# Patient Record
Sex: Male | Born: 1955 | Race: Black or African American | Hispanic: No | Marital: Single | Smoking: Current some day smoker
Health system: Southern US, Community
[De-identification: ages and names within clinical notes are randomized; demographics above are authoritative.]

## PROBLEM LIST (undated history)

## (undated) DIAGNOSIS — Z9989 Dependence on other enabling machines and devices: Secondary | ICD-10-CM

## (undated) DIAGNOSIS — F329 Major depressive disorder, single episode, unspecified: Secondary | ICD-10-CM

## (undated) DIAGNOSIS — I10 Essential (primary) hypertension: Secondary | ICD-10-CM

## (undated) DIAGNOSIS — C801 Malignant (primary) neoplasm, unspecified: Secondary | ICD-10-CM

## (undated) DIAGNOSIS — E785 Hyperlipidemia, unspecified: Secondary | ICD-10-CM

## (undated) DIAGNOSIS — R0600 Dyspnea, unspecified: Secondary | ICD-10-CM

## (undated) DIAGNOSIS — K759 Inflammatory liver disease, unspecified: Secondary | ICD-10-CM

## (undated) DIAGNOSIS — C3490 Malignant neoplasm of unspecified part of unspecified bronchus or lung: Secondary | ICD-10-CM

## (undated) DIAGNOSIS — M1990 Unspecified osteoarthritis, unspecified site: Secondary | ICD-10-CM

## (undated) DIAGNOSIS — F32A Depression, unspecified: Secondary | ICD-10-CM

## (undated) DIAGNOSIS — N189 Chronic kidney disease, unspecified: Secondary | ICD-10-CM

## (undated) DIAGNOSIS — K219 Gastro-esophageal reflux disease without esophagitis: Secondary | ICD-10-CM

## (undated) DIAGNOSIS — Z87442 Personal history of urinary calculi: Secondary | ICD-10-CM

## (undated) DIAGNOSIS — F4310 Post-traumatic stress disorder, unspecified: Secondary | ICD-10-CM

## (undated) DIAGNOSIS — G629 Polyneuropathy, unspecified: Secondary | ICD-10-CM

## (undated) DIAGNOSIS — G4733 Obstructive sleep apnea (adult) (pediatric): Secondary | ICD-10-CM

## (undated) DIAGNOSIS — E119 Type 2 diabetes mellitus without complications: Secondary | ICD-10-CM

## (undated) HISTORY — PX: COLONOSCOPY: SHX174

## (undated) HISTORY — DX: Essential (primary) hypertension: I10

## (undated) HISTORY — PX: OTHER SURGICAL HISTORY: SHX169

## (undated) HISTORY — DX: Type 2 diabetes mellitus without complications: E11.9

## (undated) HISTORY — DX: Chronic kidney disease, unspecified: N18.9

## (undated) HISTORY — DX: Obstructive sleep apnea (adult) (pediatric): Z99.89

## (undated) HISTORY — PX: URETERAL STENT PLACEMENT: SHX822

## (undated) HISTORY — DX: Obstructive sleep apnea (adult) (pediatric): G47.33

## (undated) HISTORY — DX: Hyperlipidemia, unspecified: E78.5

## (undated) NOTE — *Deleted (*Deleted)
[facility] OFFICE PROGRESS NOTE  [name], MD [address] [place]  [place] [address]  DIAGNOSIS: ***  PRIOR THERAPY:  CURRENT THERAPY:  INTERVAL HISTORY: Kevin Lambert. 18 y.o. male returns for *** regular *** visit for followup of ***   MEDICAL HISTORY: Past Medical History:  Diagnosis Date  . Arthritis   . Chronic kidney disease   . Depression   . DM (diabetes mellitus) ([place])    type II  . Dyslipidemia   . Dyspnea    unable to walk much - he thinks it is from   . GERD (gastroesophageal reflux disease)   . Hepatitis    Hepatitis C- treated  . History of kidney stones   . HTN (hypertension)   . Neuropathy   . nscl ca dx'd 10/2019  . OSA on CPAP   . PTSD (post-traumatic stress disorder)     ALLERGIES:  is allergic to metformin and related and naproxen.  MEDICATIONS:  Current Outpatient Medications  Medication Sig Dispense Refill  . albuterol (VENTOLIN HFA) 108 (90 Base) MCG/ACT inhaler Inhale 2 puffs into the lungs every 4 (four) hours as needed for wheezing or shortness of breath. 18 g 0  . amLODipine (NORVASC) 10 MG tablet Take 10 mg by mouth daily.    . Ascorbic Acid (VITAMIN C) 1000 MG tablet Take 1,000 mg by mouth daily.    Marland Kitchen aspirin EC 81 MG tablet Take 81 mg by mouth daily.    Marland Kitchen atorvastatin (LIPITOR) 10 MG tablet Take 10 mg by mouth daily.    . bisacodyl (DULCOLAX) 5 MG EC tablet Take 5 mg by mouth daily as needed for moderate constipation.    . cholecalciferol (VITAMIN D3) 25 MCG (1000 UNIT) tablet Take 1,000 Units by mouth daily.    . Cyanocobalamin (VITAMIN B 12 PO) Take 1 tablet by mouth daily.    . folic acid (FOLVITE) 1 MG tablet Take 1 tablet (1 mg total) by mouth daily. Pt needs to pick up today and start ASAP . He will pay out of pocket. He is waiting on the [facility] [place] to send his folic acid which will be next week. (Patient taking differently: Take 1 mg by mouth daily. ) 7 tablet 0  . furosemide (LASIX) 20 MG tablet Take 1 tablet (20  mg total) by mouth 2 (two) times daily. (Patient taking differently: Take 20 mg by mouth 2 (two) times daily. Pt takes once a week) 20 tablet 0  . glipiZIDE (GLUCOTROL) 5 MG tablet Take 5 mg by mouth daily before breakfast.     . lidocaine-prilocaine (EMLA) cream Apply to Port-A-Cath site 30-60-minute before treatment. 30 g 0  . magic mouthwash SOLN Take 5 mLs by mouth 4 (four) times daily as needed for mouth pain. Swish and swallow or spit 240 mL 2  . nicotine (NICODERM CQ) 21 mg/24hr patch Place 1 patch (21 mg total) onto the skin daily. 28 patch 3  . nicotine polacrilex (COMMIT) 4 MG lozenge Take 1 lozenge (4 mg total) by mouth as needed for smoking cessation. 108 tablet 3  . potassium chloride SA (KLOR-CON) 20 MEQ tablet Take 1 tablet (20 mEq total) by mouth daily. 10 tablet 0  . pregabalin (LYRICA) 25 MG capsule Take 1 capsule (25 mg total) by mouth daily. 30 capsule 1  . Propylene Glycol (SYSTANE BALANCE OP) Place 1 drop into both eyes daily.    Marland Kitchen triamterene-hydrochlorothiazide (MAXZIDE-25) 37.5-25 MG tablet Take 1 tablet by mouth daily.     Marland Kitchen  umeclidinium-vilanterol (ANORO ELLIPTA) 62.5-25 MCG/INH AEPB Inhale 1 puff into the lungs daily. 60 each 11  . umeclidinium-vilanterol (ANORO ELLIPTA) 62.5-25 MCG/INH AEPB Inhale 1 puff into the lungs daily. 14 each 0  . zinc gluconate 50 MG tablet Take 50 mg by mouth daily.     No current facility-administered medications for this visit.    SURGICAL HISTORY:  Past Surgical History:  Procedure Laterality Date  . BIOPSY OF MEDIASTINAL MASS  12/10/2019   Procedure: BIOPSY OF MEDIASTINAL MASS;  Surgeon: Garner Nash, DO;  Location: [facility];  Service: Pulmonary;;  right upper lobe  . BRONCHIAL NEEDLE ASPIRATION BIOPSY  12/10/2019   Procedure: BRONCHIAL NEEDLE ASPIRATION BIOPSIES;  Surgeon: Garner Nash, DO;  Location: [facility];  Service: Pulmonary;;  . COLONOSCOPY    . IR IMAGING GUIDED PORT INSERTION  12/31/2019  . kidney stone    .  URETERAL STENT PLACEMENT    . Ureteral Stent Removed    . VIDEO BRONCHOSCOPY WITH ENDOBRONCHIAL ULTRASOUND N/A 12/10/2019   Procedure: VIDEO BRONCHOSCOPY WITH ENDOBRONCHIAL ULTRASOUND;  Surgeon: Garner Nash, DO;  Location: [facility];  Service: Pulmonary;  Laterality: N/A;    REVIEW OF SYSTEMS:   Review of Systems  Constitutional: Negative for appetite change, chills, fatigue, fever and unexpected weight change.  HENT:   Negative for mouth sores, nosebleeds, sore throat and trouble swallowing.   Eyes: Negative for eye problems and icterus.  Respiratory: Negative for cough, hemoptysis, shortness of breath and wheezing.   Cardiovascular: Negative for chest pain and leg swelling.  Gastrointestinal: Negative for abdominal pain, constipation, diarrhea, nausea and vomiting.  Genitourinary: Negative for bladder incontinence, difficulty urinating, dysuria, frequency and hematuria.   Musculoskeletal: Negative for back pain, gait problem, neck pain and neck stiffness.  Skin: Negative for itching and rash.  Neurological: Negative for dizziness, extremity weakness, gait problem, headaches, light-headedness and seizures.  Hematological: Negative for adenopathy. Does not bruise/bleed easily.  Psychiatric/Behavioral: Negative for confusion, depression and sleep disturbance. The patient is not nervous/anxious.     PHYSICAL EXAMINATION:  There were no vitals taken for this visit.  ECOG PERFORMANCE STATUS: {CHL ONC ECOG [id number]  Physical Exam  Constitutional: Oriented to person, place, and time and well-developed, well-nourished, and in no distress. No distress.  HENT:  Head: Normocephalic and atraumatic.  Mouth/Throat: Oropharynx is clear and moist. No oropharyngeal exudate.  Eyes: Conjunctivae are normal. Right eye exhibits no discharge. Left eye exhibits no discharge. No scleral icterus.  Neck: Normal range of motion. Neck supple.  Cardiovascular: Normal rate, regular rhythm, normal  heart sounds and intact distal pulses.   Pulmonary/Chest: Effort normal and breath sounds normal. No respiratory distress. No wheezes. No rales.  Abdominal: Soft. Bowel sounds are normal. Exhibits no distension and no mass. There is no tenderness.  Musculoskeletal: Normal range of motion. Exhibits no edema.  Lymphadenopathy:    No cervical adenopathy.  Neurological: Alert and oriented to person, place, and time. Exhibits normal muscle tone. Gait normal. Coordination normal.  Skin: Skin is warm and dry. No rash noted. Not diaphoretic. No erythema. No pallor.  Psychiatric: Mood, memory and judgment normal.  Vitals reviewed.  LABORATORY DATA: Lab Results  Component Value Date   WBC 6.4 09/15/2020   HGB 10.4 (L) 09/15/2020   HCT 30.2 (L) 09/15/2020   MCV 102.4 (H) 09/15/2020   PLT 239 09/15/2020      Chemistry      Component Value Date/Time   NA 138 09/15/2020 1013  K 4.2 09/15/2020 1013   CL 105 09/15/2020 1013   CO2 27 09/15/2020 1013   BUN 30 (H) 09/15/2020 1013   CREATININE 2.24 (H) 09/15/2020 1013      Component Value Date/Time   CALCIUM 9.9 09/15/2020 1013   ALKPHOS 80 09/15/2020 1013   AST 21 09/15/2020 1013   ALT 25 09/15/2020 1013   BILITOT 0.3 09/15/2020 1013       RADIOGRAPHIC STUDIES:  CT Abdomen Pelvis Wo Contrast  Result Date: 09/11/2020 CLINICAL DATA:  Non-small cell lung cancer restaging. Chemotherapy and immunotherapy ongoing. EXAM: CT CHEST, ABDOMEN AND PELVIS WITHOUT CONTRAST TECHNIQUE: Multidetector CT imaging of the chest, abdomen and pelvis was performed following the standard protocol without IV contrast. COMPARISON:  07/08/2020 FINDINGS: CT CHEST FINDINGS Cardiovascular: Right Port-A-Cath tip: Cavoatrial junction. Coronary, aortic arch, and branch vessel atherosclerotic vascular disease. Mediastinum/Nodes: Indistinctly marginated right paratracheal lymph node 1.0 cm in short axis on image 19 of series 2, previously 1.2 cm by my measurements.  Lungs/Pleura: New moderate left and trace right pleural effusions with passive atelectasis. The right upper lobe mass abutting the major fissure measures 3.7 by 2.9 cm on image 54 series 6 when measured in the same fashion as on the prior exam, previously measuring 3.7 by 3.1 cm. Subjectively the lesion is unchanged in size and contour, and continues to obstruct in adjacent subsegmental bronchus posteriorly in the right upper lobe. There is new left lower lobe atelectasis along the major fissure. Stable small foci of pleural based thickening of the major fissure along the right middle lobe posteriorly for example on images 64-77 of series 6. Musculoskeletal: Thoracic spondylosis and degenerative disc disease. CT ABDOMEN PELVIS FINDINGS Hepatobiliary: Unremarkable noncontrast CT appearance. Pancreas: Unremarkable Spleen: Unremarkable Adrenals/Urinary Tract: 1.6 by 2.6 cm right adrenal nodule, precontrast density of 4 Hounsfield units, compatible with adenoma. 1.6 by 1.2 cm left adrenal nodule, internal density 17 Hounsfield units, nonspecific based on density. Neither of these nodules was hypermetabolic on prior PET-CT, and accordingly the appearance is likely benign. Vascular calcifications in both renal hila. Potential nonobstructive nephrolithiasis including a suspected 0.7 cm left kidney lower pole calculus on image 69 of series 2 which appears to have migrated from the upper pole. No hydronephrosis or hydroureter. Urinary bladder unremarkable. Increased bilateral perirenal stranding extending adjacent to the ureters as well. Stomach/Bowel: mild circumferential rectal wall thickening may be from nondistention. Otherwise unremarkable. Vascular/Lymphatic: Aortoiliac atherosclerotic vascular disease. No pathologic adenopathy is identified. Reproductive: The calcified vas deferens. Other: Mild nonspecific presacral edema. Musculoskeletal: Small direct right inguinal hernia containing adipose tissue. Small lipoma  along the left external oblique muscle posterolaterally on image 67 of series 2, unchanged. Lumbar spondylosis and degenerative disc disease causing impingement at L3-4 and L4-5. IMPRESSION: 1. Stable size and contour of the right upper lobe mass abutting the major fissure, with continued obstruction of adjacent subsegmental bronchus posteriorly in the right upper lobe. 2. New moderate left and trace right pleural effusions with passive atelectasis. 3. Increased bilateral perirenal stranding extending adjacent to the ureters as well as mild nonspecific presacral edema. 4. Other imaging findings of potential clinical significance: Coronary, aortic arch, and branch vessel atherosclerotic vascular disease. Right adrenal adenoma. Potential nonobstructive nephrolithiasis including a 0.7 cm left kidney lower pole calculus which appears to have migrated from the upper pole. Small direct right inguinal hernia containing adipose tissue. Lumbar spondylosis and degenerative disc disease causing impingement at L3-4 and L4-5. Mild nonspecific presacral edema. 5. Aortic atherosclerosis. Aortic Atherosclerosis (ICD10-I70.0).  Electronically Signed   By: Van Clines M.D.   On: 09/11/2020 16:22   CT Chest Wo Contrast  Result Date: 09/11/2020 CLINICAL DATA:  Non-small cell lung cancer restaging. Chemotherapy and immunotherapy ongoing. EXAM: CT CHEST, ABDOMEN AND PELVIS WITHOUT CONTRAST TECHNIQUE: Multidetector CT imaging of the chest, abdomen and pelvis was performed following the standard protocol without IV contrast. COMPARISON:  07/08/2020 FINDINGS: CT CHEST FINDINGS Cardiovascular: Right Port-A-Cath tip: Cavoatrial junction. Coronary, aortic arch, and branch vessel atherosclerotic vascular disease. Mediastinum/Nodes: Indistinctly marginated right paratracheal lymph node 1.0 cm in short axis on image 19 of series 2, previously 1.2 cm by my measurements. Lungs/Pleura: New moderate left and trace right pleural effusions  with passive atelectasis. The right upper lobe mass abutting the major fissure measures 3.7 by 2.9 cm on image 54 series 6 when measured in the same fashion as on the prior exam, previously measuring 3.7 by 3.1 cm. Subjectively the lesion is unchanged in size and contour, and continues to obstruct in adjacent subsegmental bronchus posteriorly in the right upper lobe. There is new left lower lobe atelectasis along the major fissure. Stable small foci of pleural based thickening of the major fissure along the right middle lobe posteriorly for example on images 64-77 of series 6. Musculoskeletal: Thoracic spondylosis and degenerative disc disease. CT ABDOMEN PELVIS FINDINGS Hepatobiliary: Unremarkable noncontrast CT appearance. Pancreas: Unremarkable Spleen: Unremarkable Adrenals/Urinary Tract: 1.6 by 2.6 cm right adrenal nodule, precontrast density of 4 Hounsfield units, compatible with adenoma. 1.6 by 1.2 cm left adrenal nodule, internal density 17 Hounsfield units, nonspecific based on density. Neither of these nodules was hypermetabolic on prior PET-CT, and accordingly the appearance is likely benign. Vascular calcifications in both renal hila. Potential nonobstructive nephrolithiasis including a suspected 0.7 cm left kidney lower pole calculus on image 69 of series 2 which appears to have migrated from the upper pole. No hydronephrosis or hydroureter. Urinary bladder unremarkable. Increased bilateral perirenal stranding extending adjacent to the ureters as well. Stomach/Bowel: mild circumferential rectal wall thickening may be from nondistention. Otherwise unremarkable. Vascular/Lymphatic: Aortoiliac atherosclerotic vascular disease. No pathologic adenopathy is identified. Reproductive: The calcified vas deferens. Other: Mild nonspecific presacral edema. Musculoskeletal: Small direct right inguinal hernia containing adipose tissue. Small lipoma along the left external oblique muscle posterolaterally on image 67 of  series 2, unchanged. Lumbar spondylosis and degenerative disc disease causing impingement at L3-4 and L4-5. IMPRESSION: 1. Stable size and contour of the right upper lobe mass abutting the major fissure, with continued obstruction of adjacent subsegmental bronchus posteriorly in the right upper lobe. 2. New moderate left and trace right pleural effusions with passive atelectasis. 3. Increased bilateral perirenal stranding extending adjacent to the ureters as well as mild nonspecific presacral edema. 4. Other imaging findings of potential clinical significance: Coronary, aortic arch, and branch vessel atherosclerotic vascular disease. Right adrenal adenoma. Potential nonobstructive nephrolithiasis including a 0.7 cm left kidney lower pole calculus which appears to have migrated from the upper pole. Small direct right inguinal hernia containing adipose tissue. Lumbar spondylosis and degenerative disc disease causing impingement at L3-4 and L4-5. Mild nonspecific presacral edema. 5. Aortic atherosclerosis. Aortic Atherosclerosis (ICD10-I70.0). Electronically Signed   By: Van Clines M.D.   On: 09/11/2020 16:22     ASSESSMENT/PLAN:  No problem-specific Assessment & Plan notes found for this encounter.   No orders of the defined types were placed in this encounter.    [place], PA-C 10/06/20

---
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
## 1993-11-07 DIAGNOSIS — G479 Sleep disorder, unspecified: Secondary | ICD-10-CM | POA: Insufficient documentation

## 2019-11-12 ENCOUNTER — Other Ambulatory Visit: Payer: Self-pay | Admitting: Student

## 2019-11-12 ENCOUNTER — Other Ambulatory Visit: Payer: Self-pay | Admitting: Internal Medicine

## 2019-11-12 ENCOUNTER — Other Ambulatory Visit (HOSPITAL_COMMUNITY): Payer: Self-pay | Admitting: Student

## 2019-11-12 DIAGNOSIS — C3490 Malignant neoplasm of unspecified part of unspecified bronchus or lung: Secondary | ICD-10-CM

## 2019-11-22 ENCOUNTER — Telehealth: Payer: Self-pay | Admitting: Physician Assistant

## 2019-11-22 ENCOUNTER — Encounter (HOSPITAL_COMMUNITY): Payer: Non-veteran care

## 2019-11-22 ENCOUNTER — Encounter (HOSPITAL_COMMUNITY): Payer: Self-pay

## 2019-11-22 NOTE — Telephone Encounter (Signed)
Received a new patient referral from the [place] for lung cancer. Mr Busby has been cld and scheduled to see [name] on 1/19 at 1:30pm w/labs at 1pm. Pt aware to arrive 15 minutes early.

## 2019-11-25 ENCOUNTER — Other Ambulatory Visit: Payer: Self-pay | Admitting: Physician Assistant

## 2019-11-25 DIAGNOSIS — R918 Other nonspecific abnormal finding of lung field: Secondary | ICD-10-CM

## 2019-11-26 ENCOUNTER — Telehealth: Payer: Self-pay | Admitting: Physician Assistant

## 2019-11-26 ENCOUNTER — Inpatient Hospital Stay: Payer: Non-veteran care | Admitting: Physician Assistant

## 2019-11-26 ENCOUNTER — Inpatient Hospital Stay: Payer: Non-veteran care

## 2019-11-26 NOTE — Telephone Encounter (Signed)
Kevin Lambert has been cld and r/s to see [name] on 1/22 at 9am w/labs at 8:15am. Pt aware to arrive 15 minutes early.

## 2019-11-26 NOTE — Telephone Encounter (Signed)
Returned patient's phone call regarding rescheduling an appointment, left a voicemail. 

## 2019-11-29 ENCOUNTER — Inpatient Hospital Stay: Payer: Non-veteran care | Admitting: Physician Assistant

## 2019-11-29 ENCOUNTER — Inpatient Hospital Stay (HOSPITAL_BASED_OUTPATIENT_CLINIC_OR_DEPARTMENT_OTHER): Payer: No Typology Code available for payment source | Admitting: Internal Medicine

## 2019-11-29 ENCOUNTER — Inpatient Hospital Stay: Payer: Non-veteran care

## 2019-11-29 ENCOUNTER — Encounter: Payer: Self-pay | Admitting: Internal Medicine

## 2019-11-29 ENCOUNTER — Other Ambulatory Visit: Payer: Self-pay

## 2019-11-29 ENCOUNTER — Inpatient Hospital Stay: Payer: No Typology Code available for payment source | Attending: Internal Medicine

## 2019-11-29 DIAGNOSIS — R918 Other nonspecific abnormal finding of lung field: Secondary | ICD-10-CM

## 2019-11-29 DIAGNOSIS — I10 Essential (primary) hypertension: Secondary | ICD-10-CM | POA: Diagnosis not present

## 2019-11-29 DIAGNOSIS — C3490 Malignant neoplasm of unspecified part of unspecified bronchus or lung: Secondary | ICD-10-CM

## 2019-11-29 DIAGNOSIS — Z720 Tobacco use: Secondary | ICD-10-CM

## 2019-11-29 DIAGNOSIS — E1159 Type 2 diabetes mellitus with other circulatory complications: Secondary | ICD-10-CM | POA: Insufficient documentation

## 2019-11-29 DIAGNOSIS — C3491 Malignant neoplasm of unspecified part of right bronchus or lung: Secondary | ICD-10-CM | POA: Insufficient documentation

## 2019-11-29 DIAGNOSIS — E119 Type 2 diabetes mellitus without complications: Secondary | ICD-10-CM | POA: Insufficient documentation

## 2019-11-29 LAB — CBC WITH DIFFERENTIAL (CANCER CENTER ONLY)
Abs Immature Granulocytes: 0.03 10*3/uL (ref 0.00–0.07)
Basophils Absolute: 0 10*3/uL (ref 0.0–0.1)
Basophils Relative: 0 %
Eosinophils Absolute: 0.3 10*3/uL (ref 0.0–0.5)
Eosinophils Relative: 5 %
HCT: 39 % (ref 39.0–52.0)
Hemoglobin: 13.5 g/dL (ref 13.0–17.0)
Immature Granulocytes: 1 %
Lymphocytes Relative: 31 %
Lymphs Abs: 2.1 10*3/uL (ref 0.7–4.0)
MCH: 32 pg (ref 26.0–34.0)
MCHC: 34.6 g/dL (ref 30.0–36.0)
MCV: 92.4 fL (ref 80.0–100.0)
Monocytes Absolute: 0.7 10*3/uL (ref 0.1–1.0)
Monocytes Relative: 10 %
Neutro Abs: 3.5 10*3/uL (ref 1.7–7.7)
Neutrophils Relative %: 53 %
Platelet Count: 181 10*3/uL (ref 150–400)
RBC: 4.22 MIL/uL (ref 4.22–5.81)
RDW: 14.4 % (ref 11.5–15.5)
WBC Count: 6.6 10*3/uL (ref 4.0–10.5)
nRBC: 0 % (ref 0.0–0.2)

## 2019-11-29 LAB — CMP (CANCER CENTER ONLY)
ALT: 48 U/L — ABNORMAL HIGH (ref 0–44)
AST: 24 U/L (ref 15–41)
Albumin: 3.7 g/dL (ref 3.5–5.0)
Alkaline Phosphatase: 80 U/L (ref 38–126)
Anion gap: 8 (ref 5–15)
BUN: 17 mg/dL (ref 8–23)
CO2: 25 mmol/L (ref 22–32)
Calcium: 9.1 mg/dL (ref 8.9–10.3)
Chloride: 104 mmol/L (ref 98–111)
Creatinine: 0.8 mg/dL (ref 0.61–1.24)
GFR, Est AFR Am: >60 mL/min (ref >60)
GFR, Estimated: >60 mL/min (ref >60)
Glucose, Bld: 179 mg/dL — ABNORMAL HIGH (ref 70–99)
Potassium: 4.3 mmol/L (ref 3.5–5.1)
Sodium: 137 mmol/L (ref 135–145)
Total Bilirubin: 0.3 mg/dL (ref 0.3–1.2)
Total Protein: 6.6 g/dL (ref 6.5–8.1)

## 2019-11-29 NOTE — Progress Notes (Signed)
[facility] [phone]   [phone]  CONSULT NOTE  REFERRING PHYSICIAN: Dr. Danton Sewer  REASON FOR CONSULTATION:  64 years old African-American male with highly suspicious metastatic lung cancer.  HPI Kevin Lambert is a 64 y.o. male with past medical history significant for diabetes mellitus, hypertension, COVID-19 infection in December 2020 as well as suspicious dyslipidemia and long history of smoking.  The patient mentioned that he has been complaining of cough for 3-4 months but in December 2020 he started having some hemoptysis.  He was seen by his primary care physician and chest x-ray followed by CT scan of the chest on October 21, 2019 showed ill-defined consolidative appearing lesion in the posterior inferior aspect of the right upper lobe suspected to represent a mass that extended into the superior segment of the right lower lobe with associated hilar/mediastinal adenopathy as well as scattered bilateral pulmonary nodules.  There was associated findings of an abrupt bronchial cutoff, adenopathy.  There was also partially imaged 2.0 cm right adrenal hypodensity suggestive of an adenoma. The patient was referred to me today for evaluation and recommendation regarding his condition.  He did not have a tissue diagnosis yet.  He was supposed to have a PET scan but this was delayed because the patient did not show up for his appointment as he was transferring his care to [facility]. When seen today he continues to have dry cough with occasional mild hemoptysis and shortness of breath at baseline increased with exertion but no significant chest pain.  He has weight loss of around 11 pounds in 1 months.  He denied having any night sweats.  The patient denied having any current nausea, vomiting, diarrhea but has constipation.  He has no headache or visual changes. Family history significant for mother died from liver disease and father had diabetes and  dyslipidemia. The patient is single and has no children.  He currently lives in [place].  He works at [employer] as a Psychologist, counselling.  He used to be a Geophysicist/field seismologist.  He has a history of smoking cigar for around 50 years and unfortunately he continues to smoke.  He also drank 10 ounces of whiskey every week.  He has no history of drug abuse.  HPI  Past Medical History:  Diagnosis Date   DM (diabetes mellitus) ([place])    Dyslipidemia    HTN (hypertension)      Family History  Problem Relation Age of Onset   Liver disease Mother    Diabetes Father     Social History Social History   Tobacco Use   Smoking status: Current Every Day Smoker    Packs/day: 0.50    Years: 50.00    Pack years: 25.00    Types: Cigarettes   Smokeless tobacco: Never Used  Substance Use Topics   Alcohol use: Yes    Alcohol/week: 10.0 standard drinks    Types: 10 Shots of liquor per week   Drug use: Never    Allergies  Allergen Reactions   Metformin And Related Other (See Comments)    "severe Muscle spasms"   Oxycontin [Oxycodone Hcl]     ([facility]) He does not know what happens    Current Outpatient Medications  Medication Sig Dispense Refill   amLODipine (NORVASC) 10 MG tablet Take 10 mg by mouth daily.     atorvastatin (LIPITOR) 10 MG tablet Take 10 mg by mouth daily.     glipiZIDE (GLUCOTROL) 5 MG tablet  Take by mouth daily before breakfast.     Menthol ([employer] LOZENGES MT) Use as directed in the mouth or throat.     aspirin EC 81 MG tablet Take 81 mg by mouth daily.     LORazepam (ATIVAN) 0.5 MG tablet Take 0.5 mg by mouth as directed.     sildenafil (VIAGRA) 100 MG tablet Take 100 mg by mouth daily as needed for erectile dysfunction.     No current facility-administered medications for this visit.    Review of Systems  Constitutional: positive for fatigue and weight loss Eyes: negative Ears, nose, mouth, throat, and face: negative Respiratory: positive for cough and  dyspnea on exertion Cardiovascular: negative Gastrointestinal: negative Genitourinary:negative Integument/breast: negative Hematologic/lymphatic: negative Musculoskeletal:negative Neurological: negative Behavioral/Psych: negative Endocrine: negative Allergic/Immunologic: negative   Physical Exam  OEU:MPNTI, healthy, no distress, well nourished, well developed and anxious SKIN: skin color, texture, turgor are normal, no rashes or significant lesions HEAD: Normocephalic, No masses, lesions, tenderness or abnormalities EYES: normal, PERRLA, Conjunctiva are pink and non-injected EARS: External ears normal, Canals clear OROPHARYNX:no exudate, no erythema and lips, buccal mucosa, and tongue normal  NECK: supple, no adenopathy, no JVD LYMPH:  no palpable lymphadenopathy, no hepatosplenomegaly LUNGS: clear to auscultation , and palpation HEART: regular rate & rhythm, no murmurs and no gallops ABDOMEN:abdomen soft, non-tender, obese, normal bowel sounds and no masses or organomegaly BACK: No CVA tenderness, Range of motion is normal EXTREMITIES:no joint deformities, effusion, or inflammation, no edema  NEURO: alert & oriented x 3 with fluent speech, no focal motor/sensory deficits  PERFORMANCE STATUS: ECOG 1  LABORATORY DATA: Lab Results  Component Value Date   WBC 6.6 11/29/2019   HGB 13.5 11/29/2019   HCT 39.0 11/29/2019   MCV 92.4 11/29/2019   PLT 181 11/29/2019      Chemistry      Component Value Date/Time   NA 137 11/29/2019 1103   K 4.3 11/29/2019 1103   CL 104 11/29/2019 1103   CO2 25 11/29/2019 1103   BUN 17 11/29/2019 1103   CREATININE 0.80 11/29/2019 1103   No results found for: CALCIUM, ALKPHOS, AST, ALT, BILITOT     RADIOGRAPHIC STUDIES: No results found.  ASSESSMENT: This is a very pleasant 64 years old African-American male with highly suspicious metastatic lung cancer likely non-small cell carcinoma pending tissue diagnosis and presented with large  consolidative right upper lobe lung mass with extension to the right lower lobe as well as right hilar and mediastinal lymphadenopathy and bilateral pulmonary nodules as well as questionable left adrenal lesion.   PLAN: I had a lengthy discussion with the patient today about his current condition and further investigation to confirm diagnosis as well as possible treatment options. I recommended for the patient to complete the staging work-up by having a PET scan as well as MRI of the brain to rule out any other metastatic disease. I will refer the patient to Dr. Valeta Harms with pulmonary medicine for consideration of bronchoscopy with endobronchial ultrasound and biopsy. I will arrange for the patient to come back for follow-up visit in less than 2 weeks for further evaluation and discussion of his treatment options based on the final pathology as well as the staging work-up. I strongly recommend for the patient to quit smoking. He was advised to call immediately if he has any other concerning symptoms in the interval. The patient voices understanding of current disease status and treatment options and is in agreement with the current care plan.  All  questions were answered. The patient knows to call the clinic with any problems, questions or concerns. We can certainly see the patient much sooner if necessary.  Thank you so much for allowing me to participate in the care of Delanna Notice. I will continue to follow up the patient with you and assist in his care.  The total time spent in the appointment was 60 minutes.  Disclaimer: This note was dictated with voice recognition software. Similar sounding words can inadvertently be transcribed and may not be corrected upon review.   Eilleen Kempf November 29, 2019, 11:14 AM

## 2019-11-29 NOTE — Patient Instructions (Signed)
Steps to Quit Smoking Smoking tobacco is the leading cause of preventable death. It can affect almost every organ in the body. Smoking puts you and people around you at risk for many serious, long-lasting (chronic) diseases. Quitting smoking can be hard, but it is one of the best things that you can do for your health. It is never too late to quit. How do I get ready to quit? When you decide to quit smoking, make a plan to help you succeed. Before you quit:  Pick a date to quit. Set a date within the next 2 weeks to give you time to prepare.  Write down the reasons why you are quitting. Keep this list in places where you will see it often.  Tell your family, friends, and co-workers that you are quitting. Their support is important.  Talk with your doctor about the choices that may help you quit.  Find out if your health insurance will pay for these treatments.  Know the people, places, things, and activities that make you want to smoke (triggers). Avoid them. What first steps can I take to quit smoking?  Throw away all cigarettes at home, at work, and in your car.  Throw away the things that you use when you smoke, such as ashtrays and lighters.  Clean your car. Make sure to empty the ashtray.  Clean your home, including curtains and carpets. What can I do to help me quit smoking? Talk with your doctor about taking medicines and seeing a counselor at the same time. You are more likely to succeed when you do both.  If you are pregnant or breastfeeding, talk with your doctor about counseling or other ways to quit smoking. Do not take medicine to help you quit smoking unless your doctor tells you to do so. To quit smoking: Quit right away  Quit smoking totally, instead of slowly cutting back on how much you smoke over a period of time.  Go to counseling. You are more likely to quit if you go to counseling sessions regularly. Take medicine You may take medicines to help you quit. Some  medicines need a prescription, and some you can buy over-the-counter. Some medicines may contain a drug called nicotine to replace the nicotine in cigarettes. Medicines may:  Help you to stop having the desire to smoke (cravings).  Help to stop the problems that come when you stop smoking (withdrawal symptoms). Your doctor may ask you to use:  Nicotine patches, gum, or lozenges.  Nicotine inhalers or sprays.  Non-nicotine medicine that is taken by mouth. Find resources Find resources and other ways to help you quit smoking and remain smoke-free after you quit. These resources are most helpful when you use them often. They include:  Online chats with a counselor.  Phone quitlines.  Printed self-help materials.  Support groups or group counseling.  Text messaging programs.  Mobile phone apps. Use apps on your mobile phone or tablet that can help you stick to your quit plan. There are many free apps for mobile phones and tablets as well as websites. Examples include Quit Guide from the CDC and smokefree.gov  What things can I do to make it easier to quit?   Talk to your family and friends. Ask them to support and encourage you.  Call a phone quitline (1-800-QUIT-NOW), reach out to support groups, or work with a counselor.  Ask people who smoke to not smoke around you.  Avoid places that make you want to smoke,   such as: ? Bars. ? Parties. ? Smoke-break areas at work.  Spend time with people who do not smoke.  Lower the stress in your life. Stress can make you want to smoke. Try these things to help your stress: ? Getting regular exercise. ? Doing deep-breathing exercises. ? Doing yoga. ? Meditating. ? Doing a body scan. To do this, close your eyes, focus on one area of your body at a time from head to toe. Notice which parts of your body are tense. Try to relax the muscles in those areas. How will I feel when I quit smoking? Day 1 to 3 weeks Within the first 24 hours,  you may start to have some problems that come from quitting tobacco. These problems are very bad 2-3 days after you quit, but they do not often last for more than 2-3 weeks. You may get these symptoms:  Mood swings.  Feeling restless, nervous, angry, or annoyed.  Trouble concentrating.  Dizziness.  Strong desire for high-sugar foods and nicotine.  Weight gain.  Trouble pooping (constipation).  Feeling like you may vomit (nausea).  Coughing or a sore throat.  Changes in how the medicines that you take for other issues work in your body.  Depression.  Trouble sleeping (insomnia). Week 3 and afterward After the first 2-3 weeks of quitting, you may start to notice more positive results, such as:  Better sense of smell and taste.  Less coughing and sore throat.  Slower heart rate.  Lower blood pressure.  Clearer skin.  Better breathing.  Fewer sick days. Quitting smoking can be hard. Do not give up if you fail the first time. Some people need to try a few times before they succeed. Do your best to stick to your quit plan, and talk with your doctor if you have any questions or concerns. Summary  Smoking tobacco is the leading cause of preventable death. Quitting smoking can be hard, but it is one of the best things that you can do for your health.  When you decide to quit smoking, make a plan to help you succeed.  Quit smoking right away, not slowly over a period of time.  When you start quitting, seek help from your doctor, family, or friends. This information is not intended to replace advice given to you by your health care provider. Make sure you discuss any questions you have with your health care provider. Document Revised: 07/19/2019 Document Reviewed: 01/12/2019 Elsevier Patient Education  2020 [employer].  

## 2019-12-02 ENCOUNTER — Telehealth: Payer: Self-pay

## 2019-12-02 NOTE — Telephone Encounter (Signed)
Spoke with patient. He has been scheduled for 1030 on 1/27 with Dr. Valeta Harms. He is aware of our address and phone number.   Nothing further needed at time of call.

## 2019-12-02 NOTE — Telephone Encounter (Signed)
-----   Message from Garner Nash, DO sent at 11/29/2019 12:01 PM EST ----- Regarding: Please schedule 27th This is a new consult. Coming from oncology. Please schedule with me on the 27th.   30 min slot preferred but we need to work in that's day. If I have a lung mass slot available that would be good. Either way ok to add in.   Thanks,  Kevin Lambert

## 2019-12-04 ENCOUNTER — Ambulatory Visit (INDEPENDENT_AMBULATORY_CARE_PROVIDER_SITE_OTHER): Payer: No Typology Code available for payment source | Admitting: Pulmonary Disease

## 2019-12-04 ENCOUNTER — Encounter: Payer: Self-pay | Admitting: Pulmonary Disease

## 2019-12-04 ENCOUNTER — Other Ambulatory Visit: Payer: Self-pay

## 2019-12-04 VITALS — BP 120/74 | HR 93 | Ht 69.0 in | Wt 252.8 lb

## 2019-12-04 DIAGNOSIS — R590 Localized enlarged lymph nodes: Secondary | ICD-10-CM | POA: Diagnosis not present

## 2019-12-04 DIAGNOSIS — C3490 Malignant neoplasm of unspecified part of unspecified bronchus or lung: Secondary | ICD-10-CM

## 2019-12-04 DIAGNOSIS — Z720 Tobacco use: Secondary | ICD-10-CM | POA: Diagnosis not present

## 2019-12-04 DIAGNOSIS — R918 Other nonspecific abnormal finding of lung field: Secondary | ICD-10-CM

## 2019-12-04 NOTE — H&P (View-Only) (Signed)
Synopsis: Referred in January 2021 for abnormal CT imaging by [name], MD  Subjective:   PATIENT ID: Kevin Lambert. GENDER: male DOB: [birth year], MRN: [id number]  Chief Complaint  Patient presents with  . Consult    Pt was diagnosed with lung cancer Dec. 2020 and is here. Pt has complaints of cough with red phlegm, chest congestion, and also SOB which he has all the time.    This is a 64 year old gentleman referred to [place] from Dr. Lew Dawes office.  Patient with a past medical history of diabetes, hypertension, COVID-19 infection in December 2020.  He has a longstanding history of smoking.  Patient had ongoing complaints of cough for greater than 4 months.  Additionally having hemoptysis.  Patient had chest imaging followed by x-ray then CT imaging of the chest on October 21, 2019 which showed a ill-defined consolidative mass within the right upper lobe that extended into the superior segment of the right lower lobe and associated mediastinal and hilar adenopathy with scattered bilateral pulmonary nodules this was concerning for an advanced stage bronchogenic carcinoma.  Patient was referred to oncology for evaluation from the [facility] system.  Dr. Earlie Server has referred the patient to see me to establish care and obtain tissue diagnosis for potential underlying malignancy.  OV 12/03/2018: Today, patient seen today in the office.  No respiratory complaints.  No chest pain.  Denies hemoptysis at this time.  Very anxious about his current state.  Unfortunately still smoking half a pack per day.  No family history of lung cancer.  Patient states that he is ready to get this process started would like to have biopsy as soon as possible.   Past Medical History:  Diagnosis Date  . DM (diabetes mellitus) ([place])   . Dyslipidemia   . HTN (hypertension)   . OSA on CPAP      Family History  Problem Relation Age of Onset  . Liver disease Mother   . Diabetes Father      Past Surgical  History:  Procedure Laterality Date  . kidney stone    . URETERAL STENT PLACEMENT      Social History   Socioeconomic History  . Marital status: Single    Spouse name: Not on file  . Number of children: Not on file  . Years of education: Not on file  . Highest education level: Not on file  Occupational History  . Not on file  Tobacco Use  . Smoking status: Current Every Day Smoker    Packs/day: 0.50    Years: 50.00    Pack years: 25.00    Types: Cigarettes  . Smokeless tobacco: Never Used  Substance and Sexual Activity  . Alcohol use: Yes    Alcohol/week: 10.0 standard drinks    Types: 10 Shots of liquor per week  . Drug use: Never  . Sexual activity: Not on file  Other Topics Concern  . Not on file  Social History Narrative  . Not on file   Social Determinants of Health   Financial Resource Strain:   . Difficulty of Paying Living Expenses: Not on file  Food Insecurity:   . Worried About Charity fundraiser in the Last Year: Not on file  . Ran Out of Food in the Last Year: Not on file  Transportation Needs:   . Lack of Transportation (Medical): Not on file  . Lack of Transportation (Non-Medical): Not on file  Physical Activity:   . Days of Exercise  per Week: Not on file  . Minutes of Exercise per Session: Not on file  Stress:   . Feeling of Stress : Not on file  Social Connections:   . Frequency of Communication with Friends and Family: Not on file  . Frequency of Social Gatherings with Friends and Family: Not on file  . Attends Religious Services: Not on file  . Active Member of Clubs or Organizations: Not on file  . Attends Archivist Meetings: Not on file  . Marital Status: Not on file  Intimate Partner Violence:   . Fear of Current or Ex-Partner: Not on file  . Emotionally Abused: Not on file  . Physically Abused: Not on file  . Sexually Abused: Not on file     Allergies  Allergen Reactions  . Metformin And Related Other (See Comments)     "severe Muscle spasms"     Outpatient Medications Prior to Visit  Medication Sig Dispense Refill  . amLODipine (NORVASC) 10 MG tablet Take 10 mg by mouth daily.    Marland Kitchen aspirin EC 81 MG tablet Take 81 mg by mouth daily.    Marland Kitchen atorvastatin (LIPITOR) 10 MG tablet Take 10 mg by mouth daily.    Marland Kitchen glipiZIDE (GLUCOTROL) 5 MG tablet Take by mouth daily before breakfast.    . ibuprofen (ADVIL) 200 MG tablet Take 200 mg by mouth every 6 (six) hours as needed.    Marland Kitchen LORazepam (ATIVAN) 0.5 MG tablet Take 0.5 mg by mouth as directed.    . Menthol ([employer] LOZENGES MT) Use as directed in the mouth or throat.    . sildenafil (VIAGRA) 100 MG tablet Take 100 mg by mouth daily as needed for erectile dysfunction.     No facility-administered medications prior to visit.    Review of Systems  Constitutional: Negative for chills, fever, malaise/fatigue and weight loss.  HENT: Negative for hearing loss, sore throat and tinnitus.   Eyes: Negative for blurred vision and double vision.  Respiratory: Positive for cough, sputum production and shortness of breath. Negative for hemoptysis, wheezing and stridor.   Cardiovascular: Negative for chest pain, palpitations, orthopnea, leg swelling and PND.  Gastrointestinal: Negative for abdominal pain, constipation, diarrhea, heartburn, nausea and vomiting.  Genitourinary: Negative for dysuria, hematuria and urgency.  Musculoskeletal: Negative for joint pain and myalgias.  Skin: Negative for itching and rash.  Neurological: Negative for dizziness, tingling, weakness and headaches.  Endo/Heme/Allergies: Negative for environmental allergies. Does not bruise/bleed easily.  Psychiatric/Behavioral: Negative for depression. The patient is not nervous/anxious and does not have insomnia.   All other systems reviewed and are negative.    Objective:  Physical Exam Vitals reviewed.  Constitutional:      General: He is not in acute distress.    Appearance: He is well-developed. He  is obese.  HENT:     Head: Normocephalic and atraumatic.  Eyes:     General: No scleral icterus.    Conjunctiva/sclera: Conjunctivae normal.     Pupils: Pupils are equal, round, and reactive to light.  Neck:     Vascular: No JVD.     Trachea: No tracheal deviation.  Cardiovascular:     Rate and Rhythm: Normal rate and regular rhythm.     Heart sounds: Normal heart sounds. No murmur.  Pulmonary:     Effort: Pulmonary effort is normal. No tachypnea, accessory muscle usage or respiratory distress.     Breath sounds: Normal breath sounds. No stridor. No wheezing, rhonchi or rales.  Abdominal:     Comments: Obese  Musculoskeletal:        General: No tenderness.     Cervical back: Neck supple.  Lymphadenopathy:     Cervical: No cervical adenopathy.  Skin:    General: Skin is warm and dry.     Capillary Refill: Capillary refill takes less than 2 seconds.     Findings: No rash.  Neurological:     Mental Status: He is alert and oriented to person, place, and time.  Psychiatric:        Behavior: Behavior normal.      Vitals:   12/04/19 1022  BP: 120/74  Pulse: 93  SpO2: 95%  Weight: 252 lb 12.8 oz (114.7 kg)  Height: 5\' 9"  (1.753 m)   95% on RA BMI Readings from Last 3 Encounters:  12/04/19 37.33 kg/m  11/29/19 36.52 kg/m   Wt Readings from Last 3 Encounters:  12/04/19 252 lb 12.8 oz (114.7 kg)  11/29/19 247 lb 4.8 oz (112.2 kg)    Chest Imaging: October 21, 2019: Chest CT, right upper lobe mass, mediastinal hilar adenopathy. The patient's images have been independently reviewed by me.    Pulmonary Functions Testing Results: No flowsheet data found.     Assessment & Plan:     ICD-10-CM   1. Lung mass  R91.8 Ambulatory referral to Pulmonology  2. Malignant neoplasm of unspecified part of unspecified bronchus or lung ([place])  C34.90 NM PET Image Initial (PI) Skull Base To Thigh    Ambulatory referral to Pulmonology  3. Mediastinal adenopathy  R59.0 Ambulatory  referral to Pulmonology  4. Hilar adenopathy  R59.0 Ambulatory referral to Pulmonology  5. Multiple pulmonary nodules  R91.8   6. Tobacco abuse  Z72.0     Assessment:  This is a 64 year old longstanding history of smoking, abnormal CT imaging concerning for lung mass with associated mediastinal and hilar adenopathy.  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  Patient referred for establish care and plans for tissue diagnosis via video bronchoscopy with endobronchial ultrasound and transbronchial needle aspiration biopsies.  Plan Following Extensive Data Review & Interpretation:  . I reviewed prior external note(s) from Dr. Lew Dawes office, [place], 11/29/2019. . I reviewed the result(s) of CBC, CMP 11/29/2019 . I have ordered stat nuclear medicine pet imaging.  Hopefully this can be completed as soon as possible.  We will also request disc copies of CT imaging completed at the [facility] hospital.  Plans to set up patient for bronchoscopy.  Today in the office we discussed risk benefits and alternatives of proceeding with invasive procedure for tissue diagnosis.  This would include video bronchoscopy with endobronchial ultrasound and transbronchial needle aspiration tissue biopsies.  Patient is agreeable to proceed.  This can hopefully be scheduled for December 10, 2019 7:30AM   I have discussed the case and plans for tissue biopsy and management management with Dr. Earlie Server from [facility].   Garner Nash, DO [facility] 12/04/2019 10:49 AM

## 2019-12-04 NOTE — Patient Instructions (Signed)
Thank you for visiting Dr. Valeta Harms at [facility]. Today we recommend the following:  Orders Placed This Encounter  Procedures  . NM PET Image Initial (PI) Skull Base To Thigh  . Ambulatory referral to Pulmonology    Return in about 4 weeks (around 01/01/2020).    Please do your part to reduce the spread of COVID-19.

## 2019-12-04 NOTE — Progress Notes (Signed)
Synopsis: Referred in January 2021 for abnormal CT imaging by [name], MD  Subjective:   PATIENT ID: Kevin Lambert. GENDER: male DOB: 1956/09/28, MRN: [id number]  Chief Complaint  Patient presents with  . Consult    Pt was diagnosed with lung cancer Dec. 2020 and is here. Pt has complaints of cough with red phlegm, chest congestion, and also SOB which he has all the time.    This is a 64 year old gentleman referred to [place] from Dr. Lew Dawes office.  Patient with a past medical history of diabetes, hypertension, COVID-19 infection in December 2020.  He has a longstanding history of smoking.  Patient had ongoing complaints of cough for greater than 4 months.  Additionally having hemoptysis.  Patient had chest imaging followed by x-ray then CT imaging of the chest on October 21, 2019 which showed a ill-defined consolidative mass within the right upper lobe that extended into the superior segment of the right lower lobe and associated mediastinal and hilar adenopathy with scattered bilateral pulmonary nodules this was concerning for an advanced stage bronchogenic carcinoma.  Patient was referred to oncology for evaluation from the [facility] system.  Dr. Earlie Server has referred the patient to see me to establish care and obtain tissue diagnosis for potential underlying malignancy.  OV 12/03/2018: Today, patient seen today in the office.  No respiratory complaints.  No chest pain.  Denies hemoptysis at this time.  Very anxious about his current state.  Unfortunately still smoking half a pack per day.  No family history of lung cancer.  Patient states that he is ready to get this process started would like to have biopsy as soon as possible.   Past Medical History:  Diagnosis Date  . DM (diabetes mellitus) ([place])   . Dyslipidemia   . HTN (hypertension)   . OSA on CPAP      Family History  Problem Relation Age of Onset  . Liver disease Mother   . Diabetes Father      Past Surgical  History:  Procedure Laterality Date  . kidney stone    . URETERAL STENT PLACEMENT      Social History   Socioeconomic History  . Marital status: Single    Spouse name: Not on file  . Number of children: Not on file  . Years of education: Not on file  . Highest education level: Not on file  Occupational History  . Not on file  Tobacco Use  . Smoking status: Current Every Day Smoker    Packs/day: 0.50    Years: 50.00    Pack years: 25.00    Types: Cigarettes  . Smokeless tobacco: Never Used  Substance and Sexual Activity  . Alcohol use: Yes    Alcohol/week: 10.0 standard drinks    Types: 10 Shots of liquor per week  . Drug use: Never  . Sexual activity: Not on file  Other Topics Concern  . Not on file  Social History Narrative  . Not on file   Social Determinants of Health   Financial Resource Strain:   . Difficulty of Paying Living Expenses: Not on file  Food Insecurity:   . Worried About Charity fundraiser in the Last Year: Not on file  . Ran Out of Food in the Last Year: Not on file  Transportation Needs:   . Lack of Transportation (Medical): Not on file  . Lack of Transportation (Non-Medical): Not on file  Physical Activity:   . Days of Exercise  per Week: Not on file  . Minutes of Exercise per Session: Not on file  Stress:   . Feeling of Stress : Not on file  Social Connections:   . Frequency of Communication with Friends and Family: Not on file  . Frequency of Social Gatherings with Friends and Family: Not on file  . Attends Religious Services: Not on file  . Active Member of Clubs or Organizations: Not on file  . Attends Archivist Meetings: Not on file  . Marital Status: Not on file  Intimate Partner Violence:   . Fear of Current or Ex-Partner: Not on file  . Emotionally Abused: Not on file  . Physically Abused: Not on file  . Sexually Abused: Not on file     Allergies  Allergen Reactions  . Metformin And Related Other (See Comments)     "severe Muscle spasms"     Outpatient Medications Prior to Visit  Medication Sig Dispense Refill  . amLODipine (NORVASC) 10 MG tablet Take 10 mg by mouth daily.    Marland Kitchen aspirin EC 81 MG tablet Take 81 mg by mouth daily.    Marland Kitchen atorvastatin (LIPITOR) 10 MG tablet Take 10 mg by mouth daily.    Marland Kitchen glipiZIDE (GLUCOTROL) 5 MG tablet Take by mouth daily before breakfast.    . ibuprofen (ADVIL) 200 MG tablet Take 200 mg by mouth every 6 (six) hours as needed.    Marland Kitchen LORazepam (ATIVAN) 0.5 MG tablet Take 0.5 mg by mouth as directed.    . Menthol ([employer] LOZENGES MT) Use as directed in the mouth or throat.    . sildenafil (VIAGRA) 100 MG tablet Take 100 mg by mouth daily as needed for erectile dysfunction.     No facility-administered medications prior to visit.    Review of Systems  Constitutional: Negative for chills, fever, malaise/fatigue and weight loss.  HENT: Negative for hearing loss, sore throat and tinnitus.   Eyes: Negative for blurred vision and double vision.  Respiratory: Positive for cough, sputum production and shortness of breath. Negative for hemoptysis, wheezing and stridor.   Cardiovascular: Negative for chest pain, palpitations, orthopnea, leg swelling and PND.  Gastrointestinal: Negative for abdominal pain, constipation, diarrhea, heartburn, nausea and vomiting.  Genitourinary: Negative for dysuria, hematuria and urgency.  Musculoskeletal: Negative for joint pain and myalgias.  Skin: Negative for itching and rash.  Neurological: Negative for dizziness, tingling, weakness and headaches.  Endo/Heme/Allergies: Negative for environmental allergies. Does not bruise/bleed easily.  Psychiatric/Behavioral: Negative for depression. The patient is not nervous/anxious and does not have insomnia.   All other systems reviewed and are negative.    Objective:  Physical Exam Vitals reviewed.  Constitutional:      General: He is not in acute distress.    Appearance: He is well-developed. He  is obese.  HENT:     Head: Normocephalic and atraumatic.  Eyes:     General: No scleral icterus.    Conjunctiva/sclera: Conjunctivae normal.     Pupils: Pupils are equal, round, and reactive to light.  Neck:     Vascular: No JVD.     Trachea: No tracheal deviation.  Cardiovascular:     Rate and Rhythm: Normal rate and regular rhythm.     Heart sounds: Normal heart sounds. No murmur.  Pulmonary:     Effort: Pulmonary effort is normal. No tachypnea, accessory muscle usage or respiratory distress.     Breath sounds: Normal breath sounds. No stridor. No wheezing, rhonchi or rales.  Abdominal:     Comments: Obese  Musculoskeletal:        General: No tenderness.     Cervical back: Neck supple.  Lymphadenopathy:     Cervical: No cervical adenopathy.  Skin:    General: Skin is warm and dry.     Capillary Refill: Capillary refill takes less than 2 seconds.     Findings: No rash.  Neurological:     Mental Status: He is alert and oriented to person, place, and time.  Psychiatric:        Behavior: Behavior normal.      Vitals:   12/04/19 1022  BP: 120/74  Pulse: 93  SpO2: 95%  Weight: 252 lb 12.8 oz (114.7 kg)  Height: 5\' 9"  (1.753 m)   95% on RA BMI Readings from Last 3 Encounters:  12/04/19 37.33 kg/m  11/29/19 36.52 kg/m   Wt Readings from Last 3 Encounters:  12/04/19 252 lb 12.8 oz (114.7 kg)  11/29/19 247 lb 4.8 oz (112.2 kg)    Chest Imaging: October 21, 2019: Chest CT, right upper lobe mass, mediastinal hilar adenopathy. The patient's images have been independently reviewed by me.    Pulmonary Functions Testing Results: No flowsheet data found.     Assessment & Plan:     ICD-10-CM   1. Lung mass  R91.8 Ambulatory referral to Pulmonology  2. Malignant neoplasm of unspecified part of unspecified bronchus or lung ([place])  C34.90 NM PET Image Initial (PI) Skull Base To Thigh    Ambulatory referral to Pulmonology  3. Mediastinal adenopathy  R59.0 Ambulatory  referral to Pulmonology  4. Hilar adenopathy  R59.0 Ambulatory referral to Pulmonology  5. Multiple pulmonary nodules  R91.8   6. Tobacco abuse  Z72.0     Assessment:  This is a 64 year old longstanding history of smoking, abnormal CT imaging concerning for lung mass with associated mediastinal and hilar adenopathy.  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  Patient referred for establish care and plans for tissue diagnosis via video bronchoscopy with endobronchial ultrasound and transbronchial needle aspiration biopsies.  Plan Following Extensive Data Review & Interpretation:  . I reviewed prior external note(s) from Dr. Lew Dawes office, [place], 11/29/2019. . I reviewed the result(s) of CBC, CMP 11/29/2019 . I have ordered stat nuclear medicine pet imaging.  Hopefully this can be completed as soon as possible.  We will also request disc copies of CT imaging completed at the [facility] hospital.  Plans to set up patient for bronchoscopy.  Today in the office we discussed risk benefits and alternatives of proceeding with invasive procedure for tissue diagnosis.  This would include video bronchoscopy with endobronchial ultrasound and transbronchial needle aspiration tissue biopsies.  Patient is agreeable to proceed.  This can hopefully be scheduled for December 10, 2019 7:30AM   I have discussed the case and plans for tissue biopsy and management management with Dr. Earlie Server from [facility].   Garner Nash, DO [facility] 12/04/2019 10:49 AM

## 2019-12-05 ENCOUNTER — Ambulatory Visit (HOSPITAL_COMMUNITY)
Admission: RE | Admit: 2019-12-05 | Discharge: 2019-12-05 | Disposition: A | Discharge status: Home / Self Care | Payer: Non-veteran care | Source: Ambulatory Visit | Attending: Internal Medicine | Admitting: Internal Medicine

## 2019-12-05 ENCOUNTER — Telehealth: Payer: Self-pay | Admitting: Medical Oncology

## 2019-12-05 DIAGNOSIS — C3490 Malignant neoplasm of unspecified part of unspecified bronchus or lung: Secondary | ICD-10-CM

## 2019-12-05 NOTE — Telephone Encounter (Signed)
Pt called to say that the PET scan was cancelled and to please call him back, . I was unable to reach pt ,but left message to call back. I called MRI and staff said pt told  them he was "allergic to the contrast, he throws up ".  Pt needs to clarify if it is CT dye or MRI dye . I do not see contrast dye allergy on his MAR.

## 2019-12-06 ENCOUNTER — Telehealth: Payer: Self-pay

## 2019-12-06 ENCOUNTER — Encounter (HOSPITAL_COMMUNITY): Payer: Self-pay | Admitting: Pulmonary Disease

## 2019-12-06 ENCOUNTER — Other Ambulatory Visit: Payer: Self-pay

## 2019-12-06 NOTE — Telephone Encounter (Signed)
TC from Pt. Stating that he was scheduled  For MRI on 12/05/19 Pt stated test was not done because he informed tech that he gets nausea when he has oral contrast. Informed Pt. That Dr Julien Nordmann desk Nurse sent the prescription for ativan to his pharmacy Pt realized that he had the prescription, MRI appointment rescheduled for 12/11/19 and Pet scan appointment confirmed. Reiterated instructions for medication for Pt. Pt verbalized understanding.

## 2019-12-06 NOTE — Progress Notes (Addendum)
Mr.Barcus denies chest pain, Patient gets very short of breath walking a block and has to stop.  He has been short of breath for 4 years. Mr Quirk has type II diabetes, he takes Glipizide. I instructed patient to hold Glipizide the morning of surgery.  Patient  Does not check BCG, his machine is not working. Patient states that last A1C was 8 at the [place].  Mr. Ripberger has COPD, he has a CPAP, but hasn't used it in over 4 years and it needs to be updated.  Mr Ruan does not have anyone to stay with him over night Tuesday, transportation has been arranged. Patient has a sister, "but it probably too short notice." I told patient to call and see.  Mr Portilla will be at Dr Juline Patch office on Monday I will call Monday office Monday with information and they can check with patient.  [facility] I will see if Dr Juline Patch office can have patient sign a release and have patient  sigh and fax back to PAT to be signed.  I called Mr Schmieder to ask if he has someone to stay with him at home after surgery, he said yes , sister will stay.

## 2019-12-07 ENCOUNTER — Other Ambulatory Visit (HOSPITAL_COMMUNITY): Payer: Non-veteran care

## 2019-12-09 ENCOUNTER — Encounter (HOSPITAL_COMMUNITY): Payer: Self-pay

## 2019-12-09 ENCOUNTER — Emergency Department (HOSPITAL_COMMUNITY)
Admission: EM | Admit: 2019-12-09 | Discharge: 2019-12-09 | Disposition: A | Discharge status: Home / Self Care | Payer: No Typology Code available for payment source | Attending: Emergency Medicine | Admitting: Emergency Medicine

## 2019-12-09 ENCOUNTER — Encounter (HOSPITAL_COMMUNITY)
Admission: RE | Admit: 2019-12-09 | Discharge: 2019-12-09 | Disposition: A | Discharge status: Home / Self Care | Payer: No Typology Code available for payment source | Source: Ambulatory Visit | Attending: Pulmonary Disease | Admitting: Pulmonary Disease

## 2019-12-09 ENCOUNTER — Emergency Department (HOSPITAL_COMMUNITY): Payer: No Typology Code available for payment source

## 2019-12-09 ENCOUNTER — Other Ambulatory Visit: Payer: Self-pay

## 2019-12-09 DIAGNOSIS — I10 Essential (primary) hypertension: Secondary | ICD-10-CM | POA: Insufficient documentation

## 2019-12-09 DIAGNOSIS — K5900 Constipation, unspecified: Secondary | ICD-10-CM | POA: Insufficient documentation

## 2019-12-09 DIAGNOSIS — R0600 Dyspnea, unspecified: Secondary | ICD-10-CM | POA: Insufficient documentation

## 2019-12-09 DIAGNOSIS — R05 Cough: Secondary | ICD-10-CM | POA: Insufficient documentation

## 2019-12-09 DIAGNOSIS — Z20822 Contact with and (suspected) exposure to covid-19: Secondary | ICD-10-CM | POA: Insufficient documentation

## 2019-12-09 DIAGNOSIS — Z85118 Personal history of other malignant neoplasm of bronchus and lung: Secondary | ICD-10-CM | POA: Diagnosis not present

## 2019-12-09 DIAGNOSIS — R1032 Left lower quadrant pain: Secondary | ICD-10-CM | POA: Insufficient documentation

## 2019-12-09 DIAGNOSIS — C3490 Malignant neoplasm of unspecified part of unspecified bronchus or lung: Secondary | ICD-10-CM

## 2019-12-09 DIAGNOSIS — F17210 Nicotine dependence, cigarettes, uncomplicated: Secondary | ICD-10-CM | POA: Insufficient documentation

## 2019-12-09 DIAGNOSIS — E119 Type 2 diabetes mellitus without complications: Secondary | ICD-10-CM | POA: Diagnosis not present

## 2019-12-09 DIAGNOSIS — R109 Unspecified abdominal pain: Secondary | ICD-10-CM

## 2019-12-09 LAB — COMPREHENSIVE METABOLIC PANEL
ALT: 33 U/L (ref 0–44)
AST: 19 U/L (ref 15–41)
Albumin: 4 g/dL (ref 3.5–5.0)
Alkaline Phosphatase: 74 U/L (ref 38–126)
Anion gap: 10 (ref 5–15)
BUN: 18 mg/dL (ref 8–23)
CO2: 26 mmol/L (ref 22–32)
Calcium: 9.9 mg/dL (ref 8.9–10.3)
Chloride: 101 mmol/L (ref 98–111)
Creatinine, Ser: 0.78 mg/dL (ref 0.61–1.24)
GFR calc Af Amer: >60 mL/min (ref >60)
GFR calc non Af Amer: >60 mL/min (ref >60)
Glucose, Bld: 136 mg/dL — ABNORMAL HIGH (ref 70–99)
Potassium: 4.1 mmol/L (ref 3.5–5.1)
Sodium: 137 mmol/L (ref 135–145)
Total Bilirubin: 0.6 mg/dL (ref 0.3–1.2)
Total Protein: 7.2 g/dL (ref 6.5–8.1)

## 2019-12-09 LAB — CBC
HCT: 42.1 % (ref 39.0–52.0)
Hemoglobin: 14.3 g/dL (ref 13.0–17.0)
MCH: 32.6 pg (ref 26.0–34.0)
MCHC: 34 g/dL (ref 30.0–36.0)
MCV: 95.9 fL (ref 80.0–100.0)
Platelets: 208 10*3/uL (ref 150–400)
RBC: 4.39 MIL/uL (ref 4.22–5.81)
RDW: 15 % (ref 11.5–15.5)
WBC: 8.3 10*3/uL (ref 4.0–10.5)
nRBC: 0 % (ref 0.0–0.2)

## 2019-12-09 LAB — URINALYSIS, ROUTINE W REFLEX MICROSCOPIC
Bilirubin Urine: NEGATIVE
Glucose, UA: NEGATIVE mg/dL
Hgb urine dipstick: NEGATIVE
Ketones, ur: NEGATIVE mg/dL
Leukocytes,Ua: NEGATIVE
Nitrite: NEGATIVE
Protein, ur: NEGATIVE mg/dL
Specific Gravity, Urine: 1.015 (ref 1.005–1.030)
pH: 6 (ref 5.0–8.0)

## 2019-12-09 LAB — GLUCOSE, CAPILLARY: Glucose-Capillary: 155 mg/dL — ABNORMAL HIGH (ref 70–99)

## 2019-12-09 LAB — SARS CORONAVIRUS 2 (TAT 6-24 HRS): SARS Coronavirus 2: NEGATIVE

## 2019-12-09 MED ORDER — HYDROMORPHONE HCL 1 MG/ML IJ SOLN
1.0000 mg | Freq: Once | INTRAMUSCULAR | Status: AC
  Administered 2019-12-09: 1 mg via INTRAVENOUS
  Filled 2019-12-09: qty 1

## 2019-12-09 MED ORDER — DICYCLOMINE HCL 20 MG PO TABS: 20.0000 mg | ORAL_TABLET | Freq: Two times a day (BID) | ORAL | 0 refills | Status: DC

## 2019-12-09 MED ORDER — FLUDEOXYGLUCOSE F - 18 (FDG) INJECTION
12.5000 | Freq: Once | INTRAVENOUS | Status: AC | PRN
  Administered 2019-12-09: 10:00:00 12.5 via INTRAVENOUS

## 2019-12-09 MED FILL — DICYCLOMINE 20 MG TABLET: 20 | 10 days supply | Qty: 20 | Fill #0

## 2019-12-09 NOTE — Progress Notes (Addendum)
I spoke with Jan in PET at [facility], I am going to send a fax to her for Mr Dismore to sign to [facility] for [facility].  I checked to see if Mr Marcott had finished PET scan, I saw that patient is currently in the waiting area at [facility]. I called [facility] ED and spoke to [place], [place] and told her that we need a Covid test done for Mr Skare procedure 11/09/2019.  Ria Comment said she will put Covid Test in the computer

## 2019-12-09 NOTE — Progress Notes (Signed)
Pt not tested for covid due to him stating that he tested + for covid on 11/05/2019. Based on guidelines, he is in the 90- day window to not restest.

## 2019-12-09 NOTE — ED Provider Notes (Signed)
[facility] Emergency Department Provider Note MRN:  [id number]  Arrival date & time: 12/09/19     Chief Complaint   Flank Pain   History of Present Illness   Kevin Lambert. is a 64 y.o. year-old male with a history of diabetes, hypertension, lung cancer presenting to the ED with chief complaint of flank pain.  3 days of persistent left flank pain.  Explains that it feels somewhat similar to prior kidney stones but it is in a different location.  Kidney stone pain was present in the, this is located more on the lateral side of his flank.  Denies fever, no chest pain or shortness of breath.  Does have chronic cough and dyspnea exertion related to his new lung cancer diagnosis.  Denies lower abdominal pain, no dysuria, no hematuria.  Pain is currently 10 out of 10, constant, no exacerbating or alleviating factors.  Review of Systems  A complete 10 system review of systems was obtained and all systems are negative except as noted in the HPI and PMH.   Patient's Health History    Past Medical History:  Diagnosis Date  . Arthritis   . Depression   . DM (diabetes mellitus) ([place])    type II  . Dyslipidemia   . Dyspnea    unable to walk much - he thinks it is from   . GERD (gastroesophageal reflux disease)   . Hepatitis    Hepatitis C- treated  . History of kidney stones   . HTN (hypertension)   . Neuropathy   . OSA on CPAP   . PTSD (post-traumatic stress disorder)     Past Surgical History:  Procedure Laterality Date  . COLONOSCOPY    . kidney stone    . URETERAL STENT PLACEMENT    . Ureteral Stent Removed      Family History  Problem Relation Age of Onset  . Liver disease Mother   . Diabetes Father     Social History   Socioeconomic History  . Marital status: Single    Spouse name: Not on file  . Number of children: Not on file  . Years of education: Not on file  . Highest education level: Not on file  Occupational History  . Not on file    Tobacco Use  . Smoking status: Current Every Day Smoker    Packs/day: 0.60    Years: 50.00    Pack years: 30.00    Types: Cigarettes  . Smokeless tobacco: Never Used  Substance and Sexual Activity  . Alcohol use: Yes    Alcohol/week: 6.0 standard drinks    Types: 6 Cans of beer per week  . Drug use: Not Currently  . Sexual activity: Not on file  Other Topics Concern  . Not on file  Social History Narrative  . Not on file   Social Determinants of Health   Financial Resource Strain:   . Difficulty of Paying Living Expenses: Not on file  Food Insecurity:   . Worried About Charity fundraiser in the Last Year: Not on file  . Ran Out of Food in the Last Year: Not on file  Transportation Needs:   . Lack of Transportation (Medical): Not on file  . Lack of Transportation (Non-Medical): Not on file  Physical Activity:   . Days of Exercise per Week: Not on file  . Minutes of Exercise per Session: Not on file  Stress:   . Feeling of Stress : Not  on file  Social Connections:   . Frequency of Communication with Friends and Family: Not on file  . Frequency of Social Gatherings with Friends and Family: Not on file  . Attends Religious Services: Not on file  . Active Member of Clubs or Organizations: Not on file  . Attends Archivist Meetings: Not on file  . Marital Status: Not on file  Intimate Partner Violence:   . Fear of Current or Ex-Partner: Not on file  . Emotionally Abused: Not on file  . Physically Abused: Not on file  . Sexually Abused: Not on file     Physical Exam   Vitals:   12/09/19 1415 12/09/19 1500  BP: (!) 197/82 (!) 178/69  Pulse: 74 65  Resp: 18 18  Temp:    SpO2: 98% 91%    CONSTITUTIONAL: Well-appearing, NAD NEURO:  Alert and oriented x 3, no focal deficits EYES:  eyes equal and reactive ENT/NECK:  no LAD, no JVD CARDIO: Regular rate, well-perfused, normal S1 and S2 PULM:  CTAB no wheezing or rhonchi GI/GU:  normal bowel sounds,  non-distended, non-tender MSK/SPINE:  No gross deformities, no edema SKIN:  no rash, atraumatic PSYCH:  Appropriate speech and behavior  *Additional and/or pertinent findings included in MDM below  Diagnostic and Interventional Summary    EKG Interpretation  Date/Time:  Monday December 09 2019 13:52:46 EST Ventricular Rate:  59 PR Interval:  150 QRS Duration: 92 QT Interval:  444 QTC Calculation: 439 R Axis:   66 Text Interpretation: Sinus bradycardia with marked sinus arrhythmia Cannot rule out Anterior infarct , age undetermined Abnormal ECG No previous ECGs available Confirmed by Gerlene Fee [phone]) on 12/09/2019 3:52:53 PM      Cardiac Monitoring Interpretation:  Labs Reviewed  COMPREHENSIVE METABOLIC PANEL - Abnormal; Notable for the following components:      Result Value   Glucose, Bld 136 (*)    All other components within normal limits  SARS CORONAVIRUS 2 (TAT 6-24 HRS)  URINALYSIS, ROUTINE W REFLEX MICROSCOPIC  CBC    CT RENAL STONE STUDY  Final Result      Medications  HYDROmorphone (DILAUDID) injection 1 mg (1 mg Intravenous Given 12/09/19 1333)     Procedures  /  Critical Care Procedures  ED Course and Medical Decision Making  I have reviewed the triage vital signs, the nursing notes, and pertinent available records from the EMR.  Pertinent labs & imaging results that were available during my care of the patient were reviewed by me and considered in my medical decision making (see below for details).     Question of stone versus complication of metastatic disease versus less likely AAA.  CT to evaluate.  Labs are reassuring, CT is without acute process, normal caliber aorta, no kidney stone.  Upon further questioning and examination, the pain is most localized to the left lower quadrant but is largely nontender on exam, patient has been having constipation recently.  No signs of diverticulitis on the CT scan, appropriate for discharge with more  aggressive bowel regimen.  Barth Kirks. Sedonia Small, MD [place] [email or web address]  Final Clinical Impressions(s) / ED Diagnoses     ICD-10-CM   1. Flank pain  R10.9     ED Discharge Orders         Ordered    dicyclomine (BENTYL) 20 MG tablet  2 times daily     12/09/19 1551  Discharge Instructions Discussed with and Provided to Patient:     Discharge Instructions     You were evaluated in the Emergency Department and after careful evaluation, we did not find any emergent condition requiring admission or further testing in the hospital.  Your exam/testing today is overall reassuring.  Your symptoms may be due to continued constipation.  Please continue your laxative at home.  You can use the Bentyl medication as needed for discomfort.  Please return to the Emergency Department if you experience any worsening of your condition.  We encourage you to follow up with a primary care provider.  Thank you for allowing [place] to be a part of your care.       Maudie Flakes, MD 12/09/19 [phone]

## 2019-12-09 NOTE — Discharge Instructions (Addendum)
You were evaluated in the Emergency Department and after careful evaluation, we did not find any emergent condition requiring admission or further testing in the hospital.  Your exam/testing today is overall reassuring.  Your symptoms may be due to continued constipation.  Please continue your laxative at home.  You can use the Bentyl medication as needed for discomfort.  Please return to the Emergency Department if you experience any worsening of your condition.  We encourage you to follow up with a primary care provider.  Thank you for allowing [place] to be a part of your care.

## 2019-12-09 NOTE — ED Triage Notes (Signed)
Patient c/o left flank pain x 3 days. Patient reports that he has a history of kidney stones. patient denies any problems with urination.  Patient states he did not take his BP med today because he just completed a PET Scan this AM.

## 2019-12-10 ENCOUNTER — Ambulatory Visit (HOSPITAL_COMMUNITY)
Admission: RE | Admit: 2019-12-10 | Discharge: 2019-12-10 | Disposition: A | Discharge status: Home / Self Care | Payer: No Typology Code available for payment source | Attending: Pulmonary Disease | Admitting: Pulmonary Disease

## 2019-12-10 ENCOUNTER — Encounter (HOSPITAL_COMMUNITY): Payer: Self-pay | Admitting: Pulmonary Disease

## 2019-12-10 ENCOUNTER — Other Ambulatory Visit: Payer: Self-pay

## 2019-12-10 ENCOUNTER — Encounter (HOSPITAL_COMMUNITY)
Admission: RE | Disposition: A | Discharge status: Home / Self Care | Payer: Self-pay | Source: Home / Self Care | Attending: Pulmonary Disease

## 2019-12-10 ENCOUNTER — Ambulatory Visit (HOSPITAL_COMMUNITY): Payer: No Typology Code available for payment source | Admitting: Physician Assistant

## 2019-12-10 DIAGNOSIS — Z9119 Patient's noncompliance with other medical treatment and regimen: Secondary | ICD-10-CM | POA: Diagnosis not present

## 2019-12-10 DIAGNOSIS — Z7984 Long term (current) use of oral hypoglycemic drugs: Secondary | ICD-10-CM | POA: Insufficient documentation

## 2019-12-10 DIAGNOSIS — I10 Essential (primary) hypertension: Secondary | ICD-10-CM | POA: Diagnosis not present

## 2019-12-10 DIAGNOSIS — C771 Secondary and unspecified malignant neoplasm of intrathoracic lymph nodes: Secondary | ICD-10-CM | POA: Insufficient documentation

## 2019-12-10 DIAGNOSIS — Z8616 Personal history of COVID-19: Secondary | ICD-10-CM | POA: Diagnosis not present

## 2019-12-10 DIAGNOSIS — E119 Type 2 diabetes mellitus without complications: Secondary | ICD-10-CM | POA: Diagnosis not present

## 2019-12-10 DIAGNOSIS — E785 Hyperlipidemia, unspecified: Secondary | ICD-10-CM | POA: Diagnosis not present

## 2019-12-10 DIAGNOSIS — G4733 Obstructive sleep apnea (adult) (pediatric): Secondary | ICD-10-CM | POA: Insufficient documentation

## 2019-12-10 DIAGNOSIS — R590 Localized enlarged lymph nodes: Secondary | ICD-10-CM | POA: Diagnosis present

## 2019-12-10 DIAGNOSIS — F17210 Nicotine dependence, cigarettes, uncomplicated: Secondary | ICD-10-CM | POA: Diagnosis not present

## 2019-12-10 DIAGNOSIS — C3411 Malignant neoplasm of upper lobe, right bronchus or lung: Secondary | ICD-10-CM | POA: Diagnosis not present

## 2019-12-10 DIAGNOSIS — Z7982 Long term (current) use of aspirin: Secondary | ICD-10-CM | POA: Insufficient documentation

## 2019-12-10 DIAGNOSIS — Z79899 Other long term (current) drug therapy: Secondary | ICD-10-CM | POA: Insufficient documentation

## 2019-12-10 HISTORY — DX: Inflammatory liver disease, unspecified: K75.9

## 2019-12-10 HISTORY — DX: Depression, unspecified: F32.A

## 2019-12-10 HISTORY — DX: Dyspnea, unspecified: R06.00

## 2019-12-10 HISTORY — DX: Personal history of urinary calculi: Z87.442

## 2019-12-10 HISTORY — DX: Post-traumatic stress disorder, unspecified: F43.10

## 2019-12-10 HISTORY — PX: BIOPSY OF MEDIASTINAL MASS: SHX6389

## 2019-12-10 HISTORY — PX: BRONCHIAL NEEDLE ASPIRATION BIOPSY: SHX5106

## 2019-12-10 HISTORY — DX: Polyneuropathy, unspecified: G62.9

## 2019-12-10 HISTORY — DX: Gastro-esophageal reflux disease without esophagitis: K21.9

## 2019-12-10 HISTORY — PX: VIDEO BRONCHOSCOPY WITH ENDOBRONCHIAL ULTRASOUND: SHX6177

## 2019-12-10 HISTORY — DX: Unspecified osteoarthritis, unspecified site: M19.90

## 2019-12-10 LAB — PROTIME-INR
INR: 1.1 (ref 0.8–1.2)
Prothrombin Time: 13.7 seconds (ref 11.4–15.2)

## 2019-12-10 LAB — GLUCOSE, CAPILLARY
Glucose-Capillary: 169 mg/dL — ABNORMAL HIGH (ref 70–99)
Glucose-Capillary: 129 mg/dL — ABNORMAL HIGH (ref 70–99)
Glucose-Capillary: 136 mg/dL — ABNORMAL HIGH (ref 70–99)

## 2019-12-10 LAB — APTT: aPTT: 34 seconds (ref 24–36)

## 2019-12-10 SURGERY — BRONCHOSCOPY, WITH EBUS
Anesthesia: General

## 2019-12-10 MED ORDER — PHENYLEPHRINE 40 MCG/ML (10ML) SYRINGE FOR IV PUSH (FOR BLOOD PRESSURE SUPPORT)
PREFILLED_SYRINGE | INTRAVENOUS | Status: DC | PRN
  Administered 2019-12-10: 40 ug via INTRAVENOUS
  Administered 2019-12-10: 80 ug via INTRAVENOUS
  Administered 2019-12-10: 40 ug via INTRAVENOUS

## 2019-12-10 MED ORDER — PROPOFOL 10 MG/ML IV BOLUS
INTRAVENOUS | Status: DC | PRN
  Administered 2019-12-10: 120 mg via INTRAVENOUS
  Administered 2019-12-10: 40 mg via INTRAVENOUS

## 2019-12-10 MED ORDER — PROMETHAZINE HCL 25 MG/ML IJ SOLN: 6.2500 mg | INTRAMUSCULAR | Status: DC | PRN

## 2019-12-10 MED ORDER — LIDOCAINE 2% (20 MG/ML) 5 ML SYRINGE
INTRAMUSCULAR | Status: DC | PRN
  Administered 2019-12-10: 100 mg via INTRAVENOUS

## 2019-12-10 MED ORDER — ONDANSETRON HCL 4 MG/2ML IJ SOLN
INTRAMUSCULAR | Status: DC | PRN
  Administered 2019-12-10: 4 mg via INTRAVENOUS

## 2019-12-10 MED ORDER — LIDOCAINE HCL (PF) 1 % IJ SOLN
INTRAMUSCULAR | Status: DC | PRN
  Administered 2019-12-10: 6 mL

## 2019-12-10 MED ORDER — FENTANYL CITRATE (PF) 100 MCG/2ML IJ SOLN
INTRAMUSCULAR | Status: DC | PRN
  Administered 2019-12-10 (×2): 50 ug via INTRAVENOUS

## 2019-12-10 MED ORDER — LACTATED RINGERS IV SOLN: INTRAVENOUS | Status: DC

## 2019-12-10 SURGICAL SUPPLY — 30 items
BRUSH CYTOL CELLEBRITY 1.5X140 (MISCELLANEOUS) IMPLANT
CANISTER SUCT 3000ML PPV (MISCELLANEOUS) ×4 IMPLANT
CONT SPEC 4OZ CLIKSEAL STRL BL (MISCELLANEOUS) ×4 IMPLANT
COVER BACK TABLE 60X90IN (DRAPES) ×4 IMPLANT
COVER DOME SNAP 22 D (MISCELLANEOUS) ×4 IMPLANT
FORCEPS BIOP RJ4 1.8 (CUTTING FORCEPS) IMPLANT
GAUZE SPONGE 4X4 12PLY STRL (GAUZE/BANDAGES/DRESSINGS) ×4 IMPLANT
GLOVE BIO SURGEON STRL SZ7.5 (GLOVE) ×4 IMPLANT
GOWN STRL REUS W/ TWL LRG LVL3 (GOWN DISPOSABLE) ×2 IMPLANT
GOWN STRL REUS W/TWL LRG LVL3 (GOWN DISPOSABLE) ×2
KIT CLEAN ENDO COMPLIANCE (KITS) ×8 IMPLANT
KIT TURNOVER KIT B (KITS) ×4 IMPLANT
MARKER SKIN DUAL TIP RULER LAB (MISCELLANEOUS) ×4 IMPLANT
NEEDLE EBUS SONO TIP PENTAX (NEEDLE) ×4 IMPLANT
NS IRRIG 1000ML POUR BTL (IV SOLUTION) ×4 IMPLANT
OIL SILICONE PENTAX (PARTS (SERVICE/REPAIRS)) ×4 IMPLANT
PAD ARMBOARD 7.5X6 YLW CONV (MISCELLANEOUS) ×8 IMPLANT
SOL ANTI FOG 6CC (MISCELLANEOUS) ×2 IMPLANT
SOLUTION ANTI FOG 6CC (MISCELLANEOUS) ×2
SYR 20CC LL (SYRINGE) ×8 IMPLANT
SYR 20ML ECCENTRIC (SYRINGE) ×8 IMPLANT
SYR 50ML SLIP (SYRINGE) IMPLANT
SYR 5ML LUER SLIP (SYRINGE) ×4 IMPLANT
TOWEL OR 17X24 6PK STRL BLUE (TOWEL DISPOSABLE) ×4 IMPLANT
TRAP SPECIMEN MUCOUS 40CC (MISCELLANEOUS) IMPLANT
TUBE CONNECTING 20'X1/4 (TUBING) ×2
TUBE CONNECTING 20X1/4 (TUBING) ×6 IMPLANT
UNDERPAD 30X30 (UNDERPADS AND DIAPERS) ×4 IMPLANT
VALVE DISPOSABLE (MISCELLANEOUS) ×4 IMPLANT
WATER STERILE IRR 1000ML POUR (IV SOLUTION) ×4 IMPLANT

## 2019-12-10 NOTE — Transfer of Care (Signed)
Immediate Anesthesia Transfer of Care Note  Patient: Kevin Lambert.  Procedure(s) Performed: VIDEO BRONCHOSCOPY WITH ENDOBRONCHIAL ULTRASOUND (N/A ) BIOPSY OF MEDIASTINAL MASS BRONCHIAL NEEDLE ASPIRATION BIOPSIES  Patient Location: PACU  Anesthesia Type:General  Level of Consciousness: drowsy and patient cooperative  Airway & Oxygen Therapy: Patient Spontanous Breathing and Patient connected to face mask oxygen  Post-op Assessment: Report given to RN and Post -op Vital signs reviewed and stable  Post vital signs: Reviewed and stable  Last Vitals:  Vitals Value Taken Time  BP    Temp    Pulse    Resp    SpO2      Last Pain:  Vitals:   12/10/19 0925  TempSrc:   PainSc: 8          Complications: No apparent anesthesia complications

## 2019-12-10 NOTE — Anesthesia Preprocedure Evaluation (Addendum)
Anesthesia Evaluation  Patient identified by MRN, date of birth, ID band Patient awake    Reviewed: Allergy & Precautions, NPO status , Patient's Chart, lab work & pertinent test results  History of Anesthesia Complications Negative for: history of anesthetic complications  Airway Mallampati: III  TM Distance: >3 FB Neck ROM: Full    Dental  (+) Loose, Missing,    Pulmonary sleep apnea (noncompliant with CPAP) , Current Smoker,    Pulmonary exam normal        Cardiovascular hypertension, Pt. on medications Normal cardiovascular exam     Neuro/Psych Anxiety Depression negative neurological ROS     GI/Hepatic GERD  ,(+) Hepatitis -, C  Endo/Other  diabetes, Type 2, Oral Hypoglycemic Agents  Renal/GU negative Renal ROS  negative genitourinary   Musculoskeletal negative musculoskeletal ROS (+)   Abdominal   Peds  Hematology negative hematology ROS (+)   Anesthesia Other Findings Day of surgery medications reviewed with patient.  Reproductive/Obstetrics negative OB ROS                            Anesthesia Physical Anesthesia Plan  ASA: II  Anesthesia Plan: General   Post-op Pain Management:    Induction: Intravenous  PONV Risk Score and Plan: 2 and Treatment may vary due to age or medical condition, Ondansetron and Dexamethasone  Airway Management Planned: Oral ETT  Additional Equipment:   Intra-op Plan:   Post-operative Plan: Extubation in OR  Informed Consent: I have reviewed the patients History and Physical, chart, labs and discussed the procedure including the risks, benefits and alternatives for the proposed anesthesia with the patient or authorized representative who has indicated his/her understanding and acceptance.     Dental advisory given  Plan Discussed with: CRNA  Anesthesia Plan Comments:        Anesthesia Quick Evaluation

## 2019-12-10 NOTE — Interval H&P Note (Signed)
History and Physical Interval Note:  12/10/2019 11:28 AM  Kevin Lambert.  has presented today for surgery, with the diagnosis of LUNG MASS.  The various methods of treatment have been discussed with the patient and family. After consideration of risks, benefits and other options for treatment, the patient has consented to  Procedure(s): [place] (N/A) as a surgical intervention.  The patient's history has been reviewed, patient examined, no change in status, stable for surgery.  I have reviewed the patient's chart and labs.  Questions were answered to the patient's satisfaction.    Patient seen in pre-op. All questions answered. No barriers to proceed. Discussed risk, benefits, alternatives.  [place]

## 2019-12-10 NOTE — Op Note (Signed)
Video Bronchoscopy with Endobronchial Ultrasound Procedure Note  Date of Operation: 12/10/2019  Pre-op Diagnosis: Mediastinal adenopathy, PET avid  Post-op Diagnosis: Mediastinal adenopathy, PET avid  Surgeon: Garner Nash, DO   Assistants: None   Anesthesia: General LMA anesthesia  Operation: Flexible video fiberoptic bronchoscopy with endobronchial ultrasound and biopsies.  Estimated Blood Loss: Minimal, <2TW  Complications: None   Indications and History: Kevin Lambert. is a 64 y.o. 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  The risks, benefits, complications, treatment options and expected outcomes were discussed with the patient.  The possibilities of pneumothorax, pneumonia, reaction to medication, pulmonary aspiration, perforation of a viscus, bleeding, failure to diagnose a condition and creating a complication requiring transfusion or operation were discussed with the patient who freely signed the consent.    Description of Procedure: The patient was examined in the preoperative area and history and data from the preprocedure consultation were reviewed. It was deemed appropriate to proceed. The patient was taken to endoscopy room 2, identified as Kevin Lambert. and the procedure verified as Flexible Video Fiberoptic Bronchoscopy.  A Time Out was held and the above information confirmed. After being taken to the operating room general anesthesia was initiated and the patient  was orally intubated. 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 The standard scope was then withdrawn and the endobronchial ultrasound was used to identify and characterize the peritracheal, hilar and bronchial lymph nodes. Inspection showed enlarged 4R and station 7. Using real-time ultrasound guidance Wang needle biopsies were take from Station 7 nodes and were sent for cytology.  Following endobronchial ultrasound we switched back to a standard bronchoscope.  A 2 mm forceps, [employer], were used for biopsies of the right upper lobe posterior segment endobronchial lesion.  This small portion of tumor was fully occluding the posterior segment.  The biopsy forceps relieved to the occlusion of this endobronchial tumor.  There was near 80% patency of this with ragged edges of tumor present following debulking and removal through forcep biopsies.  There was no significant bleeding.  The patient tolerated the procedure well without apparent complications.  The bronchoscope was returned to just above the main carina.  There was therapeutic suctioning of all secretions and blood clots.  Saline was used to wash from this area.  A BAL was completed to the right upper lobe sent for cytology.  here was no significant blood loss. The bronchoscope was withdrawn. Anesthesia was reversed and the patient was taken to the PACU for recovery.   Samples: 1. Wang needle biopsies from 7 node 2.  Right upper lobe endobronchial biopsies 3.  BAL right upper lobe  Plans:  The patient will be discharged from the PACU to home when recovered from anesthesia. We will review the cytology, pathology and microbiology results with the patient when they become available. Outpatient followup will be with Garner Nash, DO    Preliminary pathology: Adequate for tissue diagnosis  Garner Nash, DO [facility] 12/10/2019  12:43  PM

## 2019-12-10 NOTE — Progress Notes (Signed)
Discharge contact information: Johnathan Hausen (sister) [phone]

## 2019-12-10 NOTE — Discharge Instructions (Signed)
Flexible Bronchoscopy, Care After This sheet gives you information about how to care for yourself after your test. Your doctor may also give you more specific instructions. If you have problems or questions, contact your doctor. Follow these instructions at home: Eating and drinking  The day after the test, go back to your normal diet. Driving  Do not drive for 24 hours if you were given a medicine to help you relax (sedative).  Do not drive or use heavy machinery while taking prescription pain medicine. General instructions   Take over-the-counter and prescription medicines only as told by your doctor.  Return to your normal activities as told. Ask what activities are safe for you.  Do not use any products that have nicotine or tobacco in them. This includes cigarettes and e-cigarettes. If you need help quitting, ask your doctor.  Keep all follow-up visits as told by your doctor. This is important. It is very important if you had a tissue sample (biopsy) taken. Get help right away if:  You have shortness of breath that gets worse.  You get light-headed.  You feel like you are going to pass out (faint).  You have chest pain.  You cough up: ? More than a little blood. ? More blood than before. Summary  Do not eat or drink anything (not even water) for 2 hours after your test, or until your numbing medicine wears off.  Do not use cigarettes. Do not use e-cigarettes.  Get help right away if you have chest pain. This information is not intended to replace advice given to you by your health care provider. Make sure you discuss any questions you have with your health care provider. Document Revised: 10/06/2017 Document Reviewed: 11/11/2016 Elsevier Patient Education  2020 [employer].

## 2019-12-10 NOTE — Anesthesia Procedure Notes (Signed)
Procedure Name: LMA Insertion Date/Time: 12/10/2019 11:55 AM Performed by: Verdie Drown, CRNA Pre-anesthesia Checklist: Patient identified, Emergency Drugs available, Suction available and Patient being monitored Patient Re-evaluated:Patient Re-evaluated prior to induction Oxygen Delivery Method: Circle System Utilized Preoxygenation: Pre-oxygenation with 100% oxygen Induction Type: IV induction Ventilation: Mask ventilation without difficulty LMA: LMA inserted LMA Size: 4.0 Number of attempts: 1 Airway Equipment and Method: Bite block Placement Confirmation: positive ETCO2 Tube secured with: Tape Dental Injury: Teeth and Oropharynx as per pre-operative assessment  Comments: Inserted by Paulina Fusi, SRNA

## 2019-12-11 ENCOUNTER — Ambulatory Visit (HOSPITAL_COMMUNITY)
Admission: RE | Admit: 2019-12-11 | Discharge: 2019-12-11 | Disposition: A | Discharge status: Home / Self Care | Payer: No Typology Code available for payment source | Source: Ambulatory Visit | Attending: Internal Medicine | Admitting: Internal Medicine

## 2019-12-11 DIAGNOSIS — C3490 Malignant neoplasm of unspecified part of unspecified bronchus or lung: Secondary | ICD-10-CM | POA: Diagnosis not present

## 2019-12-11 MED ORDER — GADOBUTROL 1 MMOL/ML IV SOLN
10.0000 mL | Freq: Once | INTRAVENOUS | Status: AC | PRN
  Administered 2019-12-11: 10 mL via INTRAVENOUS

## 2019-12-12 ENCOUNTER — Telehealth: Payer: Self-pay | Admitting: Medical Oncology

## 2019-12-12 LAB — CYTOLOGY - NON PAP

## 2019-12-12 LAB — SURGICAL PATHOLOGY

## 2019-12-12 NOTE — Telephone Encounter (Signed)
I told he can bring someone with him to his appt.

## 2019-12-12 NOTE — Telephone Encounter (Signed)
He will contact his job to fax work forms to our office.

## 2019-12-12 NOTE — Anesthesia Postprocedure Evaluation (Signed)
Anesthesia Post Note  Patient: Kevin Lambert.  Procedure(s) Performed: VIDEO BRONCHOSCOPY WITH ENDOBRONCHIAL ULTRASOUND (N/A ) BIOPSY OF MEDIASTINAL MASS BRONCHIAL NEEDLE ASPIRATION BIOPSIES     Patient location during evaluation: PACU Anesthesia Type: General Level of consciousness: sedated and patient cooperative Pain management: pain level controlled Vital Signs Assessment: post-procedure vital signs reviewed and stable Respiratory status: spontaneous breathing Cardiovascular status: stable Anesthetic complications: no    Last Vitals:  Vitals:   12/10/19 1415 12/10/19 1440  BP: (!) 142/77 (!) 148/82  Pulse: 99 99  Resp: 20 18  Temp: (!) 36.1 C   SpO2: 96% 96%    Last Pain:  Vitals:   12/10/19 0925  TempSrc:   PainSc: [place]

## 2019-12-16 ENCOUNTER — Other Ambulatory Visit: Payer: Self-pay

## 2019-12-16 ENCOUNTER — Inpatient Hospital Stay: Payer: No Typology Code available for payment source | Attending: Internal Medicine | Admitting: Internal Medicine

## 2019-12-16 ENCOUNTER — Telehealth: Payer: Self-pay | Admitting: *Deleted

## 2019-12-16 ENCOUNTER — Encounter: Payer: Self-pay | Admitting: Medical Oncology

## 2019-12-16 ENCOUNTER — Encounter: Payer: Self-pay | Admitting: Internal Medicine

## 2019-12-16 VITALS — BP 170/73 | HR 86 | Temp 98.5°F | Resp 18 | Ht 69.0 in | Wt 246.6 lb

## 2019-12-16 DIAGNOSIS — E785 Hyperlipidemia, unspecified: Secondary | ICD-10-CM | POA: Insufficient documentation

## 2019-12-16 DIAGNOSIS — E119 Type 2 diabetes mellitus without complications: Secondary | ICD-10-CM | POA: Diagnosis not present

## 2019-12-16 DIAGNOSIS — Z5112 Encounter for antineoplastic immunotherapy: Secondary | ICD-10-CM | POA: Diagnosis not present

## 2019-12-16 DIAGNOSIS — I10 Essential (primary) hypertension: Secondary | ICD-10-CM | POA: Insufficient documentation

## 2019-12-16 DIAGNOSIS — C3491 Malignant neoplasm of unspecified part of right bronchus or lung: Secondary | ICD-10-CM | POA: Diagnosis not present

## 2019-12-16 DIAGNOSIS — Z5111 Encounter for antineoplastic chemotherapy: Secondary | ICD-10-CM | POA: Insufficient documentation

## 2019-12-16 DIAGNOSIS — Z79899 Other long term (current) drug therapy: Secondary | ICD-10-CM | POA: Insufficient documentation

## 2019-12-16 DIAGNOSIS — C3411 Malignant neoplasm of upper lobe, right bronchus or lung: Secondary | ICD-10-CM | POA: Diagnosis present

## 2019-12-16 DIAGNOSIS — Z720 Tobacco use: Secondary | ICD-10-CM

## 2019-12-16 DIAGNOSIS — Z7189 Other specified counseling: Secondary | ICD-10-CM | POA: Insufficient documentation

## 2019-12-16 NOTE — Research (Signed)
[facility] Study: [id number]: Consent for Next Generation Sequencing (NGS)/Genetic Test Dr. Julien Nordmann referred patient to study this morning. I met with patient, here with his sister, after his scheduled appointment with Dr. Julien Nordmann. Patient confirms that MD gave him a brief overview of the study. I informed patient about the study and the NGS testing. I informed patient for him to be eligible for the study to pay for the NGS test, I would need to complete a pre-eligibility check, per the study requirement. Patient was informed that that he doesn't need to have a pre-eligibility completed for study if he chose to have the testing done through his insurance. Patient was informed that the study requirement is for patient to have presence of KEAP1/NRF2- mutation to be eligible, as well as other inclusion/exclusion criteria met for enrollment and randomization to study. Patient gave verbal understanding to the above information provided to him and stated he wished to proceed with the process of the NGS/Genetic Test consenting. Patient, I and his sister proceeded to read, page by page, this consent form, Version 2.0 Revised 14 Aug 2019, and after all their questions were answered to their satisfacton, patient proceeded to sign and date where indicated. Patient was informed that I will check for eligibility for study paid NGS testing and will inform him before the end of the day. I informed patient that if he is eligible we can schedule him for the lab draw in the morning and should he be found not to be eligible, then he will need to have the testing completed through his insurance, patient gave verbal understanding to this information.  Patient was also provided with the study's main consent form to review in the meantime and for any questions, was encouraged to call me.  Patient was provided with a copy of the signed NGS consent form for his records.  Patient and his sister were thanked for their time today and were  encouraged to call me with questions. Patient was also provided with a Clinical Trials information pamphlet and they both were provided with my contact information.  Approximately thirty minutes were spent with patient explaining the study and consenting him for the NGS testing.  Maxwell Marion, RN, BSN, [facility] 12/16/2019 10:30 AM   Study required inclusion/exclusion criteria checked, for the NGS study paid testing, and second nurse verifying of eligibility confirmed, by system-wide manager Doreatha Martin, RN. I called patient and informed him and we made arrangements for him to come to clinic for the study lab draw tomorrow at noon (patient's request). Patient thanked and encouraged to call in the meantime. Scheduling request placed for lab appointment. Research lab orders placed.  Maxwell Marion, RN, BSN, [facility] Clinical [facility] 12/16/2019 4:48 PM    Late entry: Patient presented to lab on 12/17/2019 for study provided NGS testing. NGS testing kit sent to study for processing. Patient was informed that we should have results in 7-10 days and that Dr. Julien Nordmann will discuss results with him. Patient denied any questions. Patient was thanked, encouraged to call with questions.  Maxwell Marion, RN, BSN, [facility] Clinical Research 12/24/2019 4:48 PM  NGS test results received today from study. Study report resulted with no detection of the Eligible KEAP1 or NRF2 Mutation, as well as no detection of the STK11/LKB1 Mutation for stratification, which makes patient ineligible to proceed with study. Dr. Julien Nordmann aware of results and will discuss with patient.  Maxwell Marion, RN, BSN, [facility] Clinical Research 12/25/2019 1:27 PM

## 2019-12-16 NOTE — Progress Notes (Signed)
[place] Telephone:([phone]) [phone]   [phone]  OFFICE PROGRESS NOTE  [name], MD [address] [place]  [place] [address]  DIAGNOSIS: Stage IV (T2b, N3, M1a) non-small cell lung cancer, adenocarcinoma presented with large right upper lobe lung mass in addition to mediastinal and right supraclavicular lymphadenopathy as well as bilateral pulmonary nodules diagnosed in February 2021  PRIOR THERAPY: None  CURRENT THERAPY: None.  INTERVAL HISTORY: Kevin Lambert. 64 y.o. male returns to the clinic today for follow-up visit accompanied by his sister Cinda.  The patient is feeling fine today with no concerning complaints except for pain in the left hip area.  He denied having any current chest pain, shortness of breath, cough or hemoptysis.  He denied having any fever or chills.  He has no nausea, vomiting, diarrhea or constipation.  He has no headache or visual changes.  He had several studies performed recently including a PET scan as well as MRI of the brain and bronchoscopy with endobronchial ultrasound and biopsy under the care of Dr. Valeta Harms.  The patient is here today for evaluation and discussion of his treatment options based on the recent imaging and biopsy results.  MEDICAL HISTORY: Past Medical History:  Diagnosis Date  . Arthritis   . Depression   . DM (diabetes mellitus) ([place])    type II  . Dyslipidemia   . Dyspnea    unable to walk much - he thinks it is from   . GERD (gastroesophageal reflux disease)   . Hepatitis    Hepatitis C- treated  . History of kidney stones   . HTN (hypertension)   . Neuropathy   . OSA on CPAP   . PTSD (post-traumatic stress disorder)     ALLERGIES:  is allergic to metformin and related.  MEDICATIONS:  Current Outpatient Medications  Medication Sig Dispense Refill  . amLODipine (NORVASC) 10 MG tablet Take 10 mg by mouth daily.    Marland Kitchen aspirin EC 81 MG tablet Take 81 mg by mouth daily.    Marland Kitchen atorvastatin  (LIPITOR) 10 MG tablet Take 10 mg by mouth daily.    . calcium carbonate (TUMS - DOSED IN MG ELEMENTAL CALCIUM) 500 MG chewable tablet Chew 2 tablets by mouth daily as needed for indigestion or heartburn.    . dicyclomine (BENTYL) 20 MG tablet Take 1 tablet (20 mg total) by mouth 2 (two) times daily. 20 tablet 0  . glipiZIDE (GLUCOTROL) 5 MG tablet Take 5 mg by mouth daily before breakfast.     . LORazepam (ATIVAN) 0.5 MG tablet Take 0.5 mg by mouth daily as needed for anxiety.     . Menthol ([employer] LOZENGES MT) Use as directed 1 lozenge in the mouth or throat daily as needed (throat).     . sildenafil (VIAGRA) 100 MG tablet Take 100 mg by mouth daily as needed for erectile dysfunction.     No current facility-administered medications for this visit.    SURGICAL HISTORY:  Past Surgical History:  Procedure Laterality Date  . BIOPSY OF MEDIASTINAL MASS  12/10/2019   Procedure: BIOPSY OF MEDIASTINAL MASS;  Surgeon: Garner Nash, DO;  Location: [facility];  Service: Pulmonary;;  right upper lobe  . BRONCHIAL NEEDLE ASPIRATION BIOPSY  12/10/2019   Procedure: BRONCHIAL NEEDLE ASPIRATION BIOPSIES;  Surgeon: Garner Nash, DO;  Location: [facility];  Service: Pulmonary;;  . COLONOSCOPY    . kidney stone    . URETERAL STENT PLACEMENT    .  Ureteral Stent Removed    . VIDEO BRONCHOSCOPY WITH ENDOBRONCHIAL ULTRASOUND N/A 12/10/2019   Procedure: VIDEO BRONCHOSCOPY WITH ENDOBRONCHIAL ULTRASOUND;  Surgeon: Garner Nash, DO;  Location: [facility];  Service: Pulmonary;  Laterality: N/A;    REVIEW OF SYSTEMS:  Constitutional: negative Eyes: negative Ears, nose, mouth, throat, and face: negative Respiratory: negative Cardiovascular: negative Gastrointestinal: negative Genitourinary:negative Integument/breast: negative Hematologic/lymphatic: negative Musculoskeletal:positive for arthralgias Neurological: negative Behavioral/Psych: negative Endocrine: negative Allergic/Immunologic: negative    PHYSICAL EXAMINATION: General appearance: alert, cooperative and no distress Head: Normocephalic, without obvious abnormality, atraumatic Neck: no adenopathy, no JVD, supple, symmetrical, trachea midline and thyroid not enlarged, symmetric, no tenderness/mass/nodules Lymph nodes: Cervical, supraclavicular, and axillary nodes normal. Resp: clear to auscultation bilaterally Back: symmetric, no curvature. ROM normal. No CVA tenderness. Cardio: regular rate and rhythm, S1, S2 normal, no murmur, click, rub or gallop GI: soft, non-tender; bowel sounds normal; no masses,  no organomegaly Extremities: extremities normal, atraumatic, no cyanosis or edema Neurologic: Alert and oriented X 3, normal strength and tone. Normal symmetric reflexes. Normal coordination and gait  ECOG PERFORMANCE STATUS: 1 - Symptomatic but completely ambulatory  Blood pressure (!) 170/73, pulse 86, temperature 98.5 F (36.9 C), temperature source Temporal, resp. rate 18, height 5\' 9"  (1.753 m), weight 246 lb 9.6 oz (111.9 kg), SpO2 99 %.  LABORATORY DATA: Lab Results  Component Value Date   WBC 8.3 12/09/2019   HGB 14.3 12/09/2019   HCT 42.1 12/09/2019   MCV 95.9 12/09/2019   PLT 208 12/09/2019      Chemistry      Component Value Date/Time   NA 137 12/09/2019 1310   K 4.1 12/09/2019 1310   CL 101 12/09/2019 1310   CO2 26 12/09/2019 1310   BUN 18 12/09/2019 1310   CREATININE 0.78 12/09/2019 1310   CREATININE 0.80 11/29/2019 1103      Component Value Date/Time   CALCIUM 9.9 12/09/2019 1310   ALKPHOS 74 12/09/2019 1310   AST 19 12/09/2019 1310   AST 24 11/29/2019 1103   ALT 33 12/09/2019 1310   ALT 48 (H) 11/29/2019 1103   BILITOT 0.6 12/09/2019 1310   BILITOT 0.3 11/29/2019 1103       RADIOGRAPHIC STUDIES: MR BRAIN W WO CONTRAST  Result Date: 12/11/2019 CLINICAL DATA:  Non-small cell lung cancer, staging. EXAM: MRI HEAD WITHOUT AND WITH CONTRAST TECHNIQUE: Multiplanar, multiecho pulse sequences  of the brain and surrounding structures were obtained without and with intravenous contrast. CONTRAST:  61mL GADAVIST GADOBUTROL 1 MMOL/ML IV SOLN COMPARISON:  None. FINDINGS: Study is partially degraded by motion. Brain: No acute infarction, hemorrhage, hydrocephalus, extra-axial collection or mass lesion. Subtle T2 hyperintensity is noted periventricular matter suggesting chronic small vessel disease. No focus of abnormal contrast enhancement seen. Vascular: Normal flow voids. Skull and upper cervical spine: Degenerative changes of the cervical spine are noted posterior disc osteophyte complex at C3-C4. Sinuses/Orbits: Mucous retention cyst is noted in the left maxillary antrum. Small bilateral mastoid effusions. Other: Partial empty sella. IMPRESSION: No evidence of intracranial metastatic disease. Electronically Signed   By: Pedro Earls M.D.   On: 12/11/2019 10:21   NM PET Image Initial (PI) Skull Base To Thigh  Result Date: 12/09/2019 CLINICAL DATA:  Initial treatment strategy for non-small cell lung cancer. EXAM: NUCLEAR MEDICINE PET SKULL BASE TO THIGH TECHNIQUE: 12.5 mCi F-18 FDG was injected intravenously. Full-ring PET imaging was performed from the skull base to thigh after the radiotracer. CT data was obtained and  used for attenuation correction and anatomic localization. Fasting blood glucose: 155 mg/dl COMPARISON:  None FINDINGS: Mediastinal blood pool activity: SUV max 3.59 Liver activity: SUV max NA NECK: No hypermetabolic lymph nodes in the neck. Incidental CT findings: none CHEST: Within the posterior right upper lobe there is a FDG avid lung mass which measures approximately 5.0 x 4.4 cm and has an SUV max of 11.67. There is associated postobstructive pneumonitis within the posterior and lateral right upper lobe. FDG avid tumor appears to extend across the fissure to involve the superior segment of right lower lobe, image 37/8. There are several solid pulmonary nodules  identified in both lungs. -Index nodule within the anteromedial left upper lobe measures 0.9 cm and has an SUV max of 1.7. -Superior segment left lower lobe lung nodule measures 1 cm with SUV max of 1.6. Enlarged right sided mediastinal, right hilar and subcarinal lymph nodes identified: -Right paratracheal lymph node measures 2.3 cm with SUV max of 8.9. -subcarinal lymph node measures 2.6 cm with SUV max of 7.58. -Within the right supraclavicular region there is a node measuring 1.1 cm short axis with SUV max of 2.92. Incidental CT findings: Aortic atherosclerosis. Coronary artery calcifications identified. ABDOMEN/PELVIS: Indeterminate low-attenuation right adrenal nodule measures 2.3 cm and 7.5 HU. No significant FDG uptake is identified within this nodule. No FDG avid lesions identified within the liver, pancreas, or spleen. No hypermetabolic abdominopelvic lymph nodes. Incidental CT findings: Aortic atherosclerosis. Stone within the left mid kidney measures 6 mm. Bladder wall thickening is identified. Right hydrocele. Fat containing inguinal hernias are noted, right greater than left. SKELETON: No focal hypermetabolic activity to suggest skeletal metastasis. Incidental CT findings: none IMPRESSION: 1. Posterior right upper lobe lung mass is FDG avid and concerning for primary bronchogenic carcinoma. There is associated postobstructive pneumonitis within the posterior and lateral right upper lobe. Tumor also appears to extend into the superior segment of right lower lobe. 2. FDG avid right hilar, subcarinal, right paratracheal lymph nodes are identified compatible with metastatic adenopathy. Borderline enlarged right supraclavicular lymph node exhibits mild FDG uptake and is equivocal for nodal metastases. 3. Bilateral pulmonary nodules are identified, suspicious for metastatic disease. 4. No findings of metastasis to the abdomen or pelvis. 5. Low-density right adrenal nodule without significant FDG uptake is  indeterminate but favored to represent a benign adenoma. 6. Aortic atherosclerosis and coronary artery calcifications. Electronically Signed   By: Kerby Moors M.D.   On: 12/09/2019 11:06   CT RENAL STONE STUDY  Result Date: 12/09/2019 CLINICAL DATA:  Left flank pain for 3 days. EXAM: CT ABDOMEN AND PELVIS WITHOUT CONTRAST TECHNIQUE: Multidetector CT imaging of the abdomen and pelvis was performed following the standard protocol without IV contrast. COMPARISON:  None. FINDINGS: Lower chest: No acute abnormality. Hepatobiliary: No focal liver abnormality is seen. No gallstones, gallbladder wall thickening, or biliary dilatation. Pancreas: Unremarkable. No pancreatic ductal dilatation or surrounding inflammatory changes. Spleen: Normal in size without focal abnormality. Adrenals/Urinary Tract: The bilateral adrenal glands are normal. There is no hydronephrosis bilaterally. Small nonobstructing stones are identified in both kidneys. Renal vascular calcifications are so noted bilaterally. There is a 1.8 cm low-density in the upper to midpole medial right kidney this may be a cyst. Bladder is normal. Stomach/Bowel: Stomach is within normal limits. Appendix appears normal. No evidence of bowel wall thickening, distention, or inflammatory changes. Vascular/Lymphatic: Aortic atherosclerosis. No enlarged abdominal or pelvic lymph nodes. Reproductive: Prostate calcifications are identified. Other: Right inguinal herniation of mesenteric fat is  noted. Musculoskeletal: Degenerative joint changes of the spine are identified. IMPRESSION: 1. Small nonobstructing stones in both kidneys. No hydronephrosis is identified bilaterally. 2. No acute abnormality identified in the abdomen and pelvis. Electronically Signed   By: Abelardo Diesel M.D.   On: 12/09/2019 15:28    ASSESSMENT AND PLAN: This is a very pleasant 64 years old African-American male recently diagnosed with a stage IV non-small cell lung cancer, adenocarcinoma  presented with large right lower lobe lung mass in addition to mediastinal and right supraclavicular lymphadenopathy as well as bilateral pulmonary nodules diagnosed in February 2021. I personally and independently reviewed the scan images and discussed the results with the patient and his sister. His brain MRI showed no concerning findings for disease metastasis to the brain. I explained to the patient that he has incurable condition and all the treatment options will be of palliative nature. I discussed with the patient several options for treatment of his condition including palliative care and hospice referral versus consideration of treatment with systemic chemotherapy with carboplatin, Alimta and Keytruda versus testing for the molecular studies by guardant 360 or enrollment in the [facility] clinical trial where the patient will be tested for actionable mutation as well as KEAP1/ NRF2 mutations. The patient and his sister are interested in the clinical trial and he will be seen by the clinical research nurse later today for evaluation and discussion of the eligibility criteria and also for doing the blood test. I will see the patient back for follow-up visit in around 10-14 days for evaluation and detailed discussion of his treatment options based on the result of the molecular studies. For hypertension, I strongly encouraged the patient to take his blood pressure medication as prescribed and to monitor it closely at home. He was advised to call immediately if he has any concerning symptoms in the interval.  The patient and his sister had a lot of questions and I answered them completely to their satisfaction. The patient voices understanding of current disease status and treatment options and is in agreement with the current care plan.  All questions were answered. The patient knows to call the clinic with any problems, questions or concerns. We can certainly see the patient much sooner if  necessary.  Disclaimer: This note was dictated with voice recognition software. Similar sounding words can inadvertently be transcribed and may not be corrected upon review.

## 2019-12-17 ENCOUNTER — Telehealth: Payer: Self-pay | Admitting: Internal Medicine

## 2019-12-17 ENCOUNTER — Telehealth: Payer: Self-pay | Admitting: Medical Oncology

## 2019-12-17 ENCOUNTER — Other Ambulatory Visit: Payer: Self-pay

## 2019-12-17 ENCOUNTER — Inpatient Hospital Stay: Payer: No Typology Code available for payment source

## 2019-12-17 DIAGNOSIS — C3491 Malignant neoplasm of unspecified part of right bronchus or lung: Secondary | ICD-10-CM

## 2019-12-17 LAB — RESEARCH LABS

## 2019-12-17 NOTE — Telephone Encounter (Signed)
Confirmed appt for the 17th.

## 2019-12-17 NOTE — Telephone Encounter (Signed)
Scheduled per los. Called and lef msg. Mailed printout

## 2019-12-18 ENCOUNTER — Other Ambulatory Visit: Payer: Self-pay | Admitting: Internal Medicine

## 2019-12-18 ENCOUNTER — Ambulatory Visit
Admission: RE | Admit: 2019-12-18 | Discharge: 2019-12-18 | Disposition: A | Discharge status: Home / Self Care | Payer: Self-pay | Source: Ambulatory Visit | Attending: Internal Medicine | Admitting: Internal Medicine

## 2019-12-18 DIAGNOSIS — C3490 Malignant neoplasm of unspecified part of unspecified bronchus or lung: Secondary | ICD-10-CM

## 2019-12-19 ENCOUNTER — Other Ambulatory Visit: Payer: Self-pay | Admitting: *Deleted

## 2019-12-19 ENCOUNTER — Encounter: Payer: Self-pay | Admitting: *Deleted

## 2019-12-19 NOTE — Progress Notes (Signed)
The proposed treatment discussed in cancer conference 12/19/19 is for discussion purpose only and is not a binding recommendation.  The patient was not physically examined nor present for their treatment options.  Therefore, final treatment plans cannot be decided.  

## 2019-12-19 NOTE — Progress Notes (Signed)
Oncology Nurse Navigator Documentation  Oncology Nurse Navigator Flowsheets 12/19/2019  Navigator Location [facility]  Navigator Encounter Type Other/per Dr. Julien Nordmann, I updated pathology dept on his request for Foundation One and PDL 1 to be sent on case number [id number].   Barriers/Navigation Needs Coordination of Care  Interventions Coordination of Care  Acuity Level 2-Minimal Needs (1-2 Barriers Identified)  Coordination of Care Other  Time Spent with Patient 15

## 2019-12-20 ENCOUNTER — Telehealth: Payer: Self-pay | Admitting: Medical Oncology

## 2019-12-20 NOTE — Telephone Encounter (Signed)
Pt did not have a good night.    SOB and  asking for an inhaler. He said his SOB is not any worse since he saw Mohamed 4 days ago. I instructed him to call his PCP about an inhaler.  Cough - I instructed him to try  Delysm q 12 hours prn for his cough.  Constipation-LBM yesterday after dulcolax night before. Usual pattern is BM 2-3 times a week and only after he takes dulcolax the night before. I instructed Andrewjames  to start Miralax qd and take  Senakot s-1 tablet bid prn and to hold laxative if he starts having abdominal pain.     Pain in lower body happens at night. -Hip and back pain- pt taking Tylenol 500mg  3 tabs q hs  prn . He does not take tylenol everyday and he only takes it on nights when he has been up on his feet all day.  Pt was instructed to go to ED if he has worsening SOB, pain .

## 2019-12-24 ENCOUNTER — Telehealth: Payer: Self-pay | Admitting: Medical Oncology

## 2019-12-24 NOTE — Telephone Encounter (Signed)
I lvm for pt that molecular tests have not  resulted so Mohamed cancelled his appt for tomorrow . I requested that he return my call in am to confirm he received the message.

## 2019-12-25 ENCOUNTER — Encounter: Payer: Self-pay | Admitting: Internal Medicine

## 2019-12-25 ENCOUNTER — Encounter: Payer: Self-pay | Admitting: Medical Oncology

## 2019-12-25 ENCOUNTER — Other Ambulatory Visit: Payer: Self-pay

## 2019-12-25 ENCOUNTER — Inpatient Hospital Stay (HOSPITAL_BASED_OUTPATIENT_CLINIC_OR_DEPARTMENT_OTHER): Payer: No Typology Code available for payment source | Admitting: Internal Medicine

## 2019-12-25 ENCOUNTER — Inpatient Hospital Stay: Payer: No Typology Code available for payment source

## 2019-12-25 VITALS — BP 156/66 | HR 88 | Temp 98.0°F | Resp 18 | Ht 69.0 in | Wt 247.2 lb

## 2019-12-25 DIAGNOSIS — Z7189 Other specified counseling: Secondary | ICD-10-CM

## 2019-12-25 DIAGNOSIS — C3491 Malignant neoplasm of unspecified part of right bronchus or lung: Secondary | ICD-10-CM | POA: Diagnosis not present

## 2019-12-25 DIAGNOSIS — I10 Essential (primary) hypertension: Secondary | ICD-10-CM

## 2019-12-25 DIAGNOSIS — Z5111 Encounter for antineoplastic chemotherapy: Secondary | ICD-10-CM

## 2019-12-25 DIAGNOSIS — Z5112 Encounter for antineoplastic immunotherapy: Secondary | ICD-10-CM | POA: Diagnosis not present

## 2019-12-25 LAB — CBC WITH DIFFERENTIAL (CANCER CENTER ONLY)
Abs Immature Granulocytes: 0.03 10*3/uL (ref 0.00–0.07)
Basophils Absolute: 0 10*3/uL (ref 0.0–0.1)
Basophils Relative: 0 %
Eosinophils Absolute: 0.3 10*3/uL (ref 0.0–0.5)
Eosinophils Relative: 4 %
HCT: 41 % (ref 39.0–52.0)
Hemoglobin: 14 g/dL (ref 13.0–17.0)
Immature Granulocytes: 1 %
Lymphocytes Relative: 30 %
Lymphs Abs: 2 10*3/uL (ref 0.7–4.0)
MCH: 31.8 pg (ref 26.0–34.0)
MCHC: 34.1 g/dL (ref 30.0–36.0)
MCV: 93.2 fL (ref 80.0–100.0)
Monocytes Absolute: 0.7 10*3/uL (ref 0.1–1.0)
Monocytes Relative: 11 %
Neutro Abs: 3.4 10*3/uL (ref 1.7–7.7)
Neutrophils Relative %: 54 %
Platelet Count: 210 10*3/uL (ref 150–400)
RBC: 4.4 MIL/uL (ref 4.22–5.81)
RDW: 14 % (ref 11.5–15.5)
WBC Count: 6.4 10*3/uL (ref 4.0–10.5)
nRBC: 0 % (ref 0.0–0.2)

## 2019-12-25 LAB — CMP (CANCER CENTER ONLY)
ALT: 40 U/L (ref 0–44)
AST: 18 U/L (ref 15–41)
Albumin: 3.8 g/dL (ref 3.5–5.0)
Alkaline Phosphatase: 77 U/L (ref 38–126)
Anion gap: 8 (ref 5–15)
BUN: 16 mg/dL (ref 8–23)
CO2: 28 mmol/L (ref 22–32)
Calcium: 10.2 mg/dL (ref 8.9–10.3)
Chloride: 103 mmol/L (ref 98–111)
Creatinine: 0.79 mg/dL (ref 0.61–1.24)
GFR, Est AFR Am: >60 mL/min (ref >60)
GFR, Estimated: >60 mL/min (ref >60)
Glucose, Bld: 113 mg/dL — ABNORMAL HIGH (ref 70–99)
Potassium: 4.1 mmol/L (ref 3.5–5.1)
Sodium: 139 mmol/L (ref 135–145)
Total Bilirubin: 0.2 mg/dL — ABNORMAL LOW (ref 0.3–1.2)
Total Protein: 6.8 g/dL (ref 6.5–8.1)

## 2019-12-25 MED ORDER — FOLIC ACID 1 MG PO TABS: 1.0000 mg | ORAL_TABLET | Freq: Every day | ORAL | 4 refills | Status: DC

## 2019-12-25 MED ORDER — CYANOCOBALAMIN 1000 MCG/ML IJ SOLN
1000.0000 ug | Freq: Once | INTRAMUSCULAR | Status: AC
  Administered 2019-12-25: 12:00:00 1000 ug via INTRAMUSCULAR

## 2019-12-25 MED ORDER — PROCHLORPERAZINE MALEATE 10 MG PO TABS: 10.0000 mg | ORAL_TABLET | Freq: Four times a day (QID) | ORAL | 0 refills | Status: DC | PRN

## 2019-12-25 MED ORDER — LIDOCAINE-PRILOCAINE 2.5-2.5 % EX CREA: TOPICAL_CREAM | CUTANEOUS | 0 refills | Status: DC

## 2019-12-25 MED ORDER — CYANOCOBALAMIN 1000 MCG/ML IJ SOLN
INTRAMUSCULAR | Status: AC
  Filled 2019-12-25: qty 1

## 2019-12-25 NOTE — Patient Instructions (Signed)
Pembrolizumab injection What is this medicine? PEMBROLIZUMAB (pem broe liz ue mab) is a monoclonal antibody. It is used to treat certain types of cancer. This medicine may be used for other purposes; ask your health care provider or pharmacist if you have questions. COMMON BRAND NAME(S): Keytruda What should I tell my health care provider before I take this medicine? They need to know if you have any of these conditions:  diabetes  immune system problems  inflammatory bowel disease  liver disease  lung or breathing disease  lupus  received or scheduled to receive an organ transplant or a stem-cell transplant that uses donor stem cells  an unusual or allergic reaction to pembrolizumab, other medicines, foods, dyes, or preservatives  pregnant or trying to get pregnant  breast-feeding How should I use this medicine? This medicine is for infusion into a vein. It is given by a health care professional in a hospital or clinic setting. A special MedGuide will be given to you before each treatment. Be sure to read this information carefully each time. Talk to your pediatrician regarding the use of this medicine in children. While this drug may be prescribed for children as young as 6 months for selected conditions, precautions do apply. Overdosage: If you think you have taken too much of this medicine contact a poison control center or emergency room at once. NOTE: This medicine is only for you. Do not share this medicine with others. What if I miss a dose? It is important not to miss your dose. Call your doctor or health care professional if you are unable to keep an appointment. What may interact with this medicine? Interactions have not been studied. Give your health care provider a list of all the medicines, herbs, non-prescription drugs, or dietary supplements you use. Also tell them if you smoke, drink alcohol, or use illegal drugs. Some items may interact with your medicine. This  list may not describe all possible interactions. Give your health care provider a list of all the medicines, herbs, non-prescription drugs, or dietary supplements you use. Also tell them if you smoke, drink alcohol, or use illegal drugs. Some items may interact with your medicine. What should I watch for while using this medicine? Your condition will be monitored carefully while you are receiving this medicine. You may need blood work done while you are taking this medicine. Do not become pregnant while taking this medicine or for 4 months after stopping it. Women should inform their doctor if they wish to become pregnant or think they might be pregnant. There is a potential for serious side effects to an unborn child. Talk to your health care professional or pharmacist for more information. Do not breast-feed an infant while taking this medicine or for 4 months after the last dose. What side effects may I notice from receiving this medicine? Side effects that you should report to your doctor or health care professional as soon as possible:  allergic reactions like skin rash, itching or hives, swelling of the face, lips, or tongue  bloody or black, tarry  breathing problems  changes in vision  chest pain  chills  confusion  constipation  cough  diarrhea  dizziness or feeling faint or lightheaded  fast or irregular heartbeat  fever  flushing  joint pain  low blood counts - this medicine may decrease the number of white blood cells, red blood cells and platelets. You may be at increased risk for infections and bleeding.  muscle pain  muscle  weakness  pain, tingling, numbness in the hands or feet  persistent headache  redness, blistering, peeling or loosening of the skin, including inside the mouth  signs and symptoms of high blood sugar such as dizziness; dry mouth; dry skin; fruity breath; nausea; stomach pain; increased hunger or thirst; increased urination  signs  and symptoms of kidney injury like trouble passing urine or change in the amount of urine  signs and symptoms of liver injury like dark urine, light-colored stools, loss of appetite, nausea, right upper belly pain, yellowing of the eyes or skin  sweating  swollen lymph nodes  weight loss Side effects that usually do not require medical attention (report to your doctor or health care professional if they continue or are bothersome):  decreased appetite  hair loss  muscle pain  tiredness This list may not describe all possible side effects. Call your doctor for medical advice about side effects. You may report side effects to FDA at 1-800-FDA-1088. Where should I keep my medicine? This drug is given in a hospital or clinic and will not be stored at home. NOTE: This sheet is a summary. It may not cover all possible information. If you have questions about this medicine, talk to your doctor, pharmacist, or health care provider.  2020 Elsevier/Gold Standard (2019-08-30 18:07:58) Pemetrexed injection What is this medicine? PEMETREXED (PEM e TREX ed) is a chemotherapy drug used to treat lung cancers like non-small cell lung cancer and mesothelioma. It may also be used to treat other cancers. This medicine may be used for other purposes; ask your health care provider or pharmacist if you have questions. COMMON BRAND NAME(S): Alimta What should I tell my health care provider before I take this medicine? They need to know if you have any of these conditions:  infection (especially a virus infection such as chickenpox, cold sores, or herpes)  kidney disease  low blood counts, like low white cell, platelet, or red cell counts  lung or breathing disease, like asthma  radiation therapy  an unusual or allergic reaction to pemetrexed, other medicines, foods, dyes, or preservative  pregnant or trying to get pregnant  breast-feeding How should I use this medicine? This drug is given as  an infusion into a vein. It is administered in a hospital or clinic by a specially trained health care professional. Talk to your pediatrician regarding the use of this medicine in children. Special care may be needed. Overdosage: If you think you have taken too much of this medicine contact a poison control center or emergency room at once. NOTE: This medicine is only for you. Do not share this medicine with others. What if I miss a dose? It is important not to miss your dose. Call your doctor or health care professional if you are unable to keep an appointment. What may interact with this medicine? This medicine may interact with the following medications:  Ibuprofen This list may not describe all possible interactions. Give your health care provider a list of all the medicines, herbs, non-prescription drugs, or dietary supplements you use. Also tell them if you smoke, drink alcohol, or use illegal drugs. Some items may interact with your medicine. What should I watch for while using this medicine? Visit your doctor for checks on your progress. This drug may make you feel generally unwell. This is not uncommon, as chemotherapy can affect healthy cells as well as cancer cells. Report any side effects. Continue your course of treatment even though you feel ill unless  your doctor tells you to stop. In some cases, you may be given additional medicines to help with side effects. Follow all directions for their use. Call your doctor or health care professional for advice if you get a fever, chills or sore throat, or other symptoms of a cold or flu. Do not treat yourself. This drug decreases your body's ability to fight infections. Try to avoid being around people who are sick. This medicine may increase your risk to bruise or bleed. Call your doctor or health care professional if you notice any unusual bleeding. Be careful brushing and flossing your teeth or using a toothpick because you may get an  infection or bleed more easily. If you have any dental work done, tell your dentist you are receiving this medicine. Avoid taking products that contain aspirin, acetaminophen, ibuprofen, naproxen, or ketoprofen unless instructed by your doctor. These medicines may hide a fever. Call your doctor or health care professional if you get diarrhea or mouth sores. Do not treat yourself. To protect your kidneys, drink water or other fluids as directed while you are taking this medicine. Do not become pregnant while taking this medicine or for 6 months after stopping it. Women should inform their doctor if they wish to become pregnant or think they might be pregnant. Men should not father a child while taking this medicine and for 3 months after stopping it. This may interfere with the ability to father a child. You should talk to your doctor or health care professional if you are concerned about your fertility. There is a potential for serious side effects to an unborn child. Talk to your health care professional or pharmacist for more information. Do not breast-feed an infant while taking this medicine or for 1 week after stopping it. What side effects may I notice from receiving this medicine? Side effects that you should report to your doctor or health care professional as soon as possible:  allergic reactions like skin rash, itching or hives, swelling of the face, lips, or tongue  breathing problems  redness, blistering, peeling or loosening of the skin, including inside the mouth  signs and symptoms of bleeding such as bloody or black, tarry stools; red or dark-brown urine; spitting up blood or brown material that looks like coffee grounds; red spots on the skin; unusual bruising or bleeding from the eye, gums, or nose  signs and symptoms of infection like fever or chills; cough; sore throat; pain or trouble passing urine  signs and symptoms of kidney injury like trouble passing urine or change in the  amount of urine  signs and symptoms of liver injury like dark yellow or brown urine; general ill feeling or flu-like symptoms; light-colored stools; loss of appetite; nausea; right upper belly pain; unusually weak or tired; yellowing of the eyes or skin Side effects that usually do not require medical attention (report to your doctor or health care professional if they continue or are bothersome):  constipation  mouth sores  nausea, vomiting  unusually weak or tired This list may not describe all possible side effects. Call your doctor for medical advice about side effects. You may report side effects to FDA at 1-800-FDA-1088. Where should I keep my medicine? This drug is given in a hospital or clinic and will not be stored at home. NOTE: This sheet is a summary. It may not cover all possible information. If you have questions about this medicine, talk to your doctor, pharmacist, or health care provider.  2020  Elsevier/Gold Standard (2017-12-13 16:11:33) Carboplatin injection What is this medicine? CARBOPLATIN (KAR boe pla tin) is a chemotherapy drug. It targets fast dividing cells, like cancer cells, and causes these cells to die. This medicine is used to treat ovarian cancer and many other cancers. This medicine may be used for other purposes; ask your health care provider or pharmacist if you have questions. COMMON BRAND NAME(S): Paraplatin What should I tell my health care provider before I take this medicine? They need to know if you have any of these conditions:  blood disorders  hearing problems  kidney disease  recent or ongoing radiation therapy  an unusual or allergic reaction to carboplatin, cisplatin, other chemotherapy, other medicines, foods, dyes, or preservatives  pregnant or trying to get pregnant  breast-feeding How should I use this medicine? This drug is usually given as an infusion into a vein. It is administered in a hospital or clinic by a specially  trained health care professional. Talk to your pediatrician regarding the use of this medicine in children. Special care may be needed. Overdosage: If you think you have taken too much of this medicine contact a poison control center or emergency room at once. NOTE: This medicine is only for you. Do not share this medicine with others. What if I miss a dose? It is important not to miss a dose. Call your doctor or health care professional if you are unable to keep an appointment. What may interact with this medicine?  medicines for seizures  medicines to increase blood counts like filgrastim, pegfilgrastim, sargramostim  some antibiotics like amikacin, gentamicin, neomycin, streptomycin, tobramycin  vaccines Talk to your doctor or health care professional before taking any of these medicines:  acetaminophen  aspirin  ibuprofen  ketoprofen  naproxen This list may not describe all possible interactions. Give your health care provider a list of all the medicines, herbs, non-prescription drugs, or dietary supplements you use. Also tell them if you smoke, drink alcohol, or use illegal drugs. Some items may interact with your medicine. What should I watch for while using this medicine? Your condition will be monitored carefully while you are receiving this medicine. You will need important blood work done while you are taking this medicine. This drug may make you feel generally unwell. This is not uncommon, as chemotherapy can affect healthy cells as well as cancer cells. Report any side effects. Continue your course of treatment even though you feel ill unless your doctor tells you to stop. In some cases, you may be given additional medicines to help with side effects. Follow all directions for their use. Call your doctor or health care professional for advice if you get a fever, chills or sore throat, or other symptoms of a cold or flu. Do not treat yourself. This drug decreases your body's  ability to fight infections. Try to avoid being around people who are sick. This medicine may increase your risk to bruise or bleed. Call your doctor or health care professional if you notice any unusual bleeding. Be careful brushing and flossing your teeth or using a toothpick because you may get an infection or bleed more easily. If you have any dental work done, tell your dentist you are receiving this medicine. Avoid taking products that contain aspirin, acetaminophen, ibuprofen, naproxen, or ketoprofen unless instructed by your doctor. These medicines may hide a fever. Do not become pregnant while taking this medicine. Women should inform their doctor if they wish to become pregnant or think they might  be pregnant. There is a potential for serious side effects to an unborn child. Talk to your health care professional or pharmacist for more information. Do not breast-feed an infant while taking this medicine. What side effects may I notice from receiving this medicine? Side effects that you should report to your doctor or health care professional as soon as possible:  allergic reactions like skin rash, itching or hives, swelling of the face, lips, or tongue  signs of infection - fever or chills, cough, sore throat, pain or difficulty passing urine  signs of decreased platelets or bleeding - bruising, pinpoint red spots on the skin, black, tarry stools, nosebleeds  signs of decreased red blood cells - unusually weak or tired, fainting spells, lightheadedness  breathing problems  changes in hearing  changes in vision  chest pain  high blood pressure  low blood counts - This drug may decrease the number of white blood cells, red blood cells and platelets. You may be at increased risk for infections and bleeding.  nausea and vomiting  pain, swelling, redness or irritation at the injection site  pain, tingling, numbness in the hands or feet  problems with balance, talking,  walking  trouble passing urine or change in the amount of urine Side effects that usually do not require medical attention (report to your doctor or health care professional if they continue or are bothersome):  hair loss  loss of appetite  metallic taste in the mouth or changes in taste This list may not describe all possible side effects. Call your doctor for medical advice about side effects. You may report side effects to FDA at 1-800-FDA-1088. Where should I keep my medicine? This drug is given in a hospital or clinic and will not be stored at home. NOTE: This sheet is a summary. It may not cover all possible information. If you have questions about this medicine, talk to your doctor, pharmacist, or health care provider.  2020 Elsevier/Gold Standard (2008-01-29 14:38:05) Lung Cancer Lung cancer is an abnormal growth of cancerous cells that forms a mass (malignant tumor) in a lung. There are several types of lung cancer. The types are based on the appearance of the tumor cells. The two most common types are:  Non-small cell lung cancer. This type of lung cancer is the most common type. Non-small cell lung cancers include squamous cell carcinoma, adenocarcinoma, and large cell carcinoma.  Small cell lung cancer. In this type of lung cancer, abnormal cells are smaller than those of non-small cell lung cancer. Small cell lung cancer gets worse (progresses) faster than non-small cell lung cancer. What are the causes? The most common cause of lung cancer is smoking tobacco. The second most common cause is exposure to a chemical called radon. What increases the risk? You are more likely to develop this condition if:  You smoke tobacco.  You have been exposed to: ? Secondhand tobacco smoke. ? Radon gas. ? Uranium. ? Asbestos. ? Arsenic in drinking water. ? Air pollution.  You have a family or personal history of lung cancer.  You have had lung radiation therapy in the past.  You  are older than age 11. What are the signs or symptoms? In the early stages, you may not have any symptoms. As the cancer progresses, symptoms may include:  A lasting cough, possibly with blood.  Fatigue.  Unexplained weight loss.  Shortness of breath.  Loud breathing (wheezing).  Chest pain.  Loss of appetite. Symptoms of advanced lung cancer  include:  Hoarseness.  Bone or joint pain.  Weakness.  Change in the structure of the fingernails (clubbing), so that the nail looks like an upside-down spoon.  Swelling of the face or arms.  Inability to move the face (paralysis).  Drooping eyelids. How is this diagnosed? This condition may be diagnosed based on:  Your symptoms and medical history.  A physical exam.  A chest X-ray.  A CT scan.  Blood tests.  Sputum tests.  Removal of a sample of lung tissue (lung biopsy) for testing. Your cancer will be assessed (staged) to determine how severe it is and how much it has spread (metastasized). How is this treated? Treatment depends on the type and stage of your cancer. Treatment may include one or more of the following:  Surgery to remove as much of the cancer as possible. Lymph nodes in the area may be removed and tested for cancer as well.  Medicines that kill cancer cells (chemotherapy).  High-energy rays that kill cancer cells (radiation therapy).  Chemotherapy. This treatment uses medicines to destroy cancer cells.  Targeted therapy. This targets specific parts of cancer cells and the area around them to block the growth and spread of the cancer. Targeted therapy can help limit the damage to healthy cells. Follow these instructions at home: Eating and drinking  Some of your treatments might affect your appetite. If you are having problems eating, or if you do not have an appetite, meet with a dietitian.  If you have side effects that affect your appetite, it may help to: ? Eat smaller meals and snacks  often. ? Drink high-nutrition and high-calorie shakes or supplements. ? Eat bland and soft foods that are easy to eat. ? Avoid eating foods that are hot, spicy, or hard to swallow. General instructions   Do not use any products that contain nicotine or tobacco, such as cigarettes and e-cigarettes. If you need help quitting, ask your health care provider.  Do not drink alcohol.  If you are admitted to the hospital, make sure your cancer specialist (oncologist) is aware. Your cancer may affect your treatment for other conditions.  Take over-the-counter and prescription medicines only as told by your health care provider.  Consider joining a support group for people who have been diagnosed with lung cancer.  Work with your health care provider to manage any side effects of treatment.  Keep all follow-up visits as told by your health care provider. This is important. Where to find more information  American Cancer Society: https://www.cancer.[place] ([place]): https://www.cancer.gov Contact a health care provider if you:  Lose weight without trying.  Have a persistent cough and wheezing.  Feel short of breath.  Get tired easily.  Have bone or joint pain.  Have difficulty swallowing.  Notice that your voice is changing or getting hoarse.  Have pain that does not get better with medicine. Get help right away if you:  Cough up blood.  Have new breathing problems.  Have chest pain.  Have a fever.  Have swelling in an ankle, leg, or arm, or the face or neck.  Have paralysis in your face.  Are very confused.  Have a drooping eyelid. Summary  Lung cancer is an abnormal growth of cancerous cells that forms a mass (malignant tumor) in a lung.  There are several types of lung cancer. The types are based on the appearance of the tumor cells. The two most common types are non-small cell and small cell.  The most common cause of lung cancer is  smoking tobacco.  Early symptoms include a lasting cough, possibly with blood, and fatigue, unexplained weight loss, and shortness of breath.  After diagnosis, treatment depends on the type and stage of your cancer. This information is not intended to replace advice given to you by your health care provider. Make sure you discuss any questions you have with your health care provider. Document Revised: 10/06/2017 Document Reviewed: 08/31/2017 Elsevier Patient Education  2020 [employer].

## 2019-12-25 NOTE — Progress Notes (Signed)
[place] [phone]   [phone]  OFFICE PROGRESS NOTE  [name], MD [address] [place]  [place] [address]  DIAGNOSIS: Stage IV (T2b, N3, M1a) non-small cell lung cancer, adenocarcinoma presented with large right upper lobe lung mass in addition to mediastinal and right supraclavicular lymphadenopathy as well as bilateral pulmonary nodules diagnosed in February 2021.  MOLECULAR STUDY by [facility]:  KRASG12C, 4.3%, Binimetinib  [id number], 0.9%, Niraparib, Olaparib, Rucaparib,Talazoparib, Tazemetostat  [id number], 1.7%, None   PRIOR THERAPY: None  CURRENT THERAPY: Systemic chemotherapy with carboplatin for AUC of 5, Alimta 500 mg/M2 and Keytruda 200 mg IV every 3 weeks.  First dose December 31, 2019.  INTERVAL HISTORY: Kevin Lambert. 64 y.o. male returns to the clinic today for follow-up visit accompanied by his sister.  The patient is feeling fine today with no concerning complaints except for generalized aching pain and arthralgia in the left hip area and knees.  He has no evidence of skeletal metastasis on the most recent PET scan.  He denied having any current chest pain, shortness of breath except with exertion with no cough or hemoptysis.  He denied having any fever or chills.  He has no nausea, vomiting, diarrhea or constipation.  He had molecular studies performed by [facility] [employer] and the patient is here today for evaluation and discussion of his treatment options based on the recent molecular studies.  MEDICAL HISTORY: Past Medical History:  Diagnosis Date  . Arthritis   . Depression   . DM (diabetes mellitus) ([employer])    type II  . Dyslipidemia   . Dyspnea    unable to walk much - he thinks it is from   . GERD (gastroesophageal reflux disease)   . Hepatitis    Hepatitis C- treated  . History of kidney stones   . HTN (hypertension)   . Neuropathy   . OSA on CPAP   . PTSD (post-traumatic stress disorder)      ALLERGIES:  is allergic to metformin and related.  MEDICATIONS:  Current Outpatient Medications  Medication Sig Dispense Refill  . amLODipine (NORVASC) 10 MG tablet Take 10 mg by mouth daily.    Marland Kitchen aspirin EC 81 MG tablet Take 81 mg by mouth daily.    Marland Kitchen atorvastatin (LIPITOR) 10 MG tablet Take 10 mg by mouth daily.    . calcium carbonate (TUMS - DOSED IN MG ELEMENTAL CALCIUM) 500 MG chewable tablet Chew 2 tablets by mouth daily as needed for indigestion or heartburn.    . dicyclomine (BENTYL) 20 MG tablet Take 1 tablet (20 mg total) by mouth 2 (two) times daily. 20 tablet 0  . glipiZIDE (GLUCOTROL) 5 MG tablet Take 5 mg by mouth daily before breakfast.     . LORazepam (ATIVAN) 0.5 MG tablet Take 0.5 mg by mouth daily as needed for anxiety.     . Menthol ([employer] LOZENGES MT) Use as directed 1 lozenge in the mouth or throat daily as needed (throat).     . sildenafil (VIAGRA) 100 MG tablet Take 100 mg by mouth daily as needed for erectile dysfunction.     No current facility-administered medications for this visit.    SURGICAL HISTORY:  Past Surgical History:  Procedure Laterality Date  . BIOPSY OF MEDIASTINAL MASS  12/10/2019   Procedure: BIOPSY OF MEDIASTINAL MASS;  Surgeon: Garner Nash, DO;  Location: [facility];  Service: Pulmonary;;  right upper lobe  . BRONCHIAL NEEDLE ASPIRATION BIOPSY  12/10/2019   Procedure: BRONCHIAL NEEDLE ASPIRATION BIOPSIES;  Surgeon: Garner Nash, DO;  Location: [facility];  Service: Pulmonary;;  . COLONOSCOPY    . kidney stone    . URETERAL STENT PLACEMENT    . Ureteral Stent Removed    . VIDEO BRONCHOSCOPY WITH ENDOBRONCHIAL ULTRASOUND N/A 12/10/2019   Procedure: VIDEO BRONCHOSCOPY WITH ENDOBRONCHIAL ULTRASOUND;  Surgeon: Garner Nash, DO;  Location: [facility];  Service: Pulmonary;  Laterality: N/A;    REVIEW OF SYSTEMS:  Constitutional: positive for fatigue Eyes: negative Ears, nose, mouth, throat, and face: negative Respiratory:  positive for dyspnea on exertion Cardiovascular: negative Gastrointestinal: negative Genitourinary:negative Integument/breast: negative Hematologic/lymphatic: negative Musculoskeletal:positive for arthralgias Neurological: negative Behavioral/Psych: negative Endocrine: negative Allergic/Immunologic: negative   PHYSICAL EXAMINATION: General appearance: alert, cooperative, fatigued and no distress Head: Normocephalic, without obvious abnormality, atraumatic Neck: no adenopathy, no JVD, supple, symmetrical, trachea midline and thyroid not enlarged, symmetric, no tenderness/mass/nodules Lymph nodes: Cervical, supraclavicular, and axillary nodes normal. Resp: clear to auscultation bilaterally Back: symmetric, no curvature. ROM normal. No CVA tenderness. Cardio: regular rate and rhythm, S1, S2 normal, no murmur, click, rub or gallop GI: soft, non-tender; bowel sounds normal; no masses,  no organomegaly Extremities: extremities normal, atraumatic, no cyanosis or edema Neurologic: Alert and oriented X 3, normal strength and tone. Normal symmetric reflexes. Normal coordination and gait  ECOG PERFORMANCE STATUS: 1 - Symptomatic but completely ambulatory  Blood pressure (!) 156/66, pulse 88, temperature 98 F (36.7 C), temperature source Temporal, resp. rate 18, height 5\' 9"  (1.753 m), weight 247 lb 3.2 oz (112.1 kg), SpO2 98 %.  LABORATORY DATA: Lab Results  Component Value Date   WBC 6.4 12/25/2019   HGB 14.0 12/25/2019   HCT 41.0 12/25/2019   MCV 93.2 12/25/2019   PLT 210 12/25/2019      Chemistry      Component Value Date/Time   NA 137 12/09/2019 1310   K 4.1 12/09/2019 1310   CL 101 12/09/2019 1310   CO2 26 12/09/2019 1310   BUN 18 12/09/2019 1310   CREATININE 0.78 12/09/2019 1310   CREATININE 0.80 11/29/2019 1103      Component Value Date/Time   CALCIUM 9.9 12/09/2019 1310   ALKPHOS 74 12/09/2019 1310   AST 19 12/09/2019 1310   AST 24 11/29/2019 1103   ALT 33  12/09/2019 1310   ALT 48 (H) 11/29/2019 1103   BILITOT 0.6 12/09/2019 1310   BILITOT 0.3 11/29/2019 1103       RADIOGRAPHIC STUDIES: MR BRAIN W WO CONTRAST  Result Date: 12/11/2019 CLINICAL DATA:  Non-small cell lung cancer, staging. EXAM: MRI HEAD WITHOUT AND WITH CONTRAST TECHNIQUE: Multiplanar, multiecho pulse sequences of the brain and surrounding structures were obtained without and with intravenous contrast. CONTRAST:  74mL GADAVIST GADOBUTROL 1 MMOL/ML IV SOLN COMPARISON:  None. FINDINGS: Study is partially degraded by motion. Brain: No acute infarction, hemorrhage, hydrocephalus, extra-axial collection or mass lesion. Subtle T2 hyperintensity is noted periventricular matter suggesting chronic small vessel disease. No focus of abnormal contrast enhancement seen. Vascular: Normal flow voids. Skull and upper cervical spine: Degenerative changes of the cervical spine are noted posterior disc osteophyte complex at C3-C4. Sinuses/Orbits: Mucous retention cyst is noted in the left maxillary antrum. Small bilateral mastoid effusions. Other: Partial empty sella. IMPRESSION: No evidence of intracranial metastatic disease. Electronically Signed   By: Pedro Earls M.D.   On: 12/11/2019 10:21   NM PET Image Initial (PI) Skull Base To Thigh  Result  Date: 12/09/2019 CLINICAL DATA:  Initial treatment strategy for non-small cell lung cancer. EXAM: NUCLEAR MEDICINE PET SKULL BASE TO THIGH TECHNIQUE: 12.5 mCi F-18 FDG was injected intravenously. Full-ring PET imaging was performed from the skull base to thigh after the radiotracer. CT data was obtained and used for attenuation correction and anatomic localization. Fasting blood glucose: 155 mg/dl COMPARISON:  None FINDINGS: Mediastinal blood pool activity: SUV max 3.59 Liver activity: SUV max NA NECK: No hypermetabolic lymph nodes in the neck. Incidental CT findings: none CHEST: Within the posterior right upper lobe there is a FDG avid lung mass  which measures approximately 5.0 x 4.4 cm and has an SUV max of 11.67. There is associated postobstructive pneumonitis within the posterior and lateral right upper lobe. FDG avid tumor appears to extend across the fissure to involve the superior segment of right lower lobe, image 37/8. There are several solid pulmonary nodules identified in both lungs. -Index nodule within the anteromedial left upper lobe measures 0.9 cm and has an SUV max of 1.7. -Superior segment left lower lobe lung nodule measures 1 cm with SUV max of 1.6. Enlarged right sided mediastinal, right hilar and subcarinal lymph nodes identified: -Right paratracheal lymph node measures 2.3 cm with SUV max of 8.9. -subcarinal lymph node measures 2.6 cm with SUV max of 7.58. -Within the right supraclavicular region there is a node measuring 1.1 cm short axis with SUV max of 2.92. Incidental CT findings: Aortic atherosclerosis. Coronary artery calcifications identified. ABDOMEN/PELVIS: Indeterminate low-attenuation right adrenal nodule measures 2.3 cm and 7.5 HU. No significant FDG uptake is identified within this nodule. No FDG avid lesions identified within the liver, pancreas, or spleen. No hypermetabolic abdominopelvic lymph nodes. Incidental CT findings: Aortic atherosclerosis. Stone within the left mid kidney measures 6 mm. Bladder wall thickening is identified. Right hydrocele. Fat containing inguinal hernias are noted, right greater than left. SKELETON: No focal hypermetabolic activity to suggest skeletal metastasis. Incidental CT findings: none IMPRESSION: 1. Posterior right upper lobe lung mass is FDG avid and concerning for primary bronchogenic carcinoma. There is associated postobstructive pneumonitis within the posterior and lateral right upper lobe. Tumor also appears to extend into the superior segment of right lower lobe. 2. FDG avid right hilar, subcarinal, right paratracheal lymph nodes are identified compatible with metastatic  adenopathy. Borderline enlarged right supraclavicular lymph node exhibits mild FDG uptake and is equivocal for nodal metastases. 3. Bilateral pulmonary nodules are identified, suspicious for metastatic disease. 4. No findings of metastasis to the abdomen or pelvis. 5. Low-density right adrenal nodule without significant FDG uptake is indeterminate but favored to represent a benign adenoma. 6. Aortic atherosclerosis and coronary artery calcifications. Electronically Signed   By: Kerby Moors M.D.   On: 12/09/2019 11:06   CT RENAL STONE STUDY  Result Date: 12/09/2019 CLINICAL DATA:  Left flank pain for 3 days. EXAM: CT ABDOMEN AND PELVIS WITHOUT CONTRAST TECHNIQUE: Multidetector CT imaging of the abdomen and pelvis was performed following the standard protocol without IV contrast. COMPARISON:  None. FINDINGS: Lower chest: No acute abnormality. Hepatobiliary: No focal liver abnormality is seen. No gallstones, gallbladder wall thickening, or biliary dilatation. Pancreas: Unremarkable. No pancreatic ductal dilatation or surrounding inflammatory changes. Spleen: Normal in size without focal abnormality. Adrenals/Urinary Tract: The bilateral adrenal glands are normal. There is no hydronephrosis bilaterally. Small nonobstructing stones are identified in both kidneys. Renal vascular calcifications are so noted bilaterally. There is a 1.8 cm low-density in the upper to midpole medial right kidney this may  be a cyst. Bladder is normal. Stomach/Bowel: Stomach is within normal limits. Appendix appears normal. No evidence of bowel wall thickening, distention, or inflammatory changes. Vascular/Lymphatic: Aortic atherosclerosis. No enlarged abdominal or pelvic lymph nodes. Reproductive: Prostate calcifications are identified. Other: Right inguinal herniation of mesenteric fat is noted. Musculoskeletal: Degenerative joint changes of the spine are identified. IMPRESSION: 1. Small nonobstructing stones in both kidneys. No  hydronephrosis is identified bilaterally. 2. No acute abnormality identified in the abdomen and pelvis. Electronically Signed   By: Abelardo Diesel M.D.   On: 12/09/2019 15:28    ASSESSMENT AND PLAN: This is a very pleasant 64 years old African-American male recently diagnosed with a stage IV non-small cell lung cancer, adenocarcinoma with no actionable mutations presented with large right lower lobe lung mass in addition to mediastinal and right supraclavicular lymphadenopathy as well as bilateral pulmonary nodules diagnosed in February 2021. I explained to the patient that he has incurable condition and all the treatment options will be of palliative nature. The patient has no actionable mutations on the recent molecular studies and he is not a candidate for the [facility] clinical trial. I discussed with the patient his treatment options including palliative care versus palliative systemic chemotherapy with carboplatin for AUC of 5, Alimta 500 mg/M2 and Keytruda 200 mg IV every 3 weeks.   He is interested in proceeding with systemic therapy.  He also indicated that he may moved to [place] in the future and may require referral to the [place] facility in [place].  He would like to start his treatment here for now. I discussed with the patient the adverse effect of this treatment including but not limited to alopecia, myelosuppression, nausea and vomiting, peripheral neuropathy, liver or renal dysfunction as well as the immunotherapy adverse effects.  He was given handout about his treatment. He is expected to start the first cycle of this treatment next week. I will arrange for the patient to receive vitamin B12 injection today.  I will also call his pharmacy with prescription for folic acid and Compazine as well as EMLA cream. I will refer the patient to interventional radiology for Port-A-Cath placement. The patient will have a chemotherapy education class before the first dose of his treatment. He will  come back for follow-up visit in 2 weeks for evaluation and management of any adverse effect of his treatment. The patient was advised to call immediately if he has any concerning symptoms in the interval. The patient voices understanding of current disease status and treatment options and is in agreement with the current care plan.  All questions were answered. The patient knows to call the clinic with any problems, questions or concerns. We can certainly see the patient much sooner if necessary.  Disclaimer: This note was dictated with voice recognition software. Similar sounding words can inadvertently be transcribed and may not be corrected upon review.

## 2019-12-25 NOTE — Progress Notes (Signed)
START ON PATHWAY REGIMEN - Non-Small Cell Lung     A cycle is every 21 days:     Pembrolizumab      Pemetrexed      Carboplatin   **Always confirm dose/schedule in your pharmacy ordering system**  Patient Characteristics: Stage IV Metastatic, Nonsquamous, Initial Chemotherapy/Immunotherapy, PS = 0, 1, ALK Rearrangement Negative and ROS1 Rearrangement Negative and NTRK Gene Fusion?Negative and RET Gene Fusion?Negative and EGFR Mutation Negative/Non?Sensitizing, PD-L1  Expression Positive 1-49% (TPS) / Negative / Not Tested / Awaiting Test Results and Immunotherapy Candidate Therapeutic Status: Stage IV Metastatic Histology: Nonsquamous Cell ROS1 Rearrangement Status: Negative Other Mutations/Biomarkers: No Other Actionable Mutations NTRK Gene Fusion Status: Negative PD-L1 Expression Status: Quantity Not Sufficient Chemotherapy/Immunotherapy LOT: Initial Chemotherapy/Immunotherapy Molecular Targeted Therapy: Not Appropriate MET Exon 14 Mutation Status: Negative RET Gene Fusion Status: Negative ALK Rearrangement Status: Negative EGFR Mutation Status: Negative/Wild Type BRAF [id number] Mutation Status: Negative ECOG Performance Status: 1 Biomarker Assessment Status Confirmation: All Genomic Markers Negative or Only MET+ or BRAF+ Immunotherapy Candidate Status: Candidate for Immunotherapy Intent of Therapy: Non-Curative / Palliative Intent, Discussed with Patient

## 2019-12-27 ENCOUNTER — Telehealth: Payer: Self-pay | Admitting: Internal Medicine

## 2019-12-27 ENCOUNTER — Telehealth: Payer: Self-pay | Admitting: Medical Oncology

## 2019-12-27 ENCOUNTER — Other Ambulatory Visit: Payer: Self-pay | Admitting: Medical Oncology

## 2019-12-27 DIAGNOSIS — Z5111 Encounter for antineoplastic chemotherapy: Secondary | ICD-10-CM

## 2019-12-27 MED ORDER — FOLIC ACID 1 MG PO TABS: 1.0000 mg | ORAL_TABLET | Freq: Every day | ORAL | 0 refills | Status: DC

## 2019-12-27 NOTE — Telephone Encounter (Signed)
Folic acid 7  Day supply-Spoke to  pharmacist , Willey Blade, at [facility] and called in folic acid rx ( 7 days ) per Dr Julien Nordmann .Pt is waiting on [place] prescription for folic acid and thinks there may be a delay in receiving it.

## 2019-12-27 NOTE — Telephone Encounter (Signed)
I told pt that  he should start the folic acid by Monday or as soon as he gets it. Mychart is set up on his sisters phone.

## 2019-12-27 NOTE — Telephone Encounter (Signed)
Scheduled appts per los. Called and spoke with patient. Patient did not want to take down all scheduled appts. Gave him appts for next week and patient will get printout while in the facility. Gave patient nurses number per his request to ask about meds.

## 2019-12-30 ENCOUNTER — Inpatient Hospital Stay: Payer: No Typology Code available for payment source

## 2019-12-30 ENCOUNTER — Other Ambulatory Visit: Payer: Self-pay | Admitting: Student

## 2019-12-31 ENCOUNTER — Ambulatory Visit (HOSPITAL_COMMUNITY)
Admission: RE | Admit: 2019-12-31 | Discharge: 2019-12-31 | Disposition: A | Discharge status: Home / Self Care | Payer: No Typology Code available for payment source | Source: Ambulatory Visit | Attending: Internal Medicine | Admitting: Internal Medicine

## 2019-12-31 ENCOUNTER — Encounter (HOSPITAL_COMMUNITY): Payer: Self-pay

## 2019-12-31 ENCOUNTER — Other Ambulatory Visit: Payer: Self-pay | Admitting: Internal Medicine

## 2019-12-31 ENCOUNTER — Other Ambulatory Visit: Payer: Self-pay

## 2019-12-31 DIAGNOSIS — C3491 Malignant neoplasm of unspecified part of right bronchus or lung: Secondary | ICD-10-CM | POA: Diagnosis not present

## 2019-12-31 DIAGNOSIS — M1990 Unspecified osteoarthritis, unspecified site: Secondary | ICD-10-CM | POA: Diagnosis not present

## 2019-12-31 DIAGNOSIS — F17210 Nicotine dependence, cigarettes, uncomplicated: Secondary | ICD-10-CM | POA: Diagnosis not present

## 2019-12-31 DIAGNOSIS — E785 Hyperlipidemia, unspecified: Secondary | ICD-10-CM | POA: Insufficient documentation

## 2019-12-31 DIAGNOSIS — Z5112 Encounter for antineoplastic immunotherapy: Secondary | ICD-10-CM

## 2019-12-31 DIAGNOSIS — Z8619 Personal history of other infectious and parasitic diseases: Secondary | ICD-10-CM | POA: Diagnosis not present

## 2019-12-31 DIAGNOSIS — F4310 Post-traumatic stress disorder, unspecified: Secondary | ICD-10-CM | POA: Insufficient documentation

## 2019-12-31 DIAGNOSIS — K219 Gastro-esophageal reflux disease without esophagitis: Secondary | ICD-10-CM | POA: Diagnosis not present

## 2019-12-31 DIAGNOSIS — E1140 Type 2 diabetes mellitus with diabetic neuropathy, unspecified: Secondary | ICD-10-CM | POA: Insufficient documentation

## 2019-12-31 DIAGNOSIS — Z79899 Other long term (current) drug therapy: Secondary | ICD-10-CM | POA: Diagnosis not present

## 2019-12-31 DIAGNOSIS — Z5111 Encounter for antineoplastic chemotherapy: Secondary | ICD-10-CM

## 2019-12-31 DIAGNOSIS — Z833 Family history of diabetes mellitus: Secondary | ICD-10-CM | POA: Insufficient documentation

## 2019-12-31 DIAGNOSIS — Z7982 Long term (current) use of aspirin: Secondary | ICD-10-CM | POA: Diagnosis not present

## 2019-12-31 DIAGNOSIS — I10 Essential (primary) hypertension: Secondary | ICD-10-CM | POA: Insufficient documentation

## 2019-12-31 DIAGNOSIS — G4733 Obstructive sleep apnea (adult) (pediatric): Secondary | ICD-10-CM | POA: Insufficient documentation

## 2019-12-31 HISTORY — PX: IR IMAGING GUIDED PORT INSERTION: IMG5740

## 2019-12-31 LAB — GLUCOSE, CAPILLARY: Glucose-Capillary: 119 mg/dL — ABNORMAL HIGH (ref 70–99)

## 2019-12-31 MED ORDER — LIDOCAINE-EPINEPHRINE 1 %-1:100000 IJ SOLN
INTRAMUSCULAR | Status: AC
  Filled 2019-12-31: qty 1

## 2019-12-31 MED ORDER — HEPARIN SOD (PORK) LOCK FLUSH 100 UNIT/ML IV SOLN
INTRAVENOUS | Status: AC
  Filled 2019-12-31: qty 5

## 2019-12-31 MED ORDER — FENTANYL CITRATE (PF) 100 MCG/2ML IJ SOLN
INTRAMUSCULAR | Status: AC
  Filled 2019-12-31: qty 2

## 2019-12-31 MED ORDER — LIDOCAINE-EPINEPHRINE (PF) 1 %-1:200000 IJ SOLN
INTRAMUSCULAR | Status: AC | PRN
  Administered 2019-12-31: 10 mL

## 2019-12-31 MED ORDER — MIDAZOLAM HCL 2 MG/2ML IJ SOLN
INTRAMUSCULAR | Status: AC | PRN
  Administered 2019-12-31: 1 mg via INTRAVENOUS
  Administered 2019-12-31: 0.5 mg via INTRAVENOUS

## 2019-12-31 MED ORDER — MIDAZOLAM HCL 2 MG/2ML IJ SOLN
INTRAMUSCULAR | Status: AC
  Filled 2019-12-31: qty 4

## 2019-12-31 MED ORDER — SODIUM CHLORIDE 0.9 % IV SOLN: INTRAVENOUS | Status: DC

## 2019-12-31 MED ORDER — FENTANYL CITRATE (PF) 100 MCG/2ML IJ SOLN
INTRAMUSCULAR | Status: AC | PRN
  Administered 2019-12-31 (×2): 50 ug via INTRAVENOUS

## 2019-12-31 MED ORDER — HEPARIN SOD (PORK) LOCK FLUSH 100 UNIT/ML IV SOLN
INTRAVENOUS | Status: AC | PRN
  Administered 2019-12-31: 500 [IU] via INTRAVENOUS

## 2019-12-31 MED ORDER — CEFAZOLIN SODIUM-DEXTROSE 2-4 GM/100ML-% IV SOLN: 2.0000 g | INTRAVENOUS | Status: AC

## 2019-12-31 MED ORDER — CEFAZOLIN SODIUM-DEXTROSE 2-4 GM/100ML-% IV SOLN
INTRAVENOUS | Status: AC
  Administered 2019-12-31: 2 g via INTRAVENOUS
  Filled 2019-12-31: qty 100

## 2019-12-31 NOTE — Procedures (Signed)
Lung ca  S/p RT IJ POWER PORT  TIP SVCRA NO COMP STABLE EBL MIN READY FOR USE FULL REPORT IN PACS

## 2019-12-31 NOTE — Discharge Instructions (Signed)
Please call Interventional Radiologist [phone] with any questions about your port.  You may remove your dressing and shower tomorrow.    DO NOT use EMLA cream for 2 weeks after port placement as this cream will remove surgical glue on your incision.  Moderate Conscious Sedation, Adult, Care After These instructions provide you with information about caring for yourself after your procedure. Your health care provider may also give you more specific instructions. Your treatment has been planned according to current medical practices, but problems sometimes occur. Call your health care provider if you have any problems or questions after your procedure. What can I expect after the procedure? After your procedure, it is common:  To feel sleepy for several hours.  To feel clumsy and have poor balance for several hours.  To have poor judgment for several hours.  To vomit if you eat too soon. Follow these instructions at home: For at least 24 hours after the procedure:   Do not: ? Participate in activities where you could fall or become injured. ? Drive. ? Use heavy machinery. ? Drink alcohol. ? Take sleeping pills or medicines that cause drowsiness. ? Make important decisions or sign legal documents. ? Take care of children on your own.  Rest. Eating and drinking  Follow the diet recommended by your health care provider.  If you vomit: ? Drink water, juice, or soup when you can drink without vomiting. ? Make sure you have little or no nausea before eating solid foods. General instructions  Have a responsible adult stay with you until you are awake and alert.  Take over-the-counter and prescription medicines only as told by your health care provider.  If you smoke, do not smoke without supervision.  Keep all follow-up visits as told by your health care provider. This is important. Contact a health care provider if:  You keep feeling nauseous or you keep  vomiting.  You feel light-headed.  You develop a rash.  You have a fever. Get help right away if:  You have trouble breathing. This information is not intended to replace advice given to you by your health care provider. Make sure you discuss any questions you have with your health care provider. Document Revised: 10/06/2017 Document Reviewed: 02/13/2016 Elsevier Patient Education  [place] Insertion, Care After This sheet gives you information about how to care for yourself after your procedure. Your health care provider may also give you more specific instructions. If you have problems or questions, contact your health care provider. What can I expect after the procedure? After the procedure, it is common to have:  Discomfort at the port insertion site.  Bruising on the skin over the port. This should improve over 3-4 days. Follow these instructions at home: [facility] care  After your port is placed, you will get a manufacturer's information card. The card has information about your port. Keep this card with you at all times.  Take care of the port as told by your health care provider. Ask your health care provider if you or a family member can get training for taking care of the port at home. A home health care nurse may also take care of the port.  Make sure to remember what type of port you have. Incision care      Follow instructions from your health care provider about how to take care of your port insertion site. Make sure you: ? Wash your hands with soap and water  before and after you change your bandage (dressing). If soap and water are not available, use hand sanitizer. ? Change your dressing as told by your health care provider. ? Leave stitches (sutures), skin glue, or adhesive strips in place. These skin closures may need to stay in place for 2 weeks or longer. If adhesive strip edges start to loosen and curl up, you may trim the loose  edges. Do not remove adhesive strips completely unless your health care provider tells you to do that.  Check your port insertion site every day for signs of infection. Check for: ? Redness, swelling, or pain. ? Fluid or blood. ? Warmth. ? Pus or a bad smell. Activity  Return to your normal activities as told by your health care provider. Ask your health care provider what activities are safe for you.  Do not lift anything that is heavier than 10 lb (4.5 kg), or the limit that you are told, until your health care provider says that it is safe. General instructions  Take over-the-counter and prescription medicines only as told by your health care provider.  Do not take baths, swim, or use a hot tub until your health care provider approves. Ask your health care provider if you may take showers. You may only be allowed to take sponge baths.  Do not drive for 24 hours if you were given a sedative during your procedure.  Wear a medical alert bracelet in case of an emergency. This will tell any health care providers that you have a port.  Keep all follow-up visits as told by your health care provider. This is important. Contact a health care provider if:  You cannot flush your port with saline as directed, or you cannot draw blood from the port.  You have a fever or chills.  You have redness, swelling, or pain around your port insertion site.  You have fluid or blood coming from your port insertion site.  Your port insertion site feels warm to the touch.  You have pus or a bad smell coming from the port insertion site. Get help right away if:  You have chest pain or shortness of breath.  You have bleeding from your port that you cannot control. Summary  Take care of the port as told by your health care provider. Keep the manufacturer's information card with you at all times.  Change your dressing as told by your health care provider.  Contact a health care provider if you  have a fever or chills or if you have redness, swelling, or pain around your port insertion site.  Keep all follow-up visits as told by your health care provider. This information is not intended to replace advice given to you by your health care provider. Make sure you discuss any questions you have with your health care provider. Document Revised: 05/22/2018 Document Reviewed: 05/22/2018 Elsevier Patient Education  [place].

## 2019-12-31 NOTE — Consult Note (Signed)
Chief Complaint: Patient was seen in consultation today for Port-A-Cath placement  Referring Physician(s): Mohamed,Mohamed  Supervising Physician: Daryll Brod  Patient Status: [facility] [place] - Out-pt  History of Present Illness: Kevin Lambert. is a 64 y.o. male smoker with history of stage IV adenocarcinoma of lung, who presented with large right upper lobe lung mass in addition to mediastinal and right supraclavicular lymphadenopathy as well as bilateral pulmonary nodules diagnosed this month.  He presents today for Port-A-Cath placement for palliative chemotherapy.  Past Medical History:  Diagnosis Date  . Arthritis   . Depression   . DM (diabetes mellitus) ([place])    type II  . Dyslipidemia   . Dyspnea    unable to walk much - he thinks it is from   . GERD (gastroesophageal reflux disease)   . Hepatitis    Hepatitis C- treated  . History of kidney stones   . HTN (hypertension)   . Neuropathy   . OSA on CPAP   . PTSD (post-traumatic stress disorder)     Past Surgical History:  Procedure Laterality Date  . BIOPSY OF MEDIASTINAL MASS  12/10/2019   Procedure: BIOPSY OF MEDIASTINAL MASS;  Surgeon: Garner Nash, DO;  Location: [facility];  Service: Pulmonary;;  right upper lobe  . BRONCHIAL NEEDLE ASPIRATION BIOPSY  12/10/2019   Procedure: BRONCHIAL NEEDLE ASPIRATION BIOPSIES;  Surgeon: Garner Nash, DO;  Location: [facility];  Service: Pulmonary;;  . COLONOSCOPY    . kidney stone    . URETERAL STENT PLACEMENT    . Ureteral Stent Removed    . VIDEO BRONCHOSCOPY WITH ENDOBRONCHIAL ULTRASOUND N/A 12/10/2019   Procedure: VIDEO BRONCHOSCOPY WITH ENDOBRONCHIAL ULTRASOUND;  Surgeon: Garner Nash, DO;  Location: [facility];  Service: Pulmonary;  Laterality: N/A;    Allergies: Metformin and related  Medications: Prior to Admission medications   Medication Sig Start Date End Date Taking? Authorizing Provider  amLODipine (NORVASC) 10 MG tablet Take 10 mg by mouth  daily.   Yes [provider]  atorvastatin (LIPITOR) 10 MG tablet Take 10 mg by mouth daily.   Yes [provider]  calcium carbonate (TUMS - DOSED IN MG ELEMENTAL CALCIUM) 500 MG chewable tablet Chew 2 tablets by mouth daily as needed for indigestion or heartburn.   Yes [provider]  folic acid (FOLVITE) 1 MG tablet Take 1 tablet (1 mg total) by mouth daily. 12/25/19  Yes Curt Bears, MD  glipiZIDE (GLUCOTROL) 5 MG tablet Take 5 mg by mouth daily before breakfast.    Yes [provider]  LORazepam (ATIVAN) 0.5 MG tablet Take 0.5 mg by mouth daily as needed for anxiety.    Yes [provider]  Menthol ([employer] LOZENGES MT) Use as directed 1 lozenge in the mouth or throat daily as needed (throat).    Yes [provider]  sildenafil (VIAGRA) 100 MG tablet Take 100 mg by mouth daily as needed for erectile dysfunction.   Yes [provider]  triamterene-hydrochlorothiazide (MAXZIDE-25) 37.5-25 MG tablet Take 1 tablet by mouth daily as needed.   Yes [provider]  aspirin EC 81 MG tablet Take 81 mg by mouth daily.    [provider]  dicyclomine (BENTYL) 20 MG tablet Take 1 tablet (20 mg total) by mouth 2 (two) times daily. 12/09/19   Maudie Flakes, MD  folic acid (FOLVITE) 1 MG tablet Take 1 tablet (1 mg total) by mouth daily. Pt needs to pick up today and start  ASAP . He will pay out of pocket. He is waiting on the [place] to send his folic acid which will be next week. 12/27/19   Heilingoetter, Cassandra L, PA-C  lidocaine-prilocaine (EMLA) cream Apply to Port-A-Cath site 30-60-minute before treatment. 12/25/19   Curt Bears, MD  prochlorperazine (COMPAZINE) 10 MG tablet Take 1 tablet (10 mg total) by mouth every 6 (six) hours as needed for nausea or vomiting. 12/25/19   Curt Bears, MD     Family History  Problem Relation Age of Onset  . Liver disease Mother   . Diabetes Father     Social History    Socioeconomic History  . Marital status: Single    Spouse name: Not on file  . Number of children: Not on file  . Years of education: Not on file  . Highest education level: Not on file  Occupational History  . Not on file  Tobacco Use  . Smoking status: Current Every Day Smoker    Packs/day: 0.60    Years: 50.00    Pack years: 30.00    Types: Cigarettes  . Smokeless tobacco: Never Used  Substance and Sexual Activity  . Alcohol use: Yes    Alcohol/week: 6.0 standard drinks    Types: 6 Cans of beer per week  . Drug use: Not Currently  . Sexual activity: Not on file  Other Topics Concern  . Not on file  Social History Narrative  . Not on file   Social Determinants of Health   Financial Resource Strain:   . Difficulty of Paying Living Expenses: Not on file  Food Insecurity:   . Worried About Charity fundraiser in the Last Year: Not on file  . Ran Out of Food in the Last Year: Not on file  Transportation Needs:   . Lack of Transportation (Medical): Not on file  . Lack of Transportation (Non-Medical): Not on file  Physical Activity:   . Days of Exercise per Week: Not on file  . Minutes of Exercise per Session: Not on file  Stress:   . Feeling of Stress : Not on file  Social Connections:   . Frequency of Communication with Friends and Family: Not on file  . Frequency of Social Gatherings with Friends and Family: Not on file  . Attends Religious Services: Not on file  . Active Member of Clubs or Organizations: Not on file  . Attends Archivist Meetings: Not on file  . Marital Status: Not on file      Review of Systems currently denies fever, headache, chest pain, abdominal pain, nausea, vomiting or bleeding.  Does have some dyspnea with exertion, occasional cough, intermittent back pain.  Vital Signs: Blood pressure 164/84, temperature 97.8, heart rate 83, respirations 18, O2 sat 97% room air   Physical Exam awake, alert.  Chest with distant breath  sounds bilaterally.  Heart with regular rate and rhythm.  Abdomen obese, soft, positive bowel sounds, nontender.  Trace pretibial edema bilaterally.  Imaging: MR BRAIN W WO CONTRAST  Result Date: 12/11/2019 CLINICAL DATA:  Non-small cell lung cancer, staging. EXAM: MRI HEAD WITHOUT AND WITH CONTRAST TECHNIQUE: Multiplanar, multiecho pulse sequences of the brain and surrounding structures were obtained without and with intravenous contrast. CONTRAST:  42mL GADAVIST GADOBUTROL 1 MMOL/ML IV SOLN COMPARISON:  None. FINDINGS: Study is partially degraded by motion. Brain: No acute infarction, hemorrhage, hydrocephalus, extra-axial collection or mass lesion. Subtle T2 hyperintensity is noted periventricular matter suggesting chronic small vessel disease. No  focus of abnormal contrast enhancement seen. Vascular: Normal flow voids. Skull and upper cervical spine: Degenerative changes of the cervical spine are noted posterior disc osteophyte complex at C3-C4. Sinuses/Orbits: Mucous retention cyst is noted in the left maxillary antrum. Small bilateral mastoid effusions. Other: Partial empty sella. IMPRESSION: No evidence of intracranial metastatic disease. Electronically Signed   By: Pedro Earls M.D.   On: 12/11/2019 10:21   NM PET Image Initial (PI) Skull Base To Thigh  Result Date: 12/09/2019 CLINICAL DATA:  Initial treatment strategy for non-small cell lung cancer. EXAM: NUCLEAR MEDICINE PET SKULL BASE TO THIGH TECHNIQUE: 12.5 mCi F-18 FDG was injected intravenously. Full-ring PET imaging was performed from the skull base to thigh after the radiotracer. CT data was obtained and used for attenuation correction and anatomic localization. Fasting blood glucose: 155 mg/dl COMPARISON:  None FINDINGS: Mediastinal blood pool activity: SUV max 3.59 Liver activity: SUV max NA NECK: No hypermetabolic lymph nodes in the neck. Incidental CT findings: none CHEST: Within the posterior right upper lobe there is a  FDG avid lung mass which measures approximately 5.0 x 4.4 cm and has an SUV max of 11.67. There is associated postobstructive pneumonitis within the posterior and lateral right upper lobe. FDG avid tumor appears to extend across the fissure to involve the superior segment of right lower lobe, image 37/8. There are several solid pulmonary nodules identified in both lungs. -Index nodule within the anteromedial left upper lobe measures 0.9 cm and has an SUV max of 1.7. -Superior segment left lower lobe lung nodule measures 1 cm with SUV max of 1.6. Enlarged right sided mediastinal, right hilar and subcarinal lymph nodes identified: -Right paratracheal lymph node measures 2.3 cm with SUV max of 8.9. -subcarinal lymph node measures 2.6 cm with SUV max of 7.58. -Within the right supraclavicular region there is a node measuring 1.1 cm short axis with SUV max of 2.92. Incidental CT findings: Aortic atherosclerosis. Coronary artery calcifications identified. ABDOMEN/PELVIS: Indeterminate low-attenuation right adrenal nodule measures 2.3 cm and 7.5 HU. No significant FDG uptake is identified within this nodule. No FDG avid lesions identified within the liver, pancreas, or spleen. No hypermetabolic abdominopelvic lymph nodes. Incidental CT findings: Aortic atherosclerosis. Stone within the left mid kidney measures 6 mm. Bladder wall thickening is identified. Right hydrocele. Fat containing inguinal hernias are noted, right greater than left. SKELETON: No focal hypermetabolic activity to suggest skeletal metastasis. Incidental CT findings: none IMPRESSION: 1. Posterior right upper lobe lung mass is FDG avid and concerning for primary bronchogenic carcinoma. There is associated postobstructive pneumonitis within the posterior and lateral right upper lobe. Tumor also appears to extend into the superior segment of right lower lobe. 2. FDG avid right hilar, subcarinal, right paratracheal lymph nodes are identified compatible with  metastatic adenopathy. Borderline enlarged right supraclavicular lymph node exhibits mild FDG uptake and is equivocal for nodal metastases. 3. Bilateral pulmonary nodules are identified, suspicious for metastatic disease. 4. No findings of metastasis to the abdomen or pelvis. 5. Low-density right adrenal nodule without significant FDG uptake is indeterminate but favored to represent a benign adenoma. 6. Aortic atherosclerosis and coronary artery calcifications. Electronically Signed   By: Kerby Moors M.D.   On: 12/09/2019 11:06   CT RENAL STONE STUDY  Result Date: 12/09/2019 CLINICAL DATA:  Left flank pain for 3 days. EXAM: CT ABDOMEN AND PELVIS WITHOUT CONTRAST TECHNIQUE: Multidetector CT imaging of the abdomen and pelvis was performed following the standard protocol without IV contrast. COMPARISON:  None. FINDINGS: Lower chest: No acute abnormality. Hepatobiliary: No focal liver abnormality is seen. No gallstones, gallbladder wall thickening, or biliary dilatation. Pancreas: Unremarkable. No pancreatic ductal dilatation or surrounding inflammatory changes. Spleen: Normal in size without focal abnormality. Adrenals/Urinary Tract: The bilateral adrenal glands are normal. There is no hydronephrosis bilaterally. Small nonobstructing stones are identified in both kidneys. Renal vascular calcifications are so noted bilaterally. There is a 1.8 cm low-density in the upper to midpole medial right kidney this may be a cyst. Bladder is normal. Stomach/Bowel: Stomach is within normal limits. Appendix appears normal. No evidence of bowel wall thickening, distention, or inflammatory changes. Vascular/Lymphatic: Aortic atherosclerosis. No enlarged abdominal or pelvic lymph nodes. Reproductive: Prostate calcifications are identified. Other: Right inguinal herniation of mesenteric fat is noted. Musculoskeletal: Degenerative joint changes of the spine are identified. IMPRESSION: 1. Small nonobstructing stones in both kidneys.  No hydronephrosis is identified bilaterally. 2. No acute abnormality identified in the abdomen and pelvis. Electronically Signed   By: Abelardo Diesel M.D.   On: 12/09/2019 15:28    Labs:  CBC: Recent Labs    11/29/19 1103 12/09/19 1310 12/25/19 1116  WBC 6.6 8.3 6.4  HGB 13.5 14.3 14.0  HCT 39.0 42.1 41.0  PLT 181 208 210    COAGS: Recent Labs    12/10/19 0904  INR 1.1  APTT 34    BMP: Recent Labs    11/29/19 1103 12/09/19 1310 12/25/19 1116  NA 137 137 139  K 4.3 4.1 4.1  CL 104 101 103  CO2 25 26 28   GLUCOSE 179* 136* 113*  BUN 17 18 16   CALCIUM 9.1 9.9 10.2  CREATININE 0.80 0.78 0.79  GFRNONAA >60 >60 >60  GFRAA >60 >60 >60    LIVER FUNCTION TESTS: Recent Labs    11/29/19 1103 12/09/19 1310 12/25/19 1116  BILITOT 0.3 0.6 0.2*  AST 24 19 18   ALT 48* 33 40  ALKPHOS 80 74 77  PROT 6.6 7.2 6.8  ALBUMIN 3.7 4.0 3.8    TUMOR MARKERS: No results for input(s): AFPTM, CEA, CA199, CHROMGRNA in the last 8760 hours.  Assessment and Plan: 64 y.o. male smoker with history of stage IV adenocarcinoma of lung, who presented with large right upper lobe lung mass in addition to mediastinal and right supraclavicular lymphadenopathy as well as bilateral pulmonary nodules diagnosed this month.  He presents today for Port-A-Cath placement for palliative chemotherapy.Risks and benefits of image guided port-a-catheter placement was discussed with the patient including, but not limited to bleeding, infection, pneumothorax, or fibrin sheath development and need for additional procedures.  All of the patient's questions were answered, patient is agreeable to proceed. Consent signed and in chart.      Thank you for this interesting consult.  I greatly enjoyed meeting Kevin Lambert. and look forward to participating in their care.  A copy of this report was sent to the requesting provider on this date.  Electronically Signed: D. Rowe Robert, PA-C 12/31/2019, 2:02  PM   I spent a total of 25 minutes in face to face in clinical consultation, greater than 50% of which was counseling/coordinating care for Port-A-Cath placement

## 2020-01-01 ENCOUNTER — Inpatient Hospital Stay: Payer: No Typology Code available for payment source

## 2020-01-01 ENCOUNTER — Other Ambulatory Visit: Payer: Self-pay

## 2020-01-01 VITALS — BP 159/69 | HR 70 | Temp 98.7°F | Resp 20 | Ht 69.0 in | Wt 244.2 lb

## 2020-01-01 DIAGNOSIS — Z95828 Presence of other vascular implants and grafts: Secondary | ICD-10-CM

## 2020-01-01 DIAGNOSIS — Z5112 Encounter for antineoplastic immunotherapy: Secondary | ICD-10-CM

## 2020-01-01 DIAGNOSIS — C3491 Malignant neoplasm of unspecified part of right bronchus or lung: Secondary | ICD-10-CM

## 2020-01-01 DIAGNOSIS — Z5111 Encounter for antineoplastic chemotherapy: Secondary | ICD-10-CM

## 2020-01-01 LAB — CBC WITH DIFFERENTIAL (CANCER CENTER ONLY)
Abs Immature Granulocytes: 0.03 10*3/uL (ref 0.00–0.07)
Basophils Absolute: 0 10*3/uL (ref 0.0–0.1)
Basophils Relative: 0 %
Eosinophils Absolute: 0.3 10*3/uL (ref 0.0–0.5)
Eosinophils Relative: 4 %
HCT: 38.6 % — ABNORMAL LOW (ref 39.0–52.0)
Hemoglobin: 13.1 g/dL (ref 13.0–17.0)
Immature Granulocytes: 0 %
Lymphocytes Relative: 27 %
Lymphs Abs: 2 10*3/uL (ref 0.7–4.0)
MCH: 31.4 pg (ref 26.0–34.0)
MCHC: 33.9 g/dL (ref 30.0–36.0)
MCV: 92.6 fL (ref 80.0–100.0)
Monocytes Absolute: 0.7 10*3/uL (ref 0.1–1.0)
Monocytes Relative: 9 %
Neutro Abs: 4.3 10*3/uL (ref 1.7–7.7)
Neutrophils Relative %: 60 %
Platelet Count: 208 10*3/uL (ref 150–400)
RBC: 4.17 MIL/uL — ABNORMAL LOW (ref 4.22–5.81)
RDW: 13.8 % (ref 11.5–15.5)
WBC Count: 7.3 10*3/uL (ref 4.0–10.5)
nRBC: 0 % (ref 0.0–0.2)

## 2020-01-01 LAB — CMP (CANCER CENTER ONLY)
ALT: 45 U/L — ABNORMAL HIGH (ref 0–44)
AST: 16 U/L (ref 15–41)
Albumin: 3.6 g/dL (ref 3.5–5.0)
Alkaline Phosphatase: 81 U/L (ref 38–126)
Anion gap: 9 (ref 5–15)
BUN: 22 mg/dL (ref 8–23)
CO2: 27 mmol/L (ref 22–32)
Calcium: 9.5 mg/dL (ref 8.9–10.3)
Chloride: 100 mmol/L (ref 98–111)
Creatinine: 1.06 mg/dL (ref 0.61–1.24)
GFR, Est AFR Am: >60 mL/min (ref >60)
GFR, Estimated: >60 mL/min (ref >60)
Glucose, Bld: 277 mg/dL — ABNORMAL HIGH (ref 70–99)
Potassium: 3.7 mmol/L (ref 3.5–5.1)
Sodium: 136 mmol/L (ref 135–145)
Total Bilirubin: <0.2 mg/dL — ABNORMAL LOW (ref 0.3–1.2)
Total Protein: 6.7 g/dL (ref 6.5–8.1)

## 2020-01-01 LAB — TSH: TSH: 0.964 u[IU]/mL (ref 0.320–4.118)

## 2020-01-01 MED ORDER — DEXAMETHASONE SODIUM PHOSPHATE 10 MG/ML IJ SOLN
10.0000 mg | Freq: Once | INTRAMUSCULAR | Status: AC
  Administered 2020-01-01: 10 mg via INTRAVENOUS

## 2020-01-01 MED ORDER — HEPARIN SOD (PORK) LOCK FLUSH 100 UNIT/ML IV SOLN
500.0000 [IU] | Freq: Once | INTRAVENOUS | Status: AC | PRN
  Administered 2020-01-01: 17:00:00 500 [IU]
  Filled 2020-01-01: qty 5

## 2020-01-01 MED ORDER — SODIUM CHLORIDE 0.9 % IV SOLN
200.0000 mg | Freq: Once | INTRAVENOUS | Status: AC
  Administered 2020-01-01: 200 mg via INTRAVENOUS
  Filled 2020-01-01: qty 8

## 2020-01-01 MED ORDER — SODIUM CHLORIDE 0.9% FLUSH
10.0000 mL | INTRAVENOUS | Status: DC | PRN
  Administered 2020-01-01: 10 mL
  Filled 2020-01-01: qty 10

## 2020-01-01 MED ORDER — DEXAMETHASONE SODIUM PHOSPHATE 10 MG/ML IJ SOLN
INTRAMUSCULAR | Status: AC
  Filled 2020-01-01: qty 1

## 2020-01-01 MED ORDER — SODIUM CHLORIDE 0.9 % IV SOLN
150.0000 mg | Freq: Once | INTRAVENOUS | Status: AC
  Administered 2020-01-01: 150 mg via INTRAVENOUS
  Filled 2020-01-01: qty 150

## 2020-01-01 MED ORDER — SODIUM CHLORIDE 0.9 % IV SOLN: 750.0000 mg | Freq: Once | INTRAVENOUS | Status: DC

## 2020-01-01 MED ORDER — PALONOSETRON HCL INJECTION 0.25 MG/5ML
INTRAVENOUS | Status: AC
  Filled 2020-01-01: qty 5

## 2020-01-01 MED ORDER — SODIUM CHLORIDE 0.9% FLUSH
10.0000 mL | INTRAVENOUS | Status: DC | PRN
  Administered 2020-01-01: 10 mL via INTRAVENOUS
  Filled 2020-01-01: qty 10

## 2020-01-01 MED ORDER — PALONOSETRON HCL INJECTION 0.25 MG/5ML
0.2500 mg | Freq: Once | INTRAVENOUS | Status: AC
  Administered 2020-01-01: 15:00:00 0.25 mg via INTRAVENOUS

## 2020-01-01 MED ORDER — SODIUM CHLORIDE 0.9 % IV SOLN
680.0000 mg | Freq: Once | INTRAVENOUS | Status: AC
  Administered 2020-01-01: 16:00:00 680 mg via INTRAVENOUS
  Filled 2020-01-01: qty 68

## 2020-01-01 MED ORDER — SODIUM CHLORIDE 0.9 % IV SOLN
Freq: Once | INTRAVENOUS | Status: AC
  Filled 2020-01-01: qty 250

## 2020-01-01 MED ORDER — SODIUM CHLORIDE 0.9 % IV SOLN
500.0000 mg/m2 | Freq: Once | INTRAVENOUS | Status: AC
  Administered 2020-01-01: 1200 mg via INTRAVENOUS
  Filled 2020-01-01: qty 40

## 2020-01-01 NOTE — Patient Instructions (Signed)

## 2020-01-01 NOTE — Progress Notes (Signed)
SCr 1.06 today. Decrease carboplatin dose to 680 mg per Dr. Julien Nordmann.

## 2020-01-01 NOTE — Patient Instructions (Signed)
COVID-19 Vaccine Information can be found at: [email or web address] For questions related to vaccine distribution or appointments, please email [email or web address] or call [phone].   [facility] Discharge Instructions for Patients Receiving Immunotherapy and Chemotherapy  Today you received the following immunotherapy and chemotherapy agents: Pembrolizumab (Keytruda), Pemetrexed (Alimta), and Carboplatin (Paraplatin)  To help prevent nausea and vomiting after your treatment, we encourage you to take your nausea medication as directed.   If you develop nausea and vomiting that is not controlled by your nausea medication, call the clinic.   BELOW ARE SYMPTOMS THAT SHOULD BE REPORTED IMMEDIATELY:  *FEVER GREATER THAN 100.5 F  *CHILLS WITH OR WITHOUT FEVER  NAUSEA AND VOMITING THAT IS NOT CONTROLLED WITH YOUR NAUSEA MEDICATION  *UNUSUAL SHORTNESS OF BREATH  *UNUSUAL BRUISING OR BLEEDING  TENDERNESS IN MOUTH AND THROAT WITH OR WITHOUT PRESENCE OF ULCERS  *URINARY PROBLEMS  *BOWEL PROBLEMS  UNUSUAL RASH Items with * indicate a potential emergency and should be followed up as soon as possible.  Feel free to call the clinic should you have any questions or concerns. The clinic phone number is [phone].  Please show the [facility] at check-in to the Emergency Department and triage nurse.  Pembrolizumab injection What is this medicine? PEMBROLIZUMAB (pem broe liz ue mab) is a monoclonal antibody. It is used to treat certain types of cancer. This medicine may be used for other purposes; ask your health care provider or pharmacist if you have questions. COMMON BRAND NAME(S): Keytruda What should I tell my health care provider before I take this medicine? They need to know if you have any of these conditions:  diabetes  immune system problems  inflammatory bowel disease  liver  disease  lung or breathing disease  lupus  received or scheduled to receive an organ transplant or a stem-cell transplant that uses donor stem cells  an unusual or allergic reaction to pembrolizumab, other medicines, foods, dyes, or preservatives  pregnant or trying to get pregnant  breast-feeding How should I use this medicine? This medicine is for infusion into a vein. It is given by a health care professional in a hospital or clinic setting. A special MedGuide will be given to you before each treatment. Be sure to read this information carefully each time. Talk to your pediatrician regarding the use of this medicine in children. While this drug may be prescribed for children as young as 6 months for selected conditions, precautions do apply. Overdosage: If you think you have taken too much of this medicine contact a poison control center or emergency room at once. NOTE: This medicine is only for you. Do not share this medicine with others. What if I miss a dose? It is important not to miss your dose. Call your doctor or health care professional if you are unable to keep an appointment. What may interact with this medicine? Interactions have not been studied. Give your health care provider a list of all the medicines, herbs, non-prescription drugs, or dietary supplements you use. Also tell them if you smoke, drink alcohol, or use illegal drugs. Some items may interact with your medicine. This list may not describe all possible interactions. Give your health care provider a list of all the medicines, herbs, non-prescription drugs, or dietary supplements you use. Also tell them if you smoke, drink alcohol, or use illegal drugs. Some items may interact with your medicine. What should I watch for while using this medicine? Your condition will be monitored carefully while  you are receiving this medicine. You may need blood work done while you are taking this medicine. Do not become pregnant  while taking this medicine or for 4 months after stopping it. Women should inform their doctor if they wish to become pregnant or think they might be pregnant. There is a potential for serious side effects to an unborn child. Talk to your health care professional or pharmacist for more information. Do not breast-feed an infant while taking this medicine or for 4 months after the last dose. What side effects may I notice from receiving this medicine? Side effects that you should report to your doctor or health care professional as soon as possible:  allergic reactions like skin rash, itching or hives, swelling of the face, lips, or tongue  bloody or black, tarry  breathing problems  changes in vision  chest pain  chills  confusion  constipation  cough  diarrhea  dizziness or feeling faint or lightheaded  fast or irregular heartbeat  fever  flushing  joint pain  low blood counts - this medicine may decrease the number of white blood cells, red blood cells and platelets. You may be at increased risk for infections and bleeding.  muscle pain  muscle weakness  pain, tingling, numbness in the hands or feet  persistent headache  redness, blistering, peeling or loosening of the skin, including inside the mouth  signs and symptoms of high blood sugar such as dizziness; dry mouth; dry skin; fruity breath; nausea; stomach pain; increased hunger or thirst; increased urination  signs and symptoms of kidney injury like trouble passing urine or change in the amount of urine  signs and symptoms of liver injury like dark urine, light-colored stools, loss of appetite, nausea, right upper belly pain, yellowing of the eyes or skin  sweating  swollen lymph nodes  weight loss Side effects that usually do not require medical attention (report to your doctor or health care professional if they continue or are bothersome):  decreased appetite  hair loss  muscle  pain  tiredness This list may not describe all possible side effects. Call your doctor for medical advice about side effects. You may report side effects to FDA at 1-800-FDA-1088. Where should I keep my medicine? This drug is given in a hospital or clinic and will not be stored at home. NOTE: This sheet is a summary. It may not cover all possible information. If you have questions about this medicine, talk to your doctor, pharmacist, or health care provider.  2020 Elsevier/Gold Standard (2019-08-30 18:07:58)  Pemetrexed injection What is this medicine? PEMETREXED (PEM e TREX ed) is a chemotherapy drug used to treat lung cancers like non-small cell lung cancer and mesothelioma. It may also be used to treat other cancers. This medicine may be used for other purposes; ask your health care provider or pharmacist if you have questions. COMMON BRAND NAME(S): Alimta What should I tell my health care provider before I take this medicine? They need to know if you have any of these conditions:  infection (especially a virus infection such as chickenpox, cold sores, or herpes)  kidney disease  low blood counts, like low white cell, platelet, or red cell counts  lung or breathing disease, like asthma  radiation therapy  an unusual or allergic reaction to pemetrexed, other medicines, foods, dyes, or preservative  pregnant or trying to get pregnant  breast-feeding How should I use this medicine? This drug is given as an infusion into a vein. It is  administered in a hospital or clinic by a specially trained health care professional. Talk to your pediatrician regarding the use of this medicine in children. Special care may be needed. Overdosage: If you think you have taken too much of this medicine contact a poison control center or emergency room at once. NOTE: This medicine is only for you. Do not share this medicine with others. What if I miss a dose? It is important not to miss your dose.  Call your doctor or health care professional if you are unable to keep an appointment. What may interact with this medicine? This medicine may interact with the following medications:  Ibuprofen This list may not describe all possible interactions. Give your health care provider a list of all the medicines, herbs, non-prescription drugs, or dietary supplements you use. Also tell them if you smoke, drink alcohol, or use illegal drugs. Some items may interact with your medicine. What should I watch for while using this medicine? Visit your doctor for checks on your progress. This drug may make you feel generally unwell. This is not uncommon, as chemotherapy can affect healthy cells as well as cancer cells. Report any side effects. Continue your course of treatment even though you feel ill unless your doctor tells you to stop. In some cases, you may be given additional medicines to help with side effects. Follow all directions for their use. Call your doctor or health care professional for advice if you get a fever, chills or sore throat, or other symptoms of a cold or flu. Do not treat yourself. This drug decreases your body's ability to fight infections. Try to avoid being around people who are sick. This medicine may increase your risk to bruise or bleed. Call your doctor or health care professional if you notice any unusual bleeding. Be careful brushing and flossing your teeth or using a toothpick because you may get an infection or bleed more easily. If you have any dental work done, tell your dentist you are receiving this medicine. Avoid taking products that contain aspirin, acetaminophen, ibuprofen, naproxen, or ketoprofen unless instructed by your doctor. These medicines may hide a fever. Call your doctor or health care professional if you get diarrhea or mouth sores. Do not treat yourself. To protect your kidneys, drink water or other fluids as directed while you are taking this medicine. Do  not become pregnant while taking this medicine or for 6 months after stopping it. Women should inform their doctor if they wish to become pregnant or think they might be pregnant. Men should not father a child while taking this medicine and for 3 months after stopping it. This may interfere with the ability to father a child. You should talk to your doctor or health care professional if you are concerned about your fertility. There is a potential for serious side effects to an unborn child. Talk to your health care professional or pharmacist for more information. Do not breast-feed an infant while taking this medicine or for 1 week after stopping it. What side effects may I notice from receiving this medicine? Side effects that you should report to your doctor or health care professional as soon as possible:  allergic reactions like skin rash, itching or hives, swelling of the face, lips, or tongue  breathing problems  redness, blistering, peeling or loosening of the skin, including inside the mouth  signs and symptoms of bleeding such as bloody or black, tarry stools; red or dark-brown urine; spitting up blood or brown  material that looks like coffee grounds; red spots on the skin; unusual bruising or bleeding from the eye, gums, or nose  signs and symptoms of infection like fever or chills; cough; sore throat; pain or trouble passing urine  signs and symptoms of kidney injury like trouble passing urine or change in the amount of urine  signs and symptoms of liver injury like dark yellow or brown urine; general ill feeling or flu-like symptoms; light-colored stools; loss of appetite; nausea; right upper belly pain; unusually weak or tired; yellowing of the eyes or skin Side effects that usually do not require medical attention (report to your doctor or health care professional if they continue or are bothersome):  constipation  mouth sores  nausea, vomiting  unusually weak or tired This  list may not describe all possible side effects. Call your doctor for medical advice about side effects. You may report side effects to FDA at 1-800-FDA-1088. Where should I keep my medicine? This drug is given in a hospital or clinic and will not be stored at home. NOTE: This sheet is a summary. It may not cover all possible information. If you have questions about this medicine, talk to your doctor, pharmacist, or health care provider.  2020 Elsevier/Gold Standard (2017-12-13 16:11:33)  Carboplatin injection What is this medicine? CARBOPLATIN (KAR boe pla tin) is a chemotherapy drug. It targets fast dividing cells, like cancer cells, and causes these cells to die. This medicine is used to treat ovarian cancer and many other cancers. This medicine may be used for other purposes; ask your health care provider or pharmacist if you have questions. COMMON BRAND NAME(S): Paraplatin What should I tell my health care provider before I take this medicine? They need to know if you have any of these conditions:  blood disorders  hearing problems  kidney disease  recent or ongoing radiation therapy  an unusual or allergic reaction to carboplatin, cisplatin, other chemotherapy, other medicines, foods, dyes, or preservatives  pregnant or trying to get pregnant  breast-feeding How should I use this medicine? This drug is usually given as an infusion into a vein. It is administered in a hospital or clinic by a specially trained health care professional. Talk to your pediatrician regarding the use of this medicine in children. Special care may be needed. Overdosage: If you think you have taken too much of this medicine contact a poison control center or emergency room at once. NOTE: This medicine is only for you. Do not share this medicine with others. What if I miss a dose? It is important not to miss a dose. Call your doctor or health care professional if you are unable to keep an  appointment. What may interact with this medicine?  medicines for seizures  medicines to increase blood counts like filgrastim, pegfilgrastim, sargramostim  some antibiotics like amikacin, gentamicin, neomycin, streptomycin, tobramycin  vaccines Talk to your doctor or health care professional before taking any of these medicines:  acetaminophen  aspirin  ibuprofen  ketoprofen  naproxen This list may not describe all possible interactions. Give your health care provider a list of all the medicines, herbs, non-prescription drugs, or dietary supplements you use. Also tell them if you smoke, drink alcohol, or use illegal drugs. Some items may interact with your medicine. What should I watch for while using this medicine? Your condition will be monitored carefully while you are receiving this medicine. You will need important blood work done while you are taking this medicine. This drug may  make you feel generally unwell. This is not uncommon, as chemotherapy can affect healthy cells as well as cancer cells. Report any side effects. Continue your course of treatment even though you feel ill unless your doctor tells you to stop. In some cases, you may be given additional medicines to help with side effects. Follow all directions for their use. Call your doctor or health care professional for advice if you get a fever, chills or sore throat, or other symptoms of a cold or flu. Do not treat yourself. This drug decreases your body's ability to fight infections. Try to avoid being around people who are sick. This medicine may increase your risk to bruise or bleed. Call your doctor or health care professional if you notice any unusual bleeding. Be careful brushing and flossing your teeth or using a toothpick because you may get an infection or bleed more easily. If you have any dental work done, tell your dentist you are receiving this medicine. Avoid taking products that contain aspirin,  acetaminophen, ibuprofen, naproxen, or ketoprofen unless instructed by your doctor. These medicines may hide a fever. Do not become pregnant while taking this medicine. Women should inform their doctor if they wish to become pregnant or think they might be pregnant. There is a potential for serious side effects to an unborn child. Talk to your health care professional or pharmacist for more information. Do not breast-feed an infant while taking this medicine. What side effects may I notice from receiving this medicine? Side effects that you should report to your doctor or health care professional as soon as possible:  allergic reactions like skin rash, itching or hives, swelling of the face, lips, or tongue  signs of infection - fever or chills, cough, sore throat, pain or difficulty passing urine  signs of decreased platelets or bleeding - bruising, pinpoint red spots on the skin, black, tarry stools, nosebleeds  signs of decreased red blood cells - unusually weak or tired, fainting spells, lightheadedness  breathing problems  changes in hearing  changes in vision  chest pain  high blood pressure  low blood counts - This drug may decrease the number of white blood cells, red blood cells and platelets. You may be at increased risk for infections and bleeding.  nausea and vomiting  pain, swelling, redness or irritation at the injection site  pain, tingling, numbness in the hands or feet  problems with balance, talking, walking  trouble passing urine or change in the amount of urine Side effects that usually do not require medical attention (report to your doctor or health care professional if they continue or are bothersome):  hair loss  loss of appetite  metallic taste in the mouth or changes in taste This list may not describe all possible side effects. Call your doctor for medical advice about side effects. You may report side effects to FDA at 1-800-FDA-1088. Where should  I keep my medicine? This drug is given in a hospital or clinic and will not be stored at home. NOTE: This sheet is a summary. It may not cover all possible information. If you have questions about this medicine, talk to your doctor, pharmacist, or health care provider.  2020 Elsevier/Gold Standard (2008-01-29 14:38:05)  Coronavirus (COVID-19) Are you at risk?  Are you at risk for the Coronavirus (COVID-19)?  To be considered HIGH RISK for Coronavirus (COVID-19), you have to meet the following criteria:  . Traveled to [place], [place], [place], [place] or [place]; or in the  [place] to [place], [place], [place], or [place]; and have fever, cough, and shortness of breath within the last 2 weeks of travel OR . Been in close contact with a person diagnosed with COVID-19 within the last 2 weeks and have fever, cough, and shortness of breath . IF YOU DO NOT MEET THESE CRITERIA, YOU ARE CONSIDERED LOW RISK FOR COVID-19.  What to do if you are HIGH RISK for COVID-19?  Marland Kitchen If you are having a medical emergency, call 911. . Seek medical care right away. Before you go to a doctor's office, urgent care or emergency department, call ahead and tell them about your recent travel, contact with someone diagnosed with COVID-19, and your symptoms. You should receive instructions from your physician's office regarding next steps of care.  . When you arrive at healthcare provider, tell the healthcare staff immediately you have returned from visiting [place], [place], [place], [place] or [place]; or traveled in the [place] to [place], [employer], [place], or [place]; in the last two weeks or you have been in close contact with a person diagnosed with COVID-19 in the last 2 weeks.   . Tell the health care staff about your symptoms: fever, cough and shortness of breath. . After you have been seen by a medical provider, you will be either: o Tested for (COVID-19) and discharged home on quarantine  except to seek medical care if symptoms worsen, and asked to  - Stay home and avoid contact with others until you get your results (4-5 days)  - Avoid travel on public transportation if possible (such as bus, train, or airplane) or o Sent to the Emergency Department by EMS for evaluation, COVID-19 testing, and possible admission depending on your condition and test results.  What to do if you are LOW RISK for COVID-19?  Reduce your risk of any infection by using the same precautions used for avoiding the common cold or flu:  Marland Kitchen Wash your hands often with soap and warm water for at least 20 seconds.  If soap and water are not readily available, use an alcohol-based hand sanitizer with at least 60% alcohol.  . If coughing or sneezing, cover your mouth and nose by coughing or sneezing into the elbow areas of your shirt or coat, into a tissue or into your sleeve (not your hands). . Avoid shaking hands with others and consider head nods or verbal greetings only. . Avoid touching your eyes, nose, or mouth with unwashed hands.  . Avoid close contact with people who are sick. . Avoid places or events with large numbers of people in one location, like concerts or sporting events. . Carefully consider travel plans you have or are making. . If you are planning any travel outside or inside the [place], visit the CDC's Travelers' Health webpage for the latest health notices. . If you have some symptoms but not all symptoms, continue to monitor at home and seek medical attention if your symptoms worsen. . If you are having a medical emergency, call 911.   [place] / e-Visit: [email or web address]         [facility]: [facility]: [phone]                   [facility]: [phone]

## 2020-01-02 ENCOUNTER — Ambulatory Visit (INDEPENDENT_AMBULATORY_CARE_PROVIDER_SITE_OTHER): Payer: No Typology Code available for payment source | Admitting: Pulmonary Disease

## 2020-01-02 ENCOUNTER — Telehealth: Payer: Self-pay | Admitting: *Deleted

## 2020-01-02 ENCOUNTER — Encounter: Payer: Self-pay | Admitting: Pulmonary Disease

## 2020-01-02 VITALS — BP 134/70 | HR 87 | Ht 69.0 in | Wt 243.8 lb

## 2020-01-02 DIAGNOSIS — Z720 Tobacco use: Secondary | ICD-10-CM | POA: Diagnosis not present

## 2020-01-02 DIAGNOSIS — R0600 Dyspnea, unspecified: Secondary | ICD-10-CM | POA: Diagnosis not present

## 2020-01-02 DIAGNOSIS — Z6836 Body mass index (BMI) 36.0-36.9, adult: Secondary | ICD-10-CM

## 2020-01-02 DIAGNOSIS — R0609 Other forms of dyspnea: Secondary | ICD-10-CM

## 2020-01-02 DIAGNOSIS — E6609 Other obesity due to excess calories: Secondary | ICD-10-CM | POA: Diagnosis not present

## 2020-01-02 DIAGNOSIS — C3491 Malignant neoplasm of unspecified part of right bronchus or lung: Secondary | ICD-10-CM

## 2020-01-02 MED ORDER — ALBUTEROL SULFATE HFA 108 (90 BASE) MCG/ACT IN AERS: 2.0000 | INHALATION_SPRAY | RESPIRATORY_TRACT | 3 refills | Status: DC | PRN

## 2020-01-02 MED ORDER — ANORO ELLIPTA 62.5-25 MCG/INH IN AEPB: 1.0000 | INHALATION_SPRAY | Freq: Every day | RESPIRATORY_TRACT | 0 refills | Status: DC

## 2020-01-02 MED ORDER — ANORO ELLIPTA 62.5-25 MCG/INH IN AEPB: 1.0000 | INHALATION_SPRAY | Freq: Every day | RESPIRATORY_TRACT | 11 refills | Status: DC

## 2020-01-02 NOTE — Progress Notes (Signed)
Patient seen in the office today and instructed on use of anoro.  Patient expressed understanding and demonstrated technique.

## 2020-01-02 NOTE — Progress Notes (Signed)
Synopsis: Referred in January 2021 for abnormal CT imaging by [name], MD  Subjective:   PATIENT ID: Kevin Lambert. GENDER: male DOB: October 03, 1956, MRN: [id number]  Chief Complaint  Patient presents with  . Follow-up    Pt states he is still experiencing constipation and also states he has been having anxiety attacks which affects his breathing.    This is a 64 year old gentleman referred to [place] from Dr. Lew Dawes office.  Patient with a past medical history of diabetes, hypertension, COVID-19 infection in December 2020.  He has a longstanding history of smoking.  Patient had ongoing complaints of cough for greater than 4 months.  Additionally having hemoptysis.  Patient had chest imaging followed by x-ray then CT imaging of the chest on October 21, 2019 which showed a ill-defined consolidative mass within the right upper lobe that extended into the superior segment of the right lower lobe and associated mediastinal and hilar adenopathy with scattered bilateral pulmonary nodules this was concerning for an advanced stage bronchogenic carcinoma.  Patient was referred to oncology for evaluation from the [facility] system.  Dr. Earlie Server has referred the patient to see me to establish care and obtain tissue diagnosis for potential underlying malignancy.  OV 12/03/2018: Today, patient seen today in the office.  No respiratory complaints.  No chest pain.  Denies hemoptysis at this time.  Very anxious about his current state.  Unfortunately still smoking half a pack per day.  No family history of lung cancer.  Patient states that he is ready to get this process started would like to have biopsy as soon as possible.  OV 01/02/2020: Here today for follow after bronchoscopy.  Overall from a breathing standpoint still has significant dyspnea on exertion.  Likely has COPD.  No prior PFTs.  He quit smoking last week fortunately.  He is accompanied today in the office by his sister.  Patient denies  hemoptysis.  He does complain of a rash these had on his lower extremities.  Has follow-up plan with primary care.  He does get most of his medications at the [facility].  He did well and tolerated his first session of chemotherapy this week.   Past Medical History:  Diagnosis Date  . Arthritis   . Depression   . DM (diabetes mellitus) ([place])    type II  . Dyslipidemia   . Dyspnea    unable to walk much - he thinks it is from   . GERD (gastroesophageal reflux disease)   . Hepatitis    Hepatitis C- treated  . History of kidney stones   . HTN (hypertension)   . Neuropathy   . OSA on CPAP   . PTSD (post-traumatic stress disorder)      Family History  Problem Relation Age of Onset  . Liver disease Mother   . Diabetes Father      Past Surgical History:  Procedure Laterality Date  . BIOPSY OF MEDIASTINAL MASS  12/10/2019   Procedure: BIOPSY OF MEDIASTINAL MASS;  Surgeon: Garner Nash, DO;  Location: [facility];  Service: Pulmonary;;  right upper lobe  . BRONCHIAL NEEDLE ASPIRATION BIOPSY  12/10/2019   Procedure: BRONCHIAL NEEDLE ASPIRATION BIOPSIES;  Surgeon: Garner Nash, DO;  Location: [facility];  Service: Pulmonary;;  . COLONOSCOPY    . IR IMAGING GUIDED PORT INSERTION  12/31/2019  . kidney stone    . URETERAL STENT PLACEMENT    . Ureteral Stent Removed    . VIDEO BRONCHOSCOPY  WITH ENDOBRONCHIAL ULTRASOUND N/A 12/10/2019   Procedure: VIDEO BRONCHOSCOPY WITH ENDOBRONCHIAL ULTRASOUND;  Surgeon: Garner Nash, DO;  Location: [facility];  Service: Pulmonary;  Laterality: N/A;    Social History   Socioeconomic History  . Marital status: Single    Spouse name: Not on file  . Number of children: Not on file  . Years of education: Not on file  . Highest education level: Not on file  Occupational History  . Not on file  Tobacco Use  . Smoking status: Current Every Day Smoker    Packs/day: 0.60    Years: 50.00    Pack years: 30.00    Types: Cigarettes  .  Smokeless tobacco: Never Used  Substance and Sexual Activity  . Alcohol use: Yes    Alcohol/week: 6.0 standard drinks    Types: 6 Cans of beer per week  . Drug use: Not Currently  . Sexual activity: Not on file  Other Topics Concern  . Not on file  Social History Narrative  . Not on file   Social Determinants of Health   Financial Resource Strain:   . Difficulty of Paying Living Expenses: Not on file  Food Insecurity:   . Worried About Charity fundraiser in the Last Year: Not on file  . Ran Out of Food in the Last Year: Not on file  Transportation Needs:   . Lack of Transportation (Medical): Not on file  . Lack of Transportation (Non-Medical): Not on file  Physical Activity:   . Days of Exercise per Week: Not on file  . Minutes of Exercise per Session: Not on file  Stress:   . Feeling of Stress : Not on file  Social Connections:   . Frequency of Communication with Friends and Family: Not on file  . Frequency of Social Gatherings with Friends and Family: Not on file  . Attends Religious Services: Not on file  . Active Member of Clubs or Organizations: Not on file  . Attends Archivist Meetings: Not on file  . Marital Status: Not on file  Intimate Partner Violence:   . Fear of Current or Ex-Partner: Not on file  . Emotionally Abused: Not on file  . Physically Abused: Not on file  . Sexually Abused: Not on file     Allergies  Allergen Reactions  . Metformin And Related Other (See Comments)    "severe Muscle spasms"     Outpatient Medications Prior to Visit  Medication Sig Dispense Refill  . amLODipine (NORVASC) 10 MG tablet Take 10 mg by mouth daily.    Marland Kitchen aspirin EC 81 MG tablet Take 81 mg by mouth daily.    Marland Kitchen atorvastatin (LIPITOR) 10 MG tablet Take 10 mg by mouth daily.    . calcium carbonate (TUMS - DOSED IN MG ELEMENTAL CALCIUM) 500 MG chewable tablet Chew 2 tablets by mouth daily as needed for indigestion or heartburn.    . folic acid (FOLVITE) 1 MG  tablet Take 1 tablet (1 mg total) by mouth daily. Pt needs to pick up today and start ASAP . He will pay out of pocket. He is waiting on the [place] to send his folic acid which will be next week. 7 tablet 0  . glipiZIDE (GLUCOTROL) 5 MG tablet Take 5 mg by mouth daily before breakfast.     . lidocaine-prilocaine (EMLA) cream Apply to Port-A-Cath site 30-60-minute before treatment. 30 g 0  . Menthol ([employer] LOZENGES MT) Use as directed 1 lozenge  in the mouth or throat daily as needed (throat).     . sildenafil (VIAGRA) 100 MG tablet Take 100 mg by mouth daily as needed for erectile dysfunction.    . triamterene-hydrochlorothiazide (MAXZIDE-25) 37.5-25 MG tablet Take 1 tablet by mouth daily as needed.    . dicyclomine (BENTYL) 20 MG tablet Take 1 tablet (20 mg total) by mouth 2 (two) times daily. 20 tablet 0  . folic acid (FOLVITE) 1 MG tablet Take 1 tablet (1 mg total) by mouth daily. 30 tablet 4  . prochlorperazine (COMPAZINE) 10 MG tablet Take 1 tablet (10 mg total) by mouth every 6 (six) hours as needed for nausea or vomiting. 30 tablet 0   No facility-administered medications prior to visit.    Review of Systems  Constitutional: Negative for chills, fever, malaise/fatigue and weight loss.  HENT: Negative for hearing loss, sore throat and tinnitus.   Eyes: Negative for blurred vision and double vision.  Respiratory: Positive for cough and shortness of breath. Negative for hemoptysis, sputum production, wheezing and stridor.   Cardiovascular: Negative for chest pain, palpitations, orthopnea, leg swelling and PND.  Gastrointestinal: Negative for abdominal pain, constipation, diarrhea, heartburn, nausea and vomiting.  Genitourinary: Negative for dysuria, hematuria and urgency.  Musculoskeletal: Negative for joint pain and myalgias.  Skin: Negative for itching and rash.  Neurological: Negative for dizziness, tingling, weakness and headaches.  Endo/Heme/Allergies: Negative for environmental allergies.  Does not bruise/bleed easily.  Psychiatric/Behavioral: Negative for depression. The patient is not nervous/anxious and does not have insomnia.   All other systems reviewed and are negative.    Objective:  Physical Exam Vitals reviewed.  Constitutional:      General: He is not in acute distress.    Appearance: He is well-developed. He is obese.  HENT:     Head: Normocephalic and atraumatic.  Eyes:     General: No scleral icterus.    Conjunctiva/sclera: Conjunctivae normal.     Pupils: Pupils are equal, round, and reactive to light.  Neck:     Vascular: No JVD.     Trachea: No tracheal deviation.  Cardiovascular:     Rate and Rhythm: Normal rate and regular rhythm.     Heart sounds: Normal heart sounds. No murmur.  Pulmonary:     Effort: Pulmonary effort is normal. No tachypnea, accessory muscle usage or respiratory distress.     Breath sounds: Normal breath sounds. No stridor. No wheezing, rhonchi or rales.  Abdominal:     Palpations: Abdomen is soft.     Comments: Obese pannus  Musculoskeletal:        General: No tenderness.     Cervical back: Neck supple.     Right lower leg: Edema present.     Left lower leg: Edema present.  Lymphadenopathy:     Cervical: No cervical adenopathy.  Skin:    General: Skin is warm and dry.     Capillary Refill: Capillary refill takes less than 2 seconds.     Findings: No rash.  Neurological:     Mental Status: He is alert and oriented to person, place, and time.  Psychiatric:        Behavior: Behavior normal.      Vitals:   01/02/20 0926  BP: 134/70  Pulse: 87  SpO2: 94%  Weight: 243 lb 12.8 oz (110.6 kg)  Height: 5\' 9"  (1.753 m)   94% on RA BMI Readings from Last 3 Encounters:  01/02/20 36.00 kg/m  01/01/20 36.07 kg/m  12/25/19 36.51 kg/m   Wt Readings from Last 3 Encounters:  01/02/20 243 lb 12.8 oz (110.6 kg)  01/01/20 244 lb 4 oz (110.8 kg)  12/25/19 247 lb 3.2 oz (112.1 kg)    Chest Imaging: October 21, 2019: Chest CT, right upper lobe mass, mediastinal hilar adenopathy. The patient's images have been independently reviewed by me.    Pulmonary Functions Testing Results: No flowsheet data found.     Assessment & Plan:     ICD-10-CM   1. Stage 4 lung cancer, right (HCC)  C34.91   2. DOE (dyspnea on exertion)  R06.00 Pulmonary Function Test  3. Non-small cell cancer of right lung (HCC)  C34.91   4. Tobacco abuse  Z72.0   5. Class 2 obesity due to excess calories with body mass index (BMI) of 36.0 to 36.9 in adult, unspecified whether serious comorbidity present  E66.09    Z68.36     Assessment:   New diagnosis of stage IV (T2b, N3, M1 a) non-small cell lung cancer consistent with adenocarcinoma, large right upper lobe mass with associated mediastinal and supraclavicular adenopathy.  Patient started on carboplatinum plus Alimta plus Keytruda December 31, 2019.  Patient with longstanding history of tobacco abuse.  Likely has underlying COPD no prior PFTs.  Tobacco abuse.  Quit smoking last week.  Plan Following Extensive Data Review & Interpretation:  . I reviewed prior external note(s) from Dr. Julien Nordmann, 12/24/2018 oncology office visit  . I reviewed the result(s) of bronchoscopy pathology consistent with NSCLC.  . I have ordered PFTs.  New orders for Anoro Ellipta.  Patient instructed on inhaler use today in the office.  New albuterol inhaler prescription.  To be used as needed shortness of breath and wheezing every 6 hours.  Patient counseled on tobacco abuse cessation.  Independent interpretation of tests . Review of patient's NM PET 12/09/2019 images revealed pet avid RLL lung mass and mediastinal/hilar adenopathy. The patient's images have been independently reviewed by me.    Discussion of pathology with Dr. Sable Feil and plan with Dr. Julien Nordmann from thoracic oncology.     Garner Nash, DO [facility] 01/02/2020 9:46 AM

## 2020-01-02 NOTE — Patient Instructions (Addendum)
Thank you for visiting Dr. Valeta Harms at [facility]. Today we recommend the following:  Orders Placed This Encounter  Procedures  . Pulmonary Function Test   Anoro samples and new script to [facility] pharmacy  Albuterol inhaler prescription  Return in about 4 months (around 05/01/2020) for with APP or Dr. Valeta Harms.    Please do your part to reduce the spread of COVID-19.

## 2020-01-02 NOTE — Addendum Note (Signed)
Addended by: Lorretta Harp on: 01/02/2020 12:01 PM   Modules accepted: Orders

## 2020-01-08 ENCOUNTER — Other Ambulatory Visit: Payer: Self-pay

## 2020-01-08 ENCOUNTER — Inpatient Hospital Stay: Payer: No Typology Code available for payment source

## 2020-01-08 ENCOUNTER — Encounter: Payer: Self-pay | Admitting: Physician Assistant

## 2020-01-08 ENCOUNTER — Inpatient Hospital Stay: Payer: No Typology Code available for payment source | Attending: Internal Medicine | Admitting: Physician Assistant

## 2020-01-08 VITALS — BP 149/61 | HR 98 | Temp 98.0°F | Resp 19 | Ht 69.0 in | Wt 244.0 lb

## 2020-01-08 DIAGNOSIS — C3411 Malignant neoplasm of upper lobe, right bronchus or lung: Secondary | ICD-10-CM | POA: Insufficient documentation

## 2020-01-08 DIAGNOSIS — C3491 Malignant neoplasm of unspecified part of right bronchus or lung: Secondary | ICD-10-CM

## 2020-01-08 DIAGNOSIS — Z5111 Encounter for antineoplastic chemotherapy: Secondary | ICD-10-CM | POA: Insufficient documentation

## 2020-01-08 DIAGNOSIS — E860 Dehydration: Secondary | ICD-10-CM | POA: Insufficient documentation

## 2020-01-08 DIAGNOSIS — K5900 Constipation, unspecified: Secondary | ICD-10-CM | POA: Insufficient documentation

## 2020-01-08 DIAGNOSIS — Z5112 Encounter for antineoplastic immunotherapy: Secondary | ICD-10-CM | POA: Diagnosis present

## 2020-01-08 DIAGNOSIS — Z79899 Other long term (current) drug therapy: Secondary | ICD-10-CM | POA: Diagnosis not present

## 2020-01-08 DIAGNOSIS — E119 Type 2 diabetes mellitus without complications: Secondary | ICD-10-CM | POA: Diagnosis not present

## 2020-01-08 DIAGNOSIS — Z452 Encounter for adjustment and management of vascular access device: Secondary | ICD-10-CM | POA: Insufficient documentation

## 2020-01-08 DIAGNOSIS — I10 Essential (primary) hypertension: Secondary | ICD-10-CM | POA: Diagnosis not present

## 2020-01-08 LAB — CBC WITH DIFFERENTIAL (CANCER CENTER ONLY)
Abs Immature Granulocytes: 0.01 10*3/uL (ref 0.00–0.07)
Basophils Absolute: 0 10*3/uL (ref 0.0–0.1)
Basophils Relative: 0 %
Eosinophils Absolute: 0.2 10*3/uL (ref 0.0–0.5)
Eosinophils Relative: 5 %
HCT: 37.8 % — ABNORMAL LOW (ref 39.0–52.0)
Hemoglobin: 13.2 g/dL (ref 13.0–17.0)
Immature Granulocytes: 0 %
Lymphocytes Relative: 41 %
Lymphs Abs: 1.8 10*3/uL (ref 0.7–4.0)
MCH: 31.7 pg (ref 26.0–34.0)
MCHC: 34.9 g/dL (ref 30.0–36.0)
MCV: 90.9 fL (ref 80.0–100.0)
Monocytes Absolute: 0.3 10*3/uL (ref 0.1–1.0)
Monocytes Relative: 6 %
Neutro Abs: 2.1 10*3/uL (ref 1.7–7.7)
Neutrophils Relative %: 48 %
Platelet Count: 176 10*3/uL (ref 150–400)
RBC: 4.16 MIL/uL — ABNORMAL LOW (ref 4.22–5.81)
RDW: 13.3 % (ref 11.5–15.5)
WBC Count: 4.4 10*3/uL (ref 4.0–10.5)
nRBC: 0 % (ref 0.0–0.2)

## 2020-01-08 LAB — TSH: TSH: 1.046 u[IU]/mL (ref 0.320–4.118)

## 2020-01-08 LAB — CMP (CANCER CENTER ONLY)
ALT: 69 U/L — ABNORMAL HIGH (ref 0–44)
AST: 29 U/L (ref 15–41)
Albumin: 3.6 g/dL (ref 3.5–5.0)
Alkaline Phosphatase: 77 U/L (ref 38–126)
Anion gap: 8 (ref 5–15)
BUN: 17 mg/dL (ref 8–23)
CO2: 28 mmol/L (ref 22–32)
Calcium: 10.2 mg/dL (ref 8.9–10.3)
Chloride: 97 mmol/L — ABNORMAL LOW (ref 98–111)
Creatinine: 0.84 mg/dL (ref 0.61–1.24)
GFR, Est AFR Am: >60 mL/min (ref >60)
GFR, Estimated: >60 mL/min (ref >60)
Glucose, Bld: 259 mg/dL — ABNORMAL HIGH (ref 70–99)
Potassium: 4.3 mmol/L (ref 3.5–5.1)
Sodium: 133 mmol/L — ABNORMAL LOW (ref 135–145)
Total Bilirubin: 0.2 mg/dL — ABNORMAL LOW (ref 0.3–1.2)
Total Protein: 6.6 g/dL (ref 6.5–8.1)

## 2020-01-08 NOTE — Progress Notes (Signed)
[place] OFFICE PROGRESS NOTE  Kevin Lambert, [place]  [place] [id number]  DIAGNOSIS: Stage IV (T2b, N3, M1a) non-small cell lung cancer, adenocarcinoma presented with large right upper lobe lung mass in addition to mediastinal and right supraclavicular lymphadenopathy as well as bilateral pulmonary nodules diagnosed in February 2021.  MOLECULAR STUDY by [employer]:  KRASG12C, 4.3%, Binimetinib  [id number], 0.9%, Niraparib, Olaparib, Rucaparib,Talazoparib, Tazemetostat  [id number], 1.7%, None          PRIOR THERAPY: None   CURRENT THERAPY: CURRENT THERAPY: Systemic chemotherapy with carboplatin for AUC of 5, Alimta 500 mg/M2 and Keytruda 200 mg IV every 3 weeks.  First dose December 31, 2019. Status post 1 cycle  INTERVAL HISTORY: Kevin Lambert. 64 y.o. male returns to the clinic today for follow-up visit.  The patient completed his first cycle of systemic palliative chemotherapy with carboplatin, Alimta, and Keytruda 1 week ago and he tolerated it well without any concerning complaints. He mentions that he has hip pain. Of note, he does not have any metastatic disease to the bones. He saw his PCP last week who gave him a steroid injection in his right hip. He also uses tylenol for pain. He states it is challenging to ambulate at his job secondary to his pain and dyspnea on exertion. He denies any recent fever, chills, night sweats, or weight loss.  He is reporting his baseline shortness of breath with exertion without cough or hemoptysis.  He recently had a visit with Dr. Valeta Harms from pulmonology who ordered PFTs on this patient and prescribed a new inhaler. The patient is in the process of trying to quit smoking.  The patient denies any nausea, vomiting, or diarrhea. He reports frequent constipation for which he takes docusate.  He denies any headaches. He denies any rashes or skin changes.  The patient is here today for evaluation and a 1 week follow-up  visit after completing his first cycle of chemotherapy.  MEDICAL HISTORY: Past Medical History:  Diagnosis Date  . Arthritis   . Depression   . DM (diabetes mellitus) ([place])    type II  . Dyslipidemia   . Dyspnea    unable to walk much - he thinks it is from   . GERD (gastroesophageal reflux disease)   . Hepatitis    Hepatitis C- treated  . History of kidney stones   . HTN (hypertension)   . Neuropathy   . OSA on CPAP   . PTSD (post-traumatic stress disorder)     ALLERGIES:  is allergic to metformin and related.  MEDICATIONS:  Current Outpatient Medications  Medication Sig Dispense Refill  . albuterol (VENTOLIN HFA) 108 (90 Base) MCG/ACT inhaler Inhale 2 puffs into the lungs every 4 (four) hours as needed for wheezing or shortness of breath. 18 g 3  . amLODipine (NORVASC) 10 MG tablet Take 10 mg by mouth daily.    Marland Kitchen aspirin EC 81 MG tablet Take 81 mg by mouth daily.    Marland Kitchen atorvastatin (LIPITOR) 10 MG tablet Take 10 mg by mouth daily.    . calcium carbonate (TUMS - DOSED IN MG ELEMENTAL CALCIUM) 500 MG chewable tablet Chew 2 tablets by mouth daily as needed for indigestion or heartburn.    . docusate sodium (COLACE) 50 MG capsule Take 50 mg by mouth as needed for mild constipation.    . folic acid (FOLVITE) 1 MG tablet Take 1 tablet (1 mg total) by mouth daily. Pt needs to  pick up today and start ASAP . He will pay out of pocket. He is waiting on the [place] to send his folic acid which will be next week. 7 tablet 0  . glipiZIDE (GLUCOTROL) 5 MG tablet Take 5 mg by mouth daily before breakfast.     . lidocaine-prilocaine (EMLA) cream Apply to Port-A-Cath site 30-60-minute before treatment. 30 g 0  . Menthol ([employer] LOZENGES MT) Use as directed 1 lozenge in the mouth or throat daily as needed (throat).     . sildenafil (VIAGRA) 100 MG tablet Take 100 mg by mouth daily as needed for erectile dysfunction.    . triamterene-hydrochlorothiazide (MAXZIDE-25) 37.5-25 MG tablet Take 1 tablet by  mouth daily as needed.    . umeclidinium-vilanterol (ANORO ELLIPTA) 62.5-25 MCG/INH AEPB Inhale 1 puff into the lungs daily. 60 each 11  . umeclidinium-vilanterol (ANORO ELLIPTA) 62.5-25 MCG/INH AEPB Inhale 1 puff into the lungs daily. 7 each 0   No current facility-administered medications for this visit.    SURGICAL HISTORY:  Past Surgical History:  Procedure Laterality Date  . BIOPSY OF MEDIASTINAL MASS  12/10/2019   Procedure: BIOPSY OF MEDIASTINAL MASS;  Surgeon: Garner Nash, DO;  Location: [facility];  Service: Pulmonary;;  right upper lobe  . BRONCHIAL NEEDLE ASPIRATION BIOPSY  12/10/2019   Procedure: BRONCHIAL NEEDLE ASPIRATION BIOPSIES;  Surgeon: Garner Nash, DO;  Location: [facility];  Service: Pulmonary;;  . COLONOSCOPY    . IR IMAGING GUIDED PORT INSERTION  12/31/2019  . kidney stone    . URETERAL STENT PLACEMENT    . Ureteral Stent Removed    . VIDEO BRONCHOSCOPY WITH ENDOBRONCHIAL ULTRASOUND N/A 12/10/2019   Procedure: VIDEO BRONCHOSCOPY WITH ENDOBRONCHIAL ULTRASOUND;  Surgeon: Garner Nash, DO;  Location: [facility];  Service: Pulmonary;  Laterality: N/A;    REVIEW OF SYSTEMS:   Review of Systems  Constitutional: Negative for appetite change, chills, fatigue, fever and unexpected weight change.  HENT: Negative for mouth sores, nosebleeds, sore throat and trouble swallowing.   Eyes: Negative for eye problems and icterus.  Respiratory: Positive for dyspnea on exertion. Negative for cough, hemoptysis,  and wheezing.   Cardiovascular: Negative for chest pain and leg swelling.  Gastrointestinal: Positive for constipation. Negative for abdominal pain,  diarrhea, nausea and vomiting.  Genitourinary: Negative for bladder incontinence, difficulty urinating, dysuria, frequency and hematuria.   Musculoskeletal: Positive for bilateral hip and knee pain (R>L). Negative for back pain, gait problem, neck pain and neck stiffness.  Skin: Negative for itching and rash.   Neurological: Negative for dizziness, extremity weakness, gait problem, headaches, light-headedness and seizures.  Hematological: Negative for adenopathy. Does not bruise/bleed easily.  Psychiatric/Behavioral: Negative for confusion, depression and sleep disturbance. The patient is not nervous/anxious.     PHYSICAL EXAMINATION:  Blood pressure (!) 149/61, pulse 98, temperature 98 F (36.7 C), temperature source Oral, resp. rate 19, height 5\' 9"  (1.753 m), weight 244 lb (110.7 kg), SpO2 98 %.  ECOG PERFORMANCE STATUS: 1 - Symptomatic but completely ambulatory  Physical Exam  Constitutional: Oriented to person, place, and time and well-developed, well-nourished, and in no distress.  HENT:  Head: Normocephalic and atraumatic.  Mouth/Throat: Oropharynx is clear and moist. No oropharyngeal exudate.  Eyes: Conjunctivae are normal. Right eye exhibits no discharge. Left eye exhibits no discharge. No scleral icterus.  Neck: Normal range of motion. Neck supple.  Cardiovascular: Normal rate, regular rhythm, normal heart sounds and intact distal pulses.   Pulmonary/Chest: Effort normal and breath  sounds normal. No respiratory distress. No wheezes. No rales.  Abdominal: Soft. Bowel sounds are normal. Exhibits no distension and no mass. There is no tenderness.  Musculoskeletal: Normal range of motion. Exhibits no edema.  Lymphadenopathy:    No cervical adenopathy.  Neurological: Alert and oriented to person, place, and time. Exhibits normal muscle tone. Gait normal. Coordination normal.  Skin: Skin is warm and dry. No rash noted. Not diaphoretic. No erythema. No pallor.  Psychiatric: Mood, memory and judgment normal.  Vitals reviewed.  LABORATORY DATA: Lab Results  Component Value Date   WBC 7.3 01/01/2020   HGB 13.1 01/01/2020   HCT 38.6 (L) 01/01/2020   MCV 92.6 01/01/2020   PLT 208 01/01/2020      Chemistry      Component Value Date/Time   NA 136 01/01/2020 1350   K 3.7 01/01/2020  1350   CL 100 01/01/2020 1350   CO2 27 01/01/2020 1350   BUN 22 01/01/2020 1350   CREATININE 1.06 01/01/2020 1350      Component Value Date/Time   CALCIUM 9.5 01/01/2020 1350   ALKPHOS 81 01/01/2020 1350   AST 16 01/01/2020 1350   ALT 45 (H) 01/01/2020 1350   BILITOT <0.2 (L) 01/01/2020 1350       RADIOGRAPHIC STUDIES:  MR BRAIN W WO CONTRAST  Result Date: 12/11/2019 CLINICAL DATA:  Non-small cell lung cancer, staging. EXAM: MRI HEAD WITHOUT AND WITH CONTRAST TECHNIQUE: Multiplanar, multiecho pulse sequences of the brain and surrounding structures were obtained without and with intravenous contrast. CONTRAST:  44mL GADAVIST GADOBUTROL 1 MMOL/ML IV SOLN COMPARISON:  None. FINDINGS: Study is partially degraded by motion. Brain: No acute infarction, hemorrhage, hydrocephalus, extra-axial collection or mass lesion. Subtle T2 hyperintensity is noted periventricular matter suggesting chronic small vessel disease. No focus of abnormal contrast enhancement seen. Vascular: Normal flow voids. Skull and upper cervical spine: Degenerative changes of the cervical spine are noted posterior disc osteophyte complex at C3-C4. Sinuses/Orbits: Mucous retention cyst is noted in the left maxillary antrum. Small bilateral mastoid effusions. Other: Partial empty sella. IMPRESSION: No evidence of intracranial metastatic disease. Electronically Signed   By: Pedro Earls M.D.   On: 12/11/2019 10:21   NM PET Image Initial (PI) Skull Base To Thigh  Result Date: 12/09/2019 CLINICAL DATA:  Initial treatment strategy for non-small cell lung cancer. EXAM: NUCLEAR MEDICINE PET SKULL BASE TO THIGH TECHNIQUE: 12.5 mCi F-18 FDG was injected intravenously. Full-ring PET imaging was performed from the skull base to thigh after the radiotracer. CT data was obtained and used for attenuation correction and anatomic localization. Fasting blood glucose: 155 mg/dl COMPARISON:  None FINDINGS: Mediastinal blood pool  activity: SUV max 3.59 Liver activity: SUV max NA NECK: No hypermetabolic lymph nodes in the neck. Incidental CT findings: none CHEST: Within the posterior right upper lobe there is a FDG avid lung mass which measures approximately 5.0 x 4.4 cm and has an SUV max of 11.67. There is associated postobstructive pneumonitis within the posterior and lateral right upper lobe. FDG avid tumor appears to extend across the fissure to involve the superior segment of right lower lobe, image 37/8. There are several solid pulmonary nodules identified in both lungs. -Index nodule within the anteromedial left upper lobe measures 0.9 cm and has an SUV max of 1.7. -Superior segment left lower lobe lung nodule measures 1 cm with SUV max of 1.6. Enlarged right sided mediastinal, right hilar and subcarinal lymph nodes identified: -Right paratracheal lymph node measures  2.3 cm with SUV max of 8.9. -subcarinal lymph node measures 2.6 cm with SUV max of 7.58. -Within the right supraclavicular region there is a node measuring 1.1 cm short axis with SUV max of 2.92. Incidental CT findings: Aortic atherosclerosis. Coronary artery calcifications identified. ABDOMEN/PELVIS: Indeterminate low-attenuation right adrenal nodule measures 2.3 cm and 7.5 HU. No significant FDG uptake is identified within this nodule. No FDG avid lesions identified within the liver, pancreas, or spleen. No hypermetabolic abdominopelvic lymph nodes. Incidental CT findings: Aortic atherosclerosis. Stone within the left mid kidney measures 6 mm. Bladder wall thickening is identified. Right hydrocele. Fat containing inguinal hernias are noted, right greater than left. SKELETON: No focal hypermetabolic activity to suggest skeletal metastasis. Incidental CT findings: none IMPRESSION: 1. Posterior right upper lobe lung mass is FDG avid and concerning for primary bronchogenic carcinoma. There is associated postobstructive pneumonitis within the posterior and lateral right  upper lobe. Tumor also appears to extend into the superior segment of right lower lobe. 2. FDG avid right hilar, subcarinal, right paratracheal lymph nodes are identified compatible with metastatic adenopathy. Borderline enlarged right supraclavicular lymph node exhibits mild FDG uptake and is equivocal for nodal metastases. 3. Bilateral pulmonary nodules are identified, suspicious for metastatic disease. 4. No findings of metastasis to the abdomen or pelvis. 5. Low-density right adrenal nodule without significant FDG uptake is indeterminate but favored to represent a benign adenoma. 6. Aortic atherosclerosis and coronary artery calcifications. Electronically Signed   By: Kerby Moors M.D.   On: 12/09/2019 11:06   CT RENAL STONE STUDY  Result Date: 12/09/2019 CLINICAL DATA:  Left flank pain for 3 days. EXAM: CT ABDOMEN AND PELVIS WITHOUT CONTRAST TECHNIQUE: Multidetector CT imaging of the abdomen and pelvis was performed following the standard protocol without IV contrast. COMPARISON:  None. FINDINGS: Lower chest: No acute abnormality. Hepatobiliary: No focal liver abnormality is seen. No gallstones, gallbladder wall thickening, or biliary dilatation. Pancreas: Unremarkable. No pancreatic ductal dilatation or surrounding inflammatory changes. Spleen: Normal in size without focal abnormality. Adrenals/Urinary Tract: The bilateral adrenal glands are normal. There is no hydronephrosis bilaterally. Small nonobstructing stones are identified in both kidneys. Renal vascular calcifications are so noted bilaterally. There is a 1.8 cm low-density in the upper to midpole medial right kidney this may be a cyst. Bladder is normal. Stomach/Bowel: Stomach is within normal limits. Appendix appears normal. No evidence of bowel wall thickening, distention, or inflammatory changes. Vascular/Lymphatic: Aortic atherosclerosis. No enlarged abdominal or pelvic lymph nodes. Reproductive: Prostate calcifications are identified. Other:  Right inguinal herniation of mesenteric fat is noted. Musculoskeletal: Degenerative joint changes of the spine are identified. IMPRESSION: 1. Small nonobstructing stones in both kidneys. No hydronephrosis is identified bilaterally. 2. No acute abnormality identified in the abdomen and pelvis. Electronically Signed   By: Abelardo Diesel M.D.   On: 12/09/2019 15:28   IR IMAGING GUIDED PORT INSERTION  Result Date: 12/31/2019 CLINICAL DATA:  Non-small cell lung cancer EXAM: RIGHT INTERNAL JUGULAR SINGLE LUMEN POWER PORT CATHETER INSERTION Date:  12/31/2019 12/31/2019 3:13 pm Radiologist:  M. Daryll Brod, MD Guidance:  Ultrasound fluoroscopic MEDICATIONS: Ancef 2 g; The antibiotic was administered within an appropriate time interval prior to skin puncture. ANESTHESIA/SEDATION: Versed 1.5 mg IV; Fentanyl 100 mcg IV; Moderate Sedation Time:  19 minutes The patient was continuously monitored during the procedure by the interventional radiology nurse under my direct supervision. FLUOROSCOPY TIME:  0 minutes, 42 seconds (25 mGy) COMPLICATIONS: None immediate. CONTRAST:  None PROCEDURE: Informed consent was obtained  from the patient following explanation of the procedure, risks, benefits and alternatives. The patient understands, agrees and consents for the procedure. All questions were addressed. A time out was performed. Maximal barrier sterile technique utilized including caps, mask, sterile gowns, sterile gloves, large sterile drape, hand hygiene, and 2% chlorhexidine scrub. Under sterile conditions and local anesthesia, right internal jugular micropuncture venous access was performed. Access was performed with ultrasound. Images were obtained for documentation of the patent right internal jugular vein. A guide wire was inserted followed by a transitional dilator. This allowed insertion of a guide wire and catheter into the IVC. Measurements were obtained from the SVC / RA junction back to the right IJ venotomy site. In  the right infraclavicular chest, a subcutaneous pocket was created over the second anterior rib. This was done under sterile conditions and local anesthesia. 1% lidocaine with epinephrine was utilized for this. A 2.5 cm incision was made in the skin. Blunt dissection was performed to create a subcutaneous pocket over the right pectoralis major muscle. The pocket was flushed with saline vigorously. There was adequate hemostasis. The port catheter was assembled and checked for leakage. The port catheter was secured in the pocket with two retention sutures. The tubing was tunneled subcutaneously to the right venotomy site and inserted into the SVC/RA junction through a valved peel-away sheath. Position was confirmed with fluoroscopy. Images were obtained for documentation. The patient tolerated the procedure well. No immediate complications. Incisions were closed in a two layer fashion with 4 - 0 Vicryl suture. Dermabond was applied to the skin. The port catheter was accessed, blood was aspirated followed by saline and heparin flushes. Needle was removed. A dry sterile dressing was applied. IMPRESSION: Ultrasound and fluoroscopically guided right internal jugular single lumen power port catheter insertion. Tip in the SVC/RA junction. Catheter ready for use. Electronically Signed   By: Jerilynn Mages.  Shick M.D.   On: 12/31/2019 15:19     ASSESSMENT/PLAN:  This is a very pleasant 64 year old African-American male recently diagnosed with stage IV non-small cell lung cancer, adenocarcinoma.  He presented with a large right upper lobe lung mass with right hilar, subcarinal, paratracheal lymphadenopathy, and right supraclavicular lymphadenopathy.  He also presented with bilateral pulmonary nodules.  He was diagnosed in February 2021.  He does not have any actionable mutations.  The patient is currently undergoing palliative systemic chemotherapy with carboplatin for an AUC of 5, Alimta 500 mg/m, Keytruda 200 mg IV every 3  weeks.  He is status post 1 cycle and he tolerated it well without any adverse side effects.  I will arrange for the patient to have weekly labs performed.   We will see the patient back for follow-up visit in 2 weeks for evaluation before starting cycle #2 treatment.   The patient inquired about if he is eligible for disability. Will provide him with information to see if he qualifies.   Provided the patient with constipation education. Advised to take a stool softener such as Colace or Senna to avoid constipation. Also encouraged to drinking plenty of fluid, eating fruits and vegetable, and being active also reduces the risk of constipation. If no bowel movement despite taking stool softeners, advised to take please take one of the following over the counter laxatives:  MiraLax, Milk of Magnesia or Mag Citrate everyday.   The patient was advised to call immediately if he has any concerning symptoms in the interval. The patient voices understanding of current disease status and treatment options and is in  agreement with the current care plan. All questions were answered. The patient knows to call the clinic with any problems, questions or concerns. We can certainly see the patient much sooner if necessary     Orders Placed This Encounter  Procedures  . CBC with Differential (Cancer Center Only)    Standing Status:   Standing    Number of Occurrences:   12    Standing Expiration Date:   01/07/2021  . CMP ([facility] only)    Standing Status:   Standing    Number of Occurrences:   12    Standing Expiration Date:   01/07/2021  . TSH    Standing Status:   Standing    Number of Occurrences:   12    Standing Expiration Date:   01/07/2021     Aleli Navedo L Alyrica Thurow, PA-C 01/08/20

## 2020-01-09 ENCOUNTER — Telehealth: Payer: Self-pay | Admitting: Physician Assistant

## 2020-01-09 NOTE — Telephone Encounter (Signed)
Scheduled appts per 3/3 los. Patient to get updated calender at next visit.

## 2020-01-10 ENCOUNTER — Encounter: Payer: Self-pay | Admitting: Medical Oncology

## 2020-01-10 ENCOUNTER — Telehealth: Payer: Self-pay | Admitting: Medical Oncology

## 2020-01-10 NOTE — Telephone Encounter (Signed)
Returned pt call. At pts request I faxed his PET scan and CT scan renal stone study to [facility].

## 2020-01-15 ENCOUNTER — Inpatient Hospital Stay: Payer: No Typology Code available for payment source

## 2020-01-15 ENCOUNTER — Telehealth: Payer: Self-pay | Admitting: Medical Oncology

## 2020-01-15 ENCOUNTER — Other Ambulatory Visit: Payer: Self-pay

## 2020-01-15 DIAGNOSIS — C3491 Malignant neoplasm of unspecified part of right bronchus or lung: Secondary | ICD-10-CM

## 2020-01-15 DIAGNOSIS — Z5112 Encounter for antineoplastic immunotherapy: Secondary | ICD-10-CM | POA: Diagnosis not present

## 2020-01-15 DIAGNOSIS — Z95828 Presence of other vascular implants and grafts: Secondary | ICD-10-CM

## 2020-01-15 LAB — CMP (CANCER CENTER ONLY)
ALT: 66 U/L — ABNORMAL HIGH (ref 0–44)
AST: 25 U/L (ref 15–41)
Albumin: 3.6 g/dL (ref 3.5–5.0)
Alkaline Phosphatase: 86 U/L (ref 38–126)
Anion gap: 7 (ref 5–15)
BUN: 15 mg/dL (ref 8–23)
CO2: 26 mmol/L (ref 22–32)
Calcium: 9.2 mg/dL (ref 8.9–10.3)
Chloride: 100 mmol/L (ref 98–111)
Creatinine: 0.83 mg/dL (ref 0.61–1.24)
GFR, Est AFR Am: >60 mL/min (ref >60)
GFR, Estimated: >60 mL/min (ref >60)
Glucose, Bld: 262 mg/dL — ABNORMAL HIGH (ref 70–99)
Potassium: 4.2 mmol/L (ref 3.5–5.1)
Sodium: 133 mmol/L — ABNORMAL LOW (ref 135–145)
Total Bilirubin: <0.2 mg/dL — ABNORMAL LOW (ref 0.3–1.2)
Total Protein: 6.5 g/dL (ref 6.5–8.1)

## 2020-01-15 LAB — CBC WITH DIFFERENTIAL (CANCER CENTER ONLY)
Abs Immature Granulocytes: 0.02 10*3/uL (ref 0.00–0.07)
Basophils Absolute: 0 10*3/uL (ref 0.0–0.1)
Basophils Relative: 0 %
Eosinophils Absolute: 0.3 10*3/uL (ref 0.0–0.5)
Eosinophils Relative: 5 %
HCT: 35.7 % — ABNORMAL LOW (ref 39.0–52.0)
Hemoglobin: 12.2 g/dL — ABNORMAL LOW (ref 13.0–17.0)
Immature Granulocytes: 0 %
Lymphocytes Relative: 36 %
Lymphs Abs: 2 10*3/uL (ref 0.7–4.0)
MCH: 31 pg (ref 26.0–34.0)
MCHC: 34.2 g/dL (ref 30.0–36.0)
MCV: 90.8 fL (ref 80.0–100.0)
Monocytes Absolute: 0.6 10*3/uL (ref 0.1–1.0)
Monocytes Relative: 11 %
Neutro Abs: 2.6 10*3/uL (ref 1.7–7.7)
Neutrophils Relative %: 48 %
Platelet Count: 143 10*3/uL — ABNORMAL LOW (ref 150–400)
RBC: 3.93 MIL/uL — ABNORMAL LOW (ref 4.22–5.81)
RDW: 13.8 % (ref 11.5–15.5)
WBC Count: 5.4 10*3/uL (ref 4.0–10.5)
nRBC: 0 % (ref 0.0–0.2)

## 2020-01-15 MED ORDER — HEPARIN SOD (PORK) LOCK FLUSH 100 UNIT/ML IV SOLN
500.0000 [IU] | Freq: Once | INTRAVENOUS | Status: AC
  Administered 2020-01-15: 10:00:00 500 [IU]
  Filled 2020-01-15: qty 5

## 2020-01-15 MED ORDER — SODIUM CHLORIDE 0.9% FLUSH
10.0000 mL | Freq: Once | INTRAVENOUS | Status: AC
  Administered 2020-01-15: 10:00:00 10 mL
  Filled 2020-01-15: qty 10

## 2020-01-15 NOTE — Telephone Encounter (Signed)
Pain- [facility] states [facility] is not [facility] "Degenerative changes in spine". He is taking hydrocodone BID prn ,but split dose because it makes him drowsy. He is going to try PT per PCP recommendation.   I told him his PCP will need to continue to manage this pain.

## 2020-01-18 ENCOUNTER — Emergency Department (HOSPITAL_COMMUNITY)
Admission: EM | Admit: 2020-01-18 | Discharge: 2020-01-18 | Disposition: A | Discharge status: Home / Self Care | Payer: No Typology Code available for payment source | Attending: Emergency Medicine | Admitting: Emergency Medicine

## 2020-01-18 ENCOUNTER — Emergency Department (HOSPITAL_BASED_OUTPATIENT_CLINIC_OR_DEPARTMENT_OTHER): Payer: No Typology Code available for payment source

## 2020-01-18 ENCOUNTER — Encounter (HOSPITAL_COMMUNITY): Payer: Self-pay

## 2020-01-18 ENCOUNTER — Other Ambulatory Visit: Payer: Self-pay

## 2020-01-18 DIAGNOSIS — Z7982 Long term (current) use of aspirin: Secondary | ICD-10-CM | POA: Insufficient documentation

## 2020-01-18 DIAGNOSIS — E119 Type 2 diabetes mellitus without complications: Secondary | ICD-10-CM | POA: Insufficient documentation

## 2020-01-18 DIAGNOSIS — M7989 Other specified soft tissue disorders: Secondary | ICD-10-CM

## 2020-01-18 DIAGNOSIS — R2243 Localized swelling, mass and lump, lower limb, bilateral: Secondary | ICD-10-CM | POA: Insufficient documentation

## 2020-01-18 DIAGNOSIS — M5431 Sciatica, right side: Secondary | ICD-10-CM | POA: Diagnosis not present

## 2020-01-18 DIAGNOSIS — K625 Hemorrhage of anus and rectum: Secondary | ICD-10-CM | POA: Diagnosis present

## 2020-01-18 DIAGNOSIS — Z7984 Long term (current) use of oral hypoglycemic drugs: Secondary | ICD-10-CM | POA: Diagnosis not present

## 2020-01-18 DIAGNOSIS — I10 Essential (primary) hypertension: Secondary | ICD-10-CM | POA: Insufficient documentation

## 2020-01-18 DIAGNOSIS — Z79899 Other long term (current) drug therapy: Secondary | ICD-10-CM | POA: Diagnosis not present

## 2020-01-18 DIAGNOSIS — F17210 Nicotine dependence, cigarettes, uncomplicated: Secondary | ICD-10-CM | POA: Insufficient documentation

## 2020-01-18 DIAGNOSIS — R609 Edema, unspecified: Secondary | ICD-10-CM

## 2020-01-18 LAB — BASIC METABOLIC PANEL
Anion gap: 9 (ref 5–15)
BUN: 25 mg/dL — ABNORMAL HIGH (ref 8–23)
CO2: 25 mmol/L (ref 22–32)
Calcium: 10.1 mg/dL (ref 8.9–10.3)
Chloride: 102 mmol/L (ref 98–111)
Creatinine, Ser: 0.75 mg/dL (ref 0.61–1.24)
GFR calc Af Amer: >60 mL/min (ref >60)
GFR calc non Af Amer: >60 mL/min (ref >60)
Glucose, Bld: 184 mg/dL — ABNORMAL HIGH (ref 70–99)
Potassium: 4 mmol/L (ref 3.5–5.1)
Sodium: 136 mmol/L (ref 135–145)

## 2020-01-18 LAB — PROTIME-INR
INR: 1 (ref 0.8–1.2)
Prothrombin Time: 13.6 seconds (ref 11.4–15.2)

## 2020-01-18 LAB — CBC WITH DIFFERENTIAL/PLATELET
Abs Immature Granulocytes: 0.04 10*3/uL (ref 0.00–0.07)
Basophils Absolute: 0 10*3/uL (ref 0.0–0.1)
Basophils Relative: 0 %
Eosinophils Absolute: 0.3 10*3/uL (ref 0.0–0.5)
Eosinophils Relative: 6 %
HCT: 39 % (ref 39.0–52.0)
Hemoglobin: 13.3 g/dL (ref 13.0–17.0)
Immature Granulocytes: 1 %
Lymphocytes Relative: 36 %
Lymphs Abs: 2.1 10*3/uL (ref 0.7–4.0)
MCH: 32.2 pg (ref 26.0–34.0)
MCHC: 34.1 g/dL (ref 30.0–36.0)
MCV: 94.4 fL (ref 80.0–100.0)
Monocytes Absolute: 0.6 10*3/uL (ref 0.1–1.0)
Monocytes Relative: 10 %
Neutro Abs: 2.8 10*3/uL (ref 1.7–7.7)
Neutrophils Relative %: 47 %
Platelets: 239 10*3/uL (ref 150–400)
RBC: 4.13 MIL/uL — ABNORMAL LOW (ref 4.22–5.81)
RDW: 14.4 % (ref 11.5–15.5)
WBC: 5.8 10*3/uL (ref 4.0–10.5)
nRBC: 0 % (ref 0.0–0.2)

## 2020-01-18 LAB — TYPE AND SCREEN
ABO/RH(D): A POS
Antibody Screen: NEGATIVE

## 2020-01-18 LAB — ABO/RH: ABO/RH(D): A POS

## 2020-01-18 LAB — POC OCCULT BLOOD, ED: Fecal Occult Bld: NEGATIVE

## 2020-01-18 MED ORDER — OXYCODONE-ACETAMINOPHEN 5-325 MG PO TABS: 1.0000 | ORAL_TABLET | Freq: Four times a day (QID) | ORAL | 0 refills | Status: DC | PRN

## 2020-01-18 NOTE — Discharge Instructions (Addendum)
Follow up with your primary care doctor to discuss further evaluation.  Return to the ED for increased bleeding, lightheadedness, vomiting of blood.  Continue your evaluation with the [facility] regarding your sciatica pain

## 2020-01-18 NOTE — ED Triage Notes (Signed)
EMS reports from home, [place] Pt, c/o slight bright red blood in stool this morning at 0300 and again at 1200. Pt also c/o chronic right hip pain x 5 weeks.  BP 194/80 HR 96 RR 18 Sp02 96 RA CBG 200

## 2020-01-18 NOTE — Progress Notes (Signed)
Lower extremity venous has been completed.   Preliminary results in CV Proc.   Abram Sander 01/18/2020 3:15 PM

## 2020-01-18 NOTE — ED Provider Notes (Signed)
[facility] DEPT Provider Note   CSN: [id number] Arrival date & time: 01/18/20  1404     History Chief Complaint  Patient presents with  . GI Bleeding  . Hip Pain    Kevin Lambert. is a 64 y.o. male.  HPI   Pt started having blood in his stool this am.  He noticed first at 0300, the next time at 1200.  He noticed blood around the stool.  He denies any fever, no abdominal pain, no vomiting.  No prior history of same.  Pt takes aspirin.  No blood thinners otherwise.  Pt is on chemo for lung ca.  Last treatment was on the 24th.  Pt has also bene having pain in his right buttock and leg for the last several weeks.  [facility]  He has had ct scans, MRI and xrays. Pt states he was treated with steroid shots.  He is scheduled for further workup. He has also noticed swelling in his left leg but that leg does not hurt.  He was prescribed neurontin and hydrocodone.  Past Medical History:  Diagnosis Date  . Arthritis   . Depression   . DM (diabetes mellitus) ([place])    type II  . Dyslipidemia   . Dyspnea    unable to walk much - he thinks it is from   . GERD (gastroesophageal reflux disease)   . Hepatitis    Hepatitis C- treated  . History of kidney stones   . HTN (hypertension)   . Neuropathy   . OSA on CPAP   . PTSD (post-traumatic stress disorder)     Patient Active Problem List   Diagnosis Date Noted  . Port-A-Cath in place 01/15/2020  . Encounter for antineoplastic chemotherapy 12/16/2019  . Goals of care, counseling/discussion 12/16/2019  . Mediastinal adenopathy 12/10/2019  . Non-small cell cancer of right lung ([place]) 11/29/2019  . Tobacco abuse 11/29/2019  . HTN (hypertension) 11/29/2019  . DM (diabetes mellitus) ([place]) 11/29/2019    Past Surgical History:  Procedure Laterality Date  . BIOPSY OF MEDIASTINAL MASS  12/10/2019   Procedure: BIOPSY OF MEDIASTINAL MASS;  Surgeon: Garner Nash, DO;  Location: [facility];  Service:  Pulmonary;;  right upper lobe  . BRONCHIAL NEEDLE ASPIRATION BIOPSY  12/10/2019   Procedure: BRONCHIAL NEEDLE ASPIRATION BIOPSIES;  Surgeon: Garner Nash, DO;  Location: [facility];  Service: Pulmonary;;  . COLONOSCOPY    . IR IMAGING GUIDED PORT INSERTION  12/31/2019  . kidney stone    . URETERAL STENT PLACEMENT    . Ureteral Stent Removed    . VIDEO BRONCHOSCOPY WITH ENDOBRONCHIAL ULTRASOUND N/A 12/10/2019   Procedure: VIDEO BRONCHOSCOPY WITH ENDOBRONCHIAL ULTRASOUND;  Surgeon: Garner Nash, DO;  Location: [facility];  Service: Pulmonary;  Laterality: N/A;       Family History  Problem Relation Age of Onset  . Liver disease Mother   . Diabetes Father     Social History   Tobacco Use  . Smoking status: Current Every Day Smoker    Packs/day: 0.60    Years: 50.00    Pack years: 30.00    Types: Cigarettes  . Smokeless tobacco: Never Used  . Tobacco comment: Pt. stated he is still trying to quit.  Substance Use Topics  . Alcohol use: Yes    Alcohol/week: 6.0 standard drinks    Types: 6 Cans of beer per week  . Drug use: Not Currently    Home  Medications Prior to Admission medications   Medication Sig Start Date End Date Taking? Authorizing Provider  albuterol (VENTOLIN HFA) 108 (90 Base) MCG/ACT inhaler Inhale 2 puffs into the lungs every 4 (four) hours as needed for wheezing or shortness of breath. 01/02/20  Yes Icard, Bradley L, DO  amLODipine (NORVASC) 10 MG tablet Take 10 mg by mouth daily.   Yes [provider]  aspirin EC 81 MG tablet Take 81 mg by mouth daily.   Yes [provider]  atorvastatin (LIPITOR) 10 MG tablet Take 10 mg by mouth daily.   Yes [provider]  bisacodyl (DULCOLAX) 5 MG EC tablet Take 5 mg by mouth daily as needed for moderate constipation.   Yes [provider]  calcium carbonate (TUMS - DOSED IN MG ELEMENTAL CALCIUM) 500 MG chewable tablet Chew 2 tablets by mouth daily as needed for indigestion or  heartburn.   Yes [provider]  folic acid (FOLVITE) 1 MG tablet Take 1 tablet (1 mg total) by mouth daily. Pt needs to pick up today and start ASAP . He will pay out of pocket. He is waiting on the [place] [facility] to send his folic acid which will be next week. 12/27/19  Yes Heilingoetter, Cassandra L, PA-C  glipiZIDE (GLUCOTROL) 5 MG tablet Take 5 mg by mouth daily before breakfast.    Yes [provider]  HYDROcodone-acetaminophen (NORCO/VICODIN) 5-325 MG tablet Take 1 tablet by mouth every 6 (six) hours as needed for moderate pain.   Yes [provider]  lidocaine-prilocaine (EMLA) cream Apply to Port-A-Cath site 30-60-minute before treatment. 12/25/19  Yes Curt Bears, MD  Menthol ([employer] LOZENGES MT) Use as directed 1 lozenge in the mouth or throat daily as needed (throat).    Yes [provider]  naproxen (NAPROSYN) 500 MG tablet Take 500 mg by mouth 2 (two) times daily with a meal.   Yes [provider]  psyllium (METAMUCIL) 58.6 % powder Take 1 packet by mouth daily.   Yes [provider]  sildenafil (VIAGRA) 100 MG tablet Take 100 mg by mouth daily as needed for erectile dysfunction.   Yes [provider]  triamterene-hydrochlorothiazide (MAXZIDE-25) 37.5-25 MG tablet Take 1 tablet by mouth daily as needed (swelling).    Yes [provider]  umeclidinium-vilanterol (ANORO ELLIPTA) 62.5-25 MCG/INH AEPB Inhale 1 puff into the lungs daily. 01/02/20  Yes Icard, Octavio Graves, DO  oxyCODONE-acetaminophen (PERCOCET/ROXICET) 5-325 MG tablet Take 1 tablet by mouth every 6 (six) hours as needed for severe pain. 01/18/20   Dorie Rank, MD  umeclidinium-vilanterol (ANORO ELLIPTA) 62.5-25 MCG/INH AEPB Inhale 1 puff into the lungs daily. Patient not taking: Reported on 01/18/2020 01/02/20   Garner Nash, DO    Allergies    Metformin and related  Review of Systems   Review of Systems  Musculoskeletal:       Leg swelling and pain for 5 weeks    All other systems reviewed and are negative.   Physical Exam Updated Vital Signs BP (!) 149/75   Pulse 96   Temp 98.6 F (37 C) (Oral)   Resp 18   SpO2 100%   Physical Exam Vitals and nursing note reviewed.  Constitutional:      General: He is not in acute distress.    Appearance: He is well-developed.  HENT:     Head: Normocephalic and atraumatic.     Right Ear: External ear normal.     Left Ear: External ear normal.  Eyes:     General: No scleral icterus.       Right eye: No discharge.        Left eye: No discharge.     Conjunctiva/sclera: Conjunctivae normal.  Neck:     Trachea: No tracheal deviation.  Cardiovascular:     Rate and Rhythm: Normal rate and regular rhythm.  Pulmonary:     Effort: Pulmonary effort is normal. No respiratory distress.     Breath sounds: Normal breath sounds. No stridor. No wheezing or rales.  Abdominal:     General: Bowel sounds are normal. There is no distension.     Palpations: Abdomen is soft.     Tenderness: There is no abdominal tenderness. There is no guarding or rebound.  Genitourinary:    Comments: No gross blood rectal exam  Musculoskeletal:     Cervical back: Neck supple.     Left lower leg: Edema present.  Skin:    General: Skin is warm and dry.     Findings: No rash.  Neurological:     Mental Status: He is alert.     Cranial Nerves: No cranial nerve deficit (no facial droop, extraocular movements intact, no slurred speech).     Sensory: No sensory deficit.     Motor: No abnormal muscle tone or seizure activity.     Coordination: Coordination normal.     ED Results / Procedures / Treatments   Labs (all labs ordered are listed, but only abnormal results are displayed) Labs Reviewed  CBC WITH DIFFERENTIAL/PLATELET - Abnormal; Notable for the following components:      Result Value   RBC 4.13 (*)    All other components within normal limits  BASIC METABOLIC PANEL - Abnormal; Notable for the following components:    Glucose, Bld 184 (*)    BUN 25 (*)    All other components within normal limits  PROTIME-INR  POC OCCULT BLOOD, ED  TYPE AND SCREEN    EKG None  Radiology VAS [place] LOWER EXTREMITY VENOUS (DVT) (ONLY [facility] & [facility])  Result Date: 01/18/2020  Lower Venous DVTStudy Indications: Swelling, and Edema.  Comparison Study: no prior Performing Technologist: Abram Sander RVS  Examination Guidelines: A complete evaluation includes B-mode imaging, spectral Doppler, color Doppler, and power Doppler as needed of all accessible portions of each vessel. Bilateral testing is considered an integral part of a complete examination. Limited examinations for reoccurring indications may be performed as noted. The reflux portion of the exam is performed with the patient in reverse Trendelenburg.  +---------+---------------+---------+-----------+----------+--------------+ RIGHT    CompressibilityPhasicitySpontaneityPropertiesThrombus Aging +---------+---------------+---------+-----------+----------+--------------+ CFV      Full           Yes      Yes                                 +---------+---------------+---------+-----------+----------+--------------+ SFJ      Full                                                        +---------+---------------+---------+-----------+----------+--------------+ FV Prox  Full                                                        +---------+---------------+---------+-----------+----------+--------------+  FV Mid   Full                                                        +---------+---------------+---------+-----------+----------+--------------+ FV DistalFull                                                        +---------+---------------+---------+-----------+----------+--------------+ PFV      Full                                                        +---------+---------------+---------+-----------+----------+--------------+ POP      Full            Yes      Yes                                 +---------+---------------+---------+-----------+----------+--------------+ PTV      Full                                                        +---------+---------------+---------+-----------+----------+--------------+ PERO                                                  Not visualized +---------+---------------+---------+-----------+----------+--------------+   +---------+---------------+---------+-----------+----------+--------------+ LEFT     CompressibilityPhasicitySpontaneityPropertiesThrombus Aging +---------+---------------+---------+-----------+----------+--------------+ CFV      Full           Yes      Yes                                 +---------+---------------+---------+-----------+----------+--------------+ SFJ      Full                                                        +---------+---------------+---------+-----------+----------+--------------+ FV Prox  Full                                                        +---------+---------------+---------+-----------+----------+--------------+ FV Mid   Full                                                        +---------+---------------+---------+-----------+----------+--------------+  FV DistalFull                                                        +---------+---------------+---------+-----------+----------+--------------+ PFV      Full                                                        +---------+---------------+---------+-----------+----------+--------------+ POP      Full           Yes      Yes                                 +---------+---------------+---------+-----------+----------+--------------+ PTV      Full                                                        +---------+---------------+---------+-----------+----------+--------------+ PERO                                                  Not  visualized +---------+---------------+---------+-----------+----------+--------------+     Summary: BILATERAL: - No evidence of deep vein thrombosis seen in the lower extremities, bilaterally.   *See table(s) above for measurements and observations.    Preliminary     Procedures Procedures (including critical care time)  Medications Ordered in ED Medications - No data to display  ED Course  I have reviewed the triage vital signs and the nursing notes.  Pertinent labs & imaging results that were available during my care of the patient were reviewed by me and considered in my medical decision making (see chart for details).  Clinical Course as of Jan 17 1642  Sat Jan 18, 2020  1613 CBC normal.  Fecal occult negative.   [SP]  2330 DVT negative bilaterally   [JK]    Clinical Course User Index [JK] Dorie Rank, MD   MDM Rules/Calculators/A&P                      No signs of blood in stool.  CBC normal.  Doubt serious GI bleeding . Stable for outpatient follow up .  Warning signs and precautions discussed.    Leg pain most likely related to sciatica.  Per pt he has had imaging at the [facility].  I do not have access to that but do no signs of acute neuro dysfunction to require additional imaging.  Pt is having pain despite the hydrocodone. PDMP checked.  Will give short course of oxycodone.  [facility] Final [facility] Impression(s) / ED Diagnoses Final diagnoses:  Rectal bleeding  Sciatica of right side    Rx / DC Orders ED Discharge Orders         Ordered    oxyCODONE-acetaminophen (PERCOCET/ROXICET) 5-325 MG tablet  Every 6 hours PRN  01/18/20 1640           Dorie Rank, MD 01/18/20 [phone]

## 2020-01-21 ENCOUNTER — Encounter: Payer: Self-pay | Admitting: *Deleted

## 2020-01-21 NOTE — Progress Notes (Signed)
Oncology Nurse Navigator Documentation  Oncology Nurse Navigator Flowsheets 01/21/2020  Navigator Location [facility]  Navigator Encounter Type Other/I followed up on [facility] report. There is a hold on test.  I contacted [facility] for an update.   Barriers/Navigation Needs Coordination of Care  Interventions Coordination of Care  Acuity Level 2-Minimal Needs (1-2 Barriers Identified)  Coordination of Care -  Time Spent with Patient 15

## 2020-01-22 ENCOUNTER — Inpatient Hospital Stay: Payer: No Typology Code available for payment source

## 2020-01-22 ENCOUNTER — Inpatient Hospital Stay (HOSPITAL_BASED_OUTPATIENT_CLINIC_OR_DEPARTMENT_OTHER): Payer: No Typology Code available for payment source | Admitting: Internal Medicine

## 2020-01-22 ENCOUNTER — Other Ambulatory Visit: Payer: Self-pay

## 2020-01-22 ENCOUNTER — Encounter: Payer: Self-pay | Admitting: Internal Medicine

## 2020-01-22 ENCOUNTER — Telehealth: Payer: Self-pay | Admitting: Medical Oncology

## 2020-01-22 VITALS — BP 144/74 | HR 100 | Temp 98.9°F | Resp 20 | Ht 69.0 in | Wt 243.0 lb

## 2020-01-22 DIAGNOSIS — I10 Essential (primary) hypertension: Secondary | ICD-10-CM

## 2020-01-22 DIAGNOSIS — C3491 Malignant neoplasm of unspecified part of right bronchus or lung: Secondary | ICD-10-CM

## 2020-01-22 DIAGNOSIS — Z5111 Encounter for antineoplastic chemotherapy: Secondary | ICD-10-CM

## 2020-01-22 DIAGNOSIS — Z95828 Presence of other vascular implants and grafts: Secondary | ICD-10-CM

## 2020-01-22 DIAGNOSIS — Z720 Tobacco use: Secondary | ICD-10-CM | POA: Diagnosis not present

## 2020-01-22 DIAGNOSIS — Z5112 Encounter for antineoplastic immunotherapy: Secondary | ICD-10-CM | POA: Diagnosis not present

## 2020-01-22 LAB — CBC WITH DIFFERENTIAL (CANCER CENTER ONLY)
Abs Immature Granulocytes: 0.03 10*3/uL (ref 0.00–0.07)
Basophils Absolute: 0 10*3/uL (ref 0.0–0.1)
Basophils Relative: 0 %
Eosinophils Absolute: 0.2 10*3/uL (ref 0.0–0.5)
Eosinophils Relative: 4 %
HCT: 38 % — ABNORMAL LOW (ref 39.0–52.0)
Hemoglobin: 12.9 g/dL — ABNORMAL LOW (ref 13.0–17.0)
Immature Granulocytes: 1 %
Lymphocytes Relative: 38 %
Lymphs Abs: 2 10*3/uL (ref 0.7–4.0)
MCH: 31.4 pg (ref 26.0–34.0)
MCHC: 33.9 g/dL (ref 30.0–36.0)
MCV: 92.5 fL (ref 80.0–100.0)
Monocytes Absolute: 0.6 10*3/uL (ref 0.1–1.0)
Monocytes Relative: 12 %
Neutro Abs: 2.4 10*3/uL (ref 1.7–7.7)
Neutrophils Relative %: 45 %
Platelet Count: 271 10*3/uL (ref 150–400)
RBC: 4.11 MIL/uL — ABNORMAL LOW (ref 4.22–5.81)
RDW: 14 % (ref 11.5–15.5)
WBC Count: 5.3 10*3/uL (ref 4.0–10.5)
nRBC: 0 % (ref 0.0–0.2)

## 2020-01-22 LAB — CMP (CANCER CENTER ONLY)
ALT: 55 U/L — ABNORMAL HIGH (ref 0–44)
AST: 24 U/L (ref 15–41)
Albumin: 3.8 g/dL (ref 3.5–5.0)
Alkaline Phosphatase: 93 U/L (ref 38–126)
Anion gap: 10 (ref 5–15)
BUN: 17 mg/dL (ref 8–23)
CO2: 26 mmol/L (ref 22–32)
Calcium: 10.2 mg/dL (ref 8.9–10.3)
Chloride: 97 mmol/L — ABNORMAL LOW (ref 98–111)
Creatinine: 1.01 mg/dL (ref 0.61–1.24)
GFR, Est AFR Am: >60 mL/min (ref >60)
GFR, Estimated: >60 mL/min (ref >60)
Glucose, Bld: 282 mg/dL — ABNORMAL HIGH (ref 70–99)
Potassium: 4.3 mmol/L (ref 3.5–5.1)
Sodium: 133 mmol/L — ABNORMAL LOW (ref 135–145)
Total Bilirubin: <0.2 mg/dL — ABNORMAL LOW (ref 0.3–1.2)
Total Protein: 6.8 g/dL (ref 6.5–8.1)

## 2020-01-22 MED ORDER — SODIUM CHLORIDE 0.9 % IV SOLN
200.0000 mg | Freq: Once | INTRAVENOUS | Status: AC
  Administered 2020-01-22: 200 mg via INTRAVENOUS
  Filled 2020-01-22: qty 8

## 2020-01-22 MED ORDER — DEXAMETHASONE SODIUM PHOSPHATE 10 MG/ML IJ SOLN
10.0000 mg | Freq: Once | INTRAMUSCULAR | Status: AC
  Administered 2020-01-22: 13:00:00 10 mg via INTRAVENOUS

## 2020-01-22 MED ORDER — SODIUM CHLORIDE 0.9 % IV SOLN
500.0000 mg/m2 | Freq: Once | INTRAVENOUS | Status: AC
  Administered 2020-01-22: 1200 mg via INTRAVENOUS
  Filled 2020-01-22: qty 40

## 2020-01-22 MED ORDER — PALONOSETRON HCL INJECTION 0.25 MG/5ML
INTRAVENOUS | Status: AC
  Filled 2020-01-22: qty 5

## 2020-01-22 MED ORDER — HEPARIN SOD (PORK) LOCK FLUSH 100 UNIT/ML IV SOLN
500.0000 [IU] | Freq: Once | INTRAVENOUS | Status: AC | PRN
  Administered 2020-01-22: 500 [IU]
  Filled 2020-01-22: qty 5

## 2020-01-22 MED ORDER — SODIUM CHLORIDE 0.9% FLUSH
10.0000 mL | Freq: Once | INTRAVENOUS | Status: AC
  Administered 2020-01-22: 10 mL
  Filled 2020-01-22: qty 10

## 2020-01-22 MED ORDER — SODIUM CHLORIDE 0.9 % IV SOLN
150.0000 mg | Freq: Once | INTRAVENOUS | Status: AC
  Administered 2020-01-22: 14:00:00 150 mg via INTRAVENOUS
  Filled 2020-01-22: qty 150

## 2020-01-22 MED ORDER — SODIUM CHLORIDE 0.9 % IV SOLN
750.0000 mg | Freq: Once | INTRAVENOUS | Status: AC
  Administered 2020-01-22: 16:00:00 750 mg via INTRAVENOUS
  Filled 2020-01-22: qty 75

## 2020-01-22 MED ORDER — DEXAMETHASONE SODIUM PHOSPHATE 10 MG/ML IJ SOLN
INTRAMUSCULAR | Status: AC
  Filled 2020-01-22: qty 1

## 2020-01-22 MED ORDER — SODIUM CHLORIDE 0.9% FLUSH
10.0000 mL | INTRAVENOUS | Status: DC | PRN
  Administered 2020-01-22: 10 mL
  Filled 2020-01-22: qty 10

## 2020-01-22 MED ORDER — SODIUM CHLORIDE 0.9 % IV SOLN
Freq: Once | INTRAVENOUS | Status: AC
  Filled 2020-01-22: qty 250

## 2020-01-22 MED ORDER — PALONOSETRON HCL INJECTION 0.25 MG/5ML
0.2500 mg | Freq: Once | INTRAVENOUS | Status: AC
  Administered 2020-01-22: 13:00:00 0.25 mg via INTRAVENOUS

## 2020-01-22 NOTE — Patient Instructions (Signed)
[facility] Discharge Instructions for Patients Receiving Chemotherapy  Today you received the following chemotherapy agents: Keytruda, Alimta, Carboplatin   To help prevent nausea and vomiting after your treatment, we encourage you to take your nausea medication as directed.    If you develop nausea and vomiting that is not controlled by your nausea medication, call the clinic.   BELOW ARE SYMPTOMS THAT SHOULD BE REPORTED IMMEDIATELY:  *FEVER GREATER THAN 100.5 F  *CHILLS WITH OR WITHOUT FEVER  NAUSEA AND VOMITING THAT IS NOT CONTROLLED WITH YOUR NAUSEA MEDICATION  *UNUSUAL SHORTNESS OF BREATH  *UNUSUAL BRUISING OR BLEEDING  TENDERNESS IN MOUTH AND THROAT WITH OR WITHOUT PRESENCE OF ULCERS  *URINARY PROBLEMS  *BOWEL PROBLEMS  UNUSUAL RASH Items with * indicate a potential emergency and should be followed up as soon as possible.  Feel free to call the clinic should you have any questions or concerns. The clinic phone number is [phone].  Please show the [facility] at check-in to the Emergency Department and triage nurse.

## 2020-01-22 NOTE — Progress Notes (Signed)
Kevin Lambert [phone]   [phone]  OFFICE PROGRESS NOTE  [name], MD [address] [place]  [place] [address]  DIAGNOSIS: Stage IV (T2b, N3, M1a) non-small cell lung cancer, adenocarcinoma presented with large right upper lobe lung mass in addition to mediastinal and right supraclavicular lymphadenopathy as well as bilateral pulmonary nodules diagnosed in February 2021.  MOLECULAR STUDY by [employer]:  KRASG12C, 4.3%, Binimetinib  [id number], 0.9%, Niraparib, Olaparib, Rucaparib,Talazoparib, Tazemetostat  [id number], 1.7%, None   PRIOR THERAPY: None  CURRENT THERAPY: Systemic chemotherapy with carboplatin for AUC of 5, Alimta 500 mg/M2 and Keytruda 200 mg IV every 3 weeks.  First dose December 31, 2019.  Status post 1 cycle.  INTERVAL HISTORY: Kevin Lambert. 64 y.o. male returns to the clinic today for follow-up visit.  The patient is feeling fine today with no concerning complaints.  He denied having any chest pain, shortness of breath except with exertion with no cough or hemoptysis.  He denied having any fever or chills.  He has no nausea, vomiting, diarrhea or constipation.  He continues to have weakness of the lower extremities and he is scheduled to have physical therapy at the [place] facility soon.  The patient also has right hip pain and he was given prescription for Percocet at the emergency department.  He is here today for evaluation before starting cycle #2 of his chemotherapy.   MEDICAL HISTORY: Past Medical History:  Diagnosis Date  . Arthritis   . Depression   . DM (diabetes mellitus) ([place])    type II  . Dyslipidemia   . Dyspnea    unable to walk much - he thinks it is from   . GERD (gastroesophageal reflux disease)   . Hepatitis    Hepatitis C- treated  . History of kidney stones   . HTN (hypertension)   . Neuropathy   . OSA on CPAP   . PTSD (post-traumatic stress disorder)     ALLERGIES:  is allergic to  metformin and related.  MEDICATIONS:  Current Outpatient Medications  Medication Sig Dispense Refill  . albuterol (VENTOLIN HFA) 108 (90 Base) MCG/ACT inhaler Inhale 2 puffs into the lungs every 4 (four) hours as needed for wheezing or shortness of breath. 18 g 3  . amLODipine (NORVASC) 10 MG tablet Take 10 mg by mouth daily.    Marland Kitchen aspirin EC 81 MG tablet Take 81 mg by mouth daily.    Marland Kitchen atorvastatin (LIPITOR) 10 MG tablet Take 10 mg by mouth daily.    . bisacodyl (DULCOLAX) 5 MG EC tablet Take 5 mg by mouth daily as needed for moderate constipation.    . calcium carbonate (TUMS - DOSED IN MG ELEMENTAL CALCIUM) 500 MG chewable tablet Chew 2 tablets by mouth daily as needed for indigestion or heartburn.    . folic acid (FOLVITE) 1 MG tablet Take 1 tablet (1 mg total) by mouth daily. Pt needs to pick up today and start ASAP . He will pay out of pocket. He is waiting on the [place] to send his folic acid which will be next week. 7 tablet 0  . glipiZIDE (GLUCOTROL) 5 MG tablet Take 5 mg by mouth daily before breakfast.     . HYDROcodone-acetaminophen (NORCO/VICODIN) 5-325 MG tablet Take 1 tablet by mouth every 6 (six) hours as needed for moderate pain.    Marland Kitchen lidocaine-prilocaine (EMLA) cream Apply to Port-A-Cath site 30-60-minute before treatment. 30 g 0  . Menthol (  [employer] LOZENGES MT) Use as directed 1 lozenge in the mouth or throat daily as needed (throat).     . naproxen (NAPROSYN) 500 MG tablet Take 500 mg by mouth 2 (two) times daily with a meal.    . oxyCODONE-acetaminophen (PERCOCET/ROXICET) 5-325 MG tablet Take 1 tablet by mouth every 6 (six) hours as needed for severe pain. 12 tablet 0  . psyllium (METAMUCIL) 58.6 % powder Take 1 packet by mouth daily.    . sildenafil (VIAGRA) 100 MG tablet Take 100 mg by mouth daily as needed for erectile dysfunction.    . triamterene-hydrochlorothiazide (MAXZIDE-25) 37.5-25 MG tablet Take 1 tablet by mouth daily as needed (swelling).     Marland Kitchen umeclidinium-vilanterol  (ANORO ELLIPTA) 62.5-25 MCG/INH AEPB Inhale 1 puff into the lungs daily. 60 each 11  . umeclidinium-vilanterol (ANORO ELLIPTA) 62.5-25 MCG/INH AEPB Inhale 1 puff into the lungs daily. (Patient not taking: Reported on 01/18/2020) 7 each 0   No current facility-administered medications for this visit.    SURGICAL HISTORY:  Past Surgical History:  Procedure Laterality Date  . BIOPSY OF MEDIASTINAL MASS  12/10/2019   Procedure: BIOPSY OF MEDIASTINAL MASS;  Surgeon: Garner Nash, DO;  Location: [facility];  Service: Pulmonary;;  right upper lobe  . BRONCHIAL NEEDLE ASPIRATION BIOPSY  12/10/2019   Procedure: BRONCHIAL NEEDLE ASPIRATION BIOPSIES;  Surgeon: Garner Nash, DO;  Location: [facility];  Service: Pulmonary;;  . COLONOSCOPY    . IR IMAGING GUIDED PORT INSERTION  12/31/2019  . kidney stone    . URETERAL STENT PLACEMENT    . Ureteral Stent Removed    . VIDEO BRONCHOSCOPY WITH ENDOBRONCHIAL ULTRASOUND N/A 12/10/2019   Procedure: VIDEO BRONCHOSCOPY WITH ENDOBRONCHIAL ULTRASOUND;  Surgeon: Garner Nash, DO;  Location: [facility];  Service: Pulmonary;  Laterality: N/A;    REVIEW OF SYSTEMS:  A comprehensive review of systems was negative except for: Constitutional: positive for fatigue Respiratory: positive for dyspnea on exertion Musculoskeletal: positive for arthralgias   PHYSICAL EXAMINATION: General appearance: alert, cooperative, fatigued and no distress Head: Normocephalic, without obvious abnormality, atraumatic Neck: no adenopathy, no JVD, supple, symmetrical, trachea midline and thyroid not enlarged, symmetric, no tenderness/mass/nodules Lymph nodes: Cervical, supraclavicular, and axillary nodes normal. Resp: clear to auscultation bilaterally Back: symmetric, no curvature. ROM normal. No CVA tenderness. Cardio: regular rate and rhythm, S1, S2 normal, no murmur, click, rub or gallop GI: soft, non-tender; bowel sounds normal; no masses,  no organomegaly Extremities:  extremities normal, atraumatic, no cyanosis or edema  ECOG PERFORMANCE STATUS: 1 - Symptomatic but completely ambulatory  Blood pressure (!) 144/74, pulse 100, temperature 98.9 F (37.2 C), resp. rate 20, height 5\' 9"  (1.753 m), weight 243 lb (110.2 kg), SpO2 97 %.  LABORATORY DATA: Lab Results  Component Value Date   WBC 5.8 01/18/2020   HGB 13.3 01/18/2020   HCT 39.0 01/18/2020   MCV 94.4 01/18/2020   PLT 239 01/18/2020      Chemistry      Component Value Date/Time   NA 136 01/18/2020 1443   K 4.0 01/18/2020 1443   CL 102 01/18/2020 1443   CO2 25 01/18/2020 1443   BUN 25 (H) 01/18/2020 1443   CREATININE 0.75 01/18/2020 1443   CREATININE 0.83 01/15/2020 0950      Component Value Date/Time   CALCIUM 10.1 01/18/2020 1443   ALKPHOS 86 01/15/2020 0950   AST 25 01/15/2020 0950   ALT 66 (H) 01/15/2020 0950   BILITOT <0.2 (L) 01/15/2020 0347  RADIOGRAPHIC STUDIES: IR IMAGING GUIDED PORT INSERTION  Result Date: 12/31/2019 CLINICAL DATA:  Non-small cell lung cancer EXAM: RIGHT INTERNAL JUGULAR SINGLE LUMEN POWER PORT CATHETER INSERTION Date:  12/31/2019 12/31/2019 3:13 pm Radiologist:  M. Daryll Brod, MD Guidance:  Ultrasound fluoroscopic MEDICATIONS: Ancef 2 g; The antibiotic was administered within an appropriate time interval prior to skin puncture. ANESTHESIA/SEDATION: Versed 1.5 mg IV; Fentanyl 100 mcg IV; Moderate Sedation Time:  19 minutes The patient was continuously monitored during the procedure by the interventional radiology nurse under my direct supervision. FLUOROSCOPY TIME:  0 minutes, 42 seconds (25 mGy) COMPLICATIONS: None immediate. CONTRAST:  None PROCEDURE: Informed consent was obtained from the patient following explanation of the procedure, risks, benefits and alternatives. The patient understands, agrees and consents for the procedure. All questions were addressed. A time out was performed. Maximal barrier sterile technique utilized including caps, mask,  sterile gowns, sterile gloves, large sterile drape, hand hygiene, and 2% chlorhexidine scrub. Under sterile conditions and local anesthesia, right internal jugular micropuncture venous access was performed. Access was performed with ultrasound. Images were obtained for documentation of the patent right internal jugular vein. A guide wire was inserted followed by a transitional dilator. This allowed insertion of a guide wire and catheter into the IVC. Measurements were obtained from the SVC / RA junction back to the right IJ venotomy site. In the right infraclavicular chest, a subcutaneous pocket was created over the second anterior rib. This was done under sterile conditions and local anesthesia. 1% lidocaine with epinephrine was utilized for this. A 2.5 cm incision was made in the skin. Blunt dissection was performed to create a subcutaneous pocket over the right pectoralis major muscle. The pocket was flushed with saline vigorously. There was adequate hemostasis. The port catheter was assembled and checked for leakage. The port catheter was secured in the pocket with two retention sutures. The tubing was tunneled subcutaneously to the right venotomy site and inserted into the SVC/RA junction through a valved peel-away sheath. Position was confirmed with fluoroscopy. Images were obtained for documentation. The patient tolerated the procedure well. No immediate complications. Incisions were closed in a two layer fashion with 4 - 0 Vicryl suture. Dermabond was applied to the skin. The port catheter was accessed, blood was aspirated followed by saline and heparin flushes. Needle was removed. A dry sterile dressing was applied. IMPRESSION: Ultrasound and fluoroscopically guided right internal jugular single lumen power port catheter insertion. Tip in the SVC/RA junction. Catheter ready for use. Electronically Signed   By: Jerilynn Mages.  Shick M.D.   On: 12/31/2019 15:19   VAS [place] LOWER EXTREMITY VENOUS (DVT) (ONLY [facility] &  [facility])  Result Date: 01/18/2020  Lower Venous DVTStudy Indications: Swelling, and Edema.  Comparison Study: no prior Performing Technologist: Abram Sander RVS  Examination Guidelines: A complete evaluation includes B-mode imaging, spectral Doppler, color Doppler, and power Doppler as needed of all accessible portions of each vessel. Bilateral testing is considered an integral part of a complete examination. Limited examinations for reoccurring indications may be performed as noted. The reflux portion of the exam is performed with the patient in reverse Trendelenburg.  +---------+---------------+---------+-----------+----------+--------------+ RIGHT    CompressibilityPhasicitySpontaneityPropertiesThrombus Aging +---------+---------------+---------+-----------+----------+--------------+ CFV      Full           Yes      Yes                                 +---------+---------------+---------+-----------+----------+--------------+  SFJ      Full                                                        +---------+---------------+---------+-----------+----------+--------------+ FV Prox  Full                                                        +---------+---------------+---------+-----------+----------+--------------+ FV Mid   Full                                                        +---------+---------------+---------+-----------+----------+--------------+ FV DistalFull                                                        +---------+---------------+---------+-----------+----------+--------------+ PFV      Full                                                        +---------+---------------+---------+-----------+----------+--------------+ POP      Full           Yes      Yes                                 +---------+---------------+---------+-----------+----------+--------------+ PTV      Full                                                         +---------+---------------+---------+-----------+----------+--------------+ PERO                                                  Not visualized +---------+---------------+---------+-----------+----------+--------------+   +---------+---------------+---------+-----------+----------+--------------+ LEFT     CompressibilityPhasicitySpontaneityPropertiesThrombus Aging +---------+---------------+---------+-----------+----------+--------------+ CFV      Full           Yes      Yes                                 +---------+---------------+---------+-----------+----------+--------------+ SFJ      Full                                                        +---------+---------------+---------+-----------+----------+--------------+  FV Prox  Full                                                        +---------+---------------+---------+-----------+----------+--------------+ FV Mid   Full                                                        +---------+---------------+---------+-----------+----------+--------------+ FV DistalFull                                                        +---------+---------------+---------+-----------+----------+--------------+ PFV      Full                                                        +---------+---------------+---------+-----------+----------+--------------+ POP      Full           Yes      Yes                                 +---------+---------------+---------+-----------+----------+--------------+ PTV      Full                                                        +---------+---------------+---------+-----------+----------+--------------+ PERO                                                  Not visualized +---------+---------------+---------+-----------+----------+--------------+     Summary: BILATERAL: - No evidence of deep vein thrombosis seen in the lower extremities, bilaterally.   *See table(s)  above for measurements and observations. Electronically signed by Jiovany Barban MD on 01/18/2020 at 40:04:52 PM.    Final     ASSESSMENT AND PLAN: This is a very pleasant 64 years old African-American male recently diagnosed with a stage IV non-small cell lung cancer, adenocarcinoma with no actionable mutations presented with large right lower lobe lung mass in addition to mediastinal and right supraclavicular lymphadenopathy as well as bilateral pulmonary nodules diagnosed in February 2021. I explained to the patient that he has incurable condition and all the treatment options will be of palliative nature. The patient has no actionable mutations on the recent molecular studies and he is not a candidate for the [facility] clinical trial. He started systemic chemotherapy with carboplatin, Alimta and Keytruda status post 1 cycle.  He tolerated the first cycle of his treatment well with no concerning adverse effects. I recommended for him to proceed with cycle #2 today as planned. He  will come back for follow-up visit in 3 weeks for evaluation before starting cycle #3.  The patient is still thinking about moving to [place] and he probably will receive cycle #4 at the [place] facility in [place]. For the weakness of the lower extremities, he will continue his physical therapy at the [place] facility. He was advised to call immediately if he has any concerning symptoms in the interval. The patient voices understanding of current disease status and treatment options and is in agreement with the current care plan.  All questions were answered. The patient knows to call the clinic with any problems, questions or concerns. We can certainly see the patient much sooner if necessary.  Disclaimer: This note was dictated with voice recognition software. Similar sounding words can inadvertently be transcribed and may not be corrected upon review.

## 2020-01-22 NOTE — Telephone Encounter (Signed)
Can he eat and drink today? -I told him yes.

## 2020-01-27 ENCOUNTER — Telehealth: Payer: Self-pay | Admitting: *Deleted

## 2020-01-27 ENCOUNTER — Other Ambulatory Visit: Payer: Self-pay | Admitting: Emergency Medicine

## 2020-01-27 DIAGNOSIS — C3491 Malignant neoplasm of unspecified part of right bronchus or lung: Secondary | ICD-10-CM

## 2020-01-27 NOTE — Progress Notes (Signed)
[facility] Progress Note   Kevin Lambert [id number] Apr 12, 1956 64 y.o.  Kevin Lambert. is managed by Dr. Fanny Bien. Mohamed  Actively treated with chemotherapy/immunotherapy/hormonal therapy: yes  Current therapy: carboplatin, Alimta and Keytruda   Last treated: 01/22/2020 (cycle #2)  Next scheduled appointment with provider: 02/12/2020  Assessment: Plan:    Non-small cell cancer of right lung ([name]) - Plan: sodium chloride 0.9 % 1,000 mL with magnesium sulfate 4 g infusion, heparin lock flush 100 unit/mL, sodium chloride flush (NS) 0.9 % injection 10 mL  Anorexia  Other fatigue  Dehydration  Hypomagnesemia - Plan: DISCONTINUED: magnesium sulfate IVPB 4 g 100 mL  Port-A-Cath in place - Plan: heparin lock flush 100 unit/mL, sodium chloride flush (NS) 0.9 % injection 10 mL  Neuropathy  Mucositis   Stage IV (T2b, N3, M1a) non-small cell lung cancer, adenocarcinoma: [name] is status post cycle 2 of carboplatin, Alimta and Keytruda.  He continues to be managed by Dr. Julien Nordmann and is scheduled to be seen in follow-up on 08/13/2020.  Anorexia, fatigue, and dehydration: The patient was given 1 L of normal saline IV today.  Hypomagnesemia: The patient was given 4 g of magnesium in 1 L of normal saline over 2 hours today.  Neuropathy: The patient was given a prescription for Lyrica 25 mg once daily.  Mucositis: The patient was given a prescription for Magic mouthwash.  Please see After Visit Summary for patient specific instructions.  Future Appointments  Date Time Provider [place]  02/05/2020  9:30 AM [facility] LAB 5 [facility] None  02/05/2020  9:45 AM [facility] [place] FLUSH [facility] None  02/12/2020 11:15 AM [facility]/FLUSH [facility] None  02/12/2020 11:30 AM [facility] [place] FLUSH [facility] None  02/12/2020 12:00 PM Curt Bears, MD [facility] None  02/12/2020  1:00 PM [facility] INFUSION [facility] None  02/19/2020  9:30 AM [facility] LAB  2 [facility] None  02/19/2020  9:45 AM [facility] [place] FLUSH [facility] None  02/26/2020  9:30 AM [facility] LAB 3 [facility] None  02/26/2020  9:45 AM [facility] [place] FLUSH [facility] None  03/04/2020 10:45 AM [facility] LAB 1 [facility] None  03/04/2020 11:00 AM [facility] [place] FLUSH [facility] None  03/04/2020 11:30 AM Heilingoetter, Cassandra L, PA-C [facility] None  03/04/2020 12:30 PM [facility] INFUSION [facility] None  04/28/2020 10:00 AM [facility] None  05/01/2020  9:00 AM [facility] PFT ROOM [facility] None  05/01/2020 10:00 AM Mack, Vinson Moselle, NP [facility] None    No orders of the defined types were placed in this encounter.      Subjective:   Patient ID:  Kevin Lambert. is a 64 y.o. (DOB August 18, 1956) male.  Chief Complaint: No chief complaint on file.   HPI Kevin Lambert.  is a 64 y.o. male with a diagnosis of a stage IV (T2b, N3, M1a) non-small cell lung cancer, adenocarcinoma.  He is status post cycle 2 of carboplatin, Alimta and Keytruda.  He continues to be followed by Dr. Julien Nordmann.  He contacted our office yesterday stating that he was not eating or drinking and "cannot gain back my strength" after his treatment last week.  He has been waiting for a glipizide prescription to be delivered and has not taken any of this for "about a week".  He also reports having fatigue, anorexia, and dehydration.  He reports ongoing peripheral neuropathy.  He was previously placed on Neurontin.  Causes him to have unsteady gait.  He also reports having sores in his mouth and on his  lips.  Medications: I have reviewed the patient's current medications.  Allergies:  Allergies  Allergen Reactions  . Metformin And Related Other (See Comments)    Severe muscle spasms    Past Medical History:  Diagnosis Date  . Arthritis   . Depression   . DM (diabetes mellitus) ([place])    type II  . Dyslipidemia   . Dyspnea    unable to walk much - he thinks it is from   . GERD  (gastroesophageal reflux disease)   . Hepatitis    Hepatitis C- treated  . History of kidney stones   . HTN (hypertension)   . Neuropathy   . OSA on CPAP   . PTSD (post-traumatic stress disorder)     Past Surgical History:  Procedure Laterality Date  . BIOPSY OF MEDIASTINAL MASS  12/10/2019   Procedure: BIOPSY OF MEDIASTINAL MASS;  Surgeon: Garner Nash, DO;  Location: [facility];  Service: Pulmonary;;  right upper lobe  . BRONCHIAL NEEDLE ASPIRATION BIOPSY  12/10/2019   Procedure: BRONCHIAL NEEDLE ASPIRATION BIOPSIES;  Surgeon: Garner Nash, DO;  Location: [facility];  Service: Pulmonary;;  . COLONOSCOPY    . IR IMAGING GUIDED PORT INSERTION  12/31/2019  . kidney stone    . URETERAL STENT PLACEMENT    . Ureteral Stent Removed    . VIDEO BRONCHOSCOPY WITH ENDOBRONCHIAL ULTRASOUND N/A 12/10/2019   Procedure: VIDEO BRONCHOSCOPY WITH ENDOBRONCHIAL ULTRASOUND;  Surgeon: Garner Nash, DO;  Location: [facility];  Service: Pulmonary;  Laterality: N/A;    Family History  Problem Relation Age of Onset  . Liver disease Mother   . Diabetes Father     Social History   Socioeconomic History  . Marital status: Single    Spouse name: Not on file  . Number of children: Not on file  . Years of education: Not on file  . Highest education level: Not on file  Occupational History  . Not on file  Tobacco Use  . Smoking status: Current Every Day Smoker    Packs/day: 0.60    Years: 50.00    Pack years: 30.00    Types: Cigarettes  . Smokeless tobacco: Never Used  . Tobacco comment: Pt. stated he is still trying to quit.  Substance and Sexual Activity  . Alcohol use: Yes    Alcohol/week: 6.0 standard drinks    Types: 6 Cans of beer per week  . Drug use: Not Currently  . Sexual activity: Not on file  Other Topics Concern  . Not on file  Social History Narrative  . Not on file   Social Determinants of Health   Financial Resource Strain:   . Difficulty of Paying Living  Expenses:   Food Insecurity:   . Worried About Charity fundraiser in the Last Year:   . Arboriculturist in the Last Year:   Transportation Needs:   . Film/video editor (Medical):   Marland Kitchen Lack of Transportation (Non-Medical):   Physical Activity:   . Days of Exercise per Week:   . Minutes of Exercise per Session:   Stress:   . Feeling of Stress :   Social Connections:   . Frequency of Communication with Friends and Family:   . Frequency of Social Gatherings with Friends and Family:   . Attends Religious Services:   . Active Member of Clubs or Organizations:   . Attends Archivist Meetings:   Marland Kitchen Marital Status:   Intimate Production manager  Violence:   . Fear of Current or Ex-Partner:   . Emotionally Abused:   Marland Kitchen Physically Abused:   . Sexually Abused:     Past Medical History, Surgical history, Social history, and Family history were reviewed and updated as appropriate.   Please see review of systems for further details on the patient's review from today.   Review of Systems:  Review of Systems  Constitutional: Positive for appetite change and fatigue. Negative for chills, diaphoresis and fever.  HENT: Positive for mouth sores. Negative for trouble swallowing.   Respiratory: Negative for cough, chest tightness and shortness of breath.   Cardiovascular: Negative for chest pain and palpitations.  Gastrointestinal: Negative for abdominal pain, constipation, diarrhea, nausea and vomiting.  Neurological: Positive for weakness and numbness. Negative for dizziness and headaches.    Objective:   Physical Exam:  BP (!) 159/71   Pulse 82   Temp 98.5 F (36.9 C) (Oral)   Resp 20   Ht 5\' 9"  (1.753 m)   Wt 236 lb 11.2 oz (107.4 kg)   SpO2 99%   BMI 34.95 kg/m  ECOG: 1  Physical Exam Constitutional:      General: He is not in acute distress.    Appearance: He is not diaphoretic.  HENT:     Head: Normocephalic and atraumatic.     Mouth/Throat:     Mouth: Mucous membranes  are moist.     Pharynx: No oropharyngeal exudate or posterior oropharyngeal erythema.     Comments: There are several small ulcerations on the patient's lips. Eyes:     General: No scleral icterus.       Right eye: No discharge.        Left eye: No discharge.  Cardiovascular:     Rate and Rhythm: Normal rate and regular rhythm.     Heart sounds: Normal heart sounds. No murmur. No friction rub. No gallop.   Pulmonary:     Effort: Pulmonary effort is normal. No respiratory distress.     Breath sounds: Normal breath sounds. No wheezing or rales.  Abdominal:     General: There is no distension.     Tenderness: There is no abdominal tenderness. There is no guarding or rebound.  Musculoskeletal:     Right lower leg: No edema.     Left lower leg: No edema.  Skin:    General: Skin is warm and dry.     Findings: No erythema or rash.  Neurological:     Mental Status: He is alert.     Coordination: Coordination normal.     Gait: Gait normal.  Psychiatric:        Mood and Affect: Mood normal.        Behavior: Behavior normal.        Thought Content: Thought content normal.        Judgment: Judgment normal.     Lab Review:     Component Value Date/Time   NA 129 (L) 01/28/2020 1243   K 4.5 01/28/2020 1243   CL 93 (L) 01/28/2020 1243   CO2 26 01/28/2020 1243   GLUCOSE 316 (H) 01/28/2020 1243   BUN 17 01/28/2020 1243   CREATININE 1.04 01/28/2020 1243   CALCIUM 10.0 01/28/2020 1243   PROT 6.5 01/28/2020 1243   ALBUMIN 3.6 01/28/2020 1243   AST 28 01/28/2020 1243   ALT 62 (H) 01/28/2020 1243   ALKPHOS 93 01/28/2020 1243   BILITOT 0.4 01/28/2020 1243   GFRNONAA >60 01/28/2020  1243   GFRAA >60 01/28/2020 1243       Component Value Date/Time   WBC 2.8 (L) 01/28/2020 1243   WBC 5.8 01/18/2020 1443   RBC 4.26 01/28/2020 1243   HGB 13.5 01/28/2020 1243   HCT 38.1 (L) 01/28/2020 1243   PLT 190 01/28/2020 1243   MCV 89.4 01/28/2020 1243   MCH 31.7 01/28/2020 1243   MCHC 35.4  01/28/2020 1243   RDW 13.6 01/28/2020 1243   LYMPHSABS 1.4 01/28/2020 1243   MONOABS 0.1 01/28/2020 1243   EOSABS 0.1 01/28/2020 1243   BASOSABS 0.0 01/28/2020 1243   -------------------------------  Imaging from last 24 hours (if applicable):  Radiology interpretation: IR IMAGING GUIDED PORT INSERTION  Result Date: 12/31/2019 CLINICAL DATA:  Non-small cell lung cancer EXAM: RIGHT INTERNAL JUGULAR SINGLE LUMEN POWER PORT CATHETER INSERTION Date:  12/31/2019 12/31/2019 3:13 pm Radiologist:  Jerilynn Mages. Daryll Brod, MD Guidance:  Ultrasound fluoroscopic MEDICATIONS: Ancef 2 g; The antibiotic was administered within an appropriate time interval prior to skin puncture. ANESTHESIA/SEDATION: Versed 1.5 mg IV; Fentanyl 100 mcg IV; Moderate Sedation Time:  19 minutes The patient was continuously monitored during the procedure by the interventional radiology nurse under my direct supervision. FLUOROSCOPY TIME:  0 minutes, 42 seconds (25 mGy) COMPLICATIONS: None immediate. CONTRAST:  None PROCEDURE: Informed consent was obtained from the patient following explanation of the procedure, risks, benefits and alternatives. The patient understands, agrees and consents for the procedure. All questions were addressed. A time out was performed. Maximal barrier sterile technique utilized including caps, mask, sterile gowns, sterile gloves, large sterile drape, hand hygiene, and 2% chlorhexidine scrub. Under sterile conditions and local anesthesia, right internal jugular micropuncture venous access was performed. Access was performed with ultrasound. Images were obtained for documentation of the patent right internal jugular vein. A guide wire was inserted followed by a transitional dilator. This allowed insertion of a guide wire and catheter into the IVC. Measurements were obtained from the SVC / RA junction back to the right IJ venotomy site. In the right infraclavicular chest, a subcutaneous pocket was created over the second  anterior rib. This was done under sterile conditions and local anesthesia. 1% lidocaine with epinephrine was utilized for this. A 2.5 cm incision was made in the skin. Blunt dissection was performed to create a subcutaneous pocket over the right pectoralis major muscle. The pocket was flushed with saline vigorously. There was adequate hemostasis. The port catheter was assembled and checked for leakage. The port catheter was secured in the pocket with two retention sutures. The tubing was tunneled subcutaneously to the right venotomy site and inserted into the SVC/RA junction through a valved peel-away sheath. Position was confirmed with fluoroscopy. Images were obtained for documentation. The patient tolerated the procedure well. No immediate complications. Incisions were closed in a two layer fashion with 4 - 0 Vicryl suture. Dermabond was applied to the skin. The port catheter was accessed, blood was aspirated followed by saline and heparin flushes. Needle was removed. A dry sterile dressing was applied. IMPRESSION: Ultrasound and fluoroscopically guided right internal jugular single lumen power port catheter insertion. Tip in the SVC/RA junction. Catheter ready for use. Electronically Signed   By: Jerilynn Mages.  Shick M.D.   On: 12/31/2019 15:19   VAS [place] LOWER EXTREMITY VENOUS (DVT) (ONLY [facility])  Result Date: 01/18/2020  Lower Venous DVTStudy Indications: Swelling, and Edema.  Comparison Study: no prior Performing Technologist: Abram Sander RVS  Examination Guidelines: A complete evaluation includes B-mode imaging,  spectral Doppler, color Doppler, and power Doppler as needed of all accessible portions of each vessel. Bilateral testing is considered an integral part of a complete examination. Limited examinations for reoccurring indications may be performed as noted. The reflux portion of the exam is performed with the patient in reverse Trendelenburg.   +---------+---------------+---------+-----------+----------+--------------+ RIGHT    CompressibilityPhasicitySpontaneityPropertiesThrombus Aging +---------+---------------+---------+-----------+----------+--------------+ CFV      Full           Yes      Yes                                 +---------+---------------+---------+-----------+----------+--------------+ SFJ      Full                                                        +---------+---------------+---------+-----------+----------+--------------+ FV Prox  Full                                                        +---------+---------------+---------+-----------+----------+--------------+ FV Mid   Full                                                        +---------+---------------+---------+-----------+----------+--------------+ FV DistalFull                                                        +---------+---------------+---------+-----------+----------+--------------+ PFV      Full                                                        +---------+---------------+---------+-----------+----------+--------------+ POP      Full           Yes      Yes                                 +---------+---------------+---------+-----------+----------+--------------+ PTV      Full                                                        +---------+---------------+---------+-----------+----------+--------------+ PERO                                                  Not visualized +---------+---------------+---------+-----------+----------+--------------+   +---------+---------------+---------+-----------+----------+--------------+ LEFT  CompressibilityPhasicitySpontaneityPropertiesThrombus Aging +---------+---------------+---------+-----------+----------+--------------+ CFV      Full           Yes      Yes                                  +---------+---------------+---------+-----------+----------+--------------+ SFJ      Full                                                        +---------+---------------+---------+-----------+----------+--------------+ FV Prox  Full                                                        +---------+---------------+---------+-----------+----------+--------------+ FV Mid   Full                                                        +---------+---------------+---------+-----------+----------+--------------+ FV DistalFull                                                        +---------+---------------+---------+-----------+----------+--------------+ PFV      Full                                                        +---------+---------------+---------+-----------+----------+--------------+ POP      Full           Yes      Yes                                 +---------+---------------+---------+-----------+----------+--------------+ PTV      Full                                                        +---------+---------------+---------+-----------+----------+--------------+ PERO                                                  Not visualized +---------+---------------+---------+-----------+----------+--------------+     Summary: BILATERAL: - No evidence of deep vein thrombosis seen in the lower extremities, bilaterally.   *See table(s) above for measurements and observations. Electronically signed by Orva Barban MD on 01/18/2020 at 50:04:52 PM.    Final         This case was discussed with Dr. Julien Nordmann. He expressed agreement  with my management of this patient.

## 2020-01-28 ENCOUNTER — Telehealth: Payer: Self-pay

## 2020-01-28 ENCOUNTER — Inpatient Hospital Stay: Payer: No Typology Code available for payment source

## 2020-01-28 ENCOUNTER — Other Ambulatory Visit: Payer: Self-pay

## 2020-01-28 ENCOUNTER — Inpatient Hospital Stay (HOSPITAL_BASED_OUTPATIENT_CLINIC_OR_DEPARTMENT_OTHER): Payer: No Typology Code available for payment source | Admitting: Medical

## 2020-01-28 VITALS — BP 159/71 | HR 82 | Temp 98.5°F | Resp 20 | Ht 69.0 in | Wt 236.7 lb

## 2020-01-28 DIAGNOSIS — R630 Anorexia: Secondary | ICD-10-CM

## 2020-01-28 DIAGNOSIS — R5383 Other fatigue: Secondary | ICD-10-CM | POA: Diagnosis not present

## 2020-01-28 DIAGNOSIS — C3491 Malignant neoplasm of unspecified part of right bronchus or lung: Secondary | ICD-10-CM

## 2020-01-28 DIAGNOSIS — Z95828 Presence of other vascular implants and grafts: Secondary | ICD-10-CM

## 2020-01-28 DIAGNOSIS — E860 Dehydration: Secondary | ICD-10-CM

## 2020-01-28 DIAGNOSIS — G629 Polyneuropathy, unspecified: Secondary | ICD-10-CM

## 2020-01-28 DIAGNOSIS — K1230 Oral mucositis (ulcerative), unspecified: Secondary | ICD-10-CM

## 2020-01-28 DIAGNOSIS — Z5112 Encounter for antineoplastic immunotherapy: Secondary | ICD-10-CM | POA: Diagnosis not present

## 2020-01-28 LAB — CMP (CANCER CENTER ONLY)
ALT: 62 U/L — ABNORMAL HIGH (ref 0–44)
AST: 28 U/L (ref 15–41)
Albumin: 3.6 g/dL (ref 3.5–5.0)
Alkaline Phosphatase: 93 U/L (ref 38–126)
Anion gap: 10 (ref 5–15)
BUN: 17 mg/dL (ref 8–23)
CO2: 26 mmol/L (ref 22–32)
Calcium: 10 mg/dL (ref 8.9–10.3)
Chloride: 93 mmol/L — ABNORMAL LOW (ref 98–111)
Creatinine: 1.04 mg/dL (ref 0.61–1.24)
GFR, Est AFR Am: >60 mL/min (ref >60)
GFR, Estimated: >60 mL/min (ref >60)
Glucose, Bld: 316 mg/dL — ABNORMAL HIGH (ref 70–99)
Potassium: 4.5 mmol/L (ref 3.5–5.1)
Sodium: 129 mmol/L — ABNORMAL LOW (ref 135–145)
Total Bilirubin: 0.4 mg/dL (ref 0.3–1.2)
Total Protein: 6.5 g/dL (ref 6.5–8.1)

## 2020-01-28 LAB — MAGNESIUM: Magnesium: 1.5 mg/dL — ABNORMAL LOW (ref 1.7–2.4)

## 2020-01-28 LAB — CBC WITH DIFFERENTIAL (CANCER CENTER ONLY)
Abs Immature Granulocytes: 0.01 10*3/uL (ref 0.00–0.07)
Basophils Absolute: 0 10*3/uL (ref 0.0–0.1)
Basophils Relative: 0 %
Eosinophils Absolute: 0.1 10*3/uL (ref 0.0–0.5)
Eosinophils Relative: 3 %
HCT: 38.1 % — ABNORMAL LOW (ref 39.0–52.0)
Hemoglobin: 13.5 g/dL (ref 13.0–17.0)
Immature Granulocytes: 0 %
Lymphocytes Relative: 49 %
Lymphs Abs: 1.4 10*3/uL (ref 0.7–4.0)
MCH: 31.7 pg (ref 26.0–34.0)
MCHC: 35.4 g/dL (ref 30.0–36.0)
MCV: 89.4 fL (ref 80.0–100.0)
Monocytes Absolute: 0.1 10*3/uL (ref 0.1–1.0)
Monocytes Relative: 4 %
Neutro Abs: 1.2 10*3/uL — ABNORMAL LOW (ref 1.7–7.7)
Neutrophils Relative %: 44 %
Platelet Count: 190 10*3/uL (ref 150–400)
RBC: 4.26 MIL/uL (ref 4.22–5.81)
RDW: 13.6 % (ref 11.5–15.5)
WBC Count: 2.8 10*3/uL — ABNORMAL LOW (ref 4.0–10.5)
nRBC: 0 % (ref 0.0–0.2)

## 2020-01-28 LAB — SAMPLE TO BLOOD BANK

## 2020-01-28 MED ORDER — SODIUM CHLORIDE 0.9 % IV SOLN
Freq: Once | INTRAVENOUS | Status: AC
  Filled 2020-01-28: qty 1000

## 2020-01-28 MED ORDER — PREGABALIN 25 MG PO CAPS: 25.0000 mg | ORAL_CAPSULE | Freq: Every day | ORAL | 1 refills | Status: DC

## 2020-01-28 MED ORDER — SODIUM CHLORIDE 0.9% FLUSH
10.0000 mL | Freq: Once | INTRAVENOUS | Status: AC
  Administered 2020-01-28: 10 mL
  Filled 2020-01-28: qty 10

## 2020-01-28 MED ORDER — HEPARIN SOD (PORK) LOCK FLUSH 100 UNIT/ML IV SOLN
500.0000 [IU] | Freq: Once | INTRAVENOUS | Status: AC
  Administered 2020-01-28: 500 [IU]
  Filled 2020-01-28: qty 5

## 2020-01-28 MED ORDER — MAGIC MOUTHWASH: 5.0000 mL | Freq: Four times a day (QID) | ORAL | 2 refills | Status: DC | PRN

## 2020-01-28 MED ORDER — MAGNESIUM SULFATE 4 GM/100ML IV SOLN
4.0000 g | Freq: Once | INTRAVENOUS | Status: DC
  Filled 2020-01-28: qty 100

## 2020-01-28 NOTE — Patient Instructions (Signed)
Rehydration, Adult Rehydration is the replacement of body fluids and salts and minerals (electrolytes) that are lost during dehydration. Dehydration is when there is not enough fluid or water in the body. This happens when you lose more fluids than you take in. Common causes of dehydration include:  Vomiting.  Diarrhea.  Excessive sweating, such as from heat exposure or exercise.  Taking medicines that cause the body to lose excess fluid (diuretics).  Impaired kidney function.  Not drinking enough fluid.  Certain illnesses or infections.  Certain poorly controlled long-term (chronic) illnesses, such as diabetes, heart disease, and kidney disease.  Symptoms of mild dehydration may include thirst, dry lips and mouth, dry skin, and dizziness. Symptoms of severe dehydration may include increased heart rate, confusion, fainting, and not urinating. You can rehydrate by drinking certain fluids or getting fluids through an IV tube, as told by your health care provider. What are the risks? Generally, rehydration is safe. However, one problem that can happen is taking in too much fluid (overhydration). This is rare. If overhydration happens, it can cause an electrolyte imbalance, kidney failure, or a decrease in salt (sodium) levels in the body. How to rehydrate Follow instructions from your health care provider for rehydration. The kind of fluid you should drink and the amount you should drink depend on your condition.  If directed by your health care provider, drink an oral rehydration solution (ORS). This is a drink designed to treat dehydration that is found in pharmacies and retail stores. ? Make an ORS by following instructions on the package. ? Start by drinking small amounts, about  cup (120 mL) every 5-10 minutes. ? Slowly increase how much you drink until you have taken the amount recommended by your health care provider.  Drink enough clear fluids to keep your urine clear or pale  yellow. If you were instructed to drink an ORS, finish the ORS first, then start slowly drinking other clear fluids. Drink fluids such as: ? Water. Do not drink only water. Doing that can lead to having too little sodium in your body (hyponatremia). ? Ice chips. ? Fruit juice that you have added water to (diluted juice). ? Low-calorie sports drinks.  If you are severely dehydrated, your health care provider may recommend that you receive fluids through an IV tube in the hospital.  Do not take sodium tablets. Doing that can lead to the condition of having too much sodium in your body (hypernatremia). Eating while you rehydrate Follow instructions from your health care provider about what to eat while you rehydrate. Your health care provider may recommend that you slowly begin eating regular foods in small amounts.  Eat foods that contain a healthy balance of electrolytes, such as bananas, oranges, potatoes, tomatoes, and spinach.  Avoid foods that are greasy or contain a lot of fat or sugar.  In some cases, you may get nutrition through a feeding tube that is passed through your nose and into your stomach (nasogastric tube, or NG tube). This may be done if you have uncontrolled vomiting or diarrhea. Beverages to avoid Certain beverages may make dehydration worse. While you rehydrate, avoid:  Alcohol.  Caffeine.  Drinks that contain a lot of sugar. These include: ? High-calorie sports drinks. ? Fruit juice that is not diluted. ? Soda.  Check nutrition labels to see how much sugar or caffeine a beverage contains. Signs of dehydration recovery You may be recovering from dehydration if:  You are urinating more often than before you started  rehydrating.  Your urine is clear or pale yellow.  Your energy level improves.  You vomit less frequently.  You have diarrhea less frequently.  Your appetite improves or returns to normal.  You feel less dizzy or less light-headed.  Your  skin tone and color start to look more normal. Contact a health care provider if:  You continue to have symptoms of mild dehydration, such as: ? Thirst. ? Dry lips. ? Slightly dry mouth. ? Dry, warm skin. ? Dizziness.  You continue to vomit or have diarrhea. Get help right away if:  You have symptoms of dehydration that get worse.  You feel: ? Confused. ? Weak. ? Like you are going to faint.  You have not urinated in 6-8 hours.  You have very dark urine.  You have trouble breathing.  Your heart rate while sitting still is over 100 beats a minute.  You cannot drink fluids without vomiting.  You have vomiting or diarrhea that: ? Gets worse. ? Does not go away.  You have a fever. This information is not intended to replace advice given to you by your health care provider. Make sure you discuss any questions you have with your health care provider. Document Revised: 10/06/2017 Document Reviewed: 12/18/2015 Elsevier Patient Education  [place]. Hypomagnesemia Hypomagnesemia is a condition in which the level of magnesium in the blood is low. Magnesium is a mineral that is found in many foods. It is used in many different processes in the body. Hypomagnesemia can affect every organ in the body. In severe cases, it can cause life-threatening problems. What are the causes? This condition may be caused by:  Not getting enough magnesium in your diet.  Malnutrition.  Problems with absorbing magnesium from the intestines.  Dehydration.  Alcohol abuse.  Vomiting.  Severe or chronic diarrhea.  Some medicines, including medicines that make you urinate more (diuretics).  Certain diseases, such as kidney disease, diabetes, celiac disease, and overactive thyroid. What are the signs or symptoms? Symptoms of this condition include:  Loss of appetite.  Nausea and vomiting.  Involuntary shaking or trembling of a body part (tremor).  Muscle weakness.  Tingling  in the arms and legs.  Sudden tightening of muscles (muscle spasms).  Confusion.  Psychiatric issues, such as depression, irritability, or psychosis.  A feeling of fluttering of the heart.  Seizures. These symptoms are more severe if magnesium levels drop suddenly. How is this diagnosed? This condition may be diagnosed based on:  Your symptoms and medical history.  A physical exam.  Blood and urine tests. How is this treated? Treatment depends on the cause and the severity of the condition. It may be treated with:  A magnesium supplement. This can be taken in pill form. If the condition is severe, magnesium is usually given through an IV.  Changes to your diet. You may be directed to eat foods that have a lot of magnesium, such as green leafy vegetables, peas, beans, and nuts.  Stopping any intake of alcohol. Follow these instructions at home:      Make sure that your diet includes foods with magnesium. Foods that have a lot of magnesium in them include: ? Green leafy vegetables, such as spinach and broccoli. ? Beans and peas. ? Nuts and seeds, such as almonds and sunflower seeds. ? Whole grains, such as whole grain bread and fortified cereals.  Take magnesium supplements if your health care provider tells you to do that. Take them as directed.  Take over-the-counter and prescription medicines only as told by your health care provider.  Have your magnesium levels monitored as told by your health care provider.  When you are active, drink fluids that contain electrolytes.  Avoid drinking alcohol.  Keep all follow-up visits as told by your health care provider. This is important. Contact a health care provider if:  You get worse instead of better.  Your symptoms return. Get help right away if you:  Develop severe muscle weakness.  Have trouble breathing.  Feel that your heart is racing. Summary  Hypomagnesemia is a condition in which the level of  magnesium in the blood is low.  Hypomagnesemia can affect every organ in the body.  Treatment may include eating more foods that contain magnesium, taking magnesium supplements, and not drinking alcohol.  Have your magnesium levels monitored as told by your health care provider. This information is not intended to replace advice given to you by your health care provider. Make sure you discuss any questions you have with your health care provider. Document Revised: 10/06/2017 Document Reviewed: 09/25/2017 Elsevier Patient Education  2020 [employer].

## 2020-01-28 NOTE — Telephone Encounter (Signed)
Pt. here today for dehydration, anorexia, and fatigue. Pt states he also has sores on the left side of his lip. Lucianne Lei informed. Pt's blood work showed Pt's magnesium was 1.5 per Lucianne Lei Pt to receive 1 liter of NS with 4g of magnesium. Pt's port accessed. IV fluids given over 2 hours. Pt tolerated fluids well. Port de accessed and flushed with blood return noted needle in tact band-aid placed. Pt .discharged from symptom management.

## 2020-01-29 ENCOUNTER — Encounter: Payer: Self-pay | Admitting: *Deleted

## 2020-01-29 ENCOUNTER — Telehealth: Payer: Self-pay | Admitting: Medical Oncology

## 2020-01-29 ENCOUNTER — Other Ambulatory Visit: Payer: Non-veteran care

## 2020-01-29 NOTE — Progress Notes (Signed)
Oncology Nurse Navigator Documentation  Oncology Nurse Navigator Flowsheets 01/29/2020  Navigator Location [facility]  Navigator Encounter Type Other/I followed up on [facility] one results.  I called [facility] one due to there is a hold on completion of test.  I clarified questions and they will continue processing test  Treatment Phase Treatment  Barriers/Navigation Needs Coordination of Care  Interventions Coordination of Care  Acuity Level 2-Minimal Needs (1-2 Barriers Identified)  Coordination of Care -  Time Spent with Patient 45

## 2020-01-29 NOTE — Telephone Encounter (Signed)
Received FMLA paperwork that was illegible . I found the fax number and faxed a request to [facility] to refax a legible copy or email to me .

## 2020-01-30 NOTE — Telephone Encounter (Signed)
°   Kevin Lambert. DOB: 15-Aug-1956 MRN: [id number]   RIDER WAIVER AND RELEASE OF LIABILITY  For purposes of improving physical access to our facilities, [facility] is pleased to partner with third parties to provide [facility] patients or other authorized individuals the option of convenient, on-demand ground transportation services (the [employer]) through use of the technology service that enables users to request on-demand ground transportation from independent third-party providers.  By opting to use and accept these [employer], I, the undersigned, hereby agree on behalf of myself, and on behalf of any minor child using the [employer] for whom I am the parent or legal guardian, as follows:  1. Government social research officer provided to me are provided by independent third-party transportation providers who are not [employer] or employees and who are unaffiliated with [employer]. 2. [facility] is neither a transportation carrier nor a common or public carrier. 3. [facility] has no control over the quality or safety of the transportation that occurs as a result of the [employer]. 4. [facility] cannot guarantee that any third-party transportation provider will complete any arranged transportation service. 5. [facility] makes no representation, warranty, or guarantee regarding the reliability, timeliness, quality, safety, suitability, or availability of any of the Transport Services or that they will be error free. 6. I fully understand that traveling by vehicle involves risks and dangers of serious bodily injury, including permanent disability, paralysis, and death. I agree, on behalf of myself and on behalf of any minor child using the Transport Services for whom I am the parent or legal guardian, that the entire risk arising out of my use of the [employer] remains solely with me, to the maximum extent permitted under applicable law. 7. The [employer] are provided as is and as available. [facility] disclaims all representations and warranties, express, implied or statutory, not expressly set out in these terms, including the implied warranties of merchantability and fitness for a particular purpose. 8. I hereby waive and release [place], its agents, employees, officers, directors, representatives, insurers, attorneys, assigns, successors, subsidiaries, and affiliates from any and all past, present, or future claims, demands, liabilities, actions, causes of action, or suits of any kind directly or indirectly arising from acceptance and use of the [employer]. 9. I further waive and release [facility] and its affiliates from all present and future liability and responsibility for any injury or death to persons or damages to property caused by or related to the use of the [employer]. 10. I have read this Waiver and Release of Liability, and I understand the terms used in it and their legal significance. This Waiver is freely and voluntarily given with the understanding that my right (as well as the right of any minor child for whom I am the parent or legal guardian using the [employer]) to legal recourse against  in connection with the [employer] is knowingly surrendered in return for use of these services.   I attest that I read the consent document to Kevin Lambert., gave Mr. Gamino the opportunity to ask questions and answered the questions asked (if any). I affirm that Kevin Lambert. then provided consent for he's participation in this program.     Drucie Ip

## 2020-02-05 ENCOUNTER — Telehealth: Payer: Self-pay | Admitting: Internal Medicine

## 2020-02-05 ENCOUNTER — Other Ambulatory Visit: Payer: No Typology Code available for payment source

## 2020-02-05 ENCOUNTER — Inpatient Hospital Stay: Payer: No Typology Code available for payment source

## 2020-02-05 ENCOUNTER — Telehealth: Payer: Self-pay | Admitting: Medical Oncology

## 2020-02-05 NOTE — Telephone Encounter (Signed)
Pt requests move this am appt to later in day. Schedule message sent.

## 2020-02-05 NOTE — Telephone Encounter (Signed)
Scheduled appt per 3/31 sch message - unable to reach pt  - left message with appt date and time

## 2020-02-06 ENCOUNTER — Encounter (HOSPITAL_COMMUNITY): Payer: Self-pay | Admitting: Internal Medicine

## 2020-02-06 ENCOUNTER — Inpatient Hospital Stay: Payer: No Typology Code available for payment source | Attending: Internal Medicine

## 2020-02-06 ENCOUNTER — Telehealth: Payer: Self-pay | Admitting: *Deleted

## 2020-02-06 ENCOUNTER — Other Ambulatory Visit: Payer: Self-pay

## 2020-02-06 ENCOUNTER — Inpatient Hospital Stay: Payer: No Typology Code available for payment source

## 2020-02-06 DIAGNOSIS — Z452 Encounter for adjustment and management of vascular access device: Secondary | ICD-10-CM | POA: Diagnosis not present

## 2020-02-06 DIAGNOSIS — C3411 Malignant neoplasm of upper lobe, right bronchus or lung: Secondary | ICD-10-CM | POA: Diagnosis present

## 2020-02-06 DIAGNOSIS — Z79899 Other long term (current) drug therapy: Secondary | ICD-10-CM | POA: Diagnosis not present

## 2020-02-06 DIAGNOSIS — Z5111 Encounter for antineoplastic chemotherapy: Secondary | ICD-10-CM | POA: Insufficient documentation

## 2020-02-06 DIAGNOSIS — C3491 Malignant neoplasm of unspecified part of right bronchus or lung: Secondary | ICD-10-CM

## 2020-02-06 DIAGNOSIS — Z5112 Encounter for antineoplastic immunotherapy: Secondary | ICD-10-CM | POA: Diagnosis not present

## 2020-02-06 DIAGNOSIS — Z95828 Presence of other vascular implants and grafts: Secondary | ICD-10-CM

## 2020-02-06 LAB — CMP (CANCER CENTER ONLY)
ALT: 91 U/L — ABNORMAL HIGH (ref 0–44)
AST: 27 U/L (ref 15–41)
Albumin: 4 g/dL (ref 3.5–5.0)
Alkaline Phosphatase: 104 U/L (ref 38–126)
Anion gap: 16 — ABNORMAL HIGH (ref 5–15)
BUN: 19 mg/dL (ref 8–23)
CO2: 23 mmol/L (ref 22–32)
Calcium: 10.6 mg/dL — ABNORMAL HIGH (ref 8.9–10.3)
Chloride: 93 mmol/L — ABNORMAL LOW (ref 98–111)
Creatinine: 0.98 mg/dL (ref 0.61–1.24)
GFR, Est AFR Am: >60 mL/min (ref >60)
GFR, Estimated: >60 mL/min (ref >60)
Glucose, Bld: 334 mg/dL — ABNORMAL HIGH (ref 70–99)
Potassium: 4.2 mmol/L (ref 3.5–5.1)
Sodium: 132 mmol/L — ABNORMAL LOW (ref 135–145)
Total Bilirubin: <0.2 mg/dL — ABNORMAL LOW (ref 0.3–1.2)
Total Protein: 7.4 g/dL (ref 6.5–8.1)

## 2020-02-06 LAB — CBC WITH DIFFERENTIAL (CANCER CENTER ONLY)
Abs Immature Granulocytes: 0.11 10*3/uL — ABNORMAL HIGH (ref 0.00–0.07)
Basophils Absolute: 0 10*3/uL (ref 0.0–0.1)
Basophils Relative: 0 %
Eosinophils Absolute: 0.1 10*3/uL (ref 0.0–0.5)
Eosinophils Relative: 2 %
HCT: 37.9 % — ABNORMAL LOW (ref 39.0–52.0)
Hemoglobin: 13.2 g/dL (ref 13.0–17.0)
Immature Granulocytes: 2 %
Lymphocytes Relative: 35 %
Lymphs Abs: 2.2 10*3/uL (ref 0.7–4.0)
MCH: 31.4 pg (ref 26.0–34.0)
MCHC: 34.8 g/dL (ref 30.0–36.0)
MCV: 90.2 fL (ref 80.0–100.0)
Monocytes Absolute: 0.7 10*3/uL (ref 0.1–1.0)
Monocytes Relative: 11 %
Neutro Abs: 3.1 10*3/uL (ref 1.7–7.7)
Neutrophils Relative %: 50 %
Platelet Count: 151 10*3/uL (ref 150–400)
RBC: 4.2 MIL/uL — ABNORMAL LOW (ref 4.22–5.81)
RDW: 14.8 % (ref 11.5–15.5)
WBC Count: 6.3 10*3/uL (ref 4.0–10.5)
nRBC: 0 % (ref 0.0–0.2)

## 2020-02-06 MED ORDER — HEPARIN SOD (PORK) LOCK FLUSH 100 UNIT/ML IV SOLN
500.0000 [IU] | Freq: Once | INTRAVENOUS | Status: AC
  Administered 2020-02-06: 500 [IU]
  Filled 2020-02-06: qty 5

## 2020-02-06 MED ORDER — SODIUM CHLORIDE 0.9% FLUSH
10.0000 mL | Freq: Once | INTRAVENOUS | Status: AC
  Administered 2020-02-06: 10 mL
  Filled 2020-02-06: qty 10

## 2020-02-06 NOTE — Progress Notes (Signed)
Pharmacist Chemotherapy Monitoring - Follow Up Assessment    I verify that I have reviewed each item in the below checklist:  . Regimen for the patient is scheduled for the appropriate day and plan matches scheduled date. Marland Kitchen Appropriate non-routine labs are ordered dependent on drug ordered. - Yes, B12 due.  . If applicable, additional medications reviewed and ordered per protocol based on lifetime cumulative doses and/or treatment regimen.   Plan for follow-up and/or issues identified: No . I-vent associated with next due treatment: No . MD and/or nursing notified: No  Kevin Lambert 02/06/2020 7:55 AM

## 2020-02-06 NOTE — Telephone Encounter (Signed)
Left message with note below 

## 2020-02-06 NOTE — Telephone Encounter (Signed)
-----   Message from [employer], PA-C sent at 02/06/2020  2:26 PM EDT ----- Can you call him and tell him his blood sugar is high and to take his diabetes medications as prescribed and monitor his blood sugar closely at home.  ----- Message ----- From: Interface, Lab In [place] Sent: 02/06/2020   1:33 PM EDT To: Tobe Sos Heilingoetter, PA-C

## 2020-02-07 LAB — TSH: TSH: 0.924 u[IU]/mL (ref 0.320–4.118)

## 2020-02-12 ENCOUNTER — Inpatient Hospital Stay (HOSPITAL_BASED_OUTPATIENT_CLINIC_OR_DEPARTMENT_OTHER): Payer: No Typology Code available for payment source | Admitting: Internal Medicine

## 2020-02-12 ENCOUNTER — Inpatient Hospital Stay: Payer: No Typology Code available for payment source

## 2020-02-12 ENCOUNTER — Other Ambulatory Visit: Payer: Self-pay

## 2020-02-12 ENCOUNTER — Encounter: Payer: Self-pay | Admitting: Internal Medicine

## 2020-02-12 DIAGNOSIS — C3491 Malignant neoplasm of unspecified part of right bronchus or lung: Secondary | ICD-10-CM

## 2020-02-12 DIAGNOSIS — Z5112 Encounter for antineoplastic immunotherapy: Secondary | ICD-10-CM | POA: Diagnosis not present

## 2020-02-12 DIAGNOSIS — C3490 Malignant neoplasm of unspecified part of unspecified bronchus or lung: Secondary | ICD-10-CM | POA: Diagnosis not present

## 2020-02-12 DIAGNOSIS — Z95828 Presence of other vascular implants and grafts: Secondary | ICD-10-CM

## 2020-02-12 LAB — CMP (CANCER CENTER ONLY)
ALT: 104 U/L — ABNORMAL HIGH (ref 0–44)
AST: 57 U/L — ABNORMAL HIGH (ref 15–41)
Albumin: 3.7 g/dL (ref 3.5–5.0)
Alkaline Phosphatase: 102 U/L (ref 38–126)
Anion gap: 9 (ref 5–15)
BUN: 17 mg/dL (ref 8–23)
CO2: 26 mmol/L (ref 22–32)
Calcium: 10 mg/dL (ref 8.9–10.3)
Chloride: 96 mmol/L — ABNORMAL LOW (ref 98–111)
Creatinine: 0.92 mg/dL (ref 0.61–1.24)
GFR, Est AFR Am: >60 mL/min (ref >60)
GFR, Estimated: >60 mL/min (ref >60)
Glucose, Bld: 236 mg/dL — ABNORMAL HIGH (ref 70–99)
Potassium: 3.8 mmol/L (ref 3.5–5.1)
Sodium: 131 mmol/L — ABNORMAL LOW (ref 135–145)
Total Bilirubin: 0.2 mg/dL — ABNORMAL LOW (ref 0.3–1.2)
Total Protein: 6.9 g/dL (ref 6.5–8.1)

## 2020-02-12 LAB — CBC WITH DIFFERENTIAL (CANCER CENTER ONLY)
Abs Immature Granulocytes: 0.1 K/uL — ABNORMAL HIGH (ref 0.00–0.07)
Basophils Absolute: 0 K/uL (ref 0.0–0.1)
Basophils Relative: 0 %
Eosinophils Absolute: 0.1 K/uL (ref 0.0–0.5)
Eosinophils Relative: 1 %
HCT: 35.8 % — ABNORMAL LOW (ref 39.0–52.0)
Hemoglobin: 12.2 g/dL — ABNORMAL LOW (ref 13.0–17.0)
Immature Granulocytes: 2 %
Lymphocytes Relative: 36 %
Lymphs Abs: 2.2 K/uL (ref 0.7–4.0)
MCH: 31.6 pg (ref 26.0–34.0)
MCHC: 34.1 g/dL (ref 30.0–36.0)
MCV: 92.7 fL (ref 80.0–100.0)
Monocytes Absolute: 0.8 K/uL (ref 0.1–1.0)
Monocytes Relative: 13 %
Neutro Abs: 3 K/uL (ref 1.7–7.7)
Neutrophils Relative %: 48 %
Platelet Count: 274 K/uL (ref 150–400)
RBC: 3.86 MIL/uL — ABNORMAL LOW (ref 4.22–5.81)
RDW: 15.9 % — ABNORMAL HIGH (ref 11.5–15.5)
WBC Count: 6.1 K/uL (ref 4.0–10.5)
nRBC: 0.7 % — ABNORMAL HIGH (ref 0.0–0.2)

## 2020-02-12 MED ORDER — SODIUM CHLORIDE 0.9 % IV SOLN
Freq: Once | INTRAVENOUS | Status: AC
  Filled 2020-02-12: qty 250

## 2020-02-12 MED ORDER — CYANOCOBALAMIN 1000 MCG/ML IJ SOLN
INTRAMUSCULAR | Status: AC
  Filled 2020-02-12: qty 1

## 2020-02-12 MED ORDER — SODIUM CHLORIDE 0.9 % IV SOLN
200.0000 mg | Freq: Once | INTRAVENOUS | Status: AC
  Administered 2020-02-12: 15:00:00 200 mg via INTRAVENOUS
  Filled 2020-02-12: qty 8

## 2020-02-12 MED ORDER — HEPARIN SOD (PORK) LOCK FLUSH 100 UNIT/ML IV SOLN
500.0000 [IU] | Freq: Once | INTRAVENOUS | Status: AC | PRN
  Administered 2020-02-12: 16:00:00 500 [IU]
  Filled 2020-02-12: qty 5

## 2020-02-12 MED ORDER — SODIUM CHLORIDE 0.9 % IV SOLN
500.0000 mg/m2 | Freq: Once | INTRAVENOUS | Status: AC
  Administered 2020-02-12: 15:00:00 1200 mg via INTRAVENOUS
  Filled 2020-02-12: qty 40

## 2020-02-12 MED ORDER — SODIUM CHLORIDE 0.9% FLUSH
10.0000 mL | Freq: Once | INTRAVENOUS | Status: AC
  Administered 2020-02-12: 12:00:00 10 mL
  Filled 2020-02-12: qty 10

## 2020-02-12 MED ORDER — PALONOSETRON HCL INJECTION 0.25 MG/5ML
0.2500 mg | Freq: Once | INTRAVENOUS | Status: AC
  Administered 2020-02-12: 13:00:00 0.25 mg via INTRAVENOUS

## 2020-02-12 MED ORDER — SODIUM CHLORIDE 0.9% FLUSH
10.0000 mL | INTRAVENOUS | Status: DC | PRN
  Administered 2020-02-12: 10 mL
  Filled 2020-02-12: qty 10

## 2020-02-12 MED ORDER — CYANOCOBALAMIN 1000 MCG/ML IJ SOLN
1000.0000 ug | Freq: Once | INTRAMUSCULAR | Status: AC
  Administered 2020-02-12: 15:00:00 1000 ug via INTRAMUSCULAR

## 2020-02-12 MED ORDER — PALONOSETRON HCL INJECTION 0.25 MG/5ML
INTRAVENOUS | Status: AC
  Filled 2020-02-12: qty 5

## 2020-02-12 MED ORDER — SODIUM CHLORIDE 0.9 % IV SOLN
10.0000 mg | Freq: Once | INTRAVENOUS | Status: AC
  Administered 2020-02-12: 13:00:00 10 mg via INTRAVENOUS
  Filled 2020-02-12: qty 10

## 2020-02-12 MED ORDER — SODIUM CHLORIDE 0.9 % IV SOLN
750.0000 mg | Freq: Once | INTRAVENOUS | Status: AC
  Administered 2020-02-12: 16:00:00 750 mg via INTRAVENOUS
  Filled 2020-02-12: qty 75

## 2020-02-12 MED ORDER — SODIUM CHLORIDE 0.9 % IV SOLN
150.0000 mg | Freq: Once | INTRAVENOUS | Status: AC
  Administered 2020-02-12: 14:00:00 150 mg via INTRAVENOUS
  Filled 2020-02-12: qty 150

## 2020-02-12 NOTE — Patient Instructions (Signed)
[facility] Discharge Instructions for Patients Receiving Chemotherapy  Today you received the following chemotherapy agents: pembrolizumab, pemetrexed, and carboplatin.  To help prevent nausea and vomiting after your treatment, we encourage you to take your nausea medication as directed.   If you develop nausea and vomiting that is not controlled by your nausea medication, call the clinic.   BELOW ARE SYMPTOMS THAT SHOULD BE REPORTED IMMEDIATELY:  *FEVER GREATER THAN 100.5 F  *CHILLS WITH OR WITHOUT FEVER  NAUSEA AND VOMITING THAT IS NOT CONTROLLED WITH YOUR NAUSEA MEDICATION  *UNUSUAL SHORTNESS OF BREATH  *UNUSUAL BRUISING OR BLEEDING  TENDERNESS IN MOUTH AND THROAT WITH OR WITHOUT PRESENCE OF ULCERS  *URINARY PROBLEMS  *BOWEL PROBLEMS  UNUSUAL RASH Items with * indicate a potential emergency and should be followed up as soon as possible.  Feel free to call the clinic should you have any questions or concerns. The clinic phone number is [phone].  Please show the CHEMO ALERT CARD at check-in to the Emergency Department and triage nurse.   

## 2020-02-12 NOTE — Progress Notes (Signed)
[place] [phone]   [phone]  OFFICE PROGRESS NOTE  [name], MD [address] [place]  [address] [address]  DIAGNOSIS: Stage IV (T2b, N3, M1a) non-small cell lung cancer, adenocarcinoma presented with large right upper lobe lung mass in addition to mediastinal and right supraclavicular lymphadenopathy as well as bilateral pulmonary nodules diagnosed in February 2021.  MOLECULAR STUDY by [employer]:  KRASG12C, 4.3%, Binimetinib  [id number], 0.9%, Niraparib, Olaparib, Rucaparib,Talazoparib, Tazemetostat  [id number], 1.7%, None   PRIOR THERAPY: None  CURRENT THERAPY: Systemic chemotherapy with carboplatin for AUC of 5, Alimta 500 mg/M2 and Keytruda 200 mg IV every 3 weeks.  First dose December 31, 2019.  Status post 2 cycles.  INTERVAL HISTORY: Kevin Lambert. 64 y.o. male returns to the clinic today for follow-up visit.  The patient is feeling fine today with no concerning complaints.  He has more energy than before.  He is feeling much better compared to few weeks ago.  He denied having any current chest pain, shortness of breath, cough or hemoptysis.  He denied having any fever or chills.  He has no nausea, vomiting, diarrhea or constipation.  He has no headache or visual changes.  He changed his mind about moving to [place].  He will stay in [place] for now.  Is here today for evaluation before starting cycle #3.   MEDICAL HISTORY: Past Medical History:  Diagnosis Date  . Arthritis   . Depression   . DM (diabetes mellitus) ([place])    type II  . Dyslipidemia   . Dyspnea    unable to walk much - he thinks it is from   . GERD (gastroesophageal reflux disease)   . Hepatitis    Hepatitis C- treated  . History of kidney stones   . HTN (hypertension)   . Neuropathy   . OSA on CPAP   . PTSD (post-traumatic stress disorder)     ALLERGIES:  is allergic to metformin and related.  MEDICATIONS:  Current Outpatient  Medications  Medication Sig Dispense Refill  . albuterol (VENTOLIN HFA) 108 (90 Base) MCG/ACT inhaler Inhale 2 puffs into the lungs every 4 (four) hours as needed for wheezing or shortness of breath. 18 g 3  . amLODipine (NORVASC) 10 MG tablet Take 10 mg by mouth daily.    Marland Kitchen aspirin EC 81 MG tablet Take 81 mg by mouth daily.    Marland Kitchen atorvastatin (LIPITOR) 10 MG tablet Take 10 mg by mouth daily.    . bisacodyl (DULCOLAX) 5 MG EC tablet Take 5 mg by mouth daily as needed for moderate constipation.    . calcium carbonate (TUMS - DOSED IN MG ELEMENTAL CALCIUM) 500 MG chewable tablet Chew 2 tablets by mouth daily as needed for indigestion or heartburn.    . folic acid (FOLVITE) 1 MG tablet Take 1 tablet (1 mg total) by mouth daily. Pt needs to pick up today and start ASAP . He will pay out of pocket. He is waiting on the [place] to send his folic acid which will be next week. 7 tablet 0  . glipiZIDE (GLUCOTROL) 5 MG tablet Take 5 mg by mouth daily before breakfast.     . HYDROcodone-acetaminophen (NORCO/VICODIN) 5-325 MG tablet Take 1 tablet by mouth every 6 (six) hours as needed for moderate pain.    Marland Kitchen lidocaine-prilocaine (EMLA) cream Apply to Port-A-Cath site 30-60-minute before treatment. 30 g 0  . magic mouthwash SOLN Take 5 mLs by mouth  4 (four) times daily as needed for mouth pain. Swish and swallow or spit 240 mL 2  . Menthol ([employer] LOZENGES MT) Use as directed 1 lozenge in the mouth or throat daily as needed (throat).     . naproxen (NAPROSYN) 500 MG tablet Take 500 mg by mouth 2 (two) times daily with a meal.    . oxyCODONE-acetaminophen (PERCOCET/ROXICET) 5-325 MG tablet Take 1 tablet by mouth every 6 (six) hours as needed for severe pain. 12 tablet 0  . pregabalin (LYRICA) 25 MG capsule Take 1 capsule (25 mg total) by mouth daily. 30 capsule 1  . psyllium (METAMUCIL) 58.6 % powder Take 1 packet by mouth daily.    . sildenafil (VIAGRA) 100 MG tablet Take 100 mg by mouth daily as needed for erectile  dysfunction.    . triamterene-hydrochlorothiazide (MAXZIDE-25) 37.5-25 MG tablet Take 1 tablet by mouth daily as needed (swelling).     Marland Kitchen umeclidinium-vilanterol (ANORO ELLIPTA) 62.5-25 MCG/INH AEPB Inhale 1 puff into the lungs daily. 60 each 11  . umeclidinium-vilanterol (ANORO ELLIPTA) 62.5-25 MCG/INH AEPB Inhale 1 puff into the lungs daily. 7 each 0   No current facility-administered medications for this visit.    SURGICAL HISTORY:  Past Surgical History:  Procedure Laterality Date  . BIOPSY OF MEDIASTINAL MASS  12/10/2019   Procedure: BIOPSY OF MEDIASTINAL MASS;  Surgeon: Garner Nash, DO;  Location: [facility];  Service: Pulmonary;;  right upper lobe  . BRONCHIAL NEEDLE ASPIRATION BIOPSY  12/10/2019   Procedure: BRONCHIAL NEEDLE ASPIRATION BIOPSIES;  Surgeon: Garner Nash, DO;  Location: [facility];  Service: Pulmonary;;  . COLONOSCOPY    . IR IMAGING GUIDED PORT INSERTION  12/31/2019  . kidney stone    . URETERAL STENT PLACEMENT    . Ureteral Stent Removed    . VIDEO BRONCHOSCOPY WITH ENDOBRONCHIAL ULTRASOUND N/A 12/10/2019   Procedure: VIDEO BRONCHOSCOPY WITH ENDOBRONCHIAL ULTRASOUND;  Surgeon: Garner Nash, DO;  Location: [facility];  Service: Pulmonary;  Laterality: N/A;    REVIEW OF SYSTEMS:  A comprehensive review of systems was negative.   PHYSICAL EXAMINATION: General appearance: alert, cooperative and no distress Head: Normocephalic, without obvious abnormality, atraumatic Neck: no adenopathy, no JVD, supple, symmetrical, trachea midline and thyroid not enlarged, symmetric, no tenderness/mass/nodules Lymph nodes: Cervical, supraclavicular, and axillary nodes normal. Resp: clear to auscultation bilaterally Back: symmetric, no curvature. ROM normal. No CVA tenderness. Cardio: regular rate and rhythm, S1, S2 normal, no murmur, click, rub or gallop GI: soft, non-tender; bowel sounds normal; no masses,  no organomegaly Extremities: extremities normal, atraumatic,  no cyanosis or edema  ECOG PERFORMANCE STATUS: 1 - Symptomatic but completely ambulatory  Blood pressure (!) 157/73, pulse 100, temperature 98.7 F (37.1 C), temperature source Temporal, resp. rate 18, height 5\' 9"  (1.753 m), weight 240 lb 12.8 oz (109.2 kg), SpO2 99 %.  LABORATORY DATA: Lab Results  Component Value Date   WBC 6.1 02/12/2020   HGB 12.2 (L) 02/12/2020   HCT 35.8 (L) 02/12/2020   MCV 92.7 02/12/2020   PLT 274 02/12/2020      Chemistry      Component Value Date/Time   NA 131 (L) 02/12/2020 1141   K 3.8 02/12/2020 1141   CL 96 (L) 02/12/2020 1141   CO2 26 02/12/2020 1141   BUN 17 02/12/2020 1141   CREATININE 0.92 02/12/2020 1141      Component Value Date/Time   CALCIUM 10.0 02/12/2020 1141   ALKPHOS 102 02/12/2020 1141  AST 57 (H) 02/12/2020 1141   ALT 104 (H) 02/12/2020 1141   BILITOT 0.2 (L) 02/12/2020 1141       RADIOGRAPHIC STUDIES: VAS [place] LOWER EXTREMITY VENOUS (DVT) (ONLY [facility] & [facility])  Result Date: 01/18/2020  Lower Venous DVTStudy Indications: Swelling, and Edema.  Comparison Study: no prior Performing Technologist: Abram Sander RVS  Examination Guidelines: A complete evaluation includes B-mode imaging, spectral Doppler, color Doppler, and power Doppler as needed of all accessible portions of each vessel. Bilateral testing is considered an integral part of a complete examination. Limited examinations for reoccurring indications may be performed as noted. The reflux portion of the exam is performed with the patient in reverse Trendelenburg.  +---------+---------------+---------+-----------+----------+--------------+ RIGHT    CompressibilityPhasicitySpontaneityPropertiesThrombus Aging +---------+---------------+---------+-----------+----------+--------------+ CFV      Full           Yes      Yes                                 +---------+---------------+---------+-----------+----------+--------------+ SFJ      Full                                                         +---------+---------------+---------+-----------+----------+--------------+ FV Prox  Full                                                        +---------+---------------+---------+-----------+----------+--------------+ FV Mid   Full                                                        +---------+---------------+---------+-----------+----------+--------------+ FV DistalFull                                                        +---------+---------------+---------+-----------+----------+--------------+ PFV      Full                                                        +---------+---------------+---------+-----------+----------+--------------+ POP      Full           Yes      Yes                                 +---------+---------------+---------+-----------+----------+--------------+ PTV      Full                                                        +---------+---------------+---------+-----------+----------+--------------+  PERO                                                  Not visualized +---------+---------------+---------+-----------+----------+--------------+   +---------+---------------+---------+-----------+----------+--------------+ LEFT     CompressibilityPhasicitySpontaneityPropertiesThrombus Aging +---------+---------------+---------+-----------+----------+--------------+ CFV      Full           Yes      Yes                                 +---------+---------------+---------+-----------+----------+--------------+ SFJ      Full                                                        +---------+---------------+---------+-----------+----------+--------------+ FV Prox  Full                                                        +---------+---------------+---------+-----------+----------+--------------+ FV Mid   Full                                                         +---------+---------------+---------+-----------+----------+--------------+ FV DistalFull                                                        +---------+---------------+---------+-----------+----------+--------------+ PFV      Full                                                        +---------+---------------+---------+-----------+----------+--------------+ POP      Full           Yes      Yes                                 +---------+---------------+---------+-----------+----------+--------------+ PTV      Full                                                        +---------+---------------+---------+-----------+----------+--------------+ PERO                                                  Not visualized +---------+---------------+---------+-----------+----------+--------------+  Summary: BILATERAL: - No evidence of deep vein thrombosis seen in the lower extremities, bilaterally.   *See table(s) above for measurements and observations. Electronically signed by Kaysen Barban MD on 01/18/2020 at 79:04:52 PM.    Final     ASSESSMENT AND PLAN: This is a very pleasant 64 years old African-American male recently diagnosed with a stage IV non-small cell lung cancer, adenocarcinoma with no actionable mutations presented with large right lower lobe lung mass in addition to mediastinal and right supraclavicular lymphadenopathy as well as bilateral pulmonary nodules diagnosed in February 2021. I explained to the patient that he has incurable condition and all the treatment options will be of palliative nature. The patient has no actionable mutations on the recent molecular studies and he is not a candidate for the [facility] clinical trial. He started systemic chemotherapy with carboplatin, Alimta and Keytruda status post 2 cycles.  The patient continues to tolerate his treatment well with no concerning adverse effects.  He did much better with cycle #2. I recommended for him  to proceed with cycle #3 today as planned. I will see him back for follow-up visit in 3 weeks for evaluation after repeating CT scan of the chest for restaging of his disease. The patient was advised to call immediately if he has any concerning symptoms in the interval. The patient voices understanding of current disease status and treatment options and is in agreement with the current care plan.  All questions were answered. The patient knows to call the clinic with any problems, questions or concerns. We can certainly see the patient much sooner if necessary.  Disclaimer: This note was dictated with voice recognition software. Similar sounding words can inadvertently be transcribed and may not be corrected upon review.

## 2020-02-12 NOTE — Patient Instructions (Signed)
Steps to Quit Smoking Smoking tobacco is the leading cause of preventable death. It can affect almost every organ in the body. Smoking puts you and people around you at risk for many serious, long-lasting (chronic) diseases. Quitting smoking can be hard, but it is one of the best things that you can do for your health. It is never too late to quit. How do I get ready to quit? When you decide to quit smoking, make a plan to help you succeed. Before you quit:  Pick a date to quit. Set a date within the next 2 weeks to give you time to prepare.  Write down the reasons why you are quitting. Keep this list in places where you will see it often.  Tell your family, friends, and co-workers that you are quitting. Their support is important.  Talk with your doctor about the choices that may help you quit.  Find out if your health insurance will pay for these treatments.  Know the people, places, things, and activities that make you want to smoke (triggers). Avoid them. What first steps can I take to quit smoking?  Throw away all cigarettes at home, at work, and in your car.  Throw away the things that you use when you smoke, such as ashtrays and lighters.  Clean your car. Make sure to empty the ashtray.  Clean your home, including curtains and carpets. What can I do to help me quit smoking? Talk with your doctor about taking medicines and seeing a counselor at the same time. You are more likely to succeed when you do both.  If you are pregnant or breastfeeding, talk with your doctor about counseling or other ways to quit smoking. Do not take medicine to help you quit smoking unless your doctor tells you to do so. To quit smoking: Quit right away  Quit smoking totally, instead of slowly cutting back on how much you smoke over a period of time.  Go to counseling. You are more likely to quit if you go to counseling sessions regularly. Take medicine You may take medicines to help you quit. Some  medicines need a prescription, and some you can buy over-the-counter. Some medicines may contain a drug called nicotine to replace the nicotine in cigarettes. Medicines may:  Help you to stop having the desire to smoke (cravings).  Help to stop the problems that come when you stop smoking (withdrawal symptoms). Your doctor may ask you to use:  Nicotine patches, gum, or lozenges.  Nicotine inhalers or sprays.  Non-nicotine medicine that is taken by mouth. Find resources Find resources and other ways to help you quit smoking and remain smoke-free after you quit. These resources are most helpful when you use them often. They include:  Online chats with a counselor.  Phone quitlines.  Printed self-help materials.  Support groups or group counseling.  Text messaging programs.  Mobile phone apps. Use apps on your mobile phone or tablet that can help you stick to your quit plan. There are many free apps for mobile phones and tablets as well as websites. Examples include Quit Guide from the CDC and smokefree.gov  What things can I do to make it easier to quit?   Talk to your family and friends. Ask them to support and encourage you.  Call a phone quitline (1-800-QUIT-NOW), reach out to support groups, or work with a counselor.  Ask people who smoke to not smoke around you.  Avoid places that make you want to smoke,   such as: ? Bars. ? Parties. ? Smoke-break areas at work.  Spend time with people who do not smoke.  Lower the stress in your life. Stress can make you want to smoke. Try these things to help your stress: ? Getting regular exercise. ? Doing deep-breathing exercises. ? Doing yoga. ? Meditating. ? Doing a body scan. To do this, close your eyes, focus on one area of your body at a time from head to toe. Notice which parts of your body are tense. Try to relax the muscles in those areas. How will I feel when I quit smoking? Day 1 to 3 weeks Within the first 24 hours,  you may start to have some problems that come from quitting tobacco. These problems are very bad 2-3 days after you quit, but they do not often last for more than 2-3 weeks. You may get these symptoms:  Mood swings.  Feeling restless, nervous, angry, or annoyed.  Trouble concentrating.  Dizziness.  Strong desire for high-sugar foods and nicotine.  Weight gain.  Trouble pooping (constipation).  Feeling like you may vomit (nausea).  Coughing or a sore throat.  Changes in how the medicines that you take for other issues work in your body.  Depression.  Trouble sleeping (insomnia). Week 3 and afterward After the first 2-3 weeks of quitting, you may start to notice more positive results, such as:  Better sense of smell and taste.  Less coughing and sore throat.  Slower heart rate.  Lower blood pressure.  Clearer skin.  Better breathing.  Fewer sick days. Quitting smoking can be hard. Do not give up if you fail the first time. Some people need to try a few times before they succeed. Do your best to stick to your quit plan, and talk with your doctor if you have any questions or concerns. Summary  Smoking tobacco is the leading cause of preventable death. Quitting smoking can be hard, but it is one of the best things that you can do for your health.  When you decide to quit smoking, make a plan to help you succeed.  Quit smoking right away, not slowly over a period of time.  When you start quitting, seek help from your doctor, family, or friends. This information is not intended to replace advice given to you by your health care provider. Make sure you discuss any questions you have with your health care provider. Document Revised: 07/19/2019 Document Reviewed: 01/12/2019 Elsevier Patient Education  2020 [employer].  

## 2020-02-17 ENCOUNTER — Telehealth: Payer: Self-pay | Admitting: Internal Medicine

## 2020-02-17 NOTE — Telephone Encounter (Signed)
Faxed medical records to [facility] at [phone]. Release ID [id number]

## 2020-02-19 ENCOUNTER — Telehealth: Payer: Self-pay | Admitting: Medical Oncology

## 2020-02-19 ENCOUNTER — Inpatient Hospital Stay: Payer: No Typology Code available for payment source

## 2020-02-19 ENCOUNTER — Telehealth: Payer: Self-pay | Admitting: Internal Medicine

## 2020-02-19 NOTE — Telephone Encounter (Signed)
missed appt due to diarrhea after taking something for constipation.   Sore throat,  discomfort with swallowing. Pt instructed to start with salt water or baking soda /water gargle and change to MMW if not better .

## 2020-02-19 NOTE — Telephone Encounter (Signed)
R/s appt per 4/14 sch message - unable to reach pt , left message with appt date and time

## 2020-02-20 ENCOUNTER — Other Ambulatory Visit: Payer: Self-pay

## 2020-02-20 ENCOUNTER — Telehealth: Payer: Self-pay | Admitting: *Deleted

## 2020-02-20 ENCOUNTER — Inpatient Hospital Stay: Payer: No Typology Code available for payment source

## 2020-02-20 DIAGNOSIS — Z95828 Presence of other vascular implants and grafts: Secondary | ICD-10-CM

## 2020-02-20 DIAGNOSIS — C3491 Malignant neoplasm of unspecified part of right bronchus or lung: Secondary | ICD-10-CM

## 2020-02-20 DIAGNOSIS — Z5112 Encounter for antineoplastic immunotherapy: Secondary | ICD-10-CM | POA: Diagnosis not present

## 2020-02-20 LAB — CBC WITH DIFFERENTIAL (CANCER CENTER ONLY)
Abs Immature Granulocytes: 0.02 10*3/uL (ref 0.00–0.07)
Basophils Absolute: 0 10*3/uL (ref 0.0–0.1)
Basophils Relative: 0 %
Eosinophils Absolute: 0 10*3/uL (ref 0.0–0.5)
Eosinophils Relative: 0 %
HCT: 34.8 % — ABNORMAL LOW (ref 39.0–52.0)
Hemoglobin: 12.1 g/dL — ABNORMAL LOW (ref 13.0–17.0)
Immature Granulocytes: 1 %
Lymphocytes Relative: 43 %
Lymphs Abs: 1.5 10*3/uL (ref 0.7–4.0)
MCH: 31.3 pg (ref 26.0–34.0)
MCHC: 34.8 g/dL (ref 30.0–36.0)
MCV: 90.2 fL (ref 80.0–100.0)
Monocytes Absolute: 0.5 10*3/uL (ref 0.1–1.0)
Monocytes Relative: 13 %
Neutro Abs: 1.6 10*3/uL — ABNORMAL LOW (ref 1.7–7.7)
Neutrophils Relative %: 43 %
Platelet Count: 95 10*3/uL — ABNORMAL LOW (ref 150–400)
RBC: 3.86 MIL/uL — ABNORMAL LOW (ref 4.22–5.81)
RDW: 15.1 % (ref 11.5–15.5)
WBC Count: 3.6 10*3/uL — ABNORMAL LOW (ref 4.0–10.5)
nRBC: 0 % (ref 0.0–0.2)

## 2020-02-20 LAB — CMP (CANCER CENTER ONLY)
ALT: 57 U/L — ABNORMAL HIGH (ref 0–44)
AST: 35 U/L (ref 15–41)
Albumin: 3.5 g/dL (ref 3.5–5.0)
Alkaline Phosphatase: 98 U/L (ref 38–126)
Anion gap: 11 (ref 5–15)
BUN: 10 mg/dL (ref 8–23)
CO2: 27 mmol/L (ref 22–32)
Calcium: 10.4 mg/dL — ABNORMAL HIGH (ref 8.9–10.3)
Chloride: 96 mmol/L — ABNORMAL LOW (ref 98–111)
Creatinine: 0.98 mg/dL (ref 0.61–1.24)
GFR, Est AFR Am: >60 mL/min (ref >60)
GFR, Estimated: >60 mL/min (ref >60)
Glucose, Bld: 214 mg/dL — ABNORMAL HIGH (ref 70–99)
Potassium: 4.1 mmol/L (ref 3.5–5.1)
Sodium: 134 mmol/L — ABNORMAL LOW (ref 135–145)
Total Bilirubin: 0.4 mg/dL (ref 0.3–1.2)
Total Protein: 6.9 g/dL (ref 6.5–8.1)

## 2020-02-20 MED ORDER — SODIUM CHLORIDE 0.9% FLUSH
10.0000 mL | Freq: Once | INTRAVENOUS | Status: AC
  Administered 2020-02-20: 10 mL
  Filled 2020-02-20: qty 10

## 2020-02-20 MED ORDER — HEPARIN SOD (PORK) LOCK FLUSH 100 UNIT/ML IV SOLN
500.0000 [IU] | Freq: Once | INTRAVENOUS | Status: AC
  Administered 2020-02-20: 500 [IU]
  Filled 2020-02-20: qty 5

## 2020-02-20 NOTE — Patient Instructions (Signed)

## 2020-02-20 NOTE — Telephone Encounter (Signed)
Per Dr.Mohamed, reviewed labs with pt. Advised pt to use precautions due to WBC being low and watch for bleeding. Pt verbalized understanding.

## 2020-02-26 ENCOUNTER — Inpatient Hospital Stay: Payer: No Typology Code available for payment source

## 2020-02-26 ENCOUNTER — Other Ambulatory Visit: Payer: Non-veteran care

## 2020-02-26 ENCOUNTER — Other Ambulatory Visit: Payer: Self-pay

## 2020-02-26 ENCOUNTER — Telehealth: Payer: Self-pay | Admitting: Medical Oncology

## 2020-02-26 ENCOUNTER — Telehealth: Payer: Self-pay | Admitting: Physician Assistant

## 2020-02-26 DIAGNOSIS — Z5112 Encounter for antineoplastic immunotherapy: Secondary | ICD-10-CM | POA: Diagnosis not present

## 2020-02-26 DIAGNOSIS — C3491 Malignant neoplasm of unspecified part of right bronchus or lung: Secondary | ICD-10-CM

## 2020-02-26 DIAGNOSIS — Z95828 Presence of other vascular implants and grafts: Secondary | ICD-10-CM

## 2020-02-26 LAB — CBC WITH DIFFERENTIAL (CANCER CENTER ONLY)
Abs Immature Granulocytes: 0.03 10*3/uL (ref 0.00–0.07)
Basophils Absolute: 0 10*3/uL (ref 0.0–0.1)
Basophils Relative: 0 %
Eosinophils Absolute: 0 10*3/uL (ref 0.0–0.5)
Eosinophils Relative: 0 %
HCT: 35.1 % — ABNORMAL LOW (ref 39.0–52.0)
Hemoglobin: 11.9 g/dL — ABNORMAL LOW (ref 13.0–17.0)
Immature Granulocytes: 1 %
Lymphocytes Relative: 32 %
Lymphs Abs: 1.4 10*3/uL (ref 0.7–4.0)
MCH: 31.7 pg (ref 26.0–34.0)
MCHC: 33.9 g/dL (ref 30.0–36.0)
MCV: 93.6 fL (ref 80.0–100.0)
Monocytes Absolute: 0.6 10*3/uL (ref 0.1–1.0)
Monocytes Relative: 13 %
Neutro Abs: 2.4 10*3/uL (ref 1.7–7.7)
Neutrophils Relative %: 54 %
Platelet Count: 98 10*3/uL — ABNORMAL LOW (ref 150–400)
RBC: 3.75 MIL/uL — ABNORMAL LOW (ref 4.22–5.81)
RDW: 16.9 % — ABNORMAL HIGH (ref 11.5–15.5)
WBC Count: 4.5 10*3/uL (ref 4.0–10.5)
nRBC: 0 % (ref 0.0–0.2)

## 2020-02-26 LAB — CMP (CANCER CENTER ONLY)
ALT: 55 U/L — ABNORMAL HIGH (ref 0–44)
AST: 22 U/L (ref 15–41)
Albumin: 3.5 g/dL (ref 3.5–5.0)
Alkaline Phosphatase: 107 U/L (ref 38–126)
Anion gap: 11 (ref 5–15)
BUN: 12 mg/dL (ref 8–23)
CO2: 26 mmol/L (ref 22–32)
Calcium: 9.5 mg/dL (ref 8.9–10.3)
Chloride: 97 mmol/L — ABNORMAL LOW (ref 98–111)
Creatinine: 1 mg/dL (ref 0.61–1.24)
GFR, Est AFR Am: >60 mL/min (ref >60)
GFR, Estimated: >60 mL/min (ref >60)
Glucose, Bld: 333 mg/dL — ABNORMAL HIGH (ref 70–99)
Potassium: 4.3 mmol/L (ref 3.5–5.1)
Sodium: 134 mmol/L — ABNORMAL LOW (ref 135–145)
Total Bilirubin: <0.2 mg/dL — ABNORMAL LOW (ref 0.3–1.2)
Total Protein: 6.8 g/dL (ref 6.5–8.1)

## 2020-02-26 LAB — TSH: TSH: 0.832 u[IU]/mL (ref 0.320–4.118)

## 2020-02-26 MED ORDER — HEPARIN SOD (PORK) LOCK FLUSH 100 UNIT/ML IV SOLN
500.0000 [IU] | Freq: Once | INTRAVENOUS | Status: AC
  Administered 2020-02-26: 12:00:00 500 [IU]
  Filled 2020-02-26: qty 5

## 2020-02-26 MED ORDER — SODIUM CHLORIDE 0.9% FLUSH
10.0000 mL | Freq: Once | INTRAVENOUS | Status: AC
  Administered 2020-02-26: 12:00:00 10 mL
  Filled 2020-02-26: qty 10

## 2020-02-26 NOTE — Telephone Encounter (Signed)
Disabilty paperwork placed in pickup bin for Kevin Lambert

## 2020-02-26 NOTE — Telephone Encounter (Signed)
Called the patient to advise him to monitor his BS closely at home. Spoke to the patient's sister. Blood sugar elevated on routine labs. Advised to monitor at home and to follow up with his PCP if his BS runs consistently too high. She expressed understanding and will pass on the message.

## 2020-02-27 NOTE — Progress Notes (Signed)
Pharmacist Chemotherapy Monitoring - Follow Up Assessment    I verify that I have reviewed each item in the below checklist:  . Regimen for the patient is scheduled for the appropriate day and plan matches scheduled date. Marland Kitchen Appropriate non-routine labs are ordered dependent on drug ordered. . If applicable, additional medications reviewed and ordered per protocol based on lifetime cumulative doses and/or treatment regimen.   Plan for follow-up and/or issues identified: No . I-vent associated with next due treatment: No . MD and/or nursing notified: No  Acquanetta Belling 02/27/2020 11:10 AM

## 2020-03-02 ENCOUNTER — Ambulatory Visit (HOSPITAL_COMMUNITY)
Admission: RE | Admit: 2020-03-02 | Discharge: 2020-03-02 | Disposition: A | Discharge status: Home / Self Care | Payer: No Typology Code available for payment source | Source: Ambulatory Visit | Attending: Internal Medicine | Admitting: Internal Medicine

## 2020-03-02 ENCOUNTER — Other Ambulatory Visit: Payer: Self-pay

## 2020-03-02 ENCOUNTER — Encounter (HOSPITAL_COMMUNITY): Payer: Self-pay

## 2020-03-02 DIAGNOSIS — C3490 Malignant neoplasm of unspecified part of unspecified bronchus or lung: Secondary | ICD-10-CM | POA: Diagnosis not present

## 2020-03-02 HISTORY — DX: Malignant (primary) neoplasm, unspecified: C80.1

## 2020-03-02 MED ORDER — IOHEXOL 300 MG/ML  SOLN
100.0000 mL | Freq: Once | INTRAMUSCULAR | Status: AC | PRN
  Administered 2020-03-02: 100 mL via INTRAVENOUS

## 2020-03-02 MED ORDER — SODIUM CHLORIDE (PF) 0.9 % IJ SOLN
INTRAMUSCULAR | Status: AC
  Filled 2020-03-02: qty 50

## 2020-03-02 MED ORDER — HEPARIN SOD (PORK) LOCK FLUSH 100 UNIT/ML IV SOLN
INTRAVENOUS | Status: AC
  Filled 2020-03-02: qty 5

## 2020-03-02 MED ORDER — HEPARIN SOD (PORK) LOCK FLUSH 100 UNIT/ML IV SOLN: 500.0000 [IU] | Freq: Once | INTRAVENOUS | Status: DC

## 2020-03-03 NOTE — Progress Notes (Deleted)
[facility] OFFICE PROGRESS NOTE  [name], [address]  [place] [address]  DIAGNOSIS: Stage IV (T2b, N3, M1a) non-small cell lung cancer, adenocarcinoma presented with large right upper lobe lung mass in addition to mediastinal and right supraclavicular lymphadenopathy as well as bilateral pulmonary nodules diagnosed in February 2021.  MOLECULAR STUDY by [employer]:  KRASG12C,4.3%,Binimetinib  [id number],0.9%,Niraparib, Olaparib, Rucaparib,Talazoparib, Tazemetostat  [id number],1.5%,BWIO  PRIOR THERAPY: None  CURRENT THERAPY: Systemic chemotherapy with carboplatin for AUC of 5, Alimta 500 mg/M2 and Keytruda 200 mg IV every 3 weeks. First dose December 31, 2019. Status post 3 cycles.   INTERVAL HISTORY: Kevin Lambert. 64 y.o. male returns to the clinic today for follow-up visit.  The patient is feeling __today without any concerning complaints except for __.  The patient is tolerating his treatment well without any adverse side effects except for __ .  Today, the patient denies any recent fever, chills, night sweats, or weight loss.  He reports his baseline shortness of breath with exertion without any cough, chest pain, or hemoptysis.  The patient has __smoking cigarettes.  He denies any nausea, vomiting, diarrhea, or constipation.  He denies any rashes or skin changes.  He denies any headaches or visual changes.  The patient recently had a restaging CT scan performed.  The patient is here today for evaluation and to review his scan results before starting cycle #4.  MEDICAL HISTORY: Past Medical History:  Diagnosis Date  . Arthritis   . Depression   . DM (diabetes mellitus) ([place])    type II  . Dyslipidemia   . Dyspnea    unable to walk much - he thinks it is from   . GERD (gastroesophageal reflux disease)   . Hepatitis    Hepatitis C- treated  . History of kidney stones   . HTN (hypertension)   . Neuropathy   . nscl ca dx'd  10/2019  . OSA on CPAP   . PTSD (post-traumatic stress disorder)     ALLERGIES:  is allergic to metformin and related.  MEDICATIONS:  Current Outpatient Medications  Medication Sig Dispense Refill  . albuterol (VENTOLIN HFA) 108 (90 Base) MCG/ACT inhaler Inhale 2 puffs into the lungs every 4 (four) hours as needed for wheezing or shortness of breath. 18 g 3  . amLODipine (NORVASC) 10 MG tablet Take 10 mg by mouth daily.    Marland Kitchen aspirin EC 81 MG tablet Take 81 mg by mouth daily.    Marland Kitchen atorvastatin (LIPITOR) 10 MG tablet Take 10 mg by mouth daily.    . bisacodyl (DULCOLAX) 5 MG EC tablet Take 5 mg by mouth daily as needed for moderate constipation.    . calcium carbonate (TUMS - DOSED IN MG ELEMENTAL CALCIUM) 500 MG chewable tablet Chew 2 tablets by mouth daily as needed for indigestion or heartburn.    . folic acid (FOLVITE) 1 MG tablet Take 1 tablet (1 mg total) by mouth daily. Pt needs to pick up today and start ASAP . He will pay out of pocket. He is waiting on the [place] to send his folic acid which will be next week. 7 tablet 0  . glipiZIDE (GLUCOTROL) 5 MG tablet Take 5 mg by mouth daily before breakfast.     . HYDROcodone-acetaminophen (NORCO/VICODIN) 5-325 MG tablet Take 1 tablet by mouth every 6 (six) hours as needed for moderate pain.    Marland Kitchen lidocaine-prilocaine (EMLA) cream Apply to Port-A-Cath site 30-60-minute before treatment. 30 g  0  . magic mouthwash SOLN Take 5 mLs by mouth 4 (four) times daily as needed for mouth pain. Swish and swallow or spit 240 mL 2  . Menthol ([employer] LOZENGES MT) Use as directed 1 lozenge in the mouth or throat daily as needed (throat).     . naproxen (NAPROSYN) 500 MG tablet Take 500 mg by mouth 2 (two) times daily with a meal.    . oxyCODONE-acetaminophen (PERCOCET/ROXICET) 5-325 MG tablet Take 1 tablet by mouth every 6 (six) hours as needed for severe pain. 12 tablet 0  . pregabalin (LYRICA) 25 MG capsule Take 1 capsule (25 mg total) by mouth daily. 30 capsule 1   . psyllium (METAMUCIL) 58.6 % powder Take 1 packet by mouth daily.    . sildenafil (VIAGRA) 100 MG tablet Take 100 mg by mouth daily as needed for erectile dysfunction.    . triamterene-hydrochlorothiazide (MAXZIDE-25) 37.5-25 MG tablet Take 1 tablet by mouth daily as needed (swelling).     Marland Kitchen umeclidinium-vilanterol (ANORO ELLIPTA) 62.5-25 MCG/INH AEPB Inhale 1 puff into the lungs daily. 60 each 11  . umeclidinium-vilanterol (ANORO ELLIPTA) 62.5-25 MCG/INH AEPB Inhale 1 puff into the lungs daily. 7 each 0   No current facility-administered medications for this visit.    SURGICAL HISTORY:  Past Surgical History:  Procedure Laterality Date  . BIOPSY OF MEDIASTINAL MASS  12/10/2019   Procedure: BIOPSY OF MEDIASTINAL MASS;  Surgeon: Garner Nash, DO;  Location: [facility];  Service: Pulmonary;;  right upper lobe  . BRONCHIAL NEEDLE ASPIRATION BIOPSY  12/10/2019   Procedure: BRONCHIAL NEEDLE ASPIRATION BIOPSIES;  Surgeon: Garner Nash, DO;  Location: [facility];  Service: Pulmonary;;  . COLONOSCOPY    . IR IMAGING GUIDED PORT INSERTION  12/31/2019  . kidney stone    . URETERAL STENT PLACEMENT    . Ureteral Stent Removed    . VIDEO BRONCHOSCOPY WITH ENDOBRONCHIAL ULTRASOUND N/A 12/10/2019   Procedure: VIDEO BRONCHOSCOPY WITH ENDOBRONCHIAL ULTRASOUND;  Surgeon: Garner Nash, DO;  Location: [facility];  Service: Pulmonary;  Laterality: N/A;    REVIEW OF SYSTEMS:   Review of Systems  Constitutional: Negative for appetite change, chills, fatigue, fever and unexpected weight change.  HENT:   Negative for mouth sores, nosebleeds, sore throat and trouble swallowing.   Eyes: Negative for eye problems and icterus.  Respiratory: Negative for cough, hemoptysis, shortness of breath and wheezing.   Cardiovascular: Negative for chest pain and leg swelling.  Gastrointestinal: Negative for abdominal pain, constipation, diarrhea, nausea and vomiting.  Genitourinary: Negative for bladder  incontinence, difficulty urinating, dysuria, frequency and hematuria.   Musculoskeletal: Negative for back pain, gait problem, neck pain and neck stiffness.  Skin: Negative for itching and rash.  Neurological: Negative for dizziness, extremity weakness, gait problem, headaches, light-headedness and seizures.  Hematological: Negative for adenopathy. Does not bruise/bleed easily.  Psychiatric/Behavioral: Negative for confusion, depression and sleep disturbance. The patient is not nervous/anxious.     PHYSICAL EXAMINATION:  There were no vitals taken for this visit.  ECOG PERFORMANCE STATUS: {CHL ONC ECOG [id number]  Physical Exam  Constitutional: Oriented to person, place, and time and well-developed, well-nourished, and in no distress. No distress.  HENT:  Head: Normocephalic and atraumatic.  Mouth/Throat: Oropharynx is clear and moist. No oropharyngeal exudate.  Eyes: Conjunctivae are normal. Right eye exhibits no discharge. Left eye exhibits no discharge. No scleral icterus.  Neck: Normal range of motion. Neck supple.  Cardiovascular: Normal rate, regular rhythm, normal heart sounds and  intact distal pulses.   Pulmonary/Chest: Effort normal and breath sounds normal. No respiratory distress. No wheezes. No rales.  Abdominal: Soft. Bowel sounds are normal. Exhibits no distension and no mass. There is no tenderness.  Musculoskeletal: Normal range of motion. Exhibits no edema.  Lymphadenopathy:    No cervical adenopathy.  Neurological: Alert and oriented to person, place, and time. Exhibits normal muscle tone. Gait normal. Coordination normal.  Skin: Skin is warm and dry. No rash noted. Not diaphoretic. No erythema. No pallor.  Psychiatric: Mood, memory and judgment normal.  Vitals reviewed.  LABORATORY DATA: Lab Results  Component Value Date   WBC 4.5 02/26/2020   HGB 11.9 (L) 02/26/2020   HCT 35.1 (L) 02/26/2020   MCV 93.6 02/26/2020   PLT 98 (L) 02/26/2020      Chemistry       Component Value Date/Time   NA 134 (L) 02/26/2020 1234   K 4.3 02/26/2020 1234   CL 97 (L) 02/26/2020 1234   CO2 26 02/26/2020 1234   BUN 12 02/26/2020 1234   CREATININE 1.00 02/26/2020 1234      Component Value Date/Time   CALCIUM 9.5 02/26/2020 1234   ALKPHOS 107 02/26/2020 1234   AST 22 02/26/2020 1234   ALT 55 (H) 02/26/2020 1234   BILITOT <0.2 (L) 02/26/2020 1234       RADIOGRAPHIC STUDIES:  CT Chest W Contrast  Result Date: 03/02/2020 CLINICAL DATA:  Non-small-cell lung cancer, staging. Diagnosed 12/20 with chemotherapy ongoing. Immunotherapy on going. Short of breath on exertion. Constipation. EXAM: CT CHEST, ABDOMEN, AND PELVIS WITH CONTRAST TECHNIQUE: Multidetector CT imaging of the chest, abdomen and pelvis was performed following the standard protocol during bolus administration of intravenous contrast. CONTRAST:  143mL OMNIPAQUE IOHEXOL 300 MG/ML  SOLN COMPARISON:  PET 12/09/2019. FINDINGS: CT CHEST FINDINGS Cardiovascular: Right Port-A-Cath tip at mid right atrium. Aortic and branch vessel atherosclerosis. Normal heart size, without pericardial effusion. Multivessel coronary artery atherosclerosis. No central pulmonary embolism, on this non-dedicated study. Mediastinum/Nodes: Resolved borderline right supraclavicular adenopathy. Right paratracheal node measures 1.3 cm on 18/2 versus 2.0 cm on the prior exam. 1.5 cm subcarinal node on 26/2 measured 2.3 cm on the prior exam (when remeasured). No new thoracic adenopathy. Lungs/Pleura: No pleural fluid.  Mild centrilobular emphysema. Posterior right upper lobe lung mass measures 4.0 x 3.6 cm on 49/7. Somewhat difficult to compare to the prior PET, secondary to surrounding postobstructive pneumonitis on that exam. Felt to previously measure on the order of 5.7 x 4.8 cm (when remeasured). Resolution of previously described pulmonary nodules. Musculoskeletal: No acute osseous abnormality. CT ABDOMEN PELVIS FINDINGS Hepatobiliary:  Possible mild hepatic steatosis. No focal liver lesion. Normal gallbladder, without biliary ductal dilatation. Pancreas: Normal, without mass or ductal dilatation. Spleen: Normal in size, without focal abnormality. Adrenals/Urinary Tract: Mild left adrenal nodularity is unchanged. A right adrenal nodule of 2.3 cm is similar in size on the prior PET, and low-density, most consistent with an adenoma. Upper pole right renal 1.6 cm cyst. Normal left kidney, without hydronephrosis. Normal urinary bladder. Stomach/Bowel: Normal stomach, without wall thickening. Normal colon, appendix, and terminal ileum. Normal small bowel. Vascular/Lymphatic: Advanced aortic and branch vessel atherosclerosis. No abdominopelvic adenopathy. Reproductive: Normal prostate. Other: Small fat containing right inguinal hernia. Musculoskeletal: No acute osseous abnormality. IMPRESSION: 1. Response to therapy of right upper lobe lung mass and thoracic adenopathy since 12/09/2019. 2. Resolution of pulmonary nodules, consistent with response to therapy of pulmonary metastasis. 3. No new or progressive disease. 4.  Right adrenal adenoma. 5. Aortic atherosclerosis (ICD10-I70.0), coronary artery atherosclerosis and emphysema (ICD10-J43.9). Electronically Signed   By: Abigail Miyamoto M.D.   On: 03/02/2020 16:02   CT Abdomen Pelvis W Contrast  Result Date: 03/02/2020 CLINICAL DATA:  Non-small-cell lung cancer, staging. Diagnosed 12/20 with chemotherapy ongoing. Immunotherapy on going. Short of breath on exertion. Constipation. EXAM: CT CHEST, ABDOMEN, AND PELVIS WITH CONTRAST TECHNIQUE: Multidetector CT imaging of the chest, abdomen and pelvis was performed following the standard protocol during bolus administration of intravenous contrast. CONTRAST:  178mL OMNIPAQUE IOHEXOL 300 MG/ML  SOLN COMPARISON:  PET 12/09/2019. FINDINGS: CT CHEST FINDINGS Cardiovascular: Right Port-A-Cath tip at mid right atrium. Aortic and branch vessel atherosclerosis. Normal  heart size, without pericardial effusion. Multivessel coronary artery atherosclerosis. No central pulmonary embolism, on this non-dedicated study. Mediastinum/Nodes: Resolved borderline right supraclavicular adenopathy. Right paratracheal node measures 1.3 cm on 18/2 versus 2.0 cm on the prior exam. 1.5 cm subcarinal node on 26/2 measured 2.3 cm on the prior exam (when remeasured). No new thoracic adenopathy. Lungs/Pleura: No pleural fluid.  Mild centrilobular emphysema. Posterior right upper lobe lung mass measures 4.0 x 3.6 cm on 49/7. Somewhat difficult to compare to the prior PET, secondary to surrounding postobstructive pneumonitis on that exam. Felt to previously measure on the order of 5.7 x 4.8 cm (when remeasured). Resolution of previously described pulmonary nodules. Musculoskeletal: No acute osseous abnormality. CT ABDOMEN PELVIS FINDINGS Hepatobiliary: Possible mild hepatic steatosis. No focal liver lesion. Normal gallbladder, without biliary ductal dilatation. Pancreas: Normal, without mass or ductal dilatation. Spleen: Normal in size, without focal abnormality. Adrenals/Urinary Tract: Mild left adrenal nodularity is unchanged. A right adrenal nodule of 2.3 cm is similar in size on the prior PET, and low-density, most consistent with an adenoma. Upper pole right renal 1.6 cm cyst. Normal left kidney, without hydronephrosis. Normal urinary bladder. Stomach/Bowel: Normal stomach, without wall thickening. Normal colon, appendix, and terminal ileum. Normal small bowel. Vascular/Lymphatic: Advanced aortic and branch vessel atherosclerosis. No abdominopelvic adenopathy. Reproductive: Normal prostate. Other: Small fat containing right inguinal hernia. Musculoskeletal: No acute osseous abnormality. IMPRESSION: 1. Response to therapy of right upper lobe lung mass and thoracic adenopathy since 12/09/2019. 2. Resolution of pulmonary nodules, consistent with response to therapy of pulmonary metastasis. 3. No new or  progressive disease. 4. Right adrenal adenoma. 5. Aortic atherosclerosis (ICD10-I70.0), coronary artery atherosclerosis and emphysema (ICD10-J43.9). Electronically Signed   By: Abigail Miyamoto M.D.   On: 03/02/2020 16:02     ASSESSMENT/PLAN:  This is a very pleasant 64 year old African-American male recently diagnosed with stage IV non-small cell lung cancer, adenocarcinoma.  He presented with a large right upper lobe lung mass with right hilar, subcarinal, paratracheal lymphadenopathy, and right supraclavicular lymphadenopathy.  He also presented with bilateral pulmonary nodules.  He was diagnosed in February 2021.  He does not have any actionable mutations.  The patient is currently undergoing palliative systemic chemotherapy with carboplatin for an AUC of 5, Alimta 500 mg/m, Keytruda 200 mg IV every 3 weeks.  He is status post 3 cycles and he tolerated it well without any adverse side effects except for __.  The patient recently had a restaging CT scan performed.  Dr. Earlie Server personally and independently reviewed the scan and discussed the results with the patient today.  The patient scan showed a positive response to treatment.  Dr. Julien Nordmann recommends the patient continue on the same treatment at the same dose.  He will receive cycle #4 today scheduled.  We will see  the patient back for follow-up visit in 3 weeks for evaluation before starting cycle #5 treatment.   The patient was advised to call immediately if he has any concerning symptoms in the interval. The patient voices understanding of current disease status and treatment options and is in agreement with the current care plan. All questions were answered. The patient knows to call the clinic with any problems, questions or concerns. We can certainly see the patient much sooner if necessary     No orders of the defined types were placed in this encounter.    Hisako Bugh L Vuong Musa, PA-C 03/03/20

## 2020-03-04 ENCOUNTER — Inpatient Hospital Stay: Payer: No Typology Code available for payment source

## 2020-03-04 ENCOUNTER — Telehealth: Payer: Self-pay | Admitting: Internal Medicine

## 2020-03-04 ENCOUNTER — Inpatient Hospital Stay: Payer: No Typology Code available for payment source | Admitting: Physician Assistant

## 2020-03-04 NOTE — Telephone Encounter (Signed)
Called pt per 4/28 sch message - unable to reach pt. Left message for patient to call back

## 2020-03-05 ENCOUNTER — Telehealth: Payer: Self-pay | Admitting: Medical Oncology

## 2020-03-05 NOTE — Progress Notes (Deleted)
[facility] OFFICE PROGRESS NOTE  Kevin Lambert, [place]  [place] [id number]  DIAGNOSIS: Stage IV (T2b, N3, M1a) non-small cell lung cancer, adenocarcinoma presented with large right upper lobe lung mass in addition to mediastinal and right supraclavicular lymphadenopathy as well as bilateral pulmonary nodules diagnosed in February 2021.  MOLECULAR STUDY by [employer]:  KRASG12C,4.3%,Binimetinib  [id number],0.9%,Niraparib, Olaparib, Rucaparib,Talazoparib, Tazemetostat  [id number],1.4%,GYJE  PRIOR THERAPY: None  CURRENT THERAPY: Systemic chemotherapy with carboplatin for AUC of 5, Alimta 500 mg/M2 and Keytruda 200 mg IV every 3 weeks. First dose December 31, 2019. Status post 3 cycles  INTERVAL HISTORY: Kevin Lambert. 64 y.o. male returns to the clinic today for follow-up visit.  The patient is feeling __today without any concerning complaints except for __.  He is tolerating his treatment fairly well without any concerning adverse side effects except for __.  Today, he denies any recent fever, chills, night sweats, or weight loss.  He denies any hemoptysis or chest pain but reports his baseline shortness of breath with exertion.  The patient currently is smoking approximately __per day.  He denies any nausea, vomiting, or constipation.  He occasionally experiences constipation for which he takes docusate.  Denies any headache or visual changes.  He denies any rashes or skin changes.  The patient recently had a restaging CT scan performed.  He is here today for evaluation and to review his scan results before starting cycle #4.  MEDICAL HISTORY: Past Medical History:  Diagnosis Date  . Arthritis   . Depression   . DM (diabetes mellitus) ([place])    type II  . Dyslipidemia   . Dyspnea    unable to walk much - he thinks it is from   . GERD (gastroesophageal reflux disease)   . Hepatitis    Hepatitis C- treated  . History of kidney stones    . HTN (hypertension)   . Neuropathy   . nscl ca dx'd 10/2019  . OSA on CPAP   . PTSD (post-traumatic stress disorder)     ALLERGIES:  is allergic to metformin and related.  MEDICATIONS:  Current Outpatient Medications  Medication Sig Dispense Refill  . albuterol (VENTOLIN HFA) 108 (90 Base) MCG/ACT inhaler Inhale 2 puffs into the lungs every 4 (four) hours as needed for wheezing or shortness of breath. 18 g 3  . amLODipine (NORVASC) 10 MG tablet Take 10 mg by mouth daily.    Marland Kitchen aspirin EC 81 MG tablet Take 81 mg by mouth daily.    Marland Kitchen atorvastatin (LIPITOR) 10 MG tablet Take 10 mg by mouth daily.    . bisacodyl (DULCOLAX) 5 MG EC tablet Take 5 mg by mouth daily as needed for moderate constipation.    . calcium carbonate (TUMS - DOSED IN MG ELEMENTAL CALCIUM) 500 MG chewable tablet Chew 2 tablets by mouth daily as needed for indigestion or heartburn.    . folic acid (FOLVITE) 1 MG tablet Take 1 tablet (1 mg total) by mouth daily. Pt needs to pick up today and start ASAP . He will pay out of pocket. He is waiting on the [place] to send his folic acid which will be next week. 7 tablet 0  . glipiZIDE (GLUCOTROL) 5 MG tablet Take 5 mg by mouth daily before breakfast.     . HYDROcodone-acetaminophen (NORCO/VICODIN) 5-325 MG tablet Take 1 tablet by mouth every 6 (six) hours as needed for moderate pain.    Marland Kitchen lidocaine-prilocaine (EMLA) cream Apply  to Port-A-Cath site 30-60-minute before treatment. 30 g 0  . magic mouthwash SOLN Take 5 mLs by mouth 4 (four) times daily as needed for mouth pain. Swish and swallow or spit 240 mL 2  . Menthol ([employer] LOZENGES MT) Use as directed 1 lozenge in the mouth or throat daily as needed (throat).     . naproxen (NAPROSYN) 500 MG tablet Take 500 mg by mouth 2 (two) times daily with a meal.    . oxyCODONE-acetaminophen (PERCOCET/ROXICET) 5-325 MG tablet Take 1 tablet by mouth every 6 (six) hours as needed for severe pain. 12 tablet 0  . pregabalin (LYRICA) 25 MG capsule  Take 1 capsule (25 mg total) by mouth daily. 30 capsule 1  . psyllium (METAMUCIL) 58.6 % powder Take 1 packet by mouth daily.    . sildenafil (VIAGRA) 100 MG tablet Take 100 mg by mouth daily as needed for erectile dysfunction.    . triamterene-hydrochlorothiazide (MAXZIDE-25) 37.5-25 MG tablet Take 1 tablet by mouth daily as needed (swelling).     Marland Kitchen umeclidinium-vilanterol (ANORO ELLIPTA) 62.5-25 MCG/INH AEPB Inhale 1 puff into the lungs daily. 60 each 11  . umeclidinium-vilanterol (ANORO ELLIPTA) 62.5-25 MCG/INH AEPB Inhale 1 puff into the lungs daily. 7 each 0   No current facility-administered medications for this visit.    SURGICAL HISTORY:  Past Surgical History:  Procedure Laterality Date  . BIOPSY OF MEDIASTINAL MASS  12/10/2019   Procedure: BIOPSY OF MEDIASTINAL MASS;  Surgeon: Garner Nash, DO;  Location: [facility];  Service: Pulmonary;;  right upper lobe  . BRONCHIAL NEEDLE ASPIRATION BIOPSY  12/10/2019   Procedure: BRONCHIAL NEEDLE ASPIRATION BIOPSIES;  Surgeon: Garner Nash, DO;  Location: [facility];  Service: Pulmonary;;  . COLONOSCOPY    . IR IMAGING GUIDED PORT INSERTION  12/31/2019  . kidney stone    . URETERAL STENT PLACEMENT    . Ureteral Stent Removed    . VIDEO BRONCHOSCOPY WITH ENDOBRONCHIAL ULTRASOUND N/A 12/10/2019   Procedure: VIDEO BRONCHOSCOPY WITH ENDOBRONCHIAL ULTRASOUND;  Surgeon: Garner Nash, DO;  Location: [facility];  Service: Pulmonary;  Laterality: N/A;    REVIEW OF SYSTEMS:   Review of Systems  Constitutional: Negative for appetite change, chills, fatigue, fever and unexpected weight change.  HENT:   Negative for mouth sores, nosebleeds, sore throat and trouble swallowing.   Eyes: Negative for eye problems and icterus.  Respiratory: Negative for cough, hemoptysis, shortness of breath and wheezing.   Cardiovascular: Negative for chest pain and leg swelling.  Gastrointestinal: Negative for abdominal pain, constipation, diarrhea, nausea  and vomiting.  Genitourinary: Negative for bladder incontinence, difficulty urinating, dysuria, frequency and hematuria.   Musculoskeletal: Negative for back pain, gait problem, neck pain and neck stiffness.  Skin: Negative for itching and rash.  Neurological: Negative for dizziness, extremity weakness, gait problem, headaches, light-headedness and seizures.  Hematological: Negative for adenopathy. Does not bruise/bleed easily.  Psychiatric/Behavioral: Negative for confusion, depression and sleep disturbance. The patient is not nervous/anxious.     PHYSICAL EXAMINATION:  There were no vitals taken for this visit.  ECOG PERFORMANCE STATUS: {CHL ONC ECOG [id number]  Physical Exam  Constitutional: Oriented to person, place, and time and well-developed, well-nourished, and in no distress. No distress.  HENT:  Head: Normocephalic and atraumatic.  Mouth/Throat: Oropharynx is clear and moist. No oropharyngeal exudate.  Eyes: Conjunctivae are normal. Right eye exhibits no discharge. Left eye exhibits no discharge. No scleral icterus.  Neck: Normal range of motion. Neck supple.  Cardiovascular:  Normal rate, regular rhythm, normal heart sounds and intact distal pulses.   Pulmonary/Chest: Effort normal and breath sounds normal. No respiratory distress. No wheezes. No rales.  Abdominal: Soft. Bowel sounds are normal. Exhibits no distension and no mass. There is no tenderness.  Musculoskeletal: Normal range of motion. Exhibits no edema.  Lymphadenopathy:    No cervical adenopathy.  Neurological: Alert and oriented to person, place, and time. Exhibits normal muscle tone. Gait normal. Coordination normal.  Skin: Skin is warm and dry. No rash noted. Not diaphoretic. No erythema. No pallor.  Psychiatric: Mood, memory and judgment normal.  Vitals reviewed.  LABORATORY DATA: Lab Results  Component Value Date   WBC 4.5 02/26/2020   HGB 11.9 (L) 02/26/2020   HCT 35.1 (L) 02/26/2020   MCV 93.6  02/26/2020   PLT 98 (L) 02/26/2020      Chemistry      Component Value Date/Time   NA 134 (L) 02/26/2020 1234   K 4.3 02/26/2020 1234   CL 97 (L) 02/26/2020 1234   CO2 26 02/26/2020 1234   BUN 12 02/26/2020 1234   CREATININE 1.00 02/26/2020 1234      Component Value Date/Time   CALCIUM 9.5 02/26/2020 1234   ALKPHOS 107 02/26/2020 1234   AST 22 02/26/2020 1234   ALT 55 (H) 02/26/2020 1234   BILITOT <0.2 (L) 02/26/2020 1234       RADIOGRAPHIC STUDIES:  CT Chest W Contrast  Result Date: 03/02/2020 CLINICAL DATA:  Non-small-cell lung cancer, staging. Diagnosed 12/20 with chemotherapy ongoing. Immunotherapy on going. Short of breath on exertion. Constipation. EXAM: CT CHEST, ABDOMEN, AND PELVIS WITH CONTRAST TECHNIQUE: Multidetector CT imaging of the chest, abdomen and pelvis was performed following the standard protocol during bolus administration of intravenous contrast. CONTRAST:  115mL OMNIPAQUE IOHEXOL 300 MG/ML  SOLN COMPARISON:  PET 12/09/2019. FINDINGS: CT CHEST FINDINGS Cardiovascular: Right Port-A-Cath tip at mid right atrium. Aortic and branch vessel atherosclerosis. Normal heart size, without pericardial effusion. Multivessel coronary artery atherosclerosis. No central pulmonary embolism, on this non-dedicated study. Mediastinum/Nodes: Resolved borderline right supraclavicular adenopathy. Right paratracheal node measures 1.3 cm on 18/2 versus 2.0 cm on the prior exam. 1.5 cm subcarinal node on 26/2 measured 2.3 cm on the prior exam (when remeasured). No new thoracic adenopathy. Lungs/Pleura: No pleural fluid.  Mild centrilobular emphysema. Posterior right upper lobe lung mass measures 4.0 x 3.6 cm on 49/7. Somewhat difficult to compare to the prior PET, secondary to surrounding postobstructive pneumonitis on that exam. Felt to previously measure on the order of 5.7 x 4.8 cm (when remeasured). Resolution of previously described pulmonary nodules. Musculoskeletal: No acute osseous  abnormality. CT ABDOMEN PELVIS FINDINGS Hepatobiliary: Possible mild hepatic steatosis. No focal liver lesion. Normal gallbladder, without biliary ductal dilatation. Pancreas: Normal, without mass or ductal dilatation. Spleen: Normal in size, without focal abnormality. Adrenals/Urinary Tract: Mild left adrenal nodularity is unchanged. A right adrenal nodule of 2.3 cm is similar in size on the prior PET, and low-density, most consistent with an adenoma. Upper pole right renal 1.6 cm cyst. Normal left kidney, without hydronephrosis. Normal urinary bladder. Stomach/Bowel: Normal stomach, without wall thickening. Normal colon, appendix, and terminal ileum. Normal small bowel. Vascular/Lymphatic: Advanced aortic and branch vessel atherosclerosis. No abdominopelvic adenopathy. Reproductive: Normal prostate. Other: Small fat containing right inguinal hernia. Musculoskeletal: No acute osseous abnormality. IMPRESSION: 1. Response to therapy of right upper lobe lung mass and thoracic adenopathy since 12/09/2019. 2. Resolution of pulmonary nodules, consistent with response to therapy of pulmonary  metastasis. 3. No new or progressive disease. 4. Right adrenal adenoma. 5. Aortic atherosclerosis (ICD10-I70.0), coronary artery atherosclerosis and emphysema (ICD10-J43.9). Electronically Signed   By: Abigail Miyamoto M.D.   On: 03/02/2020 16:02   CT Abdomen Pelvis W Contrast  Result Date: 03/02/2020 CLINICAL DATA:  Non-small-cell lung cancer, staging. Diagnosed 12/20 with chemotherapy ongoing. Immunotherapy on going. Short of breath on exertion. Constipation. EXAM: CT CHEST, ABDOMEN, AND PELVIS WITH CONTRAST TECHNIQUE: Multidetector CT imaging of the chest, abdomen and pelvis was performed following the standard protocol during bolus administration of intravenous contrast. CONTRAST:  14mL OMNIPAQUE IOHEXOL 300 MG/ML  SOLN COMPARISON:  PET 12/09/2019. FINDINGS: CT CHEST FINDINGS Cardiovascular: Right Port-A-Cath tip at mid right  atrium. Aortic and branch vessel atherosclerosis. Normal heart size, without pericardial effusion. Multivessel coronary artery atherosclerosis. No central pulmonary embolism, on this non-dedicated study. Mediastinum/Nodes: Resolved borderline right supraclavicular adenopathy. Right paratracheal node measures 1.3 cm on 18/2 versus 2.0 cm on the prior exam. 1.5 cm subcarinal node on 26/2 measured 2.3 cm on the prior exam (when remeasured). No new thoracic adenopathy. Lungs/Pleura: No pleural fluid.  Mild centrilobular emphysema. Posterior right upper lobe lung mass measures 4.0 x 3.6 cm on 49/7. Somewhat difficult to compare to the prior PET, secondary to surrounding postobstructive pneumonitis on that exam. Felt to previously measure on the order of 5.7 x 4.8 cm (when remeasured). Resolution of previously described pulmonary nodules. Musculoskeletal: No acute osseous abnormality. CT ABDOMEN PELVIS FINDINGS Hepatobiliary: Possible mild hepatic steatosis. No focal liver lesion. Normal gallbladder, without biliary ductal dilatation. Pancreas: Normal, without mass or ductal dilatation. Spleen: Normal in size, without focal abnormality. Adrenals/Urinary Tract: Mild left adrenal nodularity is unchanged. A right adrenal nodule of 2.3 cm is similar in size on the prior PET, and low-density, most consistent with an adenoma. Upper pole right renal 1.6 cm cyst. Normal left kidney, without hydronephrosis. Normal urinary bladder. Stomach/Bowel: Normal stomach, without wall thickening. Normal colon, appendix, and terminal ileum. Normal small bowel. Vascular/Lymphatic: Advanced aortic and branch vessel atherosclerosis. No abdominopelvic adenopathy. Reproductive: Normal prostate. Other: Small fat containing right inguinal hernia. Musculoskeletal: No acute osseous abnormality. IMPRESSION: 1. Response to therapy of right upper lobe lung mass and thoracic adenopathy since 12/09/2019. 2. Resolution of pulmonary nodules, consistent with  response to therapy of pulmonary metastasis. 3. No new or progressive disease. 4. Right adrenal adenoma. 5. Aortic atherosclerosis (ICD10-I70.0), coronary artery atherosclerosis and emphysema (ICD10-J43.9). Electronically Signed   By: Abigail Miyamoto M.D.   On: 03/02/2020 16:02     ASSESSMENT/PLAN:  This is a very pleasant 64 year old African-American male recently diagnosed with stage IV non-small cell lung cancer, adenocarcinoma.  He presented with a large right upper lobe lung mass with right hilar, subcarinal, paratracheal lymphadenopathy, and right supraclavicular lymphadenopathy.  He also presented with bilateral pulmonary nodules.  He was diagnosed in February 2021.  He does not have any actionable mutations.  The patient is currently undergoing palliative systemic chemotherapy with carboplatin for an AUC of 5, Alimta 500 mg/m, Keytruda 200 mg IV every 3 weeks.  He is status post 3 cycles and he tolerated it well without any adverse side effects.  The patient recently had a restaging CT scan performed.  Dr. Earlie Server personally and independently reviewed the scan and discussed the results with the patient today.  The scan showed an interval response to treatment with improvement in the right upper lobe lung mass, thoracic adenopathy, and pulmonary nodules.  Dr. Earlie Server recommends that the patient continue on  the same treatment at the same dose.  We will see him back for follow-up visit in 3 weeks for evaluation before starting cycle #5.  The patient was advised to call immediately if she has any concerning symptoms in the interval. The patient voices understanding of current disease status and treatment options and is in agreement with the current care plan. All questions were answered. The patient knows to call the clinic with any problems, questions or concerns. We can certainly see the patient much sooner if necessary      No orders of the defined types were placed in this  encounter.    Areli Jowett L Laretta Pyatt, PA-C 03/05/20

## 2020-03-05 NOTE — Telephone Encounter (Signed)
Could not make appts yesterday , his phone is disabled and he cannot get messages about appts He called to r/s and to call his sister , Cinda , with appt.

## 2020-03-05 NOTE — Telephone Encounter (Signed)
Early next week with me or Cassie.  Thank you

## 2020-03-06 ENCOUNTER — Telehealth: Payer: Self-pay | Admitting: Physician Assistant

## 2020-03-06 ENCOUNTER — Inpatient Hospital Stay: Payer: No Typology Code available for payment source | Admitting: Physician Assistant

## 2020-03-06 ENCOUNTER — Inpatient Hospital Stay: Payer: No Typology Code available for payment source

## 2020-03-06 NOTE — Telephone Encounter (Signed)
R/s appt per 4/30 sch message - pt sister aware of new apt date and time

## 2020-03-06 NOTE — Telephone Encounter (Signed)
The patient did not show up for his appointment today. Called the patient and left voicemail asking him to call back to reschedule his appointment for early next week.

## 2020-03-09 ENCOUNTER — Other Ambulatory Visit: Payer: Self-pay

## 2020-03-09 ENCOUNTER — Inpatient Hospital Stay: Payer: No Typology Code available for payment source | Attending: Internal Medicine

## 2020-03-09 ENCOUNTER — Encounter: Payer: Self-pay | Admitting: Internal Medicine

## 2020-03-09 ENCOUNTER — Inpatient Hospital Stay (HOSPITAL_BASED_OUTPATIENT_CLINIC_OR_DEPARTMENT_OTHER): Payer: No Typology Code available for payment source | Admitting: Internal Medicine

## 2020-03-09 ENCOUNTER — Inpatient Hospital Stay: Payer: No Typology Code available for payment source

## 2020-03-09 VITALS — BP 140/82 | HR 94 | Temp 98.3°F | Resp 20 | Ht 69.0 in | Wt 240.2 lb

## 2020-03-09 DIAGNOSIS — E119 Type 2 diabetes mellitus without complications: Secondary | ICD-10-CM | POA: Diagnosis not present

## 2020-03-09 DIAGNOSIS — I10 Essential (primary) hypertension: Secondary | ICD-10-CM | POA: Diagnosis not present

## 2020-03-09 DIAGNOSIS — C3491 Malignant neoplasm of unspecified part of right bronchus or lung: Secondary | ICD-10-CM | POA: Diagnosis not present

## 2020-03-09 DIAGNOSIS — Z5112 Encounter for antineoplastic immunotherapy: Secondary | ICD-10-CM | POA: Insufficient documentation

## 2020-03-09 DIAGNOSIS — Z5111 Encounter for antineoplastic chemotherapy: Secondary | ICD-10-CM

## 2020-03-09 DIAGNOSIS — C3411 Malignant neoplasm of upper lobe, right bronchus or lung: Secondary | ICD-10-CM | POA: Diagnosis present

## 2020-03-09 DIAGNOSIS — Z720 Tobacco use: Secondary | ICD-10-CM

## 2020-03-09 DIAGNOSIS — Z452 Encounter for adjustment and management of vascular access device: Secondary | ICD-10-CM | POA: Diagnosis not present

## 2020-03-09 DIAGNOSIS — Z79899 Other long term (current) drug therapy: Secondary | ICD-10-CM | POA: Diagnosis not present

## 2020-03-09 LAB — CMP (CANCER CENTER ONLY)
ALT: 134 U/L — ABNORMAL HIGH (ref 0–44)
AST: 56 U/L — ABNORMAL HIGH (ref 15–41)
Albumin: 3.6 g/dL (ref 3.5–5.0)
Alkaline Phosphatase: 145 U/L — ABNORMAL HIGH (ref 38–126)
Anion gap: 12 (ref 5–15)
BUN: 8 mg/dL (ref 8–23)
CO2: 27 mmol/L (ref 22–32)
Calcium: 9.9 mg/dL (ref 8.9–10.3)
Chloride: 95 mmol/L — ABNORMAL LOW (ref 98–111)
Creatinine: 0.89 mg/dL (ref 0.61–1.24)
GFR, Est AFR Am: >60 mL/min (ref >60)
GFR, Estimated: >60 mL/min (ref >60)
Glucose, Bld: 243 mg/dL — ABNORMAL HIGH (ref 70–99)
Potassium: 4.3 mmol/L (ref 3.5–5.1)
Sodium: 134 mmol/L — ABNORMAL LOW (ref 135–145)
Total Bilirubin: 0.3 mg/dL (ref 0.3–1.2)
Total Protein: 7 g/dL (ref 6.5–8.1)

## 2020-03-09 LAB — CBC WITH DIFFERENTIAL (CANCER CENTER ONLY)
Abs Immature Granulocytes: 0.07 10*3/uL (ref 0.00–0.07)
Basophils Absolute: 0 10*3/uL (ref 0.0–0.1)
Basophils Relative: 0 %
Eosinophils Absolute: 0.1 10*3/uL (ref 0.0–0.5)
Eosinophils Relative: 2 %
HCT: 36.5 % — ABNORMAL LOW (ref 39.0–52.0)
Hemoglobin: 12.5 g/dL — ABNORMAL LOW (ref 13.0–17.0)
Immature Granulocytes: 1 %
Lymphocytes Relative: 37 %
Lymphs Abs: 1.9 10*3/uL (ref 0.7–4.0)
MCH: 31.8 pg (ref 26.0–34.0)
MCHC: 34.2 g/dL (ref 30.0–36.0)
MCV: 92.9 fL (ref 80.0–100.0)
Monocytes Absolute: 0.7 10*3/uL (ref 0.1–1.0)
Monocytes Relative: 14 %
Neutro Abs: 2.3 10*3/uL (ref 1.7–7.7)
Neutrophils Relative %: 46 %
Platelet Count: 192 10*3/uL (ref 150–400)
RBC: 3.93 MIL/uL — ABNORMAL LOW (ref 4.22–5.81)
RDW: 16.5 % — ABNORMAL HIGH (ref 11.5–15.5)
WBC Count: 5.1 10*3/uL (ref 4.0–10.5)
nRBC: 0 % (ref 0.0–0.2)

## 2020-03-09 MED ORDER — PALONOSETRON HCL INJECTION 0.25 MG/5ML
0.2500 mg | Freq: Once | INTRAVENOUS | Status: AC
  Administered 2020-03-09: 0.25 mg via INTRAVENOUS

## 2020-03-09 MED ORDER — PALONOSETRON HCL INJECTION 0.25 MG/5ML
INTRAVENOUS | Status: AC
  Filled 2020-03-09: qty 5

## 2020-03-09 MED ORDER — SODIUM CHLORIDE 0.9 % IV SOLN
750.0000 mg | Freq: Once | INTRAVENOUS | Status: AC
  Administered 2020-03-09: 750 mg via INTRAVENOUS
  Filled 2020-03-09: qty 75

## 2020-03-09 MED ORDER — SODIUM CHLORIDE 0.9 % IV SOLN
200.0000 mg | Freq: Once | INTRAVENOUS | Status: AC
  Administered 2020-03-09: 200 mg via INTRAVENOUS
  Filled 2020-03-09: qty 8

## 2020-03-09 MED ORDER — SODIUM CHLORIDE 0.9 % IV SOLN
500.0000 mg/m2 | Freq: Once | INTRAVENOUS | Status: AC
  Administered 2020-03-09: 1200 mg via INTRAVENOUS
  Filled 2020-03-09: qty 8

## 2020-03-09 MED ORDER — SODIUM CHLORIDE 0.9 % IV SOLN
10.0000 mg | Freq: Once | INTRAVENOUS | Status: AC
  Administered 2020-03-09: 10 mg via INTRAVENOUS
  Filled 2020-03-09: qty 10

## 2020-03-09 MED ORDER — SODIUM CHLORIDE 0.9% FLUSH
10.0000 mL | INTRAVENOUS | Status: DC | PRN
  Administered 2020-03-09: 10 mL
  Filled 2020-03-09: qty 10

## 2020-03-09 MED ORDER — SODIUM CHLORIDE 0.9 % IV SOLN
Freq: Once | INTRAVENOUS | Status: AC
  Filled 2020-03-09: qty 250

## 2020-03-09 MED ORDER — HEPARIN SOD (PORK) LOCK FLUSH 100 UNIT/ML IV SOLN
500.0000 [IU] | Freq: Once | INTRAVENOUS | Status: AC | PRN
  Administered 2020-03-09: 500 [IU]
  Filled 2020-03-09: qty 5

## 2020-03-09 MED ORDER — DEXAMETHASONE SODIUM PHOSPHATE 10 MG/ML IJ SOLN
INTRAMUSCULAR | Status: AC
  Filled 2020-03-09: qty 1

## 2020-03-09 MED ORDER — SODIUM CHLORIDE 0.9 % IV SOLN
150.0000 mg | Freq: Once | INTRAVENOUS | Status: AC
  Administered 2020-03-09: 150 mg via INTRAVENOUS
  Filled 2020-03-09: qty 150

## 2020-03-09 NOTE — Progress Notes (Signed)
[place] [phone]   [phone]  OFFICE PROGRESS NOTE  [name], MD [address] [place]  [place] [address]  DIAGNOSIS: Stage IV (T2b, N3, M1a) non-small cell lung cancer, adenocarcinoma presented with large right upper lobe lung mass in addition to mediastinal and right supraclavicular lymphadenopathy as well as bilateral pulmonary nodules diagnosed in February 2021.  MOLECULAR STUDY by [employer]:  KRASG12C, 4.3%, Binimetinib  [id number], 0.9%, Niraparib, Olaparib, Rucaparib,Talazoparib, Tazemetostat  [id number], 1.7%, None   PRIOR THERAPY: None  CURRENT THERAPY: Systemic chemotherapy with carboplatin for AUC of 5, Alimta 500 mg/M2 and Keytruda 200 mg IV every 3 weeks.  First dose December 31, 2019.  Status post 3 cycles.  INTERVAL HISTORY: Kevin Lambert. 63 y.o. male returns to the clinic today for follow-up visit.  The patient is feeling fine today with no concerning complaints.  He had few episodes of diarrhea last week and he missed his appointment.  He denied having any current chest pain, shortness of breath, cough or hemoptysis.  He denied having any fever or chills.  He has no nausea, vomiting, diarrhea or constipation.  He has no headache or visual changes.  He has been tolerating his systemic chemotherapy fairly well.  The patient had repeat CT scan of the chest, abdomen pelvis performed recently and he is here for evaluation and discussion of his discuss results.  MEDICAL HISTORY: Past Medical History:  Diagnosis Date  . Arthritis   . Depression   . DM (diabetes mellitus) ([place])    type II  . Dyslipidemia   . Dyspnea    unable to walk much - he thinks it is from   . GERD (gastroesophageal reflux disease)   . Hepatitis    Hepatitis C- treated  . History of kidney stones   . HTN (hypertension)   . Neuropathy   . nscl ca dx'd 10/2019  . OSA on CPAP   . PTSD (post-traumatic stress disorder)     ALLERGIES:   is allergic to metformin and related.  MEDICATIONS:  Current Outpatient Medications  Medication Sig Dispense Refill  . amLODipine (NORVASC) 10 MG tablet Take 10 mg by mouth daily.    Marland Kitchen aspirin EC 81 MG tablet Take 81 mg by mouth daily.    Marland Kitchen atorvastatin (LIPITOR) 10 MG tablet Take 10 mg by mouth daily.    . bisacodyl (DULCOLAX) 5 MG EC tablet Take 5 mg by mouth daily as needed for moderate constipation.    . calcium carbonate (TUMS - DOSED IN MG ELEMENTAL CALCIUM) 500 MG chewable tablet Chew 2 tablets by mouth daily as needed for indigestion or heartburn.    . folic acid (FOLVITE) 1 MG tablet Take 1 tablet (1 mg total) by mouth daily. Pt needs to pick up today and start ASAP . He will pay out of pocket. He is waiting on the [place] to send his folic acid which will be next week. 7 tablet 0  . HYDROcodone-acetaminophen (NORCO/VICODIN) 5-325 MG tablet Take 1 tablet by mouth every 6 (six) hours as needed for moderate pain.    Marland Kitchen lidocaine-prilocaine (EMLA) cream Apply to Port-A-Cath site 30-60-minute before treatment. 30 g 0  . magic mouthwash SOLN Take 5 mLs by mouth 4 (four) times daily as needed for mouth pain. Swish and swallow or spit 240 mL 2  . Menthol ([employer] LOZENGES MT) Use as directed 1 lozenge in the mouth or throat daily as needed (throat).     Marland Kitchen  naproxen (NAPROSYN) 500 MG tablet Take 500 mg by mouth 2 (two) times daily with a meal.    . pregabalin (LYRICA) 25 MG capsule Take 1 capsule (25 mg total) by mouth daily. 30 capsule 1  . psyllium (METAMUCIL) 58.6 % powder Take 1 packet by mouth daily.    . sildenafil (VIAGRA) 100 MG tablet Take 100 mg by mouth daily as needed for erectile dysfunction.    . triamterene-hydrochlorothiazide (MAXZIDE-25) 37.5-25 MG tablet Take 1 tablet by mouth daily as needed (swelling).     Marland Kitchen albuterol (VENTOLIN HFA) 108 (90 Base) MCG/ACT inhaler Inhale 2 puffs into the lungs every 4 (four) hours as needed for wheezing or shortness of breath. (Patient not taking:  Reported on 03/09/2020) 18 g 3  . glipiZIDE (GLUCOTROL) 5 MG tablet Take 5 mg by mouth daily before breakfast.     . oxyCODONE-acetaminophen (PERCOCET/ROXICET) 5-325 MG tablet Take 1 tablet by mouth every 6 (six) hours as needed for severe pain. (Patient not taking: Reported on 03/09/2020) 12 tablet 0  . umeclidinium-vilanterol (ANORO ELLIPTA) 62.5-25 MCG/INH AEPB Inhale 1 puff into the lungs daily. (Patient not taking: Reported on 03/09/2020) 60 each 11   No current facility-administered medications for this visit.    SURGICAL HISTORY:  Past Surgical History:  Procedure Laterality Date  . BIOPSY OF MEDIASTINAL MASS  12/10/2019   Procedure: BIOPSY OF MEDIASTINAL MASS;  Surgeon: Garner Nash, DO;  Location: [facility];  Service: Pulmonary;;  right upper lobe  . BRONCHIAL NEEDLE ASPIRATION BIOPSY  12/10/2019   Procedure: BRONCHIAL NEEDLE ASPIRATION BIOPSIES;  Surgeon: Garner Nash, DO;  Location: [facility];  Service: Pulmonary;;  . COLONOSCOPY    . IR IMAGING GUIDED PORT INSERTION  12/31/2019  . kidney stone    . URETERAL STENT PLACEMENT    . Ureteral Stent Removed    . VIDEO BRONCHOSCOPY WITH ENDOBRONCHIAL ULTRASOUND N/A 12/10/2019   Procedure: VIDEO BRONCHOSCOPY WITH ENDOBRONCHIAL ULTRASOUND;  Surgeon: Garner Nash, DO;  Location: [facility];  Service: Pulmonary;  Laterality: N/A;    REVIEW OF SYSTEMS:  Constitutional: positive for fatigue Eyes: negative Ears, nose, mouth, throat, and face: negative Respiratory: positive for dyspnea on exertion Cardiovascular: negative Gastrointestinal: negative Genitourinary:negative Integument/breast: negative Hematologic/lymphatic: negative Musculoskeletal:negative Neurological: negative Behavioral/Psych: negative Endocrine: negative Allergic/Immunologic: negative   PHYSICAL EXAMINATION: General appearance: alert, cooperative and no distress Head: Normocephalic, without obvious abnormality, atraumatic Neck: no adenopathy, no JVD,  supple, symmetrical, trachea midline and thyroid not enlarged, symmetric, no tenderness/mass/nodules Lymph nodes: Cervical, supraclavicular, and axillary nodes normal. Resp: clear to auscultation bilaterally Back: symmetric, no curvature. ROM normal. No CVA tenderness. Cardio: regular rate and rhythm, S1, S2 normal, no murmur, click, rub or gallop GI: soft, non-tender; bowel sounds normal; no masses,  no organomegaly Extremities: extremities normal, atraumatic, no cyanosis or edema Neurologic: Alert and oriented X 3, normal strength and tone. Normal symmetric reflexes. Normal coordination and gait  ECOG PERFORMANCE STATUS: 1 - Symptomatic but completely ambulatory  Blood pressure 140/82, pulse 94, temperature 98.3 F (36.8 C), temperature source Temporal, resp. rate 20, height 5\' 9"  (1.753 m), weight 240 lb 3.2 oz (109 kg), SpO2 100 %.  LABORATORY DATA: Lab Results  Component Value Date   WBC 5.1 03/09/2020   HGB 12.5 (L) 03/09/2020   HCT 36.5 (L) 03/09/2020   MCV 92.9 03/09/2020   PLT 192 03/09/2020      Chemistry      Component Value Date/Time   NA 134 (L) 03/09/2020 1324  K 4.3 03/09/2020 0941   CL 95 (L) 03/09/2020 0941   CO2 27 03/09/2020 0941   BUN 8 03/09/2020 0941   CREATININE 0.89 03/09/2020 0941      Component Value Date/Time   CALCIUM 9.9 03/09/2020 0941   ALKPHOS 145 (H) 03/09/2020 0941   AST 56 (H) 03/09/2020 0941   ALT 134 (H) 03/09/2020 0941   BILITOT 0.3 03/09/2020 0941       RADIOGRAPHIC STUDIES: CT Chest W Contrast  Result Date: 03/02/2020 CLINICAL DATA:  Non-small-cell lung cancer, staging. Diagnosed 12/20 with chemotherapy ongoing. Immunotherapy on going. Short of breath on exertion. Constipation. EXAM: CT CHEST, ABDOMEN, AND PELVIS WITH CONTRAST TECHNIQUE: Multidetector CT imaging of the chest, abdomen and pelvis was performed following the standard protocol during bolus administration of intravenous contrast. CONTRAST:  123mL OMNIPAQUE IOHEXOL 300  MG/ML  SOLN COMPARISON:  PET 12/09/2019. FINDINGS: CT CHEST FINDINGS Cardiovascular: Right Port-A-Cath tip at mid right atrium. Aortic and branch vessel atherosclerosis. Normal heart size, without pericardial effusion. Multivessel coronary artery atherosclerosis. No central pulmonary embolism, on this non-dedicated study. Mediastinum/Nodes: Resolved borderline right supraclavicular adenopathy. Right paratracheal node measures 1.3 cm on 18/2 versus 2.0 cm on the prior exam. 1.5 cm subcarinal node on 26/2 measured 2.3 cm on the prior exam (when remeasured). No new thoracic adenopathy. Lungs/Pleura: No pleural fluid.  Mild centrilobular emphysema. Posterior right upper lobe lung mass measures 4.0 x 3.6 cm on 49/7. Somewhat difficult to compare to the prior PET, secondary to surrounding postobstructive pneumonitis on that exam. Felt to previously measure on the order of 5.7 x 4.8 cm (when remeasured). Resolution of previously described pulmonary nodules. Musculoskeletal: No acute osseous abnormality. CT ABDOMEN PELVIS FINDINGS Hepatobiliary: Possible mild hepatic steatosis. No focal liver lesion. Normal gallbladder, without biliary ductal dilatation. Pancreas: Normal, without mass or ductal dilatation. Spleen: Normal in size, without focal abnormality. Adrenals/Urinary Tract: Mild left adrenal nodularity is unchanged. A right adrenal nodule of 2.3 cm is similar in size on the prior PET, and low-density, most consistent with an adenoma. Upper pole right renal 1.6 cm cyst. Normal left kidney, without hydronephrosis. Normal urinary bladder. Stomach/Bowel: Normal stomach, without wall thickening. Normal colon, appendix, and terminal ileum. Normal small bowel. Vascular/Lymphatic: Advanced aortic and branch vessel atherosclerosis. No abdominopelvic adenopathy. Reproductive: Normal prostate. Other: Small fat containing right inguinal hernia. Musculoskeletal: No acute osseous abnormality. IMPRESSION: 1. Response to therapy of  right upper lobe lung mass and thoracic adenopathy since 12/09/2019. 2. Resolution of pulmonary nodules, consistent with response to therapy of pulmonary metastasis. 3. No new or progressive disease. 4. Right adrenal adenoma. 5. Aortic atherosclerosis (ICD10-I70.0), coronary artery atherosclerosis and emphysema (ICD10-J43.9). Electronically Signed   By: Abigail Miyamoto M.D.   On: 03/02/2020 16:02   CT Abdomen Pelvis W Contrast  Result Date: 03/02/2020 CLINICAL DATA:  Non-small-cell lung cancer, staging. Diagnosed 12/20 with chemotherapy ongoing. Immunotherapy on going. Short of breath on exertion. Constipation. EXAM: CT CHEST, ABDOMEN, AND PELVIS WITH CONTRAST TECHNIQUE: Multidetector CT imaging of the chest, abdomen and pelvis was performed following the standard protocol during bolus administration of intravenous contrast. CONTRAST:  146mL OMNIPAQUE IOHEXOL 300 MG/ML  SOLN COMPARISON:  PET 12/09/2019. FINDINGS: CT CHEST FINDINGS Cardiovascular: Right Port-A-Cath tip at mid right atrium. Aortic and branch vessel atherosclerosis. Normal heart size, without pericardial effusion. Multivessel coronary artery atherosclerosis. No central pulmonary embolism, on this non-dedicated study. Mediastinum/Nodes: Resolved borderline right supraclavicular adenopathy. Right paratracheal node measures 1.3 cm on 18/2 versus 2.0 cm on the prior  exam. 1.5 cm subcarinal node on 26/2 measured 2.3 cm on the prior exam (when remeasured). No new thoracic adenopathy. Lungs/Pleura: No pleural fluid.  Mild centrilobular emphysema. Posterior right upper lobe lung mass measures 4.0 x 3.6 cm on 49/7. Somewhat difficult to compare to the prior PET, secondary to surrounding postobstructive pneumonitis on that exam. Felt to previously measure on the order of 5.7 x 4.8 cm (when remeasured). Resolution of previously described pulmonary nodules. Musculoskeletal: No acute osseous abnormality. CT ABDOMEN PELVIS FINDINGS Hepatobiliary: Possible mild  hepatic steatosis. No focal liver lesion. Normal gallbladder, without biliary ductal dilatation. Pancreas: Normal, without mass or ductal dilatation. Spleen: Normal in size, without focal abnormality. Adrenals/Urinary Tract: Mild left adrenal nodularity is unchanged. A right adrenal nodule of 2.3 cm is similar in size on the prior PET, and low-density, most consistent with an adenoma. Upper pole right renal 1.6 cm cyst. Normal left kidney, without hydronephrosis. Normal urinary bladder. Stomach/Bowel: Normal stomach, without wall thickening. Normal colon, appendix, and terminal ileum. Normal small bowel. Vascular/Lymphatic: Advanced aortic and branch vessel atherosclerosis. No abdominopelvic adenopathy. Reproductive: Normal prostate. Other: Small fat containing right inguinal hernia. Musculoskeletal: No acute osseous abnormality. IMPRESSION: 1. Response to therapy of right upper lobe lung mass and thoracic adenopathy since 12/09/2019. 2. Resolution of pulmonary nodules, consistent with response to therapy of pulmonary metastasis. 3. No new or progressive disease. 4. Right adrenal adenoma. 5. Aortic atherosclerosis (ICD10-I70.0), coronary artery atherosclerosis and emphysema (ICD10-J43.9). Electronically Signed   By: Abigail Miyamoto M.D.   On: 03/02/2020 16:02    ASSESSMENT AND PLAN: This is a very pleasant 64 years old African-American male recently diagnosed with a stage IV non-small cell lung cancer, adenocarcinoma with no actionable mutations presented with large right lower lobe lung mass in addition to mediastinal and right supraclavicular lymphadenopathy as well as bilateral pulmonary nodules diagnosed in February 2021. I explained to the patient that he has incurable condition and all the treatment options will be of palliative nature. The patient has no actionable mutations on the recent molecular studies and he is not a candidate for the [facility] clinical trial. He started systemic chemotherapy with  carboplatin, Alimta and Keytruda status post 3 cycles.   The patient has been tolerating his treatment well with no concerning adverse effects. He had repeat CT scan of the chest, abdomen pelvis performed recently.  I personally and independently reviewed the scans and discussed the results with the patient today. His scan showed no concerning findings for disease progression and actually there is improvement of his disease in the lung mass as well as the mediastinal lymphadenopathy. I recommended for the patient to continue his current treatment and he will proceed with cycle #4 today. We will see him back for follow-up visit in 3 weeks for evaluation before starting cycle #5. For the hypertension he was advised to take his blood pressure medication as prescribed and to monitor it closely at home. He was advised to call immediately if he has any other concerning symptoms in the interval. The patient voices understanding of current disease status and treatment options and is in agreement with the current care plan.  All questions were answered. The patient knows to call the clinic with any problems, questions or concerns. We can certainly see the patient much sooner if necessary.  Disclaimer: This note was dictated with voice recognition software. Similar sounding words can inadvertently be transcribed and may not be corrected upon review.

## 2020-03-09 NOTE — Patient Instructions (Signed)
[facility] Discharge Instructions for Patients Receiving Chemotherapy  Today you received the following chemotherapy agents Keytruda, Alimta and Carboplatin   To help prevent nausea and vomiting after your treatment, we encourage you to take your nausea medication as directed.    If you develop nausea and vomiting that is not controlled by your nausea medication, call the clinic.   BELOW ARE SYMPTOMS THAT SHOULD BE REPORTED IMMEDIATELY:  *FEVER GREATER THAN 100.5 F  *CHILLS WITH OR WITHOUT FEVER  NAUSEA AND VOMITING THAT IS NOT CONTROLLED WITH YOUR NAUSEA MEDICATION  *UNUSUAL SHORTNESS OF BREATH  *UNUSUAL BRUISING OR BLEEDING  TENDERNESS IN MOUTH AND THROAT WITH OR WITHOUT PRESENCE OF ULCERS  *URINARY PROBLEMS  *BOWEL PROBLEMS  UNUSUAL RASH Items with * indicate a potential emergency and should be followed up as soon as possible.  Feel free to call the clinic should you have any questions or concerns. The clinic phone number is [phone].  Please show the [facility] at check-in to the Emergency Department and triage nurse.

## 2020-03-11 ENCOUNTER — Other Ambulatory Visit: Payer: Non-veteran care

## 2020-03-11 ENCOUNTER — Telehealth: Payer: Self-pay | Admitting: Internal Medicine

## 2020-03-11 NOTE — Telephone Encounter (Signed)
Scheduled per los. Called and left msg. Mailed printout  °

## 2020-03-18 ENCOUNTER — Other Ambulatory Visit: Payer: Self-pay

## 2020-03-18 ENCOUNTER — Inpatient Hospital Stay: Payer: No Typology Code available for payment source

## 2020-03-18 DIAGNOSIS — C3491 Malignant neoplasm of unspecified part of right bronchus or lung: Secondary | ICD-10-CM

## 2020-03-18 DIAGNOSIS — Z95828 Presence of other vascular implants and grafts: Secondary | ICD-10-CM

## 2020-03-18 DIAGNOSIS — Z5112 Encounter for antineoplastic immunotherapy: Secondary | ICD-10-CM | POA: Diagnosis not present

## 2020-03-18 LAB — CMP (CANCER CENTER ONLY)
ALT: 43 U/L (ref 0–44)
AST: 21 U/L (ref 15–41)
Albumin: 3.6 g/dL (ref 3.5–5.0)
Alkaline Phosphatase: 111 U/L (ref 38–126)
Anion gap: 12 (ref 5–15)
BUN: 12 mg/dL (ref 8–23)
CO2: 27 mmol/L (ref 22–32)
Calcium: 10.5 mg/dL — ABNORMAL HIGH (ref 8.9–10.3)
Chloride: 95 mmol/L — ABNORMAL LOW (ref 98–111)
Creatinine: 0.82 mg/dL (ref 0.61–1.24)
GFR, Est AFR Am: >60 mL/min (ref >60)
GFR, Estimated: >60 mL/min (ref >60)
Glucose, Bld: 254 mg/dL — ABNORMAL HIGH (ref 70–99)
Potassium: 4.2 mmol/L (ref 3.5–5.1)
Sodium: 134 mmol/L — ABNORMAL LOW (ref 135–145)
Total Bilirubin: <0.2 mg/dL — ABNORMAL LOW (ref 0.3–1.2)
Total Protein: 7 g/dL (ref 6.5–8.1)

## 2020-03-18 LAB — CBC WITH DIFFERENTIAL (CANCER CENTER ONLY)
Abs Immature Granulocytes: 0.03 10*3/uL (ref 0.00–0.07)
Basophils Absolute: 0 10*3/uL (ref 0.0–0.1)
Basophils Relative: 0 %
Eosinophils Absolute: 0 10*3/uL (ref 0.0–0.5)
Eosinophils Relative: 1 %
HCT: 33.1 % — ABNORMAL LOW (ref 39.0–52.0)
Hemoglobin: 11.3 g/dL — ABNORMAL LOW (ref 13.0–17.0)
Immature Granulocytes: 1 %
Lymphocytes Relative: 40 %
Lymphs Abs: 1.5 10*3/uL (ref 0.7–4.0)
MCH: 31.8 pg (ref 26.0–34.0)
MCHC: 34.1 g/dL (ref 30.0–36.0)
MCV: 93.2 fL (ref 80.0–100.0)
Monocytes Absolute: 0.6 10*3/uL (ref 0.1–1.0)
Monocytes Relative: 15 %
Neutro Abs: 1.6 10*3/uL — ABNORMAL LOW (ref 1.7–7.7)
Neutrophils Relative %: 43 %
Platelet Count: 101 10*3/uL — ABNORMAL LOW (ref 150–400)
RBC: 3.55 MIL/uL — ABNORMAL LOW (ref 4.22–5.81)
RDW: 16.4 % — ABNORMAL HIGH (ref 11.5–15.5)
WBC Count: 3.6 10*3/uL — ABNORMAL LOW (ref 4.0–10.5)
nRBC: 0 % (ref 0.0–0.2)

## 2020-03-18 LAB — TSH: TSH: 0.667 u[IU]/mL (ref 0.320–4.118)

## 2020-03-18 MED ORDER — HEPARIN SOD (PORK) LOCK FLUSH 100 UNIT/ML IV SOLN
500.0000 [IU] | Freq: Once | INTRAVENOUS | Status: AC
  Administered 2020-03-18: 500 [IU]
  Filled 2020-03-18: qty 5

## 2020-03-18 MED ORDER — SODIUM CHLORIDE 0.9% FLUSH
10.0000 mL | Freq: Once | INTRAVENOUS | Status: AC
  Administered 2020-03-18: 10 mL
  Filled 2020-03-18: qty 10

## 2020-03-23 ENCOUNTER — Telehealth: Payer: Self-pay | Admitting: Medical Oncology

## 2020-03-23 NOTE — Telephone Encounter (Signed)
I told pt his  next scans will be approximately July.

## 2020-03-24 NOTE — Progress Notes (Signed)
Pharmacist Chemotherapy Monitoring - Follow Up Assessment    I verify that I have reviewed each item in the below checklist:  . Regimen for the patient is scheduled for the appropriate day and plan matches scheduled date. Marland Kitchen Appropriate non-routine labs are ordered dependent on drug ordered. . If applicable, additional medications reviewed and ordered per protocol based on lifetime cumulative doses and/or treatment regimen.   Plan for follow-up and/or issues identified: No . I-vent associated with next due treatment: No . MD and/or nursing notified: No    Kennith Center, Pharm.D., CPP 03/24/2020@3 :59 PM

## 2020-03-25 ENCOUNTER — Inpatient Hospital Stay: Payer: No Typology Code available for payment source

## 2020-03-25 ENCOUNTER — Other Ambulatory Visit: Payer: Self-pay

## 2020-03-25 ENCOUNTER — Ambulatory Visit: Payer: Non-veteran care | Admitting: Internal Medicine

## 2020-03-25 ENCOUNTER — Ambulatory Visit: Payer: Non-veteran care

## 2020-03-25 DIAGNOSIS — Z5112 Encounter for antineoplastic immunotherapy: Secondary | ICD-10-CM | POA: Diagnosis not present

## 2020-03-25 DIAGNOSIS — Z95828 Presence of other vascular implants and grafts: Secondary | ICD-10-CM

## 2020-03-25 DIAGNOSIS — C3491 Malignant neoplasm of unspecified part of right bronchus or lung: Secondary | ICD-10-CM

## 2020-03-25 LAB — CBC WITH DIFFERENTIAL (CANCER CENTER ONLY)
Abs Immature Granulocytes: 0.03 10*3/uL (ref 0.00–0.07)
Basophils Absolute: 0 10*3/uL (ref 0.0–0.1)
Basophils Relative: 0 %
Eosinophils Absolute: 0.1 10*3/uL (ref 0.0–0.5)
Eosinophils Relative: 2 %
HCT: 33.9 % — ABNORMAL LOW (ref 39.0–52.0)
Hemoglobin: 11.6 g/dL — ABNORMAL LOW (ref 13.0–17.0)
Immature Granulocytes: 1 %
Lymphocytes Relative: 35 %
Lymphs Abs: 1.5 10*3/uL (ref 0.7–4.0)
MCH: 32.3 pg (ref 26.0–34.0)
MCHC: 34.2 g/dL (ref 30.0–36.0)
MCV: 94.4 fL (ref 80.0–100.0)
Monocytes Absolute: 0.6 10*3/uL (ref 0.1–1.0)
Monocytes Relative: 15 %
Neutro Abs: 1.9 10*3/uL (ref 1.7–7.7)
Neutrophils Relative %: 47 %
Platelet Count: 201 10*3/uL (ref 150–400)
RBC: 3.59 MIL/uL — ABNORMAL LOW (ref 4.22–5.81)
RDW: 17.5 % — ABNORMAL HIGH (ref 11.5–15.5)
WBC Count: 4.2 10*3/uL (ref 4.0–10.5)
nRBC: 0 % (ref 0.0–0.2)

## 2020-03-25 LAB — CMP (CANCER CENTER ONLY)
ALT: 37 U/L (ref 0–44)
AST: 25 U/L (ref 15–41)
Albumin: 3.7 g/dL (ref 3.5–5.0)
Alkaline Phosphatase: 104 U/L (ref 38–126)
Anion gap: 11 (ref 5–15)
BUN: 9 mg/dL (ref 8–23)
CO2: 26 mmol/L (ref 22–32)
Calcium: 9.9 mg/dL (ref 8.9–10.3)
Chloride: 96 mmol/L — ABNORMAL LOW (ref 98–111)
Creatinine: 1.04 mg/dL (ref 0.61–1.24)
GFR, Est AFR Am: >60 mL/min (ref >60)
GFR, Estimated: >60 mL/min (ref >60)
Glucose, Bld: 270 mg/dL — ABNORMAL HIGH (ref 70–99)
Potassium: 4 mmol/L (ref 3.5–5.1)
Sodium: 133 mmol/L — ABNORMAL LOW (ref 135–145)
Total Bilirubin: <0.2 mg/dL — ABNORMAL LOW (ref 0.3–1.2)
Total Protein: 7.3 g/dL (ref 6.5–8.1)

## 2020-03-25 MED ORDER — SODIUM CHLORIDE 0.9% FLUSH
10.0000 mL | Freq: Once | INTRAVENOUS | Status: AC
  Administered 2020-03-25: 10 mL
  Filled 2020-03-25: qty 10

## 2020-03-27 ENCOUNTER — Other Ambulatory Visit: Payer: Self-pay | Admitting: Lab

## 2020-03-28 NOTE — Progress Notes (Signed)
[facility] OFFICE PROGRESS NOTE  [name], [address]  [place] [address]  DIAGNOSIS: Stage IV (T2b, N3, M1a) non-small cell lung cancer, adenocarcinoma presented with large right upper lobe lung mass in addition to mediastinal and right supraclavicular lymphadenopathy as well as bilateral pulmonary nodules diagnosed in February 2021.  MOLECULAR STUDY by [employer]:  KRASG12C,4.3%,Binimetinib  [id number],0.9%,Niraparib, Olaparib, Rucaparib,Talazoparib, Tazemetostat  [id number],7.9%,KWIO  PRIOR THERAPY: None   CURRENT THERAPY: Systemic chemotherapy with carboplatin for AUC of 5, Alimta 500 mg/M2 and Keytruda 200 mg IV every 3 weeks. First dose December 31, 2019. Status post 4 cycles. Starting from cycle #5, he will be on maintenance Alimta and Keytruda.   INTERVAL HISTORY: Ura Hausen. 64 y.o. male returns to the clinic for a follow up visit. The patient is feeling well today without any concerning complaints except for fatigue and mild gum soreness. He denies any ulcerations. The patient continues to tolerate treatment otherwise well. Denies any fever, chills, night sweats, or weight loss. He states he has a good appetite. He states he is unable to exercise due to what sounds like peripheral vascular disease in his legs causing claudication. He used to see a Hydrographic surveyor when he lived in [place], [place] but has not see anyone since moving here 4 years ago. He is planning on talking to his PCP at the [facility] about a referral to one. Denies any chest pain or hemoptysis but reports his baseline shortness of breath with exertion and baseline dry cough. Denies any nausea or vomiting. He has periodic constipation for which he uses metamucil. he did have one loose stool last night which he believes is secondary to something he ate. Denies any headache or visual changes. Denies any rashes but notices areas of skin darkening. The patient is  here today for evaluation prior to starting cycle # 5   MEDICAL HISTORY: Past Medical History:  Diagnosis Date  . Arthritis   . Depression   . DM (diabetes mellitus) ([place])    type II  . Dyslipidemia   . Dyspnea    unable to walk much - he thinks it is from   . GERD (gastroesophageal reflux disease)   . Hepatitis    Hepatitis C- treated  . History of kidney stones   . HTN (hypertension)   . Neuropathy   . nscl ca dx'd 10/2019  . OSA on CPAP   . PTSD (post-traumatic stress disorder)     ALLERGIES:  is allergic to metformin and related.  MEDICATIONS:  Current Outpatient Medications  Medication Sig Dispense Refill  . amLODipine (NORVASC) 10 MG tablet Take 10 mg by mouth daily.    Marland Kitchen aspirin EC 81 MG tablet Take 81 mg by mouth daily.    Marland Kitchen atorvastatin (LIPITOR) 10 MG tablet Take 10 mg by mouth daily.    . bisacodyl (DULCOLAX) 5 MG EC tablet Take 5 mg by mouth daily as needed for moderate constipation.    . calcium carbonate (TUMS - DOSED IN MG ELEMENTAL CALCIUM) 500 MG chewable tablet Chew 2 tablets by mouth daily as needed for indigestion or heartburn.    . folic acid (FOLVITE) 1 MG tablet Take 1 tablet (1 mg total) by mouth daily. Pt needs to pick up today and start ASAP . He will pay out of pocket. He is waiting on the [facility] to send his folic acid which will be next week. 7 tablet 0  . glipiZIDE (GLUCOTROL) 5 MG tablet  Take 5 mg by mouth daily before breakfast.     . lidocaine-prilocaine (EMLA) cream Apply to Port-A-Cath site 30-60-minute before treatment. 30 g 0  . magic mouthwash SOLN Take 5 mLs by mouth 4 (four) times daily as needed for mouth pain. Swish and swallow or spit 240 mL 2  . Menthol ([employer] LOZENGES MT) Use as directed 1 lozenge in the mouth or throat daily as needed (throat).     . naproxen (NAPROSYN) 500 MG tablet Take 500 mg by mouth 2 (two) times daily with a meal.    . pregabalin (LYRICA) 25 MG capsule Take 1 capsule (25 mg total) by mouth daily. 30 capsule 1  .  psyllium (METAMUCIL) 58.6 % powder Take 1 packet by mouth daily.    . sildenafil (VIAGRA) 100 MG tablet Take 100 mg by mouth daily as needed for erectile dysfunction.    . triamterene-hydrochlorothiazide (MAXZIDE-25) 37.5-25 MG tablet Take 1 tablet by mouth daily as needed (swelling).     Marland Kitchen albuterol (VENTOLIN HFA) 108 (90 Base) MCG/ACT inhaler Inhale 2 puffs into the lungs every 4 (four) hours as needed for wheezing or shortness of breath. (Patient not taking: Reported on 03/09/2020) 18 g 3  . umeclidinium-vilanterol (ANORO ELLIPTA) 62.5-25 MCG/INH AEPB Inhale 1 puff into the lungs daily. (Patient not taking: Reported on 03/09/2020) 60 each 11   No current facility-administered medications for this visit.    SURGICAL HISTORY:  Past Surgical History:  Procedure Laterality Date  . BIOPSY OF MEDIASTINAL MASS  12/10/2019   Procedure: BIOPSY OF MEDIASTINAL MASS;  Surgeon: Garner Nash, DO;  Location: [facility];  Service: Pulmonary;;  right upper lobe  . BRONCHIAL NEEDLE ASPIRATION BIOPSY  12/10/2019   Procedure: BRONCHIAL NEEDLE ASPIRATION BIOPSIES;  Surgeon: Garner Nash, DO;  Location: [facility];  Service: Pulmonary;;  . COLONOSCOPY    . IR IMAGING GUIDED PORT INSERTION  12/31/2019  . kidney stone    . URETERAL STENT PLACEMENT    . Ureteral Stent Removed    . VIDEO BRONCHOSCOPY WITH ENDOBRONCHIAL ULTRASOUND N/A 12/10/2019   Procedure: VIDEO BRONCHOSCOPY WITH ENDOBRONCHIAL ULTRASOUND;  Surgeon: Garner Nash, DO;  Location: [facility];  Service: Pulmonary;  Laterality: N/A;    REVIEW OF SYSTEMS:   Review of Systems  Constitutional: Positive for fatigue. Negative for appetite change, chills, fever and unexpected weight change.  HENT: Positive for gum soreness. Negative for ulcerations, nosebleeds, sore throat and trouble swallowing.   Eyes: Negative for eye problems and icterus.  Respiratory: Positive for dry cough and baseline dyspnea on exertion. Negative for hemoptysis and  wheezing.   Cardiovascular: Negative for chest pain and leg swelling.  Gastrointestinal: Positive for periodic constipation. Negative for abdominal pain, diarrhea, nausea and vomiting.  Genitourinary: Negative for bladder incontinence, difficulty urinating, dysuria, frequency and hematuria.   Musculoskeletal: Negative for back pain, gait problem, neck pain and neck stiffness.  Skin: Negative for itching and rash.  Neurological: Negative for dizziness, extremity weakness, gait problem, headaches, light-headedness and seizures.  Hematological: Negative for adenopathy. Does not bruise/bleed easily.  Psychiatric/Behavioral: Negative for confusion, depression and sleep disturbance. The patient is not nervous/anxious.     PHYSICAL EXAMINATION:  Blood pressure 133/69, pulse 91, temperature 98.2 F (36.8 C), temperature source Temporal, resp. rate 18, height 5\' 9"  (1.753 m), weight 237 lb 3.2 oz (107.6 kg), SpO2 100 %.  ECOG PERFORMANCE STATUS: 1 - Symptomatic but completely ambulatory  Physical Exam  Constitutional: Oriented to person, place, and time  and well-developed, well-nourished, and in no distress.  HENT:  Head: Normocephalic and atraumatic.  Mouth/Throat: Oropharynx is clear and moist. No oropharyngeal exudate.  Eyes: Conjunctivae are normal. Right eye exhibits no discharge. Left eye exhibits no discharge. No scleral icterus.  Neck: Normal range of motion. Neck supple.  Cardiovascular: Normal rate, regular rhythm, normal heart sounds and intact distal pulses.   Pulmonary/Chest: Effort normal and breath sounds normal. No respiratory distress. No wheezes. No rales.  Abdominal: Soft. Bowel sounds are normal. Exhibits no distension and no mass. There is no tenderness.  Musculoskeletal: Normal range of motion. Exhibits no edema.  Lymphadenopathy:    No cervical adenopathy.  Neurological: Alert and oriented to person, place, and time. Exhibits normal muscle tone. Gait normal. Coordination  normal.  Skin: Skin darkening on his hands and face. Skin is warm and dry. No rash noted. Not diaphoretic. No erythema. No pallor.  Psychiatric: Mood, memory and judgment normal.  Vitals reviewed.  LABORATORY DATA: Lab Results  Component Value Date   WBC 5.4 03/30/2020   HGB 11.8 (L) 03/30/2020   HCT 34.7 (L) 03/30/2020   MCV 93.5 03/30/2020   PLT 326 03/30/2020      Chemistry      Component Value Date/Time   NA 134 (L) 03/30/2020 1100   K 3.8 03/30/2020 1100   CL 97 (L) 03/30/2020 1100   CO2 27 03/30/2020 1100   BUN 14 03/30/2020 1100   CREATININE 1.05 03/30/2020 1100      Component Value Date/Time   CALCIUM 10.2 03/30/2020 1100   ALKPHOS 98 03/30/2020 1100   AST 20 03/30/2020 1100   ALT 30 03/30/2020 1100   BILITOT 0.3 03/30/2020 1100       RADIOGRAPHIC STUDIES:  CT Chest W Contrast  Result Date: 03/02/2020 CLINICAL DATA:  Non-small-cell lung cancer, staging. Diagnosed 12/20 with chemotherapy ongoing. Immunotherapy on going. Short of breath on exertion. Constipation. EXAM: CT CHEST, ABDOMEN, AND PELVIS WITH CONTRAST TECHNIQUE: Multidetector CT imaging of the chest, abdomen and pelvis was performed following the standard protocol during bolus administration of intravenous contrast. CONTRAST:  146mL OMNIPAQUE IOHEXOL 300 MG/ML  SOLN COMPARISON:  PET 12/09/2019. FINDINGS: CT CHEST FINDINGS Cardiovascular: Right Port-A-Cath tip at mid right atrium. Aortic and branch vessel atherosclerosis. Normal heart size, without pericardial effusion. Multivessel coronary artery atherosclerosis. No central pulmonary embolism, on this non-dedicated study. Mediastinum/Nodes: Resolved borderline right supraclavicular adenopathy. Right paratracheal node measures 1.3 cm on 18/2 versus 2.0 cm on the prior exam. 1.5 cm subcarinal node on 26/2 measured 2.3 cm on the prior exam (when remeasured). No new thoracic adenopathy. Lungs/Pleura: No pleural fluid.  Mild centrilobular emphysema. Posterior right  upper lobe lung mass measures 4.0 x 3.6 cm on 49/7. Somewhat difficult to compare to the prior PET, secondary to surrounding postobstructive pneumonitis on that exam. Felt to previously measure on the order of 5.7 x 4.8 cm (when remeasured). Resolution of previously described pulmonary nodules. Musculoskeletal: No acute osseous abnormality. CT ABDOMEN PELVIS FINDINGS Hepatobiliary: Possible mild hepatic steatosis. No focal liver lesion. Normal gallbladder, without biliary ductal dilatation. Pancreas: Normal, without mass or ductal dilatation. Spleen: Normal in size, without focal abnormality. Adrenals/Urinary Tract: Mild left adrenal nodularity is unchanged. A right adrenal nodule of 2.3 cm is similar in size on the prior PET, and low-density, most consistent with an adenoma. Upper pole right renal 1.6 cm cyst. Normal left kidney, without hydronephrosis. Normal urinary bladder. Stomach/Bowel: Normal stomach, without wall thickening. Normal colon, appendix, and terminal ileum.  Normal small bowel. Vascular/Lymphatic: Advanced aortic and branch vessel atherosclerosis. No abdominopelvic adenopathy. Reproductive: Normal prostate. Other: Small fat containing right inguinal hernia. Musculoskeletal: No acute osseous abnormality. IMPRESSION: 1. Response to therapy of right upper lobe lung mass and thoracic adenopathy since 12/09/2019. 2. Resolution of pulmonary nodules, consistent with response to therapy of pulmonary metastasis. 3. No new or progressive disease. 4. Right adrenal adenoma. 5. Aortic atherosclerosis (ICD10-I70.0), coronary artery atherosclerosis and emphysema (ICD10-J43.9). Electronically Signed   By: Abigail Miyamoto M.D.   On: 03/02/2020 16:02   CT Abdomen Pelvis W Contrast  Result Date: 03/02/2020 CLINICAL DATA:  Non-small-cell lung cancer, staging. Diagnosed 12/20 with chemotherapy ongoing. Immunotherapy on going. Short of breath on exertion. Constipation. EXAM: CT CHEST, ABDOMEN, AND PELVIS WITH CONTRAST  TECHNIQUE: Multidetector CT imaging of the chest, abdomen and pelvis was performed following the standard protocol during bolus administration of intravenous contrast. CONTRAST:  110mL OMNIPAQUE IOHEXOL 300 MG/ML  SOLN COMPARISON:  PET 12/09/2019. FINDINGS: CT CHEST FINDINGS Cardiovascular: Right Port-A-Cath tip at mid right atrium. Aortic and branch vessel atherosclerosis. Normal heart size, without pericardial effusion. Multivessel coronary artery atherosclerosis. No central pulmonary embolism, on this non-dedicated study. Mediastinum/Nodes: Resolved borderline right supraclavicular adenopathy. Right paratracheal node measures 1.3 cm on 18/2 versus 2.0 cm on the prior exam. 1.5 cm subcarinal node on 26/2 measured 2.3 cm on the prior exam (when remeasured). No new thoracic adenopathy. Lungs/Pleura: No pleural fluid.  Mild centrilobular emphysema. Posterior right upper lobe lung mass measures 4.0 x 3.6 cm on 49/7. Somewhat difficult to compare to the prior PET, secondary to surrounding postobstructive pneumonitis on that exam. Felt to previously measure on the order of 5.7 x 4.8 cm (when remeasured). Resolution of previously described pulmonary nodules. Musculoskeletal: No acute osseous abnormality. CT ABDOMEN PELVIS FINDINGS Hepatobiliary: Possible mild hepatic steatosis. No focal liver lesion. Normal gallbladder, without biliary ductal dilatation. Pancreas: Normal, without mass or ductal dilatation. Spleen: Normal in size, without focal abnormality. Adrenals/Urinary Tract: Mild left adrenal nodularity is unchanged. A right adrenal nodule of 2.3 cm is similar in size on the prior PET, and low-density, most consistent with an adenoma. Upper pole right renal 1.6 cm cyst. Normal left kidney, without hydronephrosis. Normal urinary bladder. Stomach/Bowel: Normal stomach, without wall thickening. Normal colon, appendix, and terminal ileum. Normal small bowel. Vascular/Lymphatic: Advanced aortic and branch vessel  atherosclerosis. No abdominopelvic adenopathy. Reproductive: Normal prostate. Other: Small fat containing right inguinal hernia. Musculoskeletal: No acute osseous abnormality. IMPRESSION: 1. Response to therapy of right upper lobe lung mass and thoracic adenopathy since 12/09/2019. 2. Resolution of pulmonary nodules, consistent with response to therapy of pulmonary metastasis. 3. No new or progressive disease. 4. Right adrenal adenoma. 5. Aortic atherosclerosis (ICD10-I70.0), coronary artery atherosclerosis and emphysema (ICD10-J43.9). Electronically Signed   By: Abigail Miyamoto M.D.   On: 03/02/2020 16:02     ASSESSMENT/PLAN:  This is a very pleasant 64 year old African-American male recently diagnosed with stage IV non-small cell lung cancer, adenocarcinoma.  He presented with a large right upper lobe lung mass with right hilar, subcarinal, paratracheal lymphadenopathy, and right supraclavicular lymphadenopathy.  He also presented with bilateral pulmonary nodules.  He was diagnosed in February 2021.  He does not have any actionable mutations.  The patient is currently undergoing palliative systemic chemotherapy with carboplatin for an AUC of 5, Alimta 500 mg/m, Keytruda 200 mg IV every 3 weeks.  He is status post 4 cycles and he tolerated it well without any adverse side effects. Starting from cycle #  5   Labs were reviewed. Recommend that he proceed with cycle #5 today as scheduled.   We will see him back for a follow up visit in 3 weeks for evaluation before starting cycle #6.   The patient was encouraged to use salt water rinses and or biotene for his occasional gum soreness.   The patient was advised to call immediately if he has any concerning symptoms in the interval. The patient voices understanding of current disease status and treatment options and is in agreement with the current care plan. All questions were answered. The patient knows to call the clinic with any problems, questions or  concerns. We can certainly see the patient much sooner if necessary       No orders of the defined types were placed in this encounter.    Lacoya Wilbanks L Tommey Barret, PA-C 03/30/20

## 2020-03-30 ENCOUNTER — Inpatient Hospital Stay: Payer: No Typology Code available for payment source

## 2020-03-30 ENCOUNTER — Other Ambulatory Visit: Payer: Self-pay

## 2020-03-30 ENCOUNTER — Inpatient Hospital Stay (HOSPITAL_BASED_OUTPATIENT_CLINIC_OR_DEPARTMENT_OTHER): Payer: No Typology Code available for payment source | Admitting: Physician Assistant

## 2020-03-30 VITALS — BP 133/69 | HR 91 | Temp 98.2°F | Resp 18 | Ht 69.0 in | Wt 237.2 lb

## 2020-03-30 DIAGNOSIS — C3491 Malignant neoplasm of unspecified part of right bronchus or lung: Secondary | ICD-10-CM

## 2020-03-30 DIAGNOSIS — Z5111 Encounter for antineoplastic chemotherapy: Secondary | ICD-10-CM | POA: Diagnosis not present

## 2020-03-30 DIAGNOSIS — Z5112 Encounter for antineoplastic immunotherapy: Secondary | ICD-10-CM | POA: Diagnosis not present

## 2020-03-30 LAB — CBC WITH DIFFERENTIAL (CANCER CENTER ONLY)
Abs Immature Granulocytes: 0.05 10*3/uL (ref 0.00–0.07)
Basophils Absolute: 0 10*3/uL (ref 0.0–0.1)
Basophils Relative: 0 %
Eosinophils Absolute: 0.2 10*3/uL (ref 0.0–0.5)
Eosinophils Relative: 3 %
HCT: 34.7 % — ABNORMAL LOW (ref 39.0–52.0)
Hemoglobin: 11.8 g/dL — ABNORMAL LOW (ref 13.0–17.0)
Immature Granulocytes: 1 %
Lymphocytes Relative: 41 %
Lymphs Abs: 2.2 10*3/uL (ref 0.7–4.0)
MCH: 31.8 pg (ref 26.0–34.0)
MCHC: 34 g/dL (ref 30.0–36.0)
MCV: 93.5 fL (ref 80.0–100.0)
Monocytes Absolute: 0.8 10*3/uL (ref 0.1–1.0)
Monocytes Relative: 14 %
Neutro Abs: 2.2 10*3/uL (ref 1.7–7.7)
Neutrophils Relative %: 41 %
Platelet Count: 326 10*3/uL (ref 150–400)
RBC: 3.71 MIL/uL — ABNORMAL LOW (ref 4.22–5.81)
RDW: 17.9 % — ABNORMAL HIGH (ref 11.5–15.5)
WBC Count: 5.4 10*3/uL (ref 4.0–10.5)
nRBC: 0.4 % — ABNORMAL HIGH (ref 0.0–0.2)

## 2020-03-30 LAB — CMP (CANCER CENTER ONLY)
ALT: 30 U/L (ref 0–44)
AST: 20 U/L (ref 15–41)
Albumin: 3.6 g/dL (ref 3.5–5.0)
Alkaline Phosphatase: 98 U/L (ref 38–126)
Anion gap: 10 (ref 5–15)
BUN: 14 mg/dL (ref 8–23)
CO2: 27 mmol/L (ref 22–32)
Calcium: 10.2 mg/dL (ref 8.9–10.3)
Chloride: 97 mmol/L — ABNORMAL LOW (ref 98–111)
Creatinine: 1.05 mg/dL (ref 0.61–1.24)
GFR, Est AFR Am: >60 mL/min (ref >60)
GFR, Estimated: >60 mL/min (ref >60)
Glucose, Bld: 173 mg/dL — ABNORMAL HIGH (ref 70–99)
Potassium: 3.8 mmol/L (ref 3.5–5.1)
Sodium: 134 mmol/L — ABNORMAL LOW (ref 135–145)
Total Bilirubin: 0.3 mg/dL (ref 0.3–1.2)
Total Protein: 7.1 g/dL (ref 6.5–8.1)

## 2020-03-30 MED ORDER — SODIUM CHLORIDE 0.9 % IV SOLN
Freq: Once | INTRAVENOUS | Status: AC
  Filled 2020-03-30: qty 250

## 2020-03-30 MED ORDER — SODIUM CHLORIDE 0.9 % IV SOLN
Freq: Once | INTRAVENOUS | Status: DC
  Filled 2020-03-30: qty 250

## 2020-03-30 MED ORDER — PROCHLORPERAZINE MALEATE 10 MG PO TABS
ORAL_TABLET | ORAL | Status: AC
  Filled 2020-03-30: qty 1

## 2020-03-30 MED ORDER — HEPARIN SOD (PORK) LOCK FLUSH 100 UNIT/ML IV SOLN
500.0000 [IU] | Freq: Once | INTRAVENOUS | Status: AC | PRN
  Administered 2020-03-30: 500 [IU]
  Filled 2020-03-30: qty 5

## 2020-03-30 MED ORDER — SODIUM CHLORIDE 0.9 % IV SOLN
200.0000 mg | Freq: Once | INTRAVENOUS | Status: AC
  Administered 2020-03-30: 200 mg via INTRAVENOUS
  Filled 2020-03-30: qty 8

## 2020-03-30 MED ORDER — SODIUM CHLORIDE 0.9% FLUSH
10.0000 mL | INTRAVENOUS | Status: DC | PRN
  Administered 2020-03-30: 10 mL
  Filled 2020-03-30: qty 10

## 2020-03-30 MED ORDER — SODIUM CHLORIDE 0.9 % IV SOLN
500.0000 mg/m2 | Freq: Once | INTRAVENOUS | Status: AC
  Administered 2020-03-30: 1200 mg via INTRAVENOUS
  Filled 2020-03-30: qty 8

## 2020-03-30 MED ORDER — PROCHLORPERAZINE MALEATE 10 MG PO TABS
10.0000 mg | ORAL_TABLET | Freq: Once | ORAL | Status: AC
  Administered 2020-03-30: 10 mg via ORAL

## 2020-03-30 NOTE — Patient Instructions (Signed)
[facility] Discharge Instructions for Patients Receiving Chemotherapy  Today you received the following chemotherapy agents Keytruda, Alimta  To help prevent nausea and vomiting after your treatment, we encourage you to take your nausea medication as directed.    If you develop nausea and vomiting that is not controlled by your nausea medication, call the clinic.   BELOW ARE SYMPTOMS THAT SHOULD BE REPORTED IMMEDIATELY:  *FEVER GREATER THAN 100.5 F  *CHILLS WITH OR WITHOUT FEVER  NAUSEA AND VOMITING THAT IS NOT CONTROLLED WITH YOUR NAUSEA MEDICATION  *UNUSUAL SHORTNESS OF BREATH  *UNUSUAL BRUISING OR BLEEDING  TENDERNESS IN MOUTH AND THROAT WITH OR WITHOUT PRESENCE OF ULCERS  *URINARY PROBLEMS  *BOWEL PROBLEMS  UNUSUAL RASH Items with * indicate a potential emergency and should be followed up as soon as possible.  Feel free to call the clinic should you have any questions or concerns. The clinic phone number is [phone].  Please show the [facility] at check-in to the Emergency Department and triage nurse.

## 2020-04-20 ENCOUNTER — Inpatient Hospital Stay: Payer: No Typology Code available for payment source

## 2020-04-20 ENCOUNTER — Other Ambulatory Visit: Payer: Self-pay

## 2020-04-20 ENCOUNTER — Encounter: Payer: Self-pay | Admitting: Internal Medicine

## 2020-04-20 ENCOUNTER — Inpatient Hospital Stay: Payer: No Typology Code available for payment source | Attending: Internal Medicine | Admitting: Internal Medicine

## 2020-04-20 VITALS — BP 124/73 | HR 92 | Temp 97.9°F | Resp 18 | Ht 69.0 in | Wt 240.3 lb

## 2020-04-20 DIAGNOSIS — I10 Essential (primary) hypertension: Secondary | ICD-10-CM | POA: Diagnosis not present

## 2020-04-20 DIAGNOSIS — Z5111 Encounter for antineoplastic chemotherapy: Secondary | ICD-10-CM | POA: Insufficient documentation

## 2020-04-20 DIAGNOSIS — C3411 Malignant neoplasm of upper lobe, right bronchus or lung: Secondary | ICD-10-CM | POA: Insufficient documentation

## 2020-04-20 DIAGNOSIS — Z95828 Presence of other vascular implants and grafts: Secondary | ICD-10-CM

## 2020-04-20 DIAGNOSIS — C3491 Malignant neoplasm of unspecified part of right bronchus or lung: Secondary | ICD-10-CM

## 2020-04-20 DIAGNOSIS — C3490 Malignant neoplasm of unspecified part of unspecified bronchus or lung: Secondary | ICD-10-CM

## 2020-04-20 DIAGNOSIS — Z79899 Other long term (current) drug therapy: Secondary | ICD-10-CM | POA: Insufficient documentation

## 2020-04-20 DIAGNOSIS — Z5112 Encounter for antineoplastic immunotherapy: Secondary | ICD-10-CM | POA: Diagnosis present

## 2020-04-20 LAB — CBC WITH DIFFERENTIAL (CANCER CENTER ONLY)
Abs Immature Granulocytes: 0.05 10*3/uL (ref 0.00–0.07)
Basophils Absolute: 0 10*3/uL (ref 0.0–0.1)
Basophils Relative: 0 %
Eosinophils Absolute: 0.2 10*3/uL (ref 0.0–0.5)
Eosinophils Relative: 3 %
HCT: 35.6 % — ABNORMAL LOW (ref 39.0–52.0)
Hemoglobin: 12.1 g/dL — ABNORMAL LOW (ref 13.0–17.0)
Immature Granulocytes: 1 %
Lymphocytes Relative: 29 %
Lymphs Abs: 1.8 10*3/uL (ref 0.7–4.0)
MCH: 32.6 pg (ref 26.0–34.0)
MCHC: 34 g/dL (ref 30.0–36.0)
MCV: 96 fL (ref 80.0–100.0)
Monocytes Absolute: 0.9 10*3/uL (ref 0.1–1.0)
Monocytes Relative: 14 %
Neutro Abs: 3.4 10*3/uL (ref 1.7–7.7)
Neutrophils Relative %: 53 %
Platelet Count: 307 10*3/uL (ref 150–400)
RBC: 3.71 MIL/uL — ABNORMAL LOW (ref 4.22–5.81)
RDW: 17.2 % — ABNORMAL HIGH (ref 11.5–15.5)
WBC Count: 6.4 10*3/uL (ref 4.0–10.5)
nRBC: 0 % (ref 0.0–0.2)

## 2020-04-20 LAB — CMP (CANCER CENTER ONLY)
ALT: 43 U/L (ref 0–44)
AST: 26 U/L (ref 15–41)
Albumin: 3.6 g/dL (ref 3.5–5.0)
Alkaline Phosphatase: 91 U/L (ref 38–126)
Anion gap: 12 (ref 5–15)
BUN: 12 mg/dL (ref 8–23)
CO2: 26 mmol/L (ref 22–32)
Calcium: 10.1 mg/dL (ref 8.9–10.3)
Chloride: 100 mmol/L (ref 98–111)
Creatinine: 1.07 mg/dL (ref 0.61–1.24)
GFR, Est AFR Am: >60 mL/min (ref >60)
GFR, Estimated: >60 mL/min (ref >60)
Glucose, Bld: 202 mg/dL — ABNORMAL HIGH (ref 70–99)
Potassium: 4.1 mmol/L (ref 3.5–5.1)
Sodium: 138 mmol/L (ref 135–145)
Total Bilirubin: 0.3 mg/dL (ref 0.3–1.2)
Total Protein: 7 g/dL (ref 6.5–8.1)

## 2020-04-20 LAB — TSH: TSH: 1.066 u[IU]/mL (ref 0.320–4.118)

## 2020-04-20 MED ORDER — SODIUM CHLORIDE 0.9% FLUSH
10.0000 mL | Freq: Once | INTRAVENOUS | Status: AC
  Administered 2020-04-20: 10 mL
  Filled 2020-04-20: qty 10

## 2020-04-20 MED ORDER — PROCHLORPERAZINE MALEATE 10 MG PO TABS
10.0000 mg | ORAL_TABLET | Freq: Once | ORAL | Status: AC
  Administered 2020-04-20: 10 mg via ORAL

## 2020-04-20 MED ORDER — CYANOCOBALAMIN 1000 MCG/ML IJ SOLN
INTRAMUSCULAR | Status: AC
  Filled 2020-04-20: qty 1

## 2020-04-20 MED ORDER — CYANOCOBALAMIN 1000 MCG/ML IJ SOLN
1000.0000 ug | Freq: Once | INTRAMUSCULAR | Status: AC
  Administered 2020-04-20: 1000 ug via INTRAMUSCULAR

## 2020-04-20 MED ORDER — SODIUM CHLORIDE 0.9 % IV SOLN
200.0000 mg | Freq: Once | INTRAVENOUS | Status: AC
  Administered 2020-04-20: 200 mg via INTRAVENOUS
  Filled 2020-04-20: qty 8

## 2020-04-20 MED ORDER — SODIUM CHLORIDE 0.9 % IV SOLN
500.0000 mg/m2 | Freq: Once | INTRAVENOUS | Status: AC
  Administered 2020-04-20: 1200 mg via INTRAVENOUS
  Filled 2020-04-20: qty 40

## 2020-04-20 MED ORDER — SODIUM CHLORIDE 0.9 % IV SOLN
Freq: Once | INTRAVENOUS | Status: AC
  Filled 2020-04-20: qty 250

## 2020-04-20 MED ORDER — PROCHLORPERAZINE MALEATE 10 MG PO TABS
ORAL_TABLET | ORAL | Status: AC
  Filled 2020-04-20: qty 1

## 2020-04-20 MED ORDER — SODIUM CHLORIDE 0.9% FLUSH
10.0000 mL | INTRAVENOUS | Status: DC | PRN
  Administered 2020-04-20: 10 mL
  Filled 2020-04-20: qty 10

## 2020-04-20 MED ORDER — HEPARIN SOD (PORK) LOCK FLUSH 100 UNIT/ML IV SOLN
500.0000 [IU] | Freq: Once | INTRAVENOUS | Status: AC | PRN
  Administered 2020-04-20: 500 [IU]
  Filled 2020-04-20: qty 5

## 2020-04-20 NOTE — Progress Notes (Signed)
[place] [phone]   [phone]  OFFICE PROGRESS NOTE  [name], MD [address] [place]  [place] [address]  DIAGNOSIS: Stage IV (T2b, N3, M1a) non-small cell lung cancer, adenocarcinoma presented with large right upper lobe lung mass in addition to mediastinal and right supraclavicular lymphadenopathy as well as bilateral pulmonary nodules diagnosed in February 2021.  MOLECULAR STUDY by [employer]:  KRASG12C, 4.3%, Binimetinib  [id number], 0.9%, Niraparib, Olaparib, Rucaparib,Talazoparib, Tazemetostat  [id number], 1.7%, None   PRIOR THERAPY: None  CURRENT THERAPY: Systemic chemotherapy with carboplatin for AUC of 5, Alimta 500 mg/M2 and Keytruda 200 mg IV every 3 weeks.  First dose December 31, 2019.  Status post 5 cycles.  INTERVAL HISTORY: Kevin Lambert. 64 y.o. male returns to the clinic today for follow-up visit.  The patient is feeling fine today with no concerning complaints.  He denied having any chest pain but has shortness of breath with exertion with no cough or hemoptysis.  He noticed darkening of his skin with the chemotherapy.  He denied having any nausea, vomiting, diarrhea or constipation.  He denied having any headache or visual changes.  He has no recent weight loss or night sweats.  The patient is here today for evaluation before starting cycle #6 of his treatment.   MEDICAL HISTORY: Past Medical History:  Diagnosis Date  . Arthritis   . Depression   . DM (diabetes mellitus) ([place])    type II  . Dyslipidemia   . Dyspnea    unable to walk much - he thinks it is from   . GERD (gastroesophageal reflux disease)   . Hepatitis    Hepatitis C- treated  . History of kidney stones   . HTN (hypertension)   . Neuropathy   . nscl ca dx'd 10/2019  . OSA on CPAP   . PTSD (post-traumatic stress disorder)     ALLERGIES:  is allergic to metformin and related.  MEDICATIONS:  Current Outpatient Medications    Medication Sig Dispense Refill  . albuterol (VENTOLIN HFA) 108 (90 Base) MCG/ACT inhaler Inhale 2 puffs into the lungs every 4 (four) hours as needed for wheezing or shortness of breath. (Patient not taking: Reported on 03/09/2020) 18 g 3  . amLODipine (NORVASC) 10 MG tablet Take 10 mg by mouth daily.    Marland Kitchen aspirin EC 81 MG tablet Take 81 mg by mouth daily.    Marland Kitchen atorvastatin (LIPITOR) 10 MG tablet Take 10 mg by mouth daily.    . bisacodyl (DULCOLAX) 5 MG EC tablet Take 5 mg by mouth daily as needed for moderate constipation.    . calcium carbonate (TUMS - DOSED IN MG ELEMENTAL CALCIUM) 500 MG chewable tablet Chew 2 tablets by mouth daily as needed for indigestion or heartburn.    . folic acid (FOLVITE) 1 MG tablet Take 1 tablet (1 mg total) by mouth daily. Pt needs to pick up today and start ASAP . He will pay out of pocket. He is waiting on the [place] to send his folic acid which will be next week. 7 tablet 0  . glipiZIDE (GLUCOTROL) 5 MG tablet Take 5 mg by mouth daily before breakfast.     . lidocaine-prilocaine (EMLA) cream Apply to Port-A-Cath site 30-60-minute before treatment. 30 g 0  . magic mouthwash SOLN Take 5 mLs by mouth 4 (four) times daily as needed for mouth pain. Swish and swallow or spit 240 mL 2  . Menthol ([employer]  LOZENGES MT) Use as directed 1 lozenge in the mouth or throat daily as needed (throat).     . naproxen (NAPROSYN) 500 MG tablet Take 500 mg by mouth 2 (two) times daily with a meal.    . pregabalin (LYRICA) 25 MG capsule Take 1 capsule (25 mg total) by mouth daily. 30 capsule 1  . psyllium (METAMUCIL) 58.6 % powder Take 1 packet by mouth daily.    . sildenafil (VIAGRA) 100 MG tablet Take 100 mg by mouth daily as needed for erectile dysfunction.    . triamterene-hydrochlorothiazide (MAXZIDE-25) 37.5-25 MG tablet Take 1 tablet by mouth daily as needed (swelling).     Marland Kitchen umeclidinium-vilanterol (ANORO ELLIPTA) 62.5-25 MCG/INH AEPB Inhale 1 puff into the lungs daily. (Patient not  taking: Reported on 03/09/2020) 60 each 11   No current facility-administered medications for this visit.    SURGICAL HISTORY:  Past Surgical History:  Procedure Laterality Date  . BIOPSY OF MEDIASTINAL MASS  12/10/2019   Procedure: BIOPSY OF MEDIASTINAL MASS;  Surgeon: Garner Nash, DO;  Location: [facility];  Service: Pulmonary;;  right upper lobe  . BRONCHIAL NEEDLE ASPIRATION BIOPSY  12/10/2019   Procedure: BRONCHIAL NEEDLE ASPIRATION BIOPSIES;  Surgeon: Garner Nash, DO;  Location: [facility];  Service: Pulmonary;;  . COLONOSCOPY    . IR IMAGING GUIDED PORT INSERTION  12/31/2019  . kidney stone    . URETERAL STENT PLACEMENT    . Ureteral Stent Removed    . VIDEO BRONCHOSCOPY WITH ENDOBRONCHIAL ULTRASOUND N/A 12/10/2019   Procedure: VIDEO BRONCHOSCOPY WITH ENDOBRONCHIAL ULTRASOUND;  Surgeon: Garner Nash, DO;  Location: [facility];  Service: Pulmonary;  Laterality: N/A;    REVIEW OF SYSTEMS:  A comprehensive review of systems was negative except for: Constitutional: positive for fatigue Respiratory: positive for dyspnea on exertion   PHYSICAL EXAMINATION: General appearance: alert, cooperative and no distress Head: Normocephalic, without obvious abnormality, atraumatic Neck: no adenopathy, no JVD, supple, symmetrical, trachea midline and thyroid not enlarged, symmetric, no tenderness/mass/nodules Lymph nodes: Cervical, supraclavicular, and axillary nodes normal. Resp: clear to auscultation bilaterally Back: symmetric, no curvature. ROM normal. No CVA tenderness. Cardio: regular rate and rhythm, S1, S2 normal, no murmur, click, rub or gallop GI: soft, non-tender; bowel sounds normal; no masses,  no organomegaly Extremities: extremities normal, atraumatic, no cyanosis or edema  ECOG PERFORMANCE STATUS: 1 - Symptomatic but completely ambulatory  Blood pressure 124/73, pulse 92, temperature 97.9 F (36.6 C), temperature source Temporal, resp. rate 18, height 5\' 9"  (1.753  m), weight 240 lb 4.8 oz (109 kg), SpO2 100 %.  LABORATORY DATA: Lab Results  Component Value Date   WBC 5.4 03/30/2020   HGB 11.8 (L) 03/30/2020   HCT 34.7 (L) 03/30/2020   MCV 93.5 03/30/2020   PLT 326 03/30/2020      Chemistry      Component Value Date/Time   NA 134 (L) 03/30/2020 1100   K 3.8 03/30/2020 1100   CL 97 (L) 03/30/2020 1100   CO2 27 03/30/2020 1100   BUN 14 03/30/2020 1100   CREATININE 1.05 03/30/2020 1100      Component Value Date/Time   CALCIUM 10.2 03/30/2020 1100   ALKPHOS 98 03/30/2020 1100   AST 20 03/30/2020 1100   ALT 30 03/30/2020 1100   BILITOT 0.3 03/30/2020 1100       RADIOGRAPHIC STUDIES: No results found.  ASSESSMENT AND PLAN: This is a very pleasant 64 years old African-American male recently diagnosed with a stage  IV non-small cell lung cancer, adenocarcinoma with no actionable mutations presented with large right lower lobe lung mass in addition to mediastinal and right supraclavicular lymphadenopathy as well as bilateral pulmonary nodules diagnosed in February 2021. I explained to the patient that he has incurable condition and all the treatment options will be of palliative nature. The patient has no actionable mutations on the recent molecular studies and he is not a candidate for the [facility] clinical trial. He started systemic chemotherapy with carboplatin, Alimta and Keytruda status post 5 cycles.   The patient has been tolerating this treatment well with no concerning adverse effect except for darkening of his skin and mild fatigue. I recommended for him to proceed with cycle #6 today as planned. I will see him back for follow-up visit in 3 weeks for evaluation with repeat CT scan of the chest, abdomen pelvis for restaging of his disease. The patient was advised to call immediately if he has any concerning symptoms in the interval. The patient voices understanding of current disease status and treatment options and is in agreement  with the current care plan.  All questions were answered. The patient knows to call the clinic with any problems, questions or concerns. We can certainly see the patient much sooner if necessary.  Disclaimer: This note was dictated with voice recognition software. Similar sounding words can inadvertently be transcribed and may not be corrected upon review.

## 2020-04-20 NOTE — Patient Instructions (Signed)
[facility] Discharge Instructions for Patients Receiving Chemotherapy  Today you received the following chemotherapy agents: Keytruda, Alimta  To help prevent nausea and vomiting after your treatment, we encourage you to take your nausea medication as directed.    If you develop nausea and vomiting that is not controlled by your nausea medication, call the clinic.   BELOW ARE SYMPTOMS THAT SHOULD BE REPORTED IMMEDIATELY:  *FEVER GREATER THAN 100.5 F  *CHILLS WITH OR WITHOUT FEVER  NAUSEA AND VOMITING THAT IS NOT CONTROLLED WITH YOUR NAUSEA MEDICATION  *UNUSUAL SHORTNESS OF BREATH  *UNUSUAL BRUISING OR BLEEDING  TENDERNESS IN MOUTH AND THROAT WITH OR WITHOUT PRESENCE OF ULCERS  *URINARY PROBLEMS  *BOWEL PROBLEMS  UNUSUAL RASH Items with * indicate a potential emergency and should be followed up as soon as possible.  Feel free to call the clinic should you have any questions or concerns. The clinic phone number is [phone].  Please show the [facility] at check-in to the Emergency Department and triage nurse.

## 2020-04-23 ENCOUNTER — Telehealth: Payer: Self-pay

## 2020-04-23 NOTE — Telephone Encounter (Signed)
TC from Pt stating that he has an appointment with Dr Valeta Harms for a pulmonary function test. Pt stated he would be out of town on the date it is scheduled. Informed Pt to call Dr Valeta Harms office to reschedule his appointments. Pt. Verbalized understanding.

## 2020-04-28 ENCOUNTER — Other Ambulatory Visit (HOSPITAL_COMMUNITY): Payer: Non-veteran care

## 2020-05-01 ENCOUNTER — Ambulatory Visit: Payer: Non-veteran care | Admitting: Pulmonary Disease

## 2020-05-06 ENCOUNTER — Telehealth: Payer: Self-pay | Admitting: Medical Oncology

## 2020-05-06 NOTE — Telephone Encounter (Signed)
appts confirmed for next week. He did not see Dr Valeta Harms and will call him today to r/s.

## 2020-05-08 ENCOUNTER — Other Ambulatory Visit: Payer: Self-pay

## 2020-05-08 ENCOUNTER — Other Ambulatory Visit: Payer: Self-pay | Admitting: Medical Oncology

## 2020-05-08 ENCOUNTER — Ambulatory Visit (HOSPITAL_COMMUNITY)
Admission: RE | Admit: 2020-05-08 | Discharge: 2020-05-08 | Disposition: A | Discharge status: Home / Self Care | Payer: No Typology Code available for payment source | Source: Ambulatory Visit | Attending: Internal Medicine | Admitting: Internal Medicine

## 2020-05-08 DIAGNOSIS — C3491 Malignant neoplasm of unspecified part of right bronchus or lung: Secondary | ICD-10-CM

## 2020-05-08 DIAGNOSIS — C3490 Malignant neoplasm of unspecified part of unspecified bronchus or lung: Secondary | ICD-10-CM

## 2020-05-08 MED ORDER — IOHEXOL 300 MG/ML  SOLN
100.0000 mL | Freq: Once | INTRAMUSCULAR | Status: AC | PRN
  Administered 2020-05-08: 100 mL via INTRAVENOUS

## 2020-05-08 MED ORDER — HEPARIN SOD (PORK) LOCK FLUSH 100 UNIT/ML IV SOLN
INTRAVENOUS | Status: AC
  Filled 2020-05-08: qty 5

## 2020-05-08 MED ORDER — SODIUM CHLORIDE (PF) 0.9 % IJ SOLN
INTRAMUSCULAR | Status: AC
  Filled 2020-05-08: qty 50

## 2020-05-12 ENCOUNTER — Inpatient Hospital Stay: Payer: No Typology Code available for payment source

## 2020-05-12 ENCOUNTER — Other Ambulatory Visit: Payer: Self-pay

## 2020-05-12 ENCOUNTER — Inpatient Hospital Stay: Payer: No Typology Code available for payment source | Attending: Internal Medicine | Admitting: Internal Medicine

## 2020-05-12 ENCOUNTER — Encounter: Payer: Self-pay | Admitting: Internal Medicine

## 2020-05-12 VITALS — BP 151/72 | HR 80 | Temp 98.7°F | Resp 20 | Ht 69.0 in | Wt 238.4 lb

## 2020-05-12 DIAGNOSIS — C3491 Malignant neoplasm of unspecified part of right bronchus or lung: Secondary | ICD-10-CM

## 2020-05-12 DIAGNOSIS — I10 Essential (primary) hypertension: Secondary | ICD-10-CM | POA: Insufficient documentation

## 2020-05-12 DIAGNOSIS — Z5112 Encounter for antineoplastic immunotherapy: Secondary | ICD-10-CM | POA: Insufficient documentation

## 2020-05-12 DIAGNOSIS — Z79899 Other long term (current) drug therapy: Secondary | ICD-10-CM | POA: Insufficient documentation

## 2020-05-12 DIAGNOSIS — Z5111 Encounter for antineoplastic chemotherapy: Secondary | ICD-10-CM | POA: Diagnosis not present

## 2020-05-12 DIAGNOSIS — Z720 Tobacco use: Secondary | ICD-10-CM

## 2020-05-12 DIAGNOSIS — C3411 Malignant neoplasm of upper lobe, right bronchus or lung: Secondary | ICD-10-CM | POA: Diagnosis present

## 2020-05-12 DIAGNOSIS — Z95828 Presence of other vascular implants and grafts: Secondary | ICD-10-CM

## 2020-05-12 LAB — CBC WITH DIFFERENTIAL (CANCER CENTER ONLY)
Abs Immature Granulocytes: 0.03 10*3/uL (ref 0.00–0.07)
Basophils Absolute: 0 10*3/uL (ref 0.0–0.1)
Basophils Relative: 0 %
Eosinophils Absolute: 0.3 10*3/uL (ref 0.0–0.5)
Eosinophils Relative: 5 %
HCT: 35.5 % — ABNORMAL LOW (ref 39.0–52.0)
Hemoglobin: 12 g/dL — ABNORMAL LOW (ref 13.0–17.0)
Immature Granulocytes: 1 %
Lymphocytes Relative: 31 %
Lymphs Abs: 1.7 10*3/uL (ref 0.7–4.0)
MCH: 33.9 pg (ref 26.0–34.0)
MCHC: 33.8 g/dL (ref 30.0–36.0)
MCV: 100.3 fL — ABNORMAL HIGH (ref 80.0–100.0)
Monocytes Absolute: 0.8 10*3/uL (ref 0.1–1.0)
Monocytes Relative: 14 %
Neutro Abs: 2.8 10*3/uL (ref 1.7–7.7)
Neutrophils Relative %: 49 %
Platelet Count: 272 10*3/uL (ref 150–400)
RBC: 3.54 MIL/uL — ABNORMAL LOW (ref 4.22–5.81)
RDW: 16.3 % — ABNORMAL HIGH (ref 11.5–15.5)
WBC Count: 5.6 10*3/uL (ref 4.0–10.5)
nRBC: 0 % (ref 0.0–0.2)

## 2020-05-12 LAB — CMP (CANCER CENTER ONLY)
ALT: 48 U/L — ABNORMAL HIGH (ref 0–44)
AST: 41 U/L (ref 15–41)
Albumin: 3.4 g/dL — ABNORMAL LOW (ref 3.5–5.0)
Alkaline Phosphatase: 87 U/L (ref 38–126)
Anion gap: 9 (ref 5–15)
BUN: 8 mg/dL (ref 8–23)
CO2: 26 mmol/L (ref 22–32)
Calcium: 10.3 mg/dL (ref 8.9–10.3)
Chloride: 101 mmol/L (ref 98–111)
Creatinine: 0.99 mg/dL (ref 0.61–1.24)
GFR, Est AFR Am: >60 mL/min (ref >60)
GFR, Estimated: >60 mL/min (ref >60)
Glucose, Bld: 162 mg/dL — ABNORMAL HIGH (ref 70–99)
Potassium: 3.9 mmol/L (ref 3.5–5.1)
Sodium: 136 mmol/L (ref 135–145)
Total Bilirubin: 0.3 mg/dL (ref 0.3–1.2)
Total Protein: 7.2 g/dL (ref 6.5–8.1)

## 2020-05-12 LAB — TSH: TSH: 2.248 u[IU]/mL (ref 0.320–4.118)

## 2020-05-12 MED ORDER — CYANOCOBALAMIN 1000 MCG/ML IJ SOLN
INTRAMUSCULAR | Status: AC
  Filled 2020-05-12: qty 1

## 2020-05-12 MED ORDER — SODIUM CHLORIDE 0.9 % IV SOLN
200.0000 mg | Freq: Once | INTRAVENOUS | Status: AC
  Administered 2020-05-12: 200 mg via INTRAVENOUS
  Filled 2020-05-12: qty 8

## 2020-05-12 MED ORDER — SODIUM CHLORIDE 0.9% FLUSH
10.0000 mL | Freq: Once | INTRAVENOUS | Status: AC
  Administered 2020-05-12: 10 mL
  Filled 2020-05-12: qty 10

## 2020-05-12 MED ORDER — SODIUM CHLORIDE 0.9% FLUSH
10.0000 mL | INTRAVENOUS | Status: DC | PRN
  Administered 2020-05-12: 10 mL
  Filled 2020-05-12: qty 10

## 2020-05-12 MED ORDER — HEPARIN SOD (PORK) LOCK FLUSH 100 UNIT/ML IV SOLN
500.0000 [IU] | Freq: Once | INTRAVENOUS | Status: AC | PRN
  Administered 2020-05-12: 500 [IU]
  Filled 2020-05-12: qty 5

## 2020-05-12 MED ORDER — SODIUM CHLORIDE 0.9 % IV SOLN
Freq: Once | INTRAVENOUS | Status: AC
  Filled 2020-05-12: qty 250

## 2020-05-12 MED ORDER — PROCHLORPERAZINE MALEATE 10 MG PO TABS
ORAL_TABLET | ORAL | Status: AC
  Filled 2020-05-12: qty 1

## 2020-05-12 MED ORDER — SODIUM CHLORIDE 0.9 % IV SOLN
500.0000 mg/m2 | Freq: Once | INTRAVENOUS | Status: AC
  Administered 2020-05-12: 1200 mg via INTRAVENOUS
  Filled 2020-05-12: qty 8

## 2020-05-12 MED ORDER — CYANOCOBALAMIN 1000 MCG/ML IJ SOLN
1000.0000 ug | Freq: Once | INTRAMUSCULAR | Status: AC
  Administered 2020-05-12: 1000 ug via INTRAMUSCULAR

## 2020-05-12 MED ORDER — PROCHLORPERAZINE MALEATE 10 MG PO TABS
10.0000 mg | ORAL_TABLET | Freq: Once | ORAL | Status: AC
  Administered 2020-05-12: 10 mg via ORAL

## 2020-05-12 NOTE — Patient Instructions (Signed)
[facility] Discharge Instructions for Patients Receiving Chemotherapy  Today you received the following chemotherapy agents Keytruda and Alimta.   To help prevent nausea and vomiting after your treatment, we encourage you to take your nausea medication as directed  If you develop nausea and vomiting that is not controlled by your nausea medication, call the clinic.   BELOW ARE SYMPTOMS THAT SHOULD BE REPORTED IMMEDIATELY:  *FEVER GREATER THAN 100.5 F  *CHILLS WITH OR WITHOUT FEVER  NAUSEA AND VOMITING THAT IS NOT CONTROLLED WITH YOUR NAUSEA MEDICATION  *UNUSUAL SHORTNESS OF BREATH  *UNUSUAL BRUISING OR BLEEDING  TENDERNESS IN MOUTH AND THROAT WITH OR WITHOUT PRESENCE OF ULCERS  *URINARY PROBLEMS  *BOWEL PROBLEMS  UNUSUAL RASH Items with * indicate a potential emergency and should be followed up as soon as possible.  Feel free to call the clinic you have any questions or concerns. The clinic phone number is [phone].  Please show the [facility] at check-in to the Emergency Department and triage nurse.

## 2020-05-12 NOTE — Patient Instructions (Signed)
Steps to Quit Smoking Smoking tobacco is the leading cause of preventable death. It can affect almost every organ in the body. Smoking puts you and people around you at risk for many serious, long-lasting (chronic) diseases. Quitting smoking can be hard, but it is one of the best things that you can do for your health. It is never too late to quit. How do I get ready to quit? When you decide to quit smoking, make a plan to help you succeed. Before you quit:  Pick a date to quit. Set a date within the next 2 weeks to give you time to prepare.  Write down the reasons why you are quitting. Keep this list in places where you will see it often.  Tell your family, friends, and co-workers that you are quitting. Their support is important.  Talk with your doctor about the choices that may help you quit.  Find out if your health insurance will pay for these treatments.  Know the people, places, things, and activities that make you want to smoke (triggers). Avoid them. What first steps can I take to quit smoking?  Throw away all cigarettes at home, at work, and in your car.  Throw away the things that you use when you smoke, such as ashtrays and lighters.  Clean your car. Make sure to empty the ashtray.  Clean your home, including curtains and carpets. What can I do to help me quit smoking? Talk with your doctor about taking medicines and seeing a counselor at the same time. You are more likely to succeed when you do both.  If you are pregnant or breastfeeding, talk with your doctor about counseling or other ways to quit smoking. Do not take medicine to help you quit smoking unless your doctor tells you to do so. To quit smoking: Quit right away  Quit smoking totally, instead of slowly cutting back on how much you smoke over a period of time.  Go to counseling. You are more likely to quit if you go to counseling sessions regularly. Take medicine You may take medicines to help you quit. Some  medicines need a prescription, and some you can buy over-the-counter. Some medicines may contain a drug called nicotine to replace the nicotine in cigarettes. Medicines may:  Help you to stop having the desire to smoke (cravings).  Help to stop the problems that come when you stop smoking (withdrawal symptoms). Your doctor may ask you to use:  Nicotine patches, gum, or lozenges.  Nicotine inhalers or sprays.  Non-nicotine medicine that is taken by mouth. Find resources Find resources and other ways to help you quit smoking and remain smoke-free after you quit. These resources are most helpful when you use them often. They include:  Online chats with a counselor.  Phone quitlines.  Printed self-help materials.  Support groups or group counseling.  Text messaging programs.  Mobile phone apps. Use apps on your mobile phone or tablet that can help you stick to your quit plan. There are many free apps for mobile phones and tablets as well as websites. Examples include Quit Guide from the CDC and smokefree.gov  What things can I do to make it easier to quit?   Talk to your family and friends. Ask them to support and encourage you.  Call a phone quitline (1-800-QUIT-NOW), reach out to support groups, or work with a counselor.  Ask people who smoke to not smoke around you.  Avoid places that make you want to smoke,   such as: ? Bars. ? Parties. ? Smoke-break areas at work.  Spend time with people who do not smoke.  Lower the stress in your life. Stress can make you want to smoke. Try these things to help your stress: ? Getting regular exercise. ? Doing deep-breathing exercises. ? Doing yoga. ? Meditating. ? Doing a body scan. To do this, close your eyes, focus on one area of your body at a time from head to toe. Notice which parts of your body are tense. Try to relax the muscles in those areas. How will I feel when I quit smoking? Day 1 to 3 weeks Within the first 24 hours,  you may start to have some problems that come from quitting tobacco. These problems are very bad 2-3 days after you quit, but they do not often last for more than 2-3 weeks. You may get these symptoms:  Mood swings.  Feeling restless, nervous, angry, or annoyed.  Trouble concentrating.  Dizziness.  Strong desire for high-sugar foods and nicotine.  Weight gain.  Trouble pooping (constipation).  Feeling like you may vomit (nausea).  Coughing or a sore throat.  Changes in how the medicines that you take for other issues work in your body.  Depression.  Trouble sleeping (insomnia). Week 3 and afterward After the first 2-3 weeks of quitting, you may start to notice more positive results, such as:  Better sense of smell and taste.  Less coughing and sore throat.  Slower heart rate.  Lower blood pressure.  Clearer skin.  Better breathing.  Fewer sick days. Quitting smoking can be hard. Do not give up if you fail the first time. Some people need to try a few times before they succeed. Do your best to stick to your quit plan, and talk with your doctor if you have any questions or concerns. Summary  Smoking tobacco is the leading cause of preventable death. Quitting smoking can be hard, but it is one of the best things that you can do for your health.  When you decide to quit smoking, make a plan to help you succeed.  Quit smoking right away, not slowly over a period of time.  When you start quitting, seek help from your doctor, family, or friends. This information is not intended to replace advice given to you by your health care provider. Make sure you discuss any questions you have with your health care provider. Document Revised: 07/19/2019 Document Reviewed: 01/12/2019 Elsevier Patient Education  2020 [employer].  

## 2020-05-12 NOTE — Progress Notes (Signed)
[place] Telephone:([phone]) [phone]   Fax:(336) [phone]  OFFICE PROGRESS NOTE  [name], MD [address] [place]   [id number]  DIAGNOSIS: Stage IV (T2b, N3, M1a) non-small cell lung cancer, adenocarcinoma presented with large right upper lobe lung mass in addition to mediastinal and right supraclavicular lymphadenopathy as well as bilateral pulmonary nodules diagnosed in February 2021.  MOLECULAR STUDY by [employer]:  KRASG12C, 4.3%, Binimetinib  [id number], 0.9%, Niraparib, Olaparib, Rucaparib,Talazoparib, Tazemetostat  [id number], 1.7%, None   PRIOR THERAPY: None  CURRENT THERAPY: Systemic chemotherapy with carboplatin for AUC of 5, Alimta 500 mg/M2 and Keytruda 200 mg IV every 3 weeks.  First dose December 31, 2019.  Status post 6 cycles.  INTERVAL HISTORY: Kevin Lambert. 64 y.o. male returns to the clinic today for follow-up visit.  The patient is feeling fine today with no concerning complaints except for the baseline shortness of breath increased with exertion.  He denied having any chest pain, cough or hemoptysis.  He denied having any fever or chills.  He has no nausea, vomiting, diarrhea or constipation.  He has no headache or visual changes.  He has no recent weight loss or night sweats.  He continues to tolerate his maintenance treatment with Alimta and Keytruda fairly well.  The patient has repeat CT scan of the chest, abdomen pelvis performed recently and he is here for evaluation and discussion of his scan results.   MEDICAL HISTORY: Past Medical History:  Diagnosis Date  . Arthritis   . Depression   . DM (diabetes mellitus) ([place])    type II  . Dyslipidemia   . Dyspnea    unable to walk much - he thinks it is from   . GERD (gastroesophageal reflux disease)   . Hepatitis    Hepatitis C- treated  . History of kidney stones   . HTN (hypertension)   . Neuropathy   . nscl ca dx'd 10/2019  . OSA on CPAP   . PTSD (post-traumatic  stress disorder)     ALLERGIES:  is allergic to metformin and related.  MEDICATIONS:  Current Outpatient Medications  Medication Sig Dispense Refill  . albuterol (VENTOLIN HFA) 108 (90 Base) MCG/ACT inhaler Inhale 2 puffs into the lungs every 4 (four) hours as needed for wheezing or shortness of breath. (Patient not taking: Reported on 03/09/2020) 18 g 3  . amLODipine (NORVASC) 10 MG tablet Take 10 mg by mouth daily.    Marland Kitchen aspirin EC 81 MG tablet Take 81 mg by mouth daily.    Marland Kitchen atorvastatin (LIPITOR) 10 MG tablet Take 10 mg by mouth daily.    . bisacodyl (DULCOLAX) 5 MG EC tablet Take 5 mg by mouth daily as needed for moderate constipation.    . calcium carbonate (TUMS - DOSED IN MG ELEMENTAL CALCIUM) 500 MG chewable tablet Chew 2 tablets by mouth daily as needed for indigestion or heartburn.    . folic acid (FOLVITE) 1 MG tablet Take 1 tablet (1 mg total) by mouth daily. Pt needs to pick up today and start ASAP . He will pay out of pocket. He is waiting on the [place] to send his folic acid which will be next week. 7 tablet 0  . glipiZIDE (GLUCOTROL) 5 MG tablet Take 5 mg by mouth daily before breakfast.     . lidocaine-prilocaine (EMLA) cream Apply to Port-A-Cath site 30-60-minute before treatment. (Patient not taking: Reported on 04/20/2020) 30 g 0  . magic mouthwash  SOLN Take 5 mLs by mouth 4 (four) times daily as needed for mouth pain. Swish and swallow or spit 240 mL 2  . Menthol ([employer] LOZENGES MT) Use as directed 1 lozenge in the mouth or throat daily as needed (throat).     . naproxen (NAPROSYN) 500 MG tablet Take 500 mg by mouth 2 (two) times daily with a meal. (Patient not taking: Reported on 04/20/2020)    . pregabalin (LYRICA) 25 MG capsule Take 1 capsule (25 mg total) by mouth daily. (Patient not taking: Reported on 04/20/2020) 30 capsule 1  . psyllium (METAMUCIL) 58.6 % powder Take 1 packet by mouth daily.    . sildenafil (VIAGRA) 100 MG tablet Take 100 mg by mouth daily as needed for  erectile dysfunction.    . triamterene-hydrochlorothiazide (MAXZIDE-25) 37.5-25 MG tablet Take 1 tablet by mouth daily as needed (swelling).     Marland Kitchen umeclidinium-vilanterol (ANORO ELLIPTA) 62.5-25 MCG/INH AEPB Inhale 1 puff into the lungs daily. (Patient not taking: Reported on 03/09/2020) 60 each 11   No current facility-administered medications for this visit.    SURGICAL HISTORY:  Past Surgical History:  Procedure Laterality Date  . BIOPSY OF MEDIASTINAL MASS  12/10/2019   Procedure: BIOPSY OF MEDIASTINAL MASS;  Surgeon: Garner Nash, DO;  Location: [facility];  Service: Pulmonary;;  right upper lobe  . BRONCHIAL NEEDLE ASPIRATION BIOPSY  12/10/2019   Procedure: BRONCHIAL NEEDLE ASPIRATION BIOPSIES;  Surgeon: Garner Nash, DO;  Location: [facility];  Service: Pulmonary;;  . COLONOSCOPY    . IR IMAGING GUIDED PORT INSERTION  12/31/2019  . kidney stone    . URETERAL STENT PLACEMENT    . Ureteral Stent Removed    . VIDEO BRONCHOSCOPY WITH ENDOBRONCHIAL ULTRASOUND N/A 12/10/2019   Procedure: VIDEO BRONCHOSCOPY WITH ENDOBRONCHIAL ULTRASOUND;  Surgeon: Garner Nash, DO;  Location: [facility];  Service: Pulmonary;  Laterality: N/A;    REVIEW OF SYSTEMS:  Constitutional: positive for fatigue Eyes: negative Ears, nose, mouth, throat, and face: negative Respiratory: positive for dyspnea on exertion Cardiovascular: negative Gastrointestinal: negative Genitourinary:negative Integument/breast: negative Hematologic/lymphatic: negative Musculoskeletal:negative Neurological: negative Behavioral/Psych: negative Endocrine: negative Allergic/Immunologic: negative   PHYSICAL EXAMINATION: General appearance: alert, cooperative, fatigued and no distress Head: Normocephalic, without obvious abnormality, atraumatic Neck: no adenopathy, no JVD, supple, symmetrical, trachea midline and thyroid not enlarged, symmetric, no tenderness/mass/nodules Lymph nodes: Cervical, supraclavicular, and  axillary nodes normal. Resp: clear to auscultation bilaterally Back: symmetric, no curvature. ROM normal. No CVA tenderness. Cardio: regular rate and rhythm, S1, S2 normal, no murmur, click, rub or gallop GI: soft, non-tender; bowel sounds normal; no masses,  no organomegaly Extremities: extremities normal, atraumatic, no cyanosis or edema Neurologic: Alert and oriented X 3, normal strength and tone. Normal symmetric reflexes. Normal coordination and gait  ECOG PERFORMANCE STATUS: 1 - Symptomatic but completely ambulatory  Blood pressure (!) 151/72, pulse 80, temperature 98.7 F (37.1 C), temperature source Temporal, resp. rate 20, height 5\' 9"  (1.753 m), weight 238 lb 6.4 oz (108.1 kg), SpO2 100 %.  LABORATORY DATA: Lab Results  Component Value Date   WBC 6.4 04/20/2020   HGB 12.1 (L) 04/20/2020   HCT 35.6 (L) 04/20/2020   MCV 96.0 04/20/2020   PLT 307 04/20/2020      Chemistry      Component Value Date/Time   NA 138 04/20/2020 1158   K 4.1 04/20/2020 1158   CL 100 04/20/2020 1158   CO2 26 04/20/2020 1158   BUN 12 04/20/2020 1158  CREATININE 1.07 04/20/2020 1158      Component Value Date/Time   CALCIUM 10.1 04/20/2020 1158   ALKPHOS 91 04/20/2020 1158   AST 26 04/20/2020 1158   ALT 43 04/20/2020 1158   BILITOT 0.3 04/20/2020 1158       RADIOGRAPHIC STUDIES: CT Chest W Contrast  Result Date: 05/11/2020 CLINICAL DATA:  Non-small cell lung cancer staging, history of RIGHT lung cancer diagnosed in December of 2020 status post chemo and immunotherapy. EXAM: CT CHEST, ABDOMEN, AND PELVIS WITH CONTRAST TECHNIQUE: Multidetector CT imaging of the chest, abdomen and pelvis was performed following the standard protocol during bolus administration of intravenous contrast. CONTRAST:  15mL OMNIPAQUE IOHEXOL 300 MG/ML  SOLN COMPARISON:  03/02/2020 FINDINGS: CT CHEST FINDINGS Cardiovascular: RIGHT-sided Port-A-Cath terminates at the caval to atrial junction. Heart size is normal  without pericardial effusion. Scattered coronary artery calcifications. Calcified atheromatous plaque in the thoracic aorta. Venous phase assessment of central pulmonary vasculature is unremarkable. Mediastinum/Nodes: Thoracic inlet structures are normal. No axillary lymphadenopathy. RIGHT paratracheal nodal enlargement with similar appearance (image 20, series 2) 13 mm short axis stable compared to the prior study. Mild subcarinal nodal enlargement also unchanged as well as mild fullness of the RIGHT hilum. No contralateral hilar adenopathy.  No AP window adenopathy. Lungs/Pleura: RIGHT upper lobe mass distorting the fissure (image 55, series 4) 4.1 x 3.1 cm, within 1 mm of previous measurements. Associated fissural thickening along the minor and major fissure in the RIGHT chest is similar though there is a new area of subtle nodularity along the major fissure (image 75, series 4) approximately 7 mm and another area associated with some mild fissural thickening that extends more inferiorly than it did on the prior study on image 79 of series 4. No pleural effusion. LEFT chest is clear. Airways are patent. Musculoskeletal: No acute musculoskeletal process of the bony thorax. See below for full musculoskeletal detail. CT ABDOMEN PELVIS FINDINGS Hepatobiliary: Signs of hepatic steatosis. No focal, suspicious hepatic lesion. No pericholecystic stranding. Portal vein is patent. Pancreas: Pancreas without ductal dilation or inflammation. Spleen: Spleen normal size and contour. Adrenals/Urinary Tract: 2.3 cm RIGHT adrenal lesion unchanged. Nodularity of the LEFT adrenal is similar. Renal vascular calcifications and presumed cysts of bilateral kidneys. Urinary bladder is normal. Nephrolithiasis on the LEFT with interpolar calculus measuring 6 mm similar to the previous exam. Stomach/Bowel: Stomach and small bowel with normal appearance. No perienteric stranding. No pericolonic stranding. Normal appendix.  Vascular/Lymphatic: Calcified and noncalcified plaque of the abdominal aorta with similar appearance. No adenopathy in the upper abdomen. No adenopathy in the retroperitoneum. No pelvic lymphadenopathy. Reproductive: Prostate with heterogeneity, nonspecific finding on CT. Other: Fat containing RIGHT inguinal hernia similar to prior study as is an umbilical hernia also containing fat. Musculoskeletal: No acute musculoskeletal process. Spinal degenerative changes. IMPRESSION: 1. RIGHT upper lobe mass distorting the fissure is within 1 mm of previous measurements. 2. New areas of nodularity along the minor fissure in the RIGHT chest with generalized fissural thickening extending more inferiorly than on previous imaging suspicious given patient history for disease recurrence/metastases. Consider short interval follow-up chest CT for further assessment. Given size PET is unlikely to add additional information at this time. 3. Stable mediastinal adenopathy. 4. Stable adrenal RIGHT adenoma and LEFT adrenal nodularity. 5. No evidence of metastatic disease in the abdomen or pelvis. 6. Hepatic steatosis. 7. Aortic atherosclerosis. Aortic Atherosclerosis (ICD10-I70.0). Electronically Signed   By: Zetta Bills M.D.   On: 05/11/2020 18:00   CT Abdomen  Pelvis W Contrast  Result Date: 05/11/2020 CLINICAL DATA:  Non-small cell lung cancer staging, history of RIGHT lung cancer diagnosed in December of 2020 status post chemo and immunotherapy. EXAM: CT CHEST, ABDOMEN, AND PELVIS WITH CONTRAST TECHNIQUE: Multidetector CT imaging of the chest, abdomen and pelvis was performed following the standard protocol during bolus administration of intravenous contrast. CONTRAST:  165mL OMNIPAQUE IOHEXOL 300 MG/ML  SOLN COMPARISON:  03/02/2020 FINDINGS: CT CHEST FINDINGS Cardiovascular: RIGHT-sided Port-A-Cath terminates at the caval to atrial junction. Heart size is normal without pericardial effusion. Scattered coronary artery  calcifications. Calcified atheromatous plaque in the thoracic aorta. Venous phase assessment of central pulmonary vasculature is unremarkable. Mediastinum/Nodes: Thoracic inlet structures are normal. No axillary lymphadenopathy. RIGHT paratracheal nodal enlargement with similar appearance (image 20, series 2) 13 mm short axis stable compared to the prior study. Mild subcarinal nodal enlargement also unchanged as well as mild fullness of the RIGHT hilum. No contralateral hilar adenopathy.  No AP window adenopathy. Lungs/Pleura: RIGHT upper lobe mass distorting the fissure (image 55, series 4) 4.1 x 3.1 cm, within 1 mm of previous measurements. Associated fissural thickening along the minor and major fissure in the RIGHT chest is similar though there is a new area of subtle nodularity along the major fissure (image 75, series 4) approximately 7 mm and another area associated with some mild fissural thickening that extends more inferiorly than it did on the prior study on image 79 of series 4. No pleural effusion. LEFT chest is clear. Airways are patent. Musculoskeletal: No acute musculoskeletal process of the bony thorax. See below for full musculoskeletal detail. CT ABDOMEN PELVIS FINDINGS Hepatobiliary: Signs of hepatic steatosis. No focal, suspicious hepatic lesion. No pericholecystic stranding. Portal vein is patent. Pancreas: Pancreas without ductal dilation or inflammation. Spleen: Spleen normal size and contour. Adrenals/Urinary Tract: 2.3 cm RIGHT adrenal lesion unchanged. Nodularity of the LEFT adrenal is similar. Renal vascular calcifications and presumed cysts of bilateral kidneys. Urinary bladder is normal. Nephrolithiasis on the LEFT with interpolar calculus measuring 6 mm similar to the previous exam. Stomach/Bowel: Stomach and small bowel with normal appearance. No perienteric stranding. No pericolonic stranding. Normal appendix. Vascular/Lymphatic: Calcified and noncalcified plaque of the abdominal  aorta with similar appearance. No adenopathy in the upper abdomen. No adenopathy in the retroperitoneum. No pelvic lymphadenopathy. Reproductive: Prostate with heterogeneity, nonspecific finding on CT. Other: Fat containing RIGHT inguinal hernia similar to prior study as is an umbilical hernia also containing fat. Musculoskeletal: No acute musculoskeletal process. Spinal degenerative changes. IMPRESSION: 1. RIGHT upper lobe mass distorting the fissure is within 1 mm of previous measurements. 2. New areas of nodularity along the minor fissure in the RIGHT chest with generalized fissural thickening extending more inferiorly than on previous imaging suspicious given patient history for disease recurrence/metastases. Consider short interval follow-up chest CT for further assessment. Given size PET is unlikely to add additional information at this time. 3. Stable mediastinal adenopathy. 4. Stable adrenal RIGHT adenoma and LEFT adrenal nodularity. 5. No evidence of metastatic disease in the abdomen or pelvis. 6. Hepatic steatosis. 7. Aortic atherosclerosis. Aortic Atherosclerosis (ICD10-I70.0). Electronically Signed   By: Zetta Bills M.D.   On: 05/11/2020 18:00    ASSESSMENT AND PLAN: This is a very pleasant 64 years old African-American male recently diagnosed with a stage IV non-small cell lung cancer, adenocarcinoma with no actionable mutations presented with large right lower lobe lung mass in addition to mediastinal and right supraclavicular lymphadenopathy as well as bilateral pulmonary nodules diagnosed in  February 2021. I explained to the patient that he has incurable condition and all the treatment options will be of palliative nature. The patient has no actionable mutations on the recent molecular studies and he is not a candidate for the [facility] clinical trial. He started systemic chemotherapy with carboplatin, Alimta and Keytruda status post 6 cycles.  Starting from cycle #5 the patient is on  maintenance treatment with Alimta and Keytruda every 3 weeks. He has been tolerating this treatment fairly well with no concerning adverse effects. He had repeat CT scan of the chest, abdomen pelvis performed recently.  I personally and independently reviewed the scans and discussed the results with the patient today. His scan showed no concerning findings for disease progression but there was a new area of nodularity along the major fissure in the right chest with generalized fissural thickening that could be suspicious for disease recurrence/metastasis.  There is no other concerning areas of disease progression. I recommended for the patient to continue his current maintenance treatment with Alimta and Keytruda every 3 weeks.  I will continue to monitor the thickening of the fissural area closely on the upcoming imaging studies. The patient agreed to the current plan. He will come back for follow-up visit in 3 weeks for evaluation before the next cycle of his treatment. For the hypertension, he was strongly advised to take his blood pressure medication as prescribed and to monitor it closely at home. The patient was advised to call immediately if he has any concerning symptoms in the interval. The patient voices understanding of current disease status and treatment options and is in agreement with the current care plan.  All questions were answered. The patient knows to call the clinic with any problems, questions or concerns. We can certainly see the patient much sooner if necessary.  Disclaimer: This note was dictated with voice recognition software. Similar sounding words can inadvertently be transcribed and may not be corrected upon review.

## 2020-05-14 ENCOUNTER — Telehealth: Payer: Self-pay | Admitting: Internal Medicine

## 2020-05-14 NOTE — Telephone Encounter (Signed)
Scheduled appt per 7/6 los - pt to get an updated schedule next visit.

## 2020-05-21 ENCOUNTER — Encounter: Payer: Self-pay | Admitting: Pulmonary Disease

## 2020-05-21 ENCOUNTER — Ambulatory Visit (INDEPENDENT_AMBULATORY_CARE_PROVIDER_SITE_OTHER): Payer: No Typology Code available for payment source | Admitting: Pulmonary Disease

## 2020-05-21 ENCOUNTER — Other Ambulatory Visit: Payer: Self-pay

## 2020-05-21 VITALS — BP 128/82 | HR 82 | Temp 97.9°F | Ht 69.0 in | Wt 242.4 lb

## 2020-05-21 DIAGNOSIS — R0600 Dyspnea, unspecified: Secondary | ICD-10-CM

## 2020-05-21 DIAGNOSIS — R0609 Other forms of dyspnea: Secondary | ICD-10-CM | POA: Insufficient documentation

## 2020-05-21 DIAGNOSIS — C3491 Malignant neoplasm of unspecified part of right bronchus or lung: Secondary | ICD-10-CM

## 2020-05-21 DIAGNOSIS — Z9189 Other specified personal risk factors, not elsewhere classified: Secondary | ICD-10-CM

## 2020-05-21 DIAGNOSIS — Z720 Tobacco use: Secondary | ICD-10-CM | POA: Diagnosis not present

## 2020-05-21 DIAGNOSIS — Z0000 Encounter for general adult medical examination without abnormal findings: Secondary | ICD-10-CM

## 2020-05-21 MED ORDER — NICOTINE 21 MG/24HR TD PT24: 21.0000 mg | MEDICATED_PATCH | Freq: Every day | TRANSDERMAL | 3 refills | Status: DC

## 2020-05-21 MED ORDER — NICOTINE POLACRILEX 4 MG MT LOZG: 4.0000 mg | LOZENGE | OROMUCOSAL | 3 refills | Status: DC | PRN

## 2020-05-21 MED ORDER — UMECLIDINIUM-VILANTEROL 62.5-25 MCG/INH IN AEPB: 1.0000 | INHALATION_SPRAY | Freq: Every day | RESPIRATORY_TRACT | 0 refills | Status: DC

## 2020-05-21 NOTE — Assessment & Plan Note (Signed)
Suspected COPD given smoking history No formal pulmonary function testing  Plan: Obtain pulmonary function testing Therapeutic trial of Anoro Ellipta Patient educated on Anoro Ellipta device today Follow-up in 2 to 3 months with our office with pulmonary function testing

## 2020-05-21 NOTE — Assessment & Plan Note (Signed)
Previous diagnosis of obstructive sleep apnea per patient Previously managed on CPAP therapy He is unsure the severity of his previous diagnosis Mallampati 4  Plan: Home sleep study ordered Sleep consult ordered for 4 months from now

## 2020-05-21 NOTE — Assessment & Plan Note (Signed)
Plan: Continue follow-up with oncology Obtain pulmonary function testing Work to stop smoking

## 2020-05-21 NOTE — Progress Notes (Signed)
@Patient  ID: Kevin Lambert., male    DOB: 1956/01/28, 64 y.o.   MRN: [id number]  Chief Complaint  Patient presents with  . Follow-up    pt is here to discuss lung mass    Referring provider: [name], MD  HPI:  64 year old male current everyday smoker followed in our office for non-small cell lung cancer stage IV  PMH: Obstructive sleep apnea, hypertension, type 2 diabetes Smoker/ Smoking History: Current smoker.  6 to 7 cigarettes a day.  30-pack-year smoking history Maintenance: Anoro Ellipta Pt of: Dr. Valeta Harms  05/21/2020  - Visit   64 year old male current everyday smoker initially referred to our office in January/2021 for evaluation of abnormal CT chest as well as lung mass.  He is followed by Dr. Valeta Harms.  His last office visit was in February/2021.  Plan of care from that office visit was: Continue treatment with oncology as managed by Dr. Earlie Server.  It was recommended that he continue to not smoke.  He had quit smoking in February/2021 per the documentation.  At that point in time he was given a new diagnosis of stage IV non-small cell lung cancer consistent with adenocarcinoma found in the right upper lobe mass with mediastinal and supraclavicular adenopathy.  Patient was placed therapeutically on Anoro Ellipta.  Patient presenting to our office today.  He reports that he never started [employer].  He was unsure if he actually needed it.  He also has not been scheduled for a pulmonary function test.  We will review this today.  Patient has kept follow-ups with oncology.  He has not been scheduled for a COVID-19 vaccine.  He also has never received a pneumonia shot before.  He also rarely gets the flu shot.  We will discuss this today.  Patient has a history of obstructive sleep apnea, was previously treated with CPAP therapy although he is no longer managed on CPAP therapy.  Patient does still continue to smoke.  He did try to stop smoking in February/2021.  He is  interested in trying to stop smoking again.  We will discuss this today.    Questionaires / Pulmonary Flowsheets:   ACT:  No flowsheet data found.  MMRC: mMRC Dyspnea Scale mMRC Score  05/21/2020 3    Epworth:  No flowsheet data found.  Tests:   12/09/2019-PET scan-posterior right upper lobe mass is FDG avid and concerning for primary bronchogenic carcinoma there is associated postobstructive pneumonitis within the posterior and lateral right upper lobe, tumor also appears to extend into the superior segment of the right lower lobe, bilateral pulmonary nodules are also identified suspicious for missed metastatic disease, FDG avid right hilar, subcarinal, right paratracheal lymph nodes are identified compatible with metastatic adenopathy  03/02/2020-CT chest with contrast-response to therapy the right upper lobe lung mass and thoracic adenopathy since 2/1 4-20 21, resolution of pulmonary nodules consistent with response to therapy of pulmonary metastasis, no new or progressive disease, right adrenal adenoma, aortic arthrosclerosis, coronary artery arthrosclerosis and emphysema   FENO:  No results found for: NITRICOXIDE  PFT: No flowsheet data found.  WALK:  No flowsheet data found.  Imaging: CT Chest W Contrast  Result Date: 05/11/2020 CLINICAL DATA:  Non-small cell lung cancer staging, history of RIGHT lung cancer diagnosed in December of 2020 status post chemo and immunotherapy. EXAM: CT CHEST, ABDOMEN, AND PELVIS WITH CONTRAST TECHNIQUE: Multidetector CT imaging of the chest, abdomen and pelvis was performed following the standard protocol during bolus administration of intravenous  contrast. CONTRAST:  158mL OMNIPAQUE IOHEXOL 300 MG/ML  SOLN COMPARISON:  03/02/2020 FINDINGS: CT CHEST FINDINGS Cardiovascular: RIGHT-sided Port-A-Cath terminates at the caval to atrial junction. Heart size is normal without pericardial effusion. Scattered coronary artery calcifications. Calcified  atheromatous plaque in the thoracic aorta. Venous phase assessment of central pulmonary vasculature is unremarkable. Mediastinum/Nodes: Thoracic inlet structures are normal. No axillary lymphadenopathy. RIGHT paratracheal nodal enlargement with similar appearance (image 20, series 2) 13 mm short axis stable compared to the prior study. Mild subcarinal nodal enlargement also unchanged as well as mild fullness of the RIGHT hilum. No contralateral hilar adenopathy.  No AP window adenopathy. Lungs/Pleura: RIGHT upper lobe mass distorting the fissure (image 55, series 4) 4.1 x 3.1 cm, within 1 mm of previous measurements. Associated fissural thickening along the minor and major fissure in the RIGHT chest is similar though there is a new area of subtle nodularity along the major fissure (image 75, series 4) approximately 7 mm and another area associated with some mild fissural thickening that extends more inferiorly than it did on the prior study on image 79 of series 4. No pleural effusion. LEFT chest is clear. Airways are patent. Musculoskeletal: No acute musculoskeletal process of the bony thorax. See below for full musculoskeletal detail. CT ABDOMEN PELVIS FINDINGS Hepatobiliary: Signs of hepatic steatosis. No focal, suspicious hepatic lesion. No pericholecystic stranding. Portal vein is patent. Pancreas: Pancreas without ductal dilation or inflammation. Spleen: Spleen normal size and contour. Adrenals/Urinary Tract: 2.3 cm RIGHT adrenal lesion unchanged. Nodularity of the LEFT adrenal is similar. Renal vascular calcifications and presumed cysts of bilateral kidneys. Urinary bladder is normal. Nephrolithiasis on the LEFT with interpolar calculus measuring 6 mm similar to the previous exam. Stomach/Bowel: Stomach and small bowel with normal appearance. No perienteric stranding. No pericolonic stranding. Normal appendix. Vascular/Lymphatic: Calcified and noncalcified plaque of the abdominal aorta with similar  appearance. No adenopathy in the upper abdomen. No adenopathy in the retroperitoneum. No pelvic lymphadenopathy. Reproductive: Prostate with heterogeneity, nonspecific finding on CT. Other: Fat containing RIGHT inguinal hernia similar to prior study as is an umbilical hernia also containing fat. Musculoskeletal: No acute musculoskeletal process. Spinal degenerative changes. IMPRESSION: 1. RIGHT upper lobe mass distorting the fissure is within 1 mm of previous measurements. 2. New areas of nodularity along the minor fissure in the RIGHT chest with generalized fissural thickening extending more inferiorly than on previous imaging suspicious given patient history for disease recurrence/metastases. Consider short interval follow-up chest CT for further assessment. Given size PET is unlikely to add additional information at this time. 3. Stable mediastinal adenopathy. 4. Stable adrenal RIGHT adenoma and LEFT adrenal nodularity. 5. No evidence of metastatic disease in the abdomen or pelvis. 6. Hepatic steatosis. 7. Aortic atherosclerosis. Aortic Atherosclerosis (ICD10-I70.0). Electronically Signed   By: Zetta Bills M.D.   On: 05/11/2020 18:00   CT Abdomen Pelvis W Contrast  Result Date: 05/11/2020 CLINICAL DATA:  Non-small cell lung cancer staging, history of RIGHT lung cancer diagnosed in December of 2020 status post chemo and immunotherapy. EXAM: CT CHEST, ABDOMEN, AND PELVIS WITH CONTRAST TECHNIQUE: Multidetector CT imaging of the chest, abdomen and pelvis was performed following the standard protocol during bolus administration of intravenous contrast. CONTRAST:  165mL OMNIPAQUE IOHEXOL 300 MG/ML  SOLN COMPARISON:  03/02/2020 FINDINGS: CT CHEST FINDINGS Cardiovascular: RIGHT-sided Port-A-Cath terminates at the caval to atrial junction. Heart size is normal without pericardial effusion. Scattered coronary artery calcifications. Calcified atheromatous plaque in the thoracic aorta. Venous phase assessment of  central  pulmonary vasculature is unremarkable. Mediastinum/Nodes: Thoracic inlet structures are normal. No axillary lymphadenopathy. RIGHT paratracheal nodal enlargement with similar appearance (image 20, series 2) 13 mm short axis stable compared to the prior study. Mild subcarinal nodal enlargement also unchanged as well as mild fullness of the RIGHT hilum. No contralateral hilar adenopathy.  No AP window adenopathy. Lungs/Pleura: RIGHT upper lobe mass distorting the fissure (image 55, series 4) 4.1 x 3.1 cm, within 1 mm of previous measurements. Associated fissural thickening along the minor and major fissure in the RIGHT chest is similar though there is a new area of subtle nodularity along the major fissure (image 75, series 4) approximately 7 mm and another area associated with some mild fissural thickening that extends more inferiorly than it did on the prior study on image 79 of series 4. No pleural effusion. LEFT chest is clear. Airways are patent. Musculoskeletal: No acute musculoskeletal process of the bony thorax. See below for full musculoskeletal detail. CT ABDOMEN PELVIS FINDINGS Hepatobiliary: Signs of hepatic steatosis. No focal, suspicious hepatic lesion. No pericholecystic stranding. Portal vein is patent. Pancreas: Pancreas without ductal dilation or inflammation. Spleen: Spleen normal size and contour. Adrenals/Urinary Tract: 2.3 cm RIGHT adrenal lesion unchanged. Nodularity of the LEFT adrenal is similar. Renal vascular calcifications and presumed cysts of bilateral kidneys. Urinary bladder is normal. Nephrolithiasis on the LEFT with interpolar calculus measuring 6 mm similar to the previous exam. Stomach/Bowel: Stomach and small bowel with normal appearance. No perienteric stranding. No pericolonic stranding. Normal appendix. Vascular/Lymphatic: Calcified and noncalcified plaque of the abdominal aorta with similar appearance. No adenopathy in the upper abdomen. No adenopathy in the  retroperitoneum. No pelvic lymphadenopathy. Reproductive: Prostate with heterogeneity, nonspecific finding on CT. Other: Fat containing RIGHT inguinal hernia similar to prior study as is an umbilical hernia also containing fat. Musculoskeletal: No acute musculoskeletal process. Spinal degenerative changes. IMPRESSION: 1. RIGHT upper lobe mass distorting the fissure is within 1 mm of previous measurements. 2. New areas of nodularity along the minor fissure in the RIGHT chest with generalized fissural thickening extending more inferiorly than on previous imaging suspicious given patient history for disease recurrence/metastases. Consider short interval follow-up chest CT for further assessment. Given size PET is unlikely to add additional information at this time. 3. Stable mediastinal adenopathy. 4. Stable adrenal RIGHT adenoma and LEFT adrenal nodularity. 5. No evidence of metastatic disease in the abdomen or pelvis. 6. Hepatic steatosis. 7. Aortic atherosclerosis. Aortic Atherosclerosis (ICD10-I70.0). Electronically Signed   By: Zetta Bills M.D.   On: 05/11/2020 18:00    Lab Results:  CBC    Component Value Date/Time   WBC 5.6 05/12/2020 1148   WBC 5.8 01/18/2020 1443   RBC 3.54 (L) 05/12/2020 1148   HGB 12.0 (L) 05/12/2020 1148   HCT 35.5 (L) 05/12/2020 1148   PLT 272 05/12/2020 1148   MCV 100.3 (H) 05/12/2020 1148   MCH 33.9 05/12/2020 1148   MCHC 33.8 05/12/2020 1148   RDW 16.3 (H) 05/12/2020 1148   LYMPHSABS 1.7 05/12/2020 1148   MONOABS 0.8 05/12/2020 1148   EOSABS 0.3 05/12/2020 1148   BASOSABS 0.0 05/12/2020 1148    BMET    Component Value Date/Time   NA 136 05/12/2020 1148   K 3.9 05/12/2020 1148   CL 101 05/12/2020 1148   CO2 26 05/12/2020 1148   GLUCOSE 162 (H) 05/12/2020 1148   BUN 8 05/12/2020 1148   CREATININE 0.99 05/12/2020 1148   CALCIUM 10.3 05/12/2020 1148   GFRNONAA >60  05/12/2020 1148   GFRAA >60 05/12/2020 1148    BNP No results found for:  BNP  ProBNP No results found for: PROBNP  Specialty Problems      Pulmonary Problems   Non-small cell cancer of right lung (HCC)   DOE (dyspnea on exertion)      Allergies  Allergen Reactions  . Metformin And Related Other (See Comments)    Severe muscle spasms    There is no immunization history for the selected administration types on file for this patient.  Needs pneumovax23  Needs Covid19 vaccine   Attempted to get patient scheduled for COVID-19 vaccine in office, patient declined and reports that he will try to obtain through [facility] as this is closer for him.  He is willing to receive the Pneumovax 23 today  Past Medical History:  Diagnosis Date  . Arthritis   . Depression   . DM (diabetes mellitus) ([place])    type II  . Dyslipidemia   . Dyspnea    unable to walk much - he thinks it is from   . GERD (gastroesophageal reflux disease)   . Hepatitis    Hepatitis C- treated  . History of kidney stones   . HTN (hypertension)   . Neuropathy   . nscl ca dx'd 10/2019  . OSA on CPAP   . PTSD (post-traumatic stress disorder)     Tobacco History: Social History   Tobacco Use  Smoking Status Current Every Day Smoker  . Packs/day: 0.60  . Years: 50.00  . Pack years: 30.00  . Types: Cigarettes  Smokeless Tobacco Never Used  Tobacco Comment   Pt. stated he is still trying to quit.   Ready to quit: No Counseling given: Yes Comment: Pt. stated he is still trying to quit.  Smoking assessment and cessation counseling  Patient currently smoking: 6 -7 cigarettes  I have advised the patient to quit/stop smoking as soon as possible due to high risk for multiple medical problems.  It will also be very difficult for [place] to manage patient's  respiratory symptoms and status if we continue to expose her lungs to a known irritant.  We do not advise e-cigarettes as a form of stopping smoking.  Patient is willing to quit smoking.  Patient is willing to start nicotine  replacement therapies and work to setting a quit date within the next 4 weeks.  We will also have him set up follow-up with our pharmacy team to further review.  I have advised the patient that we can assist and have options of nicotine replacement therapy, provided smoking cessation education today, provided smoking cessation counseling, and provided cessation resources.  Follow-up next office visit office visit for assessment of smoking cessation.    Smoking cessation counseling advised for: 5 min    Outpatient Encounter Medications as of 05/21/2020  Medication Sig  . albuterol (VENTOLIN HFA) 108 (90 Base) MCG/ACT inhaler Inhale 2 puffs into the lungs every 4 (four) hours as needed for wheezing or shortness of breath.  Marland Kitchen amLODipine (NORVASC) 10 MG tablet Take 10 mg by mouth daily.  Marland Kitchen aspirin EC 81 MG tablet Take 81 mg by mouth daily.  Marland Kitchen atorvastatin (LIPITOR) 10 MG tablet Take 10 mg by mouth daily.  . bisacodyl (DULCOLAX) 5 MG EC tablet Take 5 mg by mouth daily as needed for moderate constipation.  . calcium carbonate (TUMS - DOSED IN MG ELEMENTAL CALCIUM) 500 MG chewable tablet Chew 2 tablets by mouth daily as needed for  indigestion or heartburn.  . folic acid (FOLVITE) 1 MG tablet Take 1 tablet (1 mg total) by mouth daily. Pt needs to pick up today and start ASAP . He will pay out of pocket. He is waiting on the [place] to send his folic acid which will be next week.  Marland Kitchen glipiZIDE (GLUCOTROL) 5 MG tablet Take 5 mg by mouth daily before breakfast.   . lidocaine-prilocaine (EMLA) cream Apply to Port-A-Cath site 30-60-minute before treatment.  . magic mouthwash SOLN Take 5 mLs by mouth 4 (four) times daily as needed for mouth pain. Swish and swallow or spit  . Menthol ([employer] LOZENGES MT) Use as directed 1 lozenge in the mouth or throat daily as needed (throat).   . naproxen (NAPROSYN) 500 MG tablet Take 500 mg by mouth 2 (two) times daily with a meal.   . pregabalin (LYRICA) 25 MG capsule Take 1  capsule (25 mg total) by mouth daily.  . psyllium (METAMUCIL) 58.6 % powder Take 1 packet by mouth daily.  . sildenafil (VIAGRA) 100 MG tablet Take 100 mg by mouth daily as needed for erectile dysfunction.  . triamterene-hydrochlorothiazide (MAXZIDE-25) 37.5-25 MG tablet Take 1 tablet by mouth daily as needed (swelling).   Marland Kitchen umeclidinium-vilanterol (ANORO ELLIPTA) 62.5-25 MCG/INH AEPB Inhale 1 puff into the lungs daily.  . nicotine (NICODERM CQ) 21 mg/24hr patch Place 1 patch (21 mg total) onto the skin daily.  . nicotine polacrilex (COMMIT) 4 MG lozenge Take 1 lozenge (4 mg total) by mouth as needed for smoking cessation.  Marland Kitchen umeclidinium-vilanterol (ANORO ELLIPTA) 62.5-25 MCG/INH AEPB Inhale 1 puff into the lungs daily.   No facility-administered encounter medications on file as of 05/21/2020.     Review of Systems  Review of Systems  Constitutional: Negative for activity change, chills, fatigue, fever and unexpected weight change.  HENT: Negative for postnasal drip, rhinorrhea, sinus pressure, sinus pain and sore throat.   Eyes: Negative.   Respiratory: Negative for cough, shortness of breath and wheezing.   Cardiovascular: Negative for chest pain and palpitations.  Gastrointestinal: Negative for constipation, diarrhea, nausea and vomiting.  Endocrine: Negative.   Genitourinary: Negative.   Musculoskeletal: Negative.   Skin: Negative.   Neurological: Negative for dizziness and headaches.  Psychiatric/Behavioral: Negative.  Negative for dysphoric mood. The patient is not nervous/anxious.   All other systems reviewed and are negative.    Physical Exam  BP 128/82 (BP Location: Left Arm, Cuff Size: Normal)   Pulse 82   Temp 97.9 F (36.6 C) (Oral)   Ht 5\' 9"  (1.753 m)   Wt 242 lb 6.4 oz (110 kg)   SpO2 99%   BMI 35.80 kg/m   Wt Readings from Last 5 Encounters:  05/21/20 242 lb 6.4 oz (110 kg)  05/12/20 238 lb 6.4 oz (108.1 kg)  04/20/20 240 lb 4.8 oz (109 kg)  03/30/20 237  lb 3.2 oz (107.6 kg)  03/09/20 240 lb 3.2 oz (109 kg)    BMI Readings from Last 5 Encounters:  05/21/20 35.80 kg/m  05/12/20 35.21 kg/m  04/20/20 35.49 kg/m  03/30/20 35.03 kg/m  03/09/20 35.47 kg/m     Physical Exam Vitals and nursing note reviewed.  Constitutional:      General: He is not in acute distress.    Appearance: Normal appearance. He is obese.  HENT:     Head: Normocephalic and atraumatic.     Right Ear: Hearing, tympanic membrane, ear canal and external ear normal. There is no impacted cerumen.  Left Ear: Hearing, tympanic membrane, ear canal and external ear normal. There is no impacted cerumen.     Nose: Congestion and rhinorrhea present. No mucosal edema.     Right Turbinates: Not enlarged.     Left Turbinates: Not enlarged.     Mouth/Throat:     Mouth: Mucous membranes are dry.     Pharynx: Oropharynx is clear. No oropharyngeal exudate.     Comments: Mallampati 4 Eyes:     Pupils: Pupils are equal, round, and reactive to light.  Cardiovascular:     Rate and Rhythm: Normal rate and regular rhythm.     Pulses: Normal pulses.     Heart sounds: Normal heart sounds. No murmur heard.   Pulmonary:     Effort: Pulmonary effort is normal.     Breath sounds: Normal breath sounds. No decreased breath sounds, wheezing or rales.  Musculoskeletal:     Cervical back: Normal range of motion.     Right lower leg: No edema.     Left lower leg: No edema.  Lymphadenopathy:     Cervical: No cervical adenopathy.  Skin:    General: Skin is warm and dry.     Capillary Refill: Capillary refill takes less than 2 seconds.     Findings: No erythema or rash.  Neurological:     General: No focal deficit present.     Mental Status: He is alert and oriented to person, place, and time.     Motor: No weakness.     Coordination: Coordination normal.     Gait: Gait is intact. Gait normal.  Psychiatric:        Mood and Affect: Mood normal.        Behavior: Behavior  normal. Behavior is cooperative.        Thought Content: Thought content normal.        Judgment: Judgment normal.       Assessment & Plan:   Non-small cell cancer of right lung (HCC) Plan: Continue follow-up with oncology Obtain pulmonary function testing Work to stop smoking  At risk for obstructive sleep apnea Previous diagnosis of obstructive sleep apnea per patient Previously managed on CPAP therapy He is unsure the severity of his previous diagnosis Mallampati 4  Plan: Home sleep study ordered Sleep consult ordered for 4 months from now  DOE (dyspnea on exertion) Suspected COPD given smoking history No formal pulmonary function testing  Plan: Obtain pulmonary function testing Therapeutic trial of Anoro Ellipta Patient educated on Anoro Ellipta device today Follow-up in 2 to 3 months with our office with pulmonary function testing  Healthcare maintenance Plan: Patient received Pneumovax 23 today Patient encouraged to get scheduled for COVID-19 vaccines, patient is aware that he can schedule either through [facility] or at a commercial pharmacy, patient will get scheduled with [facility] as this is closer to his house Encourage patient to consider flu vaccine in the fall  Tobacco abuse Currently smoking 6 to 7 cigarettes a day Willing to quit Willing to try nicotine replacement therapies  Plan: 21 mg nicotine patch prescribed today 4 mg nicotine lozenge prescribed today Encourage patient to do reduce to quit method to decrease cigarette total daily intake down to 0 within the next 4 weeks Patient needs to set quit date Patient to set up pharmacy team appointment to review smoking cessation as well as device training    Return in about 2 months (around 07/22/2020), or if symptoms worsen or fail to improve, for Follow up  with Wyn Quaker FNP-C, Follow up with Dr. Valeta Harms, Follow up for FULL PFT - 60 min.   Lauraine Rinne, NP 05/21/2020   This appointment required 50  minutes of patient care (this includes precharting, chart review, review of results, face-to-face care, etc.).

## 2020-05-21 NOTE — Progress Notes (Signed)
PCCM: Thanks for seeing him Kevin Nash, DO [facility] 05/21/2020 6:35 PM

## 2020-05-21 NOTE — Progress Notes (Signed)
foll

## 2020-05-21 NOTE — Assessment & Plan Note (Signed)
Plan: Patient received Pneumovax 23 today Patient encouraged to get scheduled for COVID-19 vaccines, patient is aware that he can schedule either through [facility] or at a commercial pharmacy, patient will get scheduled with [facility] as this is closer to his house Encourage patient to consider flu vaccine in the fall

## 2020-05-21 NOTE — Assessment & Plan Note (Signed)
Currently smoking 6 to 7 cigarettes a day Willing to quit Willing to try nicotine replacement therapies  Plan: 21 mg nicotine patch prescribed today 4 mg nicotine lozenge prescribed today Encourage patient to do reduce to quit method to decrease cigarette total daily intake down to 0 within the next 4 weeks Patient needs to set quit date Patient to set up pharmacy team appointment to review smoking cessation as well as device training

## 2020-05-21 NOTE — Patient Instructions (Addendum)
You were seen today by Lauraine Rinne, NP  for:   1. Non-small cell cancer of right lung [facility])  Keep follow-up with oncology  2. DOE (dyspnea on exertion)  We suspect that you may have COPD given your long history of smoking  You need to be scheduled for a pulmonary function test  Trial Anoro Ellipta  >>> Take 1 puff daily in the morning right when you wake up >>>Rinse your mouth out after use >>>This is a daily maintenance inhaler, NOT a rescue inhaler >>>Contact our office if you are having difficulties affording or obtaining this medication >>>It is important for you to be able to take this daily and not miss any doses   Call us in 1 to 2 weeks and let us know how you are doing with using this inhaler  3. Tobacco abuse  - nicotine (NICODERM CQ) 21 mg/24hr patch; Place 1 patch (21 mg total) onto the skin daily.  Dispense: 28 patch; Refill: 3 - nicotine polacrilex (COMMIT) 4 MG lozenge; Take 1 lozenge (4 mg total) by mouth as needed for smoking cessation.  Dispense: 108 tablet; Refill: 3  We recommend that you stop smoking.  >>>You need to set a quit date >>>If you have friends or family who smoke, let them know you are trying to quit and not to smoke around you or in your living environment  Smoking Cessation Resources:  1 800 QUIT NOW  >>> Patient to call this resource and utilize it to help support her quit smoking >>> Keep up your hard work with stopping smoking  You can also contact the [facility] >>>For smoking cessation classes call [phone]  We do not recommend using e-cigarettes as a form of stopping smoking  You can sign up for smoking cessation support texts and information:  >>>https://smokefree.gov/smokefreetxt    Nicotine patches: >>>Make sure you rotate sites that you do not get skin irritation, Apply 1 patch each morning to a non-hairy skin site  If you are smoking greater than 10 cigarettes/day and weigh over 45 kg start with the  nicotine patch of 21 mg a day for 6 weeks, then 14 mg a day for 2 weeks, then finished with 7 mg a day for 2 weeks, then stop  If you are smoking less than 10 cigarettes a day or weight less than 45 kg start with medium dose pack of 14 mg a day for 6 weeks, followed by 7 mg a day for 2 weeks   >>>If insomnia occurs you are having trouble sleeping you can take the patch off at night, and place a new one on in the morning >>>If the patch is removed at night and you have morning cravings start short acting nicotine replacement therapy such as gum or lozenges   Nicotine lozenge: Lozenges are commonly uses short acting NRT product  >>>Smokers who smoke within 30 minutes of awakening should use 4 mg dose >>>Smokers who wait more than 30 minutes after awakening to smoke should use 2 mg dose  Can use up to 1 lozenge every 1-2 hours for 6 weeks >>>Total amount of lozenges that can be used per day as 20 >>>Gradually reduce number of lozenges used per day after 2 weeks of use  Place lozenge in mouth and allowed to dissolve for 30 minutes loss and does not need to be chewed  Lozenges have advantages to be able to be used in people with TMG, poor dentition, dentures   4. At  risk for obstructive sleep apnea  - Home sleep test; Future  Previous history of obstructive sleep apnea  We will get you set up with a sleep doctor as a consult in 4 months  5. Healthcare maintenance  Pneumovax 23 vaccine today  We recommend that you get scheduled for the COVID-19 vaccine Okay to be scheduled at a day after 05/28/2020  Please try to obtain this through either a commercial pharmacy or at [place] our office if you are having any difficulties obtaining the COVID-19 vaccine and we can assist you   We recommend today:  Orders Placed This Encounter  Procedures  . Home sleep test    Standing Status:   Future    Standing Expiration Date:   05/21/2021    Order Specific Question:   Where  should this test be performed:    Answer:   [facility] - Pulmonary   Orders Placed This Encounter  Procedures  . Home sleep test   Meds ordered this encounter  Medications  . nicotine (NICODERM CQ) 21 mg/24hr patch    Sig: Place 1 patch (21 mg total) onto the skin daily.    Dispense:  28 patch    Refill:  3  . nicotine polacrilex (COMMIT) 4 MG lozenge    Sig: Take 1 lozenge (4 mg total) by mouth as needed for smoking cessation.    Dispense:  108 tablet    Refill:  3    Follow Up:    Return in about 2 months (around 07/22/2020), or if symptoms worsen or fail to improve, for Follow up with Wyn Quaker FNP-C, Follow up with Dr. Valeta Harms, Follow up for FULL PFT - 60 min.  Please present to our office in 2-4 weeks for an appointment with the clinical pharmacy team for:  . Smoking cessation . Medication Management  . Medication reconciliation  . Medication Access  . Inhaler teaching    4 month SLEEP CONSULT - 19min slot               Please do your part to reduce the spread of COVID-19:      Reduce your risk of any infection  and COVID19 by using the similar precautions used for avoiding the common cold or flu:  Marland Kitchen Wash your hands often with soap and warm water for at least 20 seconds.  If soap and water are not readily available, use an alcohol-based hand sanitizer with at least 60% alcohol.  . If coughing or sneezing, cover your mouth and nose by coughing or sneezing into the elbow areas of your shirt or coat, into a tissue or into your sleeve (not your hands). Langley Gauss A MASK when in public  . Avoid shaking hands with others and consider head nods or verbal greetings only. . Avoid touching your eyes, nose, or mouth with unwashed hands.  . Avoid close contact with people who are sick. . Avoid places or events with large numbers of people in one location, like concerts or sporting events. . If you have some symptoms but not all symptoms, continue to monitor at home and seek  medical attention if your symptoms worsen. . If you are having a medical emergency, call 911.   [place] / e-Visit: [email or web address]         [facility]: [facility]: [phone]  [facility]: [phone]     It is flu season:   >>> Best ways to protect herself from the flu: Receive the yearly flu vaccine, practice good hand hygiene washing with soap and also using hand sanitizer when available, eat a nutritious meals, get adequate rest, hydrate appropriately   Please contact the office if your symptoms worsen or you have concerns that you are not improving.   Thank you for choosing [facility] for your healthcare, and for allowing [place] to partner with you on your healthcare journey. I am thankful to be able to provide care to you today.   Wyn Quaker FNP-C   Pneumococcal Vaccine, Polyvalent solution for injection What is this medicine? PNEUMOCOCCAL VACCINE, POLYVALENT (NEU mo KOK al vak SEEN, pol ee VEY luhnt) is a vaccine to prevent pneumococcus bacteria infection. These bacteria are a major cause of ear infections, Strep throat infections, and serious pneumonia, meningitis, or blood infections worldwide. These vaccines help the body to produce antibodies (protective substances) that help your body defend against these bacteria. This vaccine is recommended for people 28 years of age and older with health problems. It is also recommended for all adults over 7 years old. This vaccine will not treat an infection. This medicine may be used for other purposes; ask your health care provider or pharmacist if you have questions. COMMON BRAND NAME(S): Pneumovax 23 What should I tell my health care provider before I take this medicine? They need to know if you have any of these conditions:  bleeding  problems  bone marrow or organ transplant  cancer, Hodgkin's disease  fever  infection  immune system problems  low platelet count in the blood  seizures  an unusual or allergic reaction to pneumococcal vaccine, diphtheria toxoid, other vaccines, latex, other medicines, foods, dyes, or preservatives  pregnant or trying to get pregnant  breast-feeding How should I use this medicine? This vaccine is for injection into a muscle or under the skin. It is given by a health care professional. A copy of Vaccine Information Statements will be given before each vaccination. Read this sheet carefully each time. The sheet may change frequently. Talk to your pediatrician regarding the use of this medicine in children. While this drug may be prescribed for children as young as 24 years of age for selected conditions, precautions do apply. Overdosage: If you think you have taken too much of this medicine contact a poison control center or emergency room at once. NOTE: This medicine is only for you. Do not share this medicine with others. What if I miss a dose? It is important not to miss your dose. Call your doctor or health care professional if you are unable to keep an appointment. What may interact with this medicine?  medicines for cancer chemotherapy  medicines that suppress your immune function  medicines that treat or prevent blood clots like warfarin, enoxaparin, and dalteparin  steroid medicines like prednisone or cortisone This list may not describe all possible interactions. Give your health care provider a list of all the medicines, herbs, non-prescription drugs, or dietary supplements you use. Also tell them if you smoke, drink alcohol, or use illegal drugs. Some items may interact with your medicine. What should I watch for while using this medicine? Mild fever and pain should go away in 3 days or less. Report any unusual symptoms to your doctor or health care  professional. What side effects may I notice from receiving this medicine? Side effects  that you should report to your doctor or health care professional as soon as possible:  allergic reactions like skin rash, itching or hives, swelling of the face, lips, or tongue  breathing problems  confused  fever over 102 degrees F  pain, tingling, numbness in the hands or feet  seizures  unusual bleeding or bruising  unusual muscle weakness Side effects that usually do not require medical attention (report to your doctor or health care professional if they continue or are bothersome):  aches and pains  diarrhea  fever of 102 degrees F or less  headache  irritable  loss of appetite  pain, tender at site where injected  trouble sleeping This list may not describe all possible side effects. Call your doctor for medical advice about side effects. You may report side effects to FDA at 1-800-FDA-1088. Where should I keep my medicine? This does not apply. This vaccine is given in a clinic, pharmacy, doctor's office, or other health care setting and will not be stored at home. NOTE: This sheet is a summary. It may not cover all possible information. If you have questions about this medicine, talk to your doctor, pharmacist, or health care provider.  2020 Elsevier/Gold Standard (2008-05-30 14:32:37)

## 2020-05-23 ENCOUNTER — Encounter (HOSPITAL_COMMUNITY): Payer: Self-pay

## 2020-05-23 ENCOUNTER — Emergency Department (HOSPITAL_COMMUNITY)
Admission: EM | Admit: 2020-05-23 | Discharge: 2020-05-23 | Disposition: A | Discharge status: Home / Self Care | Payer: No Typology Code available for payment source | Attending: Emergency Medicine | Admitting: Emergency Medicine

## 2020-05-23 ENCOUNTER — Other Ambulatory Visit: Payer: Self-pay

## 2020-05-23 ENCOUNTER — Emergency Department (HOSPITAL_BASED_OUTPATIENT_CLINIC_OR_DEPARTMENT_OTHER): Payer: No Typology Code available for payment source

## 2020-05-23 DIAGNOSIS — M7989 Other specified soft tissue disorders: Secondary | ICD-10-CM | POA: Diagnosis not present

## 2020-05-23 DIAGNOSIS — E1140 Type 2 diabetes mellitus with diabetic neuropathy, unspecified: Secondary | ICD-10-CM | POA: Insufficient documentation

## 2020-05-23 DIAGNOSIS — I10 Essential (primary) hypertension: Secondary | ICD-10-CM | POA: Insufficient documentation

## 2020-05-23 DIAGNOSIS — Z7984 Long term (current) use of oral hypoglycemic drugs: Secondary | ICD-10-CM | POA: Diagnosis not present

## 2020-05-23 DIAGNOSIS — L03116 Cellulitis of left lower limb: Secondary | ICD-10-CM | POA: Insufficient documentation

## 2020-05-23 DIAGNOSIS — Z79899 Other long term (current) drug therapy: Secondary | ICD-10-CM | POA: Diagnosis not present

## 2020-05-23 DIAGNOSIS — R52 Pain, unspecified: Secondary | ICD-10-CM

## 2020-05-23 DIAGNOSIS — F17210 Nicotine dependence, cigarettes, uncomplicated: Secondary | ICD-10-CM | POA: Insufficient documentation

## 2020-05-23 DIAGNOSIS — R2242 Localized swelling, mass and lump, left lower limb: Secondary | ICD-10-CM | POA: Diagnosis present

## 2020-05-23 LAB — CBG MONITORING, ED: Glucose-Capillary: 148 mg/dL — ABNORMAL HIGH (ref 70–99)

## 2020-05-23 MED ORDER — CLINDAMYCIN HCL 150 MG PO CAPS
300.0000 mg | ORAL_CAPSULE | Freq: Three times a day (TID) | ORAL | 0 refills | Status: AC
Start: 2020-05-23 — End: 2020-05-30

## 2020-05-23 NOTE — ED Triage Notes (Signed)
He reports increase in his chronic bilat. Lower extremity edema for about a week now. This persists, in spite of his HCTZ which he tells me he has been taking.

## 2020-05-23 NOTE — ED Provider Notes (Signed)
[facility] DEPT Provider Note   CSN: [id number] Arrival date & time: 05/23/20  1604     History Chief Complaint  Patient presents with  . Leg Swelling    Kevin Lambert. is a 64 y.o. male.  HPI     Had problems with swelling in left leg for 20 years but has never had pain Takes hctz which had been helping but not over the last month Over last month has had swelling, skin cracking, darkening of skin that has been worsening and not helped by the hctz. Now both legs, left leg more than right.  Now also having pain, throbbing pain, stabbing pain has been in both in the past month, now is in left leg.  No fevers, no injuries, no chest pain. Has mild dyspnea which has not changed. No worsening dyspnea when laying flat--doesn't lay down flat.     Past Medical History:  Diagnosis Date  . Arthritis   . Depression   . DM (diabetes mellitus) ([place])    type II  . Dyslipidemia   . Dyspnea    unable to walk much - he thinks it is from   . GERD (gastroesophageal reflux disease)   . Hepatitis    Hepatitis C- treated  . History of kidney stones   . HTN (hypertension)   . Neuropathy   . nscl ca dx'd 10/2019  . OSA on CPAP   . PTSD (post-traumatic stress disorder)     Patient Active Problem List   Diagnosis Date Noted  . Healthcare maintenance 05/21/2020  . DOE (dyspnea on exertion) 05/21/2020  . At risk for obstructive sleep apnea 05/21/2020  . Encounter for antineoplastic immunotherapy 04/20/2020  . Port-A-Cath in place 01/15/2020  . Encounter for antineoplastic chemotherapy 12/16/2019  . Goals of care, counseling/discussion 12/16/2019  . Mediastinal adenopathy 12/10/2019  . Non-small cell cancer of right lung ([place]) 11/29/2019  . Tobacco abuse 11/29/2019  . HTN (hypertension) 11/29/2019  . DM (diabetes mellitus) ([place]) 11/29/2019    Past Surgical History:  Procedure Laterality Date  . BIOPSY OF MEDIASTINAL MASS  12/10/2019   Procedure:  BIOPSY OF MEDIASTINAL MASS;  Surgeon: Garner Nash, DO;  Location: [facility];  Service: Pulmonary;;  right upper lobe  . BRONCHIAL NEEDLE ASPIRATION BIOPSY  12/10/2019   Procedure: BRONCHIAL NEEDLE ASPIRATION BIOPSIES;  Surgeon: Garner Nash, DO;  Location: [facility];  Service: Pulmonary;;  . COLONOSCOPY    . IR IMAGING GUIDED PORT INSERTION  12/31/2019  . kidney stone    . URETERAL STENT PLACEMENT    . Ureteral Stent Removed    . VIDEO BRONCHOSCOPY WITH ENDOBRONCHIAL ULTRASOUND N/A 12/10/2019   Procedure: VIDEO BRONCHOSCOPY WITH ENDOBRONCHIAL ULTRASOUND;  Surgeon: Garner Nash, DO;  Location: [facility];  Service: Pulmonary;  Laterality: N/A;       Family History  Problem Relation Age of Onset  . Liver disease Mother   . Diabetes Father     Social History   Tobacco Use  . Smoking status: Current Every Day Smoker    Packs/day: 0.60    Years: 50.00    Pack years: 30.00    Types: Cigarettes  . Smokeless tobacco: Never Used  . Tobacco comment: Pt. stated he is still trying to quit.  Vaping Use  . Vaping Use: Never used  Substance Use Topics  . Alcohol use: Yes    Alcohol/week: 6.0 standard drinks    Types: 6 Cans of beer per week  .  Drug use: Not Currently    Home Medications Prior to Admission medications   Medication Sig Start Date End Date Taking? Authorizing Provider  albuterol (VENTOLIN HFA) 108 (90 Base) MCG/ACT inhaler Inhale 2 puffs into the lungs every 4 (four) hours as needed for wheezing or shortness of breath. 01/02/20  Yes Icard, Bradley L, DO  amLODipine (NORVASC) 10 MG tablet Take 10 mg by mouth daily.   Yes [provider]  Ascorbic Acid (VITAMIN C) 1000 MG tablet Take 1,000 mg by mouth daily.   Yes [provider]  aspirin EC 81 MG tablet Take 81 mg by mouth daily.   Yes [provider]  atorvastatin (LIPITOR) 10 MG tablet Take 10 mg by mouth daily.   Yes [provider]  bisacodyl (DULCOLAX) 5 MG EC tablet  Take 5 mg by mouth daily as needed for moderate constipation.   Yes [provider]  cholecalciferol (VITAMIN D3) 25 MCG (1000 UNIT) tablet Take 1,000 Units by mouth daily.   Yes [provider]  Cyanocobalamin (VITAMIN B 12 PO) Take 1 tablet by mouth daily.   Yes [provider]  folic acid (FOLVITE) 1 MG tablet Take 1 tablet (1 mg total) by mouth daily. Pt needs to pick up today and start ASAP . He will pay out of pocket. He is waiting on the [place] to send his folic acid which will be next week. Patient taking differently: Take 1 mg by mouth daily.  12/27/19  Yes Heilingoetter, Cassandra L, PA-C  glipiZIDE (GLUCOTROL) 5 MG tablet Take 5 mg by mouth daily before breakfast.    Yes [provider]  pregabalin (LYRICA) 25 MG capsule Take 1 capsule (25 mg total) by mouth daily. 01/28/20  Yes Tanner, Lyndon Code., PA-C  Propylene Glycol (SYSTANE BALANCE OP) Place 1 drop into both eyes daily.   Yes [provider]  triamterene-hydrochlorothiazide (MAXZIDE-25) 37.5-25 MG tablet Take 1 tablet by mouth daily.    Yes [provider]  zinc gluconate 50 MG tablet Take 50 mg by mouth daily.   Yes [provider]  clindamycin (CLEOCIN) 150 MG capsule Take 2 capsules (300 mg total) by mouth 3 (three) times daily for 7 days. 05/23/20 05/30/20  Gareth Morgan, MD  lidocaine-prilocaine (EMLA) cream Apply to Port-A-Cath site 30-60-minute before treatment. Patient not taking: Reported on 05/23/2020 12/25/19   Curt Bears, MD  magic mouthwash SOLN Take 5 mLs by mouth 4 (four) times daily as needed for mouth pain. Swish and swallow or spit Patient not taking: Reported on 05/23/2020 01/28/20   Harle Stanford., PA-C  naproxen (NAPROSYN) 500 MG tablet Take 500 mg by mouth 2 (two) times daily with a meal.  Patient not taking: Reported on 05/23/2020    [provider]  nicotine (NICODERM CQ) 21 mg/24hr patch Place 1 patch (21 mg total) onto the skin daily. Patient  not taking: Reported on 05/23/2020 05/21/20   Lauraine Rinne, NP  nicotine polacrilex (COMMIT) 4 MG lozenge Take 1 lozenge (4 mg total) by mouth as needed for smoking cessation. Patient not taking: Reported on 05/23/2020 05/21/20   Lauraine Rinne, NP  umeclidinium-vilanterol (ANORO ELLIPTA) 62.5-25 MCG/INH AEPB Inhale 1 puff into the lungs daily. Patient not taking: Reported on 05/23/2020 01/02/20   Icard, Octavio Graves, DO  umeclidinium-vilanterol (ANORO ELLIPTA) 62.5-25 MCG/INH AEPB Inhale 1 puff into the lungs daily. Patient not taking: Reported on 05/23/2020 05/21/20   Lauraine Rinne, NP    Allergies  Metformin and related  Review of Systems   Review of Systems  Constitutional: Negative for fever.  HENT: Negative for sore throat.   Eyes: Negative for visual disturbance.  Respiratory: Negative for cough (no new cough) and shortness of breath.   Cardiovascular: Positive for leg swelling. Negative for chest pain.  Gastrointestinal: Negative for abdominal pain, nausea and vomiting.  Genitourinary: Negative for difficulty urinating.  Musculoskeletal: Negative for back pain and neck stiffness.  Skin: Negative for rash.  Neurological: Negative for syncope and headaches.    Physical Exam Updated Vital Signs BP (!) 169/64   Pulse 80   Temp 97.9 F (36.6 C)   Resp 18   SpO2 97%   Physical Exam Vitals and nursing note reviewed.  Constitutional:      General: He is not in acute distress.    Appearance: He is well-developed. He is not diaphoretic.  HENT:     Head: Normocephalic and atraumatic.  Eyes:     Conjunctiva/sclera: Conjunctivae normal.  Cardiovascular:     Rate and Rhythm: Normal rate and regular rhythm.     Heart sounds: Normal heart sounds. No murmur heard.  No friction rub. No gallop.      Comments: Palpable DP/PT pulses bilaterally confirmed with doppler signal Pulmonary:     Effort: Pulmonary effort is normal. No respiratory distress.     Breath sounds: Normal breath  sounds. No wheezing or rales.  Abdominal:     General: There is no distension.     Palpations: Abdomen is soft.     Tenderness: There is no abdominal tenderness. There is no guarding.  Musculoskeletal:        General: Tenderness (left calf wamth and tenderness) present.     Cervical back: Normal range of motion.  Skin:    General: Skin is warm and dry.  Neurological:     Mental Status: He is alert and oriented to person, place, and time.       ED Results / Procedures / Treatments   Labs (all labs ordered are listed, but only abnormal results are displayed) Labs Reviewed  CBG MONITORING, ED - Abnormal; Notable for the following components:      Result Value   Glucose-Capillary 148 (*)    All other components within normal limits    EKG None  Radiology VAS [place] LOWER EXTREMITY VENOUS (DVT) ([facility] and [facility] 7a-7p)  Result Date: 05/24/2020  Lower Venous DVTStudy Indications: Pain, and Swelling X 74month.  Risk Factors: Cancer Lung, stage IV. Limitations: Body habitus and edema, pain with compression of left calf. Comparison Study: Prior study from 01/18/20 is available for comparison Performing Technologist: Sharion Dove RVS  Examination Guidelines: A complete evaluation includes B-mode imaging, spectral Doppler, color Doppler, and power Doppler as needed of all accessible portions of each vessel. Bilateral testing is considered an integral part of a complete examination. Limited examinations for reoccurring indications may be performed as noted. The reflux portion of the exam is performed with the patient in reverse Trendelenburg.  +---------+---------------+---------+-----------+----------+--------------+ RIGHT    CompressibilityPhasicitySpontaneityPropertiesThrombus Aging +---------+---------------+---------+-----------+----------+--------------+ CFV      Full           Yes      Yes                                  +---------+---------------+---------+-----------+----------+--------------+ SFJ      Full                                                        +---------+---------------+---------+-----------+----------+--------------+  FV Prox  Full                                                        +---------+---------------+---------+-----------+----------+--------------+ FV Mid   Full                                                        +---------+---------------+---------+-----------+----------+--------------+ FV DistalFull                                                        +---------+---------------+---------+-----------+----------+--------------+ PFV      Full                                                        +---------+---------------+---------+-----------+----------+--------------+ POP      Full           Yes      Yes                                 +---------+---------------+---------+-----------+----------+--------------+ PTV      Full                                                        +---------+---------------+---------+-----------+----------+--------------+ PERO     Full                                                        +---------+---------------+---------+-----------+----------+--------------+   +---------+---------------+---------+-----------+----------+-------------------+ LEFT     CompressibilityPhasicitySpontaneityPropertiesThrombus Aging      +---------+---------------+---------+-----------+----------+-------------------+ CFV      Full           Yes      Yes                                      +---------+---------------+---------+-----------+----------+-------------------+ SFJ      Full                                                             +---------+---------------+---------+-----------+----------+-------------------+ FV Prox  Full                                                              +---------+---------------+---------+-----------+----------+-------------------+  FV Mid   Full                                                             +---------+---------------+---------+-----------+----------+-------------------+ FV DistalFull                                                             +---------+---------------+---------+-----------+----------+-------------------+ PFV      Full                                                             +---------+---------------+---------+-----------+----------+-------------------+ POP      Full           Yes      Yes                                      +---------+---------------+---------+-----------+----------+-------------------+ PTV                                                   patent by color     +---------+---------------+---------+-----------+----------+-------------------+ PERO                                                  patent by color and                                                       Doppler             +---------+---------------+---------+-----------+----------+-------------------+     Summary: BILATERAL: - No evidence of deep vein thrombosis seen in the lower extremities, bilaterally. -No evidence of popliteal cyst, bilaterally. RIGHT: - Findings appear essentially unchanged compared to previous examination.  LEFT: - Findings appear essentially unchanged compared to previous examination.  *See table(s) above for measurements and observations. Electronically signed by Ruta Hinds MD on 05/24/2020 at 1:02:29 PM.    Final     Procedures Procedures (including critical care time)  Medications Ordered in ED Medications - No data to display  ED Course  I have reviewed the triage vital signs and the nursing notes.  Pertinent labs & imaging results that were available during my care of the patient were reviewed by me and considered in my medical decision making (see chart for  details).    MDM Rules/Calculators/A&P  64 year old male with history of non-small cell lung cancer stage IV, DM, presents with concern for leg swelling and pain. Reports some bilateral symptoms, however left significantly worse. Do not see signs of significant edema or signs on exam or hx to indicate need for emergent evaluation for CHF.  DVT study negative for DVT.  Pulses palpable and confirmed with doppler and doubt acute arterial thrombus.  Does have skin color changes, warmth, tenderness on left and will treat for cellulitis. Recommend close outpatient follow up and discussed reasons to return. Patient discharged in stable condition with understanding of reasons to return.    Final Clinical Impression(s) / ED Diagnoses Final diagnoses:  Leg swelling  Cellulitis of left lower extremity    Rx / DC Orders ED Discharge Orders         Ordered    clindamycin (CLEOCIN) 150 MG capsule  3 times daily     Discontinue  Reprint     05/23/20 2013           Gareth Morgan, MD 05/25/20 [phone]

## 2020-05-23 NOTE — Progress Notes (Signed)
VASCULAR LAB PRELIMINARY  PRELIMINARY  PRELIMINARY  PRELIMINARY  Bilateral lower extremity venous duplex completed.    Preliminary report:  See CV proc for preliminary results.  Messaged Dr. Billy Fischer with results. Gave verbal report to EDPs in physicians' workroom.  Keaton Stirewalt, RVT 05/23/2020, 8:04 PM

## 2020-05-23 NOTE — ED Notes (Signed)
Vascular at bedside

## 2020-05-29 ENCOUNTER — Other Ambulatory Visit: Payer: Self-pay | Admitting: *Deleted

## 2020-05-29 ENCOUNTER — Telehealth: Payer: Self-pay | Admitting: *Deleted

## 2020-05-29 DIAGNOSIS — C3491 Malignant neoplasm of unspecified part of right bronchus or lung: Secondary | ICD-10-CM

## 2020-05-29 NOTE — Telephone Encounter (Signed)
TOC CM received call from pt stating [facility] did not have his Rx. Contacted pharmacy and it was not received through [facility]ribe. They close at 430 pm. Contacted pt and he requested it be called to [facility] on [address]. Sent a goodrx coupon to pt for $14 for meds. He will pick up at local pharmacy. [place], [place] ED TOC CM [phone]

## 2020-05-29 NOTE — Progress Notes (Signed)
[facility] OFFICE PROGRESS NOTE  [name], [place]  [place] [address]  DIAGNOSIS: Stage IV (T2b, N3, M1a) non-small cell lung cancer, adenocarcinoma presented with large right upper lobe lung mass in addition to mediastinal and right supraclavicular lymphadenopathy as well as bilateral pulmonary nodules diagnosed in February 2021.  MOLECULAR STUDY by [employer]:  KRASG12C,4.3%,Binimetinib  [id number],0.9%,Niraparib, Olaparib, Rucaparib,Talazoparib, Tazemetostat  [id number],6.2%,ZHYQ  PRIOR THERAPY: None  CURRENT THERAPY: Systemic chemotherapy with carboplatin for AUC of 5, Alimta 500 mg/M2 and Keytruda 200 mg IV every 3 weeks. First dose December 31, 2019.Status post 7 cycles. Starting from cycle #5, he will be on maintenance Alimta and Keytruda.   INTERVAL HISTORY: Fernado Brigante. 64 y.o. male returns to the clinic for a follow up visit. The patient is feeling well today without any concerning complaints except he was seen in the ER on 7/17 for the chief complaint of left lower extremity swelling, pain, and discoloration. He had a doppler ultrasound performed which was negative for DVT. He was treated for suspected cellulitis. He was given a prescription for clindamycin which he just started two days ago on 7/24. He is still having darkening of the skin and dry skin. The pain has improved as well as the swelling. The patient denies any fevers or chills. Of note, the patient is prescribed "fluid pills" for his occasional leg swelling.   The patient continues to tolerate treatment otherwise well, except he does have associated tearing of his eyes which may be secondary to his alimta. He states this is embarrassing. He has not used anything for this at this time due to not knowing how to manage it. Denies any night sweats or weight loss. He states he has a good appetite. Denies any chest pain or hemoptysis but reports his baseline shortness  of breath with exertion. He reports that he coughs "very little". He recently had a follow up visit with pulmonology who discussed smoking cessation, health maintenance with his pneumonia vaccine, and inhaler management. He has a formal smoking cessation cession on 7/29. Denies any nausea or vomiting. He has periodic constipation for which he eats fruits which helps. Denies any headache or visual changes.  The patient is here today for evaluation prior to starting cycle # 8.   MEDICAL HISTORY: Past Medical History:  Diagnosis Date  . Arthritis   . Depression   . DM (diabetes mellitus) ([place])    type II  . Dyslipidemia   . Dyspnea    unable to walk much - he thinks it is from   . GERD (gastroesophageal reflux disease)   . Hepatitis    Hepatitis C- treated  . History of kidney stones   . HTN (hypertension)   . Neuropathy   . nscl ca dx'd 10/2019  . OSA on CPAP   . PTSD (post-traumatic stress disorder)     ALLERGIES:  is allergic to metformin and related.  MEDICATIONS:  Current Outpatient Medications  Medication Sig Dispense Refill  . albuterol (VENTOLIN HFA) 108 (90 Base) MCG/ACT inhaler Inhale 2 puffs into the lungs every 4 (four) hours as needed for wheezing or shortness of breath. 18 g 3  . amLODipine (NORVASC) 10 MG tablet Take 10 mg by mouth daily.    . Ascorbic Acid (VITAMIN C) 1000 MG tablet Take 1,000 mg by mouth daily.    Marland Kitchen aspirin EC 81 MG tablet Take 81 mg by mouth daily.    Marland Kitchen atorvastatin (LIPITOR) 10 MG  tablet Take 10 mg by mouth daily.    . bisacodyl (DULCOLAX) 5 MG EC tablet Take 5 mg by mouth daily as needed for moderate constipation.    . cholecalciferol (VITAMIN D3) 25 MCG (1000 UNIT) tablet Take 1,000 Units by mouth daily.    . Cyanocobalamin (VITAMIN B 12 PO) Take 1 tablet by mouth daily.    . folic acid (FOLVITE) 1 MG tablet Take 1 tablet (1 mg total) by mouth daily. Pt needs to pick up today and start ASAP . He will pay out of pocket. He is waiting on the [place] to  send his folic acid which will be next week. (Patient taking differently: Take 1 mg by mouth daily. ) 7 tablet 0  . glipiZIDE (GLUCOTROL) 5 MG tablet Take 5 mg by mouth daily before breakfast.     . lidocaine-prilocaine (EMLA) cream Apply to Port-A-Cath site 30-60-minute before treatment. 30 g 0  . magic mouthwash SOLN Take 5 mLs by mouth 4 (four) times daily as needed for mouth pain. Swish and swallow or spit 240 mL 2  . naproxen (NAPROSYN) 500 MG tablet Take 500 mg by mouth 2 (two) times daily with a meal.     . nicotine (NICODERM CQ) 21 mg/24hr patch Place 1 patch (21 mg total) onto the skin daily. 28 patch 3  . nicotine polacrilex (COMMIT) 4 MG lozenge Take 1 lozenge (4 mg total) by mouth as needed for smoking cessation. 108 tablet 3  . pregabalin (LYRICA) 25 MG capsule Take 1 capsule (25 mg total) by mouth daily. 30 capsule 1  . Propylene Glycol (SYSTANE BALANCE OP) Place 1 drop into both eyes daily.    Marland Kitchen triamterene-hydrochlorothiazide (MAXZIDE-25) 37.5-25 MG tablet Take 1 tablet by mouth daily.     Marland Kitchen umeclidinium-vilanterol (ANORO ELLIPTA) 62.5-25 MCG/INH AEPB Inhale 1 puff into the lungs daily. 60 each 11  . umeclidinium-vilanterol (ANORO ELLIPTA) 62.5-25 MCG/INH AEPB Inhale 1 puff into the lungs daily. 14 each 0  . zinc gluconate 50 MG tablet Take 50 mg by mouth daily.     No current facility-administered medications for this visit.   Facility-Administered Medications Ordered in Other Visits  Medication Dose Route Frequency Provider Last Rate Last Admin  . sodium chloride flush (NS) 0.9 % injection 10 mL  10 mL Intracatheter PRN Curt Bears, MD   10 mL at 06/01/20 1411    SURGICAL HISTORY:  Past Surgical History:  Procedure Laterality Date  . BIOPSY OF MEDIASTINAL MASS  12/10/2019   Procedure: BIOPSY OF MEDIASTINAL MASS;  Surgeon: Garner Nash, DO;  Location: [facility];  Service: Pulmonary;;  right upper lobe  . BRONCHIAL NEEDLE ASPIRATION BIOPSY  12/10/2019   Procedure:  BRONCHIAL NEEDLE ASPIRATION BIOPSIES;  Surgeon: Garner Nash, DO;  Location: [facility];  Service: Pulmonary;;  . COLONOSCOPY    . IR IMAGING GUIDED PORT INSERTION  12/31/2019  . kidney stone    . URETERAL STENT PLACEMENT    . Ureteral Stent Removed    . VIDEO BRONCHOSCOPY WITH ENDOBRONCHIAL ULTRASOUND N/A 12/10/2019   Procedure: VIDEO BRONCHOSCOPY WITH ENDOBRONCHIAL ULTRASOUND;  Surgeon: Garner Nash, DO;  Location: [facility];  Service: Pulmonary;  Laterality: N/A;    REVIEW OF SYSTEMS:   Review of Systems  Constitutional: Positive for fatigue. Negative for appetite change, chills, fever and unexpected weight change.  HENT: Negative for mouth sores, nosebleeds, sore throat and trouble swallowing.   Eyes: Negative for eye problems and icterus.  Respiratory: Positive for  baseline shortness of breath with exertion. Negative for cough, hemoptysis, and wheezing.   Cardiovascular: Positive for mild leg swelling. Negative for chest pain. Gastrointestinal: Negative for abdominal pain, constipation, diarrhea, nausea and vomiting.  Genitourinary: Negative for bladder incontinence, difficulty urinating, dysuria, frequency and hematuria.   Musculoskeletal: Negative for back pain, gait problem, neck pain and neck stiffness.  Skin: Positive for darkening of the skin in the left lower extremity with associated dry skin.  Neurological: Negative for dizziness, extremity weakness, gait problem, headaches, light-headedness and seizures.  Hematological: Negative for adenopathy. Does not bruise/bleed easily.  Psychiatric/Behavioral: Negative for confusion, depression and sleep disturbance. The patient is not nervous/anxious.     PHYSICAL EXAMINATION:  Blood pressure (!) 132/60, pulse 79, temperature (!) 97.5 F (36.4 C), temperature source Temporal, resp. rate 20, weight (!) 243 lb 1.6 oz (110.3 kg), SpO2 97 %.  ECOG PERFORMANCE STATUS: 1 - Symptomatic but completely ambulatory  Physical Exam   Constitutional: Oriented to person, place, and time and well-developed, well-nourished, and in no distress.  HENT:  Head: Normocephalic and atraumatic.  Mouth/Throat: Oropharynx is clear and moist. No oropharyngeal exudate.  Eyes: Positive for tearing eyes. Conjunctivae are normal. Right eye exhibits no discharge. Left eye exhibits no discharge. No scleral icterus.  Neck: Normal range of motion. Neck supple.  Cardiovascular: Normal rate, regular rhythm, normal heart sounds and intact distal pulses.   Pulmonary/Chest: Effort normal and breath sounds normal. No respiratory distress. No wheezes. No rales.  Abdominal: Soft. Bowel sounds are normal. Exhibits no distension and no mass. There is no tenderness.  Musculoskeletal: Normal range of motion. Exhibits no edema.  Lymphadenopathy:    No cervical adenopathy.  Neurological: Alert and oriented to person, place, and time. Exhibits normal muscle tone. Gait normal. Coordination normal.  Skin: Positive for darkening of the skin in the left lower extremity with associated dry skin.  Not diaphoretic. No erythema. No pallor.  Psychiatric: Mood, memory and judgment normal.  Vitals reviewed.  LABORATORY DATA: Lab Results  Component Value Date   WBC 6.8 06/01/2020   HGB 11.6 (L) 06/01/2020   HCT 34.5 (L) 06/01/2020   MCV 97.2 06/01/2020   PLT 319 06/01/2020      Chemistry      Component Value Date/Time   NA 136 06/01/2020 1125   K 4.7 06/01/2020 1125   CL 98 06/01/2020 1125   CO2 28 06/01/2020 1125   BUN 14 06/01/2020 1125   CREATININE 1.28 (H) 06/01/2020 1125      Component Value Date/Time   CALCIUM 10.5 (H) 06/01/2020 1125   ALKPHOS 86 06/01/2020 1125   AST 29 06/01/2020 1125   ALT 57 (H) 06/01/2020 1125   BILITOT 0.3 06/01/2020 1125       RADIOGRAPHIC STUDIES:  CT Chest W Contrast  Result Date: 05/11/2020 CLINICAL DATA:  Non-small cell lung cancer staging, history of RIGHT lung cancer diagnosed in December of 2020 status  post chemo and immunotherapy. EXAM: CT CHEST, ABDOMEN, AND PELVIS WITH CONTRAST TECHNIQUE: Multidetector CT imaging of the chest, abdomen and pelvis was performed following the standard protocol during bolus administration of intravenous contrast. CONTRAST:  173mL OMNIPAQUE IOHEXOL 300 MG/ML  SOLN COMPARISON:  03/02/2020 FINDINGS: CT CHEST FINDINGS Cardiovascular: RIGHT-sided Port-A-Cath terminates at the caval to atrial junction. Heart size is normal without pericardial effusion. Scattered coronary artery calcifications. Calcified atheromatous plaque in the thoracic aorta. Venous phase assessment of central pulmonary vasculature is unremarkable. Mediastinum/Nodes: Thoracic inlet structures are normal. No axillary lymphadenopathy.  RIGHT paratracheal nodal enlargement with similar appearance (image 20, series 2) 13 mm short axis stable compared to the prior study. Mild subcarinal nodal enlargement also unchanged as well as mild fullness of the RIGHT hilum. No contralateral hilar adenopathy.  No AP window adenopathy. Lungs/Pleura: RIGHT upper lobe mass distorting the fissure (image 55, series 4) 4.1 x 3.1 cm, within 1 mm of previous measurements. Associated fissural thickening along the minor and major fissure in the RIGHT chest is similar though there is a new area of subtle nodularity along the major fissure (image 75, series 4) approximately 7 mm and another area associated with some mild fissural thickening that extends more inferiorly than it did on the prior study on image 79 of series 4. No pleural effusion. LEFT chest is clear. Airways are patent. Musculoskeletal: No acute musculoskeletal process of the bony thorax. See below for full musculoskeletal detail. CT ABDOMEN PELVIS FINDINGS Hepatobiliary: Signs of hepatic steatosis. No focal, suspicious hepatic lesion. No pericholecystic stranding. Portal vein is patent. Pancreas: Pancreas without ductal dilation or inflammation. Spleen: Spleen normal size and  contour. Adrenals/Urinary Tract: 2.3 cm RIGHT adrenal lesion unchanged. Nodularity of the LEFT adrenal is similar. Renal vascular calcifications and presumed cysts of bilateral kidneys. Urinary bladder is normal. Nephrolithiasis on the LEFT with interpolar calculus measuring 6 mm similar to the previous exam. Stomach/Bowel: Stomach and small bowel with normal appearance. No perienteric stranding. No pericolonic stranding. Normal appendix. Vascular/Lymphatic: Calcified and noncalcified plaque of the abdominal aorta with similar appearance. No adenopathy in the upper abdomen. No adenopathy in the retroperitoneum. No pelvic lymphadenopathy. Reproductive: Prostate with heterogeneity, nonspecific finding on CT. Other: Fat containing RIGHT inguinal hernia similar to prior study as is an umbilical hernia also containing fat. Musculoskeletal: No acute musculoskeletal process. Spinal degenerative changes. IMPRESSION: 1. RIGHT upper lobe mass distorting the fissure is within 1 mm of previous measurements. 2. New areas of nodularity along the minor fissure in the RIGHT chest with generalized fissural thickening extending more inferiorly than on previous imaging suspicious given patient history for disease recurrence/metastases. Consider short interval follow-up chest CT for further assessment. Given size PET is unlikely to add additional information at this time. 3. Stable mediastinal adenopathy. 4. Stable adrenal RIGHT adenoma and LEFT adrenal nodularity. 5. No evidence of metastatic disease in the abdomen or pelvis. 6. Hepatic steatosis. 7. Aortic atherosclerosis. Aortic Atherosclerosis (ICD10-I70.0). Electronically Signed   By: Zetta Bills M.D.   On: 05/11/2020 18:00   CT Abdomen Pelvis W Contrast  Result Date: 05/11/2020 CLINICAL DATA:  Non-small cell lung cancer staging, history of RIGHT lung cancer diagnosed in December of 2020 status post chemo and immunotherapy. EXAM: CT CHEST, ABDOMEN, AND PELVIS WITH CONTRAST  TECHNIQUE: Multidetector CT imaging of the chest, abdomen and pelvis was performed following the standard protocol during bolus administration of intravenous contrast. CONTRAST:  154mL OMNIPAQUE IOHEXOL 300 MG/ML  SOLN COMPARISON:  03/02/2020 FINDINGS: CT CHEST FINDINGS Cardiovascular: RIGHT-sided Port-A-Cath terminates at the caval to atrial junction. Heart size is normal without pericardial effusion. Scattered coronary artery calcifications. Calcified atheromatous plaque in the thoracic aorta. Venous phase assessment of central pulmonary vasculature is unremarkable. Mediastinum/Nodes: Thoracic inlet structures are normal. No axillary lymphadenopathy. RIGHT paratracheal nodal enlargement with similar appearance (image 20, series 2) 13 mm short axis stable compared to the prior study. Mild subcarinal nodal enlargement also unchanged as well as mild fullness of the RIGHT hilum. No contralateral hilar adenopathy.  No AP window adenopathy. Lungs/Pleura: RIGHT upper lobe mass distorting  the fissure (image 55, series 4) 4.1 x 3.1 cm, within 1 mm of previous measurements. Associated fissural thickening along the minor and major fissure in the RIGHT chest is similar though there is a new area of subtle nodularity along the major fissure (image 75, series 4) approximately 7 mm and another area associated with some mild fissural thickening that extends more inferiorly than it did on the prior study on image 79 of series 4. No pleural effusion. LEFT chest is clear. Airways are patent. Musculoskeletal: No acute musculoskeletal process of the bony thorax. See below for full musculoskeletal detail. CT ABDOMEN PELVIS FINDINGS Hepatobiliary: Signs of hepatic steatosis. No focal, suspicious hepatic lesion. No pericholecystic stranding. Portal vein is patent. Pancreas: Pancreas without ductal dilation or inflammation. Spleen: Spleen normal size and contour. Adrenals/Urinary Tract: 2.3 cm RIGHT adrenal lesion unchanged. Nodularity of  the LEFT adrenal is similar. Renal vascular calcifications and presumed cysts of bilateral kidneys. Urinary bladder is normal. Nephrolithiasis on the LEFT with interpolar calculus measuring 6 mm similar to the previous exam. Stomach/Bowel: Stomach and small bowel with normal appearance. No perienteric stranding. No pericolonic stranding. Normal appendix. Vascular/Lymphatic: Calcified and noncalcified plaque of the abdominal aorta with similar appearance. No adenopathy in the upper abdomen. No adenopathy in the retroperitoneum. No pelvic lymphadenopathy. Reproductive: Prostate with heterogeneity, nonspecific finding on CT. Other: Fat containing RIGHT inguinal hernia similar to prior study as is an umbilical hernia also containing fat. Musculoskeletal: No acute musculoskeletal process. Spinal degenerative changes. IMPRESSION: 1. RIGHT upper lobe mass distorting the fissure is within 1 mm of previous measurements. 2. New areas of nodularity along the minor fissure in the RIGHT chest with generalized fissural thickening extending more inferiorly than on previous imaging suspicious given patient history for disease recurrence/metastases. Consider short interval follow-up chest CT for further assessment. Given size PET is unlikely to add additional information at this time. 3. Stable mediastinal adenopathy. 4. Stable adrenal RIGHT adenoma and LEFT adrenal nodularity. 5. No evidence of metastatic disease in the abdomen or pelvis. 6. Hepatic steatosis. 7. Aortic atherosclerosis. Aortic Atherosclerosis (ICD10-I70.0). Electronically Signed   By: Zetta Bills M.D.   On: 05/11/2020 18:00   VAS [place] LOWER EXTREMITY VENOUS (DVT) (MC and [facility] 7a-7p)  Result Date: 05/24/2020  Lower Venous DVTStudy Indications: Pain, and Swelling X 94month.  Risk Factors: Cancer Lung, stage IV. Limitations: Body habitus and edema, pain with compression of left calf. Comparison Study: Prior study from 01/18/20 is available for comparison Performing  Technologist: Sharion Dove RVS  Examination Guidelines: A complete evaluation includes B-mode imaging, spectral Doppler, color Doppler, and power Doppler as needed of all accessible portions of each vessel. Bilateral testing is considered an integral part of a complete examination. Limited examinations for reoccurring indications may be performed as noted. The reflux portion of the exam is performed with the patient in reverse Trendelenburg.  +---------+---------------+---------+-----------+----------+--------------+ RIGHT    CompressibilityPhasicitySpontaneityPropertiesThrombus Aging +---------+---------------+---------+-----------+----------+--------------+ CFV      Full           Yes      Yes                                 +---------+---------------+---------+-----------+----------+--------------+ SFJ      Full                                                        +---------+---------------+---------+-----------+----------+--------------+  FV Prox  Full                                                        +---------+---------------+---------+-----------+----------+--------------+ FV Mid   Full                                                        +---------+---------------+---------+-----------+----------+--------------+ FV DistalFull                                                        +---------+---------------+---------+-----------+----------+--------------+ PFV      Full                                                        +---------+---------------+---------+-----------+----------+--------------+ POP      Full           Yes      Yes                                 +---------+---------------+---------+-----------+----------+--------------+ PTV      Full                                                        +---------+---------------+---------+-----------+----------+--------------+ PERO     Full                                                         +---------+---------------+---------+-----------+----------+--------------+   +---------+---------------+---------+-----------+----------+-------------------+ LEFT     CompressibilityPhasicitySpontaneityPropertiesThrombus Aging      +---------+---------------+---------+-----------+----------+-------------------+ CFV      Full           Yes      Yes                                      +---------+---------------+---------+-----------+----------+-------------------+ SFJ      Full                                                             +---------+---------------+---------+-----------+----------+-------------------+ FV Prox  Full                                                             +---------+---------------+---------+-----------+----------+-------------------+  FV Mid   Full                                                             +---------+---------------+---------+-----------+----------+-------------------+ FV DistalFull                                                             +---------+---------------+---------+-----------+----------+-------------------+ PFV      Full                                                             +---------+---------------+---------+-----------+----------+-------------------+ POP      Full           Yes      Yes                                      +---------+---------------+---------+-----------+----------+-------------------+ PTV                                                   patent by color     +---------+---------------+---------+-----------+----------+-------------------+ PERO                                                  patent by color and                                                       Doppler             +---------+---------------+---------+-----------+----------+-------------------+     Summary: BILATERAL: - No evidence of deep vein thrombosis seen in  the lower extremities, bilaterally. -No evidence of popliteal cyst, bilaterally. RIGHT: - Findings appear essentially unchanged compared to previous examination.  LEFT: - Findings appear essentially unchanged compared to previous examination.  *See table(s) above for measurements and observations. Electronically signed by Ruta Hinds MD on 05/24/2020 at 1:02:29 PM.    Final      ASSESSMENT/PLAN:  This is a very pleasant 64 year old African-American male recently diagnosed with stage IV non-small cell lung cancer, adenocarcinoma. He presented with a large right upper lobe lung mass with right hilar, subcarinal, paratracheal lymphadenopathy,and right supraclavicular lymphadenopathy. He also presented with bilateral pulmonary nodules. He was diagnosed in February 2021. He does not have any actionable mutations.  The patient is currently undergoing palliative systemic chemotherapy with carboplatin for an AUC of 5, Alimta 500 mg/m, Keytruda 200 mg IV every 3 weeks.  He is status post 7 cycles and he tolerated it well without any adverse side effects. Starting from cycle #5, the patient has been on maintenance alimta and [place].    The patient was seen with Dr. Julien Nordmann today. Recommend that he proceed with cycle #8 today as scheduled.   We will see him back for a follow up visit in 3 weeks for evaluation before starting cycle #9.   Highly encouraged the patient to attend the formal smoking cessation session on Thursday as planned which was arranged by his pulmonologist.   He will continue to take clindamycin. Dr. Julien Nordmann believes his skin changes may be secondary to his diabetes and the patient was instructed to monitor and control his blood sugar.   For the tearing, this may be secondary to the alimta. He will use refresh tears.   The patient was advised to call immediately if he has any concerning symptoms in the interval. The patient voices understanding of current disease status and  treatment options and is in agreement with the current care plan. All questions were answered. The patient knows to call the clinic with any problems, questions or concerns. We can certainly see the patient much sooner if necessary       Orders Placed This Encounter  Procedures  . CBC with Differential (Cancer Center Only)    Standing Status:   Standing    Number of Occurrences:   18    Standing Expiration Date:   06/01/2021  . CMP ([facility] only)    Standing Status:   Standing    Number of Occurrences:   18    Standing Expiration Date:   06/01/2021     Tobe Sos Skye Rodarte, PA-C 06/01/20   ADDENDUM: Hematology/Oncology Attending: I had a face-to-face encounter with the patient today.  I recommended his care plan.  This is a very pleasant 64 years old African-American male with a stage IV non-small cell lung cancer, adenocarcinoma status post induction systemic chemotherapy with carboplatin, Alimta and Keytruda for 4 cycles.  The patient is currently undergoing maintenance treatment with Alimta and Keytruda every 3 weeks status post 3 more cycles.  He has been tolerating this treatment well with no concerning adverse effects. The patient was seen recently at the emergency department for evaluation of questionable cellulitis of the left lower extremities and he is currently on treatment with doxycycline.  He is asymptomatic with no significant fever or chills and no signs of underlying severe infection. I recommended for the patient to proceed with cycle #8 today as planned.  We will continue to monitor him closely. He will come back for follow-up visit in 3 weeks for evaluation before the next cycle of his treatment. The patient was advised to call immediately if he has any concerning symptoms in the interval.  Disclaimer: This note was dictated with voice recognition software. Similar sounding words can inadvertently be transcribed and may be missed upon review. Eilleen Kempf, MD 06/01/20

## 2020-06-01 ENCOUNTER — Inpatient Hospital Stay (HOSPITAL_BASED_OUTPATIENT_CLINIC_OR_DEPARTMENT_OTHER): Payer: No Typology Code available for payment source | Admitting: Physician Assistant

## 2020-06-01 ENCOUNTER — Inpatient Hospital Stay: Payer: No Typology Code available for payment source

## 2020-06-01 ENCOUNTER — Other Ambulatory Visit: Payer: Self-pay

## 2020-06-01 VITALS — BP 132/60 | HR 79 | Temp 97.5°F | Resp 20 | Wt 243.1 lb

## 2020-06-01 DIAGNOSIS — C3491 Malignant neoplasm of unspecified part of right bronchus or lung: Secondary | ICD-10-CM

## 2020-06-01 DIAGNOSIS — Z5112 Encounter for antineoplastic immunotherapy: Secondary | ICD-10-CM

## 2020-06-01 DIAGNOSIS — Z5111 Encounter for antineoplastic chemotherapy: Secondary | ICD-10-CM

## 2020-06-01 LAB — CBC WITH DIFFERENTIAL (CANCER CENTER ONLY)
Abs Immature Granulocytes: 0.05 10*3/uL (ref 0.00–0.07)
Basophils Absolute: 0 10*3/uL (ref 0.0–0.1)
Basophils Relative: 1 %
Eosinophils Absolute: 0.5 10*3/uL (ref 0.0–0.5)
Eosinophils Relative: 7 %
HCT: 34.5 % — ABNORMAL LOW (ref 39.0–52.0)
Hemoglobin: 11.6 g/dL — ABNORMAL LOW (ref 13.0–17.0)
Immature Granulocytes: 1 %
Lymphocytes Relative: 33 %
Lymphs Abs: 2.2 10*3/uL (ref 0.7–4.0)
MCH: 32.7 pg (ref 26.0–34.0)
MCHC: 33.6 g/dL (ref 30.0–36.0)
MCV: 97.2 fL (ref 80.0–100.0)
Monocytes Absolute: 0.8 10*3/uL (ref 0.1–1.0)
Monocytes Relative: 12 %
Neutro Abs: 3.3 10*3/uL (ref 1.7–7.7)
Neutrophils Relative %: 46 %
Platelet Count: 319 10*3/uL (ref 150–400)
RBC: 3.55 MIL/uL — ABNORMAL LOW (ref 4.22–5.81)
RDW: 15.9 % — ABNORMAL HIGH (ref 11.5–15.5)
WBC Count: 6.8 10*3/uL (ref 4.0–10.5)
nRBC: 0 % (ref 0.0–0.2)

## 2020-06-01 LAB — CMP (CANCER CENTER ONLY)
ALT: 57 U/L — ABNORMAL HIGH (ref 0–44)
AST: 29 U/L (ref 15–41)
Albumin: 3.3 g/dL — ABNORMAL LOW (ref 3.5–5.0)
Alkaline Phosphatase: 86 U/L (ref 38–126)
Anion gap: 10 (ref 5–15)
BUN: 14 mg/dL (ref 8–23)
CO2: 28 mmol/L (ref 22–32)
Calcium: 10.5 mg/dL — ABNORMAL HIGH (ref 8.9–10.3)
Chloride: 98 mmol/L (ref 98–111)
Creatinine: 1.28 mg/dL — ABNORMAL HIGH (ref 0.61–1.24)
GFR, Est AFR Am: >60 mL/min (ref >60)
GFR, Estimated: 59 mL/min — ABNORMAL LOW (ref >60)
Glucose, Bld: 206 mg/dL — ABNORMAL HIGH (ref 70–99)
Potassium: 4.7 mmol/L (ref 3.5–5.1)
Sodium: 136 mmol/L (ref 135–145)
Total Bilirubin: 0.3 mg/dL (ref 0.3–1.2)
Total Protein: 7.3 g/dL (ref 6.5–8.1)

## 2020-06-01 LAB — TSH: TSH: 0.894 u[IU]/mL (ref 0.320–4.118)

## 2020-06-01 MED ORDER — SODIUM CHLORIDE 0.9 % IV SOLN
Freq: Once | INTRAVENOUS | Status: AC
  Filled 2020-06-01: qty 250

## 2020-06-01 MED ORDER — PROCHLORPERAZINE MALEATE 10 MG PO TABS
ORAL_TABLET | ORAL | Status: AC
  Filled 2020-06-01: qty 1

## 2020-06-01 MED ORDER — PROCHLORPERAZINE MALEATE 10 MG PO TABS
10.0000 mg | ORAL_TABLET | Freq: Once | ORAL | Status: AC
  Administered 2020-06-01: 10 mg via ORAL

## 2020-06-01 MED ORDER — SODIUM CHLORIDE 0.9 % IV SOLN
200.0000 mg | Freq: Once | INTRAVENOUS | Status: AC
  Administered 2020-06-01: 200 mg via INTRAVENOUS
  Filled 2020-06-01: qty 8

## 2020-06-01 MED ORDER — SODIUM CHLORIDE 0.9 % IV SOLN
500.0000 mg/m2 | Freq: Once | INTRAVENOUS | Status: AC
  Administered 2020-06-01: 1200 mg via INTRAVENOUS
  Filled 2020-06-01: qty 40

## 2020-06-01 MED ORDER — SODIUM CHLORIDE 0.9% FLUSH
10.0000 mL | Freq: Once | INTRAVENOUS | Status: AC
  Administered 2020-06-01: 10 mL
  Filled 2020-06-01: qty 10

## 2020-06-01 MED ORDER — SODIUM CHLORIDE 0.9% FLUSH
10.0000 mL | INTRAVENOUS | Status: DC | PRN
  Administered 2020-06-01: 10 mL
  Filled 2020-06-01: qty 10

## 2020-06-01 MED ORDER — HEPARIN SOD (PORK) LOCK FLUSH 100 UNIT/ML IV SOLN
500.0000 [IU] | Freq: Once | INTRAVENOUS | Status: AC | PRN
  Administered 2020-06-01: 500 [IU]
  Filled 2020-06-01: qty 5

## 2020-06-01 NOTE — Patient Instructions (Signed)
[facility] Discharge Instructions for Patients Receiving Chemotherapy  Today you received the following chemotherapy agents Keytruda and Alimta.   To help prevent nausea and vomiting after your treatment, we encourage you to take your nausea medication as directed  If you develop nausea and vomiting that is not controlled by your nausea medication, call the clinic.   BELOW ARE SYMPTOMS THAT SHOULD BE REPORTED IMMEDIATELY:  *FEVER GREATER THAN 100.5 F  *CHILLS WITH OR WITHOUT FEVER  NAUSEA AND VOMITING THAT IS NOT CONTROLLED WITH YOUR NAUSEA MEDICATION  *UNUSUAL SHORTNESS OF BREATH  *UNUSUAL BRUISING OR BLEEDING  TENDERNESS IN MOUTH AND THROAT WITH OR WITHOUT PRESENCE OF ULCERS  *URINARY PROBLEMS  *BOWEL PROBLEMS  UNUSUAL RASH Items with * indicate a potential emergency and should be followed up as soon as possible.  Feel free to call the clinic you have any questions or concerns. The clinic phone number is [phone].  Please show the [facility] at check-in to the Emergency Department and triage nurse.

## 2020-06-04 ENCOUNTER — Other Ambulatory Visit: Payer: Self-pay

## 2020-06-04 ENCOUNTER — Ambulatory Visit (INDEPENDENT_AMBULATORY_CARE_PROVIDER_SITE_OTHER): Payer: No Typology Code available for payment source | Admitting: Pharmacist

## 2020-06-04 DIAGNOSIS — Z720 Tobacco use: Secondary | ICD-10-CM

## 2020-06-04 MED ORDER — VARENICLINE TARTRATE 1 MG PO TABS: 1.0000 mg | ORAL_TABLET | Freq: Two times a day (BID) | ORAL | 5 refills | Status: DC

## 2020-06-04 MED ORDER — VARENICLINE TARTRATE 0.5 MG X 11 & 1 MG X 42 PO MISC: ORAL | 0 refills | Status: DC

## 2020-06-04 NOTE — Progress Notes (Signed)
   Subjective Patient presents to [facility] and seen by the pharmacist for smoking cessation counseling.  Patient was 15 minutes late for his appointment.  He is currently smoking 6-7 cigarettes daily.  He states he has been using nicotine patches and gum for years. At recent appointment with Wyn Quaker, FNP nicotine patch dose was increased to 21 mg and prescription written for nicotine lozenges.  Patient states he has not picked those prescriptions up yet from the [place]. It was difficult to obtain an accurate smoking history as the patient dosed off during the appointment several times. He states he suffers from sleep apnea and this is his typical "nap" time.  Social History   Tobacco Use  Smoking Status Current Every Day Smoker  . Packs/day: 0.60  . Years: 50.00  . Pack years: 30.00  . Types: Cigarettes  Smokeless Tobacco Never Used  Tobacco Comment   Pt. stated he is still trying to quit.    Quit Attempt History   Most recent quit attempt a few years ago.  Longest time ever been tobacco free 12 days.  Methods tried in the past include cold [place], nicotine patch and gum.   Rates IMPORTANCE of quitting tobacco on 1-10 scale of 9.  Rates READINESS of quitting tobacco on 1-10 scale of 7 .  Rates CONFIDENCE of quitting tobacco on 1-10 scale of 7.  Immunization History  Administered Date(s) Administered  . Pneumococcal-Unspecified 05/21/2020     Assessment and Plan  1. Smoking Cessation   Patient states they are ready to quit smoking. Reviewed STAR quit plan. Patient set quit date of September 6th.   Discussed smoking cessation agent Chantix, Wellbutrin, and nicotine replacement therapies.  Patient is agreeable to Chantix and will continue nicotine patch and lozenges.   Varenicline Initiated varenicline titration of 0.5 mg by mouth once daily with food x3 days, then 0.5 mg by mouth twice daily with food x4 days, then 1 mg by mouth twice daily with food thereafter.   CrCL greater than 30 mL/min. Patient counseled on purpose, proper use, and potential adverse effects, including GI upset, and potential change in mood. Printed prescription for Chantix starter pack and continuing month packs per patient request so he can take them to the [place].  Provided information on 1 800-QUIT NOW support program to call if his insurance does not cover Chantix.  He would like a follow-up call a week before his quit date.  All questions encouraged and answered.  Instructed patient to call with any questions or concerns.  PLAN  Continue nicotine 21 mg patch daily and lozenges as needed  Start Chantix starter pack 2 weeks prior to quit date  Follow-up call 1 week before quit date.  This appointment required 40 minutes of patient care (this includes precharting, chart review, review of results, face-to-face care, etc.).   Mariella Saa, PharmD, [place], CPP Clinical Specialty Pharmacist (Rheumatology and Pulmonology)  06/04/2020 3:21 PM

## 2020-06-04 NOTE — Patient Instructions (Signed)
Thank you for meeting with the pharmacy team today!  Below find a summary of what we discussed at your visit: . START nicotine patch daily . START nicotine lozenge as needed . START Chantix starter pack at least 2 weeks before quit date . CALL [phone] if insurance or VA doesn't cover Chantix for patient assistance  Call [phone] with any questions or concerns.   Mariella Saa, PharmD, [place], CPP Clinical Specialty Pharmacist (Rheumatology and Pulmonology)  06/04/2020 3:07 PM

## 2020-06-08 ENCOUNTER — Telehealth: Payer: Self-pay | Admitting: Pulmonary Disease

## 2020-06-08 NOTE — Telephone Encounter (Signed)
Patient requesting our office call the [facility] for his Chantix. Patient informed that medication has been recalled. Patient informed that he will need to work with the [place] to get over the counter patches and gum from the [place].

## 2020-06-20 NOTE — Progress Notes (Signed)
[facility] OFFICE PROGRESS NOTE  [name], [place]  [place] [address]  DIAGNOSIS: Stage IV (T2b, N3, M1a) non-small cell lung cancer, adenocarcinoma presented with large right upper lobe lung mass in addition to mediastinal and right supraclavicular lymphadenopathy as well as bilateral pulmonary nodules diagnosed in February 2021.  MOLECULAR STUDY by [employer]:  KRASG12C,4.3%,Binimetinib  [id number],0.9%,Niraparib, Olaparib, Rucaparib,Talazoparib, Tazemetostat  [id number],9.9%,BZJI  PRIOR THERAPY: None  CURRENT THERAPY: Systemic chemotherapy with carboplatin for AUC of 5, Alimta 500 mg/M2 and Keytruda 200 mg IV every 3 weeks. First dose December 31, 2019.Status post8cycles. Starting from cycle #5, he will be on maintenance Alimta and Keytruda.  INTERVAL HISTORY: Kevin Lambert. 64 y.o. male returns to the clinic for a follow up visit. The patient is feeling well today without any concerning complaints except he has not been able to pick up his prescriptions for smoking cessation. Pulmonology prescribed lozengers and nicoderm patches. The patient continues to tolerate treatment otherwise well, except he does have associated tearing of his eyes which may be secondary to his alimta. Denies any night sweats or weight loss.He states he has a good appetite.Denies any chest pain or hemoptysis but reports his baseline shortness of breath with exertion. He denies any significant cough. He had a formal smoking cessation counseling on 7/29. He has not been able to quit at this time. Denies any nauseaor vomiting. He has periodic constipation for which he eats fruits which helps. Denies any headache or visual changes.  The patient is here today for evaluation prior to starting cycle #9.    MEDICAL HISTORY: Past Medical History:  Diagnosis Date  . Arthritis   . Depression   . DM (diabetes mellitus) ([place])    type II  . Dyslipidemia   .  Dyspnea    unable to walk much - he thinks it is from   . GERD (gastroesophageal reflux disease)   . Hepatitis    Hepatitis C- treated  . History of kidney stones   . HTN (hypertension)   . Neuropathy   . nscl ca dx'd 10/2019  . OSA on CPAP   . PTSD (post-traumatic stress disorder)     ALLERGIES:  is allergic to metformin and related.  MEDICATIONS:  Current Outpatient Medications  Medication Sig Dispense Refill  . albuterol (VENTOLIN HFA) 108 (90 Base) MCG/ACT inhaler Inhale 2 puffs into the lungs every 4 (four) hours as needed for wheezing or shortness of breath. 18 g 3  . amLODipine (NORVASC) 10 MG tablet Take 10 mg by mouth daily.    . Ascorbic Acid (VITAMIN C) 1000 MG tablet Take 1,000 mg by mouth daily.    Marland Kitchen aspirin EC 81 MG tablet Take 81 mg by mouth daily.    Marland Kitchen atorvastatin (LIPITOR) 10 MG tablet Take 10 mg by mouth daily.    . bisacodyl (DULCOLAX) 5 MG EC tablet Take 5 mg by mouth daily as needed for moderate constipation.    . cholecalciferol (VITAMIN D3) 25 MCG (1000 UNIT) tablet Take 1,000 Units by mouth daily.    . Cyanocobalamin (VITAMIN B 12 PO) Take 1 tablet by mouth daily.    . folic acid (FOLVITE) 1 MG tablet Take 1 tablet (1 mg total) by mouth daily. Pt needs to pick up today and start ASAP . He will pay out of pocket. He is waiting on the [facility] [place] to send his folic acid which will be next week. (Patient taking differently: Take 1 mg by  mouth daily. ) 7 tablet 0  . glipiZIDE (GLUCOTROL) 5 MG tablet Take 5 mg by mouth daily before breakfast.     . lidocaine-prilocaine (EMLA) cream Apply to Port-A-Cath site 30-60-minute before treatment. 30 g 0  . magic mouthwash SOLN Take 5 mLs by mouth 4 (four) times daily as needed for mouth pain. Swish and swallow or spit 240 mL 2  . naproxen (NAPROSYN) 500 MG tablet Take 500 mg by mouth 2 (two) times daily with a meal.     . pregabalin (LYRICA) 25 MG capsule Take 1 capsule (25 mg total) by mouth daily. 30 capsule 1  . Propylene Glycol  (SYSTANE BALANCE OP) Place 1 drop into both eyes daily.    Marland Kitchen triamterene-hydrochlorothiazide (MAXZIDE-25) 37.5-25 MG tablet Take 1 tablet by mouth daily.     Marland Kitchen umeclidinium-vilanterol (ANORO ELLIPTA) 62.5-25 MCG/INH AEPB Inhale 1 puff into the lungs daily. 60 each 11  . umeclidinium-vilanterol (ANORO ELLIPTA) 62.5-25 MCG/INH AEPB Inhale 1 puff into the lungs daily. 14 each 0  . zinc gluconate 50 MG tablet Take 50 mg by mouth daily.     No current facility-administered medications for this visit.    SURGICAL HISTORY:  Past Surgical History:  Procedure Laterality Date  . BIOPSY OF MEDIASTINAL MASS  12/10/2019   Procedure: BIOPSY OF MEDIASTINAL MASS;  Surgeon: Garner Nash, DO;  Location: [facility];  Service: Pulmonary;;  right upper lobe  . BRONCHIAL NEEDLE ASPIRATION BIOPSY  12/10/2019   Procedure: BRONCHIAL NEEDLE ASPIRATION BIOPSIES;  Surgeon: Garner Nash, DO;  Location: [facility];  Service: Pulmonary;;  . COLONOSCOPY    . IR IMAGING GUIDED PORT INSERTION  12/31/2019  . kidney stone    . URETERAL STENT PLACEMENT    . Ureteral Stent Removed    . VIDEO BRONCHOSCOPY WITH ENDOBRONCHIAL ULTRASOUND N/A 12/10/2019   Procedure: VIDEO BRONCHOSCOPY WITH ENDOBRONCHIAL ULTRASOUND;  Surgeon: Garner Nash, DO;  Location: [facility];  Service: Pulmonary;  Laterality: N/A;    REVIEW OF SYSTEMS:   Review of Systems  Constitutional: Positive for fatigue. Negative for appetite change, chills, fever and unexpected weight change.  HENT: Positive for teary eyes. Negative for mouth sores, nosebleeds, sore throat and trouble swallowing.   Eyes: Negative for eye problems and icterus.  Respiratory: Positive for baseline shortness of breath with exertion. Negative for cough, hemoptysis, and wheezing.   Cardiovascular: Positive for mild leg swelling. Negative for chest pain. Gastrointestinal: Negative for abdominal pain, constipation, diarrhea, nausea and vomiting.  Genitourinary: Negative for  bladder incontinence, difficulty urinating, dysuria, frequency and hematuria.   Musculoskeletal: Negative for back pain, gait problem, neck pain and neck stiffness.  Skin: Positive for darkening of the skin in the left lower extremity with associated dry skin.  Neurological: Negative for dizziness, extremity weakness, gait problem, headaches, light-headedness and seizures.  Hematological: Negative for adenopathy. Does not bruise/bleed easily.  Psychiatric/Behavioral: Negative for confusion, depression and sleep disturbance. The patient is not nervous/anxious.     PHYSICAL EXAMINATION:  Blood pressure (!) 173/81, pulse 82, temperature 97.8 F (36.6 C), temperature source Temporal, resp. rate 18, height 5\' 9"  (1.753 m), weight 241 lb 14.4 oz (109.7 kg), SpO2 99 %.  ECOG PERFORMANCE STATUS: 1 - Symptomatic but completely ambulatory  Physical Exam  Constitutional: Oriented to person, place, and time and well-developed, well-nourished, and in no distress.  HENT:  Head: Normocephalic and atraumatic.  Mouth/Throat: Oropharynx is clear and moist. No oropharyngeal exudate.  Eyes: Positive for tearing eyes. Conjunctivae  are normal. Right eye exhibits no discharge. Left eye exhibits no discharge. No scleral icterus.  Neck: Normal range of motion. Neck supple.  Cardiovascular: Normal rate, regular rhythm, normal heart sounds and intact distal pulses.   Pulmonary/Chest: Effort normal and breath sounds normal. No respiratory distress. No wheezes. No rales.  Abdominal: Soft. Bowel sounds are normal. Exhibits no distension and no mass. There is no tenderness.  Musculoskeletal: Normal range of motion. Exhibits no edema.  Lymphadenopathy:    No cervical adenopathy.  Neurological: Alert and oriented to person, place, and time. Exhibits normal muscle tone. Gait normal. Coordination normal.  Skin: Positive for darkening of the skin in the left lower extremity with associated dry skin.  Not diaphoretic. No  erythema. No pallor.  Psychiatric: Mood, memory and judgment normal.  Vitals reviewed.  LABORATORY DATA: Lab Results  Component Value Date   WBC 6.9 06/22/2020   HGB 11.4 (L) 06/22/2020   HCT 32.5 (L) 06/22/2020   MCV 95.9 06/22/2020   PLT 259 06/22/2020      Chemistry      Component Value Date/Time   NA 137 06/22/2020 1122   K 3.9 06/22/2020 1122   CL 101 06/22/2020 1122   CO2 25 06/22/2020 1122   BUN 22 06/22/2020 1122   CREATININE 1.00 06/22/2020 1122      Component Value Date/Time   CALCIUM 10.5 (H) 06/22/2020 1122   ALKPHOS 83 06/22/2020 1122   AST 30 06/22/2020 1122   ALT 40 06/22/2020 1122   BILITOT 0.3 06/22/2020 1122       RADIOGRAPHIC STUDIES:  VAS [place] LOWER EXTREMITY VENOUS (DVT) (MC and WL 7a-7p)  Result Date: 05/24/2020  Lower Venous DVTStudy Indications: Pain, and Swelling X 98month.  Risk Factors: Cancer Lung, stage IV. Limitations: Body habitus and edema, pain with compression of left calf. Comparison Study: Prior study from 01/18/20 is available for comparison Performing Technologist: Sharion Dove RVS  Examination Guidelines: A complete evaluation includes B-mode imaging, spectral Doppler, color Doppler, and power Doppler as needed of all accessible portions of each vessel. Bilateral testing is considered an integral part of a complete examination. Limited examinations for reoccurring indications may be performed as noted. The reflux portion of the exam is performed with the patient in reverse Trendelenburg.  +---------+---------------+---------+-----------+----------+--------------+ RIGHT    CompressibilityPhasicitySpontaneityPropertiesThrombus Aging +---------+---------------+---------+-----------+----------+--------------+ CFV      Full           Yes      Yes                                 +---------+---------------+---------+-----------+----------+--------------+ SFJ      Full                                                         +---------+---------------+---------+-----------+----------+--------------+ FV Prox  Full                                                        +---------+---------------+---------+-----------+----------+--------------+ FV Mid   Full                                                        +---------+---------------+---------+-----------+----------+--------------+  FV DistalFull                                                        +---------+---------------+---------+-----------+----------+--------------+ PFV      Full                                                        +---------+---------------+---------+-----------+----------+--------------+ POP      Full           Yes      Yes                                 +---------+---------------+---------+-----------+----------+--------------+ PTV      Full                                                        +---------+---------------+---------+-----------+----------+--------------+ PERO     Full                                                        +---------+---------------+---------+-----------+----------+--------------+   +---------+---------------+---------+-----------+----------+-------------------+ LEFT     CompressibilityPhasicitySpontaneityPropertiesThrombus Aging      +---------+---------------+---------+-----------+----------+-------------------+ CFV      Full           Yes      Yes                                      +---------+---------------+---------+-----------+----------+-------------------+ SFJ      Full                                                             +---------+---------------+---------+-----------+----------+-------------------+ FV Prox  Full                                                             +---------+---------------+---------+-----------+----------+-------------------+ FV Mid   Full                                                              +---------+---------------+---------+-----------+----------+-------------------+ FV DistalFull                                                             +---------+---------------+---------+-----------+----------+-------------------+  PFV      Full                                                             +---------+---------------+---------+-----------+----------+-------------------+ POP      Full           Yes      Yes                                      +---------+---------------+---------+-----------+----------+-------------------+ PTV                                                   patent by color     +---------+---------------+---------+-----------+----------+-------------------+ PERO                                                  patent by color and                                                       Doppler             +---------+---------------+---------+-----------+----------+-------------------+     Summary: BILATERAL: - No evidence of deep vein thrombosis seen in the lower extremities, bilaterally. -No evidence of popliteal cyst, bilaterally. RIGHT: - Findings appear essentially unchanged compared to previous examination.  LEFT: - Findings appear essentially unchanged compared to previous examination.  *See table(s) above for measurements and observations. Electronically signed by Ruta Hinds MD on 05/24/2020 at 1:02:29 PM.    Final      ASSESSMENT/PLAN:  This is a very pleasant 64 year old African-American male diagnosed with stage IV non-small cell lung cancer, adenocarcinoma. He presented with a large right upper lobe lung mass with right hilar, subcarinal, paratracheal lymphadenopathy,and right supraclavicular lymphadenopathy. He also presented with bilateral pulmonary nodules. He was diagnosed in February 2021. He does not have any actionable mutations.  The patient is currently undergoing palliative systemic chemotherapy with  carboplatin for an AUC of 5, Alimta 500 mg/m, Keytruda 200 mg IV every 3 weeks. He is status post8cyclesand he tolerated it well without any adverse side effects. Starting from cycle #5, the patient has been on maintenance alimta and [place].    Labs were reviewed. Recommend that he proceed with cycle #9 today as scheduled.   I will arrange for a restaging CT scan of the chest, abdomen, and pelvis prior to his next cycle of treatment.   We will see him back for a follow up visit in 3 weeks for evaluation before starting cycle #10.   We will reach out to the [facility] to see how they prefer to receive/accept prescriptions. I am happy to send prescriptions over for nicoderm patches/lozengers.   His blood pressure was elevated today. He states he took his anti-hypertensives as prescribed. I  recommended that he obtain a BP cuff and monitor his blood pressure at home. If it continues to be high, he may need to see his PCP for dose adjustment of his medications.   The patient was advised to call immediately if he has any concerning symptoms in the interval. The patient voices understanding of current disease status and treatment options and is in agreement with the current care plan. All questions were answered. The patient knows to call the clinic with any problems, questions or concerns. We can certainly see the patient much sooner if necessary  Orders Placed This Encounter  Procedures  . CT Chest W Contrast    Standing Status:   Future    Standing Expiration Date:   06/22/2021    Order Specific Question:   If indicated for the ordered procedure, I authorize the administration of contrast media per Radiology protocol    Answer:   Yes    Order Specific Question:   Preferred imaging location?    Answer:   [facility]    Order Specific Question:   Radiology Contrast Protocol - do NOT remove file path    Answer:   \\[email or web address]  . CT Abdomen Pelvis W  Contrast    Standing Status:   Future    Standing Expiration Date:   06/22/2021    Order Specific Question:   If indicated for the ordered procedure, I authorize the administration of contrast media per Radiology protocol    Answer:   Yes    Order Specific Question:   Preferred imaging location?    Answer:   [facility]    Order Specific Question:   Is Oral Contrast requested for this exam?    Answer:   Yes, Per Radiology protocol    Order Specific Question:   Radiology Contrast Protocol - do NOT remove file path    Answer:   \\[email or web address]     [place], PA-C 06/22/20

## 2020-06-22 ENCOUNTER — Inpatient Hospital Stay: Payer: No Typology Code available for payment source

## 2020-06-22 ENCOUNTER — Inpatient Hospital Stay: Payer: No Typology Code available for payment source | Attending: Internal Medicine

## 2020-06-22 ENCOUNTER — Other Ambulatory Visit: Payer: Self-pay

## 2020-06-22 ENCOUNTER — Telehealth: Payer: Self-pay | Admitting: Physician Assistant

## 2020-06-22 ENCOUNTER — Other Ambulatory Visit: Payer: Self-pay | Admitting: Physician Assistant

## 2020-06-22 ENCOUNTER — Inpatient Hospital Stay (HOSPITAL_BASED_OUTPATIENT_CLINIC_OR_DEPARTMENT_OTHER): Payer: No Typology Code available for payment source | Admitting: Physician Assistant

## 2020-06-22 VITALS — BP 173/81 | HR 82 | Temp 97.8°F | Resp 18 | Ht 69.0 in | Wt 241.9 lb

## 2020-06-22 DIAGNOSIS — Z5112 Encounter for antineoplastic immunotherapy: Secondary | ICD-10-CM

## 2020-06-22 DIAGNOSIS — C3491 Malignant neoplasm of unspecified part of right bronchus or lung: Secondary | ICD-10-CM

## 2020-06-22 DIAGNOSIS — Z5111 Encounter for antineoplastic chemotherapy: Secondary | ICD-10-CM

## 2020-06-22 DIAGNOSIS — Z79899 Other long term (current) drug therapy: Secondary | ICD-10-CM | POA: Insufficient documentation

## 2020-06-22 DIAGNOSIS — Z95828 Presence of other vascular implants and grafts: Secondary | ICD-10-CM

## 2020-06-22 DIAGNOSIS — I10 Essential (primary) hypertension: Secondary | ICD-10-CM | POA: Diagnosis not present

## 2020-06-22 DIAGNOSIS — C3411 Malignant neoplasm of upper lobe, right bronchus or lung: Secondary | ICD-10-CM | POA: Insufficient documentation

## 2020-06-22 DIAGNOSIS — Z720 Tobacco use: Secondary | ICD-10-CM

## 2020-06-22 LAB — CBC WITH DIFFERENTIAL (CANCER CENTER ONLY)
Abs Immature Granulocytes: 0.03 10*3/uL (ref 0.00–0.07)
Basophils Absolute: 0 10*3/uL (ref 0.0–0.1)
Basophils Relative: 0 %
Eosinophils Absolute: 0.1 10*3/uL (ref 0.0–0.5)
Eosinophils Relative: 2 %
HCT: 32.5 % — ABNORMAL LOW (ref 39.0–52.0)
Hemoglobin: 11.4 g/dL — ABNORMAL LOW (ref 13.0–17.0)
Immature Granulocytes: 0 %
Lymphocytes Relative: 27 %
Lymphs Abs: 1.9 10*3/uL (ref 0.7–4.0)
MCH: 33.6 pg (ref 26.0–34.0)
MCHC: 35.1 g/dL (ref 30.0–36.0)
MCV: 95.9 fL (ref 80.0–100.0)
Monocytes Absolute: 0.9 10*3/uL (ref 0.1–1.0)
Monocytes Relative: 13 %
Neutro Abs: 4 10*3/uL (ref 1.7–7.7)
Neutrophils Relative %: 58 %
Platelet Count: 259 10*3/uL (ref 150–400)
RBC: 3.39 MIL/uL — ABNORMAL LOW (ref 4.22–5.81)
RDW: 16.9 % — ABNORMAL HIGH (ref 11.5–15.5)
WBC Count: 6.9 10*3/uL (ref 4.0–10.5)
nRBC: 0 % (ref 0.0–0.2)

## 2020-06-22 LAB — CMP (CANCER CENTER ONLY)
ALT: 40 U/L (ref 0–44)
AST: 30 U/L (ref 15–41)
Albumin: 3.4 g/dL — ABNORMAL LOW (ref 3.5–5.0)
Alkaline Phosphatase: 83 U/L (ref 38–126)
Anion gap: 11 (ref 5–15)
BUN: 22 mg/dL (ref 8–23)
CO2: 25 mmol/L (ref 22–32)
Calcium: 10.5 mg/dL — ABNORMAL HIGH (ref 8.9–10.3)
Chloride: 101 mmol/L (ref 98–111)
Creatinine: 1 mg/dL (ref 0.61–1.24)
GFR, Est AFR Am: >60 mL/min (ref >60)
GFR, Estimated: >60 mL/min (ref >60)
Glucose, Bld: 100 mg/dL — ABNORMAL HIGH (ref 70–99)
Potassium: 3.9 mmol/L (ref 3.5–5.1)
Sodium: 137 mmol/L (ref 135–145)
Total Bilirubin: 0.3 mg/dL (ref 0.3–1.2)
Total Protein: 7.2 g/dL (ref 6.5–8.1)

## 2020-06-22 LAB — TSH: TSH: 0.651 u[IU]/mL (ref 0.320–4.118)

## 2020-06-22 MED ORDER — SODIUM CHLORIDE 0.9% FLUSH
10.0000 mL | Freq: Once | INTRAVENOUS | Status: AC
  Administered 2020-06-22: 10 mL
  Filled 2020-06-22: qty 10

## 2020-06-22 MED ORDER — SODIUM CHLORIDE 0.9 % IV SOLN
Freq: Once | INTRAVENOUS | Status: AC
  Filled 2020-06-22: qty 250

## 2020-06-22 MED ORDER — PROCHLORPERAZINE MALEATE 10 MG PO TABS
10.0000 mg | ORAL_TABLET | Freq: Once | ORAL | Status: AC
  Administered 2020-06-22: 10 mg via ORAL

## 2020-06-22 MED ORDER — SODIUM CHLORIDE 0.9 % IV SOLN
200.0000 mg | Freq: Once | INTRAVENOUS | Status: AC
  Administered 2020-06-22: 200 mg via INTRAVENOUS
  Filled 2020-06-22: qty 8

## 2020-06-22 MED ORDER — NICOTINE POLACRILEX 4 MG MT LOZG: 4.0000 mg | LOZENGE | OROMUCOSAL | 3 refills | Status: DC | PRN

## 2020-06-22 MED ORDER — PROCHLORPERAZINE MALEATE 10 MG PO TABS
ORAL_TABLET | ORAL | Status: AC
  Filled 2020-06-22: qty 1

## 2020-06-22 MED ORDER — SODIUM CHLORIDE 0.9 % IV SOLN
500.0000 mg/m2 | Freq: Once | INTRAVENOUS | Status: AC
  Administered 2020-06-22: 1200 mg via INTRAVENOUS
  Filled 2020-06-22: qty 40

## 2020-06-22 MED ORDER — NICOTINE 21 MG/24HR TD PT24: 21.0000 mg | MEDICATED_PATCH | Freq: Every day | TRANSDERMAL | 3 refills | Status: DC

## 2020-06-22 MED ORDER — HEPARIN SOD (PORK) LOCK FLUSH 100 UNIT/ML IV SOLN
500.0000 [IU] | Freq: Once | INTRAVENOUS | Status: AC | PRN
  Administered 2020-06-22: 500 [IU]
  Filled 2020-06-22: qty 5

## 2020-06-22 MED ORDER — SODIUM CHLORIDE 0.9% FLUSH
10.0000 mL | INTRAVENOUS | Status: DC | PRN
  Administered 2020-06-22: 10 mL
  Filled 2020-06-22: qty 10

## 2020-06-22 NOTE — Patient Instructions (Signed)
[facility] Discharge Instructions for Patients Receiving Chemotherapy  Today you received the following chemotherapy agents Keytruda and Alimta.   To help prevent nausea and vomiting after your treatment, we encourage you to take your nausea medication as directed  If you develop nausea and vomiting that is not controlled by your nausea medication, call the clinic.   BELOW ARE SYMPTOMS THAT SHOULD BE REPORTED IMMEDIATELY:  *FEVER GREATER THAN 100.5 F  *CHILLS WITH OR WITHOUT FEVER  NAUSEA AND VOMITING THAT IS NOT CONTROLLED WITH YOUR NAUSEA MEDICATION  *UNUSUAL SHORTNESS OF BREATH  *UNUSUAL BRUISING OR BLEEDING  TENDERNESS IN MOUTH AND THROAT WITH OR WITHOUT PRESENCE OF ULCERS  *URINARY PROBLEMS  *BOWEL PROBLEMS  UNUSUAL RASH Items with * indicate a potential emergency and should be followed up as soon as possible.  Feel free to call the clinic you have any questions or concerns. The clinic phone number is [phone].  Please show the [facility] at check-in to the Emergency Department and triage nurse.

## 2020-06-22 NOTE — Telephone Encounter (Signed)
I called the patient to let him know that I faxed his prescriptions to the [facility]. He expressed understanding

## 2020-07-06 ENCOUNTER — Telehealth: Payer: Self-pay | Admitting: Pharmacist

## 2020-07-06 DIAGNOSIS — Z720 Tobacco use: Secondary | ICD-10-CM

## 2020-07-06 NOTE — Telephone Encounter (Signed)
Patient returned pharmacy call to discuss smoking cessation progress. Reports decreasing the number of cigarettes to 4 per day (originally 6-7 cigs/day) and has been using the nicotine lozenges and patches every day with no complaints/side-effects for the past week. States he is more focused now and has not been waking up to smoke in the middle of the night. Asked patient if quit date set for September 6th is still doable and patient states "will give it a shot." Congratulated patient on decreasing the number of cigarettes and encouraged patient to continue using the nicotine patches and lozenges. Will follow-up with patient in 2 weeks.  Lorel Monaco, PharmD PGY2 Ambulatory Care Resident [place]

## 2020-07-06 NOTE — Telephone Encounter (Signed)
Contacted patient to discuss smoking cessation progress, however patient did not answer. Left HIPAA compliant voicemail to contact clinical pharmacist when available.  Lorel Monaco, PharmD PGY2 Ambulatory Care Resident [place]

## 2020-07-08 ENCOUNTER — Encounter (HOSPITAL_COMMUNITY): Payer: Self-pay

## 2020-07-08 ENCOUNTER — Other Ambulatory Visit: Payer: Self-pay

## 2020-07-08 ENCOUNTER — Ambulatory Visit (HOSPITAL_COMMUNITY)
Admission: RE | Admit: 2020-07-08 | Discharge: 2020-07-08 | Disposition: A | Discharge status: Home / Self Care | Payer: No Typology Code available for payment source | Source: Ambulatory Visit | Attending: Physician Assistant | Admitting: Physician Assistant

## 2020-07-08 DIAGNOSIS — C3491 Malignant neoplasm of unspecified part of right bronchus or lung: Secondary | ICD-10-CM | POA: Insufficient documentation

## 2020-07-08 MED ORDER — HEPARIN SOD (PORK) LOCK FLUSH 100 UNIT/ML IV SOLN
INTRAVENOUS | Status: AC
  Filled 2020-07-08: qty 5

## 2020-07-08 MED ORDER — IOHEXOL 300 MG/ML  SOLN
100.0000 mL | Freq: Once | INTRAMUSCULAR | Status: AC | PRN
  Administered 2020-07-08: 100 mL via INTRAVENOUS

## 2020-07-14 ENCOUNTER — Encounter: Payer: Self-pay | Admitting: Internal Medicine

## 2020-07-14 ENCOUNTER — Inpatient Hospital Stay: Payer: No Typology Code available for payment source | Attending: Internal Medicine

## 2020-07-14 ENCOUNTER — Other Ambulatory Visit: Payer: Self-pay

## 2020-07-14 ENCOUNTER — Inpatient Hospital Stay: Payer: No Typology Code available for payment source

## 2020-07-14 ENCOUNTER — Inpatient Hospital Stay (HOSPITAL_BASED_OUTPATIENT_CLINIC_OR_DEPARTMENT_OTHER): Payer: No Typology Code available for payment source | Admitting: Internal Medicine

## 2020-07-14 VITALS — BP 160/80 | HR 96 | Temp 98.0°F | Resp 17 | Ht 69.0 in | Wt 248.4 lb

## 2020-07-14 DIAGNOSIS — I10 Essential (primary) hypertension: Secondary | ICD-10-CM

## 2020-07-14 DIAGNOSIS — Z5112 Encounter for antineoplastic immunotherapy: Secondary | ICD-10-CM | POA: Diagnosis not present

## 2020-07-14 DIAGNOSIS — Z5111 Encounter for antineoplastic chemotherapy: Secondary | ICD-10-CM | POA: Diagnosis not present

## 2020-07-14 DIAGNOSIS — C3411 Malignant neoplasm of upper lobe, right bronchus or lung: Secondary | ICD-10-CM | POA: Diagnosis present

## 2020-07-14 DIAGNOSIS — C3491 Malignant neoplasm of unspecified part of right bronchus or lung: Secondary | ICD-10-CM

## 2020-07-14 DIAGNOSIS — E119 Type 2 diabetes mellitus without complications: Secondary | ICD-10-CM | POA: Diagnosis not present

## 2020-07-14 DIAGNOSIS — Z95828 Presence of other vascular implants and grafts: Secondary | ICD-10-CM

## 2020-07-14 DIAGNOSIS — R5383 Other fatigue: Secondary | ICD-10-CM | POA: Diagnosis not present

## 2020-07-14 DIAGNOSIS — Z79899 Other long term (current) drug therapy: Secondary | ICD-10-CM | POA: Insufficient documentation

## 2020-07-14 LAB — CMP (CANCER CENTER ONLY)
ALT: 50 U/L — ABNORMAL HIGH (ref 0–44)
AST: 30 U/L (ref 15–41)
Albumin: 3.1 g/dL — ABNORMAL LOW (ref 3.5–5.0)
Alkaline Phosphatase: 75 U/L (ref 38–126)
Anion gap: 4 — ABNORMAL LOW (ref 5–15)
BUN: 16 mg/dL (ref 8–23)
CO2: 29 mmol/L (ref 22–32)
Calcium: 10.1 mg/dL (ref 8.9–10.3)
Chloride: 103 mmol/L (ref 98–111)
Creatinine: 1.09 mg/dL (ref 0.61–1.24)
GFR, Est AFR Am: >60 mL/min (ref >60)
GFR, Estimated: >60 mL/min (ref >60)
Glucose, Bld: 202 mg/dL — ABNORMAL HIGH (ref 70–99)
Potassium: 4.2 mmol/L (ref 3.5–5.1)
Sodium: 136 mmol/L (ref 135–145)
Total Bilirubin: 0.2 mg/dL — ABNORMAL LOW (ref 0.3–1.2)
Total Protein: 6.8 g/dL (ref 6.5–8.1)

## 2020-07-14 LAB — CBC WITH DIFFERENTIAL (CANCER CENTER ONLY)
Abs Immature Granulocytes: 0.01 10*3/uL (ref 0.00–0.07)
Basophils Absolute: 0 10*3/uL (ref 0.0–0.1)
Basophils Relative: 1 %
Eosinophils Absolute: 0.1 10*3/uL (ref 0.0–0.5)
Eosinophils Relative: 2 %
HCT: 31.4 % — ABNORMAL LOW (ref 39.0–52.0)
Hemoglobin: 10.6 g/dL — ABNORMAL LOW (ref 13.0–17.0)
Immature Granulocytes: 0 %
Lymphocytes Relative: 31 %
Lymphs Abs: 1.7 10*3/uL (ref 0.7–4.0)
MCH: 34.2 pg — ABNORMAL HIGH (ref 26.0–34.0)
MCHC: 33.8 g/dL (ref 30.0–36.0)
MCV: 101.3 fL — ABNORMAL HIGH (ref 80.0–100.0)
Monocytes Absolute: 0.7 10*3/uL (ref 0.1–1.0)
Monocytes Relative: 13 %
Neutro Abs: 2.9 10*3/uL (ref 1.7–7.7)
Neutrophils Relative %: 53 %
Platelet Count: 329 10*3/uL (ref 150–400)
RBC: 3.1 MIL/uL — ABNORMAL LOW (ref 4.22–5.81)
RDW: 17.5 % — ABNORMAL HIGH (ref 11.5–15.5)
WBC Count: 5.5 10*3/uL (ref 4.0–10.5)
nRBC: 0 % (ref 0.0–0.2)

## 2020-07-14 LAB — TSH: TSH: 1.038 u[IU]/mL (ref 0.320–4.118)

## 2020-07-14 MED ORDER — SODIUM CHLORIDE 0.9 % IV SOLN
200.0000 mg | Freq: Once | INTRAVENOUS | Status: AC
  Administered 2020-07-14: 200 mg via INTRAVENOUS
  Filled 2020-07-14: qty 8

## 2020-07-14 MED ORDER — PROCHLORPERAZINE MALEATE 10 MG PO TABS
10.0000 mg | ORAL_TABLET | Freq: Once | ORAL | Status: AC
  Administered 2020-07-14: 10 mg via ORAL

## 2020-07-14 MED ORDER — SODIUM CHLORIDE 0.9 % IV SOLN
500.0000 mg/m2 | Freq: Once | INTRAVENOUS | Status: AC
  Administered 2020-07-14: 1200 mg via INTRAVENOUS
  Filled 2020-07-14: qty 40

## 2020-07-14 MED ORDER — SODIUM CHLORIDE 0.9% FLUSH
10.0000 mL | Freq: Once | INTRAVENOUS | Status: AC
  Administered 2020-07-14: 10 mL
  Filled 2020-07-14: qty 10

## 2020-07-14 MED ORDER — CYANOCOBALAMIN 1000 MCG/ML IJ SOLN
INTRAMUSCULAR | Status: AC
  Filled 2020-07-14: qty 1

## 2020-07-14 MED ORDER — PROCHLORPERAZINE MALEATE 10 MG PO TABS
ORAL_TABLET | ORAL | Status: AC
  Filled 2020-07-14: qty 1

## 2020-07-14 MED ORDER — HEPARIN SOD (PORK) LOCK FLUSH 100 UNIT/ML IV SOLN
500.0000 [IU] | Freq: Once | INTRAVENOUS | Status: AC | PRN
  Administered 2020-07-14: 500 [IU]
  Filled 2020-07-14: qty 5

## 2020-07-14 MED ORDER — SODIUM CHLORIDE 0.9 % IV SOLN
Freq: Once | INTRAVENOUS | Status: AC
  Filled 2020-07-14: qty 250

## 2020-07-14 MED ORDER — SODIUM CHLORIDE 0.9% FLUSH
10.0000 mL | INTRAVENOUS | Status: DC | PRN
  Administered 2020-07-14: 10 mL
  Filled 2020-07-14: qty 10

## 2020-07-14 MED ORDER — CYANOCOBALAMIN 1000 MCG/ML IJ SOLN
1000.0000 ug | Freq: Once | INTRAMUSCULAR | Status: AC
  Administered 2020-07-14: 1000 ug via INTRAMUSCULAR

## 2020-07-14 NOTE — Patient Instructions (Signed)
Steps to Quit Smoking Smoking tobacco is the leading cause of preventable death. It can affect almost every organ in the body. Smoking puts you and people around you at risk for many serious, long-lasting (chronic) diseases. Quitting smoking can be hard, but it is one of the best things that you can do for your health. It is never too late to quit. How do I get ready to quit? When you decide to quit smoking, make a plan to help you succeed. Before you quit:  Pick a date to quit. Set a date within the next 2 weeks to give you time to prepare.  Write down the reasons why you are quitting. Keep this list in places where you will see it often.  Tell your family, friends, and co-workers that you are quitting. Their support is important.  Talk with your doctor about the choices that may help you quit.  Find out if your health insurance will pay for these treatments.  Know the people, places, things, and activities that make you want to smoke (triggers). Avoid them. What first steps can I take to quit smoking?  Throw away all cigarettes at home, at work, and in your car.  Throw away the things that you use when you smoke, such as ashtrays and lighters.  Clean your car. Make sure to empty the ashtray.  Clean your home, including curtains and carpets. What can I do to help me quit smoking? Talk with your doctor about taking medicines and seeing a counselor at the same time. You are more likely to succeed when you do both.  If you are pregnant or breastfeeding, talk with your doctor about counseling or other ways to quit smoking. Do not take medicine to help you quit smoking unless your doctor tells you to do so. To quit smoking: Quit right away  Quit smoking totally, instead of slowly cutting back on how much you smoke over a period of time.  Go to counseling. You are more likely to quit if you go to counseling sessions regularly. Take medicine You may take medicines to help you quit. Some  medicines need a prescription, and some you can buy over-the-counter. Some medicines may contain a drug called nicotine to replace the nicotine in cigarettes. Medicines may:  Help you to stop having the desire to smoke (cravings).  Help to stop the problems that come when you stop smoking (withdrawal symptoms). Your doctor may ask you to use:  Nicotine patches, gum, or lozenges.  Nicotine inhalers or sprays.  Non-nicotine medicine that is taken by mouth. Find resources Find resources and other ways to help you quit smoking and remain smoke-free after you quit. These resources are most helpful when you use them often. They include:  Online chats with a counselor.  Phone quitlines.  Printed self-help materials.  Support groups or group counseling.  Text messaging programs.  Mobile phone apps. Use apps on your mobile phone or tablet that can help you stick to your quit plan. There are many free apps for mobile phones and tablets as well as websites. Examples include Quit Guide from the CDC and smokefree.gov  What things can I do to make it easier to quit?   Talk to your family and friends. Ask them to support and encourage you.  Call a phone quitline (1-800-QUIT-NOW), reach out to support groups, or work with a counselor.  Ask people who smoke to not smoke around you.  Avoid places that make you want to smoke,   such as: ? Bars. ? Parties. ? Smoke-break areas at work.  Spend time with people who do not smoke.  Lower the stress in your life. Stress can make you want to smoke. Try these things to help your stress: ? Getting regular exercise. ? Doing deep-breathing exercises. ? Doing yoga. ? Meditating. ? Doing a body scan. To do this, close your eyes, focus on one area of your body at a time from head to toe. Notice which parts of your body are tense. Try to relax the muscles in those areas. How will I feel when I quit smoking? Day 1 to 3 weeks Within the first 24 hours,  you may start to have some problems that come from quitting tobacco. These problems are very bad 2-3 days after you quit, but they do not often last for more than 2-3 weeks. You may get these symptoms:  Mood swings.  Feeling restless, nervous, angry, or annoyed.  Trouble concentrating.  Dizziness.  Strong desire for high-sugar foods and nicotine.  Weight gain.  Trouble pooping (constipation).  Feeling like you may vomit (nausea).  Coughing or a sore throat.  Changes in how the medicines that you take for other issues work in your body.  Depression.  Trouble sleeping (insomnia). Week 3 and afterward After the first 2-3 weeks of quitting, you may start to notice more positive results, such as:  Better sense of smell and taste.  Less coughing and sore throat.  Slower heart rate.  Lower blood pressure.  Clearer skin.  Better breathing.  Fewer sick days. Quitting smoking can be hard. Do not give up if you fail the first time. Some people need to try a few times before they succeed. Do your best to stick to your quit plan, and talk with your doctor if you have any questions or concerns. Summary  Smoking tobacco is the leading cause of preventable death. Quitting smoking can be hard, but it is one of the best things that you can do for your health.  When you decide to quit smoking, make a plan to help you succeed.  Quit smoking right away, not slowly over a period of time.  When you start quitting, seek help from your doctor, family, or friends. This information is not intended to replace advice given to you by your health care provider. Make sure you discuss any questions you have with your health care provider. Document Revised: 07/19/2019 Document Reviewed: 01/12/2019 Elsevier Patient Education  2020 [employer].  

## 2020-07-14 NOTE — Patient Instructions (Signed)

## 2020-07-14 NOTE — Progress Notes (Signed)
[facility] Telephone:([phone]) [phone]   Fax:([phone]  OFFICE PROGRESS NOTE  [name], MD [address] [place]  [place] [address]  DIAGNOSIS: Stage IV (T2b, N3, M1a) non-small cell lung cancer, adenocarcinoma presented with large right upper lobe lung mass in addition to mediastinal and right supraclavicular lymphadenopathy as well as bilateral pulmonary nodules diagnosed in February 2021.  MOLECULAR STUDY by [employer]:  KRASG12C, 4.3%, Binimetinib  [id number], 0.9%, Niraparib, Olaparib, Rucaparib,Talazoparib, Tazemetostat  [id number], 1.7%, None   PRIOR THERAPY: None  CURRENT THERAPY: Systemic chemotherapy with carboplatin for AUC of 5, Alimta 500 mg/M2 and Keytruda 200 mg IV every 3 weeks.  First dose December 31, 2019.  Status post 9 cycles.  INTERVAL HISTORY: Kevin Lambert. 64 y.o. male returns to the clinic today for follow-up visit.  The patient is feeling fine today with no concerning complaints except for mild fatigue.  He denied having any chest pain, shortness of breath, cough or hemoptysis.  He denied having any fever or chills.  He has no nausea, vomiting, diarrhea or constipation.  He has no headache or visual changes.  He has been tolerating his treatment with maintenance Alimta and Keytruda fairly well.  He has no recent weight loss or night sweats.  The patient had repeat CT scan of the chest, abdomen pelvis performed recently and he is here for evaluation and discussion of his scan results.   MEDICAL HISTORY: Past Medical History:  Diagnosis Date   Arthritis    Depression    DM (diabetes mellitus) ([place])    type II   Dyslipidemia    Dyspnea    unable to walk much - he thinks it is from    GERD (gastroesophageal reflux disease)    Hepatitis    Hepatitis C- treated   History of kidney stones    HTN (hypertension)    Neuropathy    nscl ca dx'd 10/2019   OSA on CPAP    PTSD (post-traumatic stress disorder)      ALLERGIES:  is allergic to metformin and related.  MEDICATIONS:  Current Outpatient Medications  Medication Sig Dispense Refill   albuterol (VENTOLIN HFA) 108 (90 Base) MCG/ACT inhaler Inhale 2 puffs into the lungs every 4 (four) hours as needed for wheezing or shortness of breath. 18 g 3   amLODipine (NORVASC) 10 MG tablet Take 10 mg by mouth daily.     Ascorbic Acid (VITAMIN C) 1000 MG tablet Take 1,000 mg by mouth daily.     aspirin EC 81 MG tablet Take 81 mg by mouth daily.     atorvastatin (LIPITOR) 10 MG tablet Take 10 mg by mouth daily.     bisacodyl (DULCOLAX) 5 MG EC tablet Take 5 mg by mouth daily as needed for moderate constipation.     cholecalciferol (VITAMIN D3) 25 MCG (1000 UNIT) tablet Take 1,000 Units by mouth daily.     Cyanocobalamin (VITAMIN B 12 PO) Take 1 tablet by mouth daily.     folic acid (FOLVITE) 1 MG tablet Take 1 tablet (1 mg total) by mouth daily. Pt needs to pick up today and start ASAP . He will pay out of pocket. He is waiting on the [place] to send his folic acid which will be next week. (Patient taking differently: Take 1 mg by mouth daily. ) 7 tablet 0   glipiZIDE (GLUCOTROL) 5 MG tablet Take 5 mg by mouth daily before breakfast.      lidocaine-prilocaine (  EMLA) cream Apply to Port-A-Cath site 30-60-minute before treatment. 30 g 0   magic mouthwash SOLN Take 5 mLs by mouth 4 (four) times daily as needed for mouth pain. Swish and swallow or spit 240 mL 2   naproxen (NAPROSYN) 500 MG tablet Take 500 mg by mouth 2 (two) times daily with a meal.      nicotine (NICODERM CQ) 21 mg/24hr patch Place 1 patch (21 mg total) onto the skin daily. 28 patch 3   nicotine polacrilex (COMMIT) 4 MG lozenge Take 1 lozenge (4 mg total) by mouth as needed for smoking cessation. 108 tablet 3   pregabalin (LYRICA) 25 MG capsule Take 1 capsule (25 mg total) by mouth daily. 30 capsule 1   Propylene Glycol (SYSTANE BALANCE OP) Place 1 drop into both eyes daily.      triamterene-hydrochlorothiazide (MAXZIDE-25) 37.5-25 MG tablet Take 1 tablet by mouth daily.      umeclidinium-vilanterol (ANORO ELLIPTA) 62.5-25 MCG/INH AEPB Inhale 1 puff into the lungs daily. 60 each 11   umeclidinium-vilanterol (ANORO ELLIPTA) 62.5-25 MCG/INH AEPB Inhale 1 puff into the lungs daily. 14 each 0   zinc gluconate 50 MG tablet Take 50 mg by mouth daily.     No current facility-administered medications for this visit.    SURGICAL HISTORY:  Past Surgical History:  Procedure Laterality Date   BIOPSY OF MEDIASTINAL MASS  12/10/2019   Procedure: BIOPSY OF MEDIASTINAL MASS;  Surgeon: Garner Nash, DO;  Location: [facility];  Service: Pulmonary;;  right upper lobe   BRONCHIAL NEEDLE ASPIRATION BIOPSY  12/10/2019   Procedure: BRONCHIAL NEEDLE ASPIRATION BIOPSIES;  Surgeon: Garner Nash, DO;  Location: [facility];  Service: Pulmonary;;   COLONOSCOPY     IR IMAGING GUIDED PORT INSERTION  12/31/2019   kidney stone     URETERAL STENT PLACEMENT     Ureteral Stent Removed     VIDEO BRONCHOSCOPY WITH ENDOBRONCHIAL ULTRASOUND N/A 12/10/2019   Procedure: VIDEO BRONCHOSCOPY WITH ENDOBRONCHIAL ULTRASOUND;  Surgeon: Garner Nash, DO;  Location: [facility];  Service: Pulmonary;  Laterality: N/A;    REVIEW OF SYSTEMS:  Constitutional: positive for fatigue Eyes: negative Ears, nose, mouth, throat, and face: negative Respiratory: negative Cardiovascular: negative Gastrointestinal: negative Genitourinary:negative Integument/breast: negative Hematologic/lymphatic: negative Musculoskeletal:negative Neurological: negative Behavioral/Psych: negative Endocrine: negative Allergic/Immunologic: negative   PHYSICAL EXAMINATION: General appearance: alert, cooperative, fatigued and no distress Head: Normocephalic, without obvious abnormality, atraumatic Neck: no adenopathy, no JVD, supple, symmetrical, trachea midline and thyroid not enlarged, symmetric, no  tenderness/mass/nodules Lymph nodes: Cervical, supraclavicular, and axillary nodes normal. Resp: clear to auscultation bilaterally Back: symmetric, no curvature. ROM normal. No CVA tenderness. Cardio: regular rate and rhythm, S1, S2 normal, no murmur, click, rub or gallop GI: soft, non-tender; bowel sounds normal; no masses,  no organomegaly Extremities: extremities normal, atraumatic, no cyanosis or edema Neurologic: Alert and oriented X 3, normal strength and tone. Normal symmetric reflexes. Normal coordination and gait  ECOG PERFORMANCE STATUS: 1 - Symptomatic but completely ambulatory  Blood pressure (!) 160/80, pulse 96, temperature 98 F (36.7 C), temperature source Tympanic, resp. rate 17, height 5\' 9"  (1.753 m), weight 248 lb 6.4 oz (112.7 kg), SpO2 99 %.  LABORATORY DATA: Lab Results  Component Value Date   WBC 6.9 06/22/2020   HGB 11.4 (L) 06/22/2020   HCT 32.5 (L) 06/22/2020   MCV 95.9 06/22/2020   PLT 259 06/22/2020      Chemistry      Component Value Date/Time  NA 137 06/22/2020 1122   K 3.9 06/22/2020 1122   CL 101 06/22/2020 1122   CO2 25 06/22/2020 1122   BUN 22 06/22/2020 1122   CREATININE 1.00 06/22/2020 1122      Component Value Date/Time   CALCIUM 10.5 (H) 06/22/2020 1122   ALKPHOS 83 06/22/2020 1122   AST 30 06/22/2020 1122   ALT 40 06/22/2020 1122   BILITOT 0.3 06/22/2020 1122       RADIOGRAPHIC STUDIES: CT Chest W Contrast  Result Date: 07/08/2020 CLINICAL DATA:  Primary Cancer Type: Lung Imaging Indication: Routine surveillance Interval therapy since last imaging? Yes Initial Cancer Diagnosis Date: 12/10/2019; Established by: Biopsy-proven Detailed Pathology: Stage IV non-small cell lung cancer, adenocarcinoma. Primary Tumor location: Right upper lobe. Surgeries: No Chemotherapy: Yes; Ongoing? Yes; Most recent administration: 06/22/2020 Immunotherapy?  Yes; Type: Keytruda; Ongoing? Yes Radiation therapy? No EXAM: CT CHEST, ABDOMEN, AND PELVIS  WITH CONTRAST TECHNIQUE: Multidetector CT imaging of the chest, abdomen and pelvis was performed following the standard protocol during bolus administration of intravenous contrast. CONTRAST:  13mL OMNIPAQUE IOHEXOL 300 MG/ML SOLN, additional oral enteric contrast COMPARISON:  Most recent CT chest, abdomen and pelvis 05/08/2020. 12/09/2019 PET-CT. FINDINGS: CT CHEST FINDINGS Cardiovascular: Right chest port catheter. Aortic atherosclerosis. Normal heart size. Three-vessel coronary artery calcifications and/or stents. No pericardial effusion. Mediastinum/Nodes: Unchanged enlarged, matted pretracheal lymph nodes and subcarinal and right hilar soft tissue thickening, nodes measuring up to 1.7 x 1.3 cm (series 2, image 19). Thyroid gland, trachea, and esophagus demonstrate no significant findings. Lungs/Pleura: No significant interval change in a mass of the posterior right upper lobe abutting the major fissure measuring approximately 3.7 x 3.1 cm (series 6, image 53). Unchanged fissural nodularity of the posterior right middle lobe along the major fissure, measuring up to 6 mm (series 6, image 74). Middle lobe no pleural effusion or pneumothorax. Musculoskeletal: No chest wall mass or suspicious bone lesions identified. CT ABDOMEN PELVIS FINDINGS Hepatobiliary: Hepatic steatosis. No gallstones, gallbladder wall thickening, or biliary dilatation. Pancreas: Unremarkable. No pancreatic ductal dilatation or surrounding inflammatory changes. Spleen: Normal in size without significant abnormality. Adrenals/Urinary Tract: No significant interval change in a nodule of the right adrenal gland measuring 2.5 x 1.9 cm (series 2, image 52). No significant interval change in a nodule of the left adrenal gland measuring 1.8 x 1.3 cm (series 2, image 55). Nonobstructive calculus of the midportion of the left kidney. No hydronephrosis. Thickening of the urinary bladder, likely secondary to chronic outlet obstruction. Stomach/Bowel:  Stomach is within normal limits. Appendix appears normal. No evidence of bowel wall thickening, distention, or inflammatory changes. Vascular/Lymphatic: Aortic atherosclerosis. No enlarged abdominal or pelvic lymph nodes. Reproductive: No mass or other abnormality. Other: No abdominal wall hernia or abnormality. No abdominopelvic ascites. Musculoskeletal: No acute or significant osseous findings. IMPRESSION: 1. No significant interval change in a mass of the posterior right upper lobe abutting the major fissure. 2. Unchanged enlarged, matted pretracheal lymph nodes and post treatment subcarinal and right hilar soft tissue thickening. 3. Unchanged fissural nodularity of the posterior right middle lobe along the major fissure, nonspecific, although metastatic disease is not excluded. Attention on follow-up. 4. No significant change in adrenal gland nodules, very likely benign adenomata without significant FDG avidity on prior PET-CT. Attention on follow-up. 5. No evidence of metastatic disease in the abdomen or pelvis. 6. Hepatic steatosis. 7. Coronary artery disease.  Aortic Atherosclerosis (ICD10-I70.0). Electronically Signed   By: Eddie Candle M.D.   On: 07/08/2020 10:03  CT Abdomen Pelvis W Contrast  Result Date: 07/08/2020 CLINICAL DATA:  Primary Cancer Type: Lung Imaging Indication: Routine surveillance Interval therapy since last imaging? Yes Initial Cancer Diagnosis Date: 12/10/2019; Established by: Biopsy-proven Detailed Pathology: Stage IV non-small cell lung cancer, adenocarcinoma. Primary Tumor location: Right upper lobe. Surgeries: No Chemotherapy: Yes; Ongoing? Yes; Most recent administration: 06/22/2020 Immunotherapy?  Yes; Type: Keytruda; Ongoing? Yes Radiation therapy? No EXAM: CT CHEST, ABDOMEN, AND PELVIS WITH CONTRAST TECHNIQUE: Multidetector CT imaging of the chest, abdomen and pelvis was performed following the standard protocol during bolus administration of intravenous contrast. CONTRAST:   129mL OMNIPAQUE IOHEXOL 300 MG/ML SOLN, additional oral enteric contrast COMPARISON:  Most recent CT chest, abdomen and pelvis 05/08/2020. 12/09/2019 PET-CT. FINDINGS: CT CHEST FINDINGS Cardiovascular: Right chest port catheter. Aortic atherosclerosis. Normal heart size. Three-vessel coronary artery calcifications and/or stents. No pericardial effusion. Mediastinum/Nodes: Unchanged enlarged, matted pretracheal lymph nodes and subcarinal and right hilar soft tissue thickening, nodes measuring up to 1.7 x 1.3 cm (series 2, image 19). Thyroid gland, trachea, and esophagus demonstrate no significant findings. Lungs/Pleura: No significant interval change in a mass of the posterior right upper lobe abutting the major fissure measuring approximately 3.7 x 3.1 cm (series 6, image 53). Unchanged fissural nodularity of the posterior right middle lobe along the major fissure, measuring up to 6 mm (series 6, image 74). Middle lobe no pleural effusion or pneumothorax. Musculoskeletal: No chest wall mass or suspicious bone lesions identified. CT ABDOMEN PELVIS FINDINGS Hepatobiliary: Hepatic steatosis. No gallstones, gallbladder wall thickening, or biliary dilatation. Pancreas: Unremarkable. No pancreatic ductal dilatation or surrounding inflammatory changes. Spleen: Normal in size without significant abnormality. Adrenals/Urinary Tract: No significant interval change in a nodule of the right adrenal gland measuring 2.5 x 1.9 cm (series 2, image 52). No significant interval change in a nodule of the left adrenal gland measuring 1.8 x 1.3 cm (series 2, image 55). Nonobstructive calculus of the midportion of the left kidney. No hydronephrosis. Thickening of the urinary bladder, likely secondary to chronic outlet obstruction. Stomach/Bowel: Stomach is within normal limits. Appendix appears normal. No evidence of bowel wall thickening, distention, or inflammatory changes. Vascular/Lymphatic: Aortic atherosclerosis. No enlarged  abdominal or pelvic lymph nodes. Reproductive: No mass or other abnormality. Other: No abdominal wall hernia or abnormality. No abdominopelvic ascites. Musculoskeletal: No acute or significant osseous findings. IMPRESSION: 1. No significant interval change in a mass of the posterior right upper lobe abutting the major fissure. 2. Unchanged enlarged, matted pretracheal lymph nodes and post treatment subcarinal and right hilar soft tissue thickening. 3. Unchanged fissural nodularity of the posterior right middle lobe along the major fissure, nonspecific, although metastatic disease is not excluded. Attention on follow-up. 4. No significant change in adrenal gland nodules, very likely benign adenomata without significant FDG avidity on prior PET-CT. Attention on follow-up. 5. No evidence of metastatic disease in the abdomen or pelvis. 6. Hepatic steatosis. 7. Coronary artery disease.  Aortic Atherosclerosis (ICD10-I70.0). Electronically Signed   By: Eddie Candle M.D.   On: 07/08/2020 10:03    ASSESSMENT AND PLAN: This is a very pleasant 64 years old African-American male recently diagnosed with a stage IV non-small cell lung cancer, adenocarcinoma with no actionable mutations presented with large right lower lobe lung mass in addition to mediastinal and right supraclavicular lymphadenopathy as well as bilateral pulmonary nodules diagnosed in February 2021. I explained to the patient that he has incurable condition and all the treatment options will be of palliative nature. The patient has no  actionable mutations on the recent molecular studies and he is not a candidate for the [facility] clinical trial. He started systemic chemotherapy with carboplatin, Alimta and Keytruda status post 9 cycles.  Starting from cycle #5 the patient is on maintenance treatment with Alimta and Keytruda every 3 weeks. The patient has been tolerating this treatment well with no concerning adverse effect except for mild fatigue. He had  repeat CT scan of the chest, abdomen pelvis performed recently.  I personally and independently reviewed the scan and discussed the results with the patient today. His scan showed no concerning findings for disease progression. I recommended for the patient to continue his current maintenance treatment with Alimta and Keytruda and he will proceed with cycle #10 today. For the hypertension he was advised to monitor his blood pressure closely at home and he will take his blood pressure medication as prescribed. The patient will come back for follow-up visit in 3 weeks for evaluation before the next cycle of his treatment. He was advised to call immediately if he has any concerning symptoms in the interval. The patient voices understanding of current disease status and treatment options and is in agreement with the current care plan.  All questions were answered. The patient knows to call the clinic with any problems, questions or concerns. We can certainly see the patient much sooner if necessary.  Disclaimer: This note was dictated with voice recognition software. Similar sounding words can inadvertently be transcribed and may not be corrected upon review.

## 2020-07-14 NOTE — Patient Instructions (Signed)
[facility] Discharge Instructions for Patients Receiving Chemotherapy  Today you received the following chemotherapy agents Keytruda and Alimta.   To help prevent nausea and vomiting after your treatment, we encourage you to take your nausea medication as directed  If you develop nausea and vomiting that is not controlled by your nausea medication, call the clinic.   BELOW ARE SYMPTOMS THAT SHOULD BE REPORTED IMMEDIATELY:  *FEVER GREATER THAN 100.5 F  *CHILLS WITH OR WITHOUT FEVER  NAUSEA AND VOMITING THAT IS NOT CONTROLLED WITH YOUR NAUSEA MEDICATION  *UNUSUAL SHORTNESS OF BREATH  *UNUSUAL BRUISING OR BLEEDING  TENDERNESS IN MOUTH AND THROAT WITH OR WITHOUT PRESENCE OF ULCERS  *URINARY PROBLEMS  *BOWEL PROBLEMS  UNUSUAL RASH Items with * indicate a potential emergency and should be followed up as soon as possible.  Feel free to call the clinic you have any questions or concerns. The clinic phone number is [phone].  Please show the [facility] at check-in to the Emergency Department and triage nurse.

## 2020-07-23 ENCOUNTER — Telehealth: Payer: Self-pay | Admitting: Pulmonary Disease

## 2020-07-23 NOTE — Telephone Encounter (Signed)
Spoke to [place] and we are waiting on auth from [place] for sleep study.  They covered pulmonary but not sleep.  Called pt & made him aware.  He states he will call pcp at [facility] now.  Nothing further needed.

## 2020-07-24 NOTE — Telephone Encounter (Signed)
Patient states needs to send fax prior authorization to Dr. Domenica Fail fax number [phone]. For home sleep test. Attn Dr. Domenica Fail. Patient phone is [phone].

## 2020-07-27 ENCOUNTER — Telehealth: Payer: Self-pay | Admitting: Pharmacist

## 2020-07-27 DIAGNOSIS — Z720 Tobacco use: Secondary | ICD-10-CM

## 2020-07-27 NOTE — Telephone Encounter (Signed)
Left message for this pt to verofy what he wants [place] to do [place]

## 2020-07-27 NOTE — Telephone Encounter (Signed)
Contacted patient to discuss smoking cessation progress. Patient reports smoking 2 cigs yesterday and over the past 10 days has only smoked 4 cigarettes total (originally 4 cigs/day). Reports using nicotine lozenges and patches occasionally and not daily because he is trying to limit nicotine exposure and quit cold [place]. Denies any side-effects. His personal goal is to not smoke for 2-3 days straight. Patient will like to set new quit date for October 8th. Discussed setting small goals leading up to quit date. Patient agreeable to decrease to 2 cigs per week by the end of September. Congratulated patient on decreasing the number of current cigarettes and encouraged patient to utilize the nicotine patches and lozenges as they can help reduce urges. Will follow-up with patient in 3-4 weeks.  Lorel Monaco, PharmD PGY2 Ambulatory Care Resident [place]

## 2020-07-27 NOTE — Telephone Encounter (Signed)
Spoke to pt we faxed the Home Study order to [facility] and she will get the auth for [place] to do the [place]

## 2020-08-01 NOTE — Progress Notes (Signed)
Kevin Lambert OFFICE PROGRESS NOTE  Kevin Lambert, [place]  [place] [address]  DIAGNOSIS: Stage IV (T2b, N3, M1a) non-small cell lung cancer, adenocarcinoma presented with large right upper lobe lung mass in addition to mediastinal and right supraclavicular lymphadenopathy as well as bilateral pulmonary nodules diagnosed in February 2021.  MOLECULAR STUDY by [employer]:  KRASG12C,4.3%,Binimetinib  [id number],0.9%,Niraparib, Olaparib, Rucaparib,Talazoparib, Tazemetostat  [id number],2.6%,RSWN  PRIOR THERAPY: None  CURRENT THERAPY: Systemic chemotherapy with carboplatin for AUC of 5, Alimta 500 mg/M2 and Keytruda 200 mg IV every 3 weeks. First dose December 31, 2019.Status post10cycles. Starting from cycle #5, he will be on maintenance Alimta and Keytruda.  INTERVAL HISTORY: Kevin Lambert. 64 y.o. male returns to the clinic for a follow up visit. The patient is feeling unwell and concerned today with the chief concern of bilateral upper extremity swelling.  This started several months ago but has gotten worse over the course of the last 3 weeks or so.   He had been worked up for lower extremity swelling several months ago and had a Doppler ultrasound of the lower extremities performed which was negative for DVT.  The patient denies any known cardiac history except for hypertension.  He is currently on HCTZ as well as Norvasc for hypertension.  He denies any kidney disease.   Otherwise, the patient is feeling well and tolerated his last cycle of treatment without any concerning complaints. Denies any night sweats or weight loss. He actually gained several pounds which is likely secondary to swelling.  He states he has a good appetite.Denies any chest pain or hemoptysis but reports his baseline shortness of breath with exertion. He denies any significant cough. He has been attending formal smoking cessation counseling and he is currently smoking 1  cigar per day. Denies any nausea, vomiting, diarrhea, or constipation.  Denies any headache or visual changes. The patient is here today for evaluation prior to starting cycle #11.    MEDICAL HISTORY: Past Medical History:  Diagnosis Date  . Arthritis   . Depression   . DM (diabetes mellitus) ([place])    type II  . Dyslipidemia   . Dyspnea    unable to walk much - he thinks it is from   . GERD (gastroesophageal reflux disease)   . Hepatitis    Hepatitis C- treated  . History of kidney stones   . HTN (hypertension)   . Neuropathy   . nscl ca dx'd 10/2019  . OSA on CPAP   . PTSD (post-traumatic stress disorder)     ALLERGIES:  is allergic to metformin and related.  MEDICATIONS:  Current Outpatient Medications  Medication Sig Dispense Refill  . albuterol (VENTOLIN HFA) 108 (90 Base) MCG/ACT inhaler Inhale 2 puffs into the lungs every 4 (four) hours as needed for wheezing or shortness of breath. 18 g 3  . amLODipine (NORVASC) 10 MG tablet Take 10 mg by mouth daily.    . Ascorbic Acid (VITAMIN C) 1000 MG tablet Take 1,000 mg by mouth daily.    Marland Kitchen aspirin EC 81 MG tablet Take 81 mg by mouth daily.    Marland Kitchen atorvastatin (LIPITOR) 10 MG tablet Take 10 mg by mouth daily.    . bisacodyl (DULCOLAX) 5 MG EC tablet Take 5 mg by mouth daily as needed for moderate constipation.    . cholecalciferol (VITAMIN D3) 25 MCG (1000 UNIT) tablet Take 1,000 Units by mouth daily.    . Cyanocobalamin (VITAMIN B 12 PO) Take  1 tablet by mouth daily.    . folic acid (FOLVITE) 1 MG tablet Take 1 tablet (1 mg total) by mouth daily. Pt needs to pick up today and start ASAP . He will pay out of pocket. He is waiting on the [place] to send his folic acid which will be next week. (Patient taking differently: Take 1 mg by mouth daily. ) 7 tablet 0  . furosemide (LASIX) 20 MG tablet Take 1 tablet (20 mg total) by mouth 2 (two) times daily. 20 tablet 0  . glipiZIDE (GLUCOTROL) 5 MG tablet Take 5 mg by mouth daily before  breakfast.     . lidocaine-prilocaine (EMLA) cream Apply to Port-A-Cath site 30-60-minute before treatment. 30 g 0  . magic mouthwash SOLN Take 5 mLs by mouth 4 (four) times daily as needed for mouth pain. Swish and swallow or spit 240 mL 2  . naproxen (NAPROSYN) 500 MG tablet Take 500 mg by mouth 2 (two) times daily with a meal.     . nicotine (NICODERM CQ) 21 mg/24hr patch Place 1 patch (21 mg total) onto the skin daily. 28 patch 3  . nicotine polacrilex (COMMIT) 4 MG lozenge Take 1 lozenge (4 mg total) by mouth as needed for smoking cessation. 108 tablet 3  . potassium chloride SA (KLOR-CON) 20 MEQ tablet Take 1 tablet (20 mEq total) by mouth daily. 10 tablet 0  . pregabalin (LYRICA) 25 MG capsule Take 1 capsule (25 mg total) by mouth daily. 30 capsule 1  . Propylene Glycol (SYSTANE BALANCE OP) Place 1 drop into both eyes daily.    Marland Kitchen triamterene-hydrochlorothiazide (MAXZIDE-25) 37.5-25 MG tablet Take 1 tablet by mouth daily.     Marland Kitchen umeclidinium-vilanterol (ANORO ELLIPTA) 62.5-25 MCG/INH AEPB Inhale 1 puff into the lungs daily. 60 each 11  . umeclidinium-vilanterol (ANORO ELLIPTA) 62.5-25 MCG/INH AEPB Inhale 1 puff into the lungs daily. 14 each 0  . zinc gluconate 50 MG tablet Take 50 mg by mouth daily.     No current facility-administered medications for this visit.   Facility-Administered Medications Ordered in Other Visits  Medication Dose Route Frequency Provider Last Rate Last Admin  . heparin lock flush 100 unit/mL  500 Units Intracatheter Once PRN Curt Bears, MD      . pembrolizumab [facility]) 200 mg in sodium chloride 0.9 % 50 mL chemo infusion  200 mg Intravenous Once Curt Bears, MD 116 mL/hr at 08/03/20 1329 200 mg at 08/03/20 1329  . PEMEtrexed (ALIMTA) 1,200 mg in sodium chloride 0.9 % 100 mL chemo infusion  500 mg/m2 (Treatment Plan Recorded) Intravenous Once Curt Bears, MD      . sodium chloride flush (NS) 0.9 % injection 10 mL  10 mL Intracatheter PRN Curt Bears, MD        SURGICAL HISTORY:  Past Surgical History:  Procedure Laterality Date  . BIOPSY OF MEDIASTINAL MASS  12/10/2019   Procedure: BIOPSY OF MEDIASTINAL MASS;  Surgeon: Garner Nash, DO;  Location: [facility];  Service: Pulmonary;;  right upper lobe  . BRONCHIAL NEEDLE ASPIRATION BIOPSY  12/10/2019   Procedure: BRONCHIAL NEEDLE ASPIRATION BIOPSIES;  Surgeon: Garner Nash, DO;  Location: [facility];  Service: Pulmonary;;  . COLONOSCOPY    . IR IMAGING GUIDED PORT INSERTION  12/31/2019  . kidney stone    . URETERAL STENT PLACEMENT    . Ureteral Stent Removed    . VIDEO BRONCHOSCOPY WITH ENDOBRONCHIAL ULTRASOUND N/A 12/10/2019   Procedure: VIDEO BRONCHOSCOPY WITH ENDOBRONCHIAL ULTRASOUND;  Surgeon: Garner Nash, DO;  Location: [facility];  Service: Pulmonary;  Laterality: N/A;    REVIEW OF SYSTEMS:   Review of Systems  Constitutional:Positive for fatigue.Negative for appetite change, chills, fever and unexpected weight change.  HENT: Positive for teary eyes. Negative for mouth sores, nosebleeds, sore throat and trouble swallowing.  Eyes: Negative for eye problems and icterus.  Respiratory:Positive for baseline shortness of breath with exertion.Negative for cough, hemoptysis, and wheezing.  Cardiovascular:Positive for bilateral upper extremity swelling.  Positive for some bilateral lower extremity swelling.negative for chest pain. Gastrointestinal: Negative for abdominal pain, constipation, diarrhea, nausea and vomiting.  Genitourinary: Negative for bladder incontinence, difficulty urinating, dysuria, frequency and hematuria.  Musculoskeletal: Negative for back pain, gait problem, neck pain and neck stiffness.  Skin:Positive for darkening of the skin in the left lower extremity with associated dry skin. Neurological: Negative for dizziness, extremity weakness, gait problem, headaches, light-headedness and seizures.  Hematological: Negative for adenopathy.  Does not bruise/bleed easily.  Psychiatric/Behavioral: Negative for confusion, depression and sleep disturbance. The patient is not nervous/anxious.    PHYSICAL EXAMINATION:  Blood pressure (!) 152/66, pulse 75, temperature (!) 97.1 F (36.2 C), temperature source Tympanic, resp. rate 18, height 5\' 9"  (1.753 m), weight 253 lb 14.4 oz (115.2 kg), SpO2 100 %.  ECOG PERFORMANCE STATUS: 1 - Symptomatic but completely ambulatory  Physical Exam  Constitutional: Oriented to person, place, and time and well-developed, well-nourished, and in no distress.  HENT:  Head: Normocephalic and atraumatic.  Mouth/Throat: Oropharynx is clear and moist. No oropharyngeal exudate.  Eyes:Positive for tearing eyes.Conjunctivae are normal. Right eye exhibits no discharge. Left eye exhibits no discharge. No scleral icterus.  Neck: Normal range of motion. Neck supple.  Cardiovascular: Normal rate, regular rhythm, normal heart sounds and intact distal pulses.  Pulmonary/Chest: Effort normal and breath sounds normal. No respiratory distress. No wheezes. No rales.  Abdominal: Soft. Bowel sounds are normal. Exhibits no distension and no mass. There is no tenderness.  Musculoskeletal: Positive for bilateral upper extremity swelling.  Positive for some bilateral lower extremity swelling. normal range of motion.  Lymphadenopathy:  No cervical adenopathy.  Neurological: Alert and oriented to person, place, and time. Exhibits normal muscle tone. Gait normal. Coordination normal.  Skin:Positive for darkening of the skin in the left lower extremity with associated dry skin.Not diaphoretic. No erythema. No pallor.  Psychiatric: Mood, memory and judgment normal.  Vitals reviewed.  LABORATORY DATA: Lab Results  Component Value Date   WBC 6.9 08/03/2020   HGB 10.1 (L) 08/03/2020   HCT 30.3 (L) 08/03/2020   MCV 104.5 (H) 08/03/2020   PLT 366 08/03/2020      Chemistry      Component Value Date/Time   NA 138  08/03/2020 1118   K 3.9 08/03/2020 1118   CL 103 08/03/2020 1118   CO2 28 08/03/2020 1118   BUN 17 08/03/2020 1118   CREATININE 1.38 (H) 08/03/2020 1118      Component Value Date/Time   CALCIUM 10.0 08/03/2020 1118   ALKPHOS 71 08/03/2020 1118   AST 33 08/03/2020 1118   ALT 71 (H) 08/03/2020 1118   BILITOT 0.2 (L) 08/03/2020 1118       RADIOGRAPHIC STUDIES:  CT Chest W Contrast  Result Date: 07/08/2020 CLINICAL DATA:  Primary Cancer Type: Lung Imaging Indication: Routine surveillance Interval therapy since last imaging? Yes Initial Cancer Diagnosis Date: 12/10/2019; Established by: Biopsy-proven Detailed Pathology: Stage IV non-small cell lung cancer, adenocarcinoma. Primary Tumor location: Right upper lobe.  Surgeries: No Chemotherapy: Yes; Ongoing? Yes; Most recent administration: 06/22/2020 Immunotherapy?  Yes; Type: Keytruda; Ongoing? Yes Radiation therapy? No EXAM: CT CHEST, ABDOMEN, AND PELVIS WITH CONTRAST TECHNIQUE: Multidetector CT imaging of the chest, abdomen and pelvis was performed following the standard protocol during bolus administration of intravenous contrast. CONTRAST:  197mL OMNIPAQUE IOHEXOL 300 MG/ML SOLN, additional oral enteric contrast COMPARISON:  Most recent CT chest, abdomen and pelvis 05/08/2020. 12/09/2019 PET-CT. FINDINGS: CT CHEST FINDINGS Cardiovascular: Right chest port catheter. Aortic atherosclerosis. Normal heart size. Three-vessel coronary artery calcifications and/or stents. No pericardial effusion. Mediastinum/Nodes: Unchanged enlarged, matted pretracheal lymph nodes and subcarinal and right hilar soft tissue thickening, nodes measuring up to 1.7 x 1.3 cm (series 2, image 19). Thyroid gland, trachea, and esophagus demonstrate no significant findings. Lungs/Pleura: No significant interval change in a mass of the posterior right upper lobe abutting the major fissure measuring approximately 3.7 x 3.1 cm (series 6, image 53). Unchanged fissural nodularity of the  posterior right middle lobe along the major fissure, measuring up to 6 mm (series 6, image 74). Middle lobe no pleural effusion or pneumothorax. Musculoskeletal: No chest wall mass or suspicious bone lesions identified. CT ABDOMEN PELVIS FINDINGS Hepatobiliary: Hepatic steatosis. No gallstones, gallbladder wall thickening, or biliary dilatation. Pancreas: Unremarkable. No pancreatic ductal dilatation or surrounding inflammatory changes. Spleen: Normal in size without significant abnormality. Adrenals/Urinary Tract: No significant interval change in a nodule of the right adrenal gland measuring 2.5 x 1.9 cm (series 2, image 52). No significant interval change in a nodule of the left adrenal gland measuring 1.8 x 1.3 cm (series 2, image 55). Nonobstructive calculus of the midportion of the left kidney. No hydronephrosis. Thickening of the urinary bladder, likely secondary to chronic outlet obstruction. Stomach/Bowel: Stomach is within normal limits. Appendix appears normal. No evidence of bowel wall thickening, distention, or inflammatory changes. Vascular/Lymphatic: Aortic atherosclerosis. No enlarged abdominal or pelvic lymph nodes. Reproductive: No mass or other abnormality. Other: No abdominal wall hernia or abnormality. No abdominopelvic ascites. Musculoskeletal: No acute or significant osseous findings. IMPRESSION: 1. No significant interval change in a mass of the posterior right upper lobe abutting the major fissure. 2. Unchanged enlarged, matted pretracheal lymph nodes and post treatment subcarinal and right hilar soft tissue thickening. 3. Unchanged fissural nodularity of the posterior right middle lobe along the major fissure, nonspecific, although metastatic disease is not excluded. Attention on follow-up. 4. No significant change in adrenal gland nodules, very likely benign adenomata without significant FDG avidity on prior PET-CT. Attention on follow-up. 5. No evidence of metastatic disease in the  abdomen or pelvis. 6. Hepatic steatosis. 7. Coronary artery disease.  Aortic Atherosclerosis (ICD10-I70.0). Electronically Signed   By: Eddie Candle M.D.   On: 07/08/2020 10:03   CT Abdomen Pelvis W Contrast  Result Date: 07/08/2020 CLINICAL DATA:  Primary Cancer Type: Lung Imaging Indication: Routine surveillance Interval therapy since last imaging? Yes Initial Cancer Diagnosis Date: 12/10/2019; Established by: Biopsy-proven Detailed Pathology: Stage IV non-small cell lung cancer, adenocarcinoma. Primary Tumor location: Right upper lobe. Surgeries: No Chemotherapy: Yes; Ongoing? Yes; Most recent administration: 06/22/2020 Immunotherapy?  Yes; Type: Keytruda; Ongoing? Yes Radiation therapy? No EXAM: CT CHEST, ABDOMEN, AND PELVIS WITH CONTRAST TECHNIQUE: Multidetector CT imaging of the chest, abdomen and pelvis was performed following the standard protocol during bolus administration of intravenous contrast. CONTRAST:  173mL OMNIPAQUE IOHEXOL 300 MG/ML SOLN, additional oral enteric contrast COMPARISON:  Most recent CT chest, abdomen and pelvis 05/08/2020. 12/09/2019 PET-CT. FINDINGS: CT CHEST FINDINGS Cardiovascular:  Right chest port catheter. Aortic atherosclerosis. Normal heart size. Three-vessel coronary artery calcifications and/or stents. No pericardial effusion. Mediastinum/Nodes: Unchanged enlarged, matted pretracheal lymph nodes and subcarinal and right hilar soft tissue thickening, nodes measuring up to 1.7 x 1.3 cm (series 2, image 19). Thyroid gland, trachea, and esophagus demonstrate no significant findings. Lungs/Pleura: No significant interval change in a mass of the posterior right upper lobe abutting the major fissure measuring approximately 3.7 x 3.1 cm (series 6, image 53). Unchanged fissural nodularity of the posterior right middle lobe along the major fissure, measuring up to 6 mm (series 6, image 74). Middle lobe no pleural effusion or pneumothorax. Musculoskeletal: No chest wall mass or  suspicious bone lesions identified. CT ABDOMEN PELVIS FINDINGS Hepatobiliary: Hepatic steatosis. No gallstones, gallbladder wall thickening, or biliary dilatation. Pancreas: Unremarkable. No pancreatic ductal dilatation or surrounding inflammatory changes. Spleen: Normal in size without significant abnormality. Adrenals/Urinary Tract: No significant interval change in a nodule of the right adrenal gland measuring 2.5 x 1.9 cm (series 2, image 52). No significant interval change in a nodule of the left adrenal gland measuring 1.8 x 1.3 cm (series 2, image 55). Nonobstructive calculus of the midportion of the left kidney. No hydronephrosis. Thickening of the urinary bladder, likely secondary to chronic outlet obstruction. Stomach/Bowel: Stomach is within normal limits. Appendix appears normal. No evidence of bowel wall thickening, distention, or inflammatory changes. Vascular/Lymphatic: Aortic atherosclerosis. No enlarged abdominal or pelvic lymph nodes. Reproductive: No mass or other abnormality. Other: No abdominal wall hernia or abnormality. No abdominopelvic ascites. Musculoskeletal: No acute or significant osseous findings. IMPRESSION: 1. No significant interval change in a mass of the posterior right upper lobe abutting the major fissure. 2. Unchanged enlarged, matted pretracheal lymph nodes and post treatment subcarinal and right hilar soft tissue thickening. 3. Unchanged fissural nodularity of the posterior right middle lobe along the major fissure, nonspecific, although metastatic disease is not excluded. Attention on follow-up. 4. No significant change in adrenal gland nodules, very likely benign adenomata without significant FDG avidity on prior PET-CT. Attention on follow-up. 5. No evidence of metastatic disease in the abdomen or pelvis. 6. Hepatic steatosis. 7. Coronary artery disease.  Aortic Atherosclerosis (ICD10-I70.0). Electronically Signed   By: Eddie Candle M.D.   On: 07/08/2020 10:03      ASSESSMENT/PLAN:  This is a very pleasant 64 year old African-American male diagnosed with stage IV non-small cell lung cancer, adenocarcinoma. He presented with a large right upper lobe lung mass with right hilar, subcarinal, paratracheal lymphadenopathy,and right supraclavicular lymphadenopathy. He also presented with bilateral pulmonary nodules. He was diagnosed in February 2021. He does not have any actionable mutations.  The patient is currently undergoing palliative systemic chemotherapy with carboplatin for an AUC of 5, Alimta 500 mg/m, Keytruda 200 mg IV every 3 weeks. He is status post10cyclesand he tolerated it well without any adverse side effects. Starting from cycle #5, the patient has been on maintenance alimta and [place].  The patient was seen with Dr. Julien Nordmann. Labs were reviewed. Recommend that he proceed with cycle #11today as scheduled.   Regarding the significant bilateral upper extremity swelling, we will arrange for the patient to have a bilateral Doppler ultrasound of his upper extremity.  We will also arrange for the patient to have an echocardiogram performed to rule out any underlying heart disease.   Dr. Earlie Server also recommends that we put the patient on 40 mEq of Lasix daily for the next 10 days or so.  We will also send 20  mEq of potassium chloride to his pharmacy to prevent hypokalemia.  We will see him back for a follow up visit in 1 weeks for evaluation and to check on his swelling.  The patient was advised to call immediately if he has any concerning symptoms in the interval. The patient voices understanding of current disease status and treatment options and is in agreement with the current care plan. All questions were answered. The patient knows to call the clinic with any problems, questions or concerns. We can certainly see the patient much sooner if necessary   Orders Placed This Encounter  Procedures  . ECHOCARDIOGRAM COMPLETE     Standing Status:   Future    Standing Expiration Date:   08/03/2021    Order Specific Question:   Where should this test be performed    Answer:   [facility])    Order Specific Question:   Does the patient weigh less than or greater than 250 lbs?    Answer:   Patient weighs greater than 250 lbs    Order Specific Question:   Perflutren DEFINITY (image enhancing agent) should be administered unless hypersensitivity or allergy exist    Answer:   Administer Perflutren    Order Specific Question:   Is a special reader required? (athlete or structural heart)    Answer:   No    Order Specific Question:   Does this study need to be read by the Structural team/Level 3 readers?    Answer:   No    Order Specific Question:   Reason for exam-Echo    Answer:   Other-Full Diagnosis List    Order Specific Question:   Full ICD-10/Reason for Exam    Answer:   Swelling [[id number]]     Kevin Fooks L Carly Sabo, Kevin Lambert 08/03/20  ADDENDUM: Hematology/Oncology Attending: I had a face-to-face encounter with the patient today.  I recommended his care plan.  This is a very pleasant 64 years old African-American male with a stage IV non-small cell lung cancer, adenocarcinoma status post induction systemic chemotherapy with carboplatin, Alimta and Keytruda with partial response and he is currently on maintenance treatment with Alimta and Keytruda every 3 weeks status post 10 cycles.  He has been tolerating this treatment well. He presented today with significant swelling of the upper and lower extremities.  The patient denied having any cardiac or hepatic issues. Blood work today is unremarkable except for the mild anemia and mild renal insufficiency. I recommended for the patient to proceed with cycle #11 today as planned. Regarding the edema of the upper and lower extremities, we will start the patient on Lasix 40 mg p.o. daily for the next 10 days with potassium supplements. We will also order  Doppler of the upper extremities to rule out deep venous thrombosis. We will check a cardiac 2D echo to rule out any underlying congestive heart failure. The patient will come back for follow-up visit in 1 week for reevaluation and discussion of his studies as well as further recommendation regarding his condition. He was advised to call immediately or go to the emergency department if he has any worsening of his condition.  Disclaimer: This note was dictated with voice recognition software. Similar sounding words can inadvertently be transcribed and may be missed upon review. Eilleen Kempf, MD 08/03/20

## 2020-08-03 ENCOUNTER — Inpatient Hospital Stay: Payer: No Typology Code available for payment source

## 2020-08-03 ENCOUNTER — Encounter: Payer: Self-pay | Admitting: Physician Assistant

## 2020-08-03 ENCOUNTER — Inpatient Hospital Stay (HOSPITAL_BASED_OUTPATIENT_CLINIC_OR_DEPARTMENT_OTHER): Payer: No Typology Code available for payment source | Admitting: Physician Assistant

## 2020-08-03 ENCOUNTER — Other Ambulatory Visit: Payer: Self-pay

## 2020-08-03 VITALS — BP 152/66 | HR 75 | Temp 97.1°F | Resp 18 | Ht 69.0 in | Wt 253.9 lb

## 2020-08-03 DIAGNOSIS — Z5111 Encounter for antineoplastic chemotherapy: Secondary | ICD-10-CM

## 2020-08-03 DIAGNOSIS — C3491 Malignant neoplasm of unspecified part of right bronchus or lung: Secondary | ICD-10-CM

## 2020-08-03 DIAGNOSIS — E876 Hypokalemia: Secondary | ICD-10-CM | POA: Diagnosis not present

## 2020-08-03 DIAGNOSIS — Z5112 Encounter for antineoplastic immunotherapy: Secondary | ICD-10-CM | POA: Diagnosis not present

## 2020-08-03 DIAGNOSIS — Z95828 Presence of other vascular implants and grafts: Secondary | ICD-10-CM

## 2020-08-03 DIAGNOSIS — M7989 Other specified soft tissue disorders: Secondary | ICD-10-CM | POA: Insufficient documentation

## 2020-08-03 LAB — CMP (CANCER CENTER ONLY)
ALT: 71 U/L — ABNORMAL HIGH (ref 0–44)
AST: 33 U/L (ref 15–41)
Albumin: 3 g/dL — ABNORMAL LOW (ref 3.5–5.0)
Alkaline Phosphatase: 71 U/L (ref 38–126)
Anion gap: 7 (ref 5–15)
BUN: 17 mg/dL (ref 8–23)
CO2: 28 mmol/L (ref 22–32)
Calcium: 10 mg/dL (ref 8.9–10.3)
Chloride: 103 mmol/L (ref 98–111)
Creatinine: 1.38 mg/dL — ABNORMAL HIGH (ref 0.61–1.24)
GFR, Est AFR Am: >60 mL/min (ref >60)
GFR, Estimated: 54 mL/min — ABNORMAL LOW (ref >60)
Glucose, Bld: 90 mg/dL (ref 70–99)
Potassium: 3.9 mmol/L (ref 3.5–5.1)
Sodium: 138 mmol/L (ref 135–145)
Total Bilirubin: 0.2 mg/dL — ABNORMAL LOW (ref 0.3–1.2)
Total Protein: 6.8 g/dL (ref 6.5–8.1)

## 2020-08-03 LAB — CBC WITH DIFFERENTIAL (CANCER CENTER ONLY)
Abs Immature Granulocytes: 0.03 10*3/uL (ref 0.00–0.07)
Basophils Absolute: 0 10*3/uL (ref 0.0–0.1)
Basophils Relative: 0 %
Eosinophils Absolute: 0.1 10*3/uL (ref 0.0–0.5)
Eosinophils Relative: 2 %
HCT: 30.3 % — ABNORMAL LOW (ref 39.0–52.0)
Hemoglobin: 10.1 g/dL — ABNORMAL LOW (ref 13.0–17.0)
Immature Granulocytes: 0 %
Lymphocytes Relative: 27 %
Lymphs Abs: 1.9 10*3/uL (ref 0.7–4.0)
MCH: 34.8 pg — ABNORMAL HIGH (ref 26.0–34.0)
MCHC: 33.3 g/dL (ref 30.0–36.0)
MCV: 104.5 fL — ABNORMAL HIGH (ref 80.0–100.0)
Monocytes Absolute: 1 10*3/uL (ref 0.1–1.0)
Monocytes Relative: 15 %
Neutro Abs: 3.8 10*3/uL (ref 1.7–7.7)
Neutrophils Relative %: 56 %
Platelet Count: 366 10*3/uL (ref 150–400)
RBC: 2.9 MIL/uL — ABNORMAL LOW (ref 4.22–5.81)
RDW: 17.9 % — ABNORMAL HIGH (ref 11.5–15.5)
WBC Count: 6.9 10*3/uL (ref 4.0–10.5)
nRBC: 0 % (ref 0.0–0.2)

## 2020-08-03 LAB — TSH: TSH: 0.923 u[IU]/mL (ref 0.320–4.118)

## 2020-08-03 MED ORDER — SODIUM CHLORIDE 0.9 % IV SOLN
Freq: Once | INTRAVENOUS | Status: AC
  Filled 2020-08-03: qty 250

## 2020-08-03 MED ORDER — SODIUM CHLORIDE 0.9% FLUSH
10.0000 mL | INTRAVENOUS | Status: DC | PRN
  Administered 2020-08-03: 10 mL
  Filled 2020-08-03: qty 10

## 2020-08-03 MED ORDER — POTASSIUM CHLORIDE CRYS ER 20 MEQ PO TBCR: 20.0000 meq | EXTENDED_RELEASE_TABLET | Freq: Every day | ORAL | 0 refills | Status: DC

## 2020-08-03 MED ORDER — SODIUM CHLORIDE 0.9% FLUSH
10.0000 mL | Freq: Once | INTRAVENOUS | Status: AC
  Administered 2020-08-03: 10 mL
  Filled 2020-08-03: qty 10

## 2020-08-03 MED ORDER — SODIUM CHLORIDE 0.9 % IV SOLN
200.0000 mg | Freq: Once | INTRAVENOUS | Status: AC
  Administered 2020-08-03: 200 mg via INTRAVENOUS
  Filled 2020-08-03: qty 8

## 2020-08-03 MED ORDER — HEPARIN SOD (PORK) LOCK FLUSH 100 UNIT/ML IV SOLN
500.0000 [IU] | Freq: Once | INTRAVENOUS | Status: AC | PRN
  Administered 2020-08-03: 500 [IU]
  Filled 2020-08-03: qty 5

## 2020-08-03 MED ORDER — FUROSEMIDE 20 MG PO TABS: 20.0000 mg | ORAL_TABLET | Freq: Two times a day (BID) | ORAL | 0 refills | Status: DC

## 2020-08-03 MED ORDER — PROCHLORPERAZINE MALEATE 10 MG PO TABS
10.0000 mg | ORAL_TABLET | Freq: Once | ORAL | Status: AC
  Administered 2020-08-03: 10 mg via ORAL

## 2020-08-03 MED ORDER — SODIUM CHLORIDE 0.9 % IV SOLN
500.0000 mg/m2 | Freq: Once | INTRAVENOUS | Status: AC
  Administered 2020-08-03: 1200 mg via INTRAVENOUS
  Filled 2020-08-03: qty 40

## 2020-08-03 MED ORDER — PROCHLORPERAZINE MALEATE 10 MG PO TABS
ORAL_TABLET | ORAL | Status: AC
  Filled 2020-08-03: qty 1

## 2020-08-03 NOTE — Patient Instructions (Signed)
[facility] Discharge Instructions for Patients Receiving Chemotherapy  Today you received the following chemotherapy agents Keytruda and Alimta.   To help prevent nausea and vomiting after your treatment, we encourage you to take your nausea medication as directed  If you develop nausea and vomiting that is not controlled by your nausea medication, call the clinic.   BELOW ARE SYMPTOMS THAT SHOULD BE REPORTED IMMEDIATELY:  *FEVER GREATER THAN 100.5 F  *CHILLS WITH OR WITHOUT FEVER  NAUSEA AND VOMITING THAT IS NOT CONTROLLED WITH YOUR NAUSEA MEDICATION  *UNUSUAL SHORTNESS OF BREATH  *UNUSUAL BRUISING OR BLEEDING  TENDERNESS IN MOUTH AND THROAT WITH OR WITHOUT PRESENCE OF ULCERS  *URINARY PROBLEMS  *BOWEL PROBLEMS  UNUSUAL RASH Items with * indicate a potential emergency and should be followed up as soon as possible.  Feel free to call the clinic you have any questions or concerns. The clinic phone number is [phone].  Please show the [facility] [place] at check-in to the Emergency Department and triage nurse.

## 2020-08-03 NOTE — Patient Instructions (Signed)

## 2020-08-06 ENCOUNTER — Ambulatory Visit (HOSPITAL_BASED_OUTPATIENT_CLINIC_OR_DEPARTMENT_OTHER): Payer: No Typology Code available for payment source

## 2020-08-06 ENCOUNTER — Other Ambulatory Visit: Payer: Self-pay

## 2020-08-06 ENCOUNTER — Telehealth: Payer: Self-pay | Admitting: Physician Assistant

## 2020-08-06 DIAGNOSIS — R600 Localized edema: Secondary | ICD-10-CM | POA: Diagnosis not present

## 2020-08-06 DIAGNOSIS — Z5112 Encounter for antineoplastic immunotherapy: Secondary | ICD-10-CM | POA: Diagnosis not present

## 2020-08-06 DIAGNOSIS — M7989 Other specified soft tissue disorders: Secondary | ICD-10-CM

## 2020-08-06 LAB — ECHOCARDIOGRAM COMPLETE
Area-P 1/2: 3.08 cm2
S' Lateral: 2.5 cm

## 2020-08-06 NOTE — Telephone Encounter (Signed)
Scheduled per los. Called and left msg.

## 2020-08-07 ENCOUNTER — Other Ambulatory Visit: Payer: Self-pay | Admitting: Physician Assistant

## 2020-08-07 ENCOUNTER — Ambulatory Visit (HOSPITAL_COMMUNITY)
Admission: RE | Admit: 2020-08-07 | Discharge: 2020-08-07 | Disposition: A | Discharge status: Home / Self Care | Payer: No Typology Code available for payment source | Source: Ambulatory Visit | Attending: Cardiovascular Disease | Admitting: Cardiovascular Disease

## 2020-08-07 DIAGNOSIS — M7989 Other specified soft tissue disorders: Secondary | ICD-10-CM | POA: Diagnosis not present

## 2020-08-07 NOTE — Progress Notes (Signed)
[facility] OFFICE PROGRESS NOTE  [name], MD [address]  [place] [address]  DIAGNOSIS: DIAGNOSIS: Stage IV (T2b, N3, M1a) non-small cell lung cancer, adenocarcinoma presented with large right upper lobe lung mass in addition to mediastinal and right supraclavicular lymphadenopathy as well as bilateral pulmonary nodules diagnosed in February 2021.  MOLECULAR STUDY by [employer]:  KRASG12C,4.3%,Binimetinib  [id number],0.9%,Niraparib, Olaparib, Rucaparib,Talazoparib, Tazemetostat  [id number],2.2%,GURK  PRIOR THERAPY: None  CURRENT THERAPY: Systemic chemotherapy with carboplatin for AUC of 5, Alimta 500 mg/M2 and Keytruda 200 mg IV every 3 weeks. First dose December 31, 2019.Status post10cycles. Starting from cycle #5, he will be on maintenance Alimta and Keytruda.  INTERVAL HISTORY: Kevin Lambert. 64 y.o. male returns to the clinic today for a follow-up visit.  The patient is feeling fair today without any concerning complaints except for persistent bilateral upper extremity swelling (R>L).  This had started several months ago but had gotten worse over the course of the last 3 to 4 weeks or so.  He was supposed to start cycle #11 of his last week but it was delayed until this week until we can further evaluate his upper extremity swelling/lower extremity edema.  He had an echocardiogram performed as well as a bilateral upper extremity Doppler ultrasound.  He previously had had a Doppler ultrasound of the lower extremities performed which is negative for DVT.  The patient denies any known cardiac history except for hypertension.  He is currently on HCTZ as well as Norvasc for hypertension. He does not see cardiology. He is seen at the [facility] by his PCP. Last week, we prescribed him 40 meq of lasix and 20 meq daily of potassium. The patient never received this prescription from the [place] as of Friday 08/07/20.  In the interval since his appointment last  week, the patient was also seen in the emergency room on 08/08/2020.  The patient states that he woke up at 3 in the morning with a nosebleed from his right nostril.  The patient states that it was "bleeding pretty good" but lasted for approximately 1 minute or so.  The patient denies any provoking factors.  He denies any history of nosebleeds.  The patient does take a baby aspirin.  Patient then had vomited bright red blood.  He estimates that this was approximately 3 to 4 ounces.  He went to the emergency room but left before he could be seen.  Due to the patient's bilateral upper extremity edema, the patient did not want to have lab work performed as it was very painful to be stuck with the peripheral edema.  He has not had any more episodes of nosebleeds or throwing up blood since 08/08/20.  The patient states that he had one episode of diarrhea/stomach cramping during the course of this event and states it was a "tan" color. Denies melena. He denies any chest pain.   Otherwise,  he denies any night sweats or weight loss.  He denies any fevers.  He denies any chest pain or hemoptysis but reports his baseline dyspnea on exertion.  He denies any cough.  The patient is currently trying to quit smoking cigars.  He denies any headache or visual changes.  The patient is here today for evaluation before starting cycle #11.    MEDICAL HISTORY: Past Medical History:  Diagnosis Date  . Arthritis   . Depression   . DM (diabetes mellitus) ([place])    type II  . Dyslipidemia   .  Dyspnea    unable to walk much - he thinks it is from   . GERD (gastroesophageal reflux disease)   . Hepatitis    Hepatitis C- treated  . History of kidney stones   . HTN (hypertension)   . Neuropathy   . nscl ca dx'd 10/2019  . OSA on CPAP   . PTSD (post-traumatic stress disorder)     ALLERGIES:  is allergic to metformin and related.  MEDICATIONS:  Current Outpatient Medications  Medication Sig Dispense Refill  . albuterol  (VENTOLIN HFA) 108 (90 Base) MCG/ACT inhaler Inhale 2 puffs into the lungs every 4 (four) hours as needed for wheezing or shortness of breath. 18 g 3  . amLODipine (NORVASC) 10 MG tablet Take 10 mg by mouth daily.    . Ascorbic Acid (VITAMIN C) 1000 MG tablet Take 1,000 mg by mouth daily.    Marland Kitchen aspirin EC 81 MG tablet Take 81 mg by mouth daily.    Marland Kitchen atorvastatin (LIPITOR) 10 MG tablet Take 10 mg by mouth daily.    . bisacodyl (DULCOLAX) 5 MG EC tablet Take 5 mg by mouth daily as needed for moderate constipation.    . cholecalciferol (VITAMIN D3) 25 MCG (1000 UNIT) tablet Take 1,000 Units by mouth daily.    . Cyanocobalamin (VITAMIN B 12 PO) Take 1 tablet by mouth daily.    . folic acid (FOLVITE) 1 MG tablet Take 1 tablet (1 mg total) by mouth daily. Pt needs to pick up today and start ASAP . He will pay out of pocket. He is waiting on the [place] to send his folic acid which will be next week. (Patient taking differently: Take 1 mg by mouth daily. ) 7 tablet 0  . furosemide (LASIX) 20 MG tablet Take 1 tablet (20 mg total) by mouth 2 (two) times daily. 20 tablet 0  . glipiZIDE (GLUCOTROL) 5 MG tablet Take 5 mg by mouth daily before breakfast.     . lidocaine-prilocaine (EMLA) cream Apply to Port-A-Cath site 30-60-minute before treatment. 30 g 0  . magic mouthwash SOLN Take 5 mLs by mouth 4 (four) times daily as needed for mouth pain. Swish and swallow or spit 240 mL 2  . naproxen (NAPROSYN) 500 MG tablet Take 500 mg by mouth 2 (two) times daily with a meal.     . nicotine (NICODERM CQ) 21 mg/24hr patch Place 1 patch (21 mg total) onto the skin daily. 28 patch 3  . nicotine polacrilex (COMMIT) 4 MG lozenge Take 1 lozenge (4 mg total) by mouth as needed for smoking cessation. 108 tablet 3  . potassium chloride SA (KLOR-CON) 20 MEQ tablet Take 1 tablet (20 mEq total) by mouth daily. 10 tablet 0  . pregabalin (LYRICA) 25 MG capsule Take 1 capsule (25 mg total) by mouth daily. 30 capsule 1  . Propylene Glycol  (SYSTANE BALANCE OP) Place 1 drop into both eyes daily.    Marland Kitchen triamterene-hydrochlorothiazide (MAXZIDE-25) 37.5-25 MG tablet Take 1 tablet by mouth daily.     Marland Kitchen umeclidinium-vilanterol (ANORO ELLIPTA) 62.5-25 MCG/INH AEPB Inhale 1 puff into the lungs daily. 60 each 11  . umeclidinium-vilanterol (ANORO ELLIPTA) 62.5-25 MCG/INH AEPB Inhale 1 puff into the lungs daily. 14 each 0  . zinc gluconate 50 MG tablet Take 50 mg by mouth daily.     No current facility-administered medications for this visit.    SURGICAL HISTORY:  Past Surgical History:  Procedure Laterality Date  . BIOPSY OF MEDIASTINAL MASS  12/10/2019   Procedure: BIOPSY OF MEDIASTINAL MASS;  Surgeon: Garner Nash, DO;  Location: [facility];  Service: Pulmonary;;  right upper lobe  . BRONCHIAL NEEDLE ASPIRATION BIOPSY  12/10/2019   Procedure: BRONCHIAL NEEDLE ASPIRATION BIOPSIES;  Surgeon: Garner Nash, DO;  Location: [facility];  Service: Pulmonary;;  . COLONOSCOPY    . IR IMAGING GUIDED PORT INSERTION  12/31/2019  . kidney stone    . URETERAL STENT PLACEMENT    . Ureteral Stent Removed    . VIDEO BRONCHOSCOPY WITH ENDOBRONCHIAL ULTRASOUND N/A 12/10/2019   Procedure: VIDEO BRONCHOSCOPY WITH ENDOBRONCHIAL ULTRASOUND;  Surgeon: Garner Nash, DO;  Location: [facility];  Service: Pulmonary;  Laterality: N/A;    REVIEW OF SYSTEMS:   Review of Systems  Constitutional:Positive for fatigue.Negative for appetite change, chills, fever and unexpected weight change.  HENT:Positive for teary eyes and nosebleed x1 (right sided).Negative for mouth sores, sore throat and trouble swallowing.  Eyes: Negative for eye problems and icterus.  Respiratory:Positive for baseline shortness of breath with exertion.Negative for cough, hemoptysis, and wheezing.  Cardiovascular:Positive for bilateral upper extremity swelling.  Positive for some bilateral lower extremity swelling.Negative for chest pain. Gastrointestinal: Positive for  hematemesis x1 and diarrhea x1 on 08/08/20. Negative for constipation and nausea. Genitourinary: Negative for bladder incontinence, difficulty urinating, dysuria, frequency and hematuria.  Musculoskeletal: Negative for back pain, gait problem, neck pain and neck stiffness.  Skin:Positive for darkening of the skin in the left lower extremity with associated dry skin. Neurological: Negative for dizziness, extremity weakness, gait problem, headaches, light-headedness and seizures.  Hematological: Negative for adenopathy. Does not bruise/bleed easily.  Psychiatric/Behavioral: Negative for confusion, depression and sleep disturbance. The patient is not nervous/anxious.   PHYSICAL EXAMINATION:  Blood pressure 125/63, pulse 88, temperature (!) 97.2 F (36.2 C), temperature source Tympanic, resp. rate 18, height 5\' 9"  (1.753 m), weight 244 lb 3.2 oz (110.8 kg), SpO2 99 %.  ECOG PERFORMANCE STATUS: 1 - Symptomatic but completely ambulatory  Physical Exam  Constitutional: Oriented to person, place, and time and well-developed, well-nourished, and in no distress.  HENT:  Head: Normocephalic and atraumatic.  Mouth/Throat: Oropharynx is clear and moist. No oropharyngeal exudate.  Eyes:Positive for tearing eyes.Conjunctivae are normal. Right eye exhibits no discharge. Left eye exhibits no discharge. No scleral icterus.  Neck: Normal range of motion. Neck supple.  Cardiovascular: Normal rate, regular rhythm, normal heart sounds and intact distal pulses.  Pulmonary/Chest: Effort normal and breath sounds normal. No respiratory distress. No wheezes. No rales.  Abdominal: Soft. Bowel sounds are normal. Exhibits no distension and no mass. There is no tenderness.  Musculoskeletal: Positive for bilateral upper extremity swelling (R>L).  Positive for some bilateral lower extremity swelling. normal range of motion.  Lymphadenopathy:  No cervical adenopathy.  Neurological: Alert and oriented to person,  place, and time. Exhibits normal muscle tone. Gait normal. Coordination normal.  Skin:Positive for darkening of the skin in the left lower extremity with associated dry skin.Not diaphoretic. No erythema. No pallor.  Psychiatric: Mood, memory and judgment normal.  Vitals reviewed.  LABORATORY DATA: Lab Results  Component Value Date   WBC 3.1 (L) 08/10/2020   HGB 9.7 (L) 08/10/2020   HCT 28.1 (L) 08/10/2020   MCV 101.1 (H) 08/10/2020   PLT 174 08/10/2020      Chemistry      Component Value Date/Time   NA 134 (L) 08/10/2020 1012   K 3.7 08/10/2020 1012   CL 101 08/10/2020 1012   CO2 26  08/10/2020 1012   BUN 12 08/10/2020 1012   CREATININE 1.47 (H) 08/10/2020 1012      Component Value Date/Time   CALCIUM 9.7 08/10/2020 1012   ALKPHOS 67 08/10/2020 1012   AST 44 (H) 08/10/2020 1012   ALT 54 (H) 08/10/2020 1012   BILITOT 0.7 08/10/2020 1012       RADIOGRAPHIC STUDIES:  ECHOCARDIOGRAM COMPLETE  Result Date: 08/06/2020    ECHOCARDIOGRAM REPORT   Patient Name:   Skyeler Smola. Date of Exam: 08/06/2020 Medical Rec #:  [id number]       Height:       69.0 in Accession #:    [id number]      Weight:       253.9 lb Date of Birth:  April 28, 1956       BSA:          2.287 m Patient Age:    61 years        BP:           152/66 mmHg Patient Gender: M               HR:           87 bpm. Exam Location:  [facility] Procedure: 2D Echo, Cardiac Doppler and Color Doppler Indications:    M79.89 Swelling of both upper extremities  History:        Patient has no prior history of Echocardiogram examinations.                 Risk Factors:Hypertension, Diabetes and Current Smoker. Dyspnea                 on exertion. Non-small cell cancer of right lung.  Sonographer:    Diamond Nickel RCS Referring Phys: [id number] [place]  1. Left ventricular ejection fraction, by estimation, is 55%. The left ventricle has normal function. The left ventricle has no regional wall motion  abnormalities. There is moderate left ventricular hypertrophy. Left ventricular diastolic parameters are  consistent with Grade I diastolic dysfunction (impaired relaxation).  2. Right ventricular systolic function is normal. The right ventricular size is normal. Tricuspid regurgitation signal is inadequate for assessing PA pressure.  3. The mitral valve is normal in structure. No evidence of mitral valve regurgitation. No evidence of mitral stenosis.  4. The aortic valve is tricuspid. Aortic valve regurgitation is not visualized. Mild aortic valve sclerosis is present, with no evidence of aortic valve stenosis.  5. Aortic dilatation noted. There is mild dilatation of the aortic root, measuring 38 mm.  6. The inferior vena cava is normal in size with greater than 50% respiratory variability, suggesting right atrial pressure of 3 mmHg. FINDINGS  Left Ventricle: Left ventricular ejection fraction, by estimation, is 55%. The left ventricle has normal function. The left ventricle has no regional wall motion abnormalities. The left ventricular internal cavity size was normal in size. There is moderate left ventricular hypertrophy. Left ventricular diastolic parameters are consistent with Grade I diastolic dysfunction (impaired relaxation). Right Ventricle: The right ventricular size is normal. No increase in right ventricular wall thickness. Right ventricular systolic function is normal. Tricuspid regurgitation signal is inadequate for assessing PA pressure. Left Atrium: Left atrial size was normal in size. Right Atrium: Right atrial size was normal in size. Pericardium: There is no evidence of pericardial effusion. Mitral Valve: The mitral valve is normal in structure. No evidence of mitral valve regurgitation. No evidence of mitral valve stenosis. Tricuspid Valve: The  tricuspid valve is normal in structure. Tricuspid valve regurgitation is not demonstrated. Aortic Valve: The aortic valve is tricuspid. Aortic valve  regurgitation is not visualized. Mild aortic valve sclerosis is present, with no evidence of aortic valve stenosis. Pulmonic Valve: The pulmonic valve was normal in structure. Pulmonic valve regurgitation is not visualized. Aorta: Aortic dilatation noted. There is mild dilatation of the aortic root, measuring 38 mm. Venous: The inferior vena cava is normal in size with greater than 50% respiratory variability, suggesting right atrial pressure of 3 mmHg. IAS/Shunts: No atrial level shunt detected by color flow Doppler.  LEFT VENTRICLE PLAX 2D LVIDd:         4.30 cm  Diastology LVIDs:         2.50 cm  LV e' medial:    7.07 cm/s LV PW:         1.70 cm  LV E/e' medial:  8.4 LV IVS:        1.90 cm  LV e' lateral:   10.00 cm/s LVOT diam:     2.30 cm  LV E/e' lateral: 5.9 LV SV:         93 LV SV Index:   41 LVOT Area:     4.15 cm  RIGHT VENTRICLE RV Basal diam:  3.00 cm RV S prime:     14.40 cm/s TAPSE (M-mode): 2.6 cm LEFT ATRIUM             Index       RIGHT ATRIUM           Index LA diam:        3.60 cm 1.57 cm/m  RA Area:     15.30 cm LA Vol (A2C):   85.2 ml 37.24 ml/m RA Volume:   37.70 ml  16.49 ml/m LA Vol (A4C):   44.2 ml 19.33 ml/m LA Biplane Vol: 61.1 ml 26.72 ml/m  AORTIC VALVE LVOT Vmax:   133.00 cm/s LVOT Vmean:  72.100 cm/s LVOT VTI:    0.225 m  AORTA Ao Root diam: 4.00 cm MITRAL VALVE MV Area (PHT): 3.08 cm    SHUNTS MV Decel Time: 246 msec    Systemic VTI:  0.22 m MV E velocity: 59.10 cm/s  Systemic Diam: 2.30 cm MV A velocity: 97.30 cm/s MV E/A ratio:  0.61 Loralie Champagne MD Electronically signed by Loralie Champagne MD Signature Date/Time: 08/06/2020/5:00:45 PM    Final    VAS [place] UPPER EXTREMITY VENOUS DUPLEX  Result Date: 08/07/2020 UPPER VENOUS STUDY  Indications: Swelling, Edema, and Bilateral arms and hands have been swollen for about 2 months. Patient denies SOB or chest pains. Risk Factors: Cancer Lung. Comparison Study: None Performing Technologist: Alecia Mackin RVT, RDCS (AE), RDMS   Examination Guidelines: A complete evaluation includes B-mode imaging, spectral Doppler, color Doppler, and power Doppler as needed of all accessible portions of each vessel. Bilateral testing is considered an integral part of a complete examination. Limited examinations for reoccurring indications may be performed as noted.  Right Findings: +----------+------------+---------+-----------+----------+-------+ RIGHT     CompressiblePhasicitySpontaneousPropertiesSummary +----------+------------+---------+-----------+----------+-------+ IJV           Full       Yes       Yes                      +----------+------------+---------+-----------+----------+-------+ Subclavian               Yes       Yes                      +----------+------------+---------+-----------+----------+-------+  Axillary      Full       Yes       Yes                      +----------+------------+---------+-----------+----------+-------+ Brachial      Full       Yes       Yes                      +----------+------------+---------+-----------+----------+-------+ Radial        Full       Yes       Yes                      +----------+------------+---------+-----------+----------+-------+ Ulnar         Full       Yes       Yes                      +----------+------------+---------+-----------+----------+-------+ Cephalic      Full                                          +----------+------------+---------+-----------+----------+-------+ Basilic       Full                                          +----------+------------+---------+-----------+----------+-------+  Left Findings: +----------+------------+---------+-----------+----------+-------+ LEFT      CompressiblePhasicitySpontaneousPropertiesSummary +----------+------------+---------+-----------+----------+-------+ IJV           Full       Yes       Yes                       +----------+------------+---------+-----------+----------+-------+ Subclavian               Yes       Yes                      +----------+------------+---------+-----------+----------+-------+ Axillary      Full       Yes       Yes                      +----------+------------+---------+-----------+----------+-------+ Brachial      Full       Yes       Yes                      +----------+------------+---------+-----------+----------+-------+ Radial        Full       Yes       Yes                      +----------+------------+---------+-----------+----------+-------+ Ulnar         Full       Yes       Yes                      +----------+------------+---------+-----------+----------+-------+ Cephalic      Full                                          +----------+------------+---------+-----------+----------+-------+  Basilic       Full                                          +----------+------------+---------+-----------+----------+-------+  Findings reported to [place] email through EPIC at 2:45 pm.  Summary: No evidence of deep vein or superficial vein thrombosis involving the right and left upper extremities. Right: No evidence of superficial vein thrombosis in the upper extremity. No evidence of thrombosis in the subclavian.  Left: No evidence of superficial vein thrombosis in the upper extremity. No evidence of thrombosis in the subclavian.  Bilateral superficial edema noted in both forearms and hands, right > left. *See table(s) above for measurements and observations.  Diagnosing physician: Larae Grooms MD Electronically signed by Larae Grooms MD on 08/07/2020 at 69:08:58 PM.    Final      ASSESSMENT/PLAN:  This is a very pleasant 64 year old African-American male diagnosed with stage IV non-small cell lung cancer, adenocarcinoma. He presented with a large right upper lobe lung mass with right hilar, subcarinal, paratracheal  lymphadenopathy,and right supraclavicular lymphadenopathy. He also presented with bilateral pulmonary nodules. He was diagnosed in February 2021. He does not have any actionable mutations.  The patient is currently undergoing palliative systemic chemotherapy with carboplatin for an AUC of 5, Alimta 500 mg/m, Keytruda 200 mg IV every 3 weeks. He is status post10cyclesand he tolerated it well without any adverse side effects. Starting from cycle #5, the patient has been on maintenance alimta and [place].  The patient upper extremity Doppler ultrasound as well as echocardiogram were reviewed.  Upper extremity ultrasound was negative for DVT but showed bilateral superficial edema.  The patient's echocardiogram showed some diastolic dysfunction with an ejection fraction of 55%.   The patient was seen with Dr. Julien Nordmann today.  Labs were reviewed.  We will continue to hold his treatment until further improvement in his swelling.  We will administer 40 mEq of Lasix via IV in the clinic today.  He was instructed to eat potassium rich food today as well.  The patient was instructed to call his pharmacy to follow-up on the prescriptions for potassium chloride and Lasix that was sent last week.   We will fax over the results from the upper extremity ultrasound as well as the echocardiogram to the patient's PCP at the [facility].  We would recommend for them to evaluate and manage the patient's current condition, as well as consider a referral to a cardiologist.  We will reschedule the patient's appointment for cycle #12 of his chemotherapy for next week.  We will reevaluate the patient at that time to determine if he should proceed with cycle #12.  Meantime, the patient was instructed to return to the emergency room in immediately if he experiences any hematemesis.  Additionally, the patient was advised to limit his intake of sodium rich foods.  The patient was advised to call immediately if he has any  concerning symptoms in the interval. The patient voices understanding of current disease status and treatment options and is in agreement with the current care plan. All questions were answered. The patient knows to call the clinic with any problems, questions or concerns. We can certainly see the patient much sooner if necessary  No orders of the defined types were placed in this encounter.    Esa Raden L Shanautica Forker, PA-C 08/10/20  ADDENDUM: Hematology/oncology Attending: I had a face-to-face  encounter with the patient today.  I recommended his care plan.  This is a very pleasant 64 years old African-American male with stage IV non-small cell lung cancer, adenocarcinoma status post induction systemic chemotherapy with carboplatin, Alimta and Keytruda for 4 cycles and he is currently on maintenance treatment with Alimta and Keytruda every 3 weeks status post total of 11 cycles. His treatment has been on hold because of significant swelling of the upper and lower extremities.  The patient was given prescription for Lasix but has not started the treatment yet awaiting the arrival of his prescription from the [facility]. He had upper extremity Doppler that showed no evidence of deep venous thrombosis.  We also arrange for the patient to have 2D echo that showed ejection fraction of 55%. I recommended for the patient to see a cardiologist at the [facility] facility for evaluation of his condition. For the swelling, we will give the patient Lasix 40 mg IV in the clinic today and he was advised to start his oral Lasix at home once he receives the prescription. For the one episode of suspicious hematemesis, the patient presented to the emergency department last week but he did not stay for his evaluation.  He did not have any recurrence of this condition. I recommended for the patient to continue to monitor it closely and go immediately to the emergency department if he has any recurrence of hematemesis.  The  patient indicated that it was likely coming from his mouth and he did swallow it. We will see him back for follow-up visit next week for evaluation before resuming his treatment. He was advised to call immediately if he has any concerning symptoms in the interval.  Disclaimer: This note was dictated with voice recognition software. Similar sounding words can inadvertently be transcribed and may be missed upon review. Eilleen Kempf, MD 08/10/20

## 2020-08-08 ENCOUNTER — Encounter (HOSPITAL_COMMUNITY): Payer: Self-pay | Admitting: Emergency Medicine

## 2020-08-08 ENCOUNTER — Other Ambulatory Visit: Payer: Self-pay

## 2020-08-08 ENCOUNTER — Emergency Department (HOSPITAL_COMMUNITY)
Admission: EM | Admit: 2020-08-08 | Discharge: 2020-08-08 | Disposition: A | Discharge status: Home / Self Care | Payer: No Typology Code available for payment source | Attending: Emergency Medicine | Admitting: Emergency Medicine

## 2020-08-08 DIAGNOSIS — K920 Hematemesis: Secondary | ICD-10-CM | POA: Insufficient documentation

## 2020-08-08 DIAGNOSIS — F17210 Nicotine dependence, cigarettes, uncomplicated: Secondary | ICD-10-CM | POA: Insufficient documentation

## 2020-08-08 DIAGNOSIS — E1169 Type 2 diabetes mellitus with other specified complication: Secondary | ICD-10-CM | POA: Insufficient documentation

## 2020-08-08 DIAGNOSIS — Z79899 Other long term (current) drug therapy: Secondary | ICD-10-CM | POA: Diagnosis not present

## 2020-08-08 DIAGNOSIS — Z85118 Personal history of other malignant neoplasm of bronchus and lung: Secondary | ICD-10-CM | POA: Insufficient documentation

## 2020-08-08 DIAGNOSIS — Z20822 Contact with and (suspected) exposure to covid-19: Secondary | ICD-10-CM | POA: Insufficient documentation

## 2020-08-08 DIAGNOSIS — Z7951 Long term (current) use of inhaled steroids: Secondary | ICD-10-CM | POA: Diagnosis not present

## 2020-08-08 DIAGNOSIS — E785 Hyperlipidemia, unspecified: Secondary | ICD-10-CM | POA: Insufficient documentation

## 2020-08-08 DIAGNOSIS — E1140 Type 2 diabetes mellitus with diabetic neuropathy, unspecified: Secondary | ICD-10-CM | POA: Diagnosis not present

## 2020-08-08 DIAGNOSIS — I10 Essential (primary) hypertension: Secondary | ICD-10-CM | POA: Insufficient documentation

## 2020-08-08 DIAGNOSIS — Z7982 Long term (current) use of aspirin: Secondary | ICD-10-CM | POA: Insufficient documentation

## 2020-08-08 DIAGNOSIS — Z7984 Long term (current) use of oral hypoglycemic drugs: Secondary | ICD-10-CM | POA: Insufficient documentation

## 2020-08-08 LAB — RESPIRATORY PANEL BY RT PCR (FLU A&B, COVID)
Influenza A by PCR: NEGATIVE
Influenza B by PCR: NEGATIVE
SARS Coronavirus 2 by RT PCR: NEGATIVE

## 2020-08-08 NOTE — ED Notes (Addendum)
Patient requested to leave. Patient upset due to nurse trying to get blood. Patient wants port accessed. Nurse told patient he needs to be in a room and that if he is getting chemo we dont like to access the port to protect you. Patient refused stick. Nurse told patient that md would be notified. Patient was left in a triage room due to him having cancer.

## 2020-08-08 NOTE — ED Provider Notes (Signed)
[facility] DEPT Provider Note   CSN: [id number] Arrival date & time: 08/08/20  0429     History Chief Complaint  Patient presents with  . Cancer  . Hematemesis    Kevin Lambert. is a 64 y.o. male.  HPI 64 year old male presents with hematemesis.  This morning around 1 hour prior to arrival he woke up and had a diarrheal bowel movement and emesis at the same time.  The diarrhea did not have blood but the emesis was about 50% blood.  It was bright red.  He has a history of lung cancer but states it was not from coughing.  He has no abdominal pain.  He uses Motrin a couple times a week for aches and pains but has not used more than typical.  He does take aspirin every day.  He drinks alcohol, has not drank in the last week or so but when he drinks he states he drinks a lot.  He is not on any blood thinners.  He does not feel ill such as having lightheadedness or dizziness.   Past Medical History:  Diagnosis Date  . Arthritis   . Depression   . DM (diabetes mellitus) ([place])    type II  . Dyslipidemia   . Dyspnea    unable to walk much - he thinks it is from   . GERD (gastroesophageal reflux disease)   . Hepatitis    Hepatitis C- treated  . History of kidney stones   . HTN (hypertension)   . Neuropathy   . nscl ca dx'd 10/2019  . OSA on CPAP   . PTSD (post-traumatic stress disorder)     Patient Active Problem List   Diagnosis Date Noted  . Swelling of upper extremity 08/03/2020  . Healthcare maintenance 05/21/2020  . DOE (dyspnea on exertion) 05/21/2020  . At risk for obstructive sleep apnea 05/21/2020  . Encounter for antineoplastic immunotherapy 04/20/2020  . Port-A-Cath in place 01/15/2020  . Encounter for antineoplastic chemotherapy 12/16/2019  . Goals of care, counseling/discussion 12/16/2019  . Mediastinal adenopathy 12/10/2019  . 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 Tobacco abuse 11/29/2019  . HTN (hypertension)  11/29/2019  . DM (diabetes mellitus) ([place]) 11/29/2019    Past Surgical History:  Procedure Laterality Date  . BIOPSY OF MEDIASTINAL MASS  12/10/2019   Procedure: BIOPSY OF MEDIASTINAL MASS;  Surgeon: Garner Nash, DO;  Location: [facility];  Service: Pulmonary;;  right upper lobe  . BRONCHIAL NEEDLE ASPIRATION BIOPSY  12/10/2019   Procedure: BRONCHIAL NEEDLE ASPIRATION BIOPSIES;  Surgeon: Garner Nash, DO;  Location: [facility];  Service: Pulmonary;;  . COLONOSCOPY    . IR IMAGING GUIDED PORT INSERTION  12/31/2019  . kidney stone    . URETERAL STENT PLACEMENT    . Ureteral Stent Removed    . VIDEO BRONCHOSCOPY WITH ENDOBRONCHIAL ULTRASOUND N/A 12/10/2019   Procedure: VIDEO BRONCHOSCOPY WITH ENDOBRONCHIAL ULTRASOUND;  Surgeon: Garner Nash, DO;  Location: [facility];  Service: Pulmonary;  Laterality: N/A;       Family History  Problem Relation Age of Onset  . Liver disease Mother   . Diabetes Father     Social History   Tobacco Use  . Smoking status: Current Every Day Smoker    Packs/day: 0.60    Years: 50.00    Pack years: 30.00    Types: Cigarettes  . Smokeless tobacco: Never Used  . Tobacco comment: Pt. stated he is  still trying to quit.  Vaping Use  . Vaping Use: Never used  Substance Use Topics  . Alcohol use: Yes    Alcohol/week: 6.0 standard drinks    Types: 6 Cans of beer per week  . Drug use: Not Currently    Home Medications Prior to Admission medications   Medication Sig Start Date End Date Taking? Authorizing Provider  albuterol (VENTOLIN HFA) 108 (90 Base) MCG/ACT inhaler Inhale 2 puffs into the lungs every 4 (four) hours as needed for wheezing or shortness of breath. 01/02/20   Icard, Leory Plowman L, DO  amLODipine (NORVASC) 10 MG tablet Take 10 mg by mouth daily.    [provider]  Ascorbic Acid (VITAMIN C) 1000 MG tablet Take 1,000 mg by mouth daily.    [provider]  aspirin EC 81 MG tablet Take 81 mg by mouth daily.     [provider]  atorvastatin (LIPITOR) 10 MG tablet Take 10 mg by mouth daily.    [provider]  bisacodyl (DULCOLAX) 5 MG EC tablet Take 5 mg by mouth daily as needed for moderate constipation.    [provider]  cholecalciferol (VITAMIN D3) 25 MCG (1000 UNIT) tablet Take 1,000 Units by mouth daily.    [provider]  Cyanocobalamin (VITAMIN B 12 PO) Take 1 tablet by mouth daily.    [provider]  folic acid (FOLVITE) 1 MG tablet Take 1 tablet (1 mg total) by mouth daily. Pt needs to pick up today and start ASAP . He will pay out of pocket. He is waiting on the [place] to send his folic acid which will be next week. Patient taking differently: Take 1 mg by mouth daily.  12/27/19   Heilingoetter, Cassandra L, PA-C  furosemide (LASIX) 20 MG tablet Take 1 tablet (20 mg total) by mouth 2 (two) times daily. 08/03/20   Heilingoetter, Cassandra L, PA-C  glipiZIDE (GLUCOTROL) 5 MG tablet Take 5 mg by mouth daily before breakfast.     [provider]  lidocaine-prilocaine (EMLA) cream Apply to Port-A-Cath site 30-60-minute before treatment. 12/25/19   Curt Bears, MD  magic mouthwash SOLN Take 5 mLs by mouth 4 (four) times daily as needed for mouth pain. Swish and swallow or spit 01/28/20   Tanner, Lyndon Code., PA-C  naproxen (NAPROSYN) 500 MG tablet Take 500 mg by mouth 2 (two) times daily with a meal.     [provider]  nicotine (NICODERM CQ) 21 mg/24hr patch Place 1 patch (21 mg total) onto the skin daily. 06/22/20   Heilingoetter, Cassandra L, PA-C  nicotine polacrilex (COMMIT) 4 MG lozenge Take 1 lozenge (4 mg total) by mouth as needed for smoking cessation. 06/22/20   Heilingoetter, Cassandra L, PA-C  potassium chloride SA (KLOR-CON) 20 MEQ tablet Take 1 tablet (20 mEq total) by mouth daily. 08/03/20   Heilingoetter, Cassandra L, PA-C  pregabalin (LYRICA) 25 MG capsule Take 1 capsule (25 mg total) by mouth daily. 01/28/20   Tanner, Lyndon Code., PA-C   Propylene Glycol (SYSTANE BALANCE OP) Place 1 drop into both eyes daily.    [provider]  triamterene-hydrochlorothiazide (MAXZIDE-25) 37.5-25 MG tablet Take 1 tablet by mouth daily.     [provider]  umeclidinium-vilanterol (ANORO ELLIPTA) 62.5-25 MCG/INH AEPB Inhale 1 puff into the lungs daily. 01/02/20   Icard, Octavio Graves, DO  umeclidinium-vilanterol (ANORO ELLIPTA) 62.5-25 MCG/INH AEPB Inhale 1 puff into the lungs daily. 05/21/20   Lauraine Rinne, NP  zinc  gluconate 50 MG tablet Take 50 mg by mouth daily.    [provider]    Allergies    Metformin and related  Review of Systems   Review of Systems  Gastrointestinal: Positive for diarrhea and vomiting. Negative for abdominal pain and blood in stool.  Neurological: Negative for light-headedness.  All other systems reviewed and are negative.   Physical Exam Updated Vital Signs BP (!) 155/66 (BP Location: Right Arm)   Pulse 86   Temp 98.6 F (37 C) (Oral)   Resp 18   Ht 5\' 9"  (1.753 m)   Wt 115.2 kg   SpO2 97%   BMI 37.49 kg/m   Physical Exam Vitals and nursing note reviewed.  Constitutional:      General: He is not in acute distress.    Appearance: He is well-developed. He is obese. He is not ill-appearing or diaphoretic.  HENT:     Head: Normocephalic and atraumatic.     Right Ear: External ear normal.     Left Ear: External ear normal.     Nose: Nose normal.  Eyes:     General:        Right eye: No discharge.        Left eye: No discharge.  Cardiovascular:     Rate and Rhythm: Normal rate and regular rhythm.     Heart sounds: Normal heart sounds.  Pulmonary:     Effort: Pulmonary effort is normal.     Breath sounds: Normal breath sounds.  Abdominal:     Palpations: Abdomen is soft.     Tenderness: There is no abdominal tenderness.  Musculoskeletal:     Cervical back: Neck supple.  Skin:    General: Skin is warm and dry.  Neurological:     Mental Status: He is alert.    Psychiatric:        Mood and Affect: Mood is not anxious.     ED Results / Procedures / Treatments   Labs (all labs ordered are listed, but only abnormal results are displayed) Labs Reviewed  RESPIRATORY PANEL BY RT PCR (FLU A&B, COVID)  COMPREHENSIVE METABOLIC PANEL  CBC  PROTIME-INR  TYPE AND SCREEN    EKG None  Radiology ECHOCARDIOGRAM COMPLETE  Result Date: 08/06/2020    ECHOCARDIOGRAM REPORT   Patient Name:   Kevin Lambert. Date of Exam: 08/06/2020 Medical Rec #:  [id number]       Height:       69.0 in Accession #:    [id number]      Weight:       253.9 lb Date of Birth:  01-Aug-1956       BSA:          2.287 m Patient Age:    55 years        BP:           152/66 mmHg Patient Gender: M               HR:           87 bpm. Exam Location:  [facility] Procedure: 2D Echo, Cardiac Doppler and Color Doppler Indications:    M79.89 Swelling of both upper extremities  History:        Patient has no prior history of Echocardiogram examinations.                 Risk Factors:Hypertension, Diabetes and Current Smoker. Dyspnea  on exertion. Non-small cell cancer of right lung.  Sonographer:    Diamond Nickel RCS Referring Phys: [id number] [place]  1. Left ventricular ejection fraction, by estimation, is 55%. The left ventricle has normal function. The left ventricle has no regional wall motion abnormalities. There is moderate left ventricular hypertrophy. Left ventricular diastolic parameters are  consistent with Grade I diastolic dysfunction (impaired relaxation).  2. Right ventricular systolic function is normal. The right ventricular size is normal. Tricuspid regurgitation signal is inadequate for assessing PA pressure.  3. The mitral valve is normal in structure. No evidence of mitral valve regurgitation. No evidence of mitral stenosis.  4. The aortic valve is tricuspid. Aortic valve regurgitation is not visualized. Mild aortic valve sclerosis is present,  with no evidence of aortic valve stenosis.  5. Aortic dilatation noted. There is mild dilatation of the aortic root, measuring 38 mm.  6. The inferior vena cava is normal in size with greater than 50% respiratory variability, suggesting right atrial pressure of 3 mmHg. FINDINGS  Left Ventricle: Left ventricular ejection fraction, by estimation, is 55%. The left ventricle has normal function. The left ventricle has no regional wall motion abnormalities. The left ventricular internal cavity size was normal in size. There is moderate left ventricular hypertrophy. Left ventricular diastolic parameters are consistent with Grade I diastolic dysfunction (impaired relaxation). Right Ventricle: The right ventricular size is normal. No increase in right ventricular wall thickness. Right ventricular systolic function is normal. Tricuspid regurgitation signal is inadequate for assessing PA pressure. Left Atrium: Left atrial size was normal in size. Right Atrium: Right atrial size was normal in size. Pericardium: There is no evidence of pericardial effusion. Mitral Valve: The mitral valve is normal in structure. No evidence of mitral valve regurgitation. No evidence of mitral valve stenosis. Tricuspid Valve: The tricuspid valve is normal in structure. Tricuspid valve regurgitation is not demonstrated. Aortic Valve: The aortic valve is tricuspid. Aortic valve regurgitation is not visualized. Mild aortic valve sclerosis is present, with no evidence of aortic valve stenosis. Pulmonic Valve: The pulmonic valve was normal in structure. Pulmonic valve regurgitation is not visualized. Aorta: Aortic dilatation noted. There is mild dilatation of the aortic root, measuring 38 mm. Venous: The inferior vena cava is normal in size with greater than 50% respiratory variability, suggesting right atrial pressure of 3 mmHg. IAS/Shunts: No atrial level shunt detected by color flow Doppler.  LEFT VENTRICLE PLAX 2D LVIDd:         4.30 cm   Diastology LVIDs:         2.50 cm  LV e' medial:    7.07 cm/s LV PW:         1.70 cm  LV E/e' medial:  8.4 LV IVS:        1.90 cm  LV e' lateral:   10.00 cm/s LVOT diam:     2.30 cm  LV E/e' lateral: 5.9 LV SV:         93 LV SV Index:   41 LVOT Area:     4.15 cm  RIGHT VENTRICLE RV Basal diam:  3.00 cm RV S prime:     14.40 cm/s TAPSE (M-mode): 2.6 cm LEFT ATRIUM             Index       RIGHT ATRIUM           Index LA diam:        3.60 cm 1.57 cm/m  RA Area:  15.30 cm LA Vol (A2C):   85.2 ml 37.24 ml/m RA Volume:   37.70 ml  16.49 ml/m LA Vol (A4C):   44.2 ml 19.33 ml/m LA Biplane Vol: 61.1 ml 26.72 ml/m  AORTIC VALVE LVOT Vmax:   133.00 cm/s LVOT Vmean:  72.100 cm/s LVOT VTI:    0.225 m  AORTA Ao Root diam: 4.00 cm MITRAL VALVE MV Area (PHT): 3.08 cm    SHUNTS MV Decel Time: 246 msec    Systemic VTI:  0.22 m MV E velocity: 59.10 cm/s  Systemic Diam: 2.30 cm MV A velocity: 97.30 cm/s MV E/A ratio:  0.61 Loralie Champagne MD Electronically signed by Loralie Champagne MD Signature Date/Time: 08/06/2020/5:00:45 PM    Final    VAS [place] UPPER EXTREMITY VENOUS DUPLEX  Result Date: 08/07/2020 UPPER VENOUS STUDY  Indications: Swelling, Edema, and Bilateral arms and hands have been swollen for about 2 months. Patient denies SOB or chest pains. Risk Factors: Cancer Lung. Comparison Study: None Performing Technologist: Alecia Mackin RVT, RDCS (AE), RDMS  Examination Guidelines: A complete evaluation includes B-mode imaging, spectral Doppler, color Doppler, and power Doppler as needed of all accessible portions of each vessel. Bilateral testing is considered an integral part of a complete examination. Limited examinations for reoccurring indications may be performed as noted.  Right Findings: +----------+------------+---------+-----------+----------+-------+ RIGHT     CompressiblePhasicitySpontaneousPropertiesSummary +----------+------------+---------+-----------+----------+-------+ IJV           Full       Yes        Yes                      +----------+------------+---------+-----------+----------+-------+ Subclavian               Yes       Yes                      +----------+------------+---------+-----------+----------+-------+ Axillary      Full       Yes       Yes                      +----------+------------+---------+-----------+----------+-------+ Brachial      Full       Yes       Yes                      +----------+------------+---------+-----------+----------+-------+ Radial        Full       Yes       Yes                      +----------+------------+---------+-----------+----------+-------+ Ulnar         Full       Yes       Yes                      +----------+------------+---------+-----------+----------+-------+ Cephalic      Full                                          +----------+------------+---------+-----------+----------+-------+ Basilic       Full                                          +----------+------------+---------+-----------+----------+-------+  Left Findings: +----------+------------+---------+-----------+----------+-------+ LEFT      CompressiblePhasicitySpontaneousPropertiesSummary +----------+------------+---------+-----------+----------+-------+ IJV           Full       Yes       Yes                      +----------+------------+---------+-----------+----------+-------+ Subclavian               Yes       Yes                      +----------+------------+---------+-----------+----------+-------+ Axillary      Full       Yes       Yes                      +----------+------------+---------+-----------+----------+-------+ Brachial      Full       Yes       Yes                      +----------+------------+---------+-----------+----------+-------+ Radial        Full       Yes       Yes                      +----------+------------+---------+-----------+----------+-------+ Ulnar         Full        Yes       Yes                      +----------+------------+---------+-----------+----------+-------+ Cephalic      Full                                          +----------+------------+---------+-----------+----------+-------+ Basilic       Full                                          +----------+------------+---------+-----------+----------+-------+  Findings reported to [facility] email through EPIC at 2:45 pm.  Summary: No evidence of deep vein or superficial vein thrombosis involving the right and left upper extremities. Right: No evidence of superficial vein thrombosis in the upper extremity. No evidence of thrombosis in the subclavian.  Left: No evidence of superficial vein thrombosis in the upper extremity. No evidence of thrombosis in the subclavian.  Bilateral superficial edema noted in both forearms and hands, right > left. *See table(s) above for measurements and observations.  Diagnosing physician: Larae Grooms MD Electronically signed by Larae Grooms MD on 08/07/2020 at 6:08:58 PM.    Final     Procedures Procedures (including critical care time)  Medications Ordered in ED Medications - No data to display  ED Course  I have reviewed the triage vital signs and the nursing notes.  Pertinent labs & imaging results that were available during my care of the patient were reviewed by me and considered in my medical decision making (see chart for details).    MDM Rules/Calculators/A&P                          Patient stable on initial exam. No signs of shock/severe bleeding. Concern for Upper GI bleed.  Difficulty getting labs via nursing staff, and patient apparently got up and left. I was made aware of this after he left and did not have a chance to re-evaluate or talk to him. Final Clinical Impression(s) / ED Diagnoses Final diagnoses:  Hematemesis, presence of nausea not specified    Rx / DC Orders ED Discharge Orders    None         Sherwood Gambler, MD 08/08/20 [phone]

## 2020-08-08 NOTE — ED Triage Notes (Signed)
Patient is coming from home by [facility]. Patient complaining of blood in vomit and poop. Patient states he has lung cancer. Last chemo treatment on Monday. Abdominal pain went away after vomiting.

## 2020-08-10 ENCOUNTER — Telehealth: Payer: Self-pay | Admitting: Physician Assistant

## 2020-08-10 ENCOUNTER — Inpatient Hospital Stay: Payer: No Typology Code available for payment source

## 2020-08-10 ENCOUNTER — Other Ambulatory Visit: Payer: Self-pay

## 2020-08-10 ENCOUNTER — Ambulatory Visit: Payer: Non-veteran care | Admitting: Pulmonary Disease

## 2020-08-10 ENCOUNTER — Inpatient Hospital Stay: Payer: No Typology Code available for payment source | Attending: Internal Medicine | Admitting: Physician Assistant

## 2020-08-10 ENCOUNTER — Telehealth: Payer: Self-pay | Admitting: *Deleted

## 2020-08-10 VITALS — BP 105/63 | HR 74 | Resp 17

## 2020-08-10 VITALS — BP 125/63 | HR 88 | Temp 97.2°F | Resp 18 | Ht 69.0 in | Wt 244.2 lb

## 2020-08-10 DIAGNOSIS — M7989 Other specified soft tissue disorders: Secondary | ICD-10-CM

## 2020-08-10 DIAGNOSIS — I10 Essential (primary) hypertension: Secondary | ICD-10-CM | POA: Diagnosis not present

## 2020-08-10 DIAGNOSIS — Z5112 Encounter for antineoplastic immunotherapy: Secondary | ICD-10-CM | POA: Diagnosis present

## 2020-08-10 DIAGNOSIS — R600 Localized edema: Secondary | ICD-10-CM | POA: Diagnosis not present

## 2020-08-10 DIAGNOSIS — C3491 Malignant neoplasm of unspecified part of right bronchus or lung: Secondary | ICD-10-CM

## 2020-08-10 DIAGNOSIS — C3411 Malignant neoplasm of upper lobe, right bronchus or lung: Secondary | ICD-10-CM | POA: Insufficient documentation

## 2020-08-10 DIAGNOSIS — Z79899 Other long term (current) drug therapy: Secondary | ICD-10-CM | POA: Diagnosis not present

## 2020-08-10 DIAGNOSIS — R609 Edema, unspecified: Secondary | ICD-10-CM

## 2020-08-10 DIAGNOSIS — Z452 Encounter for adjustment and management of vascular access device: Secondary | ICD-10-CM | POA: Insufficient documentation

## 2020-08-10 DIAGNOSIS — Z95828 Presence of other vascular implants and grafts: Secondary | ICD-10-CM

## 2020-08-10 LAB — CMP (CANCER CENTER ONLY)
ALT: 54 U/L — ABNORMAL HIGH (ref 0–44)
AST: 44 U/L — ABNORMAL HIGH (ref 15–41)
Albumin: 2.7 g/dL — ABNORMAL LOW (ref 3.5–5.0)
Alkaline Phosphatase: 67 U/L (ref 38–126)
Anion gap: 7 (ref 5–15)
BUN: 12 mg/dL (ref 8–23)
CO2: 26 mmol/L (ref 22–32)
Calcium: 9.7 mg/dL (ref 8.9–10.3)
Chloride: 101 mmol/L (ref 98–111)
Creatinine: 1.47 mg/dL — ABNORMAL HIGH (ref 0.61–1.24)
GFR, Est AFR Am: 58 mL/min — ABNORMAL LOW (ref >60)
GFR, Estimated: 50 mL/min — ABNORMAL LOW (ref >60)
Glucose, Bld: 93 mg/dL (ref 70–99)
Potassium: 3.7 mmol/L (ref 3.5–5.1)
Sodium: 134 mmol/L — ABNORMAL LOW (ref 135–145)
Total Bilirubin: 0.7 mg/dL (ref 0.3–1.2)
Total Protein: 6.9 g/dL (ref 6.5–8.1)

## 2020-08-10 LAB — CBC WITH DIFFERENTIAL (CANCER CENTER ONLY)
Abs Immature Granulocytes: 0.03 10*3/uL (ref 0.00–0.07)
Basophils Absolute: 0 10*3/uL (ref 0.0–0.1)
Basophils Relative: 0 %
Eosinophils Absolute: 0.1 10*3/uL (ref 0.0–0.5)
Eosinophils Relative: 2 %
HCT: 28.1 % — ABNORMAL LOW (ref 39.0–52.0)
Hemoglobin: 9.7 g/dL — ABNORMAL LOW (ref 13.0–17.0)
Immature Granulocytes: 1 %
Lymphocytes Relative: 30 %
Lymphs Abs: 0.9 10*3/uL (ref 0.7–4.0)
MCH: 34.9 pg — ABNORMAL HIGH (ref 26.0–34.0)
MCHC: 34.5 g/dL (ref 30.0–36.0)
MCV: 101.1 fL — ABNORMAL HIGH (ref 80.0–100.0)
Monocytes Absolute: 0.1 10*3/uL (ref 0.1–1.0)
Monocytes Relative: 4 %
Neutro Abs: 1.9 10*3/uL (ref 1.7–7.7)
Neutrophils Relative %: 63 %
Platelet Count: 174 10*3/uL (ref 150–400)
RBC: 2.78 MIL/uL — ABNORMAL LOW (ref 4.22–5.81)
RDW: 16 % — ABNORMAL HIGH (ref 11.5–15.5)
WBC Count: 3.1 10*3/uL — ABNORMAL LOW (ref 4.0–10.5)
nRBC: 0 % (ref 0.0–0.2)

## 2020-08-10 LAB — TSH: TSH: 1.487 u[IU]/mL (ref 0.320–4.118)

## 2020-08-10 MED ORDER — FUROSEMIDE 10 MG/ML IJ SOLN
40.0000 mg | Freq: Once | INTRAMUSCULAR | Status: AC
  Administered 2020-08-10: 40 mg via INTRAVENOUS

## 2020-08-10 MED ORDER — HEPARIN SOD (PORK) LOCK FLUSH 100 UNIT/ML IV SOLN
500.0000 [IU] | Freq: Once | INTRAVENOUS | Status: AC
  Administered 2020-08-10: 500 [IU]
  Filled 2020-08-10: qty 5

## 2020-08-10 MED ORDER — SODIUM CHLORIDE 0.9% FLUSH
10.0000 mL | Freq: Once | INTRAVENOUS | Status: AC
  Administered 2020-08-10: 10 mL
  Filled 2020-08-10: qty 10

## 2020-08-10 MED ORDER — FUROSEMIDE 10 MG/ML IJ SOLN
INTRAMUSCULAR | Status: AC
  Filled 2020-08-10: qty 4

## 2020-08-10 NOTE — Telephone Encounter (Signed)
Scheduled per 10/4 los. Unable to reach pt. Left voicemail with appt times and date.

## 2020-08-10 NOTE — Patient Instructions (Signed)

## 2020-08-10 NOTE — Telephone Encounter (Signed)
Faxed office note, Echo and VAS [place] to Dr Domenica Fail, patient's PCP at [facility]. Also called to let them know about continued swelling.

## 2020-08-10 NOTE — Patient Instructions (Signed)
Peripheral Edema  Peripheral edema is swelling that is caused by a buildup of fluid. Peripheral edema most often affects the lower legs, ankles, and feet. It can also develop in the arms, hands, and face. The area of the body that has peripheral edema will look swollen. It may also feel heavy or warm. Your clothes may start to feel tight. Pressing on the area may make a temporary dent in your skin. You may not be able to move your swollen arm or leg as much as usual. There are many causes of peripheral edema. It can happen because of a complication of other conditions such as congestive heart failure, kidney disease, or a problem with your blood circulation. It also can be a side effect of certain medicines or because of an infection. It often happens to women during pregnancy. Sometimes, the cause is not known. Follow these instructions at home: Managing pain, stiffness, and swelling   Raise (elevate) your legs while you are sitting or lying down.  Move around often to prevent stiffness and to lessen swelling.  Do not sit or stand for long periods of time.  Wear support stockings as told by your health care provider. Medicines  Take over-the-counter and prescription medicines only as told by your health care provider.  Your health care provider may prescribe medicine to help your body get rid of excess water (diuretic). General instructions  Pay attention to any changes in your symptoms.  Follow instructions from your health care provider about limiting salt (sodium) in your diet. Sometimes, eating less salt may reduce swelling.  Moisturize skin daily to help prevent skin from cracking and draining.  Keep all follow-up visits as told by your health care provider. This is important. Contact a health care provider if you have:  A fever.  Edema that starts suddenly or is getting worse, especially if you are pregnant or have a medical condition.  Swelling in only one leg.  Increased  swelling, redness, or pain in one or both of your legs.  Drainage or sores at the area where you have edema. Get help right away if you:  Develop shortness of breath, especially when you are lying down.  Have pain in your chest or abdomen.  Feel weak.  Feel faint. Summary  Peripheral edema is swelling that is caused by a buildup of fluid. Peripheral edema most often affects the lower legs, ankles, and feet.  Move around often to prevent stiffness and to lessen swelling. Do not sit or stand for long periods of time.  Pay attention to any changes in your symptoms.  Contact a health care provider if you have edema that starts suddenly or is getting worse, especially if you are pregnant or have a medical condition.  Get help right away if you develop shortness of breath, especially when lying down. This information is not intended to replace advice given to you by your health care provider. Make sure you discuss any questions you have with your health care provider. Document Revised: 07/18/2018 Document Reviewed: 07/18/2018 Elsevier Patient Education  [place].  Furosemide Injection What is this medicine? FUROSEMIDE (fyoor [place] se mide) is a diuretic. It helps you make more urine and to lose salt and excess water. This medicine is used to treat high blood pressure, and edema or swelling from heart, kidney, or liver disease. This medicine may be used for other purposes; ask your health care provider or pharmacist if you have questions. What should I tell my  health care provider before I take this medicine? They need to know if you have any of these conditions:  abnormal blood electrolytes  diarrhea or vomiting  gout  heart disease  kidney disease, small amounts of urine, or difficulty passing urine  liver disease  premature newborn  thyroid disease  an unusual or allergic reaction to furosemide, sulfa drugs, other medicines, foods, dyes, or  preservatives  pregnant or trying to get pregnant  breast-feeding How should I use this medicine? This medicine is for injection into a muscle or a vein. It is given by a healthcare professional in a hospital or clinic setting. Talk to your pediatrician regarding the use of this medicine in children. While this drug may be prescribed for selected conditions, precautions do apply. Overdosage: If you think you have taken too much of this medicine contact a poison control center or emergency room at once. NOTE: This medicine is only for you. Do not share this medicine with others. What if I miss a dose? This does not apply. What may interact with this medicine?  aspirin and aspirin-like medicines  certain antibiotics  chloral hydrate  cisplatin  cyclosporine  digoxin  diuretics  laxatives  lithium  medicines for blood pressure  medicines that relax muscles for surgery  methotrexate  NSAIDS, medicines for pain and inflammation like ibuprofen, naproxen, or indomethacin  phenytoin  steroid medicines like prednisone or cortisone  sucralfate  thyroid hormones This list may not describe all possible interactions. Give your health care provider a list of all the medicines, herbs, non-prescription drugs, or dietary supplements you use. Also tell them if you smoke, drink alcohol, or use illegal drugs. Some items may interact with your medicine. What should I watch for while using this medicine? You will be monitored closely while you are on this medicine. This medicine can increase the amount of sugar in blood or urine. If you are a diabetic your sugar will need to be checked. This medicine may cause serious skin reactions. They can happen weeks to months after starting the medicine. Contact your health care provider right away if you notice fevers or flu-like symptoms with a rash. The rash may be red or purple and then turn into blisters or peeling of the skin. Or, you might  notice a red rash with swelling of the face, lips or lymph nodes in your neck or under your arms. You may get drowsy or dizzy. Do not drive, use machinery, or do anything that needs mental alertness until you know how this drug affects you. Do not stand or sit up quickly, especially if you are an older patient. This reduces the risk of dizzy or fainting spells. Alcohol can make you more drowsy and dizzy. Avoid alcoholic drinks. This medicine can make you more sensitive to the sun. Keep out of the sun. If you cannot avoid being in the sun, wear protective clothing and use sunscreen. Do not use sun lamps or tanning beds/booths. What side effects may I notice from receiving this medicine? Side effects that you should report to your doctor or health care professional as soon as possible:  blood in urine or stools  dry mouth  fever or chills  hearing loss or ringing in the ears  irregular heartbeat  muscle pain or weakness, cramps  rash, fever, and swollen lymph nodes  redness, blistering, peeling or loosening of the skin, including inside the mouth  stomach upset, pain, or nausea  tingling or numbness in the hands or  feet  unusually weak or tired  vomiting or diarrhea  yellowing of the eyes or skin Side effects that usually do not require medical attention (report to your doctor or health care professional if they continue or are bothersome):  headache  loss of appetite  unusual bleeding or bruising This list may not describe all possible side effects. Call your doctor for medical advice about side effects. You may report side effects to FDA at 1-800-FDA-1088. Where should I keep my medicine? This drug is given in a hospital or clinic and will not be stored at home. NOTE: This sheet is a summary. It may not cover all possible information. If you have questions about this medicine, talk to your doctor, pharmacist, or health care provider.  2020 Elsevier/Gold Standard (2019-01-25  13:57:49)

## 2020-08-11 ENCOUNTER — Ambulatory Visit: Payer: Non-veteran care | Admitting: Pulmonary Disease

## 2020-08-13 ENCOUNTER — Telehealth: Payer: Self-pay | Admitting: *Deleted

## 2020-08-13 NOTE — Telephone Encounter (Signed)
Patient called to advise that he wasn't able to get the lasix from [facility] until today and it was prescribed on 08/03/2020.  He wanted to advise Cassie of the delay.  Labs and visit are scheduled next Wednesday.

## 2020-08-15 NOTE — Progress Notes (Signed)
[facility] OFFICE PROGRESS NOTE  [name], [place]  [place] [id number]  DIAGNOSIS: Stage IV (T2b, N3, M1a) non-small cell lung cancer, adenocarcinoma presented with large right upper lobe lung mass in addition to mediastinal and right supraclavicular lymphadenopathy as well as bilateral pulmonary nodules diagnosed in February 2021.  MOLECULAR STUDY by [employer]:  KRASG12C,4.3%,Binimetinib  [id number],0.9%,Niraparib, Olaparib, Rucaparib,Talazoparib, Tazemetostat  [id number],0.2%,HENI  PRIOR THERAPY: None  CURRENT THERAPY: Systemic chemotherapy with carboplatin for AUC of 5, Alimta 500 mg/M2 and Keytruda 200 mg IV every 3 weeks. First dose December 31, 2019.Status post10cycles. Starting from cycle #5, he will be on maintenance Alimta and Keytruda.  INTERVAL HISTORY: Serigne Kubicek. 64 y.o. male returns to the clinic today for a follow-up visit.  The patient is feeling fair. He has been experiencing significant swelling for several weeks. The swelling is in the bilateral upper extremities (R>L) as well as lower extremities.  This had started several months ago but had gotten worse over the course of the few weeks.  He had an echocardiogram performed as well as a bilateral upper extremity Doppler ultrasound.  He previously had had a Doppler ultrasound of the lower extremities performed which is negative for DVT.  The patient denies any known cardiac history except for hypertension.  He is currently on HCTZ as well as Norvasc for hypertension. He does not see cardiology. He also follows with vascular who performed a lower extremity ultrasound. He is seen at the [facility] by his PCP. Two weeks ago, we prescribed him 40 meq of lasix and 20 meq daily of potassium. The patient never received this prescription from the [place] until 08/13/20. He has not started this prescription yet. He received IV lasix last week which he states helped somewhat. and his records  from his office visit was faxed to his PCP at the [place]. They talked to him on the phone. Per their note, they recommended starting HCTZ 25 mg daily as well as lisinopril. They may consider reducing his norvasc if his BP allows. He did not take his BP medications today.  His blood pressure is elevated. He said he had an episode of lightheadedness this AM.   Otherwise,  he denies any night sweats or weight loss.  He denies any fevers.  He denies any chest pain or hemoptysis but reports his baseline dyspnea on exertion. He stats he had coughing fits twice since his last appointment.  The patient is currently trying to quit smoking cigars.  He denies any headache, visual changes, or extremity weakness.  The patient is here today for evaluation and repeat blood work.  MEDICAL HISTORY: Past Medical History:  Diagnosis Date  . Arthritis   . Depression   . DM (diabetes mellitus) ([place])    type II  . Dyslipidemia   . Dyspnea    unable to walk much - he thinks it is from   . GERD (gastroesophageal reflux disease)   . Hepatitis    Hepatitis C- treated  . History of kidney stones   . HTN (hypertension)   . Neuropathy   . nscl ca dx'd 10/2019  . OSA on CPAP   . PTSD (post-traumatic stress disorder)     ALLERGIES:  is allergic to metformin and related.  MEDICATIONS:  Current Outpatient Medications  Medication Sig Dispense Refill  . albuterol (VENTOLIN HFA) 108 (90 Base) MCG/ACT inhaler Inhale 2 puffs into the lungs every 4 (four) hours as needed for wheezing or shortness  of breath. 18 g 3  . amLODipine (NORVASC) 10 MG tablet Take 10 mg by mouth daily.    . Ascorbic Acid (VITAMIN C) 1000 MG tablet Take 1,000 mg by mouth daily.    Marland Kitchen aspirin EC 81 MG tablet Take 81 mg by mouth daily.    Marland Kitchen atorvastatin (LIPITOR) 10 MG tablet Take 10 mg by mouth daily.    . bisacodyl (DULCOLAX) 5 MG EC tablet Take 5 mg by mouth daily as needed for moderate constipation.    . cholecalciferol (VITAMIN D3) 25 MCG (1000  UNIT) tablet Take 1,000 Units by mouth daily.    . Cyanocobalamin (VITAMIN B 12 PO) Take 1 tablet by mouth daily.    . folic acid (FOLVITE) 1 MG tablet Take 1 tablet (1 mg total) by mouth daily. Pt needs to pick up today and start ASAP . He will pay out of pocket. He is waiting on the [place] to send his folic acid which will be next week. (Patient taking differently: Take 1 mg by mouth daily. ) 7 tablet 0  . furosemide (LASIX) 20 MG tablet Take 1 tablet (20 mg total) by mouth 2 (two) times daily. 20 tablet 0  . glipiZIDE (GLUCOTROL) 5 MG tablet Take 5 mg by mouth daily before breakfast.     . lidocaine-prilocaine (EMLA) cream Apply to Port-A-Cath site 30-60-minute before treatment. 30 g 0  . magic mouthwash SOLN Take 5 mLs by mouth 4 (four) times daily as needed for mouth pain. Swish and swallow or spit 240 mL 2  . naproxen (NAPROSYN) 500 MG tablet Take 500 mg by mouth 2 (two) times daily with a meal.     . nicotine (NICODERM CQ) 21 mg/24hr patch Place 1 patch (21 mg total) onto the skin daily. 28 patch 3  . nicotine polacrilex (COMMIT) 4 MG lozenge Take 1 lozenge (4 mg total) by mouth as needed for smoking cessation. 108 tablet 3  . potassium chloride SA (KLOR-CON) 20 MEQ tablet Take 1 tablet (20 mEq total) by mouth daily. 10 tablet 0  . pregabalin (LYRICA) 25 MG capsule Take 1 capsule (25 mg total) by mouth daily. 30 capsule 1  . Propylene Glycol (SYSTANE BALANCE OP) Place 1 drop into both eyes daily.    Marland Kitchen triamterene-hydrochlorothiazide (MAXZIDE-25) 37.5-25 MG tablet Take 1 tablet by mouth daily.     Marland Kitchen umeclidinium-vilanterol (ANORO ELLIPTA) 62.5-25 MCG/INH AEPB Inhale 1 puff into the lungs daily. 60 each 11  . umeclidinium-vilanterol (ANORO ELLIPTA) 62.5-25 MCG/INH AEPB Inhale 1 puff into the lungs daily. 14 each 0  . zinc gluconate 50 MG tablet Take 50 mg by mouth daily.     No current facility-administered medications for this visit.    SURGICAL HISTORY:  Past Surgical History:  Procedure  Laterality Date  . BIOPSY OF MEDIASTINAL MASS  12/10/2019   Procedure: BIOPSY OF MEDIASTINAL MASS;  Surgeon: Garner Nash, DO;  Location: [facility];  Service: Pulmonary;;  right upper lobe  . BRONCHIAL NEEDLE ASPIRATION BIOPSY  12/10/2019   Procedure: BRONCHIAL NEEDLE ASPIRATION BIOPSIES;  Surgeon: Garner Nash, DO;  Location: [facility];  Service: Pulmonary;;  . COLONOSCOPY    . IR IMAGING GUIDED PORT INSERTION  12/31/2019  . kidney stone    . URETERAL STENT PLACEMENT    . Ureteral Stent Removed    . VIDEO BRONCHOSCOPY WITH ENDOBRONCHIAL ULTRASOUND N/A 12/10/2019   Procedure: VIDEO BRONCHOSCOPY WITH ENDOBRONCHIAL ULTRASOUND;  Surgeon: Garner Nash, DO;  Location: [facility];  Service: Pulmonary;  Laterality: N/A;    REVIEW OF SYSTEMS:   Review of Systems  Constitutional:Positive for fatigue.Negative for appetite change, chills, fever and unexpected weight change.  HENT:Negative for mouth sores, sore throat and trouble swallowing.  Eyes: Negative for eye problems and icterus.  Respiratory:Positive for baseline shortness of breath with exertion.Negative for cough, hemoptysis, and wheezing.  Cardiovascular:Positive forbilateral upper extremity swelling. Positive for some bilateral lower extremity swelling.Negative for chest pain. Gastrointestinal: Negative for constipation and nausea. Genitourinary: Negative for bladder incontinence, difficulty urinating, dysuria, frequency and hematuria.  Musculoskeletal: Negative for back pain, gait problem, neck pain and neck stiffness.  Skin:Positive for darkening of the skin in the left lower extremity with associated dry skin. Neurological: Positive for one episode of dizziness this AM. Negative for extremity weakness, gait problem, headaches, light-headedness and seizures.  Hematological: Negative for adenopathy. Does not bruise/bleed easily.  Psychiatric/Behavioral: Negative for confusion, depression and sleep disturbance.  The patient is not nervous/anxious.   PHYSICAL EXAMINATION:  There were no vitals taken for this visit.  ECOG PERFORMANCE STATUS: 1 - Symptomatic but completely ambulatory  Physical Exam  Constitutional: Oriented to person, place, and time and well-developed, well-nourished, and in no distress.  HENT:  Head: Normocephalic and atraumatic.  Mouth/Throat: Oropharynx is clear and moist. No oropharyngeal exudate.  Eyes:Positive for tearing eyes.Conjunctivae are normal. Right eye exhibits no discharge. Left eye exhibits no discharge. No scleral icterus.  Neck: Normal range of motion. Neck supple.  Cardiovascular: Normal rate, regular rhythm, normal heart sounds and intact distal pulses.  Pulmonary/Chest: Effort normal and breath sounds normal. No respiratory distress. No wheezes. No rales.  Abdominal: Soft. Bowel sounds are normal. Exhibits no distension and no mass. There is no tenderness.  Musculoskeletal: Positive for bilateral upper extremity swelling (R>L).Positive for some bilateral lower extremity swelling.normal range of motion.  Lymphadenopathy:  No cervical adenopathy.  Neurological: Alert and oriented to person, place, and time. Exhibits normal muscle tone. Gait normal. Coordination normal.  Skin:Positive for darkening of the skin in the left lower extremity with associated dry skin.Not diaphoretic. No erythema. No pallor.  Psychiatric: Mood, memory and judgment normal.  Vitals reviewed.  LABORATORY DATA: Lab Results  Component Value Date   WBC 3.1 (L) 08/10/2020   HGB 9.7 (L) 08/10/2020   HCT 28.1 (L) 08/10/2020   MCV 101.1 (H) 08/10/2020   PLT 174 08/10/2020      Chemistry      Component Value Date/Time   NA 134 (L) 08/10/2020 1012   K 3.7 08/10/2020 1012   CL 101 08/10/2020 1012   CO2 26 08/10/2020 1012   BUN 12 08/10/2020 1012   CREATININE 1.47 (H) 08/10/2020 1012      Component Value Date/Time   CALCIUM 9.7 08/10/2020 1012   ALKPHOS 67 08/10/2020  1012   AST 44 (H) 08/10/2020 1012   ALT 54 (H) 08/10/2020 1012   BILITOT 0.7 08/10/2020 1012       RADIOGRAPHIC STUDIES:  ECHOCARDIOGRAM COMPLETE  Result Date: 08/06/2020    ECHOCARDIOGRAM REPORT   Patient Name:   Kass Herberger. Date of Exam: 08/06/2020 Medical Rec #:  [id number]       Height:       69.0 in Accession #:    [id number]      Weight:       253.9 lb Date of Birth:  09/26/1956       BSA:          2.287 m Patient Age:  63 years        BP:           152/66 mmHg Patient Gender: M               HR:           87 bpm. Exam Location:  [facility] Procedure: 2D Echo, Cardiac Doppler and Color Doppler Indications:    M79.89 Swelling of both upper extremities  History:        Patient has no prior history of Echocardiogram examinations.                 Risk Factors:Hypertension, Diabetes and Current Smoker. Dyspnea                 on exertion. Non-small cell cancer of right lung.  Sonographer:    Diamond Nickel RCS Referring Phys: [id number] [place]  1. Left ventricular ejection fraction, by estimation, is 55%. The left ventricle has normal function. The left ventricle has no regional wall motion abnormalities. There is moderate left ventricular hypertrophy. Left ventricular diastolic parameters are  consistent with Grade I diastolic dysfunction (impaired relaxation).  2. Right ventricular systolic function is normal. The right ventricular size is normal. Tricuspid regurgitation signal is inadequate for assessing PA pressure.  3. The mitral valve is normal in structure. No evidence of mitral valve regurgitation. No evidence of mitral stenosis.  4. The aortic valve is tricuspid. Aortic valve regurgitation is not visualized. Mild aortic valve sclerosis is present, with no evidence of aortic valve stenosis.  5. Aortic dilatation noted. There is mild dilatation of the aortic root, measuring 38 mm.  6. The inferior vena cava is normal in size with greater than 50% respiratory  variability, suggesting right atrial pressure of 3 mmHg. FINDINGS  Left Ventricle: Left ventricular ejection fraction, by estimation, is 55%. The left ventricle has normal function. The left ventricle has no regional wall motion abnormalities. The left ventricular internal cavity size was normal in size. There is moderate left ventricular hypertrophy. Left ventricular diastolic parameters are consistent with Grade I diastolic dysfunction (impaired relaxation). Right Ventricle: The right ventricular size is normal. No increase in right ventricular wall thickness. Right ventricular systolic function is normal. Tricuspid regurgitation signal is inadequate for assessing PA pressure. Left Atrium: Left atrial size was normal in size. Right Atrium: Right atrial size was normal in size. Pericardium: There is no evidence of pericardial effusion. Mitral Valve: The mitral valve is normal in structure. No evidence of mitral valve regurgitation. No evidence of mitral valve stenosis. Tricuspid Valve: The tricuspid valve is normal in structure. Tricuspid valve regurgitation is not demonstrated. Aortic Valve: The aortic valve is tricuspid. Aortic valve regurgitation is not visualized. Mild aortic valve sclerosis is present, with no evidence of aortic valve stenosis. Pulmonic Valve: The pulmonic valve was normal in structure. Pulmonic valve regurgitation is not visualized. Aorta: Aortic dilatation noted. There is mild dilatation of the aortic root, measuring 38 mm. Venous: The inferior vena cava is normal in size with greater than 50% respiratory variability, suggesting right atrial pressure of 3 mmHg. IAS/Shunts: No atrial level shunt detected by color flow Doppler.  LEFT VENTRICLE PLAX 2D LVIDd:         4.30 cm  Diastology LVIDs:         2.50 cm  LV e' medial:    7.07 cm/s LV PW:         1.70 cm  LV E/e' medial:  8.4  LV IVS:        1.90 cm  LV e' lateral:   10.00 cm/s LVOT diam:     2.30 cm  LV E/e' lateral: 5.9 LV SV:         93  LV SV Index:   41 LVOT Area:     4.15 cm  RIGHT VENTRICLE RV Basal diam:  3.00 cm RV S prime:     14.40 cm/s TAPSE (M-mode): 2.6 cm LEFT ATRIUM             Index       RIGHT ATRIUM           Index LA diam:        3.60 cm 1.57 cm/m  RA Area:     15.30 cm LA Vol (A2C):   85.2 ml 37.24 ml/m RA Volume:   37.70 ml  16.49 ml/m LA Vol (A4C):   44.2 ml 19.33 ml/m LA Biplane Vol: 61.1 ml 26.72 ml/m  AORTIC VALVE LVOT Vmax:   133.00 cm/s LVOT Vmean:  72.100 cm/s LVOT VTI:    0.225 m  AORTA Ao Root diam: 4.00 cm MITRAL VALVE MV Area (PHT): 3.08 cm    SHUNTS MV Decel Time: 246 msec    Systemic VTI:  0.22 m MV E velocity: 59.10 cm/s  Systemic Diam: 2.30 cm MV A velocity: 97.30 cm/s MV E/A ratio:  0.61 Loralie Champagne MD Electronically signed by Loralie Champagne MD Signature Date/Time: 08/06/2020/5:00:45 PM    Final    VAS [place] UPPER EXTREMITY VENOUS DUPLEX  Result Date: 08/07/2020 UPPER VENOUS STUDY  Indications: Swelling, Edema, and Bilateral arms and hands have been swollen for about 2 months. Patient denies SOB or chest pains. Risk Factors: Cancer Lung. Comparison Study: None Performing Technologist: Alecia Mackin RVT, RDCS (AE), RDMS  Examination Guidelines: A complete evaluation includes B-mode imaging, spectral Doppler, color Doppler, and power Doppler as needed of all accessible portions of each vessel. Bilateral testing is considered an integral part of a complete examination. Limited examinations for reoccurring indications may be performed as noted.  Right Findings: +----------+------------+---------+-----------+----------+-------+ RIGHT     CompressiblePhasicitySpontaneousPropertiesSummary +----------+------------+---------+-----------+----------+-------+ IJV           Full       Yes       Yes                      +----------+------------+---------+-----------+----------+-------+ Subclavian               Yes       Yes                       +----------+------------+---------+-----------+----------+-------+ Axillary      Full       Yes       Yes                      +----------+------------+---------+-----------+----------+-------+ Brachial      Full       Yes       Yes                      +----------+------------+---------+-----------+----------+-------+ Radial        Full       Yes       Yes                      +----------+------------+---------+-----------+----------+-------+ Ulnar  Full       Yes       Yes                      +----------+------------+---------+-----------+----------+-------+ Cephalic      Full                                          +----------+------------+---------+-----------+----------+-------+ Basilic       Full                                          +----------+------------+---------+-----------+----------+-------+  Left Findings: +----------+------------+---------+-----------+----------+-------+ LEFT      CompressiblePhasicitySpontaneousPropertiesSummary +----------+------------+---------+-----------+----------+-------+ IJV           Full       Yes       Yes                      +----------+------------+---------+-----------+----------+-------+ Subclavian               Yes       Yes                      +----------+------------+---------+-----------+----------+-------+ Axillary      Full       Yes       Yes                      +----------+------------+---------+-----------+----------+-------+ Brachial      Full       Yes       Yes                      +----------+------------+---------+-----------+----------+-------+ Radial        Full       Yes       Yes                      +----------+------------+---------+-----------+----------+-------+ Ulnar         Full       Yes       Yes                      +----------+------------+---------+-----------+----------+-------+ Cephalic      Full                                           +----------+------------+---------+-----------+----------+-------+ Basilic       Full                                          +----------+------------+---------+-----------+----------+-------+  Findings reported to [facility] email through EP[facility] at 2:45 pm.  Summary: No evidence of deep vein or superficial vein thrombosis involving the right and left upper extremities. Right: No evidence of superficial vein thrombosis in the upper extremity. No evidence of thrombosis in the subclavian.  Left: No evidence of superficial vein thrombosis in the upper extremity. No evidence of thrombosis in the subclavian.  Bilateral superficial edema noted in both forearms and hands, right > left. *See table(s)  above for measurements and observations.  Diagnosing physician: Larae Grooms MD Electronically signed by Larae Grooms MD on 08/07/2020 at 60:08:58 PM.    Final      ASSESSMENT/PLAN:  This is a very pleasant 64 year old African-American male diagnosed with stage IV non-small cell lung cancer, adenocarcinoma. He presented with a large right upper lobe lung mass with right hilar, subcarinal, paratracheal lymphadenopathy,and right supraclavicular lymphadenopathy. He also presented with bilateral pulmonary nodules. He was diagnosed in February 2021. He does not have any actionable mutations.  The patient is currently undergoing palliative systemic chemotherapy with carboplatin for an AUC of 5, Alimta 500 mg/m, Keytruda 200 mg IV every 3 weeks. He is status post10cyclesand he tolerated it well without any adverse side effects. Starting from cycle #5, the patient has been on maintenance alimta and [place].  The patient has not started his lasix yet. Labs were reviewed which are acceptable. His potassium is a little bit low at 3.3. He has a prescription for potassium that he has not started yet. Additionally, I provided him with a handout on potassium rich foods. He was  instructed to start taking his lasix today. Hopefully his swelling will be improved for his next visit next week.  We will see him back for a follow up visit in 1 week for evaluation before considering starting cycle #12  His blood pressure was elevated today. He did not take his BP medications. I instructed him to take it upon returning home. Additionally, he is requesting a refill for his albuterol. I will send in a one time prescription for his albuterol but he will need to obtain his refills from his PCP or pulmonologist moving forward.  The patient was advised to call immediately if he has any concerning symptoms in the interval. The patient voices understanding of current disease status and treatment options and is in agreement with the current care plan. All questions were answered. The patient knows to call the clinic with any problems, questions or concerns. We can certainly see the patient much sooner if necessary       No orders of the defined types were placed in this encounter.    Charly Holcomb L Charmeka [place], PA-C 08/15/20

## 2020-08-18 ENCOUNTER — Other Ambulatory Visit: Payer: Self-pay | Admitting: Medical Oncology

## 2020-08-18 DIAGNOSIS — C3491 Malignant neoplasm of unspecified part of right bronchus or lung: Secondary | ICD-10-CM

## 2020-08-19 ENCOUNTER — Inpatient Hospital Stay (HOSPITAL_BASED_OUTPATIENT_CLINIC_OR_DEPARTMENT_OTHER): Payer: No Typology Code available for payment source | Admitting: Physician Assistant

## 2020-08-19 ENCOUNTER — Inpatient Hospital Stay: Payer: No Typology Code available for payment source

## 2020-08-19 ENCOUNTER — Other Ambulatory Visit: Payer: No Typology Code available for payment source

## 2020-08-19 ENCOUNTER — Other Ambulatory Visit: Payer: Self-pay

## 2020-08-19 VITALS — BP 173/83 | HR 96 | Temp 97.0°F | Resp 18 | Ht 69.0 in | Wt 241.9 lb

## 2020-08-19 DIAGNOSIS — Z5112 Encounter for antineoplastic immunotherapy: Secondary | ICD-10-CM | POA: Diagnosis not present

## 2020-08-19 DIAGNOSIS — C3491 Malignant neoplasm of unspecified part of right bronchus or lung: Secondary | ICD-10-CM

## 2020-08-19 DIAGNOSIS — M7989 Other specified soft tissue disorders: Secondary | ICD-10-CM

## 2020-08-19 DIAGNOSIS — Z95828 Presence of other vascular implants and grafts: Secondary | ICD-10-CM

## 2020-08-19 DIAGNOSIS — I10 Essential (primary) hypertension: Secondary | ICD-10-CM

## 2020-08-19 LAB — CBC WITH DIFFERENTIAL (CANCER CENTER ONLY)
Abs Immature Granulocytes: 0.04 10*3/uL (ref 0.00–0.07)
Basophils Absolute: 0 10*3/uL (ref 0.0–0.1)
Basophils Relative: 0 %
Eosinophils Absolute: 0.1 10*3/uL (ref 0.0–0.5)
Eosinophils Relative: 2 %
HCT: 26.8 % — ABNORMAL LOW (ref 39.0–52.0)
Hemoglobin: 9.4 g/dL — ABNORMAL LOW (ref 13.0–17.0)
Immature Granulocytes: 1 %
Lymphocytes Relative: 24 %
Lymphs Abs: 1.6 10*3/uL (ref 0.7–4.0)
MCH: 35.1 pg — ABNORMAL HIGH (ref 26.0–34.0)
MCHC: 35.1 g/dL (ref 30.0–36.0)
MCV: 100 fL (ref 80.0–100.0)
Monocytes Absolute: 0.9 10*3/uL (ref 0.1–1.0)
Monocytes Relative: 15 %
Neutro Abs: 3.8 10*3/uL (ref 1.7–7.7)
Neutrophils Relative %: 58 %
Platelet Count: 181 10*3/uL (ref 150–400)
RBC: 2.68 MIL/uL — ABNORMAL LOW (ref 4.22–5.81)
RDW: 15.5 % (ref 11.5–15.5)
WBC Count: 6.4 10*3/uL (ref 4.0–10.5)
nRBC: 0 % (ref 0.0–0.2)

## 2020-08-19 LAB — CMP (CANCER CENTER ONLY)
ALT: 61 U/L — ABNORMAL HIGH (ref 0–44)
AST: 43 U/L — ABNORMAL HIGH (ref 15–41)
Albumin: 2.5 g/dL — ABNORMAL LOW (ref 3.5–5.0)
Alkaline Phosphatase: 79 U/L (ref 38–126)
Anion gap: 7 (ref 5–15)
BUN: 7 mg/dL — ABNORMAL LOW (ref 8–23)
CO2: 30 mmol/L (ref 22–32)
Calcium: 9.8 mg/dL (ref 8.9–10.3)
Chloride: 100 mmol/L (ref 98–111)
Creatinine: 1.39 mg/dL — ABNORMAL HIGH (ref 0.61–1.24)
GFR, Estimated: 53 mL/min — ABNORMAL LOW (ref >60)
Glucose, Bld: 174 mg/dL — ABNORMAL HIGH (ref 70–99)
Potassium: 3.3 mmol/L — ABNORMAL LOW (ref 3.5–5.1)
Sodium: 137 mmol/L (ref 135–145)
Total Bilirubin: 0.2 mg/dL — ABNORMAL LOW (ref 0.3–1.2)
Total Protein: 6.7 g/dL (ref 6.5–8.1)

## 2020-08-19 LAB — TSH: TSH: 0.882 u[IU]/mL (ref 0.320–4.118)

## 2020-08-19 MED ORDER — SODIUM CHLORIDE 0.9% FLUSH
10.0000 mL | Freq: Once | INTRAVENOUS | Status: AC
  Administered 2020-08-19: 10 mL
  Filled 2020-08-19: qty 10

## 2020-08-19 MED ORDER — HEPARIN SOD (PORK) LOCK FLUSH 100 UNIT/ML IV SOLN
500.0000 [IU] | Freq: Once | INTRAVENOUS | Status: AC
  Administered 2020-08-19: 500 [IU]
  Filled 2020-08-19: qty 5

## 2020-08-19 MED ORDER — ALBUTEROL SULFATE HFA 108 (90 BASE) MCG/ACT IN AERS: 2.0000 | INHALATION_SPRAY | RESPIRATORY_TRACT | 0 refills | Status: DC | PRN

## 2020-08-19 NOTE — Patient Instructions (Signed)

## 2020-08-19 NOTE — Addendum Note (Signed)
Addended by: Ishmael Holter on: 08/19/2020 04:22 PM   Modules accepted: Orders

## 2020-08-24 ENCOUNTER — Other Ambulatory Visit: Payer: Self-pay

## 2020-08-24 ENCOUNTER — Inpatient Hospital Stay: Payer: No Typology Code available for payment source

## 2020-08-24 ENCOUNTER — Inpatient Hospital Stay (HOSPITAL_BASED_OUTPATIENT_CLINIC_OR_DEPARTMENT_OTHER): Payer: No Typology Code available for payment source | Admitting: Internal Medicine

## 2020-08-24 ENCOUNTER — Other Ambulatory Visit: Payer: Self-pay | Admitting: Internal Medicine

## 2020-08-24 ENCOUNTER — Encounter: Payer: Self-pay | Admitting: Internal Medicine

## 2020-08-24 VITALS — BP 135/74 | HR 87 | Temp 97.5°F | Resp 17 | Ht 69.0 in | Wt 245.0 lb

## 2020-08-24 DIAGNOSIS — I10 Essential (primary) hypertension: Secondary | ICD-10-CM | POA: Diagnosis not present

## 2020-08-24 DIAGNOSIS — C3491 Malignant neoplasm of unspecified part of right bronchus or lung: Secondary | ICD-10-CM

## 2020-08-24 DIAGNOSIS — Z95828 Presence of other vascular implants and grafts: Secondary | ICD-10-CM | POA: Diagnosis not present

## 2020-08-24 DIAGNOSIS — C3490 Malignant neoplasm of unspecified part of unspecified bronchus or lung: Secondary | ICD-10-CM

## 2020-08-24 DIAGNOSIS — Z5111 Encounter for antineoplastic chemotherapy: Secondary | ICD-10-CM

## 2020-08-24 DIAGNOSIS — Z5112 Encounter for antineoplastic immunotherapy: Secondary | ICD-10-CM | POA: Diagnosis not present

## 2020-08-24 LAB — CMP (CANCER CENTER ONLY)
ALT: 34 U/L (ref 0–44)
AST: 27 U/L (ref 15–41)
Albumin: 2.7 g/dL — ABNORMAL LOW (ref 3.5–5.0)
Alkaline Phosphatase: 71 U/L (ref 38–126)
Anion gap: 9 (ref 5–15)
BUN: 22 mg/dL (ref 8–23)
CO2: 28 mmol/L (ref 22–32)
Calcium: 9.9 mg/dL (ref 8.9–10.3)
Chloride: 99 mmol/L (ref 98–111)
Creatinine: 1.84 mg/dL — ABNORMAL HIGH (ref 0.61–1.24)
GFR, Estimated: 38 mL/min — ABNORMAL LOW (ref >60)
Glucose, Bld: 136 mg/dL — ABNORMAL HIGH (ref 70–99)
Potassium: 3.9 mmol/L (ref 3.5–5.1)
Sodium: 136 mmol/L (ref 135–145)
Total Bilirubin: <0.2 mg/dL — ABNORMAL LOW (ref 0.3–1.2)
Total Protein: 6.5 g/dL (ref 6.5–8.1)

## 2020-08-24 LAB — CBC WITH DIFFERENTIAL (CANCER CENTER ONLY)
Abs Immature Granulocytes: 0.05 10*3/uL (ref 0.00–0.07)
Basophils Absolute: 0 10*3/uL (ref 0.0–0.1)
Basophils Relative: 0 %
Eosinophils Absolute: 0.2 10*3/uL (ref 0.0–0.5)
Eosinophils Relative: 2 %
HCT: 26.7 % — ABNORMAL LOW (ref 39.0–52.0)
Hemoglobin: 9.1 g/dL — ABNORMAL LOW (ref 13.0–17.0)
Immature Granulocytes: 1 %
Lymphocytes Relative: 24 %
Lymphs Abs: 1.6 10*3/uL (ref 0.7–4.0)
MCH: 35.3 pg — ABNORMAL HIGH (ref 26.0–34.0)
MCHC: 34.1 g/dL (ref 30.0–36.0)
MCV: 103.5 fL — ABNORMAL HIGH (ref 80.0–100.0)
Monocytes Absolute: 0.9 10*3/uL (ref 0.1–1.0)
Monocytes Relative: 13 %
Neutro Abs: 4.2 10*3/uL (ref 1.7–7.7)
Neutrophils Relative %: 60 %
Platelet Count: 388 10*3/uL (ref 150–400)
RBC: 2.58 MIL/uL — ABNORMAL LOW (ref 4.22–5.81)
RDW: 17 % — ABNORMAL HIGH (ref 11.5–15.5)
WBC Count: 7 10*3/uL (ref 4.0–10.5)
nRBC: 0 % (ref 0.0–0.2)

## 2020-08-24 MED ORDER — HEPARIN SOD (PORK) LOCK FLUSH 100 UNIT/ML IV SOLN
500.0000 [IU] | Freq: Once | INTRAVENOUS | Status: AC | PRN
  Administered 2020-08-24: 500 [IU]
  Filled 2020-08-24: qty 5

## 2020-08-24 MED ORDER — SODIUM CHLORIDE 0.9 % IV SOLN
200.0000 mg | Freq: Once | INTRAVENOUS | Status: AC
  Administered 2020-08-24: 200 mg via INTRAVENOUS
  Filled 2020-08-24: qty 8

## 2020-08-24 MED ORDER — SODIUM CHLORIDE 0.9% FLUSH
10.0000 mL | INTRAVENOUS | Status: DC | PRN
  Administered 2020-08-24: 10 mL
  Filled 2020-08-24: qty 10

## 2020-08-24 MED ORDER — SODIUM CHLORIDE 0.9 % IV SOLN
Freq: Once | INTRAVENOUS | Status: AC
  Filled 2020-08-24: qty 250

## 2020-08-24 MED ORDER — PROCHLORPERAZINE MALEATE 10 MG PO TABS
ORAL_TABLET | ORAL | Status: AC
  Filled 2020-08-24: qty 1

## 2020-08-24 MED ORDER — SODIUM CHLORIDE 0.9% FLUSH
10.0000 mL | Freq: Once | INTRAVENOUS | Status: AC
  Administered 2020-08-24: 10 mL
  Filled 2020-08-24: qty 10

## 2020-08-24 MED ORDER — PROCHLORPERAZINE MALEATE 10 MG PO TABS
10.0000 mg | ORAL_TABLET | Freq: Once | ORAL | Status: AC
  Administered 2020-08-24: 10 mg via ORAL

## 2020-08-24 MED ORDER — SODIUM CHLORIDE 0.9 % IV SOLN
500.0000 mg/m2 | Freq: Once | INTRAVENOUS | Status: DC
  Filled 2020-08-24: qty 48

## 2020-08-24 NOTE — Patient Instructions (Addendum)
[facility] Discharge Instructions for Patients Receiving Chemotherapy  Today you received the following chemotherapy agents: pembrolizumab.  To help prevent nausea and vomiting after your treatment, we encourage you to take your nausea medication as directed.   If you develop nausea and vomiting that is not controlled by your nausea medication, call the clinic.   BELOW ARE SYMPTOMS THAT SHOULD BE REPORTED IMMEDIATELY:  *FEVER GREATER THAN 100.5 F  *CHILLS WITH OR WITHOUT FEVER  NAUSEA AND VOMITING THAT IS NOT CONTROLLED WITH YOUR NAUSEA MEDICATION  *UNUSUAL SHORTNESS OF BREATH  *UNUSUAL BRUISING OR BLEEDING  TENDERNESS IN MOUTH AND THROAT WITH OR WITHOUT PRESENCE OF ULCERS  *URINARY PROBLEMS  *BOWEL PROBLEMS  UNUSUAL RASH Items with * indicate a potential emergency and should be followed up as soon as possible.  Feel free to call the clinic should you have any questions or concerns. The clinic phone number is [phone].  Please show the CHEMO ALERT CARD at check-in to the Emergency Department and triage nurse.   

## 2020-08-24 NOTE — Progress Notes (Signed)
Per Dr Julien Nordmann, ok to use labs from 10/13 for treatment today.

## 2020-08-24 NOTE — Progress Notes (Signed)
Per Dr Julien Nordmann , Hold Alimta today.

## 2020-08-24 NOTE — Progress Notes (Signed)
[facility] [phone]   [phone]  OFFICE PROGRESS NOTE  [name], MD [address] [place]  [place] [address]  DIAGNOSIS: Stage IV (T2b, N3, M1a) non-small cell lung cancer, adenocarcinoma presented with large right upper lobe lung mass in addition to mediastinal and right supraclavicular lymphadenopathy as well as bilateral pulmonary nodules diagnosed in February 2021.  MOLECULAR STUDY by [employer]:  KRASG12C, 4.3%, Binimetinib  [id number], 0.9%, Niraparib, Olaparib, Rucaparib,Talazoparib, Tazemetostat  [id number], 1.7%, None   PRIOR THERAPY: None  CURRENT THERAPY: Systemic chemotherapy with carboplatin for AUC of 5, Alimta 500 mg/M2 and Keytruda 200 mg IV every 3 weeks.  First dose December 31, 2019.  Status post 11 cycles.  INTERVAL HISTORY: Kevin Lambert. 64 y.o. male returns to the clinic today for follow-up visit.  The patient is feeling a little bit better today.  The swelling of the upper and lower extremity is better after starting Lasix.  He denied having any current chest pain but continues to have shortness of breath at baseline increased with exertion.  He denied having any nausea, vomiting, diarrhea or constipation.  He has no headache or visual changes.  Is here today for evaluation before starting cycle #12 of his chemotherapy.  MEDICAL HISTORY: Past Medical History:  Diagnosis Date  . Arthritis   . Depression   . DM (diabetes mellitus) ([place])    type II  . Dyslipidemia   . Dyspnea    unable to walk much - he thinks it is from   . GERD (gastroesophageal reflux disease)   . Hepatitis    Hepatitis C- treated  . History of kidney stones   . HTN (hypertension)   . Neuropathy   . nscl ca dx'd 10/2019  . OSA on CPAP   . PTSD (post-traumatic stress disorder)     ALLERGIES:  is allergic to metformin and related.  MEDICATIONS:  Current Outpatient Medications  Medication Sig Dispense Refill  . albuterol  (VENTOLIN HFA) 108 (90 Base) MCG/ACT inhaler Inhale 2 puffs into the lungs every 4 (four) hours as needed for wheezing or shortness of breath. 18 g 0  . amLODipine (NORVASC) 10 MG tablet Take 10 mg by mouth daily.    . Ascorbic Acid (VITAMIN C) 1000 MG tablet Take 1,000 mg by mouth daily.    Marland Kitchen aspirin EC 81 MG tablet Take 81 mg by mouth daily.    Marland Kitchen atorvastatin (LIPITOR) 10 MG tablet Take 10 mg by mouth daily.    . bisacodyl (DULCOLAX) 5 MG EC tablet Take 5 mg by mouth daily as needed for moderate constipation.    . cholecalciferol (VITAMIN D3) 25 MCG (1000 UNIT) tablet Take 1,000 Units by mouth daily.    . Cyanocobalamin (VITAMIN B 12 PO) Take 1 tablet by mouth daily.    . folic acid (FOLVITE) 1 MG tablet Take 1 tablet (1 mg total) by mouth daily. Pt needs to pick up today and start ASAP . He will pay out of pocket. He is waiting on the [place] to send his folic acid which will be next week. (Patient taking differently: Take 1 mg by mouth daily. ) 7 tablet 0  . furosemide (LASIX) 20 MG tablet Take 1 tablet (20 mg total) by mouth 2 (two) times daily. 20 tablet 0  . glipiZIDE (GLUCOTROL) 5 MG tablet Take 5 mg by mouth daily before breakfast.     . lidocaine-prilocaine (EMLA) cream Apply to Port-A-Cath site 30-60-minute  before treatment. 30 g 0  . magic mouthwash SOLN Take 5 mLs by mouth 4 (four) times daily as needed for mouth pain. Swish and swallow or spit 240 mL 2  . naproxen (NAPROSYN) 500 MG tablet Take 500 mg by mouth 2 (two) times daily with a meal.     . nicotine (NICODERM CQ) 21 mg/24hr patch Place 1 patch (21 mg total) onto the skin daily. 28 patch 3  . nicotine polacrilex (COMMIT) 4 MG lozenge Take 1 lozenge (4 mg total) by mouth as needed for smoking cessation. 108 tablet 3  . potassium chloride SA (KLOR-CON) 20 MEQ tablet Take 1 tablet (20 mEq total) by mouth daily. 10 tablet 0  . pregabalin (LYRICA) 25 MG capsule Take 1 capsule (25 mg total) by mouth daily. 30 capsule 1  . Propylene Glycol  (SYSTANE BALANCE OP) Place 1 drop into both eyes daily.    Marland Kitchen triamterene-hydrochlorothiazide (MAXZIDE-25) 37.5-25 MG tablet Take 1 tablet by mouth daily.     Marland Kitchen umeclidinium-vilanterol (ANORO ELLIPTA) 62.5-25 MCG/INH AEPB Inhale 1 puff into the lungs daily. 60 each 11  . umeclidinium-vilanterol (ANORO ELLIPTA) 62.5-25 MCG/INH AEPB Inhale 1 puff into the lungs daily. 14 each 0  . zinc gluconate 50 MG tablet Take 50 mg by mouth daily.     No current facility-administered medications for this visit.    SURGICAL HISTORY:  Past Surgical History:  Procedure Laterality Date  . BIOPSY OF MEDIASTINAL MASS  12/10/2019   Procedure: BIOPSY OF MEDIASTINAL MASS;  Surgeon: Garner Nash, DO;  Location: [facility];  Service: Pulmonary;;  right upper lobe  . BRONCHIAL NEEDLE ASPIRATION BIOPSY  12/10/2019   Procedure: BRONCHIAL NEEDLE ASPIRATION BIOPSIES;  Surgeon: Garner Nash, DO;  Location: [facility];  Service: Pulmonary;;  . COLONOSCOPY    . IR IMAGING GUIDED PORT INSERTION  12/31/2019  . kidney stone    . URETERAL STENT PLACEMENT    . Ureteral Stent Removed    . VIDEO BRONCHOSCOPY WITH ENDOBRONCHIAL ULTRASOUND N/A 12/10/2019   Procedure: VIDEO BRONCHOSCOPY WITH ENDOBRONCHIAL ULTRASOUND;  Surgeon: Garner Nash, DO;  Location: [facility];  Service: Pulmonary;  Laterality: N/A;    REVIEW OF SYSTEMS:  A comprehensive review of systems was negative except for: Constitutional: positive for fatigue Respiratory: positive for dyspnea on exertion   PHYSICAL EXAMINATION: General appearance: alert, cooperative, fatigued and no distress Head: Normocephalic, without obvious abnormality, atraumatic Neck: no adenopathy, no JVD, supple, symmetrical, trachea midline and thyroid not enlarged, symmetric, no tenderness/mass/nodules Lymph nodes: Cervical, supraclavicular, and axillary nodes normal. Resp: clear to auscultation bilaterally Back: symmetric, no curvature. ROM normal. No CVA tenderness. Cardio:  regular rate and rhythm, S1, S2 normal, no murmur, click, rub or gallop GI: soft, non-tender; bowel sounds normal; no masses,  no organomegaly Extremities: edema 1+ edema  ECOG PERFORMANCE STATUS: 1 - Symptomatic but completely ambulatory  Blood pressure 135/74, pulse 87, temperature (!) 97.5 F (36.4 C), temperature source Tympanic, resp. rate 17, height 5\' 9"  (1.753 m), weight 245 lb (111.1 kg), SpO2 100 %.  LABORATORY DATA: Lab Results  Component Value Date   WBC 6.4 08/19/2020   HGB 9.4 (L) 08/19/2020   HCT 26.8 (L) 08/19/2020   MCV 100.0 08/19/2020   PLT 181 08/19/2020      Chemistry      Component Value Date/Time   NA 137 08/19/2020 1305   K 3.3 (L) 08/19/2020 1305   CL 100 08/19/2020 1305   CO2 30 08/19/2020 1305  BUN 7 (L) 08/19/2020 1305   CREATININE 1.39 (H) 08/19/2020 1305      Component Value Date/Time   CALCIUM 9.8 08/19/2020 1305   ALKPHOS 79 08/19/2020 1305   AST 43 (H) 08/19/2020 1305   ALT 61 (H) 08/19/2020 1305   BILITOT 0.2 (L) 08/19/2020 1305       RADIOGRAPHIC STUDIES: ECHOCARDIOGRAM COMPLETE  Result Date: 08/06/2020    ECHOCARDIOGRAM REPORT   Patient Name:   Kevin Lambert. Date of Exam: 08/06/2020 Medical Rec #:  [id number]       Height:       69.0 in Accession #:    [id number]      Weight:       253.9 lb Date of Birth:  [birth year]       BSA:          2.287 m Patient Age:    75 years        BP:           152/66 mmHg Patient Gender: M               HR:           87 bpm. Exam Location:  [facility] Procedure: 2D Echo, Cardiac Doppler and Color Doppler Indications:    M79.89 Swelling of both upper extremities  History:        Patient has no prior history of Echocardiogram examinations.                 Risk Factors:Hypertension, Diabetes and Current Smoker. Dyspnea                 on exertion. Non-small cell cancer of right lung.  Sonographer:    Diamond Nickel RCS Referring Phys: [id number] [place]  1. Left ventricular ejection  fraction, by estimation, is 55%. The left ventricle has normal function. The left ventricle has no regional wall motion abnormalities. There is moderate left ventricular hypertrophy. Left ventricular diastolic parameters are  consistent with Grade I diastolic dysfunction (impaired relaxation).  2. Right ventricular systolic function is normal. The right ventricular size is normal. Tricuspid regurgitation signal is inadequate for assessing PA pressure.  3. The mitral valve is normal in structure. No evidence of mitral valve regurgitation. No evidence of mitral stenosis.  4. The aortic valve is tricuspid. Aortic valve regurgitation is not visualized. Mild aortic valve sclerosis is present, with no evidence of aortic valve stenosis.  5. Aortic dilatation noted. There is mild dilatation of the aortic root, measuring 38 mm.  6. The inferior vena cava is normal in size with greater than 50% respiratory variability, suggesting right atrial pressure of 3 mmHg. FINDINGS  Left Ventricle: Left ventricular ejection fraction, by estimation, is 55%. The left ventricle has normal function. The left ventricle has no regional wall motion abnormalities. The left ventricular internal cavity size was normal in size. There is moderate left ventricular hypertrophy. Left ventricular diastolic parameters are consistent with Grade I diastolic dysfunction (impaired relaxation). Right Ventricle: The right ventricular size is normal. No increase in right ventricular wall thickness. Right ventricular systolic function is normal. Tricuspid regurgitation signal is inadequate for assessing PA pressure. Left Atrium: Left atrial size was normal in size. Right Atrium: Right atrial size was normal in size. Pericardium: There is no evidence of pericardial effusion. Mitral Valve: The mitral valve is normal in structure. No evidence of mitral valve regurgitation. No evidence of mitral valve stenosis. Tricuspid Valve: The tricuspid valve is  normal in  structure. Tricuspid valve regurgitation is not demonstrated. Aortic Valve: The aortic valve is tricuspid. Aortic valve regurgitation is not visualized. Mild aortic valve sclerosis is present, with no evidence of aortic valve stenosis. Pulmonic Valve: The pulmonic valve was normal in structure. Pulmonic valve regurgitation is not visualized. Aorta: Aortic dilatation noted. There is mild dilatation of the aortic root, measuring 38 mm. Venous: The inferior vena cava is normal in size with greater than 50% respiratory variability, suggesting right atrial pressure of 3 mmHg. IAS/Shunts: No atrial level shunt detected by color flow Doppler.  LEFT VENTRICLE PLAX 2D LVIDd:         4.30 cm  Diastology LVIDs:         2.50 cm  LV e' medial:    7.07 cm/s LV PW:         1.70 cm  LV E/e' medial:  8.4 LV IVS:        1.90 cm  LV e' lateral:   10.00 cm/s LVOT diam:     2.30 cm  LV E/e' lateral: 5.9 LV SV:         93 LV SV Index:   41 LVOT Area:     4.15 cm  RIGHT VENTRICLE RV Basal diam:  3.00 cm RV S prime:     14.40 cm/s TAPSE (M-mode): 2.6 cm LEFT ATRIUM             Index       RIGHT ATRIUM           Index LA diam:        3.60 cm 1.57 cm/m  RA Area:     15.30 cm LA Vol (A2C):   85.2 ml 37.24 ml/m RA Volume:   37.70 ml  16.49 ml/m LA Vol (A4C):   44.2 ml 19.33 ml/m LA Biplane Vol: 61.1 ml 26.72 ml/m  AORTIC VALVE LVOT Vmax:   133.00 cm/s LVOT Vmean:  72.100 cm/s LVOT VTI:    0.225 m  AORTA Ao Root diam: 4.00 cm MITRAL VALVE MV Area (PHT): 3.08 cm    SHUNTS MV Decel Time: 246 msec    Systemic VTI:  0.22 m MV E velocity: 59.10 cm/s  Systemic Diam: 2.30 cm MV A velocity: 97.30 cm/s MV E/A ratio:  0.61 Loralie Champagne MD Electronically signed by Loralie Champagne MD Signature Date/Time: 08/06/2020/5:00:45 PM    Final    VAS [place] UPPER EXTREMITY VENOUS DUPLEX  Result Date: 08/07/2020 UPPER VENOUS STUDY  Indications: Swelling, Edema, and Bilateral arms and hands have been swollen for about 2 months. Patient denies SOB or chest  pains. Risk Factors: Cancer Lung. Comparison Study: None Performing Technologist: Alecia Mackin RVT, RDCS (AE), RDMS  Examination Guidelines: A complete evaluation includes B-mode imaging, spectral Doppler, color Doppler, and power Doppler as needed of all accessible portions of each vessel. Bilateral testing is considered an integral part of a complete examination. Limited examinations for reoccurring indications may be performed as noted.  Right Findings: +----------+------------+---------+-----------+----------+-------+ RIGHT     CompressiblePhasicitySpontaneousPropertiesSummary +----------+------------+---------+-----------+----------+-------+ IJV           Full       Yes       Yes                      +----------+------------+---------+-----------+----------+-------+ Subclavian               Yes       Yes                      +----------+------------+---------+-----------+----------+-------+  Axillary      Full       Yes       Yes                      +----------+------------+---------+-----------+----------+-------+ Brachial      Full       Yes       Yes                      +----------+------------+---------+-----------+----------+-------+ Radial        Full       Yes       Yes                      +----------+------------+---------+-----------+----------+-------+ Ulnar         Full       Yes       Yes                      +----------+------------+---------+-----------+----------+-------+ Cephalic      Full                                          +----------+------------+---------+-----------+----------+-------+ Basilic       Full                                          +----------+------------+---------+-----------+----------+-------+  Left Findings: +----------+------------+---------+-----------+----------+-------+ LEFT      CompressiblePhasicitySpontaneousPropertiesSummary  +----------+------------+---------+-----------+----------+-------+ IJV           Full       Yes       Yes                      +----------+------------+---------+-----------+----------+-------+ Subclavian               Yes       Yes                      +----------+------------+---------+-----------+----------+-------+ Axillary      Full       Yes       Yes                      +----------+------------+---------+-----------+----------+-------+ Brachial      Full       Yes       Yes                      +----------+------------+---------+-----------+----------+-------+ Radial        Full       Yes       Yes                      +----------+------------+---------+-----------+----------+-------+ Ulnar         Full       Yes       Yes                      +----------+------------+---------+-----------+----------+-------+ Cephalic      Full                                          +----------+------------+---------+-----------+----------+-------+  Basilic       Full                                          +----------+------------+---------+-----------+----------+-------+  Findings reported to [facility] email through EPIC at 2:45 pm.  Summary: No evidence of deep vein or superficial vein thrombosis involving the right and left upper extremities. Right: No evidence of superficial vein thrombosis in the upper extremity. No evidence of thrombosis in the subclavian.  Left: No evidence of superficial vein thrombosis in the upper extremity. No evidence of thrombosis in the subclavian.  Bilateral superficial edema noted in both forearms and hands, right > left. *See table(s) above for measurements and observations.  Diagnosing physician: Larae Grooms MD Electronically signed by Larae Grooms MD on 08/07/2020 at 47:08:58 PM.    Final     ASSESSMENT AND PLAN: This is a very pleasant 64 years old African-American male recently diagnosed with a stage IV  non-small cell lung cancer, adenocarcinoma with no actionable mutations presented with large right lower lobe lung mass in addition to mediastinal and right supraclavicular lymphadenopathy as well as bilateral pulmonary nodules diagnosed in February 2021. I explained to the patient that he has incurable condition and all the treatment options will be of palliative nature. The patient has no actionable mutations on the recent molecular studies and he is not a candidate for the [facility] clinical trial. He started systemic chemotherapy with carboplatin, Alimta and Keytruda status post 11 cycles.  Starting from cycle #5 the patient is on maintenance treatment with Alimta and Keytruda every 3 weeks. The patient has been tolerating this treatment well with no concerning adverse effects. I recommended for him to proceed with cycle #12 today as planned. I will see him back for follow-up visit in 3 weeks for evaluation with repeat CT scan of the chest, abdomen and pelvis for restaging of his disease. For the upper and lower extremity edema, he will continue his current treatment with Lasix. For the hypertension he was advised to monitor his blood pressure closely at home and he will take his blood pressure medication as prescribed. The patient was advised to call immediately if he has any concerning symptoms in the interval. The patient voices understanding of current disease status and treatment options and is in agreement with the current care plan.  All questions were answered. The patient knows to call the clinic with any problems, questions or concerns. We can certainly see the patient much sooner if necessary.  Disclaimer: This note was dictated with voice recognition software. Similar sounding words can inadvertently be transcribed and may not be corrected upon review.

## 2020-09-04 ENCOUNTER — Ambulatory Visit: Payer: Self-pay | Admitting: Pulmonary Disease

## 2020-09-05 ENCOUNTER — Other Ambulatory Visit (HOSPITAL_COMMUNITY)
Admission: RE | Admit: 2020-09-05 | Discharge: 2020-09-05 | Disposition: A | Discharge status: Home / Self Care | Payer: No Typology Code available for payment source | Source: Ambulatory Visit | Attending: Cardiovascular Disease | Admitting: Cardiovascular Disease

## 2020-09-05 DIAGNOSIS — Z01812 Encounter for preprocedural laboratory examination: Secondary | ICD-10-CM | POA: Diagnosis present

## 2020-09-05 DIAGNOSIS — Z20822 Contact with and (suspected) exposure to covid-19: Secondary | ICD-10-CM | POA: Diagnosis not present

## 2020-09-06 LAB — SARS CORONAVIRUS 2 (TAT 6-24 HRS): SARS Coronavirus 2: NEGATIVE

## 2020-09-06 NOTE — Progress Notes (Deleted)
Cardiology Office Note:   Date:  09/06/2020  NAME:  Kevin Lambert.    MRN: [id number] DOB:  November 04, 1956   PCP:  [name], MD  Cardiologist:  No primary care provider on file.  Electrophysiologist:  None   Referring MD: [name], MD   No chief complaint on file. ***  History of Present Illness:   Kevin Arguijo. is a 64 y.o. male with a hx of Stage IV NSCLC, DM, HTN, HLD, CKD3 who is being seen today for the evaluation of edema at the request of [name], MD. Albumin low 2.7 and has been quite low for some time. IVC is collapsing on echo.   Problem List 1. Stage IV NSCLC -adenocarcinoma  -on palliative chemo 2. DM 3. HTN 4. HLD 5. CKD 3  Past Medical History: Past Medical History:  Diagnosis Date  . Arthritis   . Depression   . DM (diabetes mellitus) ([place])    type II  . Dyslipidemia   . Dyspnea    unable to walk much - he thinks it is from   . GERD (gastroesophageal reflux disease)   . Hepatitis    Hepatitis C- treated  . History of kidney stones   . HTN (hypertension)   . Neuropathy   . nscl ca dx'd 10/2019  . OSA on CPAP   . PTSD (post-traumatic stress disorder)     Past Surgical History: Past Surgical History:  Procedure Laterality Date  . BIOPSY OF MEDIASTINAL MASS  12/10/2019   Procedure: BIOPSY OF MEDIASTINAL MASS;  Surgeon: Garner Nash, DO;  Location: [facility];  Service: Pulmonary;;  right upper lobe  . BRONCHIAL NEEDLE ASPIRATION BIOPSY  12/10/2019   Procedure: BRONCHIAL NEEDLE ASPIRATION BIOPSIES;  Surgeon: Garner Nash, DO;  Location: [facility];  Service: Pulmonary;;  . COLONOSCOPY    . IR IMAGING GUIDED PORT INSERTION  12/31/2019  . kidney stone    . URETERAL STENT PLACEMENT    . Ureteral Stent Removed    . VIDEO BRONCHOSCOPY WITH ENDOBRONCHIAL ULTRASOUND N/A 12/10/2019   Procedure: VIDEO BRONCHOSCOPY WITH ENDOBRONCHIAL ULTRASOUND;  Surgeon: Garner Nash, DO;  Location: [facility];  Service: Pulmonary;  Laterality:  N/A;    Current Medications: No outpatient medications have been marked as taking for the 09/08/20 encounter (Appointment) with O'Neal, Cassie Freer, MD.     Allergies:    Metformin and related   Social History: Social History   Socioeconomic History  . Marital status: Single    Spouse name: Not on file  . Number of children: Not on file  . Years of education: Not on file  . Highest education level: Not on file  Occupational History  . Not on file  Tobacco Use  . Smoking status: Current Every Day Smoker    Packs/day: 0.60    Years: 50.00    Pack years: 30.00    Types: Cigarettes  . Smokeless tobacco: Never Used  . Tobacco comment: Pt. stated he is still trying to quit.  Vaping Use  . Vaping Use: Never used  Substance and Sexual Activity  . Alcohol use: Yes    Alcohol/week: 6.0 standard drinks    Types: 6 Cans of beer per week  . Drug use: Not Currently  . Sexual activity: Not on file  Other Topics Concern  . Not on file  Social History Narrative  . Not on file   Social Determinants of Health   Financial Resource Strain:   .  Difficulty of Paying Living Expenses: Not on file  Food Insecurity:   . Worried About Charity fundraiser in the Last Year: Not on file  . Ran Out of Food in the Last Year: Not on file  Transportation Needs:   . Lack of Transportation (Medical): Not on file  . Lack of Transportation (Non-Medical): Not on file  Physical Activity:   . Days of Exercise per Week: Not on file  . Minutes of Exercise per Session: Not on file  Stress:   . Feeling of Stress : Not on file  Social Connections:   . Frequency of Communication with Friends and Family: Not on file  . Frequency of Social Gatherings with Friends and Family: Not on file  . Attends Religious Services: Not on file  . Active Member of Clubs or Organizations: Not on file  . Attends Archivist Meetings: Not on file  . Marital Status: Not on file     Family History: The  patient's ***family history includes Diabetes in his father; Liver disease in his mother.  ROS:   All other ROS reviewed and negative. Pertinent positives noted in the HPI.     EKGs/Labs/Other Studies Reviewed:   The following studies were personally reviewed by me today:  EKG:  EKG is *** ordered today.  The ekg ordered today demonstrates ***, and was personally reviewed by me.   TTE 08/06/2020 1. Left ventricular ejection fraction, by estimation, is 55%. The left  ventricle has normal function. The left ventricle has no regional wall  motion abnormalities. There is moderate left ventricular hypertrophy. Left  ventricular diastolic parameters are  consistent with Grade I diastolic dysfunction (impaired relaxation).  2. Right ventricular systolic function is normal. The right ventricular  size is normal. Tricuspid regurgitation signal is inadequate for assessing  PA pressure.  3. The mitral valve is normal in structure. No evidence of mitral valve  regurgitation. No evidence of mitral stenosis.  4. The aortic valve is tricuspid. Aortic valve regurgitation is not  visualized. Mild aortic valve sclerosis is present, with no evidence of  aortic valve stenosis.  5. Aortic dilatation noted. There is mild dilatation of the aortic root,  measuring 38 mm.  6. The inferior vena cava is normal in size with greater than 50%  respiratory variability, suggesting right atrial pressure of 3 mmHg.   Recent Labs: 01/28/2020: Magnesium 1.5 08/19/2020: TSH 0.882 08/24/2020: ALT 34; BUN 22; Creatinine 1.84; Hemoglobin 9.1; Platelet Count 388; Potassium 3.9; Sodium 136   Recent Lipid Panel No results found for: CHOL, TRIG, HDL, CHOLHDL, VLDL, LDLCALC, LDLDIRECT  Physical Exam:   VS:  There were no vitals taken for this visit.   Wt Readings from Last 3 Encounters:  08/24/20 245 lb (111.1 kg)  08/19/20 241 lb 14.4 oz (109.7 kg)  08/10/20 244 lb 3.2 oz (110.8 kg)    General: Well nourished,  well developed, in no acute distress Heart: Atraumatic, normal size  Eyes: PEERLA, EOMI  Neck: Supple, no JVD Endocrine: No thryomegaly Cardiac: Normal S1, S2; RRR; no murmurs, rubs, or gallops Lungs: Clear to auscultation bilaterally, no wheezing, rhonchi or rales  Abd: Soft, nontender, no hepatomegaly  Ext: No edema, pulses 2+ Musculoskeletal: No deformities, BUE and BLE strength normal and equal Skin: Warm and dry, no rashes   Neuro: Alert and oriented to person, place, time, and situation, CNII-XII grossly intact, no focal deficits  Psych: Normal mood and affect   ASSESSMENT:   Delanna Notice  Kevin Bonito. is a 64 y.o. male who presents for the following: No diagnosis found.  PLAN:   There are no diagnoses linked to this encounter.  Disposition: No follow-ups on file.  Medication Adjustments/Labs and Tests Ordered: Current medicines are reviewed at length with the patient today.  Concerns regarding medicines are outlined above.  No orders of the defined types were placed in this encounter.  No orders of the defined types were placed in this encounter.   There are no Patient Instructions on file for this visit.   Time Spent with Patient: I have spent a total of *** minutes with patient reviewing hospital notes, telemetry, EKGs, labs and examining the patient as well as establishing an assessment and plan that was discussed with the patient.  > 50% of time was spent in direct patient care.  Signed, Addison Naegeli. Audie Box, [place]  [address] [place], [place] [address] [phone]  09/06/2020 5:22 PM

## 2020-09-07 NOTE — Progress Notes (Signed)
[facility] OFFICE PROGRESS NOTE  [name], [place]  [place] [address]  DIAGNOSIS: Stage IV (T2b, N3, M1a) non-small cell lung cancer, adenocarcinoma presented with large right upper lobe lung mass in addition to mediastinal and right supraclavicular lymphadenopathy as well as bilateral pulmonary nodules diagnosed in February 2021.  MOLECULAR STUDY by [employer]:  KRASG12C,4.3%,Binimetinib  [id number],0.9%,Niraparib, Olaparib, Rucaparib,Talazoparib, Tazemetostat  [id number],8.3%,JASN  PRIOR THERAPY: None  CURRENT THERAPY: Systemic chemotherapy with carboplatin for AUC of 5, Alimta 500 mg/M2 and Keytruda 200 mg IV every 3 weeks. First dose December 31, 2019.Status post12cycles. Starting from cycle #5, he will be on maintenance Alimta and Keytruda.  INTERVAL HISTORY: Kevin Lambert. 64 y.o. male returns to clinic today for a follow-up visit.  The patient is feeling well today without any concerning complaints.  The patient actually feels better today due to improvement in his bilateral upper extremity swelling. He was given a prescription for lasix. He is scheduled to meet with his new cardiologist next week on 09/24/2020. His only other complaint today is dizziness when he moves his head to fast. He denies any visual changes or headaches.  Otherwise, the patient tolerated his last cycle of treatment well without any adverse side effects.  The patient denies any recent fever, chills, or night sweats.  He reports weight loss likely secondary to his Lasix use.  He reports his baseline dyspnea on exertion for which he uses an inhaler.  He denies any coughing, chest pain, or hemoptysis.  He denies any nausea, vomiting, or diarrhea. He occasionally experiences constipation.  The patient recently had a restaging CT scan performed.  The patient is here today for evaluation and to review his scan results before starting cycle #13.    MEDICAL  HISTORY: Past Medical History:  Diagnosis Date  . Arthritis   . Depression   . DM (diabetes mellitus) ([place])    type II  . Dyslipidemia   . Dyspnea    unable to walk much - he thinks it is from   . GERD (gastroesophageal reflux disease)   . Hepatitis    Hepatitis C- treated  . History of kidney stones   . HTN (hypertension)   . Neuropathy   . nscl ca dx'd 10/2019  . OSA on CPAP   . PTSD (post-traumatic stress disorder)     ALLERGIES:  is allergic to metformin and related.  MEDICATIONS:  Current Outpatient Medications  Medication Sig Dispense Refill  . albuterol (VENTOLIN HFA) 108 (90 Base) MCG/ACT inhaler Inhale 2 puffs into the lungs every 4 (four) hours as needed for wheezing or shortness of breath. 18 g 0  . amLODipine (NORVASC) 10 MG tablet Take 10 mg by mouth daily.    . Ascorbic Acid (VITAMIN C) 1000 MG tablet Take 1,000 mg by mouth daily.    Marland Kitchen aspirin EC 81 MG tablet Take 81 mg by mouth daily.    Marland Kitchen atorvastatin (LIPITOR) 10 MG tablet Take 10 mg by mouth daily.    . bisacodyl (DULCOLAX) 5 MG EC tablet Take 5 mg by mouth daily as needed for moderate constipation.    . cholecalciferol (VITAMIN D3) 25 MCG (1000 UNIT) tablet Take 1,000 Units by mouth daily.    . Cyanocobalamin (VITAMIN B 12 PO) Take 1 tablet by mouth daily.    . folic acid (FOLVITE) 1 MG tablet Take 1 tablet (1 mg total) by mouth daily. Pt needs to pick up today and start ASAP .  He will pay out of pocket. He is waiting on the [place] to send his folic acid which will be next week. (Patient taking differently: Take 1 mg by mouth daily. ) 7 tablet 0  . furosemide (LASIX) 20 MG tablet Take 1 tablet (20 mg total) by mouth 2 (two) times daily. 20 tablet 0  . glipiZIDE (GLUCOTROL) 5 MG tablet Take 5 mg by mouth daily before breakfast.     . lidocaine-prilocaine (EMLA) cream Apply to Port-A-Cath site 30-60-minute before treatment. 30 g 0  . magic mouthwash SOLN Take 5 mLs by mouth 4 (four) times daily as needed for mouth  pain. Swish and swallow or spit 240 mL 2  . naproxen (NAPROSYN) 500 MG tablet Take 500 mg by mouth 2 (two) times daily with a meal.     . nicotine (NICODERM CQ) 21 mg/24hr patch Place 1 patch (21 mg total) onto the skin daily. 28 patch 3  . nicotine polacrilex (COMMIT) 4 MG lozenge Take 1 lozenge (4 mg total) by mouth as needed for smoking cessation. 108 tablet 3  . potassium chloride SA (KLOR-CON) 20 MEQ tablet Take 1 tablet (20 mEq total) by mouth daily. 10 tablet 0  . pregabalin (LYRICA) 25 MG capsule Take 1 capsule (25 mg total) by mouth daily. 30 capsule 1  . Propylene Glycol (SYSTANE BALANCE OP) Place 1 drop into both eyes daily.    Marland Kitchen triamterene-hydrochlorothiazide (MAXZIDE-25) 37.5-25 MG tablet Take 1 tablet by mouth daily.     Marland Kitchen umeclidinium-vilanterol (ANORO ELLIPTA) 62.5-25 MCG/INH AEPB Inhale 1 puff into the lungs daily. 60 each 11  . umeclidinium-vilanterol (ANORO ELLIPTA) 62.5-25 MCG/INH AEPB Inhale 1 puff into the lungs daily. 14 each 0  . zinc gluconate 50 MG tablet Take 50 mg by mouth daily.     No current facility-administered medications for this visit.    SURGICAL HISTORY:  Past Surgical History:  Procedure Laterality Date  . BIOPSY OF MEDIASTINAL MASS  12/10/2019   Procedure: BIOPSY OF MEDIASTINAL MASS;  Surgeon: Garner Nash, DO;  Location: [facility];  Service: Pulmonary;;  right upper lobe  . BRONCHIAL NEEDLE ASPIRATION BIOPSY  12/10/2019   Procedure: BRONCHIAL NEEDLE ASPIRATION BIOPSIES;  Surgeon: Garner Nash, DO;  Location: [facility];  Service: Pulmonary;;  . COLONOSCOPY    . IR IMAGING GUIDED PORT INSERTION  12/31/2019  . kidney stone    . URETERAL STENT PLACEMENT    . Ureteral Stent Removed    . VIDEO BRONCHOSCOPY WITH ENDOBRONCHIAL ULTRASOUND N/A 12/10/2019   Procedure: VIDEO BRONCHOSCOPY WITH ENDOBRONCHIAL ULTRASOUND;  Surgeon: Garner Nash, DO;  Location: [facility];  Service: Pulmonary;  Laterality: N/A;    REVIEW OF SYSTEMS:   Review of  Systems  Constitutional:Positive for fatigue.Negative for appetite change, chills, fever and unexpected weight change.  HENT:Negative for mouth sores, sore throat and trouble swallowing.  Eyes: Negative for eye problems and icterus.  Respiratory:Positive for baseline shortness of breath with exertion.Negative for cough, hemoptysis, and wheezing.  Cardiovascular:Improvement in upper extremity swelling. Negative for chest pain. Gastrointestinal:Positive for occasional constipation. Negative for diarrhea, vomiting, ornausea. Genitourinary: Negative for bladder incontinence, difficulty urinating, dysuria, frequency and hematuria.  Musculoskeletal: Negative for back pain, gait problem, neck pain and neck stiffness.  Skin:Positive for darkening of the skin in the left lower extremity with associated dry skin. Neurological: Positive for one episode of dizziness this AM. Negative for extremity weakness, gait problem, headaches, light-headedness and seizures.  Hematological: Negative for adenopathy. Does not bruise/bleed  easily.  Psychiatric/Behavioral: Negative for confusion, depression and sleep disturbance. The patient is not nervous/anxious.   PHYSICAL EXAMINATION:  Blood pressure (!) 173/77, pulse 96, temperature 98.1 F (36.7 C), temperature source Tympanic, resp. rate 18, height 5\' 9"  (1.753 m), weight 236 lb 1.6 oz (107.1 kg), SpO2 100 %.  ECOG PERFORMANCE STATUS: 1 - Symptomatic but completely ambulatory  Physical Exam  Head: Normocephalic and atraumatic.  Mouth/Throat: Oropharynx is clear and moist. No oropharyngeal exudate.  Eyes:Positive for tearing eyes.Conjunctivae are normal. Right eye exhibits no discharge. Left eye exhibits no discharge. No scleral icterus.  Neck: Normal range of motion. Neck supple.  Cardiovascular: Normal rate, regular rhythm, normal heart sounds and intact distal pulses.  Pulmonary/Chest: Effort normal and breath sounds normal. No respiratory  distress. No wheezes. No rales.  Abdominal: Soft. Bowel sounds are normal. Exhibits no distension and no mass. There is no tenderness.  Musculoskeletal: Positive for some bilateral lower extremity swelling.normal range of motion.  Lymphadenopathy:  No cervical adenopathy.  Neurological: Alert and oriented to person, place, and time. Exhibits normal muscle tone. Gait normal. Coordination normal.  Skin:Positive for darkening of the skin in the left lower extremity with associated dry skin.Not diaphoretic. No erythema. No pallor.  Psychiatric: Mood, memory and judgment normal.  Vitals reviewed.  LABORATORY DATA: Lab Results  Component Value Date   WBC 6.4 09/15/2020   HGB 10.4 (L) 09/15/2020   HCT 30.2 (L) 09/15/2020   MCV 102.4 (H) 09/15/2020   PLT 239 09/15/2020      Chemistry      Component Value Date/Time   NA 138 09/15/2020 1013   K 4.2 09/15/2020 1013   CL 105 09/15/2020 1013   CO2 27 09/15/2020 1013   BUN 30 (H) 09/15/2020 1013   CREATININE 2.24 (H) 09/15/2020 1013      Component Value Date/Time   CALCIUM 9.9 09/15/2020 1013   ALKPHOS 80 09/15/2020 1013   AST 21 09/15/2020 1013   ALT 25 09/15/2020 1013   BILITOT 0.3 09/15/2020 1013       RADIOGRAPHIC STUDIES:  CT Abdomen Pelvis Wo Contrast  Result Date: 09/11/2020 CLINICAL DATA:  Non-small cell lung cancer restaging. Chemotherapy and immunotherapy ongoing. EXAM: CT CHEST, ABDOMEN AND PELVIS WITHOUT CONTRAST TECHNIQUE: Multidetector CT imaging of the chest, abdomen and pelvis was performed following the standard protocol without IV contrast. COMPARISON:  07/08/2020 FINDINGS: CT CHEST FINDINGS Cardiovascular: Right Port-A-Cath tip: Cavoatrial junction. Coronary, aortic arch, and branch vessel atherosclerotic vascular disease. Mediastinum/Nodes: Indistinctly marginated right paratracheal lymph node 1.0 cm in short axis on image 19 of series 2, previously 1.2 cm by my measurements. Lungs/Pleura: New moderate left  and trace right pleural effusions with passive atelectasis. The right upper lobe mass abutting the major fissure measures 3.7 by 2.9 cm on image 54 series 6 when measured in the same fashion as on the prior exam, previously measuring 3.7 by 3.1 cm. Subjectively the lesion is unchanged in size and contour, and continues to obstruct in adjacent subsegmental bronchus posteriorly in the right upper lobe. There is new left lower lobe atelectasis along the major fissure. Stable small foci of pleural based thickening of the major fissure along the right middle lobe posteriorly for example on images 64-77 of series 6. Musculoskeletal: Thoracic spondylosis and degenerative disc disease. CT ABDOMEN PELVIS FINDINGS Hepatobiliary: Unremarkable noncontrast CT appearance. Pancreas: Unremarkable Spleen: Unremarkable Adrenals/Urinary Tract: 1.6 by 2.6 cm right adrenal nodule, precontrast density of 4 Hounsfield units, compatible with adenoma. 1.6 by 1.2  cm left adrenal nodule, internal density 17 Hounsfield units, nonspecific based on density. Neither of these nodules was hypermetabolic on prior PET-CT, and accordingly the appearance is likely benign. Vascular calcifications in both renal hila. Potential nonobstructive nephrolithiasis including a suspected 0.7 cm left kidney lower pole calculus on image 69 of series 2 which appears to have migrated from the upper pole. No hydronephrosis or hydroureter. Urinary bladder unremarkable. Increased bilateral perirenal stranding extending adjacent to the ureters as well. Stomach/Bowel: mild circumferential rectal wall thickening may be from nondistention. Otherwise unremarkable. Vascular/Lymphatic: Aortoiliac atherosclerotic vascular disease. No pathologic adenopathy is identified. Reproductive: The calcified vas deferens. Other: Mild nonspecific presacral edema. Musculoskeletal: Small direct right inguinal hernia containing adipose tissue. Small lipoma along the left external oblique  muscle posterolaterally on image 67 of series 2, unchanged. Lumbar spondylosis and degenerative disc disease causing impingement at L3-4 and L4-5. IMPRESSION: 1. Stable size and contour of the right upper lobe mass abutting the major fissure, with continued obstruction of adjacent subsegmental bronchus posteriorly in the right upper lobe. 2. New moderate left and trace right pleural effusions with passive atelectasis. 3. Increased bilateral perirenal stranding extending adjacent to the ureters as well as mild nonspecific presacral edema. 4. Other imaging findings of potential clinical significance: Coronary, aortic arch, and branch vessel atherosclerotic vascular disease. Right adrenal adenoma. Potential nonobstructive nephrolithiasis including a 0.7 cm left kidney lower pole calculus which appears to have migrated from the upper pole. Small direct right inguinal hernia containing adipose tissue. Lumbar spondylosis and degenerative disc disease causing impingement at L3-4 and L4-5. Mild nonspecific presacral edema. 5. Aortic atherosclerosis. Aortic Atherosclerosis (ICD10-I70.0). Electronically Signed   By: Van Clines M.D.   On: 09/11/2020 16:22   CT Chest Wo Contrast  Result Date: 09/11/2020 CLINICAL DATA:  Non-small cell lung cancer restaging. Chemotherapy and immunotherapy ongoing. EXAM: CT CHEST, ABDOMEN AND PELVIS WITHOUT CONTRAST TECHNIQUE: Multidetector CT imaging of the chest, abdomen and pelvis was performed following the standard protocol without IV contrast. COMPARISON:  07/08/2020 FINDINGS: CT CHEST FINDINGS Cardiovascular: Right Port-A-Cath tip: Cavoatrial junction. Coronary, aortic arch, and branch vessel atherosclerotic vascular disease. Mediastinum/Nodes: Indistinctly marginated right paratracheal lymph node 1.0 cm in short axis on image 19 of series 2, previously 1.2 cm by my measurements. Lungs/Pleura: New moderate left and trace right pleural effusions with passive atelectasis. The  right upper lobe mass abutting the major fissure measures 3.7 by 2.9 cm on image 54 series 6 when measured in the same fashion as on the prior exam, previously measuring 3.7 by 3.1 cm. Subjectively the lesion is unchanged in size and contour, and continues to obstruct in adjacent subsegmental bronchus posteriorly in the right upper lobe. There is new left lower lobe atelectasis along the major fissure. Stable small foci of pleural based thickening of the major fissure along the right middle lobe posteriorly for example on images 64-77 of series 6. Musculoskeletal: Thoracic spondylosis and degenerative disc disease. CT ABDOMEN PELVIS FINDINGS Hepatobiliary: Unremarkable noncontrast CT appearance. Pancreas: Unremarkable Spleen: Unremarkable Adrenals/Urinary Tract: 1.6 by 2.6 cm right adrenal nodule, precontrast density of 4 Hounsfield units, compatible with adenoma. 1.6 by 1.2 cm left adrenal nodule, internal density 17 Hounsfield units, nonspecific based on density. Neither of these nodules was hypermetabolic on prior PET-CT, and accordingly the appearance is likely benign. Vascular calcifications in both renal hila. Potential nonobstructive nephrolithiasis including a suspected 0.7 cm left kidney lower pole calculus on image 69 of series 2 which appears to have migrated from the upper  pole. No hydronephrosis or hydroureter. Urinary bladder unremarkable. Increased bilateral perirenal stranding extending adjacent to the ureters as well. Stomach/Bowel: mild circumferential rectal wall thickening may be from nondistention. Otherwise unremarkable. Vascular/Lymphatic: Aortoiliac atherosclerotic vascular disease. No pathologic adenopathy is identified. Reproductive: The calcified vas deferens. Other: Mild nonspecific presacral edema. Musculoskeletal: Small direct right inguinal hernia containing adipose tissue. Small lipoma along the left external oblique muscle posterolaterally on image 67 of series 2, unchanged. Lumbar  spondylosis and degenerative disc disease causing impingement at L3-4 and L4-5. IMPRESSION: 1. Stable size and contour of the right upper lobe mass abutting the major fissure, with continued obstruction of adjacent subsegmental bronchus posteriorly in the right upper lobe. 2. New moderate left and trace right pleural effusions with passive atelectasis. 3. Increased bilateral perirenal stranding extending adjacent to the ureters as well as mild nonspecific presacral edema. 4. Other imaging findings of potential clinical significance: Coronary, aortic arch, and branch vessel atherosclerotic vascular disease. Right adrenal adenoma. Potential nonobstructive nephrolithiasis including a 0.7 cm left kidney lower pole calculus which appears to have migrated from the upper pole. Small direct right inguinal hernia containing adipose tissue. Lumbar spondylosis and degenerative disc disease causing impingement at L3-4 and L4-5. Mild nonspecific presacral edema. 5. Aortic atherosclerosis. Aortic Atherosclerosis (ICD10-I70.0). Electronically Signed   By: Van Clines M.D.   On: 09/11/2020 16:22     ASSESSMENT/PLAN:  This is a very pleasant 64 year old African-American male diagnosed with stage IV non-small cell lung cancer, adenocarcinoma. He presented with a large right upper lobe lung mass with right hilar, subcarinal, paratracheal lymphadenopathy,and right supraclavicular lymphadenopathy. He also presented with bilateral pulmonary nodules. He was diagnosed in February 2021. He does not have any actionable mutations.  The patient is currently undergoing palliative systemic chemotherapy with carboplatin for an AUC of 5, Alimta 500 mg/m, Keytruda 200 mg IV every 3 weeks. He is status post12cyclesand he tolerated it well without any adverse side effects. Starting from cycle #5, the patient has been on maintenance alimta and [place].  The patient recently had a restaging CT scan performed. Dr. Julien Nordmann  personally and independently reviewed the scan discussed the results with the patient today. The patient did not show any evidence for disease progression. Dr. Julien Nordmann recommends that he continue on the same treatment at the same dose.   The patient will proceed with cycle #13 today scheduled; however, due to his elevated creatinine, he will only receive single agent immunotherapy with Keytruda today.   We will see him back for follow-up visit in 3 weeks for evaluation before starting cycle #14.  The patient will follow up with cardiology next week as scheduled.  The patient was given a copy of his echocardiogram to share with his cardiologist.  I encouraged the patient to follow-up with his PCP regarding his occasional dizziness when he moves his head to quickly.   The patient's blood pressure was elevated today. He did not take his anti-hypertensives yet due to being in a rush. He was instructed to take them upon returning home. Denies any headaches or chest pain.   The patient was advised to call immediately if he has any concerning symptoms in the interval. The patient voices understanding of current disease status and treatment options and is in agreement with the current care plan. All questions were answered. The patient knows to call the clinic with any problems, questions or concerns. We can certainly see the patient much sooner if necessary      Orders Placed This  Encounter  Procedures  . TSH    Standing Status:   Standing    Number of Occurrences:   18    Standing Expiration Date:   09/15/2021     Andrew Blasius L Oree Hislop, PA-C 09/15/20  ADDENDUM: Hematology/Oncology Attending: I had a face-to-face encounter with the patient today.  I recommended his care plan.  This is a very pleasant 64 years old African-American male with a stage IV non-small cell lung cancer, adenocarcinoma.  The patient status post induction treatment with carboplatin, Alimta and Keytruda followed by  maintenance treatment with Alimta and Keytruda every 3 weeks status post 12 cycles.  He has been tolerating his treatment well with no concerning adverse effects except for fatigue and swelling of the lower extremity that significantly improved with diuresis. The patient presented today with repeat CT scan of the chest, abdomen pelvis for restaging of his disease. His scan showed no concerning findings for disease progression. I recommended for the patient to continue his current treatment with maintenance single agent Keytruda.  We will hold his Alimta for the cycle because of the renal insufficiency which is partially secondary to dehydration from the diuresis. The patient will proceed with cycle #13 today. He will come back for follow-up visit in 3 weeks for evaluation before the next cycle of his treatment. The patient was advised to call immediately if he has any concerning symptoms in the interval.  Disclaimer: This note was dictated with voice recognition software. Similar sounding words can inadvertently be transcribed and may be missed upon review. Eilleen Kempf, MD 09/15/20

## 2020-09-08 ENCOUNTER — Ambulatory Visit: Payer: No Typology Code available for payment source | Admitting: Cardiovascular Disease

## 2020-09-08 DIAGNOSIS — I10 Essential (primary) hypertension: Secondary | ICD-10-CM

## 2020-09-08 DIAGNOSIS — R601 Generalized edema: Secondary | ICD-10-CM

## 2020-09-08 DIAGNOSIS — E782 Mixed hyperlipidemia: Secondary | ICD-10-CM

## 2020-09-09 ENCOUNTER — Ambulatory Visit: Payer: Self-pay | Admitting: Adult Health

## 2020-09-11 ENCOUNTER — Ambulatory Visit (HOSPITAL_COMMUNITY)
Admission: RE | Admit: 2020-09-11 | Discharge: 2020-09-11 | Disposition: A | Discharge status: Home / Self Care | Payer: No Typology Code available for payment source | Source: Ambulatory Visit | Attending: Internal Medicine | Admitting: Internal Medicine

## 2020-09-11 DIAGNOSIS — C3490 Malignant neoplasm of unspecified part of unspecified bronchus or lung: Secondary | ICD-10-CM | POA: Insufficient documentation

## 2020-09-11 MED ORDER — IOHEXOL 9 MG/ML PO SOLN: 1000.0000 mL | ORAL | Status: AC

## 2020-09-11 MED ORDER — IOHEXOL 9 MG/ML PO SOLN: 500.0000 mL | ORAL | Status: AC

## 2020-09-11 MED ORDER — IOHEXOL 9 MG/ML PO SOLN
ORAL | Status: AC
  Administered 2020-09-11: 500 mL via ORAL
  Filled 2020-09-11: qty 1000

## 2020-09-15 ENCOUNTER — Inpatient Hospital Stay: Payer: No Typology Code available for payment source

## 2020-09-15 ENCOUNTER — Other Ambulatory Visit: Payer: Self-pay

## 2020-09-15 ENCOUNTER — Inpatient Hospital Stay: Payer: No Typology Code available for payment source | Attending: Internal Medicine

## 2020-09-15 ENCOUNTER — Encounter: Payer: Self-pay | Admitting: Physician Assistant

## 2020-09-15 ENCOUNTER — Inpatient Hospital Stay (HOSPITAL_BASED_OUTPATIENT_CLINIC_OR_DEPARTMENT_OTHER): Payer: No Typology Code available for payment source | Admitting: Physician Assistant

## 2020-09-15 VITALS — BP 173/77 | HR 96 | Temp 98.1°F | Resp 18 | Ht 69.0 in | Wt 236.1 lb

## 2020-09-15 DIAGNOSIS — Z5112 Encounter for antineoplastic immunotherapy: Secondary | ICD-10-CM

## 2020-09-15 DIAGNOSIS — C3491 Malignant neoplasm of unspecified part of right bronchus or lung: Secondary | ICD-10-CM

## 2020-09-15 DIAGNOSIS — Z95828 Presence of other vascular implants and grafts: Secondary | ICD-10-CM

## 2020-09-15 DIAGNOSIS — C3411 Malignant neoplasm of upper lobe, right bronchus or lung: Secondary | ICD-10-CM | POA: Diagnosis present

## 2020-09-15 DIAGNOSIS — Z79899 Other long term (current) drug therapy: Secondary | ICD-10-CM | POA: Diagnosis not present

## 2020-09-15 LAB — CMP (CANCER CENTER ONLY)
ALT: 25 U/L (ref 0–44)
AST: 21 U/L (ref 15–41)
Albumin: 3.2 g/dL — ABNORMAL LOW (ref 3.5–5.0)
Alkaline Phosphatase: 80 U/L (ref 38–126)
Anion gap: 6 (ref 5–15)
BUN: 30 mg/dL — ABNORMAL HIGH (ref 8–23)
CO2: 27 mmol/L (ref 22–32)
Calcium: 9.9 mg/dL (ref 8.9–10.3)
Chloride: 105 mmol/L (ref 98–111)
Creatinine: 2.24 mg/dL — ABNORMAL HIGH (ref 0.61–1.24)
GFR, Estimated: 32 mL/min — ABNORMAL LOW (ref >60)
Glucose, Bld: 103 mg/dL — ABNORMAL HIGH (ref 70–99)
Potassium: 4.2 mmol/L (ref 3.5–5.1)
Sodium: 138 mmol/L (ref 135–145)
Total Bilirubin: 0.3 mg/dL (ref 0.3–1.2)
Total Protein: 6.9 g/dL (ref 6.5–8.1)

## 2020-09-15 LAB — CBC WITH DIFFERENTIAL (CANCER CENTER ONLY)
Abs Immature Granulocytes: 0.02 10*3/uL (ref 0.00–0.07)
Basophils Absolute: 0 10*3/uL (ref 0.0–0.1)
Basophils Relative: 1 %
Eosinophils Absolute: 0.3 10*3/uL (ref 0.0–0.5)
Eosinophils Relative: 4 %
HCT: 30.2 % — ABNORMAL LOW (ref 39.0–52.0)
Hemoglobin: 10.4 g/dL — ABNORMAL LOW (ref 13.0–17.0)
Immature Granulocytes: 0 %
Lymphocytes Relative: 30 %
Lymphs Abs: 2 10*3/uL (ref 0.7–4.0)
MCH: 35.3 pg — ABNORMAL HIGH (ref 26.0–34.0)
MCHC: 34.4 g/dL (ref 30.0–36.0)
MCV: 102.4 fL — ABNORMAL HIGH (ref 80.0–100.0)
Monocytes Absolute: 0.9 10*3/uL (ref 0.1–1.0)
Monocytes Relative: 13 %
Neutro Abs: 3.3 10*3/uL (ref 1.7–7.7)
Neutrophils Relative %: 52 %
Platelet Count: 239 10*3/uL (ref 150–400)
RBC: 2.95 MIL/uL — ABNORMAL LOW (ref 4.22–5.81)
RDW: 15.4 % (ref 11.5–15.5)
WBC Count: 6.4 10*3/uL (ref 4.0–10.5)
nRBC: 0 % (ref 0.0–0.2)

## 2020-09-15 LAB — TSH: TSH: 1.279 u[IU]/mL (ref 0.320–4.118)

## 2020-09-15 MED ORDER — HEPARIN SOD (PORK) LOCK FLUSH 100 UNIT/ML IV SOLN
500.0000 [IU] | Freq: Once | INTRAVENOUS | Status: AC | PRN
  Administered 2020-09-15: 500 [IU]
  Filled 2020-09-15: qty 5

## 2020-09-15 MED ORDER — SODIUM CHLORIDE 0.9 % IV SOLN
200.0000 mg | Freq: Once | INTRAVENOUS | Status: AC
  Administered 2020-09-15: 200 mg via INTRAVENOUS
  Filled 2020-09-15: qty 8

## 2020-09-15 MED ORDER — SODIUM CHLORIDE 0.9 % IV SOLN
Freq: Once | INTRAVENOUS | Status: AC
  Filled 2020-09-15: qty 250

## 2020-09-15 MED ORDER — SODIUM CHLORIDE 0.9% FLUSH
10.0000 mL | INTRAVENOUS | Status: DC | PRN
  Administered 2020-09-15: 10 mL
  Filled 2020-09-15: qty 10

## 2020-09-15 MED ORDER — SODIUM CHLORIDE 0.9% FLUSH
10.0000 mL | Freq: Once | INTRAVENOUS | Status: AC
  Administered 2020-09-15: 10 mL
  Filled 2020-09-15: qty 10

## 2020-09-15 NOTE — Progress Notes (Signed)
Deleted Alimta, Compazine and B12 injection per RN request.  Hardie Pulley, PharmD, BCPS, BCOP

## 2020-09-15 NOTE — Patient Instructions (Signed)
[facility] Discharge Instructions for Patients Receiving Chemotherapy  Today you received the following chemotherapy agent: pembrolizumab [name])  To help prevent nausea and vomiting after your treatment, we encourage you to take your nausea medication as directed.  If you develop nausea and vomiting that is not controlled by your nausea medication, call the clinic.   BELOW ARE SYMPTOMS THAT SHOULD BE REPORTED IMMEDIATELY:  *FEVER GREATER THAN 100.5 F  *CHILLS WITH OR WITHOUT FEVER  NAUSEA AND VOMITING THAT IS NOT CONTROLLED WITH YOUR NAUSEA MEDICATION  *UNUSUAL SHORTNESS OF BREATH  *UNUSUAL BRUISING OR BLEEDING  TENDERNESS IN MOUTH AND THROAT WITH OR WITHOUT PRESENCE OF ULCERS  *URINARY PROBLEMS  *BOWEL PROBLEMS  UNUSUAL RASH Items with * indicate a potential emergency and should be followed up as soon as possible.  Feel free to call the clinic should you have any questions or concerns. The clinic phone number is [phone].  Please show the [facility] at check-in to the Emergency Department and triage nurse.

## 2020-09-15 NOTE — Progress Notes (Signed)
Pt not receiving Alimta today d/t kidney function labs, only Keytruda per PA Cassie.

## 2020-09-24 ENCOUNTER — Ambulatory Visit (INDEPENDENT_AMBULATORY_CARE_PROVIDER_SITE_OTHER): Payer: No Typology Code available for payment source | Admitting: Cardiovascular Disease

## 2020-09-24 ENCOUNTER — Encounter: Payer: Self-pay | Admitting: Cardiovascular Disease

## 2020-09-24 ENCOUNTER — Other Ambulatory Visit: Payer: Self-pay

## 2020-09-24 VITALS — BP 106/68 | HR 92 | Ht 68.0 in | Wt 237.2 lb

## 2020-09-24 DIAGNOSIS — R0602 Shortness of breath: Secondary | ICD-10-CM | POA: Diagnosis not present

## 2020-09-24 DIAGNOSIS — R609 Edema, unspecified: Secondary | ICD-10-CM

## 2020-09-24 NOTE — Patient Instructions (Signed)
Medication Instructions:  Your Physician recommend you continue on your current medication as directed.    *If you need a refill on your cardiac medications before your next appointment, please call your pharmacy*   Lab Work: Your physician recommends lab work today (BNP).  If you have labs (blood work) drawn today and your tests are completely normal, you will receive your results only by: Marland Kitchen MyChart Message (if you have MyChart) OR . A paper copy in the mail If you have any lab test that is abnormal or we need to change your treatment, we will call you to review the results.   Testing/Procedures: None   Follow-Up: At [facility], you and your health needs are our priority.  As part of our continuing mission to provide you with exceptional heart care, we have created designated Provider Care Teams.  These Care Teams include your primary Cardiologist (physician) and Advanced Practice Providers (APPs -  Physician Assistants and Nurse Practitioners) who all work together to provide you with the care you need, when you need it.  We recommend signing up for the patient portal called "MyChart".  Sign up information is provided on this After Visit Summary.  MyChart is used to connect with patients for Virtual Visits (Telemedicine).  Patients are able to view lab/test results, encounter notes, upcoming appointments, etc.  Non-urgent messages can be sent to your provider as well.   To learn more about what you can do with MyChart, go to [email or web address].    Your next appointment:   As needed  The format for your next appointment:   In Person  Provider:   Eleonore Chiquito, MD

## 2020-09-24 NOTE — Progress Notes (Signed)
Cardiology Office Note:   Date:  09/24/2020  NAME:  Kevin Lambert.    MRN: [id number] DOB:  08-15-1956   PCP:  Kevin Petrin, MD  Cardiologist:  No primary care provider on file.   Referring MD: Kevin Petrin, MD   Chief Complaint  Patient presents with   Edema   History of Present Illness:   Kevin Mccadden. is a 64 y.o. male with a hx of lung cancer, HTN who is being seen today for the evaluation of edema at the request of Kevin Petrin, MD.  He was diagnosed with lung cancer in December of last year.  He is on palliative chemotherapy.  He reports he has been short of breath since that time.  He reports he gets short of breath with exertion.  Any heavy exertion also gets quite winded.  He is still smoking.  He was evaluated by pulmonary and I have ordered pulmonary function testing but he has not completed them.  His other medical history is notable for edema.  He reports he had generalized edema several months ago.  He was taking Lasix daily but now taking it once per week.  His lab work also shows that he is anemic with a hemoglobin of 10.4.  His creatinine has doubled over the past 6 months as well.  His most recent serum creatinine was 2.24.  He apparently is unaware of these changes.  CVD risk factors include diabetes and hypertension.  Blood pressures well controlled today.  He denies any exertional chest pain or pressure.  I did review his recent CT scans which showed no evidence of pleural effusions.  He did have an echocardiogram done in September which shows normal LV function and a collapsible IVC.  I suspect the swelling is from low albumin, CKD, and malignancy.  He is still smoking half pack per day.  Has done so for 50 years.  No alcohol or drug use reported.  There is no strong family history of heart disease.  He denies any rapid heartbeat sensation.  His EKG today demonstrates normal sinus rhythm with no acute ST-T changes or evidence of prior infarction.  Problem List 1. NSCLC  Stage IV -adenocarcinoma  -on palliative chemotherapy  2. HTN 3.  CKD stage III 4.  Diabetes  Past Medical History: Past Medical History:  Diagnosis Date   Arthritis    Chronic kidney disease    Depression    DM (diabetes mellitus) ([place])    type II   Dyslipidemia    Dyspnea    unable to walk much - he thinks it is from    GERD (gastroesophageal reflux disease)    Hepatitis    Hepatitis C- treated   History of kidney stones    HTN (hypertension)    Neuropathy    nscl ca dx'd 10/2019   OSA on CPAP    PTSD (post-traumatic stress disorder)     Past Surgical History: Past Surgical History:  Procedure Laterality Date   BIOPSY OF MEDIASTINAL MASS  12/10/2019   Procedure: BIOPSY OF MEDIASTINAL MASS;  Surgeon: Garner Nash, DO;  Location: [facility];  Service: Pulmonary;;  right upper lobe   BRONCHIAL NEEDLE ASPIRATION BIOPSY  12/10/2019   Procedure: BRONCHIAL NEEDLE ASPIRATION BIOPSIES;  Surgeon: Garner Nash, DO;  Location: [facility];  Service: Pulmonary;;   COLONOSCOPY     IR IMAGING GUIDED PORT INSERTION  12/31/2019   kidney stone     URETERAL STENT PLACEMENT  Ureteral Stent Removed     VIDEO BRONCHOSCOPY WITH ENDOBRONCHIAL ULTRASOUND N/A 12/10/2019   Procedure: VIDEO BRONCHOSCOPY WITH ENDOBRONCHIAL ULTRASOUND;  Surgeon: Garner Nash, DO;  Location: [facility];  Service: Pulmonary;  Laterality: N/A;    Current Medications: Current Meds  Medication Sig   albuterol (VENTOLIN HFA) 108 (90 Base) MCG/ACT inhaler Inhale 2 puffs into the lungs every 4 (four) hours as needed for wheezing or shortness of breath.   amLODipine (NORVASC) 10 MG tablet Take 10 mg by mouth daily.   Ascorbic Acid (VITAMIN C) 1000 MG tablet Take 1,000 mg by mouth daily.   aspirin EC 81 MG tablet Take 81 mg by mouth daily.   atorvastatin (LIPITOR) 10 MG tablet Take 10 mg by mouth daily.   bisacodyl (DULCOLAX) 5 MG EC tablet Take 5 mg by mouth daily as needed  for moderate constipation.   cholecalciferol (VITAMIN D3) 25 MCG (1000 UNIT) tablet Take 1,000 Units by mouth daily.   Cyanocobalamin (VITAMIN B 12 PO) Take 1 tablet by mouth daily.   folic acid (FOLVITE) 1 MG tablet Take 1 tablet (1 mg total) by mouth daily. Pt needs to pick up today and start ASAP . He will pay out of pocket. He is waiting on the [place] to send his folic acid which will be next week. (Patient taking differently: Take 1 mg by mouth daily. )   furosemide (LASIX) 20 MG tablet Take 1 tablet (20 mg total) by mouth 2 (two) times daily. (Patient taking differently: Take 20 mg by mouth 2 (two) times daily. Pt takes once a week)   glipiZIDE (GLUCOTROL) 5 MG tablet Take 5 mg by mouth daily before breakfast.    magic mouthwash SOLN Take 5 mLs by mouth 4 (four) times daily as needed for mouth pain. Swish and swallow or spit   nicotine (NICODERM CQ) 21 mg/24hr patch Place 1 patch (21 mg total) onto the skin daily.   nicotine polacrilex (COMMIT) 4 MG lozenge Take 1 lozenge (4 mg total) by mouth as needed for smoking cessation.   potassium chloride SA (KLOR-CON) 20 MEQ tablet Take 1 tablet (20 mEq total) by mouth daily.   pregabalin (LYRICA) 25 MG capsule Take 1 capsule (25 mg total) by mouth daily.   Propylene Glycol (SYSTANE BALANCE OP) Place 1 drop into both eyes daily.   zinc gluconate 50 MG tablet Take 50 mg by mouth daily.     Allergies:    Metformin and related and Naproxen   Social History: Social History   Socioeconomic History   Marital status: Single    Spouse name: Not on file   Number of children: Not on file   Years of education: Not on file   Highest education level: Not on file  Occupational History   Not on file  Tobacco Use   Smoking status: Current Every Day Smoker    Packs/day: 0.60    Years: 50.00    Pack years: 30.00    Types: Cigarettes   Smokeless tobacco: Never Used   Tobacco comment: Pt. stated he is still trying to quit.  Vaping Use    Vaping Use: Never used  Substance and Sexual Activity   Alcohol use: Yes    Alcohol/week: 6.0 standard drinks    Types: 6 Cans of beer per week   Drug use: Not Currently   Sexual activity: Not on file  Other Topics Concern   Not on file  Social History Narrative   Not on file  Social Determinants of Health   Financial Resource Strain:    Difficulty of Paying Living Expenses: Not on file  Food Insecurity:    Worried About Charity fundraiser in the Last Year: Not on file   [employer] of Food in the Last Year: Not on file  Transportation Needs:    Lack of Transportation (Medical): Not on file   Lack of Transportation (Non-Medical): Not on file  Physical Activity:    Days of Exercise per Week: Not on file   Minutes of Exercise per Session: Not on file  Stress:    Feeling of Stress : Not on file  Social Connections:    Frequency of Communication with Friends and Family: Not on file   Frequency of Social Gatherings with Friends and Family: Not on file   Attends Religious Services: Not on file   Active Member of Clubs or Organizations: Not on file   Attends Archivist Meetings: Not on file   Marital Status: Not on file     Family History: The patient's family history includes Diabetes in his father; Liver disease in his mother.  ROS:   All other ROS reviewed and negative. Pertinent positives noted in the HPI.     EKGs/Labs/Other Studies Reviewed:   The following studies were personally reviewed by me today:  EKG:  EKG is ordered today.  The ekg ordered today demonstrates normal sinus rhythm, heart rate 92, no acute ischemic changes or evidence of prior infarct, and was personally reviewed by me.   TTE 08/06/2020 1. Left ventricular ejection fraction, by estimation, is 55%. The left  ventricle has normal function. The left ventricle has no regional wall  motion abnormalities. There is moderate left ventricular hypertrophy. Left  ventricular  diastolic parameters are  consistent with Grade I diastolic dysfunction (impaired relaxation).  2. Right ventricular systolic function is normal. The right ventricular  size is normal. Tricuspid regurgitation signal is inadequate for assessing  PA pressure.  3. The mitral valve is normal in structure. No evidence of mitral valve  regurgitation. No evidence of mitral stenosis.  4. The aortic valve is tricuspid. Aortic valve regurgitation is not  visualized. Mild aortic valve sclerosis is present, with no evidence of  aortic valve stenosis.  5. Aortic dilatation noted. There is mild dilatation of the aortic root,  measuring 38 mm.  6. The inferior vena cava is normal in size with greater than 50%  respiratory variability, suggesting right atrial pressure of 3 mmHg.   Recent Labs: 01/28/2020: Magnesium 1.5 09/15/2020: ALT 25; BUN 30; Creatinine 2.24; Hemoglobin 10.4; Platelet Count 239; Potassium 4.2; Sodium 138; TSH 1.279   Recent Lipid Panel No results found for: CHOL, TRIG, HDL, CHOLHDL, VLDL, LDLCALC, LDLDIRECT  Physical Exam:   VS:  BP 106/68 (BP Location: Left Arm, Patient Position: Sitting)    Pulse 92    Ht 5\' 8"  (1.727 m)    Wt 237 lb 3.2 oz (107.6 kg)    SpO2 96%    BMI 36.07 kg/m    Wt Readings from Last 3 Encounters:  09/24/20 237 lb 3.2 oz (107.6 kg)  09/15/20 236 lb 1.6 oz (107.1 kg)  08/24/20 245 lb (111.1 kg)    General: Well nourished, well developed, in no acute distress Heart: Atraumatic, normal size  Eyes: PEERLA, EOMI  Neck: Supple, no JVD Endocrine: No thryomegaly Cardiac: Normal S1, S2; RRR; no murmurs, rubs, or gallops Lungs: Clear to auscultation bilaterally, no wheezing, rhonchi or rales  Abd: Soft, nontender, no hepatomegaly  Ext: Trace edema Musculoskeletal: No deformities, BUE and BLE strength normal and equal Skin: Warm and dry, no rashes   Neuro: Alert and oriented to person, place, time, and situation, CNII-XII grossly intact, no focal  deficits  Psych: Normal mood and affect   ASSESSMENT:   Dilan Novosad. is a 64 y.o. male who presents for the following: 1. Edema, unspecified type   2. SOB (shortness of breath) on exertion     PLAN:   1. Edema, unspecified type 2. SOB (shortness of breath) on exertion -Presents for evaluation of generalized edema that occurred several months ago.  Coincided with diagnosis of metastatic lung cancer.  Symptoms improved with Lasix.  He has no evidence of heart failure on examination.  His EKG is normal.  -He has no evidence of volume overload and I think he can continue his Lasix.  I did review his echocardiogram which shows grade 1 diastolic dysfunction.  He has normal LV function.  He has a collapsible IVC.  It is unlikely that his swelling is coming from his heart.  We will check a BNP to also exclude this. - He has a noticeable 50-pack-year history.  I recommended pulmonary function testing.  Overall, I feel his symptoms are likely more related to COPD and lung cancer.  He also is anemic.  His hemoglobin was 10.4.  This could also make him feel poor.  He also has had a rapid rise in his creatinine and his most recent serum creatinine was 2.24.  Unsure if this is related to his chemotherapy. I have asked him to reach back out to his oncologist.   -His upper extremity swelling is not explained from his heart and likely more explained from his cancer.  He also has low albumin and CKD.  All of these factors can contribute to edema.   -Overall, it appears to be multifactorial.  I recommend he continue his Lasix.  His shortness of breath is not concerning for angina.  Given his advanced malignancy, I would not recommend further evaluation at this time.  Clearly his anemia and kidney disease would certainly complicate things.  He does have coronary calcification seen on a CT scan but no symptoms of angina.   -We will plan to see him as needed.  Disposition: Return if symptoms worsen or fail to  improve.  Medication Adjustments/Labs and Tests Ordered: Current medicines are reviewed at length with the patient today.  Concerns regarding medicines are outlined above.  Orders Placed This Encounter  Procedures   Brain natriuretic peptide   EKG 12-Lead   No orders of the defined types were placed in this encounter.   Patient Instructions  Medication Instructions:  Your Physician recommend you continue on your current medication as directed.    *If you need a refill on your cardiac medications before your next appointment, please call your pharmacy*   Lab Work: Your physician recommends lab work today (BNP).  If you have labs (blood work) drawn today and your tests are completely normal, you will receive your results only by:  [place] (if you have MyChart) OR  A paper copy in the mail If you have any lab test that is abnormal or we need to change your treatment, we will call you to review the results.   Testing/Procedures: None   Follow-Up: At [facility], you and your health needs are our priority.  As part of our continuing mission to provide you with  exceptional heart care, we have created designated Provider Care Teams.  These Care Teams include your primary Cardiologist (physician) and Advanced Practice Providers (APPs -  Physician Assistants and Nurse Practitioners) who all work together to provide you with the care you need, when you need it.  We recommend signing up for the patient portal called "MyChart".  Sign up information is provided on this After Visit Summary.  MyChart is used to connect with patients for Virtual Visits (Telemedicine).  Patients are able to view lab/test results, encounter notes, upcoming appointments, etc.  Non-urgent messages can be sent to your provider as well.   To learn more about what you can do with MyChart, go to [email or web address].    Your next appointment:   As needed  The format for your next appointment:   In  Person  Provider:   Eleonore Chiquito, MD        Signed, Addison Naegeli. Audie Box, [place]  [address] [place], [place] [address] [phone]  09/24/2020 2:54 PM

## 2020-09-25 LAB — BRAIN NATRIURETIC PEPTIDE: BNP: 47.4 pg/mL (ref 0.0–100.0)

## 2020-10-05 ENCOUNTER — Inpatient Hospital Stay: Payer: No Typology Code available for payment source

## 2020-10-05 ENCOUNTER — Inpatient Hospital Stay: Payer: No Typology Code available for payment source | Admitting: Internal Medicine

## 2020-10-06 ENCOUNTER — Other Ambulatory Visit: Payer: Self-pay | Admitting: Physician Assistant

## 2020-10-06 ENCOUNTER — Telehealth: Payer: Self-pay | Admitting: Physician Assistant

## 2020-10-06 ENCOUNTER — Other Ambulatory Visit (HOSPITAL_COMMUNITY)
Admission: RE | Admit: 2020-10-06 | Discharge: 2020-10-06 | Disposition: A | Discharge status: Home / Self Care | Payer: No Typology Code available for payment source | Source: Ambulatory Visit | Attending: Pulmonary Disease | Admitting: Pulmonary Disease

## 2020-10-06 DIAGNOSIS — Z01812 Encounter for preprocedural laboratory examination: Secondary | ICD-10-CM | POA: Insufficient documentation

## 2020-10-06 DIAGNOSIS — Z20822 Contact with and (suspected) exposure to covid-19: Secondary | ICD-10-CM | POA: Insufficient documentation

## 2020-10-06 LAB — SARS CORONAVIRUS 2 (TAT 6-24 HRS): SARS Coronavirus 2: NEGATIVE

## 2020-10-06 NOTE — Telephone Encounter (Signed)
Scheduled appt per 11/29 sch msg - pt is aware of appt date and time

## 2020-10-06 NOTE — Progress Notes (Signed)
[facility] OFFICE PROGRESS NOTE  Kevin Lambert, [address]  [place] [address]  DIAGNOSIS: Stage IV (T2b, N3, M1a) non-small cell lung cancer, adenocarcinoma presented with large right upper lobe lung mass in addition to mediastinal and right supraclavicular lymphadenopathy as well as bilateral pulmonary nodules diagnosed in February 2021.  MOLECULAR STUDY by [employer]:  KRASG12C,4.3%,Binimetinib  [id number],0.9%,Niraparib, Olaparib, Rucaparib,Talazoparib, Tazemetostat  [id number],2.9%,BMWU  PRIOR THERAPY: None  CURRENT THERAPY: Systemic chemotherapy with carboplatin for AUC of 5, Alimta 500 mg/M2 and Keytruda 200 mg IV every 3 weeks. First dose December 31, 2019.Status post13cycles. Starting from cycle #5, he will be on maintenance Alimta and Keytruda.  INTERVAL HISTORY: Kevin Lambert. 64 y.o. male returns to clinic today for a follow-up visit.  The patient is feeling well today without any concerning complaints.  The patient tolerated his last cycle of treatment well without any adverse side effects. Alimta was held with his last cycle of treatment due to elevated creatinine, possibly secondary to his recent lasix use. His swelling has greatly improved and he feels  better. The patient denies any recent fever, chills, or night sweats.  He reports weight loss likely secondary to his Lasix use.  He reports his baseline dyspnea on exertion for which he uses an inhaler. He denies significant cough but gets coughing spells when he gets a tickle in his throat. He did not take his BP medication today since he takes it at night. He is planning on establishing with another PCP in the near future. He denies any coughing, chest pain, or hemoptysis.  He denies any nausea, vomiting, or diarrhea. He occasionally experiences constipation but none lately.  The patient is here today for evaluation before starting cycle #14.   MEDICAL HISTORY: Past Medical  History:  Diagnosis Date  . Arthritis   . Chronic kidney disease   . Depression   . DM (diabetes mellitus) ([place])    type II  . Dyslipidemia   . Dyspnea    unable to walk much - he thinks it is from   . GERD (gastroesophageal reflux disease)   . Hepatitis    Hepatitis C- treated  . History of kidney stones   . HTN (hypertension)   . Neuropathy   . nscl ca dx'd 10/2019  . OSA on CPAP   . PTSD (post-traumatic stress disorder)     ALLERGIES:  is allergic to metformin and related and naproxen.  MEDICATIONS:  Current Outpatient Medications  Medication Sig Dispense Refill  . albuterol (VENTOLIN HFA) 108 (90 Base) MCG/ACT inhaler Inhale 2 puffs into the lungs every 4 (four) hours as needed for wheezing or shortness of breath. 18 g 0  . amLODipine (NORVASC) 10 MG tablet Take 10 mg by mouth daily.    . Ascorbic Acid (VITAMIN C) 1000 MG tablet Take 1,000 mg by mouth daily.    Marland Kitchen aspirin EC 81 MG tablet Take 81 mg by mouth daily.    Marland Kitchen atorvastatin (LIPITOR) 10 MG tablet Take 10 mg by mouth daily.    . bisacodyl (DULCOLAX) 5 MG EC tablet Take 5 mg by mouth daily as needed for moderate constipation.    . cholecalciferol (VITAMIN D3) 25 MCG (1000 UNIT) tablet Take 1,000 Units by mouth daily.    . Cyanocobalamin (VITAMIN B 12 PO) Take 1 tablet by mouth daily.    . folic acid (FOLVITE) 1 MG tablet Take 1 tablet (1 mg total) by mouth daily. Pt needs to pick  up today and start ASAP . He will pay out of pocket. He is waiting on the [place] to send his folic acid which will be next week. (Patient taking differently: Take 1 mg by mouth daily. ) 7 tablet 0  . furosemide (LASIX) 20 MG tablet Take 1 tablet (20 mg total) by mouth 2 (two) times daily. (Patient taking differently: Take 20 mg by mouth 2 (two) times daily. Pt takes once a week) 20 tablet 0  . glipiZIDE (GLUCOTROL) 5 MG tablet Take 5 mg by mouth daily before breakfast.     . lidocaine-prilocaine (EMLA) cream Apply to Port-A-Cath site 30-60-minute  before treatment. 30 g 0  . magic mouthwash SOLN Take 5 mLs by mouth 4 (four) times daily as needed for mouth pain. Swish and swallow or spit 240 mL 2  . nicotine (NICODERM CQ) 21 mg/24hr patch Place 1 patch (21 mg total) onto the skin daily. 28 patch 3  . nicotine polacrilex (COMMIT) 4 MG lozenge Take 1 lozenge (4 mg total) by mouth as needed for smoking cessation. 108 tablet 3  . potassium chloride SA (KLOR-CON) 20 MEQ tablet Take 1 tablet (20 mEq total) by mouth daily. 10 tablet 0  . pregabalin (LYRICA) 25 MG capsule Take 1 capsule (25 mg total) by mouth daily. 30 capsule 1  . Propylene Glycol (SYSTANE BALANCE OP) Place 1 drop into both eyes daily.    Marland Kitchen triamterene-hydrochlorothiazide (MAXZIDE-25) 37.5-25 MG tablet Take 1 tablet by mouth daily.     Marland Kitchen umeclidinium-vilanterol (ANORO ELLIPTA) 62.5-25 MCG/INH AEPB Inhale 1 puff into the lungs daily. 60 each 11  . umeclidinium-vilanterol (ANORO ELLIPTA) 62.5-25 MCG/INH AEPB Inhale 1 puff into the lungs daily. 14 each 0  . zinc gluconate 50 MG tablet Take 50 mg by mouth daily.     No current facility-administered medications for this visit.    SURGICAL HISTORY:  Past Surgical History:  Procedure Laterality Date  . BIOPSY OF MEDIASTINAL MASS  12/10/2019   Procedure: BIOPSY OF MEDIASTINAL MASS;  Surgeon: Garner Nash, DO;  Location: [facility];  Service: Pulmonary;;  right upper lobe  . BRONCHIAL NEEDLE ASPIRATION BIOPSY  12/10/2019   Procedure: BRONCHIAL NEEDLE ASPIRATION BIOPSIES;  Surgeon: Garner Nash, DO;  Location: [facility];  Service: Pulmonary;;  . COLONOSCOPY    . IR IMAGING GUIDED PORT INSERTION  12/31/2019  . kidney stone    . URETERAL STENT PLACEMENT    . Ureteral Stent Removed    . VIDEO BRONCHOSCOPY WITH ENDOBRONCHIAL ULTRASOUND N/A 12/10/2019   Procedure: VIDEO BRONCHOSCOPY WITH ENDOBRONCHIAL ULTRASOUND;  Surgeon: Garner Nash, DO;  Location: [facility];  Service: Pulmonary;  Laterality: N/A;    REVIEW OF SYSTEMS:    Constitutional:Positive for fatigue.Negative for appetite change, chills, fever and unexpected weight change.  HENT:Negative for mouth sores, sore throat and trouble swallowing.  Eyes: Positive for teary eyes (likely secondary to alimta). Negative for eye problems and icterus.  Respiratory:Positive for baseline shortness of breath with exertion.Negative for cough, hemoptysis, and wheezing.  Cardiovascular:Improvement in upper extremity swelling. Negative for chest pain. Gastrointestinal:Negative for diarrhea, constipation, vomiting, ornausea. Genitourinary: Negative for bladder incontinence, difficulty urinating, dysuria, frequency and hematuria.  Musculoskeletal: Negative for back pain, gait problem, neck pain and neck stiffness.  Skin:Positive for darkening of the skin in the left lower extremity with associated dry skin. Neurological:Positive for one episode of dizziness this AM.Negative for extremity weakness, gait problem, headaches, light-headedness and seizures.  Hematological: Negative for adenopathy. Does not bruise/bleed  easily.  Psychiatric/Behavioral: Negative for confusion, depression and sleep disturbance. The patient is not nervous/anxious.   PHYSICAL EXAMINATION:  Blood pressure (!) 165/72, pulse 92, temperature 98.5 F (36.9 C), temperature source Tympanic, resp. rate 20, height 5\' 8"  (1.727 m), weight 229 lb 12.8 oz (104.2 kg), SpO2 100 %.  ECOG PERFORMANCE STATUS: 1 - Symptomatic but completely ambulatory  Physical Exam  Head: Normocephalic and atraumatic.  Mouth/Throat: Oropharynx is clear and moist. No oropharyngeal exudate.  Eyes:Positive for tearing eyes.Conjunctivae are normal. Right eye exhibits no discharge. Left eye exhibits no discharge. No scleral icterus.  Neck: Normal range of motion. Neck supple.  Cardiovascular: Normal rate, regular rhythm, normal heart sounds and intact distal pulses.  Pulmonary/Chest: Effort normal and breath sounds  normal. No respiratory distress. No wheezes. No rales.  Abdominal: Soft. Bowel sounds are normal. Exhibits no distension and no mass. There is no tenderness.  Musculoskeletal: normal range of motion.  Lymphadenopathy:  No cervical adenopathy.  Neurological: Alert and oriented to person, place, and time. Exhibits normal muscle tone. Gait normal. Coordination normal.  Skin:Positive for darkening of the skin in the left lower extremity with associated dry skin.Not diaphoretic. No erythema. No pallor.  Psychiatric: Mood, memory and judgment normal.  Vitals reviewed.  LABORATORY DATA: Lab Results  Component Value Date   WBC 5.7 10/08/2020   HGB 11.4 (L) 10/08/2020   HCT 33.6 (L) 10/08/2020   MCV 100.9 (H) 10/08/2020   PLT 211 10/08/2020      Chemistry      Component Value Date/Time   NA 138 10/08/2020 1322   K 4.1 10/08/2020 1322   CL 105 10/08/2020 1322   CO2 21 (L) 10/08/2020 1322   BUN 15 10/08/2020 1322   CREATININE 2.36 (H) 10/08/2020 1322      Component Value Date/Time   CALCIUM 10.0 10/08/2020 1322   ALKPHOS 87 10/08/2020 1322   AST 26 10/08/2020 1322   ALT 28 10/08/2020 1322   BILITOT 0.2 (L) 10/08/2020 1322       RADIOGRAPHIC STUDIES:  CT Abdomen Pelvis Wo Contrast  Result Date: 09/11/2020 CLINICAL DATA:  Non-small cell lung cancer restaging. Chemotherapy and immunotherapy ongoing. EXAM: CT CHEST, ABDOMEN AND PELVIS WITHOUT CONTRAST TECHNIQUE: Multidetector CT imaging of the chest, abdomen and pelvis was performed following the standard protocol without IV contrast. COMPARISON:  07/08/2020 FINDINGS: CT CHEST FINDINGS Cardiovascular: Right Port-A-Cath tip: Cavoatrial junction. Coronary, aortic arch, and branch vessel atherosclerotic vascular disease. Mediastinum/Nodes: Indistinctly marginated right paratracheal lymph node 1.0 cm in short axis on image 19 of series 2, previously 1.2 cm by my measurements. Lungs/Pleura: New moderate left and trace right pleural  effusions with passive atelectasis. The right upper lobe mass abutting the major fissure measures 3.7 by 2.9 cm on image 54 series 6 when measured in the same fashion as on the prior exam, previously measuring 3.7 by 3.1 cm. Subjectively the lesion is unchanged in size and contour, and continues to obstruct in adjacent subsegmental bronchus posteriorly in the right upper lobe. There is new left lower lobe atelectasis along the major fissure. Stable small foci of pleural based thickening of the major fissure along the right middle lobe posteriorly for example on images 64-77 of series 6. Musculoskeletal: Thoracic spondylosis and degenerative disc disease. CT ABDOMEN PELVIS FINDINGS Hepatobiliary: Unremarkable noncontrast CT appearance. Pancreas: Unremarkable Spleen: Unremarkable Adrenals/Urinary Tract: 1.6 by 2.6 cm right adrenal nodule, precontrast density of 4 Hounsfield units, compatible with adenoma. 1.6 by 1.2 cm left adrenal nodule, internal  density 17 Hounsfield units, nonspecific based on density. Neither of these nodules was hypermetabolic on prior PET-CT, and accordingly the appearance is likely benign. Vascular calcifications in both renal hila. Potential nonobstructive nephrolithiasis including a suspected 0.7 cm left kidney lower pole calculus on image 69 of series 2 which appears to have migrated from the upper pole. No hydronephrosis or hydroureter. Urinary bladder unremarkable. Increased bilateral perirenal stranding extending adjacent to the ureters as well. Stomach/Bowel: mild circumferential rectal wall thickening may be from nondistention. Otherwise unremarkable. Vascular/Lymphatic: Aortoiliac atherosclerotic vascular disease. No pathologic adenopathy is identified. Reproductive: The calcified vas deferens. Other: Mild nonspecific presacral edema. Musculoskeletal: Small direct right inguinal hernia containing adipose tissue. Small lipoma along the left external oblique muscle posterolaterally on  image 67 of series 2, unchanged. Lumbar spondylosis and degenerative disc disease causing impingement at L3-4 and L4-5. IMPRESSION: 1. Stable size and contour of the right upper lobe mass abutting the major fissure, with continued obstruction of adjacent subsegmental bronchus posteriorly in the right upper lobe. 2. New moderate left and trace right pleural effusions with passive atelectasis. 3. Increased bilateral perirenal stranding extending adjacent to the ureters as well as mild nonspecific presacral edema. 4. Other imaging findings of potential clinical significance: Coronary, aortic arch, and branch vessel atherosclerotic vascular disease. Right adrenal adenoma. Potential nonobstructive nephrolithiasis including a 0.7 cm left kidney lower pole calculus which appears to have migrated from the upper pole. Small direct right inguinal hernia containing adipose tissue. Lumbar spondylosis and degenerative disc disease causing impingement at L3-4 and L4-5. Mild nonspecific presacral edema. 5. Aortic atherosclerosis. Aortic Atherosclerosis (ICD10-I70.0). Electronically Signed   By: Van Clines M.D.   On: 09/11/2020 16:22   CT Chest Wo Contrast  Result Date: 09/11/2020 CLINICAL DATA:  Non-small cell lung cancer restaging. Chemotherapy and immunotherapy ongoing. EXAM: CT CHEST, ABDOMEN AND PELVIS WITHOUT CONTRAST TECHNIQUE: Multidetector CT imaging of the chest, abdomen and pelvis was performed following the standard protocol without IV contrast. COMPARISON:  07/08/2020 FINDINGS: CT CHEST FINDINGS Cardiovascular: Right Port-A-Cath tip: Cavoatrial junction. Coronary, aortic arch, and branch vessel atherosclerotic vascular disease. Mediastinum/Nodes: Indistinctly marginated right paratracheal lymph node 1.0 cm in short axis on image 19 of series 2, previously 1.2 cm by my measurements. Lungs/Pleura: New moderate left and trace right pleural effusions with passive atelectasis. The right upper lobe mass abutting  the major fissure measures 3.7 by 2.9 cm on image 54 series 6 when measured in the same fashion as on the prior exam, previously measuring 3.7 by 3.1 cm. Subjectively the lesion is unchanged in size and contour, and continues to obstruct in adjacent subsegmental bronchus posteriorly in the right upper lobe. There is new left lower lobe atelectasis along the major fissure. Stable small foci of pleural based thickening of the major fissure along the right middle lobe posteriorly for example on images 64-77 of series 6. Musculoskeletal: Thoracic spondylosis and degenerative disc disease. CT ABDOMEN PELVIS FINDINGS Hepatobiliary: Unremarkable noncontrast CT appearance. Pancreas: Unremarkable Spleen: Unremarkable Adrenals/Urinary Tract: 1.6 by 2.6 cm right adrenal nodule, precontrast density of 4 Hounsfield units, compatible with adenoma. 1.6 by 1.2 cm left adrenal nodule, internal density 17 Hounsfield units, nonspecific based on density. Neither of these nodules was hypermetabolic on prior PET-CT, and accordingly the appearance is likely benign. Vascular calcifications in both renal hila. Potential nonobstructive nephrolithiasis including a suspected 0.7 cm left kidney lower pole calculus on image 69 of series 2 which appears to have migrated from the upper pole. No hydronephrosis or hydroureter.  Urinary bladder unremarkable. Increased bilateral perirenal stranding extending adjacent to the ureters as well. Stomach/Bowel: mild circumferential rectal wall thickening may be from nondistention. Otherwise unremarkable. Vascular/Lymphatic: Aortoiliac atherosclerotic vascular disease. No pathologic adenopathy is identified. Reproductive: The calcified vas deferens. Other: Mild nonspecific presacral edema. Musculoskeletal: Small direct right inguinal hernia containing adipose tissue. Small lipoma along the left external oblique muscle posterolaterally on image 67 of series 2, unchanged. Lumbar spondylosis and degenerative  disc disease causing impingement at L3-4 and L4-5. IMPRESSION: 1. Stable size and contour of the right upper lobe mass abutting the major fissure, with continued obstruction of adjacent subsegmental bronchus posteriorly in the right upper lobe. 2. New moderate left and trace right pleural effusions with passive atelectasis. 3. Increased bilateral perirenal stranding extending adjacent to the ureters as well as mild nonspecific presacral edema. 4. Other imaging findings of potential clinical significance: Coronary, aortic arch, and branch vessel atherosclerotic vascular disease. Right adrenal adenoma. Potential nonobstructive nephrolithiasis including a 0.7 cm left kidney lower pole calculus which appears to have migrated from the upper pole. Small direct right inguinal hernia containing adipose tissue. Lumbar spondylosis and degenerative disc disease causing impingement at L3-4 and L4-5. Mild nonspecific presacral edema. 5. Aortic atherosclerosis. Aortic Atherosclerosis (ICD10-I70.0). Electronically Signed   By: Van Clines M.D.   On: 09/11/2020 16:22     ASSESSMENT/PLAN:  This is a very pleasant 64 year old African-American male diagnosed with stage IV non-small cell lung cancer, adenocarcinoma. He presented with a large right upper lobe lung mass with right hilar, subcarinal, paratracheal lymphadenopathy,and right supraclavicular lymphadenopathy. He also presented with bilateral pulmonary nodules. He was diagnosed in February 2021. He does not have any actionable mutations.  The patient is currently undergoing palliative systemic chemotherapy with carboplatin for an AUC of 5, Alimta 500 mg/m, Keytruda 200 mg IV every 3 weeks. He is status post13cyclesand he tolerated it well without any adverse side effects. Starting from cycle #5, the patient has been on maintenance alimta and [place].  Labs were reviewed with Dr. Julien Nordmann. His creatinine continues to be elevated. The patient was  advised to hold the lasix for now. Dr. Julien Nordmann recommends that he proceed with Keytruda today only. He will not receive Alimta today.   We will see him back for a follow up visit in 3 weeks for evaluation before starting cycle #15.   The patient's BP is elevated today. He did not take his anti-hypertensive yet today. Advised him to take it upon returning home. I also encouraged him to get a BP cuff at home and keep a recording of his readings to share with his PCP. Discussed that BP management is important to protect his kidneys as well.   The patient was advised to call immediately if he has any concerning symptoms in the interval. The patient voices understanding of current disease status and treatment options and is in agreement with the current care plan. All questions were answered. The patient knows to call the clinic with any problems, questions or concerns. We can certainly see the patient much sooner if necessary    No orders of the defined types were placed in this encounter.    Kevin Scrima L Celia Friedland, PA-C 10/08/20

## 2020-10-08 ENCOUNTER — Other Ambulatory Visit: Payer: Self-pay

## 2020-10-08 ENCOUNTER — Inpatient Hospital Stay: Payer: No Typology Code available for payment source

## 2020-10-08 ENCOUNTER — Inpatient Hospital Stay: Payer: No Typology Code available for payment source | Attending: Internal Medicine

## 2020-10-08 ENCOUNTER — Inpatient Hospital Stay (HOSPITAL_BASED_OUTPATIENT_CLINIC_OR_DEPARTMENT_OTHER): Payer: No Typology Code available for payment source | Admitting: Physician Assistant

## 2020-10-08 VITALS — BP 165/72 | HR 92 | Temp 98.5°F | Resp 20 | Ht 68.0 in | Wt 229.8 lb

## 2020-10-08 DIAGNOSIS — C3491 Malignant neoplasm of unspecified part of right bronchus or lung: Secondary | ICD-10-CM

## 2020-10-08 DIAGNOSIS — Z79899 Other long term (current) drug therapy: Secondary | ICD-10-CM | POA: Insufficient documentation

## 2020-10-08 DIAGNOSIS — I129 Hypertensive chronic kidney disease with stage 1 through stage 4 chronic kidney disease, or unspecified chronic kidney disease: Secondary | ICD-10-CM | POA: Diagnosis not present

## 2020-10-08 DIAGNOSIS — Z5112 Encounter for antineoplastic immunotherapy: Secondary | ICD-10-CM | POA: Diagnosis not present

## 2020-10-08 DIAGNOSIS — C3411 Malignant neoplasm of upper lobe, right bronchus or lung: Secondary | ICD-10-CM | POA: Insufficient documentation

## 2020-10-08 DIAGNOSIS — N189 Chronic kidney disease, unspecified: Secondary | ICD-10-CM | POA: Diagnosis not present

## 2020-10-08 DIAGNOSIS — Z95828 Presence of other vascular implants and grafts: Secondary | ICD-10-CM

## 2020-10-08 DIAGNOSIS — R7989 Other specified abnormal findings of blood chemistry: Secondary | ICD-10-CM | POA: Diagnosis not present

## 2020-10-08 LAB — CMP (CANCER CENTER ONLY)
ALT: 28 U/L (ref 0–44)
AST: 26 U/L (ref 15–41)
Albumin: 3.2 g/dL — ABNORMAL LOW (ref 3.5–5.0)
Alkaline Phosphatase: 87 U/L (ref 38–126)
Anion gap: 12 (ref 5–15)
BUN: 15 mg/dL (ref 8–23)
CO2: 21 mmol/L — ABNORMAL LOW (ref 22–32)
Calcium: 10 mg/dL (ref 8.9–10.3)
Chloride: 105 mmol/L (ref 98–111)
Creatinine: 2.36 mg/dL — ABNORMAL HIGH (ref 0.61–1.24)
GFR, Estimated: 30 mL/min — ABNORMAL LOW (ref >60)
Glucose, Bld: 135 mg/dL — ABNORMAL HIGH (ref 70–99)
Potassium: 4.1 mmol/L (ref 3.5–5.1)
Sodium: 138 mmol/L (ref 135–145)
Total Bilirubin: 0.2 mg/dL — ABNORMAL LOW (ref 0.3–1.2)
Total Protein: 7.2 g/dL (ref 6.5–8.1)

## 2020-10-08 LAB — CBC WITH DIFFERENTIAL (CANCER CENTER ONLY)
Abs Immature Granulocytes: 0.01 10*3/uL (ref 0.00–0.07)
Basophils Absolute: 0 10*3/uL (ref 0.0–0.1)
Basophils Relative: 1 %
Eosinophils Absolute: 0.2 10*3/uL (ref 0.0–0.5)
Eosinophils Relative: 3 %
HCT: 33.6 % — ABNORMAL LOW (ref 39.0–52.0)
Hemoglobin: 11.4 g/dL — ABNORMAL LOW (ref 13.0–17.0)
Immature Granulocytes: 0 %
Lymphocytes Relative: 38 %
Lymphs Abs: 2.2 10*3/uL (ref 0.7–4.0)
MCH: 34.2 pg — ABNORMAL HIGH (ref 26.0–34.0)
MCHC: 33.9 g/dL (ref 30.0–36.0)
MCV: 100.9 fL — ABNORMAL HIGH (ref 80.0–100.0)
Monocytes Absolute: 0.6 10*3/uL (ref 0.1–1.0)
Monocytes Relative: 11 %
Neutro Abs: 2.7 10*3/uL (ref 1.7–7.7)
Neutrophils Relative %: 47 %
Platelet Count: 211 10*3/uL (ref 150–400)
RBC: 3.33 MIL/uL — ABNORMAL LOW (ref 4.22–5.81)
RDW: 13.8 % (ref 11.5–15.5)
WBC Count: 5.7 10*3/uL (ref 4.0–10.5)
nRBC: 0 % (ref 0.0–0.2)

## 2020-10-08 LAB — TSH: TSH: 0.709 u[IU]/mL (ref 0.320–4.118)

## 2020-10-08 MED ORDER — HEPARIN SOD (PORK) LOCK FLUSH 100 UNIT/ML IV SOLN
500.0000 [IU] | Freq: Once | INTRAVENOUS | Status: AC | PRN
  Administered 2020-10-08: 500 [IU]
  Filled 2020-10-08: qty 5

## 2020-10-08 MED ORDER — SODIUM CHLORIDE 0.9 % IV SOLN
200.0000 mg | Freq: Once | INTRAVENOUS | Status: AC
  Administered 2020-10-08: 200 mg via INTRAVENOUS
  Filled 2020-10-08: qty 8

## 2020-10-08 MED ORDER — SODIUM CHLORIDE 0.9% FLUSH
10.0000 mL | INTRAVENOUS | Status: DC | PRN
  Administered 2020-10-08: 10 mL
  Filled 2020-10-08: qty 10

## 2020-10-08 MED ORDER — SODIUM CHLORIDE 0.9% FLUSH
10.0000 mL | Freq: Once | INTRAVENOUS | Status: AC
  Administered 2020-10-08: 10 mL
  Filled 2020-10-08: qty 10

## 2020-10-08 MED ORDER — SODIUM CHLORIDE 0.9 % IV SOLN
Freq: Once | INTRAVENOUS | Status: AC
  Filled 2020-10-08: qty 250

## 2020-10-08 NOTE — Progress Notes (Addendum)
Ok to treat with Keytruda only today. Alimta discontinued for today. Ok to treat with Scr 2.36. MD feels SCr increase is not related to the [facility].  Hardie Pulley, PharmD, BCPS, BCOP

## 2020-10-08 NOTE — Patient Instructions (Signed)
[facility] Discharge Instructions for Patients Receiving Chemotherapy  Today you received the following chemotherapy agents :  Keytruda.  To help prevent nausea and vomiting after your treatment, we encourage you to take your nausea medication as prescribed.   If you develop nausea and vomiting that is not controlled by your nausea medication, call the clinic.   BELOW ARE SYMPTOMS THAT SHOULD BE REPORTED IMMEDIATELY:  *FEVER GREATER THAN 100.5 F  *CHILLS WITH OR WITHOUT FEVER  NAUSEA AND VOMITING THAT IS NOT CONTROLLED WITH YOUR NAUSEA MEDICATION  *UNUSUAL SHORTNESS OF BREATH  *UNUSUAL BRUISING OR BLEEDING  TENDERNESS IN MOUTH AND THROAT WITH OR WITHOUT PRESENCE OF ULCERS  *URINARY PROBLEMS  *BOWEL PROBLEMS  UNUSUAL RASH Items with * indicate a potential emergency and should be followed up as soon as possible.  Feel free to call the clinic should you have any questions or concerns. The clinic phone number is [phone].  Please show the CHEMO ALERT CARD at check-in to the Emergency Department and triage nurse.  

## 2020-10-08 NOTE — Progress Notes (Signed)
Patient discharged from the Cancer Center in stable condition and with no signs of acute distress.    

## 2020-10-09 ENCOUNTER — Ambulatory Visit: Payer: No Typology Code available for payment source | Admitting: Pulmonary Disease

## 2020-10-13 ENCOUNTER — Telehealth: Payer: Self-pay | Admitting: Physician Assistant

## 2020-10-13 NOTE — Telephone Encounter (Signed)
Moved 12/20 and 1/10 appts to a couple days later per provider request. Called and left msg. Mailed printout

## 2020-10-22 NOTE — Progress Notes (Signed)
[facility] OFFICE PROGRESS NOTE  [name], [place]  [place] [address]  DIAGNOSIS: Stage IV (T2b, N3, M1a) non-small cell lung cancer, adenocarcinoma presented with large right upper lobe lung mass in addition to mediastinal and right supraclavicular lymphadenopathy as well as bilateral pulmonary nodules diagnosed in February 2021.  MOLECULAR STUDY by [employer]:  KRASG12C,4.3%,Binimetinib  [id number],0.9%,Niraparib, Olaparib, Rucaparib,Talazoparib, Tazemetostat  [id number],2.5%,DGUY  PRIOR THERAPY: None  CURRENT THERAPY: Systemic chemotherapy with carboplatin for AUC of 5, Alimta 500 mg/M2 and Keytruda 200 mg IV every 3 weeks. First dose December 31, 2019.Status post14cycles. Starting from cycle #5, he will be on maintenance Alimta and Keytruda.Alimta was held during cycle #13 and 14 due to renal insufficiency.    INTERVAL HISTORY: Kevin Lambert. 64 y.o. male returns to the clinic today for a follow up visit. The patient is feeling well today without any concerning complaints. The patient tolerated his last cycle of treatment well without any adverse side effects. Alimta was held with his last cycle of treatment due to elevated creatinine. The patient denies any recent fever, chills, or night sweats. He reports weight loss. He states he has an appetite but he just does not want to cook or do dishes so he has been eating less. He states he has access to food as well and that is not a factor.He reports his baseline dyspnea on exertion for which he uses an inhaler. He notes his shortness of breath has actually improved since her has lost weight. He denies significant cough. He did not take his BP medication today or yesterday. He admits he is not very complaint with taking this as prescribed. He established care with a new PCP at the [facility].  He is planning on establishing with another PCP in the near future. He denies any coughing, chest pain,  or hemoptysis. He denies any nausea, vomiting, ordiarrhea. He occasionally experiences mild constipation.He denies any rashes or skin changes. He denies headaches or visual changes. He reports intermittent dizziness, denies any today. The patient is here today for evaluation before starting cycle #15.  MEDICAL HISTORY: Past Medical History:  Diagnosis Date  . Arthritis   . Chronic kidney disease   . Depression   . DM (diabetes mellitus) ([place])    type II  . Dyslipidemia   . Dyspnea    unable to walk much - he thinks it is from   . GERD (gastroesophageal reflux disease)   . Hepatitis    Hepatitis C- treated  . History of kidney stones   . HTN (hypertension)   . Neuropathy   . nscl ca dx'd 10/2019  . OSA on CPAP   . PTSD (post-traumatic stress disorder)     ALLERGIES:  is allergic to metformin and related and naproxen.  MEDICATIONS:  Current Outpatient Medications  Medication Sig Dispense Refill  . albuterol (VENTOLIN HFA) 108 (90 Base) MCG/ACT inhaler Inhale 2 puffs into the lungs every 4 (four) hours as needed for wheezing or shortness of breath. 18 g 0  . amLODipine (NORVASC) 10 MG tablet Take 10 mg by mouth daily.    . Ascorbic Acid (VITAMIN C) 1000 MG tablet Take 1,000 mg by mouth daily.    Marland Kitchen aspirin EC 81 MG tablet Take 81 mg by mouth daily.    Marland Kitchen atorvastatin (LIPITOR) 10 MG tablet Take 10 mg by mouth daily.    . bisacodyl (DULCOLAX) 5 MG EC tablet Take 5 mg by mouth daily as needed  for moderate constipation.    . cholecalciferol (VITAMIN D3) 25 MCG (1000 UNIT) tablet Take 1,000 Units by mouth daily.    . Cyanocobalamin (VITAMIN B 12 PO) Take 1 tablet by mouth daily.    . folic acid (FOLVITE) 1 MG tablet Take 1 tablet (1 mg total) by mouth daily. Pt needs to pick up today and start ASAP . He will pay out of pocket. He is waiting on the [facility] to send his folic acid which will be next week. (Patient taking differently: Take 1 mg by mouth daily.) 7 tablet 0  . furosemide  (LASIX) 20 MG tablet Take 1 tablet (20 mg total) by mouth 2 (two) times daily. (Patient taking differently: Take 20 mg by mouth 2 (two) times daily. Pt takes once a week) 20 tablet 0  . glipiZIDE (GLUCOTROL) 5 MG tablet Take 5 mg by mouth daily before breakfast.     . lidocaine-prilocaine (EMLA) cream Apply to Port-A-Cath site 30-60-minute before treatment. 30 g 0  . magic mouthwash SOLN Take 5 mLs by mouth 4 (four) times daily as needed for mouth pain. Swish and swallow or spit 240 mL 2  . nicotine (NICODERM CQ) 21 mg/24hr patch Place 1 patch (21 mg total) onto the skin daily. 28 patch 3  . nicotine polacrilex (COMMIT) 4 MG lozenge Take 1 lozenge (4 mg total) by mouth as needed for smoking cessation. 108 tablet 3  . potassium chloride SA (KLOR-CON) 20 MEQ tablet Take 1 tablet (20 mEq total) by mouth daily. 10 tablet 0  . pregabalin (LYRICA) 25 MG capsule Take 1 capsule (25 mg total) by mouth daily. 30 capsule 1  . Propylene Glycol (SYSTANE BALANCE OP) Place 1 drop into both eyes daily.    Marland Kitchen triamterene-hydrochlorothiazide (MAXZIDE-25) 37.5-25 MG tablet Take 1 tablet by mouth daily.    Marland Kitchen umeclidinium-vilanterol (ANORO ELLIPTA) 62.5-25 MCG/INH AEPB Inhale 1 puff into the lungs daily. 60 each 11  . umeclidinium-vilanterol (ANORO ELLIPTA) 62.5-25 MCG/INH AEPB Inhale 1 puff into the lungs daily. 14 each 0  . zinc gluconate 50 MG tablet Take 50 mg by mouth daily.     No current facility-administered medications for this visit.    SURGICAL HISTORY:  Past Surgical History:  Procedure Laterality Date  . BIOPSY OF MEDIASTINAL MASS  12/10/2019   Procedure: BIOPSY OF MEDIASTINAL MASS;  Surgeon: Garner Nash, DO;  Location: [facility];  Service: Pulmonary;;  right upper lobe  . BRONCHIAL NEEDLE ASPIRATION BIOPSY  12/10/2019   Procedure: BRONCHIAL NEEDLE ASPIRATION BIOPSIES;  Surgeon: Garner Nash, DO;  Location: [facility];  Service: Pulmonary;;  . COLONOSCOPY    . IR IMAGING GUIDED PORT  INSERTION  12/31/2019  . kidney stone    . URETERAL STENT PLACEMENT    . Ureteral Stent Removed    . VIDEO BRONCHOSCOPY WITH ENDOBRONCHIAL ULTRASOUND N/A 12/10/2019   Procedure: VIDEO BRONCHOSCOPY WITH ENDOBRONCHIAL ULTRASOUND;  Surgeon: Garner Nash, DO;  Location: [facility];  Service: Pulmonary;  Laterality: N/A;    REVIEW OF SYSTEMS:   Constitutional:Positive for fatigue.Negative for appetite change, chills, fever and unexpected weight change.  HENT:Negative for mouth sores, sore throat and trouble swallowing.  Eyes: Negative for eye problems and icterus.  Respiratory:Positive for baseline shortness of breath with exertion (stable/improved).Negative for cough, hemoptysis, and wheezing.  Cardiovascular:Negative for chest pain no lower extremity swelling. Gastrointestinal:Negative fordiarrhea, constipation, vomiting, ornausea. Genitourinary: Negative for bladder incontinence, difficulty urinating, dysuria, frequency and hematuria.  Musculoskeletal: Negative for back pain,  gait problem, neck pain and neck stiffness.  Skin:Positive for darkening of the skin in the left lower extremity with associated dry skin. Neurological:Positive for intermittent dizzy spells. Negative for extremity weakness, gait problem, headaches, light-headedness and seizures.  Hematological: Negative for adenopathy. Does not bruise/bleed easily.  Psychiatric/Behavioral: Negative for confusion, depression and sleep disturbance. The patient is not nervous/anxious.   PHYSICAL EXAMINATION:  Blood pressure (!) 164/106, pulse 92, temperature 97.7 F (36.5 C), temperature source Tympanic, resp. rate 20, height 5\' 8"  (1.727 m), weight 220 lb 9.6 oz (100.1 kg), SpO2 100 %.  ECOG PERFORMANCE STATUS: 1 - Symptomatic but completely ambulatory  Physical Exam  Constitutional: Oriented to person, place, and time and well-developed, well-nourished, and in no distress. No distress.  HENT:  Head:  Normocephalic and atraumatic.  Mouth/Throat: Oropharynx is clear and moist. No oropharyngeal exudate.  Eyes: Conjunctivae are normal. Right eye exhibits no discharge. Left eye exhibits no discharge. No scleral icterus.  Neck: Normal range of motion. Neck supple.  Cardiovascular: Normal rate, regular rhythm, normal heart sounds and intact distal pulses.   Pulmonary/Chest: Effort normal and breath sounds normal. No respiratory distress. No wheezes. No rales.  Abdominal: Soft. Bowel sounds are normal. Exhibits no distension and no mass. There is no tenderness.  Musculoskeletal: Normal range of motion. Exhibits no edema.  Lymphadenopathy:    No cervical adenopathy.  Neurological: Alert and oriented to person, place, and time. Exhibits normal muscle tone. Gait normal. Coordination normal.  Skin: Skin is warm and dry. No rash noted. Not diaphoretic. No erythema. No pallor.  Psychiatric: Mood, memory and judgment normal.  Vitals reviewed.  LABORATORY DATA: Lab Results  Component Value Date   WBC 5.4 10/28/2020   HGB 12.2 (L) 10/28/2020   HCT 34.9 (L) 10/28/2020   MCV 96.1 10/28/2020   PLT 256 10/28/2020      Chemistry      Component Value Date/Time   NA 138 10/28/2020 0955   K 4.0 10/28/2020 0955   CL 106 10/28/2020 0955   CO2 23 10/28/2020 0955   BUN 13 10/28/2020 0955   CREATININE 2.18 (H) 10/28/2020 0955      Component Value Date/Time   CALCIUM 9.9 10/28/2020 0955   ALKPHOS 86 10/28/2020 0955   AST 26 10/28/2020 0955   ALT 40 10/28/2020 0955   BILITOT 0.4 10/28/2020 0955       RADIOGRAPHIC STUDIES:  No results found.   ASSESSMENT/PLAN:  This is a very pleasant 64 year old African-American male diagnosed with stage IV non-small cell lung cancer, adenocarcinoma. He presented with a large right upper lobe lung mass with right hilar, subcarinal, paratracheal lymphadenopathy,and right supraclavicular lymphadenopathy. He also presented with bilateral pulmonary nodules. He  was diagnosed in February 2021. He does not have any actionable mutations.  The patient is currently undergoing palliative systemic chemotherapy with carboplatin for an AUC of 5, Alimta 500 mg/m, Keytruda 200 mg IV every 3 weeks. He is status post14cyclesand he tolerated it well without any adverse side effects. Starting from cycle #5, the patient has been on maintenance alimta and [place].  Alimta was held for the last two cycles due to elevated creatinine.   Labs were reviewed with Dr. Julien Nordmann today. Recommend that he proceed with cycle #15 today with single agent Keytruda.   I will arrange for a restaging CT scan of the chest, abdomen, and pelvis prior to starting the next cycle of treatment. I will order it without contrast due to his elevated creatinine.  We will see him back for a follow up visit in 3 weeks for evaluation and to review his scan results.   Regarding his hypertension, I had a lengthy discussion with the patient about the importance for management and being compliant with his anti-hypertensive medications. Discussed the risk of stroke, heart attack, and worsening kidney disease. Particularly, given his recently elevated creatinine, we discussed the importance of good BP control as well as management of his DM. The patient notes intermittent dizzy spells. I encouraged him to buy a BP cuff and monitor his BP during these episodes. Of course, if he develops stroke like symptoms he was advised to go to the ER. We will administer 0.2 mg of clonidine while in the clinic today. He was advised to take his BP meds as prescribed.   The patient was advised to call immediately if he has any concerning symptoms in the interval. The patient voices understanding of current disease status and treatment options and is in agreement with the current care plan. All questions were answered. The patient knows to call the clinic with any problems, questions or concerns. We can certainly see  the patient much sooner if necessary      Orders Placed This Encounter  Procedures  . CT Chest Wo Contrast    Standing Status:   Future    Standing Expiration Date:   10/28/2021    Order Specific Question:   Preferred imaging location?    Answer:   [facility]  . CT Abdomen Pelvis Wo Contrast    Standing Status:   Future    Standing Expiration Date:   10/28/2021    Order Specific Question:   Preferred imaging location?    Answer:   [facility]    Order Specific Question:   Release to patient    Answer:   Immediate    Order Specific Question:   Is Oral Contrast requested for this exam?    Answer:   Yes, Per Radiology protocol     Kaoir Loree L Keeleigh Terris, PA-C 10/28/20

## 2020-10-26 ENCOUNTER — Other Ambulatory Visit: Payer: No Typology Code available for payment source

## 2020-10-26 ENCOUNTER — Ambulatory Visit: Payer: No Typology Code available for payment source

## 2020-10-26 ENCOUNTER — Ambulatory Visit: Payer: No Typology Code available for payment source | Admitting: Physician Assistant

## 2020-10-28 ENCOUNTER — Encounter: Payer: Self-pay | Admitting: Physician Assistant

## 2020-10-28 ENCOUNTER — Inpatient Hospital Stay: Payer: No Typology Code available for payment source

## 2020-10-28 ENCOUNTER — Inpatient Hospital Stay (HOSPITAL_BASED_OUTPATIENT_CLINIC_OR_DEPARTMENT_OTHER): Payer: No Typology Code available for payment source | Admitting: Physician Assistant

## 2020-10-28 ENCOUNTER — Other Ambulatory Visit: Payer: Self-pay

## 2020-10-28 VITALS — BP 164/106 | HR 92 | Temp 97.7°F | Resp 20 | Ht 68.0 in | Wt 220.6 lb

## 2020-10-28 VITALS — BP 158/81 | HR 73

## 2020-10-28 DIAGNOSIS — Z5112 Encounter for antineoplastic immunotherapy: Secondary | ICD-10-CM

## 2020-10-28 DIAGNOSIS — C3491 Malignant neoplasm of unspecified part of right bronchus or lung: Secondary | ICD-10-CM

## 2020-10-28 DIAGNOSIS — Z95828 Presence of other vascular implants and grafts: Secondary | ICD-10-CM

## 2020-10-28 DIAGNOSIS — I10 Essential (primary) hypertension: Secondary | ICD-10-CM | POA: Diagnosis not present

## 2020-10-28 LAB — CMP (CANCER CENTER ONLY)
ALT: 40 U/L (ref 0–44)
AST: 26 U/L (ref 15–41)
Albumin: 3.4 g/dL — ABNORMAL LOW (ref 3.5–5.0)
Alkaline Phosphatase: 86 U/L (ref 38–126)
Anion gap: 9 (ref 5–15)
BUN: 13 mg/dL (ref 8–23)
CO2: 23 mmol/L (ref 22–32)
Calcium: 9.9 mg/dL (ref 8.9–10.3)
Chloride: 106 mmol/L (ref 98–111)
Creatinine: 2.18 mg/dL — ABNORMAL HIGH (ref 0.61–1.24)
GFR, Estimated: 33 mL/min — ABNORMAL LOW (ref >60)
Glucose, Bld: 96 mg/dL (ref 70–99)
Potassium: 4 mmol/L (ref 3.5–5.1)
Sodium: 138 mmol/L (ref 135–145)
Total Bilirubin: 0.4 mg/dL (ref 0.3–1.2)
Total Protein: 7.2 g/dL (ref 6.5–8.1)

## 2020-10-28 LAB — CBC WITH DIFFERENTIAL (CANCER CENTER ONLY)
Abs Immature Granulocytes: 0.02 10*3/uL (ref 0.00–0.07)
Basophils Absolute: 0 10*3/uL (ref 0.0–0.1)
Basophils Relative: 1 %
Eosinophils Absolute: 0.1 10*3/uL (ref 0.0–0.5)
Eosinophils Relative: 2 %
HCT: 34.9 % — ABNORMAL LOW (ref 39.0–52.0)
Hemoglobin: 12.2 g/dL — ABNORMAL LOW (ref 13.0–17.0)
Immature Granulocytes: 0 %
Lymphocytes Relative: 41 %
Lymphs Abs: 2.2 10*3/uL (ref 0.7–4.0)
MCH: 33.6 pg (ref 26.0–34.0)
MCHC: 35 g/dL (ref 30.0–36.0)
MCV: 96.1 fL (ref 80.0–100.0)
Monocytes Absolute: 0.6 10*3/uL (ref 0.1–1.0)
Monocytes Relative: 11 %
Neutro Abs: 2.4 10*3/uL (ref 1.7–7.7)
Neutrophils Relative %: 45 %
Platelet Count: 256 10*3/uL (ref 150–400)
RBC: 3.63 MIL/uL — ABNORMAL LOW (ref 4.22–5.81)
RDW: 13.1 % (ref 11.5–15.5)
WBC Count: 5.4 10*3/uL (ref 4.0–10.5)
nRBC: 0 % (ref 0.0–0.2)

## 2020-10-28 LAB — TSH: TSH: 0.72 u[IU]/mL (ref 0.320–4.118)

## 2020-10-28 MED ORDER — SODIUM CHLORIDE 0.9 % IV SOLN
Freq: Once | INTRAVENOUS | Status: AC
  Filled 2020-10-28: qty 250

## 2020-10-28 MED ORDER — SODIUM CHLORIDE 0.9 % IV SOLN
200.0000 mg | Freq: Once | INTRAVENOUS | Status: AC
  Administered 2020-10-28: 12:00:00 200 mg via INTRAVENOUS
  Filled 2020-10-28: qty 8

## 2020-10-28 MED ORDER — CLONIDINE HCL 0.1 MG PO TABS
ORAL_TABLET | ORAL | Status: AC
  Filled 2020-10-28: qty 2

## 2020-10-28 MED ORDER — SODIUM CHLORIDE 0.9% FLUSH
10.0000 mL | INTRAVENOUS | Status: DC | PRN
  Administered 2020-10-28: 13:00:00 10 mL
  Filled 2020-10-28: qty 10

## 2020-10-28 MED ORDER — CLONIDINE HCL 0.1 MG PO TABS
0.2000 mg | ORAL_TABLET | Freq: Once | ORAL | Status: AC
  Administered 2020-10-28: 11:00:00 0.2 mg via ORAL

## 2020-10-28 MED ORDER — HEPARIN SOD (PORK) LOCK FLUSH 100 UNIT/ML IV SOLN
500.0000 [IU] | Freq: Once | INTRAVENOUS | Status: AC | PRN
  Administered 2020-10-28: 13:00:00 500 [IU]
  Filled 2020-10-28: qty 5

## 2020-10-28 MED ORDER — SODIUM CHLORIDE 0.9% FLUSH
10.0000 mL | Freq: Once | INTRAVENOUS | Status: AC
  Administered 2020-10-28: 10:00:00 10 mL
  Filled 2020-10-28: qty 10

## 2020-10-28 NOTE — Progress Notes (Signed)
Per Cassie Heilingoetter, PA, patient is ok to treat BP of 164/106.

## 2020-10-28 NOTE — Patient Instructions (Signed)
[facility] Discharge Instructions for Patients Receiving Chemotherapy  Today you received the following chemotherapy agents :  Keytruda.  To help prevent nausea and vomiting after your treatment, we encourage you to take your nausea medication as prescribed.   If you develop nausea and vomiting that is not controlled by your nausea medication, call the clinic.   BELOW ARE SYMPTOMS THAT SHOULD BE REPORTED IMMEDIATELY:  *FEVER GREATER THAN 100.5 F  *CHILLS WITH OR WITHOUT FEVER  NAUSEA AND VOMITING THAT IS NOT CONTROLLED WITH YOUR NAUSEA MEDICATION  *UNUSUAL SHORTNESS OF BREATH  *UNUSUAL BRUISING OR BLEEDING  TENDERNESS IN MOUTH AND THROAT WITH OR WITHOUT PRESENCE OF ULCERS  *URINARY PROBLEMS  *BOWEL PROBLEMS  UNUSUAL RASH Items with * indicate a potential emergency and should be followed up as soon as possible.  Feel free to call the clinic should you have any questions or concerns. The clinic phone number is [phone].  Please show the CHEMO ALERT CARD at check-in to the Emergency Department and triage nurse.  

## 2020-10-29 ENCOUNTER — Telehealth: Payer: Self-pay | Admitting: Physician Assistant

## 2020-10-29 NOTE — Telephone Encounter (Signed)
Scheduled appointments per 12/22 los. Spoke to patient who is aware of appointments dates and times.

## 2020-11-06 ENCOUNTER — Inpatient Hospital Stay (HOSPITAL_COMMUNITY): Admission: RE | Admit: 2020-11-06 | Payer: No Typology Code available for payment source | Source: Ambulatory Visit

## 2020-11-09 ENCOUNTER — Other Ambulatory Visit (HOSPITAL_COMMUNITY): Payer: No Typology Code available for payment source

## 2020-11-11 ENCOUNTER — Ambulatory Visit: Payer: No Typology Code available for payment source | Admitting: Acute Care

## 2020-11-11 ENCOUNTER — Ambulatory Visit: Payer: No Typology Code available for payment source | Admitting: Primary Care

## 2020-11-14 ENCOUNTER — Other Ambulatory Visit (HOSPITAL_COMMUNITY)
Admission: RE | Admit: 2020-11-14 | Discharge: 2020-11-14 | Disposition: A | Discharge status: Home / Self Care | Payer: No Typology Code available for payment source | Source: Ambulatory Visit | Attending: Primary Care | Admitting: Primary Care

## 2020-11-14 DIAGNOSIS — Z01812 Encounter for preprocedural laboratory examination: Secondary | ICD-10-CM | POA: Insufficient documentation

## 2020-11-14 DIAGNOSIS — Z20822 Contact with and (suspected) exposure to covid-19: Secondary | ICD-10-CM | POA: Insufficient documentation

## 2020-11-15 LAB — SARS CORONAVIRUS 2 (TAT 6-24 HRS): SARS Coronavirus 2: NEGATIVE

## 2020-11-16 ENCOUNTER — Other Ambulatory Visit: Payer: No Typology Code available for payment source

## 2020-11-16 ENCOUNTER — Other Ambulatory Visit: Payer: Self-pay

## 2020-11-16 ENCOUNTER — Ambulatory Visit: Payer: No Typology Code available for payment source | Admitting: Internal Medicine

## 2020-11-16 ENCOUNTER — Ambulatory Visit: Payer: No Typology Code available for payment source

## 2020-11-16 ENCOUNTER — Ambulatory Visit (HOSPITAL_COMMUNITY)
Admission: RE | Admit: 2020-11-16 | Discharge: 2020-11-16 | Disposition: A | Discharge status: Home / Self Care | Payer: No Typology Code available for payment source | Source: Ambulatory Visit | Attending: Physician Assistant | Admitting: Physician Assistant

## 2020-11-16 DIAGNOSIS — C3491 Malignant neoplasm of unspecified part of right bronchus or lung: Secondary | ICD-10-CM | POA: Diagnosis not present

## 2020-11-18 ENCOUNTER — Other Ambulatory Visit: Payer: Self-pay

## 2020-11-18 ENCOUNTER — Ambulatory Visit (INDEPENDENT_AMBULATORY_CARE_PROVIDER_SITE_OTHER): Payer: No Typology Code available for payment source | Admitting: Pulmonary Disease

## 2020-11-18 DIAGNOSIS — R0609 Other forms of dyspnea: Secondary | ICD-10-CM

## 2020-11-18 DIAGNOSIS — R0600 Dyspnea, unspecified: Secondary | ICD-10-CM | POA: Diagnosis not present

## 2020-11-18 LAB — PULMONARY FUNCTION TEST
DL/VA % pred: 88 %
DL/VA: 3.68 ml/min/mmHg/L
DLCO cor % pred: 57 %
DLCO cor: 15.01 ml/min/mmHg
DLCO unc % pred: 53 %
DLCO unc: 13.88 ml/min/mmHg
FEF 25-75 Post: 2.01 L/sec
FEF 25-75 Pre: 1.19 L/sec
FEF2575-%Change-Post: 68 %
FEF2575-%Pred-Post: 77 %
FEF2575-%Pred-Pre: 45 %
FEV1-%Change-Post: 24 %
FEV1-%Pred-Post: 75 %
FEV1-%Pred-Pre: 60 %
FEV1-Post: 2.18 L
FEV1-Pre: 1.75 L
FEV1FVC-%Change-Post: 24 %
FEV1FVC-%Pred-Pre: 81 %
FEV6-%Change-Post: 0 %
FEV6-%Pred-Post: 76 %
FEV6-%Pred-Pre: 76 %
FEV6-Post: 2.77 L
FEV6-Pre: 2.77 L
FEV6FVC-%Pred-Post: 104 %
FEV6FVC-%Pred-Pre: 104 %
FVC-%Change-Post: 0 %
FVC-%Pred-Post: 73 %
FVC-%Pred-Pre: 73 %
FVC-Post: 2.77 L
FVC-Pre: 2.77 L
Post FEV1/FVC ratio: 79 %
Post FEV6/FVC ratio: 100 %
Pre FEV1/FVC ratio: 63 %
Pre FEV6/FVC Ratio: 100 %
RV % pred: 96 %
RV: 2.22 L
TLC % pred: 77 %
TLC: 5.25 L

## 2020-11-18 NOTE — Progress Notes (Signed)
Full PFT performed today. °

## 2020-11-19 ENCOUNTER — Inpatient Hospital Stay: Payer: No Typology Code available for payment source

## 2020-11-19 ENCOUNTER — Inpatient Hospital Stay: Payer: No Typology Code available for payment source | Attending: Internal Medicine

## 2020-11-19 ENCOUNTER — Other Ambulatory Visit: Payer: Self-pay

## 2020-11-19 ENCOUNTER — Telehealth: Payer: Self-pay | Admitting: Medical Oncology

## 2020-11-19 ENCOUNTER — Inpatient Hospital Stay (HOSPITAL_BASED_OUTPATIENT_CLINIC_OR_DEPARTMENT_OTHER): Payer: No Typology Code available for payment source | Admitting: Internal Medicine

## 2020-11-19 VITALS — BP 154/94 | HR 72 | Temp 97.5°F | Resp 18 | Ht 68.0 in | Wt 222.3 lb

## 2020-11-19 DIAGNOSIS — I129 Hypertensive chronic kidney disease with stage 1 through stage 4 chronic kidney disease, or unspecified chronic kidney disease: Secondary | ICD-10-CM | POA: Insufficient documentation

## 2020-11-19 DIAGNOSIS — C3491 Malignant neoplasm of unspecified part of right bronchus or lung: Secondary | ICD-10-CM | POA: Diagnosis not present

## 2020-11-19 DIAGNOSIS — N189 Chronic kidney disease, unspecified: Secondary | ICD-10-CM | POA: Diagnosis not present

## 2020-11-19 DIAGNOSIS — Z95828 Presence of other vascular implants and grafts: Secondary | ICD-10-CM

## 2020-11-19 DIAGNOSIS — Z79899 Other long term (current) drug therapy: Secondary | ICD-10-CM | POA: Insufficient documentation

## 2020-11-19 DIAGNOSIS — I10 Essential (primary) hypertension: Secondary | ICD-10-CM

## 2020-11-19 DIAGNOSIS — Z5112 Encounter for antineoplastic immunotherapy: Secondary | ICD-10-CM | POA: Diagnosis not present

## 2020-11-19 DIAGNOSIS — C3411 Malignant neoplasm of upper lobe, right bronchus or lung: Secondary | ICD-10-CM | POA: Diagnosis present

## 2020-11-19 DIAGNOSIS — Z452 Encounter for adjustment and management of vascular access device: Secondary | ICD-10-CM | POA: Diagnosis not present

## 2020-11-19 DIAGNOSIS — M7989 Other specified soft tissue disorders: Secondary | ICD-10-CM | POA: Diagnosis not present

## 2020-11-19 LAB — CMP (CANCER CENTER ONLY)
ALT: 42 U/L (ref 0–44)
AST: 24 U/L (ref 15–41)
Albumin: 3.4 g/dL — ABNORMAL LOW (ref 3.5–5.0)
Alkaline Phosphatase: 86 U/L (ref 38–126)
Anion gap: 11 (ref 5–15)
BUN: 30 mg/dL — ABNORMAL HIGH (ref 8–23)
CO2: 24 mmol/L (ref 22–32)
Calcium: 10.1 mg/dL (ref 8.9–10.3)
Chloride: 104 mmol/L (ref 98–111)
Creatinine: 2.45 mg/dL — ABNORMAL HIGH (ref 0.61–1.24)
GFR, Estimated: 28 mL/min — ABNORMAL LOW (ref >60)
Glucose, Bld: 95 mg/dL (ref 70–99)
Potassium: 4 mmol/L (ref 3.5–5.1)
Sodium: 139 mmol/L (ref 135–145)
Total Bilirubin: 0.3 mg/dL (ref 0.3–1.2)
Total Protein: 7.3 g/dL (ref 6.5–8.1)

## 2020-11-19 LAB — CBC WITH DIFFERENTIAL (CANCER CENTER ONLY)
Abs Immature Granulocytes: 0.02 10*3/uL (ref 0.00–0.07)
Basophils Absolute: 0 10*3/uL (ref 0.0–0.1)
Basophils Relative: 1 %
Eosinophils Absolute: 0.3 10*3/uL (ref 0.0–0.5)
Eosinophils Relative: 4 %
HCT: 34.3 % — ABNORMAL LOW (ref 39.0–52.0)
Hemoglobin: 11.7 g/dL — ABNORMAL LOW (ref 13.0–17.0)
Immature Granulocytes: 0 %
Lymphocytes Relative: 37 %
Lymphs Abs: 2.4 10*3/uL (ref 0.7–4.0)
MCH: 32.7 pg (ref 26.0–34.0)
MCHC: 34.1 g/dL (ref 30.0–36.0)
MCV: 95.8 fL (ref 80.0–100.0)
Monocytes Absolute: 0.7 10*3/uL (ref 0.1–1.0)
Monocytes Relative: 12 %
Neutro Abs: 2.9 10*3/uL (ref 1.7–7.7)
Neutrophils Relative %: 46 %
Platelet Count: 225 10*3/uL (ref 150–400)
RBC: 3.58 MIL/uL — ABNORMAL LOW (ref 4.22–5.81)
RDW: 13.1 % (ref 11.5–15.5)
WBC Count: 6.3 10*3/uL (ref 4.0–10.5)
nRBC: 0 % (ref 0.0–0.2)

## 2020-11-19 LAB — TSH: TSH: 0.708 u[IU]/mL (ref 0.320–4.118)

## 2020-11-19 MED ORDER — SODIUM CHLORIDE 0.9% FLUSH
10.0000 mL | Freq: Once | INTRAVENOUS | Status: AC
  Administered 2020-11-19: 10 mL
  Filled 2020-11-19: qty 10

## 2020-11-19 MED ORDER — PREDNISONE 20 MG PO TABS
ORAL_TABLET | ORAL | 0 refills | Status: DC
Start: 2020-11-19 — End: 2021-01-21

## 2020-11-19 MED ORDER — HEPARIN SOD (PORK) LOCK FLUSH 100 UNIT/ML IV SOLN
500.0000 [IU] | Freq: Once | INTRAVENOUS | Status: AC
  Administered 2020-11-19: 500 [IU]
  Filled 2020-11-19: qty 5

## 2020-11-19 MED ORDER — PREDNISONE 20 MG PO TABS: ORAL_TABLET | ORAL | 0 refills | Status: DC

## 2020-11-19 NOTE — Progress Notes (Signed)
Per Dr. Julien Nordmann, pt will not be receiving treatment today d/t Scr of 2.45. pt was deaccessed and placed back in blue room to follow up with RN.

## 2020-11-19 NOTE — Patient Instructions (Signed)

## 2020-11-19 NOTE — Telephone Encounter (Signed)
Prednisone sent electronically to [facility]

## 2020-11-19 NOTE — Progress Notes (Signed)
[place] [phone]   [phone]  OFFICE PROGRESS NOTE  [name], MD [address] [place]  [place] [address]  DIAGNOSIS: Stage IV (T2b, N3, M1a) non-small cell lung cancer, adenocarcinoma presented with large right upper lobe lung mass in addition to mediastinal and right supraclavicular lymphadenopathy as well as bilateral pulmonary nodules diagnosed in February 2021.  MOLECULAR STUDY by [employer]:  KRASG12C, 4.3%, Binimetinib  [id number], 0.9%, Niraparib, Olaparib, Rucaparib,Talazoparib, Tazemetostat  [id number], 1.7%, None   PRIOR THERAPY: None  CURRENT THERAPY: Systemic chemotherapy with carboplatin for AUC of 5, Alimta 500 mg/M2 and Keytruda 200 mg IV every 3 weeks.  First dose December 31, 2019.  Status post 12 cycles.  The last few cycles he has been treated with single agent Keytruda secondary to renal insufficiency.  INTERVAL HISTORY: Kevin Lambert. 65 y.o. male returns to the clinic today for follow-up visit.  The patient is feeling fine today with no concerning complaints.  He has no current chest pain, shortness of breath, cough or hemoptysis.  He has no nausea, vomiting, diarrhea but has constipation for several days.  He denied having any headache or visual changes.  He has no weight loss or night sweats.  He continues to tolerate his treatment with Keytruda fairly well.  He was noted over the last few weeks to have worsening renal function.  The patient had repeat CT scan of the chest, abdomen pelvis performed recently and he is here for evaluation and discussion of his discuss results.  MEDICAL HISTORY: Past Medical History:  Diagnosis Date  . Arthritis   . Chronic kidney disease   . Depression   . DM (diabetes mellitus) ([place])    type II  . Dyslipidemia   . Dyspnea    unable to walk much - he thinks it is from   . GERD (gastroesophageal reflux disease)   . Hepatitis    Hepatitis C- treated  . History of  kidney stones   . HTN (hypertension)   . Neuropathy   . nscl ca dx'd 10/2019  . OSA on CPAP   . PTSD (post-traumatic stress disorder)     ALLERGIES:  is allergic to metformin and related and naproxen.  MEDICATIONS:  Current Outpatient Medications  Medication Sig Dispense Refill  . albuterol (VENTOLIN HFA) 108 (90 Base) MCG/ACT inhaler Inhale 2 puffs into the lungs every 4 (four) hours as needed for wheezing or shortness of breath. 18 g 0  . amLODipine (NORVASC) 10 MG tablet Take 10 mg by mouth daily.    . Ascorbic Acid (VITAMIN C) 1000 MG tablet Take 1,000 mg by mouth daily.    Marland Kitchen aspirin EC 81 MG tablet Take 81 mg by mouth daily.    Marland Kitchen atorvastatin (LIPITOR) 10 MG tablet Take 10 mg by mouth daily.    . bisacodyl (DULCOLAX) 5 MG EC tablet Take 5 mg by mouth daily as needed for moderate constipation.    . cholecalciferol (VITAMIN D3) 25 MCG (1000 UNIT) tablet Take 1,000 Units by mouth daily.    . Cyanocobalamin (VITAMIN B 12 PO) Take 1 tablet by mouth daily.    . folic acid (FOLVITE) 1 MG tablet Take 1 tablet (1 mg total) by mouth daily. Pt needs to pick up today and start ASAP . He will pay out of pocket. He is waiting on the [place] to send his folic acid which will be next week. (Patient taking differently: Take 1 mg by  mouth daily.) 7 tablet 0  . furosemide (LASIX) 20 MG tablet Take 1 tablet (20 mg total) by mouth 2 (two) times daily. (Patient taking differently: Take 20 mg by mouth 2 (two) times daily. Pt takes once a week) 20 tablet 0  . glipiZIDE (GLUCOTROL) 5 MG tablet Take 5 mg by mouth daily before breakfast.     . lidocaine-prilocaine (EMLA) cream Apply to Port-A-Cath site 30-60-minute before treatment. 30 g 0  . magic mouthwash SOLN Take 5 mLs by mouth 4 (four) times daily as needed for mouth pain. Swish and swallow or spit 240 mL 2  . nicotine (NICODERM CQ) 21 mg/24hr patch Place 1 patch (21 mg total) onto the skin daily. 28 patch 3  . nicotine polacrilex (COMMIT) 4 MG lozenge Take 1  lozenge (4 mg total) by mouth as needed for smoking cessation. 108 tablet 3  . potassium chloride SA (KLOR-CON) 20 MEQ tablet Take 1 tablet (20 mEq total) by mouth daily. 10 tablet 0  . pregabalin (LYRICA) 25 MG capsule Take 1 capsule (25 mg total) by mouth daily. 30 capsule 1  . Propylene Glycol (SYSTANE BALANCE OP) Place 1 drop into both eyes daily.    Marland Kitchen triamterene-hydrochlorothiazide (MAXZIDE-25) 37.5-25 MG tablet Take 1 tablet by mouth daily.    Marland Kitchen umeclidinium-vilanterol (ANORO ELLIPTA) 62.5-25 MCG/INH AEPB Inhale 1 puff into the lungs daily. 60 each 11  . umeclidinium-vilanterol (ANORO ELLIPTA) 62.5-25 MCG/INH AEPB Inhale 1 puff into the lungs daily. 14 each 0  . zinc gluconate 50 MG tablet Take 50 mg by mouth daily.     No current facility-administered medications for this visit.    SURGICAL HISTORY:  Past Surgical History:  Procedure Laterality Date  . BIOPSY OF MEDIASTINAL MASS  12/10/2019   Procedure: BIOPSY OF MEDIASTINAL MASS;  Surgeon: Garner Nash, DO;  Location: [facility];  Service: Pulmonary;;  right upper lobe  . BRONCHIAL NEEDLE ASPIRATION BIOPSY  12/10/2019   Procedure: BRONCHIAL NEEDLE ASPIRATION BIOPSIES;  Surgeon: Garner Nash, DO;  Location: [facility];  Service: Pulmonary;;  . COLONOSCOPY    . IR IMAGING GUIDED PORT INSERTION  12/31/2019  . kidney stone    . URETERAL STENT PLACEMENT    . Ureteral Stent Removed    . VIDEO BRONCHOSCOPY WITH ENDOBRONCHIAL ULTRASOUND N/A 12/10/2019   Procedure: VIDEO BRONCHOSCOPY WITH ENDOBRONCHIAL ULTRASOUND;  Surgeon: Garner Nash, DO;  Location: [facility];  Service: Pulmonary;  Laterality: N/A;    REVIEW OF SYSTEMS:  Constitutional: positive for fatigue Eyes: negative Ears, nose, mouth, throat, and face: negative Respiratory: negative Cardiovascular: negative Gastrointestinal: positive for constipation Genitourinary:negative Integument/breast: negative Hematologic/lymphatic:  negative Musculoskeletal:negative Neurological: negative Behavioral/Psych: negative Endocrine: negative Allergic/Immunologic: negative   PHYSICAL EXAMINATION: General appearance: alert, cooperative, fatigued and no distress Head: Normocephalic, without obvious abnormality, atraumatic Neck: no adenopathy, no JVD, supple, symmetrical, trachea midline and thyroid not enlarged, symmetric, no tenderness/mass/nodules Lymph nodes: Cervical, supraclavicular, and axillary nodes normal. Resp: clear to auscultation bilaterally Back: symmetric, no curvature. ROM normal. No CVA tenderness. Cardio: regular rate and rhythm, S1, S2 normal, no murmur, click, rub or gallop GI: soft, non-tender; bowel sounds normal; no masses,  no organomegaly Extremities: extremities normal, atraumatic, no cyanosis or edema Neurologic: Alert and oriented X 3, normal strength and tone. Normal symmetric reflexes. Normal coordination and gait  ECOG PERFORMANCE STATUS: 1 - Symptomatic but completely ambulatory  Blood pressure (!) 154/94, pulse 72, temperature (!) 97.5 F (36.4 C), temperature source Tympanic, resp. rate 18, height  5\' 8"  (1.727 m), weight 222 lb 4.8 oz (100.8 kg), SpO2 100 %.  LABORATORY DATA: Lab Results  Component Value Date   WBC 6.3 11/19/2020   HGB 11.7 (L) 11/19/2020   HCT 34.3 (L) 11/19/2020   MCV 95.8 11/19/2020   PLT 225 11/19/2020      Chemistry      Component Value Date/Time   NA 138 10/28/2020 0955   K 4.0 10/28/2020 0955   CL 106 10/28/2020 0955   CO2 23 10/28/2020 0955   BUN 13 10/28/2020 0955   CREATININE 2.18 (H) 10/28/2020 0955      Component Value Date/Time   CALCIUM 9.9 10/28/2020 0955   ALKPHOS 86 10/28/2020 0955   AST 26 10/28/2020 0955   ALT 40 10/28/2020 0955   BILITOT 0.4 10/28/2020 0955       RADIOGRAPHIC STUDIES: CT Abdomen Pelvis Wo Contrast  Result Date: 11/16/2020 CLINICAL DATA:  Stage IV right upper lobe non-small cell lung cancer. Assess treatment  response. EXAM: CT CHEST, ABDOMEN AND PELVIS WITHOUT CONTRAST TECHNIQUE: Multidetector CT imaging of the chest, abdomen and pelvis was performed following the standard protocol without IV contrast. COMPARISON:  09/11/2020 CT chest, abdomen and pelvis. FINDINGS: CT CHEST FINDINGS Cardiovascular: Normal heart size. No significant pericardial effusion/thickening. Left anterior descending and right coronary atherosclerosis. Right internal jugular Port-A-Cath terminates at the cavoatrial junction. Atherosclerotic nonaneurysmal thoracic aorta. Dilated main pulmonary artery (3.5 cm diameter), stable. Mediastinum/Nodes: No discrete thyroid nodules. Unremarkable esophagus. No axillary adenopathy. Stable top-normal size 0.9 cm right paratracheal (series 2/image 19) and 0.9 cm subcarinal (series 2/image 26) nodes. No pathologically enlarged mediastinal nodes. No discrete hilar adenopathy on these noncontrast images. Lungs/Pleura: No pneumothorax. Resolved bilateral pleural effusions. Posterior right upper lobe 3.1 x 2.2 cm solid lung mass (series 6/image 50), previously 3.7 x 2.9 cm, decreased. No acute consolidative airspace disease or new significant pulmonary nodules. Musculoskeletal: No aggressive appearing focal osseous lesions. Marked thoracic spondylosis. CT ABDOMEN PELVIS FINDINGS Hepatobiliary: Normal liver with no liver mass. Normal gallbladder with no radiopaque cholelithiasis. No biliary ductal dilatation. Pancreas: Normal, with no mass or duct dilation. Spleen: Normal size. No mass. Adrenals/Urinary Tract: Right adrenal 2.4 cm nodule with density 6 HU, stable, compatible with an adenoma. Left adrenal 1.6 cm nodule with density 20 HU, stable, previously non hypermetabolic, compatible with an adenoma. Nonobstructing 5 mm lower left renal stone. No right renal stones. No hydronephrosis. No contour deforming renal masses. Normal bladder. Stomach/Bowel: Normal non-distended stomach. Normal caliber small bowel with no  small bowel wall thickening. Normal appendix. Oral contrast transits to the large bowel. Normal large bowel with no diverticulosis, large bowel wall thickening or pericolonic fat stranding. Vascular/Lymphatic: Atherosclerotic nonaneurysmal abdominal aorta. No pathologically enlarged lymph nodes in the abdomen or pelvis. Reproductive: Top-normal size prostate. Other: No pneumoperitoneum, ascites or focal fluid collection. Musculoskeletal: No aggressive appearing focal osseous lesions. Marked lumbar spondylosis. IMPRESSION: 1. Decreased posterior right upper lobe lung mass. 2. No evidence of metastatic disease in the chest, abdomen or pelvis. 3. Resolved bilateral pleural effusions. 4. Stable bilateral adrenal adenomas. 5. Aortic Atherosclerosis (ICD10-I70.0). Electronically Signed   By: Ilona Sorrel M.D.   On: 11/16/2020 11:52   CT Chest Wo Contrast  Result Date: 11/16/2020 CLINICAL DATA:  Stage IV right upper lobe non-small cell lung cancer. Assess treatment response. EXAM: CT CHEST, ABDOMEN AND PELVIS WITHOUT CONTRAST TECHNIQUE: Multidetector CT imaging of the chest, abdomen and pelvis was performed following the standard protocol without IV contrast. COMPARISON:  09/11/2020 CT chest, abdomen and pelvis. FINDINGS: CT CHEST FINDINGS Cardiovascular: Normal heart size. No significant pericardial effusion/thickening. Left anterior descending and right coronary atherosclerosis. Right internal jugular Port-A-Cath terminates at the cavoatrial junction. Atherosclerotic nonaneurysmal thoracic aorta. Dilated main pulmonary artery (3.5 cm diameter), stable. Mediastinum/Nodes: No discrete thyroid nodules. Unremarkable esophagus. No axillary adenopathy. Stable top-normal size 0.9 cm right paratracheal (series 2/image 19) and 0.9 cm subcarinal (series 2/image 26) nodes. No pathologically enlarged mediastinal nodes. No discrete hilar adenopathy on these noncontrast images. Lungs/Pleura: No pneumothorax. Resolved bilateral  pleural effusions. Posterior right upper lobe 3.1 x 2.2 cm solid lung mass (series 6/image 50), previously 3.7 x 2.9 cm, decreased. No acute consolidative airspace disease or new significant pulmonary nodules. Musculoskeletal: No aggressive appearing focal osseous lesions. Marked thoracic spondylosis. CT ABDOMEN PELVIS FINDINGS Hepatobiliary: Normal liver with no liver mass. Normal gallbladder with no radiopaque cholelithiasis. No biliary ductal dilatation. Pancreas: Normal, with no mass or duct dilation. Spleen: Normal size. No mass. Adrenals/Urinary Tract: Right adrenal 2.4 cm nodule with density 6 HU, stable, compatible with an adenoma. Left adrenal 1.6 cm nodule with density 20 HU, stable, previously non hypermetabolic, compatible with an adenoma. Nonobstructing 5 mm lower left renal stone. No right renal stones. No hydronephrosis. No contour deforming renal masses. Normal bladder. Stomach/Bowel: Normal non-distended stomach. Normal caliber small bowel with no small bowel wall thickening. Normal appendix. Oral contrast transits to the large bowel. Normal large bowel with no diverticulosis, large bowel wall thickening or pericolonic fat stranding. Vascular/Lymphatic: Atherosclerotic nonaneurysmal abdominal aorta. No pathologically enlarged lymph nodes in the abdomen or pelvis. Reproductive: Top-normal size prostate. Other: No pneumoperitoneum, ascites or focal fluid collection. Musculoskeletal: No aggressive appearing focal osseous lesions. Marked lumbar spondylosis. IMPRESSION: 1. Decreased posterior right upper lobe lung mass. 2. No evidence of metastatic disease in the chest, abdomen or pelvis. 3. Resolved bilateral pleural effusions. 4. Stable bilateral adrenal adenomas. 5. Aortic Atherosclerosis (ICD10-I70.0). Electronically Signed   By: Ilona Sorrel M.D.   On: 11/16/2020 11:52    ASSESSMENT AND PLAN: This is a very pleasant 65 years old African-American male recently diagnosed with a stage IV non-small  cell lung cancer, adenocarcinoma with no actionable mutations presented with large right lower lobe lung mass in addition to mediastinal and right supraclavicular lymphadenopathy as well as bilateral pulmonary nodules diagnosed in February 2021. I explained to the patient that he has incurable condition and all the treatment options will be of palliative nature. The patient has no actionable mutations on the recent molecular studies and he is not a candidate for the [facility] clinical trial. He started systemic chemotherapy with carboplatin, Alimta and Keytruda status post 12 cycles.  Starting from cycle #5 the patient is on maintenance treatment with Alimta and Keytruda every 3 weeks. The patient has been treated with single agent Keytruda the last few cycles because of the renal insufficiency. He had repeat CT scan of the chest, abdomen pelvis performed recently.  I personally and independently reviewed the scans and discussed the results with the patient today. His scan showed no concerning findings for disease progression. I recommended for the patient to continue his current treatment with immunotherapy but I will hold cycle #13 for few weeks until improvement of his renal insufficiency because of concern about immunotherapy mediated nephritis but this could be also secondary to his aggressive diuresis over the last few weeks for the swelling of the upper and lower extremities. I will start the patient on a tapered dose of prednisone  over the next 3 weeks. For the hypertension he will continue to monitor his blood pressure closely and take his medication as prescribed. The patient was advised to call immediately if he has any other concerning symptoms in the interval. The patient voices understanding of current disease status and treatment options and is in agreement with the current care plan.  All questions were answered. The patient knows to call the clinic with any problems, questions or  concerns. We can certainly see the patient much sooner if necessary.  Disclaimer: This note was dictated with voice recognition software. Similar sounding words can inadvertently be transcribed and may not be corrected upon review.

## 2020-11-20 ENCOUNTER — Ambulatory Visit (INDEPENDENT_AMBULATORY_CARE_PROVIDER_SITE_OTHER): Payer: No Typology Code available for payment source | Admitting: Pulmonary Disease

## 2020-11-20 ENCOUNTER — Encounter: Payer: Self-pay | Admitting: Pulmonary Disease

## 2020-11-20 VITALS — BP 136/86 | HR 92 | Temp 99.4°F | Ht 69.0 in | Wt 222.4 lb

## 2020-11-20 DIAGNOSIS — C3491 Malignant neoplasm of unspecified part of right bronchus or lung: Secondary | ICD-10-CM | POA: Diagnosis not present

## 2020-11-20 DIAGNOSIS — R0600 Dyspnea, unspecified: Secondary | ICD-10-CM

## 2020-11-20 DIAGNOSIS — Z9189 Other specified personal risk factors, not elsewhere classified: Secondary | ICD-10-CM | POA: Diagnosis not present

## 2020-11-20 DIAGNOSIS — Z720 Tobacco use: Secondary | ICD-10-CM | POA: Diagnosis not present

## 2020-11-20 DIAGNOSIS — Z0000 Encounter for general adult medical examination without abnormal findings: Secondary | ICD-10-CM

## 2020-11-20 DIAGNOSIS — R0609 Other forms of dyspnea: Secondary | ICD-10-CM

## 2020-11-20 NOTE — Assessment & Plan Note (Signed)
Reviewed pulmonary function testing today Patient did not notice clinical benefit with Anoro Ellipta trial  Plan: We will continue clinically monitor Emphasized the patient that it is important that he work to stop smoking

## 2020-11-20 NOTE — Assessment & Plan Note (Signed)
Active smoker Smoking 4 cigarettes a day 30-pack-year smoking history  Plan: Strongly recommend the patient stop smoking Recommend nicotine replacement therapies as previously discussed

## 2020-11-20 NOTE — Assessment & Plan Note (Signed)
Plan: Continue follow-up with oncology Continue CT imaging follow-up as recommended by oncology

## 2020-11-20 NOTE — Patient Instructions (Addendum)
You were seen today by Lauraine Rinne, NP  for:   1. DOE (dyspnea on exertion)  Walk today in office  We will continue to clinically monitor you  We have tried you on inhalers in the past and you did not see clinical benefit  Strongly recommend that you stop smoking as discussed today  2. Non-small cell cancer of right lung [facility])  Keep follow-up with Dr. Earlie Server  Continue CT imaging as outlined by oncology  3. At risk for obstructive sleep apnea  We have ordered a home sleep study in the past to further evaluate obstructive sleep apnea  We will check with our patient care coordinators to see why this has not been completed  4. Tobacco abuse  We recommend that you stop smoking.  >>>You need to set a quit date >>>If you have friends or family who smoke, let them know you are trying to quit and not to smoke around you or in your living environment  Smoking Cessation Resources:  1 800 QUIT NOW  >>> Patient to call this resource and utilize it to help support her quit smoking >>> Keep up your hard work with stopping smoking  You can also contact the [facility] >>>For smoking cessation classes call [phone]  We do not recommend using e-cigarettes as a form of stopping smoking  You can sign up for smoking cessation support texts and information:  >>>https://smokefree.gov/smokefreetxt   5. Healthcare maintenance  Obtain COVID-19 vaccination today as discussed  Would also recommend seasonal flu vaccination   Follow Up:    Return in about 6 months (around 05/20/2021), or if symptoms worsen or fail to improve, for Follow up with Dr. Valeta Harms.   Notification of test results are managed in the following manner: If there are  any recommendations or changes to the  plan of care discussed in office today,  we will contact you and let you know what they are. If you do not hear from [place], then your results are normal and you can view them through your  MyChart  account , or a letter will be sent to you. Thank you again for trusting [place] with your care  - Thank you, [facility]    It is flu season:   >>> Best ways to protect herself from the flu: Receive the yearly flu vaccine, practice good hand hygiene washing with soap and also using hand sanitizer when available, eat a nutritious meals, get adequate rest, hydrate appropriately       Please contact the office if your symptoms worsen or you have concerns that you are not improving.   Thank you for choosing [facility] for your healthcare, and for allowing [place] to partner with you on your healthcare journey. I am thankful to be able to provide care to you today.   Wyn Quaker FNP-C    American Journal of Respiratory and Critical Care Medicine, [phone]), e5-e31. http://knight.com/.400867-6195KD">  Health Risks of Smoking Smoking tobacco is very bad for your health. Tobacco smoke contains many toxic chemicals that can damage every part of your body. Secondhand smoke can be harmful to those around you. Tobacco or nicotine use can cause many long-term (chronic) diseases. Smoking is difficult to quit because a chemical in tobacco, called nicotine, causes addiction or dependence. When you smoke and inhale, nicotine is absorbed quickly into the bloodstream through your lungs. Both inhaled and non-inhaled nicotine may be addictive. How can quitting affect me? There are health benefits  of quitting smoking. Some benefits happen right away and others take time. Benefits may include:  Blood flow, blood pressure, heart rate, and lung capacity may begin to improve. However, any lung damage that has already occurred cannot be repaired.  Temporary respiratory symptoms, such as nasal congestion and cough, may improve over time.  Your risk of heart disease, stroke, and cancer is reduced.  The overall quality of your health may improve.  You may save money, as you will not spend money  on tobacco products and may spend less money on smoking-related health issues. What can increase my risk? Smoking harms nearly every organ in the body. People who smoke tobacco have a shorter life expectancy and an increased risk of many serious medical problems. These include:  More respiratory infections, such as colds and pneumonia.  Cancer.  Heart disease.  Stroke.  Chronic respiratory diseases.  Delayed wound healing and increased risk of complications during surgery.  Problems with reproduction, pregnancy, and childbirth, such as infertility, early (premature) births, stillbirths, and birth defects. Secondhand smoke exposure to children increases the risk of:  Sudden infant death syndrome (SIDS).  Infections in the nose, throat, or airways (respiratory infections).  Chronic respiratory symptoms.   What actions can I take to quit? Smoking is an addiction that affects both your body and your mind, and long-time habits can be hard to change. Your health care provider can recommend:  Nicotine replacement products, such as patches, gum, and nasal sprays. Use these products only as directed. Do not replace cigarette smoking with electronic cigarettes, which are commonly called e-cigarettes. The safety of e-cigarettes is not known, and some may contain harmful chemicals.  Programs and community resources, which may include group support, education, or talk therapy.  Prescription medicines to help reduce cravings.  A combination of two or more quit methods, which will increase the success of quitting.   Where to find support Follow the recommendations from your health care provider about support groups and other assistance. You can also visit:  [employer]: www.naquitline.org or call 1-800-QUIT-NOW.  U.S. Department of Health and Human Services: www.smokefree.gov  American Lung Association: www.freedomfromsmoking.org  American Heart Association:  www.heart.org Where to find more information  Centers for Disease Control and Prevention: http://www.wolf.info/  World Health Organization: RoleLink.com.br Summary  Smoking tobacco is very bad for your health. Tobacco smoke contains many toxic chemicals that can damage every part of the body.  Smoking is difficult to quit because a chemical in tobacco, called nicotine, causes addiction or dependence.  There are immediate and long-term health benefits of quitting smoking.  A combination of two or more quit methods increases the success of quitting. This information is not intended to replace advice given to you by your health care provider. Make sure you discuss any questions you have with your health care provider. Document Revised: 12/09/2019 Document Reviewed: 12/09/2019 Elsevier Patient Education  2021 [employer].

## 2020-11-20 NOTE — Assessment & Plan Note (Signed)
Plan: Will recommend home sleep study

## 2020-11-20 NOTE — Progress Notes (Signed)
@Patient  ID: Kevin Flavin., male    DOB: 07-Mar-1956, 65 y.o.   MRN: [id number]  Chief Complaint  Patient presents with  . Follow-up    Review of PFT     Referring provider: [name], MD  HPI:  65 year old male current everyday smoker followed in our office for non-small cell lung cancer stage IV  PMH: Obstructive sleep apnea, hypertension, type 2 diabetes Smoker/ Smoking History: Current smoker.  4 cigarettes a day.  30-pack-year smoking history Maintenance: Anoro Ellipta Pt of: Kevin Lambert  11/20/2020  - Visit   65 year old male current smoker presenting to office today as a 55-month follow-up after completing pulmonary function testing.  Patient continues to smoke 4 cigarettes a day.  He has not on a maintenance inhaler.  He was trialed at last office visit with Celedonio Miyamoto he did not notice any clinical benefit.  Results of the pulmonary function test are listed below:  11/18/2020-pulmonary function test- FVC 2.77 (73% addicted), postbronchodilator ratio 79, postbronchodilator FEV1 2.18 (75% predicted), positive bronchodilator response in FEV1, DLCO 13.88 (53% predicted)  At last office visit in July/2021 plan of care was as follows: Follow-up in 2 months with pulmonary function testing, work on stopping smoking, start nicotine replacement therapies, received Pneumovax 23, home sleep study was ordered.  Patient continues to be followed by Dr. Earlie Server with oncology last assessment and plan as listed below: 11/19/20 - OV - Oncology   ASSESSMENT AND PLAN: This is a very pleasant 65 years old African-American male recently diagnosed with a stage IV non-small cell lung cancer, adenocarcinoma with no actionable mutations presented with large right lower lobe lung mass in addition to mediastinal and right supraclavicular lymphadenopathy as well as bilateral pulmonary nodules diagnosed in February 2021. I explained to the patient that he has incurable condition and all the treatment  options will be of palliative nature. The patient has no actionable mutations on the recent molecular studies and he is not a candidate for the [facility] clinical trial. He started systemic chemotherapy with carboplatin, Alimta and Keytruda status post 12 cycles.  Starting from cycle #5 the patient is on maintenance treatment with Alimta and Keytruda every 3 weeks. The patient has been treated with single agent Keytruda the last few cycles because of the renal insufficiency. He had repeat CT scan of the chest, abdomen pelvis performed recently.  I personally and independently reviewed the scans and discussed the results with the patient today. His scan showed no concerning findings for disease progression. I recommended for the patient to continue his current treatment with immunotherapy but I will hold cycle #13 for few weeks until improvement of his renal insufficiency because of concern about immunotherapy mediated nephritis but this could be also secondary to his aggressive diuresis over the last few weeks for the swelling of the upper and lower extremities. I will start the patient on a tapered dose of prednisone over the next 3 weeks. For the hypertension he will continue to monitor his blood pressure closely and take his medication as prescribed. The patient was advised to call immediately if he has any other concerning symptoms in the interval. The patient voices understanding of current disease status and treatment options and is in agreement with the current care plan.    Questionaires / Pulmonary Flowsheets:   ACT:  No flowsheet data found.  MMRC: mMRC Dyspnea Scale mMRC Score  05/21/2020 3    Epworth:  No flowsheet data found.  Tests:   12/09/2019-PET scan-posterior  right upper lobe mass is FDG avid and concerning for primary bronchogenic carcinoma there is associated postobstructive pneumonitis within the posterior and lateral right upper lobe, tumor also appears to extend into  the superior segment of the right lower lobe, bilateral pulmonary nodules are also identified suspicious for missed metastatic disease, FDG avid right hilar, subcarinal, right paratracheal lymph nodes are identified compatible with metastatic adenopathy  03/02/2020-CT chest with contrast-response to therapy the right upper lobe lung mass and thoracic adenopathy since 2/1 4-20 21, resolution of pulmonary nodules consistent with response to therapy of pulmonary metastasis, no new or progressive disease, right adrenal adenoma, aortic arthrosclerosis, coronary artery arthrosclerosis and emphysema  FENO:  No results found for: NITRICOXIDE  PFT: PFT Results Latest Ref Rng & Units 11/18/2020  FVC-Pre L 2.77  FVC-Predicted Pre % 73  FVC-Post L 2.77  FVC-Predicted Post % 73  Pre FEV1/FVC % % 63  Post FEV1/FCV % % 79  FEV1-Pre L 1.75  FEV1-Predicted Pre % 60  FEV1-Post L 2.18  DLCO uncorrected ml/min/mmHg 13.88  DLCO UNC% % 53  DLCO corrected ml/min/mmHg 15.01  DLCO COR %Predicted % 57  DLVA Predicted % 88  TLC L 5.25  TLC % Predicted % 77  RV % Predicted % 96    WALK:  SIX MIN WALK 11/20/2020  Supplimental Oxygen during Test? (L/min) No  Tech Comments: Pt was able to complete 3 laps at fast pace without any stops. Pt had no c/o at end of walk    Imaging: CT Abdomen Pelvis Wo Contrast  Result Date: 11/16/2020 CLINICAL DATA:  Stage IV right upper lobe non-small cell lung cancer. Assess treatment response. EXAM: CT CHEST, ABDOMEN AND PELVIS WITHOUT CONTRAST TECHNIQUE: Multidetector CT imaging of the chest, abdomen and pelvis was performed following the standard protocol without IV contrast. COMPARISON:  09/11/2020 CT chest, abdomen and pelvis. FINDINGS: CT CHEST FINDINGS Cardiovascular: Normal heart size. No significant pericardial effusion/thickening. Left anterior descending and right coronary atherosclerosis. Right internal jugular Port-A-Cath terminates at the cavoatrial junction.  Atherosclerotic nonaneurysmal thoracic aorta. Dilated main pulmonary artery (3.5 cm diameter), stable. Mediastinum/Nodes: No discrete thyroid nodules. Unremarkable esophagus. No axillary adenopathy. Stable top-normal size 0.9 cm right paratracheal (series 2/image 19) and 0.9 cm subcarinal (series 2/image 26) nodes. No pathologically enlarged mediastinal nodes. No discrete hilar adenopathy on these noncontrast images. Lungs/Pleura: No pneumothorax. Resolved bilateral pleural effusions. Posterior right upper lobe 3.1 x 2.2 cm solid lung mass (series 6/image 50), previously 3.7 x 2.9 cm, decreased. No acute consolidative airspace disease or new significant pulmonary nodules. Musculoskeletal: No aggressive appearing focal osseous lesions. Marked thoracic spondylosis. CT ABDOMEN PELVIS FINDINGS Hepatobiliary: Normal liver with no liver mass. Normal gallbladder with no radiopaque cholelithiasis. No biliary ductal dilatation. Pancreas: Normal, with no mass or duct dilation. Spleen: Normal size. No mass. Adrenals/Urinary Tract: Right adrenal 2.4 cm nodule with density 6 HU, stable, compatible with an adenoma. Left adrenal 1.6 cm nodule with density 20 HU, stable, previously non hypermetabolic, compatible with an adenoma. Nonobstructing 5 mm lower left renal stone. No right renal stones. No hydronephrosis. No contour deforming renal masses. Normal bladder. Stomach/Bowel: Normal non-distended stomach. Normal caliber small bowel with no small bowel wall thickening. Normal appendix. Oral contrast transits to the large bowel. Normal large bowel with no diverticulosis, large bowel wall thickening or pericolonic fat stranding. Vascular/Lymphatic: Atherosclerotic nonaneurysmal abdominal aorta. No pathologically enlarged lymph nodes in the abdomen or pelvis. Reproductive: Top-normal size prostate. Other: No pneumoperitoneum, ascites or focal fluid collection.  Musculoskeletal: No aggressive appearing focal osseous lesions. Marked  lumbar spondylosis. IMPRESSION: 1. Decreased posterior right upper lobe lung mass. 2. No evidence of metastatic disease in the chest, abdomen or pelvis. 3. Resolved bilateral pleural effusions. 4. Stable bilateral adrenal adenomas. 5. Aortic Atherosclerosis (ICD10-I70.0). Electronically Signed   By: Ilona Sorrel M.D.   On: 11/16/2020 11:52   CT Chest Wo Contrast  Result Date: 11/16/2020 CLINICAL DATA:  Stage IV right upper lobe non-small cell lung cancer. Assess treatment response. EXAM: CT CHEST, ABDOMEN AND PELVIS WITHOUT CONTRAST TECHNIQUE: Multidetector CT imaging of the chest, abdomen and pelvis was performed following the standard protocol without IV contrast. COMPARISON:  09/11/2020 CT chest, abdomen and pelvis. FINDINGS: CT CHEST FINDINGS Cardiovascular: Normal heart size. No significant pericardial effusion/thickening. Left anterior descending and right coronary atherosclerosis. Right internal jugular Port-A-Cath terminates at the cavoatrial junction. Atherosclerotic nonaneurysmal thoracic aorta. Dilated main pulmonary artery (3.5 cm diameter), stable. Mediastinum/Nodes: No discrete thyroid nodules. Unremarkable esophagus. No axillary adenopathy. Stable top-normal size 0.9 cm right paratracheal (series 2/image 19) and 0.9 cm subcarinal (series 2/image 26) nodes. No pathologically enlarged mediastinal nodes. No discrete hilar adenopathy on these noncontrast images. Lungs/Pleura: No pneumothorax. Resolved bilateral pleural effusions. Posterior right upper lobe 3.1 x 2.2 cm solid lung mass (series 6/image 50), previously 3.7 x 2.9 cm, decreased. No acute consolidative airspace disease or new significant pulmonary nodules. Musculoskeletal: No aggressive appearing focal osseous lesions. Marked thoracic spondylosis. CT ABDOMEN PELVIS FINDINGS Hepatobiliary: Normal liver with no liver mass. Normal gallbladder with no radiopaque cholelithiasis. No biliary ductal dilatation. Pancreas: Normal, with no mass or  duct dilation. Spleen: Normal size. No mass. Adrenals/Urinary Tract: Right adrenal 2.4 cm nodule with density 6 HU, stable, compatible with an adenoma. Left adrenal 1.6 cm nodule with density 20 HU, stable, previously non hypermetabolic, compatible with an adenoma. Nonobstructing 5 mm lower left renal stone. No right renal stones. No hydronephrosis. No contour deforming renal masses. Normal bladder. Stomach/Bowel: Normal non-distended stomach. Normal caliber small bowel with no small bowel wall thickening. Normal appendix. Oral contrast transits to the large bowel. Normal large bowel with no diverticulosis, large bowel wall thickening or pericolonic fat stranding. Vascular/Lymphatic: Atherosclerotic nonaneurysmal abdominal aorta. No pathologically enlarged lymph nodes in the abdomen or pelvis. Reproductive: Top-normal size prostate. Other: No pneumoperitoneum, ascites or focal fluid collection. Musculoskeletal: No aggressive appearing focal osseous lesions. Marked lumbar spondylosis. IMPRESSION: 1. Decreased posterior right upper lobe lung mass. 2. No evidence of metastatic disease in the chest, abdomen or pelvis. 3. Resolved bilateral pleural effusions. 4. Stable bilateral adrenal adenomas. 5. Aortic Atherosclerosis (ICD10-I70.0). Electronically Signed   By: Ilona Sorrel M.D.   On: 11/16/2020 11:52    Lab Results:  CBC    Component Value Date/Time   WBC 6.3 11/19/2020 1150   WBC 5.8 01/18/2020 1443   RBC 3.58 (L) 11/19/2020 1150   HGB 11.7 (L) 11/19/2020 1150   HCT 34.3 (L) 11/19/2020 1150   PLT 225 11/19/2020 1150   MCV 95.8 11/19/2020 1150   MCH 32.7 11/19/2020 1150   MCHC 34.1 11/19/2020 1150   RDW 13.1 11/19/2020 1150   LYMPHSABS 2.4 11/19/2020 1150   MONOABS 0.7 11/19/2020 1150   EOSABS 0.3 11/19/2020 1150   BASOSABS 0.0 11/19/2020 1150    BMET    Component Value Date/Time   NA 139 11/19/2020 1150   K 4.0 11/19/2020 1150   CL 104 11/19/2020 1150   CO2 24 11/19/2020 1150   GLUCOSE  95 11/19/2020 1150  BUN 30 (H) 11/19/2020 1150   CREATININE 2.45 (H) 11/19/2020 1150   CALCIUM 10.1 11/19/2020 1150   GFRNONAA 28 (L) 11/19/2020 1150   GFRAA 58 (L) 08/10/2020 1012    BNP    Component Value Date/Time   BNP 47.4 09/24/2020 1429    ProBNP No results found for: PROBNP  Specialty Problems      Pulmonary Problems   Non-small cell cancer of right lung (HCC)   DOE (dyspnea on exertion)      Allergies  Allergen Reactions  . Metformin And Related Other (See Comments)    Severe muscle spasms  . Naproxen     Pt states it have him off balance.    Immunization History  Administered Date(s) Administered  . Pneumococcal-Unspecified 05/21/2020    Past Medical History:  Diagnosis Date  . Arthritis   . Chronic kidney disease   . Depression   . DM (diabetes mellitus) ([place])    type II  . Dyslipidemia   . Dyspnea    unable to walk much - he thinks it is from   . GERD (gastroesophageal reflux disease)   . Hepatitis    Hepatitis C- treated  . History of kidney stones   . HTN (hypertension)   . Neuropathy   . nscl ca dx'd 10/2019  . OSA on CPAP   . PTSD (post-traumatic stress disorder)     Tobacco History: Social History   Tobacco Use  Smoking Status Current Every Day Smoker  . Packs/day: 0.60  . Years: 50.00  . Pack years: 30.00  . Types: Cigarettes  Smokeless Tobacco Never Used  Tobacco Comment   4 cigarettes smoked daily ARJ 11/20/20   Ready to quit: Not Answered Counseling given: Not Answered Comment: 4 cigarettes smoked daily ARJ 11/20/20   Continue to not smoke  Outpatient Encounter Medications as of 11/20/2020  Medication Sig  . albuterol (VENTOLIN HFA) 108 (90 Base) MCG/ACT inhaler Inhale 2 puffs into the lungs every 4 (four) hours as needed for wheezing or shortness of breath.  Marland Kitchen amLODipine (NORVASC) 10 MG tablet Take 10 mg by mouth daily.  . Ascorbic Acid (VITAMIN C) 1000 MG tablet Take 1,000 mg by mouth daily.  Marland Kitchen aspirin EC 81 MG  tablet Take 81 mg by mouth daily.  Marland Kitchen atorvastatin (LIPITOR) 10 MG tablet Take 10 mg by mouth daily.  . bisacodyl (DULCOLAX) 5 MG EC tablet Take 5 mg by mouth daily as needed for moderate constipation.  . cholecalciferol (VITAMIN D3) 25 MCG (1000 UNIT) tablet Take 1,000 Units by mouth daily.  . Cyanocobalamin (VITAMIN B 12 PO) Take 1 tablet by mouth daily.  . folic acid (FOLVITE) 1 MG tablet Take 1 tablet (1 mg total) by mouth daily. Pt needs to pick up today and start ASAP . He will pay out of pocket. He is waiting on the [place] to send his folic acid which will be next week. (Patient taking differently: Take 1 mg by mouth daily.)  . furosemide (LASIX) 20 MG tablet Take 1 tablet (20 mg total) by mouth 2 (two) times daily. (Patient taking differently: Take 20 mg by mouth 2 (two) times daily. Pt takes once a week)  . glipiZIDE (GLUCOTROL) 5 MG tablet Take 5 mg by mouth daily before breakfast.   . lidocaine-prilocaine (EMLA) cream Apply to Port-A-Cath site 30-60-minute before treatment.  . magic mouthwash SOLN Take 5 mLs by mouth 4 (four) times daily as needed for mouth pain. Swish and swallow or spit  .  potassium chloride SA (KLOR-CON) 20 MEQ tablet Take 1 tablet (20 mEq total) by mouth daily.  . predniSONE (DELTASONE) 20 MG tablet 5 tablet p.o. daily for 5 days, then 4 tablet p.o. daily for 5 days, then 3 tablet p.o. daily for 5 days, then 2 tablet p.o. daily for 5 days and then 1 tablet p.o. daily for 5 days.  . pregabalin (LYRICA) 25 MG capsule Take 1 capsule (25 mg total) by mouth daily.  Marland Kitchen Propylene Glycol (SYSTANE BALANCE OP) Place 1 drop into both eyes daily.  Marland Kitchen triamterene-hydrochlorothiazide (MAXZIDE-25) 37.5-25 MG tablet Take 1 tablet by mouth daily.  Marland Kitchen zinc gluconate 50 MG tablet Take 50 mg by mouth daily.  . nicotine (NICODERM CQ) 21 mg/24hr patch Place 1 patch (21 mg total) onto the skin daily. (Patient not taking: Reported on 11/20/2020)  . nicotine polacrilex (COMMIT) 4 MG lozenge Take 1  lozenge (4 mg total) by mouth as needed for smoking cessation. (Patient not taking: Reported on 11/20/2020)  . umeclidinium-vilanterol (ANORO ELLIPTA) 62.5-25 MCG/INH AEPB Inhale 1 puff into the lungs daily. (Patient not taking: Reported on 11/20/2020)  . umeclidinium-vilanterol (ANORO ELLIPTA) 62.5-25 MCG/INH AEPB Inhale 1 puff into the lungs daily. (Patient not taking: Reported on 11/20/2020)   No facility-administered encounter medications on file as of 11/20/2020.     Review of Systems  Review of Systems  Constitutional: Negative for activity change, chills, fatigue, fever and unexpected weight change.  HENT: Negative for congestion, postnasal drip, rhinorrhea, sinus pressure, sinus pain and sore throat.   Eyes: Negative.   Respiratory: Positive for cough (dry ). Negative for shortness of breath and wheezing.   Cardiovascular: Negative for chest pain and palpitations.  Gastrointestinal: Negative for constipation, diarrhea, nausea and vomiting.  Endocrine: Negative.   Genitourinary: Negative.   Musculoskeletal: Negative.   Skin: Negative.   Neurological: Negative for dizziness and headaches.  Psychiatric/Behavioral: Negative.  Negative for dysphoric mood. The patient is not nervous/anxious.   All other systems reviewed and are negative.    Physical Exam  BP 136/86 (BP Location: Left Arm, Cuff Size: Normal)   Pulse 92   Temp 99.4 F (37.4 C)   Ht 5\' 9"  (1.753 m)   Wt 222 lb 6.4 oz (100.9 kg)   SpO2 97%   BMI 32.84 kg/m   Wt Readings from Last 5 Encounters:  11/20/20 222 lb 6.4 oz (100.9 kg)  11/19/20 222 lb 4.8 oz (100.8 kg)  10/28/20 220 lb 9.6 oz (100.1 kg)  10/08/20 229 lb 12.8 oz (104.2 kg)  09/24/20 237 lb 3.2 oz (107.6 kg)    BMI Readings from Last 5 Encounters:  11/20/20 32.84 kg/m  11/19/20 33.80 kg/m  10/28/20 33.54 kg/m  10/08/20 34.94 kg/m  09/24/20 36.07 kg/m     Physical Exam Vitals and nursing note reviewed.  Constitutional:      General: He  is not in acute distress.    Appearance: Normal appearance. He is obese.  HENT:     Head: Normocephalic and atraumatic.     Right Ear: Hearing and external ear normal.     Left Ear: Hearing and external ear normal.     Nose: No mucosal edema.     Right Turbinates: Not enlarged.     Left Turbinates: Not enlarged.     Mouth/Throat:     Mouth: Mucous membranes are dry.     Pharynx: Oropharynx is clear. No oropharyngeal exudate.  Eyes:     Pupils: Pupils are equal, round,  and reactive to light.  Cardiovascular:     Rate and Rhythm: Normal rate and regular rhythm.     Pulses: Normal pulses.     Heart sounds: Normal heart sounds. No murmur heard.   Pulmonary:     Effort: Pulmonary effort is normal.     Breath sounds: Normal breath sounds. No decreased breath sounds, wheezing or rales.  Musculoskeletal:     Cervical back: Normal range of motion.     Right lower leg: No edema.     Left lower leg: No edema.  Lymphadenopathy:     Cervical: No cervical adenopathy.  Skin:    General: Skin is warm and dry.     Capillary Refill: Capillary refill takes less than 2 seconds.     Findings: No erythema or rash.  Neurological:     General: No focal deficit present.     Mental Status: He is alert and oriented to person, place, and time.     Motor: No weakness.     Coordination: Coordination normal.     Gait: Gait is intact. Gait normal.  Psychiatric:        Mood and Affect: Mood normal.        Behavior: Behavior normal. Behavior is cooperative.        Thought Content: Thought content normal.        Judgment: Judgment normal.       Assessment & Plan:   Non-small cell cancer of right lung (HCC) Plan: Continue follow-up with oncology Continue CT imaging follow-up as recommended by oncology  At risk for obstructive sleep apnea Plan: Will recommend home sleep study  DOE (dyspnea on exertion) Reviewed pulmonary function testing today Patient did not notice clinical benefit with  Anoro Ellipta trial  Plan: We will continue clinically monitor Emphasized the patient that it is important that he work to stop smoking  Healthcare maintenance Plan: Recommend patient obtain COVID-19 vaccinations Recommend seasonal flu vaccine  Tobacco abuse Active smoker Smoking 4 cigarettes a day 30-pack-year smoking history  Plan: Strongly recommend the patient stop smoking Recommend nicotine replacement therapies as previously discussed    Return in about 6 months (around 05/20/2021), or if symptoms worsen or fail to improve, for Follow up with Kevin Lambert.   Lauraine Rinne, NP 11/20/2020   This appointment required 35 minutes of patient care (this includes precharting, chart review, review of results, face-to-face care, etc.).

## 2020-11-20 NOTE — Assessment & Plan Note (Signed)
Plan: Recommend patient obtain COVID-19 vaccinations Recommend seasonal flu vaccine

## 2020-11-21 NOTE — Progress Notes (Signed)
Thanks for seeing him  Garner Nash, DO [facility] 11/21/2020 5:27 PM

## 2020-11-23 ENCOUNTER — Ambulatory Visit: Payer: No Typology Code available for payment source | Admitting: Physician Assistant

## 2020-11-27 ENCOUNTER — Telehealth: Payer: Self-pay | Admitting: Pulmonary Disease

## 2020-11-27 NOTE — Telephone Encounter (Signed)
OV note from 11/20/20 has been faxed to [facility] at the fax number given.

## 2020-12-10 ENCOUNTER — Inpatient Hospital Stay: Payer: No Typology Code available for payment source

## 2020-12-10 ENCOUNTER — Encounter: Payer: Self-pay | Admitting: Internal Medicine

## 2020-12-10 ENCOUNTER — Inpatient Hospital Stay (HOSPITAL_BASED_OUTPATIENT_CLINIC_OR_DEPARTMENT_OTHER): Payer: No Typology Code available for payment source | Admitting: Internal Medicine

## 2020-12-10 ENCOUNTER — Other Ambulatory Visit: Payer: Self-pay

## 2020-12-10 ENCOUNTER — Inpatient Hospital Stay: Payer: No Typology Code available for payment source | Attending: Internal Medicine

## 2020-12-10 VITALS — BP 168/72 | HR 82 | Temp 99.0°F | Resp 18 | Ht 69.0 in | Wt 225.0 lb

## 2020-12-10 DIAGNOSIS — C3411 Malignant neoplasm of upper lobe, right bronchus or lung: Secondary | ICD-10-CM | POA: Insufficient documentation

## 2020-12-10 DIAGNOSIS — C3491 Malignant neoplasm of unspecified part of right bronchus or lung: Secondary | ICD-10-CM

## 2020-12-10 DIAGNOSIS — I10 Essential (primary) hypertension: Secondary | ICD-10-CM | POA: Diagnosis not present

## 2020-12-10 DIAGNOSIS — Z79899 Other long term (current) drug therapy: Secondary | ICD-10-CM | POA: Insufficient documentation

## 2020-12-10 DIAGNOSIS — Z5112 Encounter for antineoplastic immunotherapy: Secondary | ICD-10-CM | POA: Insufficient documentation

## 2020-12-10 DIAGNOSIS — N189 Chronic kidney disease, unspecified: Secondary | ICD-10-CM | POA: Diagnosis not present

## 2020-12-10 DIAGNOSIS — Z95828 Presence of other vascular implants and grafts: Secondary | ICD-10-CM

## 2020-12-10 LAB — CBC WITH DIFFERENTIAL (CANCER CENTER ONLY)
Abs Immature Granulocytes: 0.1 10*3/uL — ABNORMAL HIGH (ref 0.00–0.07)
Basophils Absolute: 0 10*3/uL (ref 0.0–0.1)
Basophils Relative: 0 %
Eosinophils Absolute: 0 10*3/uL (ref 0.0–0.5)
Eosinophils Relative: 0 %
HCT: 32.9 % — ABNORMAL LOW (ref 39.0–52.0)
Hemoglobin: 11.6 g/dL — ABNORMAL LOW (ref 13.0–17.0)
Immature Granulocytes: 1 %
Lymphocytes Relative: 10 %
Lymphs Abs: 1 10*3/uL (ref 0.7–4.0)
MCH: 32.3 pg (ref 26.0–34.0)
MCHC: 35.3 g/dL (ref 30.0–36.0)
MCV: 91.6 fL (ref 80.0–100.0)
Monocytes Absolute: 0.2 10*3/uL (ref 0.1–1.0)
Monocytes Relative: 2 %
Neutro Abs: 8.7 10*3/uL — ABNORMAL HIGH (ref 1.7–7.7)
Neutrophils Relative %: 87 %
Platelet Count: 168 10*3/uL (ref 150–400)
RBC: 3.59 MIL/uL — ABNORMAL LOW (ref 4.22–5.81)
RDW: 14 % (ref 11.5–15.5)
WBC Count: 10.1 10*3/uL (ref 4.0–10.5)
nRBC: 0 % (ref 0.0–0.2)

## 2020-12-10 LAB — TSH: TSH: 0.28 u[IU]/mL — ABNORMAL LOW (ref 0.320–4.118)

## 2020-12-10 LAB — CMP (CANCER CENTER ONLY)
ALT: 30 U/L (ref 0–44)
AST: 12 U/L — ABNORMAL LOW (ref 15–41)
Albumin: 3.6 g/dL (ref 3.5–5.0)
Alkaline Phosphatase: 52 U/L (ref 38–126)
Anion gap: 7 (ref 5–15)
BUN: 34 mg/dL — ABNORMAL HIGH (ref 8–23)
CO2: 24 mmol/L (ref 22–32)
Calcium: 9.8 mg/dL (ref 8.9–10.3)
Chloride: 100 mmol/L (ref 98–111)
Creatinine: 1.9 mg/dL — ABNORMAL HIGH (ref 0.61–1.24)
GFR, Estimated: 39 mL/min — ABNORMAL LOW (ref >60)
Glucose, Bld: 218 mg/dL — ABNORMAL HIGH (ref 70–99)
Potassium: 4.7 mmol/L (ref 3.5–5.1)
Sodium: 131 mmol/L — ABNORMAL LOW (ref 135–145)
Total Bilirubin: 0.3 mg/dL (ref 0.3–1.2)
Total Protein: 6.9 g/dL (ref 6.5–8.1)

## 2020-12-10 MED ORDER — SODIUM CHLORIDE 0.9% FLUSH
10.0000 mL | Freq: Once | INTRAVENOUS | Status: AC
  Administered 2020-12-10: 10 mL
  Filled 2020-12-10: qty 10

## 2020-12-10 MED ORDER — PROCHLORPERAZINE MALEATE 10 MG PO TABS
10.0000 mg | ORAL_TABLET | Freq: Once | ORAL | Status: AC
  Administered 2020-12-10: 10 mg via ORAL

## 2020-12-10 MED ORDER — PROCHLORPERAZINE MALEATE 10 MG PO TABS
ORAL_TABLET | ORAL | Status: AC
  Filled 2020-12-10: qty 1

## 2020-12-10 MED ORDER — HEPARIN SOD (PORK) LOCK FLUSH 100 UNIT/ML IV SOLN
500.0000 [IU] | Freq: Once | INTRAVENOUS | Status: AC | PRN
  Administered 2020-12-10: 500 [IU]
  Filled 2020-12-10: qty 5

## 2020-12-10 MED ORDER — SODIUM CHLORIDE 0.9 % IV SOLN
Freq: Once | INTRAVENOUS | Status: AC
  Filled 2020-12-10: qty 250

## 2020-12-10 MED ORDER — SODIUM CHLORIDE 0.9 % IV SOLN
200.0000 mg | Freq: Once | INTRAVENOUS | Status: AC
  Administered 2020-12-10: 200 mg via INTRAVENOUS
  Filled 2020-12-10: qty 8

## 2020-12-10 MED ORDER — SODIUM CHLORIDE 0.9% FLUSH
10.0000 mL | INTRAVENOUS | Status: DC | PRN
  Administered 2020-12-10: 10 mL
  Filled 2020-12-10: qty 10

## 2020-12-10 NOTE — Progress Notes (Signed)
[facility] Telephone:([phone]) [phone]   Fax:(336) [phone]  OFFICE PROGRESS NOTE  [name], MD [address] [place]  [place] [address]  DIAGNOSIS: Stage IV (T2b, N3, M1a) non-small cell lung cancer, adenocarcinoma presented with large right upper lobe lung mass in addition to mediastinal and right supraclavicular lymphadenopathy as well as bilateral pulmonary nodules diagnosed in February 2021.  MOLECULAR STUDY by [employer]:  KRASG12C, 4.3%, Binimetinib  [id number], 0.9%, Niraparib, Olaparib, Rucaparib,Talazoparib, Tazemetostat  [id number], 1.7%, None   PRIOR THERAPY: None  CURRENT THERAPY: Systemic chemotherapy with carboplatin for AUC of 5, Alimta 500 mg/M2 and Keytruda 200 mg IV every 3 weeks.  First dose December 31, 2019.  Status post 15 cycles.  The last few cycles he has been treated with single agent Keytruda secondary to renal insufficiency.  INTERVAL HISTORY: Kevin Lambert. 65 y.o. male returns to the clinic today for follow-up visit.  The patient is feeling fine today with no concerning complaints except for occasional hot flashes.  His blood pressure is also high today and he takes his blood pressure medication as prescribed and he is scheduled to see his primary care physician next months.  The patient denied having any chest pain, shortness of breath, cough or hemoptysis.  He denied having any fever or chills.  He has no nausea, vomiting, diarrhea or constipation.  He has no headache or visual changes.  He is here today for evaluation before starting cycle #16 of his treatment with single agent Keytruda.  MEDICAL HISTORY: Past Medical History:  Diagnosis Date  . Arthritis   . Chronic kidney disease   . Depression   . DM (diabetes mellitus) ([place])    type II  . Dyslipidemia   . Dyspnea    unable to walk much - he thinks it is from   . GERD (gastroesophageal reflux disease)   . Hepatitis    Hepatitis C- treated  . History of kidney  stones   . HTN (hypertension)   . Neuropathy   . nscl ca dx'd 10/2019  . OSA on CPAP   . PTSD (post-traumatic stress disorder)     ALLERGIES:  is allergic to metformin and related and naproxen.  MEDICATIONS:  Current Outpatient Medications  Medication Sig Dispense Refill  . albuterol (VENTOLIN HFA) 108 (90 Base) MCG/ACT inhaler Inhale 2 puffs into the lungs every 4 (four) hours as needed for wheezing or shortness of breath. 18 g 0  . amLODipine (NORVASC) 10 MG tablet Take 10 mg by mouth daily.    . Ascorbic Acid (VITAMIN C) 1000 MG tablet Take 1,000 mg by mouth daily.    Marland Kitchen aspirin EC 81 MG tablet Take 81 mg by mouth daily.    Marland Kitchen atorvastatin (LIPITOR) 10 MG tablet Take 10 mg by mouth daily.    . bisacodyl (DULCOLAX) 5 MG EC tablet Take 5 mg by mouth daily as needed for moderate constipation.    . cholecalciferol (VITAMIN D3) 25 MCG (1000 UNIT) tablet Take 1,000 Units by mouth daily.    . Cyanocobalamin (VITAMIN B 12 PO) Take 1 tablet by mouth daily.    . folic acid (FOLVITE) 1 MG tablet Take 1 tablet (1 mg total) by mouth daily. Pt needs to pick up today and start ASAP . He will pay out of pocket. He is waiting on the [place] to send his folic acid which will be next week. (Patient taking differently: Take 1 mg by mouth daily.) 7 tablet  0  . furosemide (LASIX) 20 MG tablet Take 1 tablet (20 mg total) by mouth 2 (two) times daily. (Patient taking differently: Take 20 mg by mouth 2 (two) times daily. Pt takes once a week) 20 tablet 0  . glipiZIDE (GLUCOTROL) 5 MG tablet Take 5 mg by mouth daily before breakfast.     . lidocaine-prilocaine (EMLA) cream Apply to Port-A-Cath site 30-60-minute before treatment. 30 g 0  . magic mouthwash SOLN Take 5 mLs by mouth 4 (four) times daily as needed for mouth pain. Swish and swallow or spit 240 mL 2  . nicotine (NICODERM CQ) 21 mg/24hr patch Place 1 patch (21 mg total) onto the skin daily. (Patient not taking: Reported on 11/20/2020) 28 patch 3  . nicotine  polacrilex (COMMIT) 4 MG lozenge Take 1 lozenge (4 mg total) by mouth as needed for smoking cessation. (Patient not taking: Reported on 11/20/2020) 108 tablet 3  . potassium chloride SA (KLOR-CON) 20 MEQ tablet Take 1 tablet (20 mEq total) by mouth daily. 10 tablet 0  . predniSONE (DELTASONE) 20 MG tablet 5 tablet p.o. daily for 5 days, then 4 tablet p.o. daily for 5 days, then 3 tablet p.o. daily for 5 days, then 2 tablet p.o. daily for 5 days and then 1 tablet p.o. daily for 5 days. 75 tablet 0  . pregabalin (LYRICA) 25 MG capsule Take 1 capsule (25 mg total) by mouth daily. 30 capsule 1  . Propylene Glycol (SYSTANE BALANCE OP) Place 1 drop into both eyes daily.    Marland Kitchen triamterene-hydrochlorothiazide (MAXZIDE-25) 37.5-25 MG tablet Take 1 tablet by mouth daily.    Marland Kitchen umeclidinium-vilanterol (ANORO ELLIPTA) 62.5-25 MCG/INH AEPB Inhale 1 puff into the lungs daily. (Patient not taking: Reported on 11/20/2020) 60 each 11  . umeclidinium-vilanterol (ANORO ELLIPTA) 62.5-25 MCG/INH AEPB Inhale 1 puff into the lungs daily. (Patient not taking: Reported on 11/20/2020) 14 each 0  . zinc gluconate 50 MG tablet Take 50 mg by mouth daily.     No current facility-administered medications for this visit.    SURGICAL HISTORY:  Past Surgical History:  Procedure Laterality Date  . BIOPSY OF MEDIASTINAL MASS  12/10/2019   Procedure: BIOPSY OF MEDIASTINAL MASS;  Surgeon: Garner Nash, DO;  Location: [facility];  Service: Pulmonary;;  right upper lobe  . BRONCHIAL NEEDLE ASPIRATION BIOPSY  12/10/2019   Procedure: BRONCHIAL NEEDLE ASPIRATION BIOPSIES;  Surgeon: Garner Nash, DO;  Location: [facility];  Service: Pulmonary;;  . COLONOSCOPY    . IR IMAGING GUIDED PORT INSERTION  12/31/2019  . kidney stone    . URETERAL STENT PLACEMENT    . Ureteral Stent Removed    . VIDEO BRONCHOSCOPY WITH ENDOBRONCHIAL ULTRASOUND N/A 12/10/2019   Procedure: VIDEO BRONCHOSCOPY WITH ENDOBRONCHIAL ULTRASOUND;  Surgeon: Garner Nash, DO;  Location: [facility];  Service: Pulmonary;  Laterality: N/A;    REVIEW OF SYSTEMS:  A comprehensive review of systems was negative except for: Constitutional: positive for fatigue Endocrine: positive for Hot flashes   PHYSICAL EXAMINATION: General appearance: alert, cooperative, fatigued and no distress Head: Normocephalic, without obvious abnormality, atraumatic Neck: no adenopathy, no JVD, supple, symmetrical, trachea midline and thyroid not enlarged, symmetric, no tenderness/mass/nodules Lymph nodes: Cervical, supraclavicular, and axillary nodes normal. Resp: clear to auscultation bilaterally Back: symmetric, no curvature. ROM normal. No CVA tenderness. Cardio: regular rate and rhythm, S1, S2 normal, no murmur, click, rub or gallop GI: soft, non-tender; bowel sounds normal; no masses,  no organomegaly Extremities: extremities  normal, atraumatic, no cyanosis or edema  ECOG PERFORMANCE STATUS: 1 - Symptomatic but completely ambulatory  Blood pressure (!) 168/72, pulse 82, temperature 99 F (37.2 C), temperature source Tympanic, resp. rate 18, height 5\' 9"  (1.753 m), weight 225 lb (102.1 kg), SpO2 99 %.  LABORATORY DATA: Lab Results  Component Value Date   WBC 10.1 12/10/2020   HGB 11.6 (L) 12/10/2020   HCT 32.9 (L) 12/10/2020   MCV 91.6 12/10/2020   PLT 168 12/10/2020      Chemistry      Component Value Date/Time   NA 139 11/19/2020 1150   K 4.0 11/19/2020 1150   CL 104 11/19/2020 1150   CO2 24 11/19/2020 1150   BUN 30 (H) 11/19/2020 1150   CREATININE 2.45 (H) 11/19/2020 1150      Component Value Date/Time   CALCIUM 10.1 11/19/2020 1150   ALKPHOS 86 11/19/2020 1150   AST 24 11/19/2020 1150   ALT 42 11/19/2020 1150   BILITOT 0.3 11/19/2020 1150       RADIOGRAPHIC STUDIES: CT Abdomen Pelvis Wo Contrast  Result Date: 11/16/2020 CLINICAL DATA:  Stage IV right upper lobe non-small cell lung cancer. Assess treatment response. EXAM: CT CHEST, ABDOMEN  AND PELVIS WITHOUT CONTRAST TECHNIQUE: Multidetector CT imaging of the chest, abdomen and pelvis was performed following the standard protocol without IV contrast. COMPARISON:  09/11/2020 CT chest, abdomen and pelvis. FINDINGS: CT CHEST FINDINGS Cardiovascular: Normal heart size. No significant pericardial effusion/thickening. Left anterior descending and right coronary atherosclerosis. Right internal jugular Port-A-Cath terminates at the cavoatrial junction. Atherosclerotic nonaneurysmal thoracic aorta. Dilated main pulmonary artery (3.5 cm diameter), stable. Mediastinum/Nodes: No discrete thyroid nodules. Unremarkable esophagus. No axillary adenopathy. Stable top-normal size 0.9 cm right paratracheal (series 2/image 19) and 0.9 cm subcarinal (series 2/image 26) nodes. No pathologically enlarged mediastinal nodes. No discrete hilar adenopathy on these noncontrast images. Lungs/Pleura: No pneumothorax. Resolved bilateral pleural effusions. Posterior right upper lobe 3.1 x 2.2 cm solid lung mass (series 6/image 50), previously 3.7 x 2.9 cm, decreased. No acute consolidative airspace disease or new significant pulmonary nodules. Musculoskeletal: No aggressive appearing focal osseous lesions. Marked thoracic spondylosis. CT ABDOMEN PELVIS FINDINGS Hepatobiliary: Normal liver with no liver mass. Normal gallbladder with no radiopaque cholelithiasis. No biliary ductal dilatation. Pancreas: Normal, with no mass or duct dilation. Spleen: Normal size. No mass. Adrenals/Urinary Tract: Right adrenal 2.4 cm nodule with density 6 HU, stable, compatible with an adenoma. Left adrenal 1.6 cm nodule with density 20 HU, stable, previously non hypermetabolic, compatible with an adenoma. Nonobstructing 5 mm lower left renal stone. No right renal stones. No hydronephrosis. No contour deforming renal masses. Normal bladder. Stomach/Bowel: Normal non-distended stomach. Normal caliber small bowel with no small bowel wall thickening. Normal  appendix. Oral contrast transits to the large bowel. Normal large bowel with no diverticulosis, large bowel wall thickening or pericolonic fat stranding. Vascular/Lymphatic: Atherosclerotic nonaneurysmal abdominal aorta. No pathologically enlarged lymph nodes in the abdomen or pelvis. Reproductive: Top-normal size prostate. Other: No pneumoperitoneum, ascites or focal fluid collection. Musculoskeletal: No aggressive appearing focal osseous lesions. Marked lumbar spondylosis. IMPRESSION: 1. Decreased posterior right upper lobe lung mass. 2. No evidence of metastatic disease in the chest, abdomen or pelvis. 3. Resolved bilateral pleural effusions. 4. Stable bilateral adrenal adenomas. 5. Aortic Atherosclerosis (ICD10-I70.0). Electronically Signed   By: Ilona Sorrel M.D.   On: 11/16/2020 11:52   CT Chest Wo Contrast  Result Date: 11/16/2020 CLINICAL DATA:  Stage IV right upper lobe non-small cell  lung cancer. Assess treatment response. EXAM: CT CHEST, ABDOMEN AND PELVIS WITHOUT CONTRAST TECHNIQUE: Multidetector CT imaging of the chest, abdomen and pelvis was performed following the standard protocol without IV contrast. COMPARISON:  09/11/2020 CT chest, abdomen and pelvis. FINDINGS: CT CHEST FINDINGS Cardiovascular: Normal heart size. No significant pericardial effusion/thickening. Left anterior descending and right coronary atherosclerosis. Right internal jugular Port-A-Cath terminates at the cavoatrial junction. Atherosclerotic nonaneurysmal thoracic aorta. Dilated main pulmonary artery (3.5 cm diameter), stable. Mediastinum/Nodes: No discrete thyroid nodules. Unremarkable esophagus. No axillary adenopathy. Stable top-normal size 0.9 cm right paratracheal (series 2/image 19) and 0.9 cm subcarinal (series 2/image 26) nodes. No pathologically enlarged mediastinal nodes. No discrete hilar adenopathy on these noncontrast images. Lungs/Pleura: No pneumothorax. Resolved bilateral pleural effusions. Posterior right  upper lobe 3.1 x 2.2 cm solid lung mass (series 6/image 50), previously 3.7 x 2.9 cm, decreased. No acute consolidative airspace disease or new significant pulmonary nodules. Musculoskeletal: No aggressive appearing focal osseous lesions. Marked thoracic spondylosis. CT ABDOMEN PELVIS FINDINGS Hepatobiliary: Normal liver with no liver mass. Normal gallbladder with no radiopaque cholelithiasis. No biliary ductal dilatation. Pancreas: Normal, with no mass or duct dilation. Spleen: Normal size. No mass. Adrenals/Urinary Tract: Right adrenal 2.4 cm nodule with density 6 HU, stable, compatible with an adenoma. Left adrenal 1.6 cm nodule with density 20 HU, stable, previously non hypermetabolic, compatible with an adenoma. Nonobstructing 5 mm lower left renal stone. No right renal stones. No hydronephrosis. No contour deforming renal masses. Normal bladder. Stomach/Bowel: Normal non-distended stomach. Normal caliber small bowel with no small bowel wall thickening. Normal appendix. Oral contrast transits to the large bowel. Normal large bowel with no diverticulosis, large bowel wall thickening or pericolonic fat stranding. Vascular/Lymphatic: Atherosclerotic nonaneurysmal abdominal aorta. No pathologically enlarged lymph nodes in the abdomen or pelvis. Reproductive: Top-normal size prostate. Other: No pneumoperitoneum, ascites or focal fluid collection. Musculoskeletal: No aggressive appearing focal osseous lesions. Marked lumbar spondylosis. IMPRESSION: 1. Decreased posterior right upper lobe lung mass. 2. No evidence of metastatic disease in the chest, abdomen or pelvis. 3. Resolved bilateral pleural effusions. 4. Stable bilateral adrenal adenomas. 5. Aortic Atherosclerosis (ICD10-I70.0). Electronically Signed   By: Ilona Sorrel M.D.   On: 11/16/2020 11:52    ASSESSMENT AND PLAN: This is a very pleasant 65 years old African-American male recently diagnosed with a stage IV non-small cell lung cancer, adenocarcinoma  with no actionable mutations presented with large right lower lobe lung mass in addition to mediastinal and right supraclavicular lymphadenopathy as well as bilateral pulmonary nodules diagnosed in February 2021. I explained to the patient that he has incurable condition and all the treatment options will be of palliative nature. The patient has no actionable mutations on the recent molecular studies and he is not a candidate for the [facility] clinical trial. He started systemic chemotherapy with carboplatin, Alimta and Keytruda status post 15 cycles.  Starting from cycle #5 the patient is on maintenance treatment with Alimta and Keytruda every 3 weeks. The patient has been treated with single agent Keytruda the last few cycles because of the renal insufficiency. The patient continues to tolerate his treatment with single agent Keytruda fairly well. I recommended for him to proceed with cycle #16 today as planned. I will see him back for follow-up visit in 3 weeks for evaluation before the next cycle of his treatment. For the hypertension he was advised to continue with his current blood pressure medication and to consult with his primary care physician for adjustment of his medication.  For the renal insufficiency, his serum creatinine is better today.  We will continue to monitor closely. The patient was advised to call immediately if he has any concerning symptoms in the interval. The patient voices understanding of current disease status and treatment options and is in agreement with the current care plan.  All questions were answered. The patient knows to call the clinic with any problems, questions or concerns. We can certainly see the patient much sooner if necessary.  Disclaimer: This note was dictated with voice recognition software. Similar sounding words can inadvertently be transcribed and may not be corrected upon review.

## 2020-12-10 NOTE — Patient Instructions (Signed)
[facility] Discharge Instructions for Patients Receiving Chemotherapy  Today you received the following immunotherapy agent: Pembrolizumab [name])  To help prevent nausea and vomiting after your treatment, we encourage you to take your nausea medication as directed by your MD.   If you develop nausea and vomiting that is not controlled by your nausea medication, call the clinic.   BELOW ARE SYMPTOMS THAT SHOULD BE REPORTED IMMEDIATELY:  *FEVER GREATER THAN 100.5 F  *CHILLS WITH OR WITHOUT FEVER  NAUSEA AND VOMITING THAT IS NOT CONTROLLED WITH YOUR NAUSEA MEDICATION  *UNUSUAL SHORTNESS OF BREATH  *UNUSUAL BRUISING OR BLEEDING  TENDERNESS IN MOUTH AND THROAT WITH OR WITHOUT PRESENCE OF ULCERS  *URINARY PROBLEMS  *BOWEL PROBLEMS  UNUSUAL RASH Items with * indicate a potential emergency and should be followed up as soon as possible.  Feel free to call the clinic should you have any questions or concerns. The clinic phone number is [phone].  Please show the [facility] at check-in to the Emergency Department and triage nurse.

## 2020-12-11 MED ORDER — DEXAMETHASONE 4 MG PO TABS
ORAL_TABLET | ORAL | Status: AC
  Filled 2020-12-11: qty 5

## 2020-12-13 NOTE — Progress Notes (Deleted)
Cardiology Office Note:   Date:  12/13/2020  NAME:  Kevin Lambert.    MRN: [id number] DOB:  [birth year]   PCP:  [name], MD  Cardiologist:  No primary care provider on file.  Electrophysiologist:  None   Referring MD: [name], MD   No chief complaint on file. ***  History of Present Illness:   Kevin Demas. is a 65 y.o. male with a hx of stage IV lung CA, COPD, HTN, CKD IV who presents for follow-up of edema and SOB. He was seen 09/24/2020. His edema was attributed to his low albumin (2.5 at the time). His echo shows normal LV function and no elevated LA pressure. His BNP was 47, inconsistent with CHF. 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 His SOB was attributed to anemia (hgb 9.1 at the time) and he has been diagnosed with moderate COPD.    Problem List 1. NSCLC Stage IV -adenocarcinoma  -on palliative chemotherapy  2. HTN 3.  CKD stage III 4.  Diabetes 5. Tobacco abuse -50 pack years  6. CKD III -eGFR 39 7. Moderate COPD -PFT 11/18/2020  Past Medical History: Past Medical History:  Diagnosis Date  . Arthritis   . Chronic kidney disease   . Depression   . DM (diabetes mellitus) ([place])    type II  . Dyslipidemia   . Dyspnea    unable to walk much - he thinks it is from   . GERD (gastroesophageal reflux disease)   . Hepatitis    Hepatitis C- treated  . History of kidney stones   . HTN (hypertension)   . Neuropathy   . nscl ca dx'd 10/2019  . OSA on CPAP   . PTSD (post-traumatic stress disorder)     Past Surgical History: Past Surgical History:  Procedure Laterality Date  . BIOPSY OF MEDIASTINAL MASS  12/10/2019   Procedure: BIOPSY OF MEDIASTINAL MASS;  Surgeon: Garner Nash, DO;  Location: [facility];  Service: Pulmonary;;  right upper lobe  . BRONCHIAL NEEDLE ASPIRATION BIOPSY  12/10/2019   Procedure: BRONCHIAL NEEDLE ASPIRATION BIOPSIES;  Surgeon: Garner Nash, DO;  Location: [facility];  Service:  Pulmonary;;  . COLONOSCOPY    . IR IMAGING GUIDED PORT INSERTION  12/31/2019  . kidney stone    . URETERAL STENT PLACEMENT    . Ureteral Stent Removed    . VIDEO BRONCHOSCOPY WITH ENDOBRONCHIAL ULTRASOUND N/A 12/10/2019   Procedure: VIDEO BRONCHOSCOPY WITH ENDOBRONCHIAL ULTRASOUND;  Surgeon: Garner Nash, DO;  Location: [facility];  Service: Pulmonary;  Laterality: N/A;    Current Medications: No outpatient medications have been marked as taking for the 12/15/20 encounter (Appointment) with O'Neal, Cassie Freer, MD.     Allergies:    Metformin and related, Naproxen, and Oxycontin [oxycodone]   Social History: Social History   Socioeconomic History  . Marital status: Single    Spouse name: Not on file  . Number of children: Not on file  . Years of education: Not on file  . Highest education level: Not on file  Occupational History  . Not on file  Tobacco Use  . Smoking status: Current Every Day Smoker    Packs/day: 0.60    Years: 50.00    Pack years: 30.00    Types: Cigarettes  . Smokeless tobacco: Never Used  . Tobacco comment: 12/10/20: 1-2 Black & Milds and 4 cigarettes daily  Vaping Use  . Vaping Use: Never used  Substance and Sexual Activity  . Alcohol use: Yes    Alcohol/week: 6.0 standard drinks    Types: 6 Cans of beer per week  . Drug use: Not Currently  . Sexual activity: Not on file  Other Topics Concern  . Not on file  Social History Narrative  . Not on file   Social Determinants of Health   Financial Resource Strain: Not on file  Food Insecurity: Not on file  Transportation Needs: Not on file  Physical Activity: Not on file  Stress: Not on file  Social Connections: Not on file     Family History: The patient's ***family history includes Diabetes in his father; Liver disease in his mother.  ROS:   All other ROS reviewed and negative. Pertinent positives noted in the HPI.     EKGs/Labs/Other Studies Reviewed:   The following studies were  personally reviewed by me today:  EKG:  EKG is *** ordered today.  The ekg ordered today demonstrates ***, and was personally reviewed by me.   TTE 08/06/2020 1. Left ventricular ejection fraction, by estimation, is 55%. The left  ventricle has normal function. The left ventricle has no regional wall  motion abnormalities. There is moderate left ventricular hypertrophy. Left  ventricular diastolic parameters are  consistent with Grade I diastolic dysfunction (impaired relaxation).  2. Right ventricular systolic function is normal. The right ventricular  size is normal. Tricuspid regurgitation signal is inadequate for assessing  PA pressure.  3. The mitral valve is normal in structure. No evidence of mitral valve  regurgitation. No evidence of mitral stenosis.  4. The aortic valve is tricuspid. Aortic valve regurgitation is not  visualized. Mild aortic valve sclerosis is present, with no evidence of  aortic valve stenosis.  5. Aortic dilatation noted. There is mild dilatation of the aortic root,  measuring 38 mm.  6. The inferior vena cava is normal in size with greater than 50%  respiratory variability, suggesting right atrial pressure of 3 mmHg.   Recent Labs: 01/28/2020: Magnesium 1.5 09/24/2020: BNP 47.4 12/10/2020: ALT 30; BUN 34; Creatinine 1.90; Hemoglobin 11.6; Platelet Count 168; Potassium 4.7; Sodium 131; TSH 0.280   Recent Lipid Panel No results found for: CHOL, TRIG, HDL, CHOLHDL, VLDL, LDLCALC, LDLDIRECT  Physical Exam:   VS:  There were no vitals taken for this visit.   Wt Readings from Last 3 Encounters:  12/10/20 225 lb (102.1 kg)  11/20/20 222 lb 6.4 oz (100.9 kg)  11/19/20 222 lb 4.8 oz (100.8 kg)    General: Well nourished, well developed, in no acute distress Head: Atraumatic, normal size  Eyes: PEERLA, EOMI  Neck: Supple, no JVD Endocrine: No thryomegaly Cardiac: Normal S1, S2; RRR; no murmurs, rubs, or gallops Lungs: Clear to auscultation  bilaterally, no wheezing, rhonchi or rales  Abd: Soft, nontender, no hepatomegaly  Ext: No edema, pulses 2+ Musculoskeletal: No deformities, BUE and BLE strength normal and equal Skin: Warm and dry, no rashes   Neuro: Alert and oriented to person, place, time, and situation, CNII-XII grossly intact, no focal deficits  Psych: Normal mood and affect   ASSESSMENT:   Kevin Zinda. is a 65 y.o. male who presents for the following: No diagnosis found.  PLAN:   There are no diagnoses linked to this encounter.  Disposition: No follow-ups on file.  Medication Adjustments/Labs and Tests Ordered: Current medicines are reviewed at length with the patient today.  Concerns regarding medicines are outlined above.  No orders of the defined  types were placed in this encounter.  No orders of the defined types were placed in this encounter.   There are no Patient Instructions on file for this visit.   Time Spent with Patient: I have spent a total of *** minutes with patient reviewing hospital notes, telemetry, EKGs, labs and examining the patient as well as establishing an assessment and plan that was discussed with the patient.  > 50% of time was spent in direct patient care.  Signed, Addison Naegeli. Audie Box, [place]  [address], [place], [place] [address] [phone]  12/13/2020 4:35 PM

## 2020-12-14 ENCOUNTER — Telehealth: Payer: Self-pay | Admitting: Internal Medicine

## 2020-12-14 NOTE — Telephone Encounter (Signed)
Per 2/3 los, next appt scheduled

## 2020-12-15 ENCOUNTER — Ambulatory Visit: Payer: No Typology Code available for payment source | Admitting: Cardiovascular Disease

## 2020-12-15 DIAGNOSIS — R0602 Shortness of breath: Secondary | ICD-10-CM

## 2020-12-15 DIAGNOSIS — R609 Edema, unspecified: Secondary | ICD-10-CM

## 2020-12-15 NOTE — Progress Notes (Signed)
Cardiology Office Note:   Date:  12/16/2020  NAME:  Kevin Lambert.    MRN: [id number] DOB:  December 01, 1955   PCP:  [name], MD  Cardiologist:  No primary care provider on file.   Referring MD: [name], MD   Chief Complaint  Patient presents with  . Follow-up   History of Present Illness:   Kevin Lambert. is a 65 y.o. male with a hx of NSCLC, COPD, tobacco abuse, obesity who presents for follow-up of edema. He was seen several months ago. BNP 47. IVC collapsing on echo. Edema attributed to hypoalbuminemia. SOB attributed to anemia and COPD.   He reports his lower extremity edema has improved.  He is no longer taking Lasix.  I discussed with him at our last appointment I thought his edema was related to hypoalbuminemia as well as AKI in the setting of being on chemotherapeutic agents that affect kidney function.  His edema has improved.  He is not taking Lasix.  His BP is elevated 158/84.  Saturations 99%.  Diagnosed with COPD and still smoking.  He is going to work on this.  He reports he is in between primary care physicians.  Seems to be doing stable.  Blood pressure is elevated needs better control.  Problem List 1. NSCLC Stage IV -adenocarcinoma  -on palliative chemotherapy  2. HTN 3.  CKD stage III 4.  Diabetes 5. Tobacco abuse -50 pack years  6. COPD -moderate   Past Medical History: Past Medical History:  Diagnosis Date  . Arthritis   . Chronic kidney disease   . Depression   . DM (diabetes mellitus) ([employer])    type II  . Dyslipidemia   . Dyspnea    unable to walk much - he thinks it is from   . GERD (gastroesophageal reflux disease)   . Hepatitis    Hepatitis C- treated  . History of kidney stones   . HTN (hypertension)   . Neuropathy   . nscl ca dx'd 10/2019  . OSA on CPAP   . PTSD (post-traumatic stress disorder)     Past Surgical History: Past Surgical History:  Procedure Laterality Date  . BIOPSY OF MEDIASTINAL MASS  12/10/2019   Procedure:  BIOPSY OF MEDIASTINAL MASS;  Surgeon: Garner Nash, DO;  Location: [facility];  Service: Pulmonary;;  right upper lobe  . BRONCHIAL NEEDLE ASPIRATION BIOPSY  12/10/2019   Procedure: BRONCHIAL NEEDLE ASPIRATION BIOPSIES;  Surgeon: Garner Nash, DO;  Location: [facility];  Service: Pulmonary;;  . COLONOSCOPY    . IR IMAGING GUIDED PORT INSERTION  12/31/2019  . kidney stone    . URETERAL STENT PLACEMENT    . Ureteral Stent Removed    . VIDEO BRONCHOSCOPY WITH ENDOBRONCHIAL ULTRASOUND N/A 12/10/2019   Procedure: VIDEO BRONCHOSCOPY WITH ENDOBRONCHIAL ULTRASOUND;  Surgeon: Garner Nash, DO;  Location: [facility];  Service: Pulmonary;  Laterality: N/A;    Current Medications: Current Meds  Medication Sig  . albuterol (VENTOLIN HFA) 108 (90 Base) MCG/ACT inhaler Inhale 2 puffs into the lungs every 4 (four) hours as needed for wheezing or shortness of breath.  Marland Kitchen amLODipine (NORVASC) 10 MG tablet Take 10 mg by mouth daily.  . Ascorbic Acid (VITAMIN C) 1000 MG tablet Take 1,000 mg by mouth daily.  Marland Kitchen aspirin EC 81 MG tablet Take 81 mg by mouth daily.  Marland Kitchen atorvastatin (LIPITOR) 10 MG tablet Take 10 mg by mouth daily.  . bisacodyl (DULCOLAX) 5 MG EC tablet  Take 5 mg by mouth daily as needed for moderate constipation.  . carvedilol (COREG) 25 MG tablet Take 1 tablet (25 mg total) by mouth 2 (two) times daily.  . cholecalciferol (VITAMIN D3) 25 MCG (1000 UNIT) tablet Take 1,000 Units by mouth daily.  . Cyanocobalamin (VITAMIN B 12 PO) Take 1 tablet by mouth daily.  . folic acid (FOLVITE) 1 MG tablet Take 1 tablet (1 mg total) by mouth daily. Pt needs to pick up today and start ASAP . He will pay out of pocket. He is waiting on the [place] to send his folic acid which will be next week.  . furosemide (LASIX) 20 MG tablet Take 1 tablet (20 mg total) by mouth daily. As needed  . glipiZIDE (GLUCOTROL) 5 MG tablet Take 5 mg by mouth daily before breakfast.   . lidocaine-prilocaine (EMLA) cream Apply to  Port-A-Cath site 30-60-minute before treatment.  . magic mouthwash SOLN Take 5 mLs by mouth 4 (four) times daily as needed for mouth pain. Swish and swallow or spit  . nicotine (NICODERM CQ) 21 mg/24hr patch Place 1 patch (21 mg total) onto the skin daily.  . nicotine polacrilex (COMMIT) 4 MG lozenge Take 1 lozenge (4 mg total) by mouth as needed for smoking cessation.  . potassium chloride SA (KLOR-CON) 20 MEQ tablet Take 1 tablet (20 mEq total) by mouth daily.  . predniSONE (DELTASONE) 20 MG tablet 5 tablet p.o. daily for 5 days, then 4 tablet p.o. daily for 5 days, then 3 tablet p.o. daily for 5 days, then 2 tablet p.o. daily for 5 days and then 1 tablet p.o. daily for 5 days.  . pregabalin (LYRICA) 25 MG capsule Take 1 capsule (25 mg total) by mouth daily.  Marland Kitchen Propylene Glycol (SYSTANE BALANCE OP) Place 1 drop into both eyes daily.  . sildenafil (VIAGRA) 100 MG tablet TAKE ONE TABLET BY MOUTH AS INSTRUCTED (TAKE 1 HOUR PRIOR TO SEXUAL ACTIVITY *DO NOT EXCEED 1 DOSE PER 24 HOUR PERIOD*)  . triamterene-hydrochlorothiazide (MAXZIDE-25) 37.5-25 MG tablet Take 1 tablet by mouth daily.  Marland Kitchen umeclidinium-vilanterol (ANORO ELLIPTA) 62.5-25 MCG/INH AEPB Inhale 1 puff into the lungs daily.  Marland Kitchen umeclidinium-vilanterol (ANORO ELLIPTA) 62.5-25 MCG/INH AEPB Inhale 1 puff into the lungs daily.  Marland Kitchen zinc gluconate 50 MG tablet Take 50 mg by mouth daily.  . [DISCONTINUED] furosemide (LASIX) 20 MG tablet Take 1 tablet (20 mg total) by mouth 2 (two) times daily.     Allergies:    Metformin and related, Naproxen, and Oxycontin [oxycodone]   Social History: Social History   Socioeconomic History  . Marital status: Single    Spouse name: Not on file  . Number of children: Not on file  . Years of education: Not on file  . Highest education level: Not on file  Occupational History  . Not on file  Tobacco Use  . Smoking status: Current Every Day Smoker    Packs/day: 0.60    Years: 50.00    Pack years: 30.00     Types: Cigarettes  . Smokeless tobacco: Never Used  . Tobacco comment: 12/10/20: 1-2 Black & Milds and 4 cigarettes daily  Vaping Use  . Vaping Use: Never used  Substance and Sexual Activity  . Alcohol use: Yes    Alcohol/week: 6.0 standard drinks    Types: 6 Cans of beer per week  . Drug use: Not Currently  . Sexual activity: Not on file  Other Topics Concern  . Not on file  Social History Narrative  . Not on file   Social Determinants of Health   Financial Resource Strain: Not on file  Food Insecurity: Not on file  Transportation Needs: Not on file  Physical Activity: Not on file  Stress: Not on file  Social Connections: Not on file     Family History: The patient's family history includes Diabetes in his father; Liver disease in his mother.  ROS:   All other ROS reviewed and negative. Pertinent positives noted in the HPI.     EKGs/Labs/Other Studies Reviewed:   The following studies were personally reviewed by me today:  TTE 08/06/2020 1. Left ventricular ejection fraction, by estimation, is 55%. The left  ventricle has normal function. The left ventricle has no regional wall  motion abnormalities. There is moderate left ventricular hypertrophy. Left  ventricular diastolic parameters are  consistent with Grade I diastolic dysfunction (impaired relaxation).  2. Right ventricular systolic function is normal. The right ventricular  size is normal. Tricuspid regurgitation signal is inadequate for assessing  PA pressure.  3. The mitral valve is normal in structure. No evidence of mitral valve  regurgitation. No evidence of mitral stenosis.  4. The aortic valve is tricuspid. Aortic valve regurgitation is not  visualized. Mild aortic valve sclerosis is present, with no evidence of  aortic valve stenosis.  5. Aortic dilatation noted. There is mild dilatation of the aortic root,  measuring 38 mm.  6. The inferior vena cava is normal in size with greater than 50%   respiratory variability, suggesting right atrial pressure of 3 mmHg.   Recent Labs: 01/28/2020: Magnesium 1.5 09/24/2020: BNP 47.4 12/10/2020: ALT 30; BUN 34; Creatinine 1.90; Hemoglobin 11.6; Platelet Count 168; Potassium 4.7; Sodium 131; TSH 0.280   Recent Lipid Panel No results found for: CHOL, TRIG, HDL, CHOLHDL, VLDL, LDLCALC, LDLDIRECT  Physical Exam:   VS:  Ht 5\' 9"  (1.753 m)   Wt 221 lb 3.2 oz (100.3 kg)   SpO2 99%   BMI 32.67 kg/m    Wt Readings from Last 3 Encounters:  12/16/20 221 lb 3.2 oz (100.3 kg)  12/10/20 225 lb (102.1 kg)  11/20/20 222 lb 6.4 oz (100.9 kg)    General: Well nourished, well developed, in no acute distress Head: Atraumatic, normal size  Eyes: PEERLA, EOMI  Neck: Supple, no JVD Endocrine: No thryomegaly Cardiac: Normal S1, S2; RRR; no murmurs, rubs, or gallops Lungs: Clear to auscultation bilaterally, no wheezing, rhonchi or rales  Abd: Soft, nontender, no hepatomegaly  Ext: No edema, pulses 2+ Musculoskeletal: No deformities, BUE and BLE strength normal and equal Skin: Warm and dry, no rashes   Neuro: Alert and oriented to person, place, time, and situation, CNII-XII grossly intact, no focal deficits  Psych: Normal mood and affect   ASSESSMENT:   Kevin Lambert. is a 65 y.o. male who presents for the following: 1. Edema, unspecified type   2. SOB (shortness of breath) on exertion   3. Primary hypertension   4. Tobacco abuse   5. Swelling of both upper extremities    PLAN:   1. Edema, unspecified type -Edema has improved.  This was likely related to hypoalbuminemia that has resolved.  He also had AKI that is improved.  His oncologist has changed some chemotherapeutic agents.  Moving forward I recommended Lasix 20 mg daily as needed.  2. SOB (shortness of breath) on exertion -Moderate COPD.  No evidence of heart failure.  BNP normal.  IVC collapsing.  His symptoms  have improved.  I suspect they are related to anemia and COPD.  He is going  to work on quitting smoking.  3. Primary hypertension -Add Coreg 25 twice daily for hypertension.  Continue amlodipine 10 mg daily.  He is on Maxide 37.5-25 mg daily.  Volume status acceptable today.  Continue Lasix 20 mg daily.  I did inform him that he should consider seeing a nephrologist if his kidney function does not improve.  4. Tobacco abuse -Smoking cessation counseling provided today.  Disposition: Return in about 6 months (around 06/15/2021).  Medication Adjustments/Labs and Tests Ordered: Current medicines are reviewed at length with the patient today.  Concerns regarding medicines are outlined above.  No orders of the defined types were placed in this encounter.  Meds ordered this encounter  Medications  . carvedilol (COREG) 25 MG tablet    Sig: Take 1 tablet (25 mg total) by mouth 2 (two) times daily.    Dispense:  180 tablet    Refill:  3  . furosemide (LASIX) 20 MG tablet    Sig: Take 1 tablet (20 mg total) by mouth daily. As needed    Dispense:  20 tablet    Refill:  0    Patient Instructions  Medication Instructions:  Start Carvedilol 25 mg twice daily  Start Lasix 20 mg as needed  *If you need a refill on your cardiac medications before your next appointment, please call your pharmacy*  Follow-Up: At [facility], you and your health needs are our priority.  As part of our continuing mission to provide you with exceptional heart care, we have created designated Provider Care Teams.  These Care Teams include your primary Cardiologist (physician) and Advanced Practice Providers (APPs -  Physician Assistants and Nurse Practitioners) who all work together to provide you with the care you need, when you need it.  We recommend signing up for the patient portal called "MyChart".  Sign up information is provided on this After Visit Summary.  MyChart is used to connect with patients for Virtual Visits (Telemedicine).  Patients are able to view lab/test results, encounter  notes, upcoming appointments, etc.  Non-urgent messages can be sent to your provider as well.   To learn more about what you can do with MyChart, go to [email or web address].    Your next appointment:   6 month(s)  The format for your next appointment:   In Person  Provider:   Eleonore Chiquito, MD         Time Spent with Patient: I have spent a total of 25 minutes with patient reviewing hospital notes, telemetry, EKGs, labs and examining the patient as well as establishing an assessment and plan that was discussed with the patient.  > 50% of time was spent in direct patient care.  Signed, Addison Naegeli. Audie Box, [place]  [address], [place], [place] [address] [phone]  12/16/2020 11:42 AM

## 2020-12-16 ENCOUNTER — Encounter: Payer: Self-pay | Admitting: Cardiovascular Disease

## 2020-12-16 ENCOUNTER — Other Ambulatory Visit: Payer: Self-pay

## 2020-12-16 ENCOUNTER — Ambulatory Visit (INDEPENDENT_AMBULATORY_CARE_PROVIDER_SITE_OTHER): Payer: No Typology Code available for payment source | Admitting: Cardiovascular Disease

## 2020-12-16 VITALS — Ht 69.0 in | Wt 221.2 lb

## 2020-12-16 DIAGNOSIS — Z720 Tobacco use: Secondary | ICD-10-CM | POA: Diagnosis not present

## 2020-12-16 DIAGNOSIS — M7989 Other specified soft tissue disorders: Secondary | ICD-10-CM

## 2020-12-16 DIAGNOSIS — I10 Essential (primary) hypertension: Secondary | ICD-10-CM | POA: Diagnosis not present

## 2020-12-16 DIAGNOSIS — R609 Edema, unspecified: Secondary | ICD-10-CM | POA: Diagnosis not present

## 2020-12-16 DIAGNOSIS — R0602 Shortness of breath: Secondary | ICD-10-CM | POA: Diagnosis not present

## 2020-12-16 MED ORDER — CARVEDILOL 25 MG PO TABS: 25.0000 mg | ORAL_TABLET | Freq: Two times a day (BID) | ORAL | 3 refills | Status: DC

## 2020-12-16 MED ORDER — FUROSEMIDE 20 MG PO TABS: 20.0000 mg | ORAL_TABLET | Freq: Every day | ORAL | 0 refills | Status: DC

## 2020-12-16 NOTE — Patient Instructions (Signed)
Medication Instructions:  Start Carvedilol 25 mg twice daily  Start Lasix 20 mg as needed  *If you need a refill on your cardiac medications before your next appointment, please call your pharmacy*  Follow-Up: At [facility], you and your health needs are our priority.  As part of our continuing mission to provide you with exceptional heart care, we have created designated Provider Care Teams.  These Care Teams include your primary Cardiologist (physician) and Advanced Practice Providers (APPs -  Physician Assistants and Nurse Practitioners) who all work together to provide you with the care you need, when you need it.  We recommend signing up for the patient portal called "MyChart".  Sign up information is provided on this After Visit Summary.  MyChart is used to connect with patients for Virtual Visits (Telemedicine).  Patients are able to view lab/test results, encounter notes, upcoming appointments, etc.  Non-urgent messages can be sent to your provider as well.   To learn more about what you can do with MyChart, go to [email or web address].    Your next appointment:   6 month(s)  The format for your next appointment:   In Person  Provider:   Eleonore Chiquito, MD

## 2020-12-31 ENCOUNTER — Inpatient Hospital Stay: Payer: No Typology Code available for payment source

## 2020-12-31 ENCOUNTER — Telehealth: Payer: Self-pay | Admitting: Medical Oncology

## 2020-12-31 ENCOUNTER — Other Ambulatory Visit: Payer: Self-pay

## 2020-12-31 ENCOUNTER — Encounter: Payer: Self-pay | Admitting: Medical Oncology

## 2020-12-31 ENCOUNTER — Inpatient Hospital Stay (HOSPITAL_BASED_OUTPATIENT_CLINIC_OR_DEPARTMENT_OTHER): Payer: No Typology Code available for payment source | Admitting: Internal Medicine

## 2020-12-31 VITALS — BP 133/72 | HR 72 | Temp 97.6°F | Resp 15 | Ht 69.0 in | Wt 221.0 lb

## 2020-12-31 DIAGNOSIS — Z95828 Presence of other vascular implants and grafts: Secondary | ICD-10-CM

## 2020-12-31 DIAGNOSIS — C3491 Malignant neoplasm of unspecified part of right bronchus or lung: Secondary | ICD-10-CM

## 2020-12-31 DIAGNOSIS — I10 Essential (primary) hypertension: Secondary | ICD-10-CM

## 2020-12-31 DIAGNOSIS — Z5112 Encounter for antineoplastic immunotherapy: Secondary | ICD-10-CM | POA: Diagnosis not present

## 2020-12-31 LAB — CBC WITH DIFFERENTIAL (CANCER CENTER ONLY)
Abs Immature Granulocytes: 0.05 10*3/uL (ref 0.00–0.07)
Basophils Absolute: 0 10*3/uL (ref 0.0–0.1)
Basophils Relative: 1 %
Eosinophils Absolute: 0.2 10*3/uL (ref 0.0–0.5)
Eosinophils Relative: 3 %
HCT: 34.8 % — ABNORMAL LOW (ref 39.0–52.0)
Hemoglobin: 11.9 g/dL — ABNORMAL LOW (ref 13.0–17.0)
Immature Granulocytes: 1 %
Lymphocytes Relative: 36 %
Lymphs Abs: 2.2 10*3/uL (ref 0.7–4.0)
MCH: 31.4 pg (ref 26.0–34.0)
MCHC: 34.2 g/dL (ref 30.0–36.0)
MCV: 91.8 fL (ref 80.0–100.0)
Monocytes Absolute: 0.6 10*3/uL (ref 0.1–1.0)
Monocytes Relative: 10 %
Neutro Abs: 3 10*3/uL (ref 1.7–7.7)
Neutrophils Relative %: 49 %
Platelet Count: 188 10*3/uL (ref 150–400)
RBC: 3.79 MIL/uL — ABNORMAL LOW (ref 4.22–5.81)
RDW: 13.6 % (ref 11.5–15.5)
WBC Count: 6 10*3/uL (ref 4.0–10.5)
nRBC: 0 % (ref 0.0–0.2)

## 2020-12-31 LAB — CMP (CANCER CENTER ONLY)
ALT: 26 U/L (ref 0–44)
AST: 17 U/L (ref 15–41)
Albumin: 3.4 g/dL — ABNORMAL LOW (ref 3.5–5.0)
Alkaline Phosphatase: 68 U/L (ref 38–126)
Anion gap: 8 (ref 5–15)
BUN: 18 mg/dL (ref 8–23)
CO2: 25 mmol/L (ref 22–32)
Calcium: 9.6 mg/dL (ref 8.9–10.3)
Chloride: 99 mmol/L (ref 98–111)
Creatinine: 2.21 mg/dL — ABNORMAL HIGH (ref 0.61–1.24)
GFR, Estimated: 32 mL/min — ABNORMAL LOW (ref >60)
Glucose, Bld: 186 mg/dL — ABNORMAL HIGH (ref 70–99)
Potassium: 4 mmol/L (ref 3.5–5.1)
Sodium: 132 mmol/L — ABNORMAL LOW (ref 135–145)
Total Bilirubin: 0.3 mg/dL (ref 0.3–1.2)
Total Protein: 6.5 g/dL (ref 6.5–8.1)

## 2020-12-31 LAB — TSH: TSH: 0.579 u[IU]/mL (ref 0.320–4.118)

## 2020-12-31 MED ORDER — HEPARIN SOD (PORK) LOCK FLUSH 100 UNIT/ML IV SOLN
500.0000 [IU] | Freq: Once | INTRAVENOUS | Status: AC | PRN
  Administered 2020-12-31: 500 [IU]
  Filled 2020-12-31: qty 5

## 2020-12-31 MED ORDER — SODIUM CHLORIDE 0.9% FLUSH
10.0000 mL | Freq: Once | INTRAVENOUS | Status: AC
  Administered 2020-12-31: 10 mL
  Filled 2020-12-31: qty 10

## 2020-12-31 MED ORDER — SODIUM CHLORIDE 0.9% FLUSH
10.0000 mL | INTRAVENOUS | Status: DC | PRN
  Administered 2020-12-31: 10 mL
  Filled 2020-12-31: qty 10

## 2020-12-31 MED ORDER — PROCHLORPERAZINE MALEATE 10 MG PO TABS: 10.0000 mg | ORAL_TABLET | Freq: Once | ORAL | Status: DC

## 2020-12-31 MED ORDER — SODIUM CHLORIDE 0.9 % IV SOLN
Freq: Once | INTRAVENOUS | Status: AC
  Filled 2020-12-31: qty 250

## 2020-12-31 MED ORDER — SODIUM CHLORIDE 0.9 % IV SOLN
200.0000 mg | Freq: Once | INTRAVENOUS | Status: AC
  Administered 2020-12-31: 200 mg via INTRAVENOUS
  Filled 2020-12-31: qty 8

## 2020-12-31 MED ORDER — SODIUM CHLORIDE 0.9 % IV SOLN
Freq: Once | INTRAVENOUS | Status: AC
Start: 2020-12-31 — End: 2020-12-31
  Filled 2020-12-31: qty 250

## 2020-12-31 NOTE — Progress Notes (Signed)
Per Dr. Providence Lanius to treat with today's labs

## 2020-12-31 NOTE — Patient Instructions (Signed)
[facility] Discharge Instructions for Patients Receiving Chemotherapy  Today you received the following immunotherapy agent: Pembrolizumab [name])  To help prevent nausea and vomiting after your treatment, we encourage you to take your nausea medication as directed by your MD.   If you develop nausea and vomiting that is not controlled by your nausea medication, call the clinic.   BELOW ARE SYMPTOMS THAT SHOULD BE REPORTED IMMEDIATELY:  *FEVER GREATER THAN 100.5 F  *CHILLS WITH OR WITHOUT FEVER  NAUSEA AND VOMITING THAT IS NOT CONTROLLED WITH YOUR NAUSEA MEDICATION  *UNUSUAL SHORTNESS OF BREATH  *UNUSUAL BRUISING OR BLEEDING  TENDERNESS IN MOUTH AND THROAT WITH OR WITHOUT PRESENCE OF ULCERS  *URINARY PROBLEMS  *BOWEL PROBLEMS  UNUSUAL RASH Items with * indicate a potential emergency and should be followed up as soon as possible.  Feel free to call the clinic should you have any questions or concerns. The clinic phone number is [phone].  Please show the [facility] at check-in to the Emergency Department and triage nurse.

## 2020-12-31 NOTE — Patient Instructions (Signed)
Implanted Port Insertion, Care After This sheet gives you information about how to care for yourself after your procedure. Your health care provider may also give you more specific instructions. If you have problems or questions, contact your health care provider. What can I expect after the procedure? After the procedure, it is common to have:  Discomfort at the port insertion site.  Bruising on the skin over the port. This should improve over 3-4 days. Follow these instructions at home: Port care  After your port is placed, you will get a manufacturer's information card. The card has information about your port. Keep this card with you at all times.  Take care of the port as told by your health care provider. Ask your health care provider if you or a family member can get training for taking care of the port at home. A home health care nurse may also take care of the port.  Make sure to remember what type of port you have. Incision care  Follow instructions from your health care provider about how to take care of your port insertion site. Make sure you: ? Wash your hands with soap and water before and after you change your bandage (dressing). If soap and water are not available, use hand sanitizer. ? Change your dressing as told by your health care provider. ? Leave stitches (sutures), skin glue, or adhesive strips in place. These skin closures may need to stay in place for 2 weeks or longer. If adhesive strip edges start to loosen and curl up, you may trim the loose edges. Do not remove adhesive strips completely unless your health care provider tells you to do that.  Check your port insertion site every day for signs of infection. Check for: ? Redness, swelling, or pain. ? Fluid or blood. ? Warmth. ? Pus or a bad smell.      Activity  Return to your normal activities as told by your health care provider. Ask your health care provider what activities are safe for you.  Do not  lift anything that is heavier than 10 lb (4.5 kg), or the limit that you are told, until your health care provider says that it is safe. General instructions  Take over-the-counter and prescription medicines only as told by your health care provider.  Do not take baths, swim, or use a hot tub until your health care provider approves. Ask your health care provider if you may take showers. You may only be allowed to take sponge baths.  Do not drive for 24 hours if you were given a sedative during your procedure.  Wear a medical alert bracelet in case of an emergency. This will tell any health care providers that you have a port.  Keep all follow-up visits as told by your health care provider. This is important. Contact a health care provider if:  You cannot flush your port with saline as directed, or you cannot draw blood from the port.  You have a fever or chills.  You have redness, swelling, or pain around your port insertion site.  You have fluid or blood coming from your port insertion site.  Your port insertion site feels warm to the touch.  You have pus or a bad smell coming from the port insertion site. Get help right away if:  You have chest pain or shortness of breath.  You have bleeding from your port that you cannot control. Summary  Take care of the port as told by your   health care provider. Keep the manufacturer's information card with you at all times.  Change your dressing as told by your health care provider.  Contact a health care provider if you have a fever or chills or if you have redness, swelling, or pain around your port insertion site.  Keep all follow-up visits as told by your health care provider. This information is not intended to replace advice given to you by your health care provider. Make sure you discuss any questions you have with your health care provider. Document Revised: 05/22/2018 Document Reviewed: 05/22/2018 Elsevier Patient Education   2021 [employer]vier Inc.  

## 2020-12-31 NOTE — Telephone Encounter (Signed)
Requests to add more work accommodations/restrictions for his return to work. 1.Pt will need chair or stool to sit on  2. No prolonged stranding over 2 hours without a break.  3. No lifting over 10 lbs.  Pt said the [facility] will be calling about this.

## 2020-12-31 NOTE — Progress Notes (Signed)
Kevin Lambert [phone]   [phone]  OFFICE PROGRESS NOTE  Kevin Petrin, MD [address] [place]  [place] [address]  DIAGNOSIS: Stage IV (T2b, N3, M1a) non-small cell lung cancer, adenocarcinoma presented with large right upper lobe lung mass in addition to mediastinal and right supraclavicular lymphadenopathy as well as bilateral pulmonary nodules diagnosed in February 2021.  MOLECULAR STUDY by [employer]:  KRASG12C, 4.3%, Binimetinib  [id number], 0.9%, Niraparib, Olaparib, Rucaparib,Talazoparib, Tazemetostat  [id number], 1.7%, None   PRIOR THERAPY: None  CURRENT THERAPY: Systemic chemotherapy with carboplatin for AUC of 5, Alimta 500 mg/M2 and Keytruda 200 mg IV every 3 weeks.  First dose December 31, 2019.  Status post 16 cycles.  The last few cycles he has been treated with single agent Keytruda secondary to renal insufficiency.  INTERVAL HISTORY: Kevin Lambert. 65 y.o. male returns to the clinic today for follow-up visit.  The patient is feeling fine today with no concerning complaints.  He would like to go back to work.  He denied having any current chest pain, shortness of breath, cough or hemoptysis.  He denied having any fever or chills.  He has no nausea, vomiting, diarrhea or constipation.  He has no headache or visual changes.  He denied having any fever or chills.  He continues to tolerate his treatment with Keytruda fairly well.  The patient is here today for evaluation before starting cycle #17.   MEDICAL HISTORY: Past Medical History:  Diagnosis Date  . Arthritis   . Chronic kidney disease   . Depression   . DM (diabetes mellitus) ([place])    type II  . Dyslipidemia   . Dyspnea    unable to walk much - he thinks it is from   . GERD (gastroesophageal reflux disease)   . Hepatitis    Hepatitis C- treated  . History of kidney stones   . HTN (hypertension)   . Neuropathy   . nscl ca dx'd 10/2019  . OSA on CPAP   .  PTSD (post-traumatic stress disorder)     ALLERGIES:  is allergic to metformin and related, naproxen, and oxycontin [oxycodone].  MEDICATIONS:  Current Outpatient Medications  Medication Sig Dispense Refill  . albuterol (VENTOLIN HFA) 108 (90 Base) MCG/ACT inhaler Inhale 2 puffs into the lungs every 4 (four) hours as needed for wheezing or shortness of breath. 18 g 0  . amLODipine (NORVASC) 10 MG tablet Take 10 mg by mouth daily.    . Ascorbic Acid (VITAMIN C) 1000 MG tablet Take 1,000 mg by mouth daily.    Marland Kitchen aspirin EC 81 MG tablet Take 81 mg by mouth daily.    Marland Kitchen atorvastatin (LIPITOR) 10 MG tablet Take 10 mg by mouth daily.    . bisacodyl (DULCOLAX) 5 MG EC tablet Take 5 mg by mouth daily as needed for moderate constipation.    . carvedilol (COREG) 25 MG tablet Take 1 tablet (25 mg total) by mouth 2 (two) times daily. 180 tablet 3  . cholecalciferol (VITAMIN D3) 25 MCG (1000 UNIT) tablet Take 1,000 Units by mouth daily.    . Cyanocobalamin (VITAMIN B 12 PO) Take 1 tablet by mouth daily.    . folic acid (FOLVITE) 1 MG tablet Take 1 tablet (1 mg total) by mouth daily. Pt needs to pick up today and start ASAP . He will pay out of pocket. He is waiting on the [place] to send his folic acid which will be  next week. 7 tablet 0  . furosemide (LASIX) 20 MG tablet Take 1 tablet (20 mg total) by mouth daily. As needed 20 tablet 0  . glipiZIDE (GLUCOTROL) 5 MG tablet Take 5 mg by mouth daily before breakfast.     . lidocaine-prilocaine (EMLA) cream Apply to Port-A-Cath site 30-60-minute before treatment. 30 g 0  . magic mouthwash SOLN Take 5 mLs by mouth 4 (four) times daily as needed for mouth pain. Swish and swallow or spit 240 mL 2  . nicotine (NICODERM CQ) 21 mg/24hr patch Place 1 patch (21 mg total) onto the skin daily. 28 patch 3  . nicotine polacrilex (COMMIT) 4 MG lozenge Take 1 lozenge (4 mg total) by mouth as needed for smoking cessation. 108 tablet 3  . potassium chloride SA (KLOR-CON) 20 MEQ  tablet Take 1 tablet (20 mEq total) by mouth daily. 10 tablet 0  . predniSONE (DELTASONE) 20 MG tablet 5 tablet p.o. daily for 5 days, then 4 tablet p.o. daily for 5 days, then 3 tablet p.o. daily for 5 days, then 2 tablet p.o. daily for 5 days and then 1 tablet p.o. daily for 5 days. 75 tablet 0  . pregabalin (LYRICA) 25 MG capsule Take 1 capsule (25 mg total) by mouth daily. 30 capsule 1  . Propylene Glycol (SYSTANE BALANCE OP) Place 1 drop into both eyes daily.    . sildenafil (VIAGRA) 100 MG tablet TAKE ONE TABLET BY MOUTH AS INSTRUCTED (TAKE 1 HOUR PRIOR TO SEXUAL ACTIVITY *DO NOT EXCEED 1 DOSE PER 24 HOUR PERIOD*)    . triamterene-hydrochlorothiazide (MAXZIDE-25) 37.5-25 MG tablet Take 1 tablet by mouth daily.    Marland Kitchen umeclidinium-vilanterol (ANORO ELLIPTA) 62.5-25 MCG/INH AEPB Inhale 1 puff into the lungs daily. 60 each 11  . umeclidinium-vilanterol (ANORO ELLIPTA) 62.5-25 MCG/INH AEPB Inhale 1 puff into the lungs daily. 14 each 0  . zinc gluconate 50 MG tablet Take 50 mg by mouth daily.     No current facility-administered medications for this visit.    SURGICAL HISTORY:  Past Surgical History:  Procedure Laterality Date  . BIOPSY OF MEDIASTINAL MASS  12/10/2019   Procedure: BIOPSY OF MEDIASTINAL MASS;  Surgeon: Garner Nash, DO;  Location: [facility];  Service: Pulmonary;;  right upper lobe  . BRONCHIAL NEEDLE ASPIRATION BIOPSY  12/10/2019   Procedure: BRONCHIAL NEEDLE ASPIRATION BIOPSIES;  Surgeon: Garner Nash, DO;  Location: [facility];  Service: Pulmonary;;  . COLONOSCOPY    . IR IMAGING GUIDED PORT INSERTION  12/31/2019  . kidney stone    . URETERAL STENT PLACEMENT    . Ureteral Stent Removed    . VIDEO BRONCHOSCOPY WITH ENDOBRONCHIAL ULTRASOUND N/A 12/10/2019   Procedure: VIDEO BRONCHOSCOPY WITH ENDOBRONCHIAL ULTRASOUND;  Surgeon: Garner Nash, DO;  Location: [facility];  Service: Pulmonary;  Laterality: N/A;    REVIEW OF SYSTEMS:  A comprehensive review of systems  was negative except for: Constitutional: positive for fatigue   PHYSICAL EXAMINATION: General appearance: alert, cooperative and no distress Head: Normocephalic, without obvious abnormality, atraumatic Neck: no adenopathy, no JVD, supple, symmetrical, trachea midline and thyroid not enlarged, symmetric, no tenderness/mass/nodules Lymph nodes: Cervical, supraclavicular, and axillary nodes normal. Resp: clear to auscultation bilaterally Back: symmetric, no curvature. ROM normal. No CVA tenderness. Cardio: regular rate and rhythm, S1, S2 normal, no murmur, click, rub or gallop GI: soft, non-tender; bowel sounds normal; no masses,  no organomegaly Extremities: extremities normal, atraumatic, no cyanosis or edema  ECOG PERFORMANCE STATUS: 1 -  Symptomatic but completely ambulatory  Blood pressure 133/72, pulse 72, temperature 97.6 F (36.4 C), temperature source Tympanic, resp. rate 15, height 5\' 9"  (1.753 m), weight 221 lb (100.2 kg), SpO2 100 %.  LABORATORY DATA: Lab Results  Component Value Date   WBC 6.0 12/31/2020   HGB 11.9 (L) 12/31/2020   HCT 34.8 (L) 12/31/2020   MCV 91.8 12/31/2020   PLT 188 12/31/2020      Chemistry      Component Value Date/Time   NA 131 (L) 12/10/2020 1122   K 4.7 12/10/2020 1122   CL 100 12/10/2020 1122   CO2 24 12/10/2020 1122   BUN 34 (H) 12/10/2020 1122   CREATININE 1.90 (H) 12/10/2020 1122      Component Value Date/Time   CALCIUM 9.8 12/10/2020 1122   ALKPHOS 52 12/10/2020 1122   AST 12 (L) 12/10/2020 1122   ALT 30 12/10/2020 1122   BILITOT 0.3 12/10/2020 1122       RADIOGRAPHIC STUDIES: No results found.  ASSESSMENT AND PLAN: This is a very pleasant 65 years old African-American male recently diagnosed with a stage IV non-small cell lung cancer, adenocarcinoma with no actionable mutations presented with large right lower lobe lung mass in addition to mediastinal and right supraclavicular lymphadenopathy as well as bilateral pulmonary  nodules diagnosed in February 2021. I explained to the patient that he has incurable condition and all the treatment options will be of palliative nature. The patient has no actionable mutations on the recent molecular studies and he is not a candidate for the [facility] clinical trial. He started systemic chemotherapy with carboplatin, Alimta and Keytruda status post 16 cycles.  Starting from cycle #5 the patient is on maintenance treatment with Alimta and Keytruda every 3 weeks. The patient has been treated with single agent Keytruda the last few cycles because of the renal insufficiency. He has been tolerating the treatment with single agent Keytruda fairly well. I recommended for the patient to proceed with cycle #17 today as planned. He will come back for follow-up visit in 3 weeks for evaluation before the next cycle of his treatment. For the insufficiency we will continue to monitor his serum creatinine closely.  He was advised by his cardiologist to resume his treatment with Lasix which may increase his serum creatinine again. The patient was advised to call immediately if he has any other concerning symptoms in the interval.  The patient voices understanding of current disease status and treatment options and is in agreement with the current care plan.  All questions were answered. The patient knows to call the clinic with any problems, questions or concerns. We can certainly see the patient much sooner if necessary.  Disclaimer: This note was dictated with voice recognition software. Similar sounding words can inadvertently be transcribed and may not be corrected upon review.

## 2021-01-01 ENCOUNTER — Telehealth: Payer: Self-pay

## 2021-01-01 ENCOUNTER — Telehealth: Payer: Self-pay | Admitting: Internal Medicine

## 2021-01-01 NOTE — Telephone Encounter (Signed)
Pt called again with this same information and I advised him it was documented yesterday during his call with Shauna Hugh, RN. I also advised the pt we will still need the forms from his employers detailing all the information they will need. Pt states this information is forthcoming and expressed understanding of this information. No further assistance was needed at this time.

## 2021-01-01 NOTE — Telephone Encounter (Signed)
ERR

## 2021-01-01 NOTE — Telephone Encounter (Signed)
Scheduled appts per 2/24 los. Pt to get updated appt calendar at next appt per appt notes.

## 2021-01-04 ENCOUNTER — Other Ambulatory Visit: Payer: Self-pay

## 2021-01-04 ENCOUNTER — Ambulatory Visit: Payer: No Typology Code available for payment source | Admitting: Nurse Practitioner

## 2021-01-04 ENCOUNTER — Ambulatory Visit: Payer: No Typology Code available for payment source | Admitting: Physician Assistant

## 2021-01-08 ENCOUNTER — Ambulatory Visit (INDEPENDENT_AMBULATORY_CARE_PROVIDER_SITE_OTHER): Payer: No Typology Code available for payment source | Admitting: Nurse Practitioner

## 2021-01-08 ENCOUNTER — Ambulatory Visit (INDEPENDENT_AMBULATORY_CARE_PROVIDER_SITE_OTHER): Payer: No Typology Code available for payment source | Admitting: Primary Care

## 2021-01-08 ENCOUNTER — Encounter: Payer: Self-pay | Admitting: Nurse Practitioner

## 2021-01-08 ENCOUNTER — Other Ambulatory Visit: Payer: Self-pay

## 2021-01-08 VITALS — Temp 96.8°F | Ht 69.0 in | Wt 217.5 lb

## 2021-01-08 DIAGNOSIS — R42 Dizziness and giddiness: Secondary | ICD-10-CM

## 2021-01-08 DIAGNOSIS — F528 Other sexual dysfunction not due to a substance or known physiological condition: Secondary | ICD-10-CM | POA: Insufficient documentation

## 2021-01-08 DIAGNOSIS — M25519 Pain in unspecified shoulder: Secondary | ICD-10-CM | POA: Insufficient documentation

## 2021-01-08 DIAGNOSIS — R0989 Other specified symptoms and signs involving the circulatory and respiratory systems: Secondary | ICD-10-CM | POA: Diagnosis not present

## 2021-01-08 DIAGNOSIS — L0390 Cellulitis, unspecified: Secondary | ICD-10-CM | POA: Insufficient documentation

## 2021-01-08 DIAGNOSIS — J069 Acute upper respiratory infection, unspecified: Secondary | ICD-10-CM | POA: Insufficient documentation

## 2021-01-08 DIAGNOSIS — E1122 Type 2 diabetes mellitus with diabetic chronic kidney disease: Secondary | ICD-10-CM

## 2021-01-08 DIAGNOSIS — Z23 Encounter for immunization: Secondary | ICD-10-CM | POA: Insufficient documentation

## 2021-01-08 DIAGNOSIS — D126 Benign neoplasm of colon, unspecified: Secondary | ICD-10-CM | POA: Insufficient documentation

## 2021-01-08 DIAGNOSIS — I10 Essential (primary) hypertension: Secondary | ICD-10-CM | POA: Diagnosis not present

## 2021-01-08 DIAGNOSIS — G4733 Obstructive sleep apnea (adult) (pediatric): Secondary | ICD-10-CM | POA: Insufficient documentation

## 2021-01-08 DIAGNOSIS — E785 Hyperlipidemia, unspecified: Secondary | ICD-10-CM | POA: Insufficient documentation

## 2021-01-08 DIAGNOSIS — D3500 Benign neoplasm of unspecified adrenal gland: Secondary | ICD-10-CM | POA: Insufficient documentation

## 2021-01-08 DIAGNOSIS — I129 Hypertensive chronic kidney disease with stage 1 through stage 4 chronic kidney disease, or unspecified chronic kidney disease: Secondary | ICD-10-CM

## 2021-01-08 DIAGNOSIS — Z7689 Persons encountering health services in other specified circumstances: Secondary | ICD-10-CM

## 2021-01-08 DIAGNOSIS — N200 Calculus of kidney: Secondary | ICD-10-CM | POA: Insufficient documentation

## 2021-01-08 DIAGNOSIS — E1169 Type 2 diabetes mellitus with other specified complication: Secondary | ICD-10-CM

## 2021-01-08 DIAGNOSIS — R69 Illness, unspecified: Secondary | ICD-10-CM | POA: Insufficient documentation

## 2021-01-08 DIAGNOSIS — Z800 Family history of malignant neoplasm of digestive organs: Secondary | ICD-10-CM | POA: Insufficient documentation

## 2021-01-08 DIAGNOSIS — G4730 Sleep apnea, unspecified: Secondary | ICD-10-CM | POA: Insufficient documentation

## 2021-01-08 DIAGNOSIS — L0291 Cutaneous abscess, unspecified: Secondary | ICD-10-CM | POA: Insufficient documentation

## 2021-01-08 DIAGNOSIS — K625 Hemorrhage of anus and rectum: Secondary | ICD-10-CM | POA: Insufficient documentation

## 2021-01-08 DIAGNOSIS — K029 Dental caries, unspecified: Secondary | ICD-10-CM | POA: Insufficient documentation

## 2021-01-08 DIAGNOSIS — M549 Dorsalgia, unspecified: Secondary | ICD-10-CM | POA: Insufficient documentation

## 2021-01-08 DIAGNOSIS — M542 Cervicalgia: Secondary | ICD-10-CM | POA: Insufficient documentation

## 2021-01-08 DIAGNOSIS — IMO0001 Reserved for inherently not codable concepts without codable children: Secondary | ICD-10-CM

## 2021-01-08 DIAGNOSIS — C3490 Malignant neoplasm of unspecified part of unspecified bronchus or lung: Secondary | ICD-10-CM

## 2021-01-08 MED ORDER — CARVEDILOL 3.125 MG PO TABS: 3.1250 mg | ORAL_TABLET | Freq: Two times a day (BID) | ORAL | 0 refills | Status: DC

## 2021-01-08 NOTE — Progress Notes (Signed)
New Patient Office Visit  Subjective:  Patient ID: Kevin Lambert., male    DOB: [birth year]  Age: 65 y.o. MRN: [id number]  CC:  Chief Complaint  Patient presents with  . New Patient (Initial Visit)    HPI Kevin Lambert. presents for intermittent episodes of dizziness. He states that his equilibrium is off when he changes position. He states that this is not happening every time he changes position, but most times. Occurring several times each day. He states that this has been going on for about 6 months. He denies chest pain, chest pressure, or headaches. He denies nasal or sinus congestion.  He is currently a patient at [facility]. He is being treated for lung cancer. He is receiving chemotherapy every three weeks. He is also taking Keytruda. Was on another immunotherapeutic as well and this was stopped recently. He believes it was stopped due to abnormal renal functions.  Reviewed recent labs done at cancer center. Blood sugar was 186. I do not see recent check of HgbA1c. He states that he is followed by a provider at [facility] in [place]. Renal functions were checked at cancer center on 12/31/2020. His creatinine was moderately elevated at 2.21, his BUN was normal at 18, and his GFR was reduced to 32. Will recheck today.   Past Medical History:  Diagnosis Date  . Arthritis   . Chronic kidney disease   . Depression   . DM (diabetes mellitus) ([place])    type II  . Dyslipidemia   . Dyspnea    unable to walk much - he thinks it is from   . GERD (gastroesophageal reflux disease)   . Hepatitis    Hepatitis C- treated  . History of kidney stones   . HTN (hypertension)   . Neuropathy   . nscl ca dx'd 10/2019  . OSA on CPAP   . PTSD (post-traumatic stress disorder)     Past Surgical History:  Procedure Laterality Date  . BIOPSY OF MEDIASTINAL MASS  12/10/2019   Procedure: BIOPSY OF MEDIASTINAL MASS;  Surgeon: Garner Nash, DO;  Location: [facility];  Service:  Pulmonary;;  right upper lobe  . BRONCHIAL NEEDLE ASPIRATION BIOPSY  12/10/2019   Procedure: BRONCHIAL NEEDLE ASPIRATION BIOPSIES;  Surgeon: Garner Nash, DO;  Location: [facility];  Service: Pulmonary;;  . COLONOSCOPY    . IR IMAGING GUIDED PORT INSERTION  12/31/2019  . kidney stone    . URETERAL STENT PLACEMENT    . Ureteral Stent Removed    . VIDEO BRONCHOSCOPY WITH ENDOBRONCHIAL ULTRASOUND N/A 12/10/2019   Procedure: VIDEO BRONCHOSCOPY WITH ENDOBRONCHIAL ULTRASOUND;  Surgeon: Garner Nash, DO;  Location: [facility];  Service: Pulmonary;  Laterality: N/A;    Family History  Problem Relation Age of Onset  . Liver disease Mother   . Diabetes Father     Social History   Socioeconomic History  . Marital status: Single    Spouse name: Not on file  . Number of children: Not on file  . Years of education: Not on file  . Highest education level: Not on file  Occupational History  . Occupation: Pension scheme manager: [id number]  Tobacco Use  . Smoking status: Current Every Day Smoker    Packs/day: 0.60    Years: 50.00    Pack years: 30.00    Types: Cigarettes  . Smokeless tobacco: Never Used  . Tobacco comment: 12/10/20: 1-2 Black & Milds and 4  cigarettes daily  Vaping Use  . Vaping Use: Never used  Substance and Sexual Activity  . Alcohol use: Yes    Alcohol/week: 6.0 standard drinks    Types: 6 Cans of beer per week  . Drug use: Not Currently  . Sexual activity: Yes    Birth control/protection: None  Other Topics Concern  . Not on file  Social History Narrative  . Not on file   Social Determinants of Health   Financial Resource Strain: Not on file  Food Insecurity: Not on file  Transportation Needs: Not on file  Physical Activity: Not on file  Stress: Not on file  Social Connections: Not on file  Intimate Partner Violence: Not on file    ROS Review of Systems  Constitutional: Positive for fatigue.  HENT: Negative for congestion, postnasal drip,  rhinorrhea, sinus pressure and sinus pain.   Cardiovascular: Negative for chest pain and palpitations.  Endocrine:       [facility] [place]   Neurological: Positive for dizziness and weakness.       Intermittent episodes dizziness. Feeling unsteady on his feet.     Objective:   Today's Vitals   01/08/21 0857  Temp: (!) 96.8 F (36 C)  SpO2: 97%  Weight: 217 lb 8 oz (98.7 kg)  Height: 5\' 9"  (1.753 m)   Body mass index is 32.12 kg/m.    Physical Exam Vitals and nursing note reviewed.  Constitutional:      Appearance: Normal appearance. He is well-developed and well-nourished.  HENT:     Head: Normocephalic and atraumatic.     Right Ear: Ear canal and external ear normal.     Left Ear: Ear canal and external ear normal.     Nose: Nose normal.  Eyes:     Pupils: Pupils are equal, round, and reactive to light.  Neck:     Vascular: Carotid bruit present.     Comments: Carotid bruit auscultated over left side of neck.  Cardiovascular:     Rate and Rhythm: Normal rate and regular rhythm.     Pulses: Normal pulses.     Heart sounds: Normal heart sounds.  Pulmonary:     Effort: Pulmonary effort is normal.     Breath sounds: Normal breath sounds.     Comments: Breath sounds are diminished with good air movement throughout the lung fields.  Abdominal:     General: Bowel sounds are normal.     Palpations: Abdomen is soft.     Tenderness: There is no abdominal tenderness.  Musculoskeletal:        General: Normal range of motion.     Cervical back: Normal range of motion and neck supple.  Skin:    General: Skin is warm and dry.  Neurological:     General: No focal deficit present.     Mental Status: He is alert and oriented to person, place, and time.  Psychiatric:        Mood and Affect: Mood and affect and mood normal.        Behavior: Behavior normal.        Thought Content: Thought content normal.        Judgment: Judgment normal.     Orthostatic VS for the past 72 hrs (Last 3 readings):  Orthostatic BP Patient Position Orthostatic Pulse  01/08/21 1000 139/86 Standing 88  01/08/21 0959 (!) 148/97 Sitting 74  01/08/21 0958 (!) 155/91 Supine 70   Assessment &  Plan:  1. Encounter to establish care Patient appointment to establish new primary care provider. He does have care through [facility], but was encouraged to get primary care outside of system. Will get most recent records, progress notes, and imaging studies to review and update patient's chart.   2. Dizziness Positive orthostatic vital signs. Encouraged improved hydration. Advised he change position slowly and carefully. Carotid bruit ordered for further evaluation. Check of labs today.   3. Bruit Bruit auscultated over left side of neck. Will get carotid doppler study for further evaluation.  - US Carotid Duplex Bilateral; Future  4. Primary hypertension Patient states he has never added in previously prescribed coreg of 25mg  twice daily. Reduced dose to 3.125mg  twice daily. Advised him to continue with other blood pressure medications as prescribed. Will have patient monitor his blood pressure closely at home.  - carvedilol (COREG) 3.125 MG tablet; Take 1 tablet (3.125 mg total) by mouth 2 (two) times daily with a meal.  Dispense: 180 tablet; Refill: 0 - Comprehensive metabolic panel  5. 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 Will check HgbA1c and cmp for further evaluaiton.  - Comprehensive metabolic panel - Hemoglobin A1c - Urine Microalbumin w/creat. ratio  6. Malignant neoplasm of lung, unspecified laterality, unspecified part of lung ([place]) Patient to continue with medications and visits to cancer center as before.    Problem List Items Addressed This Visit      Cardiovascular and Mediastinum   HTN  (hypertension)   Relevant Medications   carvedilol (COREG) 3.125 MG tablet   Other Relevant Orders   Comprehensive metabolic panel (Completed)     Respiratory   Malignant neoplasm of lung (HCC)     Endocrine   DM (diabetes mellitus) ([place])     Other   Dizziness   Bruit   Relevant Orders   US Carotid Duplex Bilateral   Encounter to establish care - Primary      Outpatient Encounter Medications as of 01/08/2021  Medication Sig  . albuterol (VENTOLIN HFA) 108 (90 Base) MCG/ACT inhaler Inhale 2 puffs into the lungs every 4 (four) hours as needed for wheezing or shortness of breath.  Marland Kitchen amLODipine (NORVASC) 10 MG tablet Take 10 mg by mouth daily.  . Ascorbic Acid (VITAMIN C) 1000 MG tablet Take 1,000 mg by mouth daily.  Marland Kitchen aspirin EC 81 MG tablet Take 81 mg by mouth daily.  Marland Kitchen atorvastatin (LIPITOR) 10 MG tablet Take 10 mg by mouth daily.  . bisacodyl (DULCOLAX) 5 MG EC tablet Take 5 mg by mouth daily as needed for moderate constipation.  . carvedilol (COREG) 3.125 MG tablet Take 1 tablet (3.125 mg total) by mouth 2 (two) times daily with a meal.  . cholecalciferol (VITAMIN D3) 25 MCG (1000 UNIT) tablet Take 1,000 Units by mouth daily.  . Cyanocobalamin (VITAMIN B 12 PO) Take 1 tablet by mouth daily.  . folic acid (FOLVITE) 1 MG tablet Take 1 tablet (1 mg total) by mouth daily. Pt needs to pick up today and start ASAP . He will pay out of pocket. He is waiting on the [place] to send his folic acid which will be next week.  Marland Kitchen glipiZIDE (GLUCOTROL) 5 MG tablet Take 5 mg by mouth daily before breakfast.   . lidocaine-prilocaine (EMLA) cream Apply to Port-A-Cath site 30-60-minute before treatment.  . magic mouthwash SOLN  Take 5 mLs by mouth 4 (four) times daily as needed for mouth pain. Swish and swallow or spit  . nicotine (NICODERM CQ) 21 mg/24hr patch Place 1 patch (21 mg total) onto the skin daily.  . nicotine polacrilex (COMMIT) 4 MG lozenge Take 1 lozenge (4 mg total) by mouth as needed for  smoking cessation.  . potassium chloride SA (KLOR-CON) 20 MEQ tablet Take 1 tablet (20 mEq total) by mouth daily.  . predniSONE (DELTASONE) 20 MG tablet 5 tablet p.o. daily for 5 days, then 4 tablet p.o. daily for 5 days, then 3 tablet p.o. daily for 5 days, then 2 tablet p.o. daily for 5 days and then 1 tablet p.o. daily for 5 days.  . pregabalin (LYRICA) 25 MG capsule Take 1 capsule (25 mg total) by mouth daily.  Marland Kitchen Propylene Glycol (SYSTANE BALANCE OP) Place 1 drop into both eyes daily.  . sildenafil (VIAGRA) 100 MG tablet TAKE ONE TABLET BY MOUTH AS INSTRUCTED (TAKE 1 HOUR PRIOR TO SEXUAL ACTIVITY *DO NOT EXCEED 1 DOSE PER 24 HOUR PERIOD*)  . triamterene-hydrochlorothiazide (MAXZIDE-25) 37.5-25 MG tablet Take 1 tablet by mouth daily.  Marland Kitchen umeclidinium-vilanterol (ANORO ELLIPTA) 62.5-25 MCG/INH AEPB Inhale 1 puff into the lungs daily.  Marland Kitchen umeclidinium-vilanterol (ANORO ELLIPTA) 62.5-25 MCG/INH AEPB Inhale 1 puff into the lungs daily.  Marland Kitchen zinc gluconate 50 MG tablet Take 50 mg by mouth daily.  . furosemide (LASIX) 20 MG tablet Take 1 tablet (20 mg total) by mouth daily. As needed (Patient not taking: Reported on 01/08/2021)  . [DISCONTINUED] carvedilol (COREG) 25 MG tablet Take 1 tablet (25 mg total) by mouth 2 (two) times daily. (Patient not taking: Reported on 01/08/2021)   No facility-administered encounter medications on file as of 01/08/2021.   Time spent with the patient was approximately 50 minutes. This time included reviewing progress notes, labs, imaging studies, and discussing plan for follow up.    Follow-up: Return in about 6 weeks (around 02/19/2021) for DM, HTN, Dizziness.   [name], NP

## 2021-01-08 NOTE — Patient Instructions (Signed)

## 2021-01-09 LAB — COMPREHENSIVE METABOLIC PANEL
ALT: 46 IU/L — ABNORMAL HIGH (ref 0–44)
AST: 22 IU/L (ref 0–40)
Albumin/Globulin Ratio: 1.9 (ref 1.2–2.2)
Albumin: 4.7 g/dL (ref 3.8–4.8)
Alkaline Phosphatase: 85 IU/L (ref 44–121)
BUN/Creatinine Ratio: 12 (ref 10–24)
BUN: 28 mg/dL — ABNORMAL HIGH (ref 8–27)
Bilirubin Total: 0.2 mg/dL (ref 0.0–1.2)
CO2: 19 mmol/L — ABNORMAL LOW (ref 20–29)
Calcium: 10.6 mg/dL — ABNORMAL HIGH (ref 8.6–10.2)
Chloride: 101 mmol/L (ref 96–106)
Creatinine, Ser: 2.25 mg/dL — ABNORMAL HIGH (ref 0.76–1.27)
Globulin, Total: 2.5 g/dL (ref 1.5–4.5)
Glucose: 117 mg/dL — ABNORMAL HIGH (ref 65–99)
Potassium: 4.4 mmol/L (ref 3.5–5.2)
Sodium: 138 mmol/L (ref 134–144)
Total Protein: 7.2 g/dL (ref 6.0–8.5)
eGFR: 32 mL/min/{1.73_m2} — ABNORMAL LOW (ref >59)

## 2021-01-09 LAB — MICROALBUMIN / CREATININE URINE RATIO
Creatinine, Urine: 112 mg/dL
Microalb/Creat Ratio: 13 mg/g creat (ref 0–29)
Microalbumin, Urine: 14.4 ug/mL

## 2021-01-09 LAB — HEMOGLOBIN A1C
Est. average glucose Bld gHb Est-mCnc: 154 mg/dL
Hgb A1c MFr Bld: 7 % — ABNORMAL HIGH (ref 4.8–5.6)

## 2021-01-10 DIAGNOSIS — R42 Dizziness and giddiness: Secondary | ICD-10-CM | POA: Insufficient documentation

## 2021-01-10 DIAGNOSIS — R0989 Other specified symptoms and signs involving the circulatory and respiratory systems: Secondary | ICD-10-CM | POA: Insufficient documentation

## 2021-01-10 DIAGNOSIS — Z7689 Persons encountering health services in other specified circumstances: Secondary | ICD-10-CM | POA: Insufficient documentation

## 2021-01-10 DIAGNOSIS — C3490 Malignant neoplasm of unspecified part of unspecified bronchus or lung: Secondary | ICD-10-CM | POA: Insufficient documentation

## 2021-01-11 NOTE — Progress Notes (Signed)
Renal functions elevated. Suspected due to immunotherapy given per cancer center. Will have rechecked on 01/21/2021. Monitor calcium levels. Mildly elevated today, but normal on recent checks.

## 2021-01-14 ENCOUNTER — Telehealth: Payer: Self-pay | Admitting: *Deleted

## 2021-01-14 NOTE — Telephone Encounter (Signed)
"  Kevin Lambert. [phone]).  Trying to return to work.  Have you received the [employer] return to work form faxed this morning.  Would like to return to work February 05, 2021.  Form needs to be returned by 01/20/2021 but I do not see Dr. Julien Nordmann until 01/21/2021.  Do not have access to fax number used."  No form yet received.  Provided fax [phone], e-mail: [email or web address].  Asked to allow up to 14 calendar days to process form.  "I will ask for extension."

## 2021-01-20 ENCOUNTER — Ambulatory Visit
Admission: RE | Admit: 2021-01-20 | Discharge: 2021-01-20 | Disposition: A | Discharge status: Home / Self Care | Payer: Medicare (Managed Care) | Source: Ambulatory Visit | Attending: Nurse Practitioner | Admitting: Nurse Practitioner

## 2021-01-20 DIAGNOSIS — R0989 Other specified symptoms and signs involving the circulatory and respiratory systems: Secondary | ICD-10-CM

## 2021-01-21 ENCOUNTER — Inpatient Hospital Stay: Payer: No Typology Code available for payment source

## 2021-01-21 ENCOUNTER — Inpatient Hospital Stay: Payer: No Typology Code available for payment source | Attending: Internal Medicine | Admitting: Internal Medicine

## 2021-01-21 ENCOUNTER — Other Ambulatory Visit: Payer: Self-pay

## 2021-01-21 VITALS — BP 144/72 | HR 82 | Temp 97.5°F | Resp 17 | Ht 69.0 in | Wt 222.5 lb

## 2021-01-21 DIAGNOSIS — I129 Hypertensive chronic kidney disease with stage 1 through stage 4 chronic kidney disease, or unspecified chronic kidney disease: Secondary | ICD-10-CM | POA: Diagnosis not present

## 2021-01-21 DIAGNOSIS — Z79899 Other long term (current) drug therapy: Secondary | ICD-10-CM | POA: Diagnosis not present

## 2021-01-21 DIAGNOSIS — N189 Chronic kidney disease, unspecified: Secondary | ICD-10-CM | POA: Insufficient documentation

## 2021-01-21 DIAGNOSIS — C3411 Malignant neoplasm of upper lobe, right bronchus or lung: Secondary | ICD-10-CM | POA: Diagnosis present

## 2021-01-21 DIAGNOSIS — C3491 Malignant neoplasm of unspecified part of right bronchus or lung: Secondary | ICD-10-CM | POA: Diagnosis not present

## 2021-01-21 DIAGNOSIS — E1122 Type 2 diabetes mellitus with diabetic chronic kidney disease: Secondary | ICD-10-CM | POA: Diagnosis not present

## 2021-01-21 DIAGNOSIS — C3490 Malignant neoplasm of unspecified part of unspecified bronchus or lung: Secondary | ICD-10-CM

## 2021-01-21 DIAGNOSIS — Z5112 Encounter for antineoplastic immunotherapy: Secondary | ICD-10-CM | POA: Insufficient documentation

## 2021-01-21 LAB — CMP (CANCER CENTER ONLY)
ALT: 26 U/L (ref 0–44)
AST: 17 U/L (ref 15–41)
Albumin: 3.4 g/dL — ABNORMAL LOW (ref 3.5–5.0)
Alkaline Phosphatase: 74 U/L (ref 38–126)
Anion gap: 7 (ref 5–15)
BUN: 32 mg/dL — ABNORMAL HIGH (ref 8–23)
CO2: 25 mmol/L (ref 22–32)
Calcium: 9.5 mg/dL (ref 8.9–10.3)
Chloride: 102 mmol/L (ref 98–111)
Creatinine: 1.83 mg/dL — ABNORMAL HIGH (ref 0.61–1.24)
GFR, Estimated: 40 mL/min — ABNORMAL LOW (ref >60)
Glucose, Bld: 158 mg/dL — ABNORMAL HIGH (ref 70–99)
Potassium: 3.9 mmol/L (ref 3.5–5.1)
Sodium: 134 mmol/L — ABNORMAL LOW (ref 135–145)
Total Bilirubin: 0.3 mg/dL (ref 0.3–1.2)
Total Protein: 6.6 g/dL (ref 6.5–8.1)

## 2021-01-21 LAB — CBC WITH DIFFERENTIAL (CANCER CENTER ONLY)
Abs Immature Granulocytes: 0.04 10*3/uL (ref 0.00–0.07)
Basophils Absolute: 0 10*3/uL (ref 0.0–0.1)
Basophils Relative: 0 %
Eosinophils Absolute: 0.3 10*3/uL (ref 0.0–0.5)
Eosinophils Relative: 5 %
HCT: 34 % — ABNORMAL LOW (ref 39.0–52.0)
Hemoglobin: 11.9 g/dL — ABNORMAL LOW (ref 13.0–17.0)
Immature Granulocytes: 1 %
Lymphocytes Relative: 34 %
Lymphs Abs: 2.4 10*3/uL (ref 0.7–4.0)
MCH: 31.6 pg (ref 26.0–34.0)
MCHC: 35 g/dL (ref 30.0–36.0)
MCV: 90.2 fL (ref 80.0–100.0)
Monocytes Absolute: 0.7 10*3/uL (ref 0.1–1.0)
Monocytes Relative: 10 %
Neutro Abs: 3.5 10*3/uL (ref 1.7–7.7)
Neutrophils Relative %: 50 %
Platelet Count: 182 10*3/uL (ref 150–400)
RBC: 3.77 MIL/uL — ABNORMAL LOW (ref 4.22–5.81)
RDW: 14.6 % (ref 11.5–15.5)
WBC Count: 7 10*3/uL (ref 4.0–10.5)
nRBC: 0 % (ref 0.0–0.2)

## 2021-01-21 LAB — TSH: TSH: 0.916 u[IU]/mL (ref 0.320–4.118)

## 2021-01-21 MED ORDER — HEPARIN SOD (PORK) LOCK FLUSH 100 UNIT/ML IV SOLN
500.0000 [IU] | Freq: Once | INTRAVENOUS | Status: AC | PRN
  Administered 2021-01-21: 500 [IU]
  Filled 2021-01-21: qty 5

## 2021-01-21 MED ORDER — SODIUM CHLORIDE 0.9 % IV SOLN
Freq: Once | INTRAVENOUS | Status: AC
  Filled 2021-01-21: qty 250

## 2021-01-21 MED ORDER — SODIUM CHLORIDE 0.9% FLUSH
10.0000 mL | INTRAVENOUS | Status: DC | PRN
  Administered 2021-01-21: 10 mL
  Filled 2021-01-21: qty 10

## 2021-01-21 MED ORDER — PROCHLORPERAZINE MALEATE 10 MG PO TABS
ORAL_TABLET | ORAL | Status: AC
  Filled 2021-01-21: qty 1

## 2021-01-21 MED ORDER — SODIUM CHLORIDE 0.9 % IV SOLN
200.0000 mg | Freq: Once | INTRAVENOUS | Status: AC
  Administered 2021-01-21: 200 mg via INTRAVENOUS
  Filled 2021-01-21: qty 8

## 2021-01-21 MED ORDER — PROCHLORPERAZINE MALEATE 10 MG PO TABS
10.0000 mg | ORAL_TABLET | Freq: Once | ORAL | Status: AC
  Administered 2021-01-21: 10 mg via ORAL

## 2021-01-21 NOTE — Progress Notes (Signed)
[facility] [phone]   Fax:(336) [phone]  OFFICE PROGRESS NOTE  [name], NP [place] [address]  DIAGNOSIS: Stage IV (T2b, N3, M1a) non-small cell lung cancer, adenocarcinoma presented with large right upper lobe lung mass in addition to mediastinal and right supraclavicular lymphadenopathy as well as bilateral pulmonary nodules diagnosed in February 2021.  MOLECULAR STUDY by [employer]:  KRASG12C, 4.3%, Binimetinib  [id number], 0.9%, Niraparib, Olaparib, Rucaparib,Talazoparib, Tazemetostat  [id number], 1.7%, None   PRIOR THERAPY: None  CURRENT THERAPY: Systemic chemotherapy with carboplatin for AUC of 5, Alimta 500 mg/M2 and Keytruda 200 mg IV every 3 weeks.  First dose December 31, 2019.  Status post 17 cycles.  The last few cycles he has been treated with single agent Keytruda secondary to renal insufficiency.  INTERVAL HISTORY: Kevin Lambert. 65 y.o. male returns to the clinic today for follow-up visit.  The patient is feeling fine today with no concerning complaints.  The swelling of the upper and lower extremity has significantly improved.  He denied having any current chest pain but continues to have shortness of breath with exertion with no cough or hemoptysis.  He denied having any fever or chills.  He has no nausea, vomiting, diarrhea or constipation.  He has no headache or visual changes.  He lost a lot of weight with diuresis and also cutting on his diet.  The patient is here today for evaluation before starting cycle #18 of his treatment.   MEDICAL HISTORY: Past Medical History:  Diagnosis Date  . Arthritis   . Chronic kidney disease   . Depression   . DM (diabetes mellitus) ([facility])    type II  . Dyslipidemia   . Dyspnea    unable to walk much - he thinks it is from   . GERD (gastroesophageal reflux disease)   . Hepatitis    Hepatitis C- treated  . History of kidney stones   . HTN  (hypertension)   . Neuropathy   . nscl ca dx'd 10/2019  . OSA on CPAP   . PTSD (post-traumatic stress disorder)     ALLERGIES:  is allergic to metformin and related, naproxen, and oxycontin [oxycodone].  MEDICATIONS:  Current Outpatient Medications  Medication Sig Dispense Refill  . albuterol (VENTOLIN HFA) 108 (90 Base) MCG/ACT inhaler Inhale 2 puffs into the lungs every 4 (four) hours as needed for wheezing or shortness of breath. 18 g 0  . amLODipine (NORVASC) 10 MG tablet Take 10 mg by mouth daily.    . Ascorbic Acid (VITAMIN C) 1000 MG tablet Take 1,000 mg by mouth daily.    Marland Kitchen aspirin EC 81 MG tablet Take 81 mg by mouth daily.    Marland Kitchen atorvastatin (LIPITOR) 10 MG tablet Take 10 mg by mouth daily.    . bisacodyl (DULCOLAX) 5 MG EC tablet Take 5 mg by mouth daily as needed for moderate constipation.    . carvedilol (COREG) 3.125 MG tablet Take 1 tablet (3.125 mg total) by mouth 2 (two) times daily with a meal. 180 tablet 0  . cholecalciferol (VITAMIN D3) 25 MCG (1000 UNIT) tablet Take 1,000 Units by mouth daily.    . Cyanocobalamin (VITAMIN B 12 PO) Take 1 tablet by mouth daily.    . folic acid (FOLVITE) 1 MG tablet Take 1 tablet (1 mg total) by mouth daily. Pt needs to pick up today and start ASAP . He will pay out  of pocket. He is waiting on the [place] to send his folic acid which will be next week. 7 tablet 0  . furosemide (LASIX) 20 MG tablet Take 1 tablet (20 mg total) by mouth daily. As needed (Patient not taking: Reported on 01/08/2021) 20 tablet 0  . glipiZIDE (GLUCOTROL) 5 MG tablet Take 5 mg by mouth daily before breakfast.     . lidocaine-prilocaine (EMLA) cream Apply to Port-A-Cath site 30-60-minute before treatment. 30 g 0  . magic mouthwash SOLN Take 5 mLs by mouth 4 (four) times daily as needed for mouth pain. Swish and swallow or spit 240 mL 2  . nicotine (NICODERM CQ) 21 mg/24hr patch Place 1 patch (21 mg total) onto the skin daily. 28 patch 3  . nicotine polacrilex (COMMIT) 4  MG lozenge Take 1 lozenge (4 mg total) by mouth as needed for smoking cessation. 108 tablet 3  . potassium chloride SA (KLOR-CON) 20 MEQ tablet Take 1 tablet (20 mEq total) by mouth daily. 10 tablet 0  . predniSONE (DELTASONE) 20 MG tablet 5 tablet p.o. daily for 5 days, then 4 tablet p.o. daily for 5 days, then 3 tablet p.o. daily for 5 days, then 2 tablet p.o. daily for 5 days and then 1 tablet p.o. daily for 5 days. 75 tablet 0  . pregabalin (LYRICA) 25 MG capsule Take 1 capsule (25 mg total) by mouth daily. 30 capsule 1  . Propylene Glycol (SYSTANE BALANCE OP) Place 1 drop into both eyes daily.    . sildenafil (VIAGRA) 100 MG tablet TAKE ONE TABLET BY MOUTH AS INSTRUCTED (TAKE 1 HOUR PRIOR TO SEXUAL ACTIVITY *DO NOT EXCEED 1 DOSE PER 24 HOUR PERIOD*)    . triamterene-hydrochlorothiazide (MAXZIDE-25) 37.5-25 MG tablet Take 1 tablet by mouth daily.    Marland Kitchen umeclidinium-vilanterol (ANORO ELLIPTA) 62.5-25 MCG/INH AEPB Inhale 1 puff into the lungs daily. 60 each 11  . umeclidinium-vilanterol (ANORO ELLIPTA) 62.5-25 MCG/INH AEPB Inhale 1 puff into the lungs daily. 14 each 0  . zinc gluconate 50 MG tablet Take 50 mg by mouth daily.     No current facility-administered medications for this visit.    SURGICAL HISTORY:  Past Surgical History:  Procedure Laterality Date  . BIOPSY OF MEDIASTINAL MASS  12/10/2019   Procedure: BIOPSY OF MEDIASTINAL MASS;  Surgeon: Garner Nash, DO;  Location: [facility];  Service: Pulmonary;;  right upper lobe  . BRONCHIAL NEEDLE ASPIRATION BIOPSY  12/10/2019   Procedure: BRONCHIAL NEEDLE ASPIRATION BIOPSIES;  Surgeon: Garner Nash, DO;  Location: [facility];  Service: Pulmonary;;  . COLONOSCOPY    . IR IMAGING GUIDED PORT INSERTION  12/31/2019  . kidney stone    . URETERAL STENT PLACEMENT    . Ureteral Stent Removed    . VIDEO BRONCHOSCOPY WITH ENDOBRONCHIAL ULTRASOUND N/A 12/10/2019   Procedure: VIDEO BRONCHOSCOPY WITH ENDOBRONCHIAL ULTRASOUND;  Surgeon: Garner Nash, DO;  Location: [facility];  Service: Pulmonary;  Laterality: N/A;    REVIEW OF SYSTEMS:  A comprehensive review of systems was negative except for: Respiratory: positive for dyspnea on exertion   PHYSICAL EXAMINATION: General appearance: alert, cooperative and no distress Head: Normocephalic, without obvious abnormality, atraumatic Neck: no adenopathy, no JVD, supple, symmetrical, trachea midline and thyroid not enlarged, symmetric, no tenderness/mass/nodules Lymph nodes: Cervical, supraclavicular, and axillary nodes normal. Resp: clear to auscultation bilaterally Back: symmetric, no curvature. ROM normal. No CVA tenderness. Cardio: regular rate and rhythm, S1, S2 normal, no murmur, click, rub or gallop GI:  soft, non-tender; bowel sounds normal; no masses,  no organomegaly Extremities: extremities normal, atraumatic, no cyanosis or edema  ECOG PERFORMANCE STATUS: 1 - Symptomatic but completely ambulatory  Blood pressure (!) 144/72, pulse 82, temperature (!) 97.5 F (36.4 C), temperature source Tympanic, resp. rate 17, height 5\' 9"  (1.753 m), weight 222 lb 8 oz (100.9 kg), SpO2 99 %.  LABORATORY DATA: Lab Results  Component Value Date   WBC 7.0 01/21/2021   HGB 11.9 (L) 01/21/2021   HCT 34.0 (L) 01/21/2021   MCV 90.2 01/21/2021   PLT 182 01/21/2021      Chemistry      Component Value Date/Time   NA 138 01/08/2021 0955   K 4.4 01/08/2021 0955   CL 101 01/08/2021 0955   CO2 19 (L) 01/08/2021 0955   BUN 28 (H) 01/08/2021 0955   CREATININE 2.25 (H) 01/08/2021 0955   CREATININE 2.21 (H) 12/31/2020 1002      Component Value Date/Time   CALCIUM 10.6 (H) 01/08/2021 0955   ALKPHOS 85 01/08/2021 0955   AST 22 01/08/2021 0955   AST 17 12/31/2020 1002   ALT 46 (H) 01/08/2021 0955   ALT 26 12/31/2020 1002   BILITOT 0.2 01/08/2021 0955   BILITOT 0.3 12/31/2020 1002       RADIOGRAPHIC STUDIES: No results found.  ASSESSMENT AND PLAN: This is a very pleasant 65  years old African-American male recently diagnosed with a stage IV non-small cell lung cancer, adenocarcinoma with no actionable mutations presented with large right lower lobe lung mass in addition to mediastinal and right supraclavicular lymphadenopathy as well as bilateral pulmonary nodules diagnosed in February 2021. I explained to the patient that he has incurable condition and all the treatment options will be of palliative nature. The patient has no actionable mutations on the recent molecular studies and he is not a candidate for the [facility] clinical trial. He started systemic chemotherapy with carboplatin, Alimta and Keytruda status post 17 cycles.  Starting from cycle #5 the patient is on maintenance treatment with Alimta and Keytruda every 3 weeks. The patient has been treated with single agent Keytruda the last few cycles because of the renal insufficiency. He has been tolerating this treatment well with no concerning adverse effects. I recommended for him to proceed with cycle #18 today as planned. I will see the patient back for follow-up visit in 3 weeks for evaluation with repeat CT scan of the chest, abdomen pelvis for restaging of his disease. For the insufficiency we will continue to monitor his serum creatinine closely.  He was advised by his cardiologist to resume his treatment with Lasix which may increase his serum creatinine again. The patient was advised to call immediately if he has any concerning symptoms in the interval. The patient voices understanding of current disease status and treatment options and is in agreement with the current care plan.  All questions were answered. The patient knows to call the clinic with any problems, questions or concerns. We can certainly see the patient much sooner if necessary.  Disclaimer: This note was dictated with voice recognition software. Similar sounding words can inadvertently be transcribed and may not be corrected upon  review.

## 2021-01-21 NOTE — Progress Notes (Signed)
Per Dr Julien Nordmann, ok to treat with creatinine 1.83

## 2021-01-21 NOTE — Progress Notes (Signed)
Please let the patient know that the ultrasound, done on his carotid arteries, looks good. There is very little plaque present. We can talk about results in depth at his next visit. Thanks.

## 2021-01-21 NOTE — Patient Instructions (Addendum)
[facility] Discharge Instructions for Patients Receiving Chemotherapy  Today you received the following chemotherapy agents: pembrolizumab.  To help prevent nausea and vomiting after your treatment, we encourage you to take your nausea medication as directed.   If you develop nausea and vomiting that is not controlled by your nausea medication, call the clinic.   BELOW ARE SYMPTOMS THAT SHOULD BE REPORTED IMMEDIATELY:  *FEVER GREATER THAN 100.5 F  *CHILLS WITH OR WITHOUT FEVER  NAUSEA AND VOMITING THAT IS NOT CONTROLLED WITH YOUR NAUSEA MEDICATION  *UNUSUAL SHORTNESS OF BREATH  *UNUSUAL BRUISING OR BLEEDING  TENDERNESS IN MOUTH AND THROAT WITH OR WITHOUT PRESENCE OF ULCERS  *URINARY PROBLEMS  *BOWEL PROBLEMS  UNUSUAL RASH Items with * indicate a potential emergency and should be followed up as soon as possible.  Feel free to call the clinic should you have any questions or concerns. The clinic phone number is [phone].  Please show the CHEMO ALERT CARD at check-in to the Emergency Department and triage nurse.   

## 2021-02-05 ENCOUNTER — Telehealth: Payer: Self-pay

## 2021-02-05 NOTE — Telephone Encounter (Signed)
Pt has not been scheduled for his CT scan yet. I have spoken with the pt and provided the number to radiology scheduling. He expressed understanding of this information and states he will call to schedule it.

## 2021-02-09 ENCOUNTER — Ambulatory Visit (HOSPITAL_COMMUNITY): Payer: No Typology Code available for payment source

## 2021-02-11 ENCOUNTER — Other Ambulatory Visit: Payer: Self-pay

## 2021-02-11 ENCOUNTER — Encounter: Payer: Self-pay | Admitting: Internal Medicine

## 2021-02-11 ENCOUNTER — Inpatient Hospital Stay (HOSPITAL_BASED_OUTPATIENT_CLINIC_OR_DEPARTMENT_OTHER): Payer: No Typology Code available for payment source | Admitting: Internal Medicine

## 2021-02-11 ENCOUNTER — Inpatient Hospital Stay: Payer: No Typology Code available for payment source

## 2021-02-11 ENCOUNTER — Inpatient Hospital Stay: Payer: No Typology Code available for payment source | Attending: Internal Medicine

## 2021-02-11 VITALS — BP 171/83 | HR 75 | Temp 96.4°F | Resp 19 | Ht 69.0 in | Wt 221.3 lb

## 2021-02-11 VITALS — BP 157/85

## 2021-02-11 DIAGNOSIS — C3491 Malignant neoplasm of unspecified part of right bronchus or lung: Secondary | ICD-10-CM

## 2021-02-11 DIAGNOSIS — C3411 Malignant neoplasm of upper lobe, right bronchus or lung: Secondary | ICD-10-CM | POA: Insufficient documentation

## 2021-02-11 DIAGNOSIS — Z5112 Encounter for antineoplastic immunotherapy: Secondary | ICD-10-CM | POA: Insufficient documentation

## 2021-02-11 DIAGNOSIS — Z79899 Other long term (current) drug therapy: Secondary | ICD-10-CM | POA: Diagnosis not present

## 2021-02-11 DIAGNOSIS — I10 Essential (primary) hypertension: Secondary | ICD-10-CM | POA: Diagnosis not present

## 2021-02-11 DIAGNOSIS — Z95828 Presence of other vascular implants and grafts: Secondary | ICD-10-CM

## 2021-02-11 LAB — CMP (CANCER CENTER ONLY)
ALT: 29 U/L (ref 0–44)
AST: 16 U/L (ref 15–41)
Albumin: 3.7 g/dL (ref 3.5–5.0)
Alkaline Phosphatase: 66 U/L (ref 38–126)
Anion gap: 12 (ref 5–15)
BUN: 33 mg/dL — ABNORMAL HIGH (ref 8–23)
CO2: 22 mmol/L (ref 22–32)
Calcium: 10.1 mg/dL (ref 8.9–10.3)
Chloride: 104 mmol/L (ref 98–111)
Creatinine: 1.99 mg/dL — ABNORMAL HIGH (ref 0.61–1.24)
GFR, Estimated: 37 mL/min — ABNORMAL LOW (ref >60)
Glucose, Bld: 117 mg/dL — ABNORMAL HIGH (ref 70–99)
Potassium: 4.3 mmol/L (ref 3.5–5.1)
Sodium: 138 mmol/L (ref 135–145)
Total Bilirubin: 0.3 mg/dL (ref 0.3–1.2)
Total Protein: 6.9 g/dL (ref 6.5–8.1)

## 2021-02-11 LAB — CBC WITH DIFFERENTIAL (CANCER CENTER ONLY)
Abs Immature Granulocytes: 0.05 10*3/uL (ref 0.00–0.07)
Basophils Absolute: 0 10*3/uL (ref 0.0–0.1)
Basophils Relative: 1 %
Eosinophils Absolute: 0.3 10*3/uL (ref 0.0–0.5)
Eosinophils Relative: 5 %
HCT: 36.7 % — ABNORMAL LOW (ref 39.0–52.0)
Hemoglobin: 12.9 g/dL — ABNORMAL LOW (ref 13.0–17.0)
Immature Granulocytes: 1 %
Lymphocytes Relative: 41 %
Lymphs Abs: 2.4 10*3/uL (ref 0.7–4.0)
MCH: 32.1 pg (ref 26.0–34.0)
MCHC: 35.1 g/dL (ref 30.0–36.0)
MCV: 91.3 fL (ref 80.0–100.0)
Monocytes Absolute: 0.6 10*3/uL (ref 0.1–1.0)
Monocytes Relative: 10 %
Neutro Abs: 2.5 10*3/uL (ref 1.7–7.7)
Neutrophils Relative %: 42 %
Platelet Count: 204 10*3/uL (ref 150–400)
RBC: 4.02 MIL/uL — ABNORMAL LOW (ref 4.22–5.81)
RDW: 14.6 % (ref 11.5–15.5)
WBC Count: 5.9 10*3/uL (ref 4.0–10.5)
nRBC: 0 % (ref 0.0–0.2)

## 2021-02-11 LAB — TSH: TSH: 1.104 u[IU]/mL (ref 0.320–4.118)

## 2021-02-11 MED ORDER — SODIUM CHLORIDE 0.9 % IV SOLN
200.0000 mg | Freq: Once | INTRAVENOUS | Status: AC
  Administered 2021-02-11: 200 mg via INTRAVENOUS
  Filled 2021-02-11: qty 8

## 2021-02-11 MED ORDER — HEPARIN SOD (PORK) LOCK FLUSH 100 UNIT/ML IV SOLN
500.0000 [IU] | Freq: Once | INTRAVENOUS | Status: AC | PRN
  Administered 2021-02-11: 500 [IU]
  Filled 2021-02-11: qty 5

## 2021-02-11 MED ORDER — SODIUM CHLORIDE 0.9% FLUSH
10.0000 mL | Freq: Once | INTRAVENOUS | Status: AC
  Administered 2021-02-11: 10 mL
  Filled 2021-02-11: qty 10

## 2021-02-11 MED ORDER — SODIUM CHLORIDE 0.9% FLUSH
10.0000 mL | INTRAVENOUS | Status: DC | PRN
  Administered 2021-02-11: 10 mL
  Filled 2021-02-11: qty 10

## 2021-02-11 MED ORDER — SODIUM CHLORIDE 0.9 % IV SOLN
Freq: Once | INTRAVENOUS | Status: AC
  Filled 2021-02-11: qty 250

## 2021-02-11 NOTE — Patient Instructions (Signed)
Implanted Port Insertion, Care After This sheet gives you information about how to care for yourself after your procedure. Your health care provider may also give you more specific instructions. If you have problems or questions, contact your health care provider. What can I expect after the procedure? After the procedure, it is common to have:  Discomfort at the port insertion site.  Bruising on the skin over the port. This should improve over 3-4 days. Follow these instructions at home: Port care  After your port is placed, you will get a manufacturer's information card. The card has information about your port. Keep this card with you at all times.  Take care of the port as told by your health care provider. Ask your health care provider if you or a family member can get training for taking care of the port at home. A home health care nurse may also take care of the port.  Make sure to remember what type of port you have. Incision care  Follow instructions from your health care provider about how to take care of your port insertion site. Make sure you: ? Wash your hands with soap and water before and after you change your bandage (dressing). If soap and water are not available, use hand sanitizer. ? Change your dressing as told by your health care provider. ? Leave stitches (sutures), skin glue, or adhesive strips in place. These skin closures may need to stay in place for 2 weeks or longer. If adhesive strip edges start to loosen and curl up, you may trim the loose edges. Do not remove adhesive strips completely unless your health care provider tells you to do that.  Check your port insertion site every day for signs of infection. Check for: ? Redness, swelling, or pain. ? Fluid or blood. ? Warmth. ? Pus or a bad smell.      Activity  Return to your normal activities as told by your health care provider. Ask your health care provider what activities are safe for you.  Do not  lift anything that is heavier than 10 lb (4.5 kg), or the limit that you are told, until your health care provider says that it is safe. General instructions  Take over-the-counter and prescription medicines only as told by your health care provider.  Do not take baths, swim, or use a hot tub until your health care provider approves. Ask your health care provider if you may take showers. You may only be allowed to take sponge baths.  Do not drive for 24 hours if you were given a sedative during your procedure.  Wear a medical alert bracelet in case of an emergency. This will tell any health care providers that you have a port.  Keep all follow-up visits as told by your health care provider. This is important. Contact a health care provider if:  You cannot flush your port with saline as directed, or you cannot draw blood from the port.  You have a fever or chills.  You have redness, swelling, or pain around your port insertion site.  You have fluid or blood coming from your port insertion site.  Your port insertion site feels warm to the touch.  You have pus or a bad smell coming from the port insertion site. Get help right away if:  You have chest pain or shortness of breath.  You have bleeding from your port that you cannot control. Summary  Take care of the port as told by your   health care provider. Keep the manufacturer's information card with you at all times.  Change your dressing as told by your health care provider.  Contact a health care provider if you have a fever or chills or if you have redness, swelling, or pain around your port insertion site.  Keep all follow-up visits as told by your health care provider. This information is not intended to replace advice given to you by your health care provider. Make sure you discuss any questions you have with your health care provider. Document Revised: 05/22/2018 Document Reviewed: 05/22/2018 Elsevier Patient Education   2021 [employer]vier Inc.  

## 2021-02-11 NOTE — Progress Notes (Signed)
[facility] [phone]   Fax:([phone]  OFFICE PROGRESS NOTE  [name], NP [place] [address]  DIAGNOSIS: Stage IV (T2b, N3, M1a) non-small cell lung cancer, adenocarcinoma presented with large right upper lobe lung mass in addition to mediastinal and right supraclavicular lymphadenopathy as well as bilateral pulmonary nodules diagnosed in February 2021.  MOLECULAR STUDY by [employer]:  KRASG12C, 4.3%, Binimetinib  [id number], 0.9%, Niraparib, Olaparib, Rucaparib,Talazoparib, Tazemetostat  [id number], 1.7%, None   PRIOR THERAPY: None  CURRENT THERAPY: Systemic chemotherapy with carboplatin for AUC of 5, Alimta 500 mg/M2 and Keytruda 200 mg IV every 3 weeks.  First dose December 31, 2019.  Status post 18 cycles.  The last few cycles he has been treated with single agent Keytruda secondary to renal insufficiency.  INTERVAL HISTORY: Kevin Lambert. 65 y.o. male returns to the clinic today for follow-up visit.  The patient is feeling fine today with no concerning complaints.  He continues to tolerate his treatment with Keytruda fairly well.  He did not take his blood pressure medication earlier today and his blood pressure is high.  He denied having any current chest pain, shortness of breath, cough or hemoptysis.  He denied having any fever or chills.  He has no nausea, vomiting, diarrhea or constipation.  He has no weight loss or night sweats.  He is here today for evaluation before starting cycle #19 of his treatment.   MEDICAL HISTORY: Past Medical History:  Diagnosis Date  . Arthritis   . Chronic kidney disease   . Depression   . DM (diabetes mellitus) ([facility])    type II  . Dyslipidemia   . Dyspnea    unable to walk much - he thinks it is from   . GERD (gastroesophageal reflux disease)   . Hepatitis    Hepatitis C- treated  . History of kidney stones   . HTN (hypertension)   . Neuropathy   . nscl  ca dx'd 10/2019  . OSA on CPAP   . PTSD (post-traumatic stress disorder)     ALLERGIES:  is allergic to metformin and related, naproxen, and oxycontin [oxycodone].  MEDICATIONS:  Current Outpatient Medications  Medication Sig Dispense Refill  . albuterol (VENTOLIN HFA) 108 (90 Base) MCG/ACT inhaler Inhale 2 puffs into the lungs every 4 (four) hours as needed for wheezing or shortness of breath. 18 g 0  . amLODipine (NORVASC) 10 MG tablet Take 10 mg by mouth daily.    . Ascorbic Acid (VITAMIN C) 1000 MG tablet Take 1,000 mg by mouth daily.    Marland Kitchen aspirin EC 81 MG tablet Take 81 mg by mouth daily.    Marland Kitchen atorvastatin (LIPITOR) 10 MG tablet Take 10 mg by mouth daily.    . bisacodyl (DULCOLAX) 5 MG EC tablet Take 5 mg by mouth daily as needed for moderate constipation.    . carvedilol (COREG) 3.125 MG tablet Take 1 tablet (3.125 mg total) by mouth 2 (two) times daily with a meal. 180 tablet 0  . cholecalciferol (VITAMIN D3) 25 MCG (1000 UNIT) tablet Take 1,000 Units by mouth daily.    . Cyanocobalamin (VITAMIN B 12 PO) Take 1 tablet by mouth daily.    . folic acid (FOLVITE) 1 MG tablet Take 1 tablet (1 mg total) by mouth daily. Pt needs to pick up today and start ASAP . He will pay out of pocket. [facility] which will be next week. 7 tablet 0  . furosemide (LASIX) 20 MG tablet Take 1 tablet (20 mg total) by mouth daily. As needed (Patient not taking: Reported on 01/08/2021) 20 tablet 0  . glipiZIDE (GLUCOTROL) 5 MG tablet Take 5 mg by mouth daily before breakfast.     . lidocaine-prilocaine (EMLA) cream Apply to Port-A-Cath site 30-60-minute before treatment. 30 g 0  . magic mouthwash SOLN Take 5 mLs by mouth 4 (four) times daily as needed for mouth pain. Swish and swallow or spit 240 mL 2  . nicotine (NICODERM CQ) 21 mg/24hr patch Place 1 patch (21 mg total) onto the skin daily. 28 patch 3  . nicotine polacrilex (COMMIT) 4 MG lozenge Take 1 lozenge (4 mg total) by  mouth as needed for smoking cessation. 108 tablet 3  . potassium chloride SA (KLOR-CON) 20 MEQ tablet Take 1 tablet (20 mEq total) by mouth daily. 10 tablet 0  . pregabalin (LYRICA) 25 MG capsule Take 1 capsule (25 mg total) by mouth daily. 30 capsule 1  . Propylene Glycol (SYSTANE BALANCE OP) Place 1 drop into both eyes daily.    . sildenafil (VIAGRA) 100 MG tablet TAKE ONE TABLET BY MOUTH AS INSTRUCTED (TAKE 1 HOUR PRIOR TO SEXUAL ACTIVITY *DO NOT EXCEED 1 DOSE PER 24 HOUR PERIOD*)    . triamterene-hydrochlorothiazide (MAXZIDE-25) 37.5-25 MG tablet Take 1 tablet by mouth daily.    Marland Kitchen umeclidinium-vilanterol (ANORO ELLIPTA) 62.5-25 MCG/INH AEPB Inhale 1 puff into the lungs daily. 60 each 11  . umeclidinium-vilanterol (ANORO ELLIPTA) 62.5-25 MCG/INH AEPB Inhale 1 puff into the lungs daily. 14 each 0  . zinc gluconate 50 MG tablet Take 50 mg by mouth daily.     No current facility-administered medications for this visit.    SURGICAL HISTORY:  Past Surgical History:  Procedure Laterality Date  . BIOPSY OF MEDIASTINAL MASS  12/10/2019   Procedure: BIOPSY OF MEDIASTINAL MASS;  Surgeon: Garner Nash, DO;  Location: [facility];  Service: Pulmonary;;  right upper lobe  . BRONCHIAL NEEDLE ASPIRATION BIOPSY  12/10/2019   Procedure: BRONCHIAL NEEDLE ASPIRATION BIOPSIES;  Surgeon: Garner Nash, DO;  Location: [facility];  Service: Pulmonary;;  . COLONOSCOPY    . IR IMAGING GUIDED PORT INSERTION  12/31/2019  . kidney stone    . URETERAL STENT PLACEMENT    . Ureteral Stent Removed    . VIDEO BRONCHOSCOPY WITH ENDOBRONCHIAL ULTRASOUND N/A 12/10/2019   Procedure: VIDEO BRONCHOSCOPY WITH ENDOBRONCHIAL ULTRASOUND;  Surgeon: Garner Nash, DO;  Location: [facility];  Service: Pulmonary;  Laterality: N/A;    REVIEW OF SYSTEMS:  A comprehensive review of systems was negative except for: Respiratory: positive for dyspnea on exertion   PHYSICAL EXAMINATION: General appearance: alert, cooperative  and no distress Head: Normocephalic, without obvious abnormality, atraumatic Neck: no adenopathy, no JVD, supple, symmetrical, trachea midline and thyroid not enlarged, symmetric, no tenderness/mass/nodules Lymph nodes: Cervical, supraclavicular, and axillary nodes normal. Resp: clear to auscultation bilaterally Back: symmetric, no curvature. ROM normal. No CVA tenderness. Cardio: regular rate and rhythm, S1, S2 normal, no murmur, click, rub or gallop GI: soft, non-tender; bowel sounds normal; no masses,  no organomegaly Extremities: extremities normal, atraumatic, no cyanosis or edema  ECOG PERFORMANCE STATUS: 1 - Symptomatic but completely ambulatory  Blood pressure (!) 171/83, pulse 75, temperature (!) 96.4 F (35.8 C), temperature source Tympanic, resp. rate 19, height 5\' 9"  (1.753 m), weight 221 lb 4.8 oz (  100.4 kg), SpO2 100 %.  LABORATORY DATA: Lab Results  Component Value Date   WBC 7.0 01/21/2021   HGB 11.9 (L) 01/21/2021   HCT 34.0 (L) 01/21/2021   MCV 90.2 01/21/2021   PLT 182 01/21/2021      Chemistry      Component Value Date/Time   NA 134 (L) 01/21/2021 1100   NA 138 01/08/2021 0955   K 3.9 01/21/2021 1100   CL 102 01/21/2021 1100   CO2 25 01/21/2021 1100   BUN 32 (H) 01/21/2021 1100   BUN 28 (H) 01/08/2021 0955   CREATININE 1.83 (H) 01/21/2021 1100      Component Value Date/Time   CALCIUM 9.5 01/21/2021 1100   ALKPHOS 74 01/21/2021 1100   AST 17 01/21/2021 1100   ALT 26 01/21/2021 1100   BILITOT 0.3 01/21/2021 1100       RADIOGRAPHIC STUDIES: US Carotid Duplex Bilateral  Result Date: 01/21/2021 CLINICAL DATA:  65 year old male with a history bruit EXAM: BILATERAL CAROTID DUPLEX ULTRASOUND TECHNIQUE: Pearline Cables scale imaging, color Doppler and duplex ultrasound were performed of bilateral carotid and vertebral arteries in the neck. COMPARISON:  None. FINDINGS: Criteria: Quantification of carotid stenosis is based on velocity parameters that correlate the  residual internal carotid diameter with NASCET-based stenosis levels, using the diameter of the distal internal carotid lumen as the denominator for stenosis measurement. The following velocity measurements were obtained: RIGHT ICA:  Systolic 82 cm/sec, Diastolic 30 cm/sec CCA:  053 cm/sec SYSTOLIC ICA/CCA RATIO:  0.6 ECA:  101 cm/sec LEFT ICA:  Systolic 66 cm/sec, Diastolic 14 cm/sec CCA:  976 cm/sec SYSTOLIC ICA/CCA RATIO:  0.6 ECA:  72 cm/sec Right Brachial SBP: 147 Left Brachial SBP: 132 RIGHT CAROTID ARTERY: No significant calcified disease of the right common carotid artery. Intermediate waveform maintained. Heterogeneous plaque without significant calcifications at the right carotid bifurcation. Low resistance waveform of the right ICA. No significant tortuosity. RIGHT VERTEBRAL ARTERY: Antegrade flow with low resistance waveform. LEFT CAROTID ARTERY: No significant calcified disease of the left common carotid artery. Intermediate waveform maintained. Heterogeneous plaque at the left carotid bifurcation without significant calcifications. Low resistance waveform of the left ICA. LEFT VERTEBRAL ARTERY:  Antegrade flow with low resistance waveform. IMPRESSION: Color duplex indicates minimal heterogeneous plaque, with no hemodynamically significant stenosis by duplex criteria in the extracranial cerebrovascular circulation. Signed, Dulcy Fanny. Dellia Nims, RPVI [facility] Radiology Electronically Signed   By: Corrie Mckusick D.O.   On: 01/21/2021 11:50    ASSESSMENT AND PLAN: This is a very pleasant 65 years old African-American male recently diagnosed with a stage IV non-small cell lung cancer, adenocarcinoma with no actionable mutations presented with large right lower lobe lung mass in addition to mediastinal and right supraclavicular lymphadenopathy as well as bilateral pulmonary nodules diagnosed in February 2021. I explained to the patient that he has  incurable condition and all the treatment options will be of palliative nature. The patient has no actionable mutations on the recent molecular studies and he is not a candidate for the [facility] clinical trial. He started systemic chemotherapy with carboplatin, Alimta and Keytruda status post 18 cycles.  Starting from cycle #5 the patient is on maintenance treatment with Alimta and Keytruda every 3 weeks. The patient has been treated with single agent Keytruda the last few cycles because of the renal insufficiency. The patient continues to tolerate his treatment with single agent Keytruda fairly well. He was supposed to have repeat CT scan of  the chest, abdomen pelvis before this visit but unfortunately the scan is scheduled to be done next week. I recommended for him to proceed with cycle #19 today as planned. I will see him back for follow-up visit in 3 weeks for evaluation before starting cycle #20 and I will discuss his scan results with him at that time. For the hypertension he was advised to take his blood pressure medication as prescribed and to monitor it closely at home. He was advised to call immediately if he has any other concerning symptoms in the interval. The patient voices understanding of current disease status and treatment options and is in agreement with the current care plan.  All questions were answered. The patient knows to call the clinic with any problems, questions or concerns. We can certainly see the patient much sooner if necessary.  Disclaimer: This note was dictated with voice recognition software. Similar sounding words can inadvertently be transcribed and may not be corrected upon review.

## 2021-02-11 NOTE — Progress Notes (Signed)
Okay to treat with Creat 1.99 per Dr. Earlie Server

## 2021-02-11 NOTE — Progress Notes (Signed)
Called patient w/ financial concerns referred by [facility].  Introduced myself as Arboriculturist and offered available resources.  Discussed one-time $1000 Radio broadcast assistant to assist with personal expenses while going through treatment. Advised what is needed to apply. Patient will bring 02/12/21 @9am  to complete process.  He has my contact name and number for any additional financial questions or concerns.

## 2021-02-12 ENCOUNTER — Encounter: Payer: Self-pay | Admitting: Internal Medicine

## 2021-02-12 ENCOUNTER — Inpatient Hospital Stay: Payer: No Typology Code available for payment source

## 2021-02-12 ENCOUNTER — Telehealth: Payer: Self-pay | Admitting: Internal Medicine

## 2021-02-12 NOTE — Progress Notes (Signed)
Met with patient at registration to obtain income documents for grant.  Patient approved for one-time $1000 [employer] grant to assist with personal expenses while going through treatment. Discussed in detail expenses and how they are covered. He has a copy of the expense sheet and approval letter along with the Outpatient pharmacy information. He received a gift card today from the grant.  He has my card for any additional financial questions or concerns.

## 2021-02-12 NOTE — Telephone Encounter (Signed)
Scheduled per los. Called and spoke with patient. Confirmed appts  

## 2021-02-15 ENCOUNTER — Telehealth: Payer: Self-pay | Admitting: Medical Oncology

## 2021-02-15 NOTE — Telephone Encounter (Signed)
  Pt notified of delay in scans.

## 2021-02-15 NOTE — Telephone Encounter (Signed)
LVM that CT scan is not authorized yet and to keep appts as scheduled.

## 2021-02-17 ENCOUNTER — Ambulatory Visit (HOSPITAL_COMMUNITY): Payer: No Typology Code available for payment source

## 2021-02-26 ENCOUNTER — Encounter: Payer: Self-pay | Admitting: Nurse Practitioner

## 2021-02-26 ENCOUNTER — Ambulatory Visit (INDEPENDENT_AMBULATORY_CARE_PROVIDER_SITE_OTHER): Payer: No Typology Code available for payment source | Admitting: Nurse Practitioner

## 2021-02-26 ENCOUNTER — Other Ambulatory Visit: Payer: Self-pay

## 2021-02-26 VITALS — BP 136/69 | HR 71 | Temp 98.2°F | Ht 69.0 in | Wt 224.9 lb

## 2021-02-26 DIAGNOSIS — E1122 Type 2 diabetes mellitus with diabetic chronic kidney disease: Secondary | ICD-10-CM

## 2021-02-26 DIAGNOSIS — I129 Hypertensive chronic kidney disease with stage 1 through stage 4 chronic kidney disease, or unspecified chronic kidney disease: Secondary | ICD-10-CM

## 2021-02-26 DIAGNOSIS — I6523 Occlusion and stenosis of bilateral carotid arteries: Secondary | ICD-10-CM

## 2021-02-26 DIAGNOSIS — R42 Dizziness and giddiness: Secondary | ICD-10-CM | POA: Diagnosis not present

## 2021-02-26 DIAGNOSIS — IMO0001 Reserved for inherently not codable concepts without codable children: Secondary | ICD-10-CM

## 2021-02-26 DIAGNOSIS — C3490 Malignant neoplasm of unspecified part of unspecified bronchus or lung: Secondary | ICD-10-CM | POA: Diagnosis not present

## 2021-02-26 DIAGNOSIS — N289 Disorder of kidney and ureter, unspecified: Secondary | ICD-10-CM

## 2021-02-26 DIAGNOSIS — I10 Essential (primary) hypertension: Secondary | ICD-10-CM

## 2021-02-26 MED ORDER — DAPAGLIFLOZIN PROPANEDIOL 10 MG PO TABS: 10.0000 mg | ORAL_TABLET | Freq: Every day | ORAL | 1 refills | Status: DC

## 2021-02-26 MED ORDER — MECLIZINE HCL 12.5 MG PO TABS: 12.5000 mg | ORAL_TABLET | Freq: Three times a day (TID) | ORAL | 0 refills | Status: DC | PRN

## 2021-02-26 NOTE — Progress Notes (Signed)
Established Patient Office Visit  Subjective:  Patient ID: Kevin Lambert., male    DOB: August 08, 1956  Age: 65 y.o. MRN: [id number]  CC:  Chief Complaint  Patient presents with  . Follow-up    HPI [name]. presents for follow up of labs and carotid doppler study. He had been having intermittent episodes of dizziness which were causing him to feel like his equilibrium was off. He states that he continues to have this symptoms, however, the severity and frequency have decreased.  He did have a carotid doppler study done. His carotid doppler shows that he does have some calcified plaque in both carotid arteries without evidence of carotid artery stenosis or obstructions. He also had labs done. His renal functions appear to be decreasing. The eGFR was 32 while normal is >59. His oncologist is aware of these changes and has made some alterations in the patient's chemotherapy regimen. Subsequent checks indicate that renal functions are improving, however they are still not optimal. Calcium level was 10.6 which is higher than normal limits. ALT was also elevated. His urine microalbumin test was normal at 14.4. his HgbA1c was checked with new set of labs. This level was 7.0. he admits that he stopped taking his glipizide prior to these labs. He states that taking the glipizide was causing him to feel like his blood sugars were getting too low. He was getting shaky, sweaty, and nasueated. He states that he did has his provider through the [place] system about a trial of Jardiance. He was told that this was not covered. No alternatives were suggested.   Past Medical History:  Diagnosis Date  . Arthritis   . Chronic kidney disease   . Depression   . DM (diabetes mellitus) ([place])    type II  . Dyslipidemia   . Dyspnea    unable to walk much - he thinks it is from   . GERD (gastroesophageal reflux disease)   . Hepatitis    Hepatitis C- treated  . History of kidney stones   . HTN (hypertension)   .  Neuropathy   . nscl ca dx'd 10/2019  . OSA on CPAP   . PTSD (post-traumatic stress disorder)     Past Surgical History:  Procedure Laterality Date  . BIOPSY OF MEDIASTINAL MASS  12/10/2019   Procedure: BIOPSY OF MEDIASTINAL MASS;  Surgeon: Garner Nash, DO;  Location: [facility];  Service: Pulmonary;;  right upper lobe  . BRONCHIAL NEEDLE ASPIRATION BIOPSY  12/10/2019   Procedure: BRONCHIAL NEEDLE ASPIRATION BIOPSIES;  Surgeon: Garner Nash, DO;  Location: [facility];  Service: Pulmonary;;  . COLONOSCOPY    . IR IMAGING GUIDED PORT INSERTION  12/31/2019  . kidney stone    . URETERAL STENT PLACEMENT    . Ureteral Stent Removed    . VIDEO BRONCHOSCOPY WITH ENDOBRONCHIAL ULTRASOUND N/A 12/10/2019   Procedure: VIDEO BRONCHOSCOPY WITH ENDOBRONCHIAL ULTRASOUND;  Surgeon: Garner Nash, DO;  Location: [facility];  Service: Pulmonary;  Laterality: N/A;    Family History  Problem Relation Age of Onset  . Liver disease Mother   . Diabetes Father     Social History   Socioeconomic History  . Marital status: Single    Spouse name: Not on file  . Number of children: Not on file  . Years of education: Not on file  . Highest education level: Not on file  Occupational History  . Occupation: Pension scheme manager: [employer]  Use  . Smoking status: Current Every Day Smoker    Packs/day: 0.60    Years: 50.00    Pack years: 30.00    Types: Cigarettes  . Smokeless tobacco: Never Used  . Tobacco comment: 12/10/20: 1-2 Black & Milds and 4 cigarettes daily  Vaping Use  . Vaping Use: Never used  Substance and Sexual Activity  . Alcohol use: Yes    Alcohol/week: 6.0 standard drinks    Types: 6 Cans of beer per week  . Drug use: Not Currently  . Sexual activity: Yes    Birth control/protection: None  Other Topics Concern  . Not on file  Social History Narrative  . Not on file   Social Determinants of Health   Financial Resource Strain: Not on file  Food  Insecurity: Not on file  Transportation Needs: Not on file  Physical Activity: Not on file  Stress: Not on file  Social Connections: Not on file  Intimate Partner Violence: Not on file    Outpatient Medications Prior to Visit  Medication Sig Dispense Refill  . amLODipine (NORVASC) 10 MG tablet Take 10 mg by mouth daily.    . Ascorbic Acid (VITAMIN C) 1000 MG tablet Take 1,000 mg by mouth daily.    Marland Kitchen aspirin EC 81 MG tablet Take 81 mg by mouth daily.    Marland Kitchen atorvastatin (LIPITOR) 10 MG tablet Take 10 mg by mouth daily.    . bisacodyl (DULCOLAX) 5 MG EC tablet Take 5 mg by mouth daily as needed for moderate constipation.    . carvedilol (COREG) 3.125 MG tablet Take 1 tablet (3.125 mg total) by mouth 2 (two) times daily with a meal. 180 tablet 0  . cholecalciferol (VITAMIN D3) 25 MCG (1000 UNIT) tablet Take 1,000 Units by mouth daily.    . Cyanocobalamin (VITAMIN B 12 PO) Take 1 tablet by mouth daily.    . folic acid (FOLVITE) 1 MG tablet Take 1 tablet (1 mg total) by mouth daily. Pt needs to pick up today and start ASAP . He will pay out of pocket. He is waiting on the [place] to send his folic acid which will be next week. 7 tablet 0  . furosemide (LASIX) 20 MG tablet Take 1 tablet (20 mg total) by mouth daily. As needed 20 tablet 0  . glipiZIDE (GLUCOTROL) 5 MG tablet Take 5 mg by mouth daily before breakfast.     . lidocaine-prilocaine (EMLA) cream Apply to Port-A-Cath site 30-60-minute before treatment. 30 g 0  . magic mouthwash SOLN Take 5 mLs by mouth 4 (four) times daily as needed for mouth pain. Swish and swallow or spit 240 mL 2  . nicotine (NICODERM CQ) 21 mg/24hr patch Place 1 patch (21 mg total) onto the skin daily. 28 patch 3  . nicotine polacrilex (COMMIT) 4 MG lozenge Take 1 lozenge (4 mg total) by mouth as needed for smoking cessation. 108 tablet 3  . potassium chloride SA (KLOR-CON) 20 MEQ tablet Take 1 tablet (20 mEq total) by mouth daily. 10 tablet 0  . pregabalin (LYRICA) 25 MG  capsule Take 1 capsule (25 mg total) by mouth daily. 30 capsule 1  . Propylene Glycol (SYSTANE BALANCE OP) Place 1 drop into both eyes daily.    . sildenafil (VIAGRA) 100 MG tablet TAKE ONE TABLET BY MOUTH AS INSTRUCTED (TAKE 1 HOUR PRIOR TO SEXUAL ACTIVITY *DO NOT EXCEED 1 DOSE PER 24 HOUR PERIOD*)    . triamterene-hydrochlorothiazide (MAXZIDE-25) 37.5-25 MG tablet Take  1 tablet by mouth daily.    Marland Kitchen zinc gluconate 50 MG tablet Take 50 mg by mouth daily.    Marland Kitchen albuterol (VENTOLIN HFA) 108 (90 Base) MCG/ACT inhaler Inhale 2 puffs into the lungs every 4 (four) hours as needed for wheezing or shortness of breath. 18 g 0  . umeclidinium-vilanterol (ANORO ELLIPTA) 62.5-25 MCG/INH AEPB Inhale 1 puff into the lungs daily. 60 each 11  . umeclidinium-vilanterol (ANORO ELLIPTA) 62.5-25 MCG/INH AEPB Inhale 1 puff into the lungs daily. 14 each 0   No facility-administered medications prior to visit.    Allergies  Allergen Reactions  . Metformin And Related Other (See Comments)    Severe muscle spasms  . Naproxen     Pt states it have him off balance.  . Oxycontin [Oxycodone] Hives    ROS Review of Systems  Constitutional: Positive for fatigue. Negative for activity change, diaphoresis and fever.  HENT: Negative for congestion, postnasal drip, rhinorrhea, sinus pressure and sinus pain.   Eyes: Negative.   Respiratory: Negative for cough, chest tightness, shortness of breath and wheezing.   Cardiovascular: Negative for chest pain and palpitations.  Gastrointestinal: Negative for constipation, diarrhea, nausea and vomiting.  Endocrine: Negative for cold intolerance, heat intolerance, polydipsia and polyuria.       Blood sugar has been elevated recently. HgbA1c 7.0.   Musculoskeletal: Negative.   Allergic/Immunologic: Negative.   Neurological: Positive for dizziness, weakness and light-headedness.       Intermittent episodes dizziness. Feeling unsteady on his feet.   Psychiatric/Behavioral:  Negative for dysphoric mood. The patient is not nervous/anxious.       Objective:    Physical Exam Vitals and nursing note reviewed.  Constitutional:      Appearance: Normal appearance. He is well-developed. He is obese.  HENT:     Head: Normocephalic.     Right Ear: Ear canal and external ear normal.     Left Ear: Ear canal and external ear normal.  Eyes:     Extraocular Movements: Extraocular movements intact.     Conjunctiva/sclera: Conjunctivae normal.     Pupils: Pupils are equal, round, and reactive to light.  Neck:     Vascular: Carotid bruit present.     Comments: Soft carotid doppler heard on left side of the neck.  Cardiovascular:     Rate and Rhythm: Normal rate and regular rhythm.     Pulses: Normal pulses.     Heart sounds: Normal heart sounds.  Pulmonary:     Effort: Pulmonary effort is normal.     Breath sounds: Normal breath sounds.  Abdominal:     General: Bowel sounds are normal.     Palpations: Abdomen is soft.     Tenderness: There is no abdominal tenderness.  Musculoskeletal:        General: Normal range of motion.     Cervical back: Normal range of motion and neck supple.  Skin:    General: Skin is warm and dry.     Capillary Refill: Capillary refill takes less than 2 seconds.  Neurological:     General: No focal deficit present.     Mental Status: He is alert and oriented to person, place, and time.  Psychiatric:        Mood and Affect: Mood normal.        Behavior: Behavior normal.        Thought Content: Thought content normal.        Judgment: Judgment normal.  Today's Vitals   02/26/21 0901  BP: 136/69  Pulse: 71  Temp: 98.2 F (36.8 C)  SpO2: 99%  Weight: 224 lb 14.4 oz (102 kg)  Height: $Remove'5\' 9"'nKCUyJa$  (1.753 m)   Body mass index is 33.21 kg/m.   Wt Readings from Last 3 Encounters:  02/26/21 224 lb 14.4 oz (102 kg)  02/11/21 221 lb 4.8 oz (100.4 kg)  01/21/21 222 lb 8 oz (100.9 kg)     Health Maintenance Due  Topic Date Due   . Hepatitis C Screening  Never done  . FOOT EXAM  Never done  . OPHTHALMOLOGY EXAM  Never done  . HIV Screening  Never done  . COLONOSCOPY (Pts 45-32yrs Insurance coverage will need to be confirmed)  Never done  . COVID-19 Vaccine (2 - Moderna risk 4-dose series) 01/31/2020    There are no preventive care reminders to display for this patient.  Lab Results  Component Value Date   TSH 1.104 02/11/2021   Lab Results  Component Value Date   WBC 5.9 02/11/2021   HGB 12.9 (L) 02/11/2021   HCT 36.7 (L) 02/11/2021   MCV 91.3 02/11/2021   PLT 204 02/11/2021   Lab Results  Component Value Date   NA 138 02/11/2021   K 4.3 02/11/2021   CO2 22 02/11/2021   GLUCOSE 117 (H) 02/11/2021   BUN 33 (H) 02/11/2021   CREATININE 1.99 (H) 02/11/2021   BILITOT 0.3 02/11/2021   ALKPHOS 66 02/11/2021   AST 16 02/11/2021   ALT 29 02/11/2021   PROT 6.9 02/11/2021   ALBUMIN 3.7 02/11/2021   CALCIUM 10.1 02/11/2021   ANIONGAP 12 02/11/2021   EGFR 32 (L) 01/08/2021   No results found for: CHOL No results found for: HDL No results found for: LDLCALC No results found for: TRIG No results found for: CHOLHDL Lab Results  Component Value Date   HGBA1C 7.0 (H) 01/08/2021      Assessment & Plan:  1. Dizziness Improving frequency and severity of symptoms. Will give prescription of meclizine 12.$RemoveBeforeD'5mg'cQwDUvTKxQohSN$  up to three times daily as needed for dizziness.  - meclizine (ANTIVERT) 12.5 MG tablet; Take 1 tablet (12.5 mg total) by mouth 3 (three) times daily as needed for dizziness.  Dispense: 90 tablet; Refill: 0  2. Atherosclerosis of both carotid arteries Reviewed results of carotid doppler study with the patient.  There is mild carotid atherosclerosis without significant stenosis or obstruction. Will repeat study in one year for surveillance.   3. 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 Trial Farxiga $RemoveBefor'10mg'PwDoxJWrGgsP$  daily. New prescription sent to his pharmacy  today. He should monitor his blood sugars at home. Recheck HgbA1c at next visit.  - dapagliflozin propanediol (FARXIGA) 10 MG TABS tablet; Take 1 tablet (10 mg total) by mouth daily.  Dispense: 90 tablet; Refill: 1  4. Malignant neoplasm of lung, unspecified laterality, unspecified part of lung ([place]) Continue regular visits with oncology as scheduled.   5. Essential hypertension Stable. Continue bp medication as prescribed.   6. Abnormal renal function test Renal functions higher than previous check with oncology. Therapy changes made to help imrove renal functions and subsequent checks indicate that there is improvement in renal functions. Will monitor closely for next few months and will consider referral to nephrology if indicated.    Problem List Items Addressed This Visit      Cardiovascular and Mediastinum   Atherosclerosis of both carotid arteries   Type 2 diabetes  mellitus with chronic kidney disease and hypertension (HCC)   Relevant Medications   dapagliflozin propanediol (FARXIGA) 10 MG TABS tablet   Essential hypertension     Respiratory   Malignant neoplasm of lung (HCC)   Relevant Medications   meclizine (ANTIVERT) 12.5 MG tablet     Other   Dizziness - Primary   Relevant Medications   meclizine (ANTIVERT) 12.5 MG tablet      Meds ordered this encounter  Medications  . dapagliflozin propanediol (FARXIGA) 10 MG TABS tablet    Sig: Take 1 tablet (10 mg total) by mouth daily.    Dispense:  90 tablet    Refill:  1    Patient has had negative side effects from metformin and glipizide    Order Specific Question:   Supervising Provider    Answer:   Beatrice Lecher D [[id number]]  . meclizine (ANTIVERT) 12.5 MG tablet    Sig: Take 1 tablet (12.5 mg total) by mouth 3 (three) times daily as needed for dizziness.    Dispense:  90 tablet    Refill:  0    Order Specific Question:   Supervising Provider    Answer:   Beatrice Lecher D [[id number]]    Follow-up: Return in  about 2 months (around 04/28/2021) for mwv - check HgbA1c and get foot exam .    [name], NP

## 2021-02-26 NOTE — Patient Instructions (Signed)
Benign Positional Vertigo Vertigo is the feeling that you or your surroundings are moving when they are not. Benign positional vertigo is the most common form of vertigo. This is usually a harmless condition (benign). This condition is positional. This means that symptoms are triggered by certain movements and positions. This condition can be dangerous if it occurs while you are doing something that could cause harm to you or others. This includes activities such as driving or operating machinery. What are the causes? The inner ear has fluid-filled canals that help your brain sense movement and balance. When the fluid moves, the brain receives messages about your body's position. With benign positional vertigo, crystals in the inner ear break free and disturb the inner ear area. This causes your brain to receive confusing messages about your body's position. What increases the risk? You are more likely to develop this condition if:  You are a woman.  You are 50 years of age or older.  You have recently had a head injury.  You have an inner ear disease. What are the signs or symptoms? Symptoms of this condition usually happen when you move your head or your eyes in different directions. Symptoms may start suddenly, and usually last for less than a minute. They include:  Loss of balance and falling.  Feeling like you are spinning or moving.  Feeling like your surroundings are spinning or moving.  Nausea and vomiting.  Blurred vision.  Dizziness.  Involuntary eye movement (nystagmus). Symptoms can be mild and cause only minor problems, or they can be severe and interfere with daily life. Episodes of benign positional vertigo may return (recur) over time. Symptoms may improve over time. How is this diagnosed? This condition may be diagnosed based on:  Your medical history.  Physical exam of the head, neck, and ears.  Positional tests to check for or stimulate vertigo. You may be  asked to turn your head and change positions, such as going from sitting to lying down. A health care provider will watch for symptoms of vertigo. You may be referred to a health care provider who specializes in ear, nose, and throat problems (ENT, or otolaryngologist) or a provider who specializes in disorders of the nervous system (neurologist). How is this treated? This condition may be treated in a session in which your health care provider moves your head in specific positions to help the displaced crystals in your inner ear move. Treatment for this condition may take several sessions. Surgery may be needed in severe cases, but this is rare. In some cases, benign positional vertigo may resolve on its own in 2-4 weeks.   Follow these instructions at home: Safety  Move slowly. Avoid sudden body or head movements or certain positions, as told by your health care provider.  Avoid driving until your health care provider says it is safe for you to do so.  Avoid operating heavy machinery until your health care provider says it is safe for you to do so.  Avoid doing any tasks that would be dangerous to you or others if vertigo occurs.  If you have trouble walking or keeping your balance, try using a cane for stability. If you feel dizzy or unstable, sit down right away.  Return to your normal activities as told by your health care provider. Ask your health care provider what activities are safe for you. General instructions  Take over-the-counter and prescription medicines only as told by your health care provider.  Drink enough fluid   to keep your urine pale yellow.  Keep all follow-up visits as told by your health care provider. This is important. Contact a health care provider if:  You have a fever.  Your condition gets worse or you develop new symptoms.  Your family or friends notice any behavioral changes.  You have nausea or vomiting that gets worse.  You have numbness or a  prickling and tingling sensation. Get help right away if you:  Have difficulty speaking or moving.  Are always dizzy.  Faint.  Develop severe headaches.  Have weakness in your legs or arms.  Have changes in your hearing or vision.  Develop a stiff neck.  Develop sensitivity to light. Summary  Vertigo is the feeling that you or your surroundings are moving when they are not. Benign positional vertigo is the most common form of vertigo.  This condition is caused by crystals in the inner ear that become displaced. This causes a disturbance in an area of the inner ear that helps your brain sense movement and balance.  Symptoms include loss of balance and falling, feeling that you or your surroundings are moving, nausea and vomiting, and blurred vision.  This condition can be diagnosed based on symptoms, a physical exam, and positional tests.  Follow safety instructions as told by your health care provider. You will also be told when to contact your health care provider in case of problems. This information is not intended to replace advice given to you by your health care provider. Make sure you discuss any questions you have with your health care provider. Document Revised: 09/17/2019 Document Reviewed: 04/04/2018 Elsevier Patient Education  2021 Elsevier Inc.  

## 2021-02-28 DIAGNOSIS — I6523 Occlusion and stenosis of bilateral carotid arteries: Secondary | ICD-10-CM | POA: Insufficient documentation

## 2021-02-28 DIAGNOSIS — IMO0001 Reserved for inherently not codable concepts without codable children: Secondary | ICD-10-CM | POA: Insufficient documentation

## 2021-02-28 DIAGNOSIS — N289 Disorder of kidney and ureter, unspecified: Secondary | ICD-10-CM | POA: Insufficient documentation

## 2021-02-28 DIAGNOSIS — I10 Essential (primary) hypertension: Secondary | ICD-10-CM | POA: Insufficient documentation

## 2021-02-28 DIAGNOSIS — E1122 Type 2 diabetes mellitus with diabetic chronic kidney disease: Secondary | ICD-10-CM | POA: Insufficient documentation

## 2021-03-02 ENCOUNTER — Ambulatory Visit (HOSPITAL_BASED_OUTPATIENT_CLINIC_OR_DEPARTMENT_OTHER)
Admission: RE | Admit: 2021-03-02 | Discharge: 2021-03-02 | Disposition: A | Discharge status: Home / Self Care | Payer: No Typology Code available for payment source | Source: Ambulatory Visit | Attending: Internal Medicine | Admitting: Internal Medicine

## 2021-03-02 ENCOUNTER — Other Ambulatory Visit: Payer: Self-pay

## 2021-03-02 DIAGNOSIS — C3490 Malignant neoplasm of unspecified part of unspecified bronchus or lung: Secondary | ICD-10-CM | POA: Insufficient documentation

## 2021-03-03 ENCOUNTER — Ambulatory Visit (HOSPITAL_BASED_OUTPATIENT_CLINIC_OR_DEPARTMENT_OTHER): Payer: No Typology Code available for payment source

## 2021-03-04 ENCOUNTER — Inpatient Hospital Stay: Payer: No Typology Code available for payment source

## 2021-03-04 ENCOUNTER — Other Ambulatory Visit: Payer: Self-pay

## 2021-03-04 ENCOUNTER — Other Ambulatory Visit: Payer: No Typology Code available for payment source

## 2021-03-04 ENCOUNTER — Inpatient Hospital Stay (HOSPITAL_BASED_OUTPATIENT_CLINIC_OR_DEPARTMENT_OTHER): Payer: No Typology Code available for payment source | Admitting: Internal Medicine

## 2021-03-04 ENCOUNTER — Encounter: Payer: Self-pay | Admitting: Internal Medicine

## 2021-03-04 VITALS — BP 160/67 | HR 79 | Temp 97.8°F | Resp 19 | Ht 69.0 in | Wt 225.1 lb

## 2021-03-04 DIAGNOSIS — C3491 Malignant neoplasm of unspecified part of right bronchus or lung: Secondary | ICD-10-CM

## 2021-03-04 DIAGNOSIS — I10 Essential (primary) hypertension: Secondary | ICD-10-CM

## 2021-03-04 DIAGNOSIS — Z5112 Encounter for antineoplastic immunotherapy: Secondary | ICD-10-CM | POA: Diagnosis not present

## 2021-03-04 DIAGNOSIS — Z95828 Presence of other vascular implants and grafts: Secondary | ICD-10-CM

## 2021-03-04 LAB — CMP (CANCER CENTER ONLY)
ALT: 24 U/L (ref 0–44)
AST: 17 U/L (ref 15–41)
Albumin: 3.4 g/dL — ABNORMAL LOW (ref 3.5–5.0)
Alkaline Phosphatase: 67 U/L (ref 38–126)
Anion gap: 12 (ref 5–15)
BUN: 24 mg/dL — ABNORMAL HIGH (ref 8–23)
CO2: 22 mmol/L (ref 22–32)
Calcium: 9.4 mg/dL (ref 8.9–10.3)
Chloride: 106 mmol/L (ref 98–111)
Creatinine: 1.51 mg/dL — ABNORMAL HIGH (ref 0.61–1.24)
GFR, Estimated: 51 mL/min — ABNORMAL LOW (ref >60)
Glucose, Bld: 126 mg/dL — ABNORMAL HIGH (ref 70–99)
Potassium: 3.9 mmol/L (ref 3.5–5.1)
Sodium: 140 mmol/L (ref 135–145)
Total Bilirubin: <0.2 mg/dL — ABNORMAL LOW (ref 0.3–1.2)
Total Protein: 6.3 g/dL — ABNORMAL LOW (ref 6.5–8.1)

## 2021-03-04 LAB — CBC WITH DIFFERENTIAL (CANCER CENTER ONLY)
Abs Immature Granulocytes: 0.02 10*3/uL (ref 0.00–0.07)
Basophils Absolute: 0 10*3/uL (ref 0.0–0.1)
Basophils Relative: 1 %
Eosinophils Absolute: 0.3 10*3/uL (ref 0.0–0.5)
Eosinophils Relative: 5 %
HCT: 33.2 % — ABNORMAL LOW (ref 39.0–52.0)
Hemoglobin: 11.6 g/dL — ABNORMAL LOW (ref 13.0–17.0)
Immature Granulocytes: 0 %
Lymphocytes Relative: 37 %
Lymphs Abs: 2 10*3/uL (ref 0.7–4.0)
MCH: 32.1 pg (ref 26.0–34.0)
MCHC: 34.9 g/dL (ref 30.0–36.0)
MCV: 92 fL (ref 80.0–100.0)
Monocytes Absolute: 0.5 10*3/uL (ref 0.1–1.0)
Monocytes Relative: 10 %
Neutro Abs: 2.6 10*3/uL (ref 1.7–7.7)
Neutrophils Relative %: 47 %
Platelet Count: 167 10*3/uL (ref 150–400)
RBC: 3.61 MIL/uL — ABNORMAL LOW (ref 4.22–5.81)
RDW: 14.8 % (ref 11.5–15.5)
WBC Count: 5.4 10*3/uL (ref 4.0–10.5)
nRBC: 0 % (ref 0.0–0.2)

## 2021-03-04 LAB — TSH: TSH: 0.454 u[IU]/mL (ref 0.320–4.118)

## 2021-03-04 MED ORDER — SODIUM CHLORIDE 0.9% FLUSH
10.0000 mL | Freq: Once | INTRAVENOUS | Status: AC
  Administered 2021-03-04: 10 mL
  Filled 2021-03-04: qty 10

## 2021-03-04 MED ORDER — SODIUM CHLORIDE 0.9 % IV SOLN
Freq: Once | INTRAVENOUS | Status: AC
Start: 2021-03-04 — End: 2021-03-04
  Filled 2021-03-04: qty 250

## 2021-03-04 MED ORDER — HEPARIN SOD (PORK) LOCK FLUSH 100 UNIT/ML IV SOLN
500.0000 [IU] | Freq: Once | INTRAVENOUS | Status: AC | PRN
  Administered 2021-03-04: 500 [IU]
  Filled 2021-03-04: qty 5

## 2021-03-04 MED ORDER — SODIUM CHLORIDE 0.9 % IV SOLN
200.0000 mg | Freq: Once | INTRAVENOUS | Status: AC
  Administered 2021-03-04: 200 mg via INTRAVENOUS
  Filled 2021-03-04: qty 8

## 2021-03-04 MED ORDER — SODIUM CHLORIDE 0.9% FLUSH
10.0000 mL | INTRAVENOUS | Status: DC | PRN
  Administered 2021-03-04: 10 mL
  Filled 2021-03-04: qty 10

## 2021-03-04 NOTE — Patient Instructions (Signed)
[facility]  Discharge Instructions: °Thank you for choosing [facility] to provide your oncology and hematology care.  ° °If you have a lab appointment with the Cancer Center, please go directly to the Cancer Center and check in at the registration area. °  °Wear comfortable clothing and clothing appropriate for easy access to any Portacath or PICC line.  ° °We strive to give you quality time with your provider. You may need to reschedule your appointment if you arrive late (15 or more minutes).  Arriving late affects you and other patients whose appointments are after yours.  Also, if you miss three or more appointments without notifying the office, you may be dismissed from the clinic at the provider’s discretion.    °  °For prescription refill requests, have your pharmacy contact our office and allow 72 hours for refills to be completed.   ° °Today you received the following chemotherapy and/or immunotherapy agent: Pembrolizumab (Keytruda) °  °To help prevent nausea and vomiting after your treatment, we encourage you to take your nausea medication as directed. ° °BELOW ARE SYMPTOMS THAT SHOULD BE REPORTED IMMEDIATELY: °*FEVER GREATER THAN 100.4 F (38 °C) OR HIGHER °*CHILLS OR SWEATING °*NAUSEA AND VOMITING THAT IS NOT CONTROLLED WITH YOUR NAUSEA MEDICATION °*UNUSUAL SHORTNESS OF BREATH °*UNUSUAL BRUISING OR BLEEDING °*URINARY PROBLEMS (pain or burning when urinating, or frequent urination) °*BOWEL PROBLEMS (unusual diarrhea, constipation, pain near the anus) °TENDERNESS IN MOUTH AND THROAT WITH OR WITHOUT PRESENCE OF ULCERS (sore throat, sores in mouth, or a toothache) °UNUSUAL RASH, SWELLING OR PAIN  °UNUSUAL VAGINAL DISCHARGE OR ITCHING  ° °Items with * indicate a potential emergency and should be followed up as soon as possible or go to the Emergency Department if any problems should occur. ° °Please show the CHEMOTHERAPY ALERT CARD or IMMUNOTHERAPY ALERT CARD at  check-in to the Emergency Department and triage nurse. ° °Should you have questions after your visit or need to cancel or reschedule your appointment, please contact [facility]  Dept: [phone]  and follow the prompts.  Office hours are 8:00 a.m. to 4:30 p.m. Monday - Friday. Please note that voicemails left after 4:00 p.m. may not be returned until the following business day.  We are closed weekends and major holidays. You have access to a nurse at all times for urgent questions. Please call the main number to the clinic Dept: [phone] and follow the prompts. ° ° °For any non-urgent questions, you may also contact your provider using MyChart. We now offer e-Visits for anyone 18 and older to request care online for non-urgent symptoms. For details visit [email or web address]. °  °Also download the MyChart app! Go to the app store, search "MyChart", open the app, select [facility], and log in with your MyChart username and password. ° °Due to Covid, a mask is required upon entering the hospital/clinic. If you do not have a mask, one will be given to you upon arrival. For doctor visits, patients may have 1 support person aged 18 or older with them. For treatment visits, patients cannot have anyone with them due to current Covid guidelines and our immunocompromised population.  ° °

## 2021-03-04 NOTE — Progress Notes (Signed)
Okay to tx with creatinine of 1.51 per Dr. Julien Nordmann

## 2021-03-04 NOTE — Progress Notes (Signed)
[place] [phone]   [phone]  OFFICE PROGRESS NOTE  Kevin Freshwater, NP [place] [address]  DIAGNOSIS: Stage IV (T2b, N3, M1a) non-small cell lung cancer, adenocarcinoma presented with large right upper lobe lung mass in addition to mediastinal and right supraclavicular lymphadenopathy as well as bilateral pulmonary nodules diagnosed in February 2021.  MOLECULAR STUDY by [employer]:  KRASG12C, 4.3%, Binimetinib  [id number], 0.9%, Niraparib, Olaparib, Rucaparib,Talazoparib, Tazemetostat  [id number], 1.7%, None   PRIOR THERAPY: None  CURRENT THERAPY: Systemic chemotherapy with carboplatin for AUC of 5, Alimta 500 mg/M2 and Keytruda 200 mg IV every 3 weeks.  First dose December 31, 2019.  Status post 19 cycles.  The last few cycles he has been treated with single agent Keytruda secondary to renal insufficiency.  INTERVAL HISTORY: Kevin Lambert. 65 y.o. male returns to the clinic today for follow-up visit.  The patient is feeling fine today with no concerning complaints.  He denied having any current chest pain, shortness of breath, cough or hemoptysis.  He denied having any fever or chills.  He has no nausea, vomiting, diarrhea or constipation.  He has no headache or visual changes.  He has no significant weight loss or night sweats.  He has no rash.  The patient continues to tolerate his maintenance treatment with [facility] fairly well.  He had repeat CT scan of the chest, abdomen pelvis performed recently and he is here for evaluation and discussion of his scan results.  MEDICAL HISTORY: Past Medical History:  Diagnosis Date  . Arthritis   . Chronic kidney disease   . Depression   . DM (diabetes mellitus) ([place])    type II  . Dyslipidemia   . Dyspnea    unable to walk much - he thinks it is from   . GERD (gastroesophageal reflux disease)   . Hepatitis    Hepatitis C- treated  . History of kidney  stones   . HTN (hypertension)   . Neuropathy   . nscl ca dx'd 10/2019  . OSA on CPAP   . PTSD (post-traumatic stress disorder)     ALLERGIES:  is allergic to metformin and related, naproxen, and oxycontin [oxycodone].  MEDICATIONS:  Current Outpatient Medications  Medication Sig Dispense Refill  . amLODipine (NORVASC) 10 MG tablet Take 10 mg by mouth daily.    . Ascorbic Acid (VITAMIN C) 1000 MG tablet Take 1,000 mg by mouth daily.    Marland Kitchen aspirin EC 81 MG tablet Take 81 mg by mouth daily.    Marland Kitchen atorvastatin (LIPITOR) 10 MG tablet Take 10 mg by mouth daily.    . bisacodyl (DULCOLAX) 5 MG EC tablet Take 5 mg by mouth daily as needed for moderate constipation.    . carvedilol (COREG) 3.125 MG tablet Take 1 tablet (3.125 mg total) by mouth 2 (two) times daily with a meal. 180 tablet 0  . cholecalciferol (VITAMIN D3) 25 MCG (1000 UNIT) tablet Take 1,000 Units by mouth daily.    . Cyanocobalamin (VITAMIN B 12 PO) Take 1 tablet by mouth daily.    . dapagliflozin propanediol (FARXIGA) 10 MG TABS tablet Take 1 tablet (10 mg total) by mouth daily. 90 tablet 1  . folic acid (FOLVITE) 1 MG tablet Take 1 tablet (1 mg total) by mouth daily. Pt needs to pick up today and start ASAP . He will pay out of pocket. He is waiting on the [place]  to send his folic acid which will be next week. 7 tablet 0  . furosemide (LASIX) 20 MG tablet Take 1 tablet (20 mg total) by mouth daily. As needed 20 tablet 0  . glipiZIDE (GLUCOTROL) 5 MG tablet Take 5 mg by mouth daily before breakfast.     . lidocaine-prilocaine (EMLA) cream Apply to Port-A-Cath site 30-60-minute before treatment. 30 g 0  . magic mouthwash SOLN Take 5 mLs by mouth 4 (four) times daily as needed for mouth pain. Swish and swallow or spit 240 mL 2  . meclizine (ANTIVERT) 12.5 MG tablet Take 1 tablet (12.5 mg total) by mouth 3 (three) times daily as needed for dizziness. 90 tablet 0  . nicotine (NICODERM CQ) 21 mg/24hr patch Place 1 patch (21 mg total) onto  the skin daily. 28 patch 3  . nicotine polacrilex (COMMIT) 4 MG lozenge Take 1 lozenge (4 mg total) by mouth as needed for smoking cessation. 108 tablet 3  . potassium chloride SA (KLOR-CON) 20 MEQ tablet Take 1 tablet (20 mEq total) by mouth daily. 10 tablet 0  . pregabalin (LYRICA) 25 MG capsule Take 1 capsule (25 mg total) by mouth daily. 30 capsule 1  . Propylene Glycol (SYSTANE BALANCE OP) Place 1 drop into both eyes daily.    . sildenafil (VIAGRA) 100 MG tablet TAKE ONE TABLET BY MOUTH AS INSTRUCTED (TAKE 1 HOUR PRIOR TO SEXUAL ACTIVITY *DO NOT EXCEED 1 DOSE PER 24 HOUR PERIOD*)    . triamterene-hydrochlorothiazide (MAXZIDE-25) 37.5-25 MG tablet Take 1 tablet by mouth daily.    Marland Kitchen zinc gluconate 50 MG tablet Take 50 mg by mouth daily.     No current facility-administered medications for this visit.    SURGICAL HISTORY:  Past Surgical History:  Procedure Laterality Date  . BIOPSY OF MEDIASTINAL MASS  12/10/2019   Procedure: BIOPSY OF MEDIASTINAL MASS;  Surgeon: Garner Nash, DO;  Location: [facility];  Service: Pulmonary;;  right upper lobe  . BRONCHIAL NEEDLE ASPIRATION BIOPSY  12/10/2019   Procedure: BRONCHIAL NEEDLE ASPIRATION BIOPSIES;  Surgeon: Garner Nash, DO;  Location: [facility];  Service: Pulmonary;;  . COLONOSCOPY    . IR IMAGING GUIDED PORT INSERTION  12/31/2019  . kidney stone    . URETERAL STENT PLACEMENT    . Ureteral Stent Removed    . VIDEO BRONCHOSCOPY WITH ENDOBRONCHIAL ULTRASOUND N/A 12/10/2019   Procedure: VIDEO BRONCHOSCOPY WITH ENDOBRONCHIAL ULTRASOUND;  Surgeon: Garner Nash, DO;  Location: [facility];  Service: Pulmonary;  Laterality: N/A;    REVIEW OF SYSTEMS:  Constitutional: positive for fatigue Eyes: negative Ears, nose, mouth, throat, and face: negative Respiratory: negative Cardiovascular: negative Gastrointestinal: negative Genitourinary:negative Integument/breast: negative Hematologic/lymphatic:  negative Musculoskeletal:negative Neurological: negative Behavioral/Psych: negative Endocrine: negative Allergic/Immunologic: negative   PHYSICAL EXAMINATION: General appearance: alert, cooperative, fatigued and no distress Head: Normocephalic, without obvious abnormality, atraumatic Neck: no adenopathy, no JVD, supple, symmetrical, trachea midline and thyroid not enlarged, symmetric, no tenderness/mass/nodules Lymph nodes: Cervical, supraclavicular, and axillary nodes normal. Resp: clear to auscultation bilaterally Back: symmetric, no curvature. ROM normal. No CVA tenderness. Cardio: regular rate and rhythm, S1, S2 normal, no murmur, click, rub or gallop GI: soft, non-tender; bowel sounds normal; no masses,  no organomegaly Extremities: extremities normal, atraumatic, no cyanosis or edema Neurologic: Alert and oriented X 3, normal strength and tone. Normal symmetric reflexes. Normal coordination and gait  ECOG PERFORMANCE STATUS: 1 - Symptomatic but completely ambulatory  Blood pressure (!) 160/67, pulse 79, temperature 97.8 F (  36.6 C), temperature source Tympanic, resp. rate 19, height 5\' 9"  (1.753 m), weight 225 lb 1.6 oz (102.1 kg), SpO2 98 %.  LABORATORY DATA: Lab Results  Component Value Date   WBC 5.9 02/11/2021   HGB 12.9 (L) 02/11/2021   HCT 36.7 (L) 02/11/2021   MCV 91.3 02/11/2021   PLT 204 02/11/2021      Chemistry      Component Value Date/Time   NA 138 02/11/2021 0825   NA 138 01/08/2021 0955   K 4.3 02/11/2021 0825   CL 104 02/11/2021 0825   CO2 22 02/11/2021 0825   BUN 33 (H) 02/11/2021 0825   BUN 28 (H) 01/08/2021 0955   CREATININE 1.99 (H) 02/11/2021 0825      Component Value Date/Time   CALCIUM 10.1 02/11/2021 0825   ALKPHOS 66 02/11/2021 0825   AST 16 02/11/2021 0825   ALT 29 02/11/2021 0825   BILITOT 0.3 02/11/2021 0825       RADIOGRAPHIC STUDIES: CT Abdomen Pelvis Wo Contrast  Result Date: 03/02/2021 CLINICAL DATA:  Non-small cell lung  cancer staging EXAM: CT CHEST, ABDOMEN AND PELVIS WITHOUT CONTRAST TECHNIQUE: Multidetector CT imaging of the chest, abdomen and pelvis was performed following the standard protocol without IV contrast. Oral enteric contrast was administered. COMPARISON:  11/16/2020 FINDINGS: CT CHEST FINDINGS Cardiovascular: Right chest port catheter. Aortic atherosclerosis. Normal heart size. Three-vessel coronary artery calcifications. No pericardial effusion. Mediastinum/Nodes: Unchanged prominent pretracheal lymph node measuring 1.6 x 0.8 cm (series 2, image 47). Thyroid gland, trachea, and esophagus demonstrate no significant findings. Lungs/Pleura: Spiculated perihilar right upper lobe mass is slightly diminished in size, measuring 2.4 x 2.0 cm, previously 2.6 x 2.4 cm when measured similarly (series 4, image 58). No pleural effusion or pneumothorax. Musculoskeletal: No chest wall mass or suspicious bone lesions identified. CT ABDOMEN PELVIS FINDINGS Hepatobiliary: No solid liver abnormality is seen. No gallstones, gallbladder wall thickening, or biliary dilatation. Pancreas: Unremarkable. No pancreatic ductal dilatation or surrounding inflammatory changes. Spleen: Normal in size without significant abnormality. Adrenals/Urinary Tract: Stable, benign, small fatty attenuation adrenal adenomata bilaterally (series 2, image 17). Small nonobstructive calculus of the inferior pole of the left kidney. No hydronephrosis. Low-attenuation lesion of the posterior midportion of the right kidney, likely a cyst although incompletely characterized. Thickening of the urinary bladder, likely due to chronic outlet obstruction Stomach/Bowel: Stomach is within normal limits. Appendix appears normal. No evidence of bowel wall thickening, distention, or inflammatory changes. Vascular/Lymphatic: Aortic atherosclerosis. No enlarged abdominal or pelvic lymph nodes. Reproductive: Prostatomegaly. Other: No abdominal wall hernia or abnormality. No  abdominopelvic ascites. Musculoskeletal: No acute or significant osseous findings. IMPRESSION: 1. Spiculated perihilar right upper lobe mass is slightly diminished in size, measuring 2.4 x 2.0 cm, previously 2.6 x 2.4 cm when measured similarly. Findings are consistent with continued treatment response. 2. Unchanged prominent pretracheal lymph node measuring 1.6 x 0.8 cm. 3. No noncontrast evidence of metastatic disease in the abdomen or pelvis. 4. Nonobstructive left nephrolithiasis. 5. Prostatomegaly with thickening of the urinary bladder, likely due to chronic outlet obstruction. 6. Coronary artery disease. Aortic Atherosclerosis (ICD10-I70.0). Electronically Signed   By: Eddie Candle M.D.   On: 03/02/2021 12:37   CT Chest Wo Contrast  Result Date: 03/02/2021 CLINICAL DATA:  Non-small cell lung cancer staging EXAM: CT CHEST, ABDOMEN AND PELVIS WITHOUT CONTRAST TECHNIQUE: Multidetector CT imaging of the chest, abdomen and pelvis was performed following the standard protocol without IV contrast. Oral enteric contrast was administered. COMPARISON:  11/16/2020 FINDINGS:  CT CHEST FINDINGS Cardiovascular: Right chest port catheter. Aortic atherosclerosis. Normal heart size. Three-vessel coronary artery calcifications. No pericardial effusion. Mediastinum/Nodes: Unchanged prominent pretracheal lymph node measuring 1.6 x 0.8 cm (series 2, image 47). Thyroid gland, trachea, and esophagus demonstrate no significant findings. Lungs/Pleura: Spiculated perihilar right upper lobe mass is slightly diminished in size, measuring 2.4 x 2.0 cm, previously 2.6 x 2.4 cm when measured similarly (series 4, image 58). No pleural effusion or pneumothorax. Musculoskeletal: No chest wall mass or suspicious bone lesions identified. CT ABDOMEN PELVIS FINDINGS Hepatobiliary: No solid liver abnormality is seen. No gallstones, gallbladder wall thickening, or biliary dilatation. Pancreas: Unremarkable. No pancreatic ductal dilatation or  surrounding inflammatory changes. Spleen: Normal in size without significant abnormality. Adrenals/Urinary Tract: Stable, benign, small fatty attenuation adrenal adenomata bilaterally (series 2, image 17). Small nonobstructive calculus of the inferior pole of the left kidney. No hydronephrosis. Low-attenuation lesion of the posterior midportion of the right kidney, likely a cyst although incompletely characterized. Thickening of the urinary bladder, likely due to chronic outlet obstruction Stomach/Bowel: Stomach is within normal limits. Appendix appears normal. No evidence of bowel wall thickening, distention, or inflammatory changes. Vascular/Lymphatic: Aortic atherosclerosis. No enlarged abdominal or pelvic lymph nodes. Reproductive: Prostatomegaly. Other: No abdominal wall hernia or abnormality. No abdominopelvic ascites. Musculoskeletal: No acute or significant osseous findings. IMPRESSION: 1. Spiculated perihilar right upper lobe mass is slightly diminished in size, measuring 2.4 x 2.0 cm, previously 2.6 x 2.4 cm when measured similarly. Findings are consistent with continued treatment response. 2. Unchanged prominent pretracheal lymph node measuring 1.6 x 0.8 cm. 3. No noncontrast evidence of metastatic disease in the abdomen or pelvis. 4. Nonobstructive left nephrolithiasis. 5. Prostatomegaly with thickening of the urinary bladder, likely due to chronic outlet obstruction. 6. Coronary artery disease. Aortic Atherosclerosis (ICD10-I70.0). Electronically Signed   By: Eddie Candle M.D.   On: 03/02/2021 12:37    ASSESSMENT AND PLAN: This is a very pleasant 65 years old African-American male recently diagnosed with a stage IV non-small cell lung cancer, adenocarcinoma with no actionable mutations presented with large right lower lobe lung mass in addition to mediastinal and right supraclavicular lymphadenopathy as well as bilateral pulmonary nodules diagnosed in February 2021. I explained to the patient that  he has incurable condition and all the treatment options will be of palliative nature. The patient has no actionable mutations on the recent molecular studies and he is not a candidate for the [facility] clinical trial. He started systemic chemotherapy with carboplatin, Alimta and Keytruda status post 19 cycles.  Starting from cycle #5 the patient is on maintenance treatment with Alimta and Keytruda every 3 weeks. The patient has been treated with single agent Keytruda the last few cycles because of the renal insufficiency. He has been tolerating this treatment well with no concerning adverse effects. He had repeat CT scan of the chest, abdomen pelvis performed recently.  I personally and independently reviewed the scan and discussed the result with the patient today. His scan showed no concerning findings for disease progression. I recommended for the patient to continue his current treatment with maintenance Keytruda and he will proceed with cycle #20 today. I will see him back for follow-up visit in 3 weeks for evaluation before the next cycle of his treatment. For the hypertension the patient was advised to monitor his blood pressure closely at home and take his medication as prescribed. For his renal insufficiency he is followed by his primary care physician and expected to have referral to  nephrology soon for management of his condition. The patient was advised to call immediately if he has any other concerning symptoms in the interval. The patient voices understanding of current disease status and treatment options and is in agreement with the current care plan.  All questions were answered. The patient knows to call the clinic with any problems, questions or concerns. We can certainly see the patient much sooner if necessary.  Disclaimer: This note was dictated with voice recognition software. Similar sounding words can inadvertently be transcribed and may not be corrected upon review.

## 2021-03-09 ENCOUNTER — Ambulatory Visit (INDEPENDENT_AMBULATORY_CARE_PROVIDER_SITE_OTHER): Payer: No Typology Code available for payment source | Admitting: Primary Care

## 2021-03-23 NOTE — Progress Notes (Signed)
[facility] OFFICE PROGRESS NOTE  [name], NP [place] [address]  DIAGNOSIS: Stage IV (T2b, N3, M1a) non-small cell lung cancer, adenocarcinoma presented with large right upper lobe lung mass in addition to mediastinal and right supraclavicular lymphadenopathy as well as bilateral pulmonary nodules diagnosed in February 2021.  MOLECULAR STUDY by [employer]:  KRASG12C,4.3%,Binimetinib  [id number],0.9%,Niraparib, Olaparib, Rucaparib,Talazoparib, Tazemetostat  [id number],8.9%,FYBO  PRIOR THERAPY: None  CURRENT THERAPY: Systemic chemotherapy with carboplatin for AUC of 5, Alimta 500 mg/M2 and Keytruda 200 mg IV every 3 weeks. First dose December 31, 2019.Status post14cycles. Starting from cycle #5, he will be on maintenance Alimta and Keytruda.Alimta was discontinued due to renal insufficiency.   INTERVAL HISTORY: Kevin Lambert. 65 y.o. male returns to the clinic today for a follow up visit. The patient is feeling well today without any concerning complaints. He is having financial hardships affording his rent and was thinking he may need to return to work to offset these challenges. He is wondering if he can have a referral to social work to help with recommendations for resources. The patient tolerated his last cycle of treatment well without any adverse side effects.Alimta was discontinued due renal insufficiency. The patient denies any recent fever, chills, or night sweats. His weigh is stable. He reports his baseline dyspnea on exertion for which he uses an inhaler.He notes his shortness of breath unchanged. He denies significant cough. He denies chest pain, or hemoptysis. He denies any nausea, vomiting, recent constipation, ordiarrhea. He denies any rashes or skin changes. He denies headaches or visual changes. The patient is here today for evaluation before starting cycle #21.  MEDICAL HISTORY: Past Medical  History:  Diagnosis Date  . Arthritis   . Chronic kidney disease   . Depression   . DM (diabetes mellitus) ([place])    type II  . Dyslipidemia   . Dyspnea    unable to walk much - he thinks it is from   . GERD (gastroesophageal reflux disease)   . Hepatitis    Hepatitis C- treated  . History of kidney stones   . HTN (hypertension)   . Neuropathy   . nscl ca dx'd 10/2019  . OSA on CPAP   . PTSD (post-traumatic stress disorder)     ALLERGIES:  is allergic to metformin and related, naproxen, and oxycontin [oxycodone].  MEDICATIONS:  Current Outpatient Medications  Medication Sig Dispense Refill  . amLODipine (NORVASC) 10 MG tablet Take 10 mg by mouth daily.    . Ascorbic Acid (VITAMIN C) 1000 MG tablet Take 1,000 mg by mouth daily.    Marland Kitchen aspirin EC 81 MG tablet Take 81 mg by mouth daily.    Marland Kitchen atorvastatin (LIPITOR) 10 MG tablet Take 10 mg by mouth daily.    . bisacodyl (DULCOLAX) 5 MG EC tablet Take 5 mg by mouth daily as needed for moderate constipation.    . carvedilol (COREG) 3.125 MG tablet Take 1 tablet (3.125 mg total) by mouth 2 (two) times daily with a meal. 180 tablet 0  . cholecalciferol (VITAMIN D3) 25 MCG (1000 UNIT) tablet Take 1,000 Units by mouth daily.    . Cyanocobalamin (VITAMIN B 12 PO) Take 1 tablet by mouth daily.    . dapagliflozin propanediol (FARXIGA) 10 MG TABS tablet Take 1 tablet (10 mg total) by mouth daily. 90 tablet 1  . folic acid (FOLVITE) 1 MG tablet Take 1 tablet (1 mg total) by mouth daily.  Pt needs to pick up today and start ASAP . He will pay out of pocket. He is waiting on the [place] to send his folic acid which will be next week. 7 tablet 0  . furosemide (LASIX) 20 MG tablet Take 1 tablet (20 mg total) by mouth daily. As needed 20 tablet 0  . lidocaine-prilocaine (EMLA) cream Apply to Port-A-Cath site 30-60-minute before treatment. 30 g 0  . magic mouthwash SOLN Take 5 mLs by mouth 4 (four) times daily as needed for mouth pain. Swish and swallow or  spit 240 mL 2  . meclizine (ANTIVERT) 12.5 MG tablet Take 1 tablet (12.5 mg total) by mouth 3 (three) times daily as needed for dizziness. 90 tablet 0  . nicotine (NICODERM CQ) 21 mg/24hr patch Place 1 patch (21 mg total) onto the skin daily. 28 patch 3  . nicotine polacrilex (COMMIT) 4 MG lozenge Take 1 lozenge (4 mg total) by mouth as needed for smoking cessation. 108 tablet 3  . potassium chloride SA (KLOR-CON) 20 MEQ tablet Take 1 tablet (20 mEq total) by mouth daily. 10 tablet 0  . pregabalin (LYRICA) 25 MG capsule Take 1 capsule (25 mg total) by mouth daily. 30 capsule 1  . Propylene Glycol (SYSTANE BALANCE OP) Place 1 drop into both eyes daily.    . sildenafil (VIAGRA) 100 MG tablet TAKE ONE TABLET BY MOUTH AS INSTRUCTED (TAKE 1 HOUR PRIOR TO SEXUAL ACTIVITY *DO NOT EXCEED 1 DOSE PER 24 HOUR PERIOD*)    . triamterene-hydrochlorothiazide (MAXZIDE-25) 37.5-25 MG tablet Take 1 tablet by mouth daily.    Marland Kitchen zinc gluconate 50 MG tablet Take 50 mg by mouth daily.     No current facility-administered medications for this visit.    SURGICAL HISTORY:  Past Surgical History:  Procedure Laterality Date  . BIOPSY OF MEDIASTINAL MASS  12/10/2019   Procedure: BIOPSY OF MEDIASTINAL MASS;  Surgeon: Garner Nash, DO;  Location: [facility];  Service: Pulmonary;;  right upper lobe  . BRONCHIAL NEEDLE ASPIRATION BIOPSY  12/10/2019   Procedure: BRONCHIAL NEEDLE ASPIRATION BIOPSIES;  Surgeon: Garner Nash, DO;  Location: [facility];  Service: Pulmonary;;  . COLONOSCOPY    . IR IMAGING GUIDED PORT INSERTION  12/31/2019  . kidney stone    . URETERAL STENT PLACEMENT    . Ureteral Stent Removed    . VIDEO BRONCHOSCOPY WITH ENDOBRONCHIAL ULTRASOUND N/A 12/10/2019   Procedure: VIDEO BRONCHOSCOPY WITH ENDOBRONCHIAL ULTRASOUND;  Surgeon: Garner Nash, DO;  Location: [facility];  Service: Pulmonary;  Laterality: N/A;    REVIEW OF SYSTEMS:   Constitutional:Positive for fatigue.Negative for appetite  change, chills, fever and unexpected weight change.  HENT:Negative for mouth sores, sore throat and trouble swallowing.  Eyes:Negative for eye problems and icterus.  Respiratory:Positive for baseline shortness of breath with exertion (stable/improved).Negative for cough, hemoptysis, and wheezing.  Cardiovascular:Negative for chest pain no lower extremity swelling. Gastrointestinal:Negative fordiarrhea,constipation,vomiting, ornausea. Genitourinary: Negative for bladder incontinence, difficulty urinating, dysuria, frequency and hematuria.  Musculoskeletal: Negative for back pain, gait problem, neck pain and neck stiffness.  Skin:negative for rash or itching.  Neurological: Negative for extremity weakness, gait problem, headaches, light-headedness and seizures.  Hematological: Negative for adenopathy. Does not bruise/bleed easily.  Psychiatric/Behavioral: Negative for confusion, depression and sleep disturbance. The patient is not nervous/anxious.   PHYSICAL EXAMINATION:  Blood pressure (!) 141/79, pulse 85, temperature (!) 97 F (36.1 C), temperature source Tympanic, resp. rate 18, height 5\' 9"  (1.753 m), weight 222 lb 8 oz (  100.9 kg), SpO2 100 %.  ECOG PERFORMANCE STATUS: 1 - Symptomatic but completely ambulatory  Physical Exam  Constitutional: Oriented to person, place, and time and well-developed, well-nourished, and in no distress.  HENT:  Head: Normocephalic and atraumatic.  Mouth/Throat: Oropharynx is clear and moist. No oropharyngeal exudate.  Eyes: Conjunctivae are normal. Right eye exhibits no discharge. Left eye exhibits no discharge. No scleral icterus.  Neck: Normal range of motion. Neck supple.  Cardiovascular: Normal rate, regular rhythm, normal heart sounds and intact distal pulses.   Pulmonary/Chest: Effort normal and breath sounds normal. No respiratory distress. No wheezes. No rales.  Abdominal: Soft. Bowel sounds are normal. Exhibits no distension and no  mass. There is no tenderness.  Musculoskeletal: Normal range of motion. Exhibits no edema.  Lymphadenopathy:    No cervical adenopathy.  Neurological: Alert and oriented to person, place, and time. Exhibits normal muscle tone. Gait normal. Coordination normal.  Skin: Skin is warm and dry. No rash noted. Not diaphoretic. No erythema. No pallor.  Psychiatric: Mood, memory and judgment normal.  Vitals reviewed.  LABORATORY DATA: Lab Results  Component Value Date   WBC 5.8 03/25/2021   HGB 12.8 (L) 03/25/2021   HCT 36.7 (L) 03/25/2021   MCV 92.4 03/25/2021   PLT 212 03/25/2021      Chemistry      Component Value Date/Time   NA 137 03/25/2021 0926   NA 138 01/08/2021 0955   K 4.3 03/25/2021 0926   CL 103 03/25/2021 0926   CO2 24 03/25/2021 0926   BUN 29 (H) 03/25/2021 0926   BUN 28 (H) 01/08/2021 0955   CREATININE 1.86 (H) 03/25/2021 0926      Component Value Date/Time   CALCIUM 10.2 03/25/2021 0926   ALKPHOS 70 03/25/2021 0926   AST 18 03/25/2021 0926   ALT 16 03/25/2021 0926   BILITOT 0.3 03/25/2021 0926       RADIOGRAPHIC STUDIES:  CT Abdomen Pelvis Wo Contrast  Result Date: 03/02/2021 CLINICAL DATA:  Non-small cell lung cancer staging EXAM: CT CHEST, ABDOMEN AND PELVIS WITHOUT CONTRAST TECHNIQUE: Multidetector CT imaging of the chest, abdomen and pelvis was performed following the standard protocol without IV contrast. Oral enteric contrast was administered. COMPARISON:  11/16/2020 FINDINGS: CT CHEST FINDINGS Cardiovascular: Right chest port catheter. Aortic atherosclerosis. Normal heart size. Three-vessel coronary artery calcifications. No pericardial effusion. Mediastinum/Nodes: Unchanged prominent pretracheal lymph node measuring 1.6 x 0.8 cm (series 2, image 47). Thyroid gland, trachea, and esophagus demonstrate no significant findings. Lungs/Pleura: Spiculated perihilar right upper lobe mass is slightly diminished in size, measuring 2.4 x 2.0 cm, previously 2.6 x 2.4  cm when measured similarly (series 4, image 58). No pleural effusion or pneumothorax. Musculoskeletal: No chest wall mass or suspicious bone lesions identified. CT ABDOMEN PELVIS FINDINGS Hepatobiliary: No solid liver abnormality is seen. No gallstones, gallbladder wall thickening, or biliary dilatation. Pancreas: Unremarkable. No pancreatic ductal dilatation or surrounding inflammatory changes. Spleen: Normal in size without significant abnormality. Adrenals/Urinary Tract: Stable, benign, small fatty attenuation adrenal adenomata bilaterally (series 2, image 17). Small nonobstructive calculus of the inferior pole of the left kidney. No hydronephrosis. Low-attenuation lesion of the posterior midportion of the right kidney, likely a cyst although incompletely characterized. Thickening of the urinary bladder, likely due to chronic outlet obstruction Stomach/Bowel: Stomach is within normal limits. Appendix appears normal. No evidence of bowel wall thickening, distention, or inflammatory changes. Vascular/Lymphatic: Aortic atherosclerosis. No enlarged abdominal or pelvic lymph nodes. Reproductive: Prostatomegaly. Other: No abdominal wall hernia  or abnormality. No abdominopelvic ascites. Musculoskeletal: No acute or significant osseous findings. IMPRESSION: 1. Spiculated perihilar right upper lobe mass is slightly diminished in size, measuring 2.4 x 2.0 cm, previously 2.6 x 2.4 cm when measured similarly. Findings are consistent with continued treatment response. 2. Unchanged prominent pretracheal lymph node measuring 1.6 x 0.8 cm. 3. No noncontrast evidence of metastatic disease in the abdomen or pelvis. 4. Nonobstructive left nephrolithiasis. 5. Prostatomegaly with thickening of the urinary bladder, likely due to chronic outlet obstruction. 6. Coronary artery disease. Aortic Atherosclerosis (ICD10-I70.0). Electronically Signed   By: Eddie Candle M.D.   On: 03/02/2021 12:37   CT Chest Wo Contrast  Result Date:  03/02/2021 CLINICAL DATA:  Non-small cell lung cancer staging EXAM: CT CHEST, ABDOMEN AND PELVIS WITHOUT CONTRAST TECHNIQUE: Multidetector CT imaging of the chest, abdomen and pelvis was performed following the standard protocol without IV contrast. Oral enteric contrast was administered. COMPARISON:  11/16/2020 FINDINGS: CT CHEST FINDINGS Cardiovascular: Right chest port catheter. Aortic atherosclerosis. Normal heart size. Three-vessel coronary artery calcifications. No pericardial effusion. Mediastinum/Nodes: Unchanged prominent pretracheal lymph node measuring 1.6 x 0.8 cm (series 2, image 47). Thyroid gland, trachea, and esophagus demonstrate no significant findings. Lungs/Pleura: Spiculated perihilar right upper lobe mass is slightly diminished in size, measuring 2.4 x 2.0 cm, previously 2.6 x 2.4 cm when measured similarly (series 4, image 58). No pleural effusion or pneumothorax. Musculoskeletal: No chest wall mass or suspicious bone lesions identified. CT ABDOMEN PELVIS FINDINGS Hepatobiliary: No solid liver abnormality is seen. No gallstones, gallbladder wall thickening, or biliary dilatation. Pancreas: Unremarkable. No pancreatic ductal dilatation or surrounding inflammatory changes. Spleen: Normal in size without significant abnormality. Adrenals/Urinary Tract: Stable, benign, small fatty attenuation adrenal adenomata bilaterally (series 2, image 17). Small nonobstructive calculus of the inferior pole of the left kidney. No hydronephrosis. Low-attenuation lesion of the posterior midportion of the right kidney, likely a cyst although incompletely characterized. Thickening of the urinary bladder, likely due to chronic outlet obstruction Stomach/Bowel: Stomach is within normal limits. Appendix appears normal. No evidence of bowel wall thickening, distention, or inflammatory changes. Vascular/Lymphatic: Aortic atherosclerosis. No enlarged abdominal or pelvic lymph nodes. Reproductive: Prostatomegaly. Other:  No abdominal wall hernia or abnormality. No abdominopelvic ascites. Musculoskeletal: No acute or significant osseous findings. IMPRESSION: 1. Spiculated perihilar right upper lobe mass is slightly diminished in size, measuring 2.4 x 2.0 cm, previously 2.6 x 2.4 cm when measured similarly. Findings are consistent with continued treatment response. 2. Unchanged prominent pretracheal lymph node measuring 1.6 x 0.8 cm. 3. No noncontrast evidence of metastatic disease in the abdomen or pelvis. 4. Nonobstructive left nephrolithiasis. 5. Prostatomegaly with thickening of the urinary bladder, likely due to chronic outlet obstruction. 6. Coronary artery disease. Aortic Atherosclerosis (ICD10-I70.0). Electronically Signed   By: Eddie Candle M.D.   On: 03/02/2021 12:37     ASSESSMENT/PLAN:  This is a very pleasant 65 year old African-American male diagnosed with stage IV non-small cell lung cancer, adenocarcinoma. He presented with a large right upper lobe lung mass with right hilar, subcarinal, paratracheal lymphadenopathy,and right supraclavicular lymphadenopathy. He also presented with bilateral pulmonary nodules. He was diagnosed in February 2021. He does not have any actionable mutations.    The patient is currently undergoing palliative systemic chemotherapy with carboplatin for an AUC of 5, Alimta 500 mg/m, Keytruda 200 mg IV every 3 weeks. He is status post14cyclesand he tolerated it well without any adverse side effects. Starting from cycle #5, the patient has been on maintenance alimta and [place].Alimta  was ultimately discontinued due to renal insufficiency.   Labs were reviewed. Recommend that he proceed with cycle #21 today with single agent Keytruda.   We will see him back for a follow up appointment in 3 weeks for evaluation before starting cycle #22.   I have placed a referral to social work due to his financial hardships.   Reviewed his CKD with him. Encouraged good diabetes and  blood pressure control and the importance of being compliant with these medications.   The patient was advised to call immediately if he has any concerning symptoms in the interval. The patient voices understanding of current disease status and treatment options and is in agreement with the current care plan. All questions were answered. The patient knows to call the clinic with any problems, questions or concerns. We can certainly see the patient much sooner if necessary       Orders Placed This Encounter  Procedures  . Ambulatory referral to Social Work    Referral Priority:   Routine    Referral Type:   Consultation    Referral Reason:   Specialty Services Required    Number of Visits Requested:   1     I spent 20-29 minutes in this encounter.   Leocadia Idleman L Ramonia Mcclaran, PA-C 03/25/21

## 2021-03-25 ENCOUNTER — Inpatient Hospital Stay: Payer: No Typology Code available for payment source

## 2021-03-25 ENCOUNTER — Other Ambulatory Visit: Payer: Self-pay

## 2021-03-25 ENCOUNTER — Inpatient Hospital Stay: Payer: No Typology Code available for payment source | Attending: Internal Medicine | Admitting: Physician Assistant

## 2021-03-25 ENCOUNTER — Encounter: Payer: Self-pay | Admitting: General Practice

## 2021-03-25 ENCOUNTER — Other Ambulatory Visit: Payer: No Typology Code available for payment source

## 2021-03-25 ENCOUNTER — Encounter: Payer: Self-pay | Admitting: Physician Assistant

## 2021-03-25 VITALS — BP 141/79 | HR 85 | Temp 97.0°F | Resp 18 | Ht 69.0 in | Wt 222.5 lb

## 2021-03-25 DIAGNOSIS — C3491 Malignant neoplasm of unspecified part of right bronchus or lung: Secondary | ICD-10-CM | POA: Diagnosis not present

## 2021-03-25 DIAGNOSIS — Z79899 Other long term (current) drug therapy: Secondary | ICD-10-CM | POA: Insufficient documentation

## 2021-03-25 DIAGNOSIS — C3411 Malignant neoplasm of upper lobe, right bronchus or lung: Secondary | ICD-10-CM | POA: Diagnosis present

## 2021-03-25 DIAGNOSIS — Z95828 Presence of other vascular implants and grafts: Secondary | ICD-10-CM

## 2021-03-25 DIAGNOSIS — Z5112 Encounter for antineoplastic immunotherapy: Secondary | ICD-10-CM | POA: Insufficient documentation

## 2021-03-25 DIAGNOSIS — E1122 Type 2 diabetes mellitus with diabetic chronic kidney disease: Secondary | ICD-10-CM | POA: Insufficient documentation

## 2021-03-25 DIAGNOSIS — N189 Chronic kidney disease, unspecified: Secondary | ICD-10-CM | POA: Diagnosis not present

## 2021-03-25 LAB — CBC WITH DIFFERENTIAL (CANCER CENTER ONLY)
Abs Immature Granulocytes: 0.02 10*3/uL (ref 0.00–0.07)
Basophils Absolute: 0 10*3/uL (ref 0.0–0.1)
Basophils Relative: 1 %
Eosinophils Absolute: 0.3 10*3/uL (ref 0.0–0.5)
Eosinophils Relative: 5 %
HCT: 36.7 % — ABNORMAL LOW (ref 39.0–52.0)
Hemoglobin: 12.8 g/dL — ABNORMAL LOW (ref 13.0–17.0)
Immature Granulocytes: 0 %
Lymphocytes Relative: 35 %
Lymphs Abs: 2 10*3/uL (ref 0.7–4.0)
MCH: 32.2 pg (ref 26.0–34.0)
MCHC: 34.9 g/dL (ref 30.0–36.0)
MCV: 92.4 fL (ref 80.0–100.0)
Monocytes Absolute: 0.6 10*3/uL (ref 0.1–1.0)
Monocytes Relative: 11 %
Neutro Abs: 2.8 10*3/uL (ref 1.7–7.7)
Neutrophils Relative %: 48 %
Platelet Count: 212 10*3/uL (ref 150–400)
RBC: 3.97 MIL/uL — ABNORMAL LOW (ref 4.22–5.81)
RDW: 14.1 % (ref 11.5–15.5)
WBC Count: 5.8 10*3/uL (ref 4.0–10.5)
nRBC: 0 % (ref 0.0–0.2)

## 2021-03-25 LAB — CMP (CANCER CENTER ONLY)
ALT: 16 U/L (ref 0–44)
AST: 18 U/L (ref 15–41)
Albumin: 3.5 g/dL (ref 3.5–5.0)
Alkaline Phosphatase: 70 U/L (ref 38–126)
Anion gap: 10 (ref 5–15)
BUN: 29 mg/dL — ABNORMAL HIGH (ref 8–23)
CO2: 24 mmol/L (ref 22–32)
Calcium: 10.2 mg/dL (ref 8.9–10.3)
Chloride: 103 mmol/L (ref 98–111)
Creatinine: 1.86 mg/dL — ABNORMAL HIGH (ref 0.61–1.24)
GFR, Estimated: 40 mL/min — ABNORMAL LOW (ref >60)
Glucose, Bld: 149 mg/dL — ABNORMAL HIGH (ref 70–99)
Potassium: 4.3 mmol/L (ref 3.5–5.1)
Sodium: 137 mmol/L (ref 135–145)
Total Bilirubin: 0.3 mg/dL (ref 0.3–1.2)
Total Protein: 6.7 g/dL (ref 6.5–8.1)

## 2021-03-25 LAB — TSH: TSH: 1.059 u[IU]/mL (ref 0.320–4.118)

## 2021-03-25 MED ORDER — SODIUM CHLORIDE 0.9 % IV SOLN
200.0000 mg | Freq: Once | INTRAVENOUS | Status: AC
  Administered 2021-03-25: 200 mg via INTRAVENOUS
  Filled 2021-03-25: qty 8

## 2021-03-25 MED ORDER — SODIUM CHLORIDE 0.9% FLUSH
10.0000 mL | INTRAVENOUS | Status: DC | PRN
Start: 2021-03-25 — End: 2021-03-25
  Filled 2021-03-25: qty 10

## 2021-03-25 MED ORDER — SODIUM CHLORIDE 0.9 % IV SOLN
Freq: Once | INTRAVENOUS | Status: AC
Start: 2021-03-25 — End: 2021-03-25
  Filled 2021-03-25: qty 250

## 2021-03-25 MED ORDER — SODIUM CHLORIDE 0.9% FLUSH
10.0000 mL | Freq: Once | INTRAVENOUS | Status: AC
  Administered 2021-03-25: 10 mL
  Filled 2021-03-25: qty 10

## 2021-03-25 MED ORDER — HEPARIN SOD (PORK) LOCK FLUSH 100 UNIT/ML IV SOLN
500.0000 [IU] | Freq: Once | INTRAVENOUS | Status: DC | PRN
  Filled 2021-03-25: qty 5

## 2021-03-25 NOTE — Progress Notes (Signed)
[facility] Work  Initial Assessment   Kevin Lambert. is a 65 y.o. year old male contacted by phone. Clinical Social Work was referred by medical oncology treatment team for assessment of psychosocial needs.   SDOH (Social Determinants of Health) assessments performed: Yes SDOH Interventions   Flowsheet Row Most Recent Value  SDOH Interventions   Food Insecurity Interventions Other (Comment)  [Advised him about [facility] [employer]]  Financial Strain Interventions Print production planner Interventions [employer]      Distress Screen completed: No No flowsheet data found.    Family/Social Information:  . Housing Arrangement: patient lives alone . Family members/support persons in your life? Family and friends help as needed, has sisters who are concerned about . Transportation concerns: Engineer, civil (consulting) and VA resources  . Employment: Unemployed, last worked in Feb 2021, used to work as Psychologist, counselling at [employer].  Is of retirement age but needed to supplement his income with part time work in order to pay bills etc.   .   Income source: [place], Has Advertising account executive, has used half his grant already . Financial concerns: Yes, due to illness and/or loss of work during treatment o Type of concern: Utilities and Rent/ mortgage . Food access concerns: Physicist, medical . Religious or spiritual practice: none at this time . Medication Concerns: none at this time Services Currently in place:  [employer], [place]; has had government assistance (ERAP program?), to pay his rent for past several months, his apartment complex will no longer accept these government funds going forward Coping/ Adjustment to diagnosis: . Patient understands treatment plan and what happens next? Diagnosed with Stage IV lung cancer.   . Concerns about diagnosis and/or treatment: general financial concerns . Patient reported stressors: Housing, Finances and Adjusting to my  illness, very stressed about his finances, paying rent takes the majority of his Social Security income, rent will increase next month as well  . Hopes and priorities: affordable housing, more income, obtain any veterans benefits available to him . Current coping skills/ strengths: Supportive family/friends    SUMMARY: Current SDOH Barriers:  . Financial constraints related to loss of work due to impact of lung cancer and its treatment  Clinical Social Work Clinical Goal(s):  . patient will work with SW to address concerns related to housing and financial insecurity, will be referred to [facility]. Encouraged him also to continue to pursue process of applying for veterans benefits  Interventions: . Discussed common feeling and emotions when being diagnosed with cancer, and the importance of support during treatment . Informed patient of the support team roles and support services at [facility] . Provided CSW contact information and encouraged patient to call with any questions or concerns . Referred patient to [facility]   Follow Up Plan: CSW will follow-up with patient by phone  Patient verbalizes understanding of plan: Yes    Beverely Pace , [place], LCSW Clinical Social Worker Phone:  [phone]

## 2021-03-26 ENCOUNTER — Encounter: Payer: Self-pay | Admitting: General Practice

## 2021-03-26 NOTE — Progress Notes (Signed)
[place] suggested patient call Healthcare for [facility] hotline at [place] [phone],  This agency can investigate his situatoin and offer any VA support available to him.  Called patient and provided this referral, encouraged him to call back and let us know how things are going for him.  Edwyna Shell, LCSW Clinical Social Worker Phone:  [phone]

## 2021-03-26 NOTE — Progress Notes (Signed)
[facility] CSW Progress Notes  Call from patient.  He wants to follow up on yesterdays phone conversation about his needs for help locating affordable housing as his rent is due to increase soon.  He cannot afford the increased rent.  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  Agency asked to reach out directly to patient as he has consented to their involvement and receiving his contact information.  Sent him list of income based properties via [place] [employer] today.  Messaged CSW [place] and asked her to follow up w patient while undersigned is out of the office.  Edwyna Shell, LCSW Clinical Social Worker Phone:  [phone]

## 2021-03-30 ENCOUNTER — Encounter: Payer: Self-pay | Admitting: *Deleted

## 2021-03-30 NOTE — Progress Notes (Signed)
[facility] Work  Clinical Social Work contacted patient at home to follow up on housing concerns and resources.  Patient stated he had not called the [place] homeless vet hotline.  Patient stated he was not aware he needed to call, and was under the impression CSW team had made the referral.  CSW clarified that a referra to [facility] was made, but patient needed to call the [place].  CSW provided patient with the [facility] hotline number [phone]), and patient stated he would call today.  CSW encouraged patient to call back to provide update or with questions.   Johnnye Lana, MSW, LCSW, OSW-C Clinical Social Worker [facility] [phone]

## 2021-04-01 ENCOUNTER — Telehealth: Payer: Self-pay | Admitting: Medical Oncology

## 2021-04-01 NOTE — Telephone Encounter (Signed)
Homeless vsGoing back to work- If he cannot do his job "-lifting up to 25 lbs"  he will lose his job and his apt. And become homeless.  He left 2 messages to [facility] and has not heard back from them .   He is asking Dr Julien Nordmann to approve him lifting up to 25 lbs.   I told him to keep appt 6/9 to discuss with [facility].

## 2021-04-06 ENCOUNTER — Encounter: Payer: Self-pay | Admitting: Internal Medicine

## 2021-04-15 ENCOUNTER — Inpatient Hospital Stay: Payer: No Typology Code available for payment source | Attending: Internal Medicine | Admitting: Internal Medicine

## 2021-04-15 ENCOUNTER — Inpatient Hospital Stay: Payer: No Typology Code available for payment source

## 2021-04-15 ENCOUNTER — Other Ambulatory Visit: Payer: Self-pay

## 2021-04-15 ENCOUNTER — Other Ambulatory Visit: Payer: No Typology Code available for payment source

## 2021-04-15 ENCOUNTER — Inpatient Hospital Stay: Payer: No Typology Code available for payment source | Admitting: General Practice

## 2021-04-15 VITALS — BP 140/72 | HR 66 | Temp 97.5°F | Resp 20 | Ht 69.0 in | Wt 224.9 lb

## 2021-04-15 DIAGNOSIS — I129 Hypertensive chronic kidney disease with stage 1 through stage 4 chronic kidney disease, or unspecified chronic kidney disease: Secondary | ICD-10-CM | POA: Insufficient documentation

## 2021-04-15 DIAGNOSIS — C3491 Malignant neoplasm of unspecified part of right bronchus or lung: Secondary | ICD-10-CM

## 2021-04-15 DIAGNOSIS — Z5112 Encounter for antineoplastic immunotherapy: Secondary | ICD-10-CM

## 2021-04-15 DIAGNOSIS — E1122 Type 2 diabetes mellitus with diabetic chronic kidney disease: Secondary | ICD-10-CM | POA: Diagnosis not present

## 2021-04-15 DIAGNOSIS — I10 Essential (primary) hypertension: Secondary | ICD-10-CM | POA: Diagnosis not present

## 2021-04-15 DIAGNOSIS — Z79899 Other long term (current) drug therapy: Secondary | ICD-10-CM | POA: Diagnosis not present

## 2021-04-15 DIAGNOSIS — N189 Chronic kidney disease, unspecified: Secondary | ICD-10-CM | POA: Insufficient documentation

## 2021-04-15 DIAGNOSIS — Z95828 Presence of other vascular implants and grafts: Secondary | ICD-10-CM

## 2021-04-15 DIAGNOSIS — C3490 Malignant neoplasm of unspecified part of unspecified bronchus or lung: Secondary | ICD-10-CM

## 2021-04-15 DIAGNOSIS — C3411 Malignant neoplasm of upper lobe, right bronchus or lung: Secondary | ICD-10-CM | POA: Diagnosis present

## 2021-04-15 LAB — CBC WITH DIFFERENTIAL (CANCER CENTER ONLY)
Abs Immature Granulocytes: 0.03 10*3/uL (ref 0.00–0.07)
Basophils Absolute: 0.1 10*3/uL (ref 0.0–0.1)
Basophils Relative: 1 %
Eosinophils Absolute: 0.4 10*3/uL (ref 0.0–0.5)
Eosinophils Relative: 6 %
HCT: 35 % — ABNORMAL LOW (ref 39.0–52.0)
Hemoglobin: 12.4 g/dL — ABNORMAL LOW (ref 13.0–17.0)
Immature Granulocytes: 1 %
Lymphocytes Relative: 36 %
Lymphs Abs: 2.1 10*3/uL (ref 0.7–4.0)
MCH: 32.8 pg (ref 26.0–34.0)
MCHC: 35.4 g/dL (ref 30.0–36.0)
MCV: 92.6 fL (ref 80.0–100.0)
Monocytes Absolute: 0.6 10*3/uL (ref 0.1–1.0)
Monocytes Relative: 11 %
Neutro Abs: 2.6 10*3/uL (ref 1.7–7.7)
Neutrophils Relative %: 45 %
Platelet Count: 205 10*3/uL (ref 150–400)
RBC: 3.78 MIL/uL — ABNORMAL LOW (ref 4.22–5.81)
RDW: 13.9 % (ref 11.5–15.5)
WBC Count: 5.7 10*3/uL (ref 4.0–10.5)
nRBC: 0 % (ref 0.0–0.2)

## 2021-04-15 LAB — CMP (CANCER CENTER ONLY)
ALT: 20 U/L (ref 0–44)
AST: 15 U/L (ref 15–41)
Albumin: 3.4 g/dL — ABNORMAL LOW (ref 3.5–5.0)
Alkaline Phosphatase: 60 U/L (ref 38–126)
Anion gap: 10 (ref 5–15)
BUN: 28 mg/dL — ABNORMAL HIGH (ref 8–23)
CO2: 24 mmol/L (ref 22–32)
Calcium: 9.8 mg/dL (ref 8.9–10.3)
Chloride: 103 mmol/L (ref 98–111)
Creatinine: 1.87 mg/dL — ABNORMAL HIGH (ref 0.61–1.24)
GFR, Estimated: 39 mL/min — ABNORMAL LOW (ref >60)
Glucose, Bld: 117 mg/dL — ABNORMAL HIGH (ref 70–99)
Potassium: 4.5 mmol/L (ref 3.5–5.1)
Sodium: 137 mmol/L (ref 135–145)
Total Bilirubin: 0.3 mg/dL (ref 0.3–1.2)
Total Protein: 6.6 g/dL (ref 6.5–8.1)

## 2021-04-15 LAB — TSH: TSH: 1.187 u[IU]/mL (ref 0.320–4.118)

## 2021-04-15 MED ORDER — SODIUM CHLORIDE 0.9% FLUSH
10.0000 mL | Freq: Once | INTRAVENOUS | Status: AC
Start: 2021-04-15 — End: 2021-04-15
  Administered 2021-04-15: 10 mL
  Filled 2021-04-15: qty 10

## 2021-04-15 MED ORDER — SODIUM CHLORIDE 0.9 % IV SOLN
Freq: Once | INTRAVENOUS | Status: AC
  Filled 2021-04-15: qty 250

## 2021-04-15 MED ORDER — SODIUM CHLORIDE 0.9 % IV SOLN
200.0000 mg | Freq: Once | INTRAVENOUS | Status: AC
  Administered 2021-04-15: 200 mg via INTRAVENOUS
  Filled 2021-04-15: qty 8

## 2021-04-15 NOTE — Progress Notes (Signed)
Ok to treat per Dr with elevated Scr.

## 2021-04-15 NOTE — Progress Notes (Signed)
[facility] [phone]   [phone]  OFFICE PROGRESS NOTE  [name], NP [place] [address]  DIAGNOSIS: Stage IV (T2b, N3, M1a) non-small cell lung cancer, adenocarcinoma presented with large right upper lobe lung mass in addition to mediastinal and right supraclavicular lymphadenopathy as well as bilateral pulmonary nodules diagnosed in February 2021.  MOLECULAR STUDY by [employer]:  KRASG12C, 4.3%, Binimetinib  [id number], 0.9%, Niraparib, Olaparib, Rucaparib,Talazoparib, Tazemetostat  [id number], 1.7%, None   PRIOR THERAPY: None  CURRENT THERAPY: Systemic chemotherapy with carboplatin for AUC of 5, Alimta 500 mg/M2 and Keytruda 200 mg IV every 3 weeks.  First dose December 31, 2019.  Status post 21 cycles.  The last few cycles he has been treated with single agent Keytruda secondary to renal insufficiency.  INTERVAL HISTORY: Shafer Swamy. 65 y.o. male returns to the clinic today for follow-up visit.  The patient is feeling fine today with no concerning complaints.  He denied having any current chest pain, shortness of breath except with exertion with no cough or hemoptysis.  He denied having any fever or chills.  He has no nausea, vomiting, diarrhea or constipation.  He denied having any headache or visual changes.  The patient is here today for evaluation before starting cycle #22 of his treatment.  MEDICAL HISTORY: Past Medical History:  Diagnosis Date   Arthritis    Chronic kidney disease    Depression    DM (diabetes mellitus) ([place])    type II   Dyslipidemia    Dyspnea    unable to walk much - he thinks it is from    GERD (gastroesophageal reflux disease)    Hepatitis    Hepatitis C- treated   History of kidney stones    HTN (hypertension)    Neuropathy    nscl ca dx'd 10/2019   OSA on CPAP    PTSD (post-traumatic stress disorder)     ALLERGIES:  is allergic to metformin and  related, naproxen, and oxycontin [oxycodone].  MEDICATIONS:  Current Outpatient Medications  Medication Sig Dispense Refill   amLODipine (NORVASC) 10 MG tablet Take 10 mg by mouth daily.     Ascorbic Acid (VITAMIN C) 1000 MG tablet Take 1,000 mg by mouth daily.     aspirin EC 81 MG tablet Take 81 mg by mouth daily.     atorvastatin (LIPITOR) 10 MG tablet Take 10 mg by mouth daily.     bisacodyl (DULCOLAX) 5 MG EC tablet Take 5 mg by mouth daily as needed for moderate constipation.     carvedilol (COREG) 3.125 MG tablet Take 1 tablet (3.125 mg total) by mouth 2 (two) times daily with a meal. 180 tablet 0   cholecalciferol (VITAMIN D3) 25 MCG (1000 UNIT) tablet Take 1,000 Units by mouth daily.     Cyanocobalamin (VITAMIN B 12 PO) Take 1 tablet by mouth daily.     dapagliflozin propanediol (FARXIGA) 10 MG TABS tablet Take 1 tablet (10 mg total) by mouth daily. 90 tablet 1   folic acid (FOLVITE) 1 MG tablet Take 1 tablet (1 mg total) by mouth daily. Pt needs to pick up today and start ASAP . He will pay out of pocket. He is waiting on the [place] to send his folic acid which will be next week. 7 tablet 0   furosemide (LASIX) 20 MG tablet Take 1 tablet (20 mg total) by mouth daily. As needed 20  tablet 0   lidocaine-prilocaine (EMLA) cream Apply to Port-A-Cath site 30-60-minute before treatment. 30 g 0   magic mouthwash SOLN Take 5 mLs by mouth 4 (four) times daily as needed for mouth pain. Swish and swallow or spit 240 mL 2   meclizine (ANTIVERT) 12.5 MG tablet Take 1 tablet (12.5 mg total) by mouth 3 (three) times daily as needed for dizziness. 90 tablet 0   nicotine (NICODERM CQ) 21 mg/24hr patch Place 1 patch (21 mg total) onto the skin daily. 28 patch 3   nicotine polacrilex (COMMIT) 4 MG lozenge Take 1 lozenge (4 mg total) by mouth as needed for smoking cessation. 108 tablet 3   potassium chloride SA (KLOR-CON) 20 MEQ tablet Take 1 tablet (20 mEq total) by mouth daily. 10 tablet 0   pregabalin  (LYRICA) 25 MG capsule Take 1 capsule (25 mg total) by mouth daily. 30 capsule 1   Propylene Glycol (SYSTANE BALANCE OP) Place 1 drop into both eyes daily.     sildenafil (VIAGRA) 100 MG tablet TAKE ONE TABLET BY MOUTH AS INSTRUCTED (TAKE 1 HOUR PRIOR TO SEXUAL ACTIVITY *DO NOT EXCEED 1 DOSE PER 24 HOUR PERIOD*)     triamterene-hydrochlorothiazide (MAXZIDE-25) 37.5-25 MG tablet Take 1 tablet by mouth daily.     zinc gluconate 50 MG tablet Take 50 mg by mouth daily.     No current facility-administered medications for this visit.    SURGICAL HISTORY:  Past Surgical History:  Procedure Laterality Date   BIOPSY OF MEDIASTINAL MASS  12/10/2019   Procedure: BIOPSY OF MEDIASTINAL MASS;  Surgeon: Garner Nash, DO;  Location: [facility];  Service: Pulmonary;;  right upper lobe   BRONCHIAL NEEDLE ASPIRATION BIOPSY  12/10/2019   Procedure: BRONCHIAL NEEDLE ASPIRATION BIOPSIES;  Surgeon: Garner Nash, DO;  Location: [facility];  Service: Pulmonary;;   COLONOSCOPY     IR IMAGING GUIDED PORT INSERTION  12/31/2019   kidney stone     URETERAL STENT PLACEMENT     Ureteral Stent Removed     VIDEO BRONCHOSCOPY WITH ENDOBRONCHIAL ULTRASOUND N/A 12/10/2019   Procedure: VIDEO BRONCHOSCOPY WITH ENDOBRONCHIAL ULTRASOUND;  Surgeon: Garner Nash, DO;  Location: [facility];  Service: Pulmonary;  Laterality: N/A;    REVIEW OF SYSTEMS:  A comprehensive review of systems was negative except for: Constitutional: positive for fatigue Respiratory: positive for dyspnea on exertion   PHYSICAL EXAMINATION: General appearance: alert, cooperative, fatigued, and no distress Head: Normocephalic, without obvious abnormality, atraumatic Neck: no adenopathy, no JVD, supple, symmetrical, trachea midline, and thyroid not enlarged, symmetric, no tenderness/mass/nodules Lymph nodes: Cervical, supraclavicular, and axillary nodes normal. Resp: clear to auscultation bilaterally Back: symmetric, no curvature. ROM normal.  No CVA tenderness. Cardio: regular rate and rhythm, S1, S2 normal, no murmur, click, rub or gallop GI: soft, non-tender; bowel sounds normal; no masses,  no organomegaly Extremities: extremities normal, atraumatic, no cyanosis or edema  ECOG PERFORMANCE STATUS: 1 - Symptomatic but completely ambulatory  Blood pressure 140/72, pulse 66, temperature (!) 97.5 F (36.4 C), temperature source Tympanic, resp. rate 20, height 5\' 9"  (1.753 m), weight 224 lb 14.4 oz (102 kg), SpO2 100 %.  LABORATORY DATA: Lab Results  Component Value Date   WBC 5.7 04/15/2021   HGB 12.4 (L) 04/15/2021   HCT 35.0 (L) 04/15/2021   MCV 92.6 04/15/2021   PLT 205 04/15/2021      Chemistry      Component Value Date/Time   NA 137 03/25/2021 0926  NA 138 01/08/2021 0955   K 4.3 03/25/2021 0926   CL 103 03/25/2021 0926   CO2 24 03/25/2021 0926   BUN 29 (H) 03/25/2021 0926   BUN 28 (H) 01/08/2021 0955   CREATININE 1.86 (H) 03/25/2021 0926      Component Value Date/Time   CALCIUM 10.2 03/25/2021 0926   ALKPHOS 70 03/25/2021 0926   AST 18 03/25/2021 0926   ALT 16 03/25/2021 0926   BILITOT 0.3 03/25/2021 0926       RADIOGRAPHIC STUDIES: No results found.   ASSESSMENT AND PLAN: This is a very pleasant 65 years old African-American male recently diagnosed with a stage IV non-small cell lung cancer, adenocarcinoma with no actionable mutations presented with large right lower lobe lung mass in addition to mediastinal and right supraclavicular lymphadenopathy as well as bilateral pulmonary nodules diagnosed in February 2021. I explained to the patient that he has incurable condition and all the treatment options will be of palliative nature. The patient has no actionable mutations on the recent molecular studies and he is not a candidate for the [facility] clinical trial. He started systemic chemotherapy with carboplatin, Alimta and Keytruda status post 21 cycles.  Starting from cycle #5 the patient is on  maintenance treatment with Alimta and Keytruda every 3 weeks. The patient has been treated with single agent Keytruda the last few cycles because of the renal insufficiency. Has been tolerating this treatment well with no concerning adverse effects except for mild fatigue. I recommended for him to proceed with cycle #22 today as planned. For the hypertension, he will continue to monitor his blood pressure closely and take his medication as prescribed. For the renal insufficiency he is followed by his primary care physician. The patient will come back for follow-up visit in 3 weeks for evaluation before the next cycle of his treatment. He was advised to call immediately if he has any concerning symptoms in the interval. The patient voices understanding of current disease status and treatment options and is in agreement with the current care plan.  All questions were answered. The patient knows to call the clinic with any problems, questions or concerns. We can certainly see the patient much sooner if necessary.  Disclaimer: This note was dictated with voice recognition software. Similar sounding words can inadvertently be transcribed and may not be corrected upon review.

## 2021-04-15 NOTE — Progress Notes (Signed)
[place] CSW Progress Notes  Met w patient in infusion, reviewed progress on housing stability concerns.  He has contacted the Housing for [employer] team at [facility] [phone]).  He needs to provide Social Security statement and bank statements.  Once received, she can begin to assist him with his housing needs.  Patient states that he will bring these documents to [facility] so CSW can scan/email to caseworker ([email or web address]).  Spoke w caseworker and confirmed this plan - she will meet with him in person once documents are received.  Edwyna Shell, LCSW Clinical Social Worker Phone:  [phone]

## 2021-04-28 ENCOUNTER — Ambulatory Visit (INDEPENDENT_AMBULATORY_CARE_PROVIDER_SITE_OTHER): Payer: No Typology Code available for payment source | Admitting: Nurse Practitioner

## 2021-04-28 ENCOUNTER — Encounter: Payer: Self-pay | Admitting: Nurse Practitioner

## 2021-04-28 ENCOUNTER — Other Ambulatory Visit: Payer: Self-pay

## 2021-04-28 VITALS — BP 138/76 | HR 66 | Temp 98.1°F | Ht 69.0 in | Wt 224.3 lb

## 2021-04-28 DIAGNOSIS — I129 Hypertensive chronic kidney disease with stage 1 through stage 4 chronic kidney disease, or unspecified chronic kidney disease: Secondary | ICD-10-CM | POA: Diagnosis not present

## 2021-04-28 DIAGNOSIS — Z0000 Encounter for general adult medical examination without abnormal findings: Secondary | ICD-10-CM | POA: Diagnosis not present

## 2021-04-28 DIAGNOSIS — E1122 Type 2 diabetes mellitus with diabetic chronic kidney disease: Secondary | ICD-10-CM | POA: Diagnosis not present

## 2021-04-28 DIAGNOSIS — I10 Essential (primary) hypertension: Secondary | ICD-10-CM

## 2021-04-28 DIAGNOSIS — Z23 Encounter for immunization: Secondary | ICD-10-CM

## 2021-04-28 DIAGNOSIS — IMO0001 Reserved for inherently not codable concepts without codable children: Secondary | ICD-10-CM

## 2021-04-28 DIAGNOSIS — N289 Disorder of kidney and ureter, unspecified: Secondary | ICD-10-CM

## 2021-04-28 DIAGNOSIS — C3490 Malignant neoplasm of unspecified part of unspecified bronchus or lung: Secondary | ICD-10-CM | POA: Diagnosis not present

## 2021-04-28 LAB — POCT GLYCOSYLATED HEMOGLOBIN (HGB A1C): Hemoglobin A1C: 5.9 % — AB (ref 4.0–5.6)

## 2021-04-28 LAB — POCT UA - MICROALBUMIN
Albumin/Creatinine Ratio, Urine, POC: <30
Creatinine, POC: 200 mg/dL
Microalbumin Ur, POC: 30 mg/L

## 2021-04-28 NOTE — Progress Notes (Signed)
Subjective:   Kevin Lambert. is a 65 y.o. male who presents for an Initial Medicare Annual Wellness Visit.  The patient states that he is doing well in general.  He states he recently went back to work at [employer].  He is working 4-hour shifts.  States that his legs and feet feel very tired and achy throughout the shift.  He states that if he could take a 5-minute break, even if just to rest his feet, every hour while at work, this would help a great deal.  Asked patient to obtain worksheet for him to get accommodations while at work.  He states he will talk to human resources about this and get form to me as soon as possible. The patient is diabetic.  A check of his hemoglobin A1c is 5.9 today.  His urine microalbumin is normal. He continues to be treated for lung cancer at the [facility].  He goes for treatments every 3 weeks.  His lab work is monitored routinely.  His most recent CBC was taken on 04/15/2021.  He is mildly anemic and this is stable.  His thyroid panel was normal.  His kidney functions continue to be elevated.  They are stable.  GFR also stable. The patient denies new physical concerns or complaints.denies chest pain, chest pressure, or shortness of breath. He denies headaches or visual disturbances. He denies abdominal pain, nausea, vomiting, or changes in bowel or bladder habits.    Review of Systems    .Review of Systems  Constitutional:  Negative for chills, fever and weight loss.       Patient reports some fatigue, especially in his legs while working.  HENT:  Negative for congestion, hearing loss, sinus pain, sore throat and tinnitus.   Eyes: Negative.   Respiratory:  Negative for cough, shortness of breath and wheezing.        Patient is treated for lung cancer at the [facility].  He gets treatments every 3 weeks.  Cardiovascular:  Negative for chest pain, palpitations, orthopnea, claudication and leg swelling.  Gastrointestinal:  Negative for  abdominal pain, constipation, diarrhea, melena, nausea and vomiting.  Genitourinary:  Negative for flank pain, frequency and urgency.  Musculoskeletal:  Negative for back pain, myalgias and neck pain.  Skin: Negative.  Negative for itching and rash.  Neurological:  Negative for dizziness, tremors, weakness and headaches.  Endo/Heme/Allergies:  Negative for environmental allergies and polydipsia. Does not bruise/bleed easily.       Blood sugars doing well    Psychiatric/Behavioral:  Negative for depression and suicidal ideas. The patient is not nervous/anxious and does not have insomnia.          Objective:   Physical Exam Vitals and nursing note reviewed.  Constitutional:      Appearance: Normal appearance. He is well-developed. He is obese.  HENT:     Head: Normocephalic and atraumatic.     Right Ear: Ear canal and external ear normal.     Left Ear: Ear canal and external ear normal.     Nose: Nose normal.     Mouth/Throat:     Mouth: Mucous membranes are moist.     Pharynx: Oropharynx is clear.  Eyes:     Conjunctiva/sclera: Conjunctivae normal.     Pupils: Pupils are equal, round, and reactive to light.  Neck:     Vascular: No carotid bruit.  Cardiovascular:     Rate and Rhythm: Normal rate and regular rhythm.  Pulses:          Dorsalis pedis pulses are 1+ on the right side and 1+ on the left side.       Posterior tibial pulses are 1+ on the right side and 1+ on the left side.     Heart sounds: Normal heart sounds.  Pulmonary:     Effort: Pulmonary effort is normal.     Breath sounds: Normal breath sounds.  Abdominal:     General: Bowel sounds are normal.     Palpations: Abdomen is soft.     Tenderness: There is no abdominal tenderness.  Musculoskeletal:        General: Normal range of motion.     Cervical back: Normal range of motion and neck supple.     Right foot: Normal range of motion. No deformity or bunion.     Left foot: Normal range of motion. No deformity  or bunion.  Feet:     Right foot:     Protective Sensation: 10 sites tested.  10 sites sensed.     Skin integrity: Skin integrity normal.     Toenail Condition: Right toenails are normal.     Left foot:     Protective Sensation: 10 sites tested.  10 sites sensed.     Skin integrity: Skin integrity normal.     Toenail Condition: Left toenails are normal.  Lymphadenopathy:     Cervical: No cervical adenopathy.  Skin:    General: Skin is warm and dry.     Capillary Refill: Capillary refill takes less than 2 seconds.  Neurological:     General: No focal deficit present.     Mental Status: He is alert and oriented to person, place, and time.     Cranial Nerves: No cranial nerve deficit.     Sensory: No sensory deficit.     Motor: No weakness.     Coordination: Coordination normal.  Psychiatric:        Mood and Affect: Mood normal.        Behavior: Behavior normal.        Thought Content: Thought content normal.        Judgment: Judgment normal.    Today's Vitals   04/28/21 1018 04/28/21 1123  BP: (!) 147/79 138/76  Pulse: 78 66  Temp: 98.1 F (36.7 C)   SpO2: 95% 98%  Weight: 224 lb 4.8 oz (101.7 kg)   Height: 5\' 9"  (1.753 m)    Body mass index is 33.12 kg/m.  Advanced Directives 05/06/2021 04/15/2021 03/25/2021 03/04/2021 02/11/2021 01/21/2021 12/31/2020  Does Patient Have a Medical Advance Directive? No No No No No No No  Would patient like information on creating a medical advance directive? No - Patient declined No - Patient declined No - Patient declined No - Patient declined No - Patient declined No - Patient declined No - Patient declined    Current Medications (verified) Outpatient Encounter Medications as of 04/28/2021  Medication Sig   amLODipine (NORVASC) 10 MG tablet Take 10 mg by mouth daily.   Ascorbic Acid (VITAMIN C) 1000 MG tablet Take 1,000 mg by mouth daily.   aspirin EC 81 MG tablet Take 81 mg by mouth daily.   atorvastatin (LIPITOR) 10 MG tablet Take 10 mg by  mouth daily.   bisacodyl (DULCOLAX) 5 MG EC tablet Take 5 mg by mouth daily as needed for moderate constipation.   carvedilol (COREG) 3.125 MG tablet Take 1 tablet (3.125 mg total) by mouth  2 (two) times daily with a meal.   cholecalciferol (VITAMIN D3) 25 MCG (1000 UNIT) tablet Take 1,000 Units by mouth daily.   Cyanocobalamin (VITAMIN B 12 PO) Take 1 tablet by mouth daily.   dapagliflozin propanediol (FARXIGA) 10 MG TABS tablet Take 1 tablet (10 mg total) by mouth daily.   folic acid (FOLVITE) 1 MG tablet Take 1 tablet (1 mg total) by mouth daily. Pt needs to pick up today and start ASAP . He will pay out of pocket. He is waiting on the [place] to send his folic acid which will be next week.   furosemide (LASIX) 20 MG tablet Take 1 tablet (20 mg total) by mouth daily. As needed   lidocaine-prilocaine (EMLA) cream Apply to Port-A-Cath site 30-60-minute before treatment.   magic mouthwash SOLN Take 5 mLs by mouth 4 (four) times daily as needed for mouth pain. Swish and swallow or spit   meclizine (ANTIVERT) 12.5 MG tablet Take 1 tablet (12.5 mg total) by mouth 3 (three) times daily as needed for dizziness.   nicotine (NICODERM CQ) 21 mg/24hr patch Place 1 patch (21 mg total) onto the skin daily.   nicotine polacrilex (COMMIT) 4 MG lozenge Take 1 lozenge (4 mg total) by mouth as needed for smoking cessation.   potassium chloride SA (KLOR-CON) 20 MEQ tablet Take 1 tablet (20 mEq total) by mouth daily.   pregabalin (LYRICA) 25 MG capsule Take 1 capsule (25 mg total) by mouth daily.   Propylene Glycol (SYSTANE BALANCE OP) Place 1 drop into both eyes daily.   sildenafil (VIAGRA) 100 MG tablet TAKE ONE TABLET BY MOUTH AS INSTRUCTED (TAKE 1 HOUR PRIOR TO SEXUAL ACTIVITY *DO NOT EXCEED 1 DOSE PER 24 HOUR PERIOD*)   triamterene-hydrochlorothiazide (MAXZIDE-25) 37.5-25 MG tablet Take 1 tablet by mouth daily.   zinc gluconate 50 MG tablet Take 50 mg by mouth daily.   No facility-administered encounter  medications on file as of 04/28/2021.    Allergies (verified) Metformin and related, Naproxen, and Oxycontin [oxycodone]   History: Past Medical History:  Diagnosis Date   Arthritis    Chronic kidney disease    Depression    DM (diabetes mellitus) ([place])    type II   Dyslipidemia    Dyspnea    unable to walk much - he thinks it is from    GERD (gastroesophageal reflux disease)    Hepatitis    Hepatitis C- treated   History of kidney stones    HTN (hypertension)    Neuropathy    nscl ca dx'd 10/2019   OSA on CPAP    PTSD (post-traumatic stress disorder)    Past Surgical History:  Procedure Laterality Date   BIOPSY OF MEDIASTINAL MASS  12/10/2019   Procedure: BIOPSY OF MEDIASTINAL MASS;  Surgeon: Garner Nash, DO;  Location: [facility];  Service: Pulmonary;;  right upper lobe   BRONCHIAL NEEDLE ASPIRATION BIOPSY  12/10/2019   Procedure: BRONCHIAL NEEDLE ASPIRATION BIOPSIES;  Surgeon: Garner Nash, DO;  Location: [facility];  Service: Pulmonary;;   COLONOSCOPY     IR IMAGING GUIDED PORT INSERTION  12/31/2019   kidney stone     URETERAL STENT PLACEMENT     Ureteral Stent Removed     VIDEO BRONCHOSCOPY WITH ENDOBRONCHIAL ULTRASOUND N/A 12/10/2019   Procedure: VIDEO BRONCHOSCOPY WITH ENDOBRONCHIAL ULTRASOUND;  Surgeon: Garner Nash, DO;  Location: [facility];  Service: Pulmonary;  Laterality: N/A;   Family History  Problem Relation Age of Onset  Liver disease Mother    Diabetes Father    Social History   Socioeconomic History   Marital status: Single    Spouse name: Not on file   Number of children: Not on file   Years of education: Not on file   Highest education level: Not on file  Occupational History   Occupation: Psychologist, counselling    Employer: [id number]  Tobacco Use   Smoking status: Some Days    Packs/day: 0.60    Years: 50.00    Pack years: 30.00    Types: Cigarettes   Smokeless tobacco: Never   Tobacco comments:    03/25/21: 1-2 Black & Milds   Vaping Use   Vaping Use: Never used  Substance and Sexual Activity   Alcohol use: Yes    Alcohol/week: 6.0 standard drinks    Types: 6 Cans of beer per week   Drug use: Not Currently   Sexual activity: Yes    Birth control/protection: None  Other Topics Concern   Not on file  Social History Narrative   Not on file   Social Determinants of Health   Financial Resource Strain: High Risk   Difficulty of Paying Living Expenses: Very hard  Food Insecurity: Food Insecurity Present   Worried About [place] in the Last Year: Sometimes true   Ran Out of Food in the Last Year: Sometimes true  Transportation Needs: No Transportation Needs   Lack of Transportation (Medical): No   Lack of Transportation (Non-Medical): No  Physical Activity: Not on file  Stress: Stress Concern Present   Feeling of Stress : To some extent  Social Connections: Unknown   Frequency of Communication with Friends and Family: Three times a week   Frequency of Social Gatherings with Friends and Family: Three times a week   Attends Religious Services: Never   Active Member of Clubs or Organizations: No   Attends Archivist Meetings: Never   Marital Status: Not on file    Tobacco Counseling Patient states that he smokes one to two black and milds per week.     Diabetic?yes      Activities of Daily Living In your present state of health, do you have any difficulty performing the following activities: 04/28/2021 02/26/2021  Hearing? N N  Vision? Y Y  Difficulty concentrating or making decisions? N Y  Walking or climbing stairs? Y Y  Dressing or bathing? N N  Doing errands, shopping? N N  Some recent data might be hidden    Patient Care Team: [name], NP as PCP - General (Family Medicine)     Assessment:   1. Encounter for Medicare annual wellness exam Annual Medicare wellness visit today.  2. 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  Hemoglobin A1c is 5.9 today.  His microalbumin is normal.  We will continue to monitor every 3 months. - POCT HgB A1C - POCT UA - Microalbumin  3. Essential hypertension Stable.  Continue blood pressure medication as prescribed.  4. Malignant neoplasm of lung, unspecified laterality, unspecified part of lung ([place]) Patient to continue regular visits with [facility].  5. Need for shingles vaccine Initial shingles vaccine administered in the office today.  Will give second vaccine in the office at next routine visit. - Varicella-zoster vaccine IM  6. Abnormal renal function Reviewed recent labs with patient.  Renal functions are elevated but stable.  They are monitored carefully.  Hearing/Vision  screen No results found.   Depression Screen PHQ 2/9 Scores 04/28/2021 02/26/2021 01/08/2021  PHQ - 2 Score 0 0 0  PHQ- 9 Score 1 4 9     Fall Risk Fall Risk  04/28/2021 02/26/2021 01/08/2021  Falls in the past year? 0 0 0  Number falls in past yr: 0 0 -  Injury with Fall? 0 - -  Follow up Falls evaluation completed Falls evaluation completed Falls evaluation completed    [place]:  Any stairs in or around the home? Yes  If so, are there any without handrails? No  Home free of loose throw rugs in walkways, pet beds, electrical cords, etc? Yes  Adequate lighting in your home to reduce risk of falls? Yes   ASSISTIVE DEVICES UTILIZED TO PREVENT FALLS:  Life alert? No  Use of a cane, walker or w/c? No  Grab bars in the bathroom? Yes  Shower chair or bench in shower? Yes  Elevated toilet seat or a handicapped toilet? No   TIMED UP AND GO:  Was the test performed? Yes .  Length of time to ambulate 10 feet: 10  sec.   Gait steady and fast with assistive device  Cognitive Function:     6CIT Screen 04/28/2021  What Year? 0 points  What month? 0 points  What time? 0 points  Count back from 20 0 points  Months in reverse  0 points  Repeat phrase 0 points  Total Score 0    Immunizations Immunization History  Administered Date(s) Administered   Moderna Sars-Covid-2 Vaccination 12/31/2020   Pneumococcal-Unspecified 05/21/2020   Zoster Recombinat (Shingrix) 04/28/2021    TDAP status: Up to date  Flu Vaccine status: Due, Education has been provided regarding the importance of this vaccine. Advised may receive this vaccine at local pharmacy or Health Dept. Aware to provide a copy of the vaccination record if obtained from local pharmacy or Health Dept. Verbalized acceptance and understanding.  Pneumococcal vaccine status: Up to date  Covid-19 vaccine status: Information provided on how to obtain vaccines.   Qualifies for Shingles Vaccine? Yes   Zostavax completed No   Shingrix Completed?: No.    Education has been provided regarding the importance of this vaccine. Patient has been advised to call insurance company to determine out of pocket expense if they have not yet received this vaccine. Advised may also receive vaccine at local pharmacy or Health Dept. Verbalized acceptance and understanding.  Screening Tests Health Maintenance  Topic Date Due   OPHTHALMOLOGY EXAM  Never done   HIV Screening  Never done   Hepatitis C Screening  Never done   COLONOSCOPY (Pts 45-71yrs Insurance coverage will need to be confirmed)  Never done   COVID-19 Vaccine (3 - Moderna risk series) 01/28/2021   PNA vac Low Risk Adult (1 of 2 - PCV13) 05/21/2021   INFLUENZA VACCINE  06/07/2021   HEMOGLOBIN A1C  10/28/2021   FOOT EXAM  04/28/2022   URINE MICROALBUMIN  04/28/2022   TETANUS/TDAP  07/14/2027   Zoster Vaccines- Shingrix  Completed   HPV VACCINES  Aged Out    Health Maintenance  Health Maintenance Due  Topic Date Due   OPHTHALMOLOGY EXAM  Never done   HIV Screening  Never done   Hepatitis C Screening  Never done   COLONOSCOPY (Pts 45-84yrs Insurance coverage will need to be confirmed)  Never done    COVID-19 Vaccine (3 - Moderna risk series) 01/28/2021  Lung Cancer Screening: (Low Dose CT Chest recommended if Age 37-80 years, 30 pack-year currently smoking OR have quit w/in 15years.) does qualify. Patient with current diagnosis of lung cancer.  Is actively treated through cancer center.  Additional Screening:  Hepatitis C Screening: does qualify; Completed no   Vision Screening: Recommended annual ophthalmology exams for early detection of glaucoma and other disorders of the eye. Is the patient up to date with their annual eye exam?  No  Who is the provider or what is the name of the office in which the patient attends [facility] If pt is not established with a provider, would they like to be referred to a provider to establish care? Yes .   Dental Screening: Recommended annual dental exams for proper oral hygiene  Community Resource Referral / Chronic Care Management: CRR required this visit?  No   CCM required this visit?  No      Plan:     I have personally reviewed and noted the following in the patient's chart:   Medical and social history Use of alcohol, tobacco or illicit drugs  Current medications and supplements including opioid prescriptions. Patient is not currently taking opioid prescriptions. Functional ability and status Nutritional status Physical activity Advanced directives List of other physicians Hospitalizations, surgeries, and ER visits in previous 12 months Vitals Screenings to include cognitive, depression, and falls Referrals and appointments  In addition, I have reviewed and discussed with patient certain preventive protocols, quality metrics, and best practice recommendations. A written personalized care plan for preventive services as well as general preventive health recommendations were provided to patient.

## 2021-05-03 ENCOUNTER — Telehealth: Payer: Self-pay | Admitting: Nurse Practitioner

## 2021-05-03 NOTE — Telephone Encounter (Signed)
Thank you. I have put it in his chart under HM.

## 2021-05-03 NOTE — Telephone Encounter (Signed)
Patient called stating if he is fully vaccinated but not boosted. The second vaccine was February 4 th 2022. He was in office last week.

## 2021-05-05 ENCOUNTER — Telehealth: Payer: Self-pay | Admitting: Medical Oncology

## 2021-05-05 NOTE — Telephone Encounter (Signed)
Pt stated he understands that going back to work is at his own risk.

## 2021-05-06 ENCOUNTER — Inpatient Hospital Stay (HOSPITAL_BASED_OUTPATIENT_CLINIC_OR_DEPARTMENT_OTHER): Payer: No Typology Code available for payment source | Admitting: Internal Medicine

## 2021-05-06 ENCOUNTER — Other Ambulatory Visit: Payer: Self-pay

## 2021-05-06 ENCOUNTER — Inpatient Hospital Stay: Payer: No Typology Code available for payment source

## 2021-05-06 ENCOUNTER — Encounter: Payer: Self-pay | Admitting: Internal Medicine

## 2021-05-06 VITALS — BP 134/74 | HR 77 | Temp 97.5°F | Resp 20 | Ht 69.0 in | Wt 225.9 lb

## 2021-05-06 DIAGNOSIS — C3491 Malignant neoplasm of unspecified part of right bronchus or lung: Secondary | ICD-10-CM

## 2021-05-06 DIAGNOSIS — Z5112 Encounter for antineoplastic immunotherapy: Secondary | ICD-10-CM

## 2021-05-06 DIAGNOSIS — C3490 Malignant neoplasm of unspecified part of unspecified bronchus or lung: Secondary | ICD-10-CM | POA: Diagnosis not present

## 2021-05-06 LAB — CMP (CANCER CENTER ONLY)
ALT: 17 U/L (ref 0–44)
AST: 15 U/L (ref 15–41)
Albumin: 3.4 g/dL — ABNORMAL LOW (ref 3.5–5.0)
Alkaline Phosphatase: 63 U/L (ref 38–126)
Anion gap: 8 (ref 5–15)
BUN: 37 mg/dL — ABNORMAL HIGH (ref 8–23)
CO2: 23 mmol/L (ref 22–32)
Calcium: 9.6 mg/dL (ref 8.9–10.3)
Chloride: 106 mmol/L (ref 98–111)
Creatinine: 2.18 mg/dL — ABNORMAL HIGH (ref 0.61–1.24)
GFR, Estimated: 33 mL/min — ABNORMAL LOW (ref >60)
Glucose, Bld: 144 mg/dL — ABNORMAL HIGH (ref 70–99)
Potassium: 4.7 mmol/L (ref 3.5–5.1)
Sodium: 137 mmol/L (ref 135–145)
Total Bilirubin: 0.3 mg/dL (ref 0.3–1.2)
Total Protein: 6.7 g/dL (ref 6.5–8.1)

## 2021-05-06 LAB — CBC WITH DIFFERENTIAL (CANCER CENTER ONLY)
Abs Immature Granulocytes: 0.03 10*3/uL (ref 0.00–0.07)
Basophils Absolute: 0.1 10*3/uL (ref 0.0–0.1)
Basophils Relative: 1 %
Eosinophils Absolute: 0.3 10*3/uL (ref 0.0–0.5)
Eosinophils Relative: 6 %
HCT: 37.4 % — ABNORMAL LOW (ref 39.0–52.0)
Hemoglobin: 13 g/dL (ref 13.0–17.0)
Immature Granulocytes: 1 %
Lymphocytes Relative: 42 %
Lymphs Abs: 2.2 10*3/uL (ref 0.7–4.0)
MCH: 32.7 pg (ref 26.0–34.0)
MCHC: 34.8 g/dL (ref 30.0–36.0)
MCV: 94.2 fL (ref 80.0–100.0)
Monocytes Absolute: 0.4 10*3/uL (ref 0.1–1.0)
Monocytes Relative: 8 %
Neutro Abs: 2.2 10*3/uL (ref 1.7–7.7)
Neutrophils Relative %: 42 %
Platelet Count: 177 10*3/uL (ref 150–400)
RBC: 3.97 MIL/uL — ABNORMAL LOW (ref 4.22–5.81)
RDW: 13.8 % (ref 11.5–15.5)
WBC Count: 5.2 10*3/uL (ref 4.0–10.5)
nRBC: 0 % (ref 0.0–0.2)

## 2021-05-06 MED ORDER — SODIUM CHLORIDE 0.9% FLUSH
10.0000 mL | INTRAVENOUS | Status: DC | PRN
  Administered 2021-05-06: 10 mL
  Filled 2021-05-06: qty 10

## 2021-05-06 MED ORDER — SODIUM CHLORIDE 0.9 % IV SOLN
Freq: Once | INTRAVENOUS | Status: AC
  Filled 2021-05-06: qty 250

## 2021-05-06 MED ORDER — HEPARIN SOD (PORK) LOCK FLUSH 100 UNIT/ML IV SOLN
500.0000 [IU] | Freq: Once | INTRAVENOUS | Status: AC | PRN
  Administered 2021-05-06: 500 [IU]
  Filled 2021-05-06: qty 5

## 2021-05-06 MED ORDER — SODIUM CHLORIDE 0.9 % IV SOLN
200.0000 mg | Freq: Once | INTRAVENOUS | Status: AC
  Administered 2021-05-06: 200 mg via INTRAVENOUS
  Filled 2021-05-06: qty 8

## 2021-05-06 NOTE — Progress Notes (Signed)
Per Dr. Julien Nordmann, okay to treat with elevated creatinine.

## 2021-05-06 NOTE — Patient Instructions (Signed)
[facility]  Discharge Instructions: ?Thank you for choosing [facility] to provide your oncology and hematology care.  ? ?If you have a lab appointment with the Cancer Center, please go directly to the Cancer Center and check in at the registration area. ?  ?Wear comfortable clothing and clothing appropriate for easy access to any Portacath or PICC line.  ? ?We strive to give you quality time with your provider. You may need to reschedule your appointment if you arrive late (15 or more minutes).  Arriving late affects you and other patients whose appointments are after yours.  Also, if you miss three or more appointments without notifying the office, you may be dismissed from the clinic at the provider?s discretion.    ?  ?For prescription refill requests, have your pharmacy contact our office and allow 72 hours for refills to be completed.   ? ?Today you received the following chemotherapy and/or immunotherapy agents: Keytruda ?  ?To help prevent nausea and vomiting after your treatment, we encourage you to take your nausea medication as directed. ? ?BELOW ARE SYMPTOMS THAT SHOULD BE REPORTED IMMEDIATELY: ?*FEVER GREATER THAN 100.4 F (38 ?C) OR HIGHER ?*CHILLS OR SWEATING ?*NAUSEA AND VOMITING THAT IS NOT CONTROLLED WITH YOUR NAUSEA MEDICATION ?*UNUSUAL SHORTNESS OF BREATH ?*UNUSUAL BRUISING OR BLEEDING ?*URINARY PROBLEMS (pain or burning when urinating, or frequent urination) ?*BOWEL PROBLEMS (unusual diarrhea, constipation, pain near the anus) ?TENDERNESS IN MOUTH AND THROAT WITH OR WITHOUT PRESENCE OF ULCERS (sore throat, sores in mouth, or a toothache) ?UNUSUAL RASH, SWELLING OR PAIN  ?UNUSUAL VAGINAL DISCHARGE OR ITCHING  ? ?Items with * indicate a potential emergency and should be followed up as soon as possible or go to the Emergency Department if any problems should occur. ? ?Please show the CHEMOTHERAPY ALERT CARD or IMMUNOTHERAPY ALERT CARD at check-in to the  Emergency Department and triage nurse. ? ?Should you have questions after your visit or need to cancel or reschedule your appointment, please contact [facility]  Dept: [phone]  and follow the prompts.  Office hours are 8:00 a.m. to 4:30 p.m. Monday - Friday. Please note that voicemails left after 4:00 p.m. may not be returned until the following business day.  We are closed weekends and major holidays. You have access to a nurse at all times for urgent questions. Please call the main number to the clinic Dept: [phone] and follow the prompts. ? ? ?For any non-urgent questions, you may also contact your provider using MyChart. We now offer e-Visits for anyone 18 and older to request care online for non-urgent symptoms. For details visit [email or web address]. ?  ?Also download the MyChart app! Go to the app store, search "MyChart", open the app, select [facility], and log in with your MyChart username and password. ? ?Due to Covid, a mask is required upon entering the hospital/clinic. If you do not have a mask, one will be given to you upon arrival. For doctor visits, patients may have 1 support person aged 18 or older with them. For treatment visits, patients cannot have anyone with them due to current Covid guidelines and our immunocompromised population.  ? ?

## 2021-05-06 NOTE — Progress Notes (Signed)
[facility] [phone]   [phone]  OFFICE PROGRESS NOTE  [name], NP [place] [address]  DIAGNOSIS: Stage IV (T2b, N3, M1a) non-small cell lung cancer, adenocarcinoma presented with large right upper lobe lung mass in addition to mediastinal and right supraclavicular lymphadenopathy as well as bilateral pulmonary nodules diagnosed in February 2021.  MOLECULAR STUDY by [employer]:  KRASG12C, 4.3%, Binimetinib  [id number], 0.9%, Niraparib, Olaparib, Rucaparib,Talazoparib, Tazemetostat  [id number], 1.7%, None   PRIOR THERAPY: None  CURRENT THERAPY: Systemic chemotherapy with carboplatin for AUC of 5, Alimta 500 mg/M2 and Keytruda 200 mg IV every 3 weeks.  First dose December 31, 2019.  Status post 22 cycles.  The last few cycles he has been treated with single agent Keytruda secondary to renal insufficiency.  INTERVAL HISTORY: Kevin Lambert. 65 y.o. male returns to the clinic today for follow-up visit.  The patient is feeling fine today with no concerning complaints except for mild fatigue.  He denied having any recent chest pain, shortness of breath, cough or hemoptysis.  He has no current nausea, vomiting, diarrhea or constipation.  He has no weight loss or night sweats.  He has no headache or visual changes.  He continues to tolerate his maintenance treatment with Keytruda fairly well.  He is here today for evaluation before starting cycle #23.  MEDICAL HISTORY: Past Medical History:  Diagnosis Date   Arthritis    Chronic kidney disease    Depression    DM (diabetes mellitus) ([place])    type II   Dyslipidemia    Dyspnea    unable to walk much - he thinks it is from    GERD (gastroesophageal reflux disease)    Hepatitis    Hepatitis C- treated   History of kidney stones    HTN (hypertension)    Neuropathy    nscl ca dx'd 10/2019   OSA on CPAP    PTSD (post-traumatic stress disorder)      ALLERGIES:  is allergic to metformin and related, naproxen, and oxycontin [oxycodone].  MEDICATIONS:  Current Outpatient Medications  Medication Sig Dispense Refill   amLODipine (NORVASC) 10 MG tablet Take 10 mg by mouth daily.     Ascorbic Acid (VITAMIN C) 1000 MG tablet Take 1,000 mg by mouth daily.     aspirin EC 81 MG tablet Take 81 mg by mouth daily.     atorvastatin (LIPITOR) 10 MG tablet Take 10 mg by mouth daily.     bisacodyl (DULCOLAX) 5 MG EC tablet Take 5 mg by mouth daily as needed for moderate constipation.     carvedilol (COREG) 3.125 MG tablet Take 1 tablet (3.125 mg total) by mouth 2 (two) times daily with a meal. 180 tablet 0   cholecalciferol (VITAMIN D3) 25 MCG (1000 UNIT) tablet Take 1,000 Units by mouth daily.     Cyanocobalamin (VITAMIN B 12 PO) Take 1 tablet by mouth daily.     dapagliflozin propanediol (FARXIGA) 10 MG TABS tablet Take 1 tablet (10 mg total) by mouth daily. 90 tablet 1   folic acid (FOLVITE) 1 MG tablet Take 1 tablet (1 mg total) by mouth daily. Pt needs to pick up today and start ASAP . He will pay out of pocket. He is waiting on the [place] to send his folic acid which will be next week. 7 tablet 0   furosemide (LASIX) 20 MG tablet Take 1 tablet (20  mg total) by mouth daily. As needed 20 tablet 0   lidocaine-prilocaine (EMLA) cream Apply to Port-A-Cath site 30-60-minute before treatment. 30 g 0   magic mouthwash SOLN Take 5 mLs by mouth 4 (four) times daily as needed for mouth pain. Swish and swallow or spit 240 mL 2   meclizine (ANTIVERT) 12.5 MG tablet Take 1 tablet (12.5 mg total) by mouth 3 (three) times daily as needed for dizziness. 90 tablet 0   nicotine (NICODERM CQ) 21 mg/24hr patch Place 1 patch (21 mg total) onto the skin daily. 28 patch 3   nicotine polacrilex (COMMIT) 4 MG lozenge Take 1 lozenge (4 mg total) by mouth as needed for smoking cessation. 108 tablet 3   potassium chloride SA (KLOR-CON) 20 MEQ tablet Take 1 tablet (20 mEq total)  by mouth daily. 10 tablet 0   pregabalin (LYRICA) 25 MG capsule Take 1 capsule (25 mg total) by mouth daily. 30 capsule 1   Propylene Glycol (SYSTANE BALANCE OP) Place 1 drop into both eyes daily.     sildenafil (VIAGRA) 100 MG tablet TAKE ONE TABLET BY MOUTH AS INSTRUCTED (TAKE 1 HOUR PRIOR TO SEXUAL ACTIVITY *DO NOT EXCEED 1 DOSE PER 24 HOUR PERIOD*)     triamterene-hydrochlorothiazide (MAXZIDE-25) 37.5-25 MG tablet Take 1 tablet by mouth daily.     zinc gluconate 50 MG tablet Take 50 mg by mouth daily.     No current facility-administered medications for this visit.    SURGICAL HISTORY:  Past Surgical History:  Procedure Laterality Date   BIOPSY OF MEDIASTINAL MASS  12/10/2019   Procedure: BIOPSY OF MEDIASTINAL MASS;  Surgeon: Garner Nash, DO;  Location: [facility];  Service: Pulmonary;;  right upper lobe   BRONCHIAL NEEDLE ASPIRATION BIOPSY  12/10/2019   Procedure: BRONCHIAL NEEDLE ASPIRATION BIOPSIES;  Surgeon: Garner Nash, DO;  Location: [facility];  Service: Pulmonary;;   COLONOSCOPY     IR IMAGING GUIDED PORT INSERTION  12/31/2019   kidney stone     URETERAL STENT PLACEMENT     Ureteral Stent Removed     VIDEO BRONCHOSCOPY WITH ENDOBRONCHIAL ULTRASOUND N/A 12/10/2019   Procedure: VIDEO BRONCHOSCOPY WITH ENDOBRONCHIAL ULTRASOUND;  Surgeon: Garner Nash, DO;  Location: [facility];  Service: Pulmonary;  Laterality: N/A;    REVIEW OF SYSTEMS:  A comprehensive review of systems was negative except for: Constitutional: positive for fatigue   PHYSICAL EXAMINATION: General appearance: alert, cooperative, fatigued, and no distress Head: Normocephalic, without obvious abnormality, atraumatic Neck: no adenopathy, no JVD, supple, symmetrical, trachea midline, and thyroid not enlarged, symmetric, no tenderness/mass/nodules Lymph nodes: Cervical, supraclavicular, and axillary nodes normal. Resp: clear to auscultation bilaterally Back: symmetric, no curvature. ROM normal. No  CVA tenderness. Cardio: regular rate and rhythm, S1, S2 normal, no murmur, click, rub or gallop GI: soft, non-tender; bowel sounds normal; no masses,  no organomegaly Extremities: extremities normal, atraumatic, no cyanosis or edema  ECOG PERFORMANCE STATUS: 1 - Symptomatic but completely ambulatory  Blood pressure 134/74, pulse 77, temperature (!) 97.5 F (36.4 C), temperature source Tympanic, resp. rate 20, height 5\' 9"  (1.753 m), weight 225 lb 14.4 oz (102.5 kg), SpO2 100 %.  LABORATORY DATA: Lab Results  Component Value Date   WBC 5.7 04/15/2021   HGB 12.4 (L) 04/15/2021   HCT 35.0 (L) 04/15/2021   MCV 92.6 04/15/2021   PLT 205 04/15/2021      Chemistry      Component Value Date/Time   NA 137 04/15/2021 0845  NA 138 01/08/2021 0955   K 4.5 04/15/2021 0845   CL 103 04/15/2021 0845   CO2 24 04/15/2021 0845   BUN 28 (H) 04/15/2021 0845   BUN 28 (H) 01/08/2021 0955   CREATININE 1.87 (H) 04/15/2021 0845      Component Value Date/Time   CALCIUM 9.8 04/15/2021 0845   ALKPHOS 60 04/15/2021 0845   AST 15 04/15/2021 0845   ALT 20 04/15/2021 0845   BILITOT 0.3 04/15/2021 0845       RADIOGRAPHIC STUDIES: No results found.   ASSESSMENT AND PLAN: This is a very pleasant 65 years old African-American male recently diagnosed with a stage IV non-small cell lung cancer, adenocarcinoma with no actionable mutations presented with large right lower lobe lung mass in addition to mediastinal and right supraclavicular lymphadenopathy as well as bilateral pulmonary nodules diagnosed in February 2021. I explained to the patient that he has incurable condition and all the treatment options will be of palliative nature. The patient has no actionable mutations on the recent molecular studies and he is not a candidate for the [facility] clinical trial. He started systemic chemotherapy with carboplatin, Alimta and Keytruda status post 22 cycles.  Starting from cycle #5 the patient is on  maintenance treatment with Alimta and Keytruda every 3 weeks. The patient has been tolerating this treatment well with no concerning adverse effects. I recommended for him to proceed with cycle #23 of his treatment with single agent Keytruda for now. I will see him back for follow-up visit in 3 weeks for evaluation with repeat CT scan of the chest, abdomen pelvis for restaging of his disease. For the hypertension, he will continue to monitor his blood pressure closely and take his medication as prescribed. For the renal insufficiency he is followed by his primary care physician. The patient was advised to call immediately if he has any concerning symptoms in the interval. The patient voices understanding of current disease status and treatment options and is in agreement with the current care plan.  All questions were answered. The patient knows to call the clinic with any problems, questions or concerns. We can certainly see the patient much sooner if necessary.  Disclaimer: This note was dictated with voice recognition software. Similar sounding words can inadvertently be transcribed and may not be corrected upon review.

## 2021-05-09 DIAGNOSIS — Z0000 Encounter for general adult medical examination without abnormal findings: Secondary | ICD-10-CM | POA: Insufficient documentation

## 2021-05-09 DIAGNOSIS — Z23 Encounter for immunization: Secondary | ICD-10-CM | POA: Insufficient documentation

## 2021-05-25 ENCOUNTER — Ambulatory Visit (HOSPITAL_COMMUNITY)
Admission: RE | Admit: 2021-05-25 | Discharge: 2021-05-25 | Disposition: A | Discharge status: Home / Self Care | Payer: No Typology Code available for payment source | Source: Ambulatory Visit | Attending: Internal Medicine | Admitting: Internal Medicine

## 2021-05-25 ENCOUNTER — Other Ambulatory Visit: Payer: Self-pay

## 2021-05-25 ENCOUNTER — Encounter: Payer: Self-pay | Admitting: Internal Medicine

## 2021-05-25 DIAGNOSIS — C3490 Malignant neoplasm of unspecified part of unspecified bronchus or lung: Secondary | ICD-10-CM | POA: Diagnosis present

## 2021-05-27 ENCOUNTER — Inpatient Hospital Stay: Payer: No Typology Code available for payment source

## 2021-05-27 ENCOUNTER — Inpatient Hospital Stay: Payer: No Typology Code available for payment source | Attending: Internal Medicine | Admitting: Physician Assistant

## 2021-05-27 ENCOUNTER — Telehealth: Payer: Self-pay

## 2021-05-27 DIAGNOSIS — I129 Hypertensive chronic kidney disease with stage 1 through stage 4 chronic kidney disease, or unspecified chronic kidney disease: Secondary | ICD-10-CM | POA: Insufficient documentation

## 2021-05-27 DIAGNOSIS — Z5112 Encounter for antineoplastic immunotherapy: Secondary | ICD-10-CM | POA: Insufficient documentation

## 2021-05-27 DIAGNOSIS — Z79899 Other long term (current) drug therapy: Secondary | ICD-10-CM | POA: Insufficient documentation

## 2021-05-27 DIAGNOSIS — E1122 Type 2 diabetes mellitus with diabetic chronic kidney disease: Secondary | ICD-10-CM | POA: Insufficient documentation

## 2021-05-27 DIAGNOSIS — N189 Chronic kidney disease, unspecified: Secondary | ICD-10-CM | POA: Insufficient documentation

## 2021-05-27 NOTE — Telephone Encounter (Signed)
Pt called stating he has a runny nose and sore throat and is concerned he has COVID. Pt states he will get tested today but would like to convert his appt to a phone appt and r/s his tx.

## 2021-05-27 NOTE — Telephone Encounter (Signed)
Per Dr. Julien Nordmann and [facility], have pt get COVID tested and if he is positive, we can r/s him for 21 days out. If the pt is negative, then we can r/s his lab/OV/Tx.  I have called the pt back and advised of this. Pt expressed understanding of this information and will call us with his test results.

## 2021-05-31 ENCOUNTER — Telehealth: Payer: Self-pay | Admitting: Medical Oncology

## 2021-05-31 ENCOUNTER — Telehealth: Payer: Self-pay | Admitting: Internal Medicine

## 2021-05-31 NOTE — Telephone Encounter (Signed)
Pt stated his home COVID test was negative. Schedule message sent to r/s for this week.

## 2021-05-31 NOTE — Telephone Encounter (Signed)
Scheduled appointment per 07/25 sch msg. Patient is aware.

## 2021-06-03 ENCOUNTER — Inpatient Hospital Stay (HOSPITAL_BASED_OUTPATIENT_CLINIC_OR_DEPARTMENT_OTHER): Payer: No Typology Code available for payment source | Admitting: Internal Medicine

## 2021-06-03 ENCOUNTER — Other Ambulatory Visit: Payer: Self-pay

## 2021-06-03 ENCOUNTER — Inpatient Hospital Stay: Payer: No Typology Code available for payment source

## 2021-06-03 VITALS — BP 154/84 | HR 75 | Temp 98.9°F | Resp 17 | Wt 226.7 lb

## 2021-06-03 DIAGNOSIS — I129 Hypertensive chronic kidney disease with stage 1 through stage 4 chronic kidney disease, or unspecified chronic kidney disease: Secondary | ICD-10-CM | POA: Diagnosis not present

## 2021-06-03 DIAGNOSIS — C3411 Malignant neoplasm of upper lobe, right bronchus or lung: Secondary | ICD-10-CM | POA: Diagnosis present

## 2021-06-03 DIAGNOSIS — N189 Chronic kidney disease, unspecified: Secondary | ICD-10-CM | POA: Diagnosis not present

## 2021-06-03 DIAGNOSIS — C3491 Malignant neoplasm of unspecified part of right bronchus or lung: Secondary | ICD-10-CM

## 2021-06-03 DIAGNOSIS — Z79899 Other long term (current) drug therapy: Secondary | ICD-10-CM | POA: Diagnosis not present

## 2021-06-03 DIAGNOSIS — Z5112 Encounter for antineoplastic immunotherapy: Secondary | ICD-10-CM

## 2021-06-03 DIAGNOSIS — E1122 Type 2 diabetes mellitus with diabetic chronic kidney disease: Secondary | ICD-10-CM | POA: Diagnosis not present

## 2021-06-03 LAB — CBC WITH DIFFERENTIAL/PLATELET
Abs Immature Granulocytes: 0.02 10*3/uL (ref 0.00–0.07)
Basophils Absolute: 0 10*3/uL (ref 0.0–0.1)
Basophils Relative: 1 %
Eosinophils Absolute: 0.3 10*3/uL (ref 0.0–0.5)
Eosinophils Relative: 4 %
HCT: 36.4 % — ABNORMAL LOW (ref 39.0–52.0)
Hemoglobin: 12.6 g/dL — ABNORMAL LOW (ref 13.0–17.0)
Immature Granulocytes: 0 %
Lymphocytes Relative: 31 %
Lymphs Abs: 2 10*3/uL (ref 0.7–4.0)
MCH: 32 pg (ref 26.0–34.0)
MCHC: 34.6 g/dL (ref 30.0–36.0)
MCV: 92.4 fL (ref 80.0–100.0)
Monocytes Absolute: 0.5 10*3/uL (ref 0.1–1.0)
Monocytes Relative: 8 %
Neutro Abs: 3.6 10*3/uL (ref 1.7–7.7)
Neutrophils Relative %: 56 %
Platelets: 218 10*3/uL (ref 150–400)
RBC: 3.94 MIL/uL — ABNORMAL LOW (ref 4.22–5.81)
RDW: 14 % (ref 11.5–15.5)
WBC: 6.4 10*3/uL (ref 4.0–10.5)
nRBC: 0 % (ref 0.0–0.2)

## 2021-06-03 LAB — COMPREHENSIVE METABOLIC PANEL
ALT: 20 U/L (ref 0–44)
AST: 18 U/L (ref 15–41)
Albumin: 3.5 g/dL (ref 3.5–5.0)
Alkaline Phosphatase: 73 U/L (ref 38–126)
Anion gap: 8 (ref 5–15)
BUN: 28 mg/dL — ABNORMAL HIGH (ref 8–23)
CO2: 25 mmol/L (ref 22–32)
Calcium: 9.5 mg/dL (ref 8.9–10.3)
Chloride: 103 mmol/L (ref 98–111)
Creatinine, Ser: 2.03 mg/dL — ABNORMAL HIGH (ref 0.61–1.24)
GFR, Estimated: 36 mL/min — ABNORMAL LOW (ref >60)
Glucose, Bld: 130 mg/dL — ABNORMAL HIGH (ref 70–99)
Potassium: 4.8 mmol/L (ref 3.5–5.1)
Sodium: 136 mmol/L (ref 135–145)
Total Bilirubin: 0.3 mg/dL (ref 0.3–1.2)
Total Protein: 6.4 g/dL — ABNORMAL LOW (ref 6.5–8.1)

## 2021-06-03 LAB — TSH: TSH: 0.661 u[IU]/mL (ref 0.320–4.118)

## 2021-06-03 MED ORDER — HEPARIN SOD (PORK) LOCK FLUSH 100 UNIT/ML IV SOLN
500.0000 [IU] | Freq: Once | INTRAVENOUS | Status: AC | PRN
  Administered 2021-06-03: 500 [IU]
  Filled 2021-06-03: qty 5

## 2021-06-03 MED ORDER — SODIUM CHLORIDE 0.9% FLUSH
10.0000 mL | INTRAVENOUS | Status: DC | PRN
  Administered 2021-06-03: 10 mL
  Filled 2021-06-03: qty 10

## 2021-06-03 MED ORDER — SODIUM CHLORIDE 0.9 % IV SOLN
Freq: Once | INTRAVENOUS | Status: AC
  Filled 2021-06-03: qty 250

## 2021-06-03 MED ORDER — SODIUM CHLORIDE 0.9 % IV SOLN
200.0000 mg | Freq: Once | INTRAVENOUS | Status: AC
  Administered 2021-06-03: 200 mg via INTRAVENOUS
  Filled 2021-06-03: qty 8

## 2021-06-03 NOTE — Patient Instructions (Signed)
[facility]  Discharge Instructions: ?Thank you for choosing [facility] to provide your oncology and hematology care.  ? ?If you have a lab appointment with the Cancer Center, please go directly to the Cancer Center and check in at the registration area. ?  ?Wear comfortable clothing and clothing appropriate for easy access to any Portacath or PICC line.  ? ?We strive to give you quality time with your provider. You may need to reschedule your appointment if you arrive late (15 or more minutes).  Arriving late affects you and other patients whose appointments are after yours.  Also, if you miss three or more appointments without notifying the office, you may be dismissed from the clinic at the provider?s discretion.    ?  ?For prescription refill requests, have your pharmacy contact our office and allow 72 hours for refills to be completed.   ? ?Today you received the following chemotherapy and/or immunotherapy agents: Keytruda ?  ?To help prevent nausea and vomiting after your treatment, we encourage you to take your nausea medication as directed. ? ?BELOW ARE SYMPTOMS THAT SHOULD BE REPORTED IMMEDIATELY: ?*FEVER GREATER THAN 100.4 F (38 ?C) OR HIGHER ?*CHILLS OR SWEATING ?*NAUSEA AND VOMITING THAT IS NOT CONTROLLED WITH YOUR NAUSEA MEDICATION ?*UNUSUAL SHORTNESS OF BREATH ?*UNUSUAL BRUISING OR BLEEDING ?*URINARY PROBLEMS (pain or burning when urinating, or frequent urination) ?*BOWEL PROBLEMS (unusual diarrhea, constipation, pain near the anus) ?TENDERNESS IN MOUTH AND THROAT WITH OR WITHOUT PRESENCE OF ULCERS (sore throat, sores in mouth, or a toothache) ?UNUSUAL RASH, SWELLING OR PAIN  ?UNUSUAL VAGINAL DISCHARGE OR ITCHING  ? ?Items with * indicate a potential emergency and should be followed up as soon as possible or go to the Emergency Department if any problems should occur. ? ?Please show the CHEMOTHERAPY ALERT CARD or IMMUNOTHERAPY ALERT CARD at check-in to the  Emergency Department and triage nurse. ? ?Should you have questions after your visit or need to cancel or reschedule your appointment, please contact [facility]  Dept: [phone]  and follow the prompts.  Office hours are 8:00 a.m. to 4:30 p.m. Monday - Friday. Please note that voicemails left after 4:00 p.m. may not be returned until the following business day.  We are closed weekends and major holidays. You have access to a nurse at all times for urgent questions. Please call the main number to the clinic Dept: [phone] and follow the prompts. ? ? ?For any non-urgent questions, you may also contact your provider using MyChart. We now offer e-Visits for anyone 18 and older to request care online for non-urgent symptoms. For details visit [email or web address]. ?  ?Also download the MyChart app! Go to the app store, search "MyChart", open the app, select [facility], and log in with your MyChart username and password. ? ?Due to Covid, a mask is required upon entering the hospital/clinic. If you do not have a mask, one will be given to you upon arrival. For doctor visits, patients may have 1 support person aged 18 or older with them. For treatment visits, patients cannot have anyone with them due to current Covid guidelines and our immunocompromised population.  ? ?

## 2021-06-03 NOTE — Progress Notes (Signed)
Per Dr. Julien Nordmann , it is okay to treat Kevin Lambert today with Keytruda and serum creatinine of 2.03.

## 2021-06-03 NOTE — Progress Notes (Signed)
[place] [phone]   [phone]  OFFICE PROGRESS NOTE  [name], NP [place] [address]  DIAGNOSIS: Stage IV (T2b, N3, M1a) non-small cell lung cancer, adenocarcinoma presented with large right upper lobe lung mass in addition to mediastinal and right supraclavicular lymphadenopathy as well as bilateral pulmonary nodules diagnosed in February 2021.  MOLECULAR STUDY by [employer]:  KRASG12C, 4.3%, Binimetinib  [id number], 0.9%, Niraparib, Olaparib, Rucaparib,Talazoparib, Tazemetostat  [id number], 1.7%, None   PRIOR THERAPY: None  CURRENT THERAPY: Systemic chemotherapy with carboplatin for AUC of 5, Alimta 500 mg/M2 and Keytruda 200 mg IV every 3 weeks.  First dose December 31, 2019.  Status post 23 cycles.  The last few cycles he has been treated with single agent Keytruda secondary to renal insufficiency.  INTERVAL HISTORY: Kevin Lambert. 65 y.o. male returns to the clinic today for follow-up visit.  His treatment was delayed by 2 weeks after the patient has cold symptoms and was not feeling well.  He denied having any current chest pain, shortness of breath, cough or hemoptysis.  He denied having any nausea, vomiting, diarrhea or constipation.  He has no headache or visual changes.  He denied having any weight loss or night sweats.  He has been tolerating his treatment with single agent Keytruda fairly well.  He had repeat CT scan of the chest, abdomen pelvis performed recently and he is here for evaluation and discussion of his scan results before starting cycle #24.  MEDICAL HISTORY: Past Medical History:  Diagnosis Date   Arthritis    Chronic kidney disease    Depression    DM (diabetes mellitus) ([place])    type II   Dyslipidemia    Dyspnea    unable to walk much - he thinks it is from    GERD (gastroesophageal reflux disease)    Hepatitis    Hepatitis C- treated   History of kidney stones     HTN (hypertension)    Neuropathy    nscl ca dx'd 10/2019   OSA on CPAP    PTSD (post-traumatic stress disorder)     ALLERGIES:  is allergic to metformin and related, naproxen, and oxycontin [oxycodone].  MEDICATIONS:  Current Outpatient Medications  Medication Sig Dispense Refill   amLODipine (NORVASC) 10 MG tablet Take 10 mg by mouth daily.     Ascorbic Acid (VITAMIN C) 1000 MG tablet Take 1,000 mg by mouth daily.     aspirin EC 81 MG tablet Take 81 mg by mouth daily.     atorvastatin (LIPITOR) 10 MG tablet Take 10 mg by mouth daily.     bisacodyl (DULCOLAX) 5 MG EC tablet Take 5 mg by mouth daily as needed for moderate constipation.     carvedilol (COREG) 3.125 MG tablet Take 1 tablet (3.125 mg total) by mouth 2 (two) times daily with a meal. 180 tablet 0   cholecalciferol (VITAMIN D3) 25 MCG (1000 UNIT) tablet Take 1,000 Units by mouth daily.     Cyanocobalamin (VITAMIN B 12 PO) Take 1 tablet by mouth daily.     dapagliflozin propanediol (FARXIGA) 10 MG TABS tablet Take 1 tablet (10 mg total) by mouth daily. 90 tablet 1   folic acid (FOLVITE) 1 MG tablet Take 1 tablet (1 mg total) by mouth daily. Pt needs to pick up today and start ASAP . He will pay out of pocket. He is waiting on the [place]  to send his folic acid which will be next week. 7 tablet 0   furosemide (LASIX) 20 MG tablet Take 1 tablet (20 mg total) by mouth daily. As needed 20 tablet 0   lidocaine-prilocaine (EMLA) cream Apply to Port-A-Cath site 30-60-minute before treatment. 30 g 0   magic mouthwash SOLN Take 5 mLs by mouth 4 (four) times daily as needed for mouth pain. Swish and swallow or spit 240 mL 2   meclizine (ANTIVERT) 12.5 MG tablet Take 1 tablet (12.5 mg total) by mouth 3 (three) times daily as needed for dizziness. 90 tablet 0   nicotine (NICODERM CQ) 21 mg/24hr patch Place 1 patch (21 mg total) onto the skin daily. 28 patch 3   nicotine polacrilex (COMMIT) 4 MG lozenge Take 1 lozenge (4 mg total) by mouth as  needed for smoking cessation. 108 tablet 3   potassium chloride SA (KLOR-CON) 20 MEQ tablet Take 1 tablet (20 mEq total) by mouth daily. 10 tablet 0   pregabalin (LYRICA) 25 MG capsule Take 1 capsule (25 mg total) by mouth daily. 30 capsule 1   Propylene Glycol (SYSTANE BALANCE OP) Place 1 drop into both eyes daily.     sildenafil (VIAGRA) 100 MG tablet TAKE ONE TABLET BY MOUTH AS INSTRUCTED (TAKE 1 HOUR PRIOR TO SEXUAL ACTIVITY *DO NOT EXCEED 1 DOSE PER 24 HOUR PERIOD*)     triamterene-hydrochlorothiazide (MAXZIDE-25) 37.5-25 MG tablet Take 1 tablet by mouth daily.     zinc gluconate 50 MG tablet Take 50 mg by mouth daily.     No current facility-administered medications for this visit.    SURGICAL HISTORY:  Past Surgical History:  Procedure Laterality Date   BIOPSY OF MEDIASTINAL MASS  12/10/2019   Procedure: BIOPSY OF MEDIASTINAL MASS;  Surgeon: Garner Nash, DO;  Location: [facility];  Service: Pulmonary;;  right upper lobe   BRONCHIAL NEEDLE ASPIRATION BIOPSY  12/10/2019   Procedure: BRONCHIAL NEEDLE ASPIRATION BIOPSIES;  Surgeon: Garner Nash, DO;  Location: [facility];  Service: Pulmonary;;   COLONOSCOPY     IR IMAGING GUIDED PORT INSERTION  12/31/2019   kidney stone     URETERAL STENT PLACEMENT     Ureteral Stent Removed     VIDEO BRONCHOSCOPY WITH ENDOBRONCHIAL ULTRASOUND N/A 12/10/2019   Procedure: VIDEO BRONCHOSCOPY WITH ENDOBRONCHIAL ULTRASOUND;  Surgeon: Garner Nash, DO;  Location: [facility];  Service: Pulmonary;  Laterality: N/A;    REVIEW OF SYSTEMS:  Constitutional: positive for fatigue Eyes: negative Ears, nose, mouth, throat, and face: negative Respiratory: negative Cardiovascular: negative Gastrointestinal: negative Genitourinary:negative Integument/breast: negative Hematologic/lymphatic: negative Musculoskeletal:positive for arthralgias Neurological: negative Behavioral/Psych: negative Endocrine: negative Allergic/Immunologic: negative    PHYSICAL EXAMINATION: General appearance: alert, cooperative, fatigued, and no distress Head: Normocephalic, without obvious abnormality, atraumatic Neck: no adenopathy, no JVD, supple, symmetrical, trachea midline, and thyroid not enlarged, symmetric, no tenderness/mass/nodules Lymph nodes: Cervical, supraclavicular, and axillary nodes normal. Resp: clear to auscultation bilaterally Back: symmetric, no curvature. ROM normal. No CVA tenderness. Cardio: regular rate and rhythm, S1, S2 normal, no murmur, click, rub or gallop GI: soft, non-tender; bowel sounds normal; no masses,  no organomegaly Extremities: extremities normal, atraumatic, no cyanosis or edema Neurologic: Alert and oriented X 3, normal strength and tone. Normal symmetric reflexes. Normal coordination and gait  ECOG PERFORMANCE STATUS: 1 - Symptomatic but completely ambulatory  Blood pressure (!) 154/84, pulse 75, temperature 98.9 F (37.2 C), temperature source Oral, resp. rate 17, weight 226 lb 11.2 oz (102.8 kg), SpO2  99 %.  LABORATORY DATA: Lab Results  Component Value Date   WBC 6.4 06/03/2021   HGB 12.6 (L) 06/03/2021   HCT 36.4 (L) 06/03/2021   MCV 92.4 06/03/2021   PLT 218 06/03/2021      Chemistry      Component Value Date/Time   NA 137 05/06/2021 1021   NA 138 01/08/2021 0955   K 4.7 05/06/2021 1021   CL 106 05/06/2021 1021   CO2 23 05/06/2021 1021   BUN 37 (H) 05/06/2021 1021   BUN 28 (H) 01/08/2021 0955   CREATININE 2.18 (H) 05/06/2021 1021      Component Value Date/Time   CALCIUM 9.6 05/06/2021 1021   ALKPHOS 63 05/06/2021 1021   AST 15 05/06/2021 1021   ALT 17 05/06/2021 1021   BILITOT 0.3 05/06/2021 1021       RADIOGRAPHIC STUDIES: CT Abdomen Pelvis Wo Contrast  Result Date: 05/25/2021 CLINICAL DATA:  Primary Cancer Type: Lung Imaging Indication: Assess response to therapy Interval therapy since last imaging? Yes Initial Cancer Diagnosis Date: 12/10/2019; Established by: Biopsy-proven  Detailed Pathology: Stage IV non-small cell lung cancer, adenocarcinoma. Primary Tumor location:  Right upper lobe. Surgeries: No. Chemotherapy: Yes; Ongoing? No; Most recent administration: 08/24/2020 Immunotherapy?  Yes; Type: Keytruda; Ongoing? Yes Radiation therapy? No EXAM: CT CHEST, ABDOMEN AND PELVIS WITHOUT CONTRAST TECHNIQUE: Multidetector CT imaging of the chest, abdomen and pelvis was performed following the standard protocol without IV contrast. COMPARISON:  Most recent CT chest, abdomen and pelvis 03/02/2021. 12/09/2019 PET-CT. FINDINGS: CT CHEST FINDINGS Cardiovascular: Normal heart size. No significant pericardial effusion/thickening. Left anterior descending and right coronary atherosclerosis. Right internal jugular Port-A-Cath terminates at cavoatrial junction. Atherosclerotic nonaneurysmal thoracic aorta. Top-normal caliber main pulmonary artery (3.1 cm diameter), stable. Mediastinum/Nodes: No discrete thyroid nodules. Unremarkable esophagus. No pathologically enlarged axillary, mediastinal or hilar lymph nodes, noting limited sensitivity for the detection of hilar adenopathy on this noncontrast study. Lungs/Pleura: No pneumothorax. No pleural effusion. Mild centrilobular and paraseptal emphysema with mild diffuse bronchial wall thickening. Spiculated solid 2.5 x 2.3 cm posterior right upper lobe pulmonary nodule (series 6/image 47), previously 2.7 x 2.4 cm on 03/02/2021 CT using similar measurement technique, stable to slightly decreased. No acute consolidative airspace disease. No new significant pulmonary nodules. Musculoskeletal: No aggressive appearing focal osseous lesions. Marked thoracic spondylosis. CT ABDOMEN PELVIS FINDINGS Hepatobiliary: Normal liver with no liver mass. Normal gallbladder with no radiopaque cholelithiasis. No biliary ductal dilatation. Pancreas: Normal, with no mass or duct dilation. Spleen: Normal size. No mass. Adrenals/Urinary Tract: Stable 2.3 cm right adrenal  nodule with density 7 HU, compatible with an adenoma. Stable left adrenal gland without discrete left adrenal nodule. Nonobstructing 6 mm lower left renal stone. No hydronephrosis. Simple 1.4 cm posterior upper right renal cyst. No additional contour deforming renal lesions. Chronic mild diffuse bladder wall thickening. Stomach/Bowel: Normal non-distended stomach. Normal caliber small bowel with no small bowel wall thickening. Normal appendix. Oral contrast transits to the colon. Normal large bowel with no diverticulosis, large bowel wall thickening or pericolonic fat stranding. Vascular/Lymphatic: Atherosclerotic nonaneurysmal abdominal aorta. No pathologically enlarged lymph nodes in the abdomen or pelvis. Reproductive: Top-normal size prostate. Other: No pneumoperitoneum, ascites or focal fluid collection. Small fat containing umbilical hernia. Musculoskeletal: No aggressive appearing focal osseous lesions. Marked lumbar spondylosis. IMPRESSION: 1. Spiculated solid 2.5 cm posterior right upper lobe pulmonary nodule is stable to slightly decreased in size. 2. No evidence of recurrent metastatic disease in the chest. No evidence of metastatic disease in  the abdomen or pelvis. 3. Chronic findings include: Two-vessel coronary atherosclerosis. Stable right adrenal adenoma. Nonobstructing left nephrolithiasis. Chronic mild diffuse bladder wall thickening. Aortic Atherosclerosis (ICD10-I70.0) and Emphysema (ICD10-J43.9). Electronically Signed   By: Ilona Sorrel M.D.   On: 05/25/2021 11:43   CT Chest Wo Contrast  Result Date: 05/25/2021 CLINICAL DATA:  Primary Cancer Type: Lung Imaging Indication: Assess response to therapy Interval therapy since last imaging? Yes Initial Cancer Diagnosis Date: 12/10/2019; Established by: Biopsy-proven Detailed Pathology: Stage IV non-small cell lung cancer, adenocarcinoma. Primary Tumor location:  Right upper lobe. Surgeries: No. Chemotherapy: Yes; Ongoing? No; Most recent  administration: 08/24/2020 Immunotherapy?  Yes; Type: Keytruda; Ongoing? Yes Radiation therapy? No EXAM: CT CHEST, ABDOMEN AND PELVIS WITHOUT CONTRAST TECHNIQUE: Multidetector CT imaging of the chest, abdomen and pelvis was performed following the standard protocol without IV contrast. COMPARISON:  Most recent CT chest, abdomen and pelvis 03/02/2021. 12/09/2019 PET-CT. FINDINGS: CT CHEST FINDINGS Cardiovascular: Normal heart size. No significant pericardial effusion/thickening. Left anterior descending and right coronary atherosclerosis. Right internal jugular Port-A-Cath terminates at cavoatrial junction. Atherosclerotic nonaneurysmal thoracic aorta. Top-normal caliber main pulmonary artery (3.1 cm diameter), stable. Mediastinum/Nodes: No discrete thyroid nodules. Unremarkable esophagus. No pathologically enlarged axillary, mediastinal or hilar lymph nodes, noting limited sensitivity for the detection of hilar adenopathy on this noncontrast study. Lungs/Pleura: No pneumothorax. No pleural effusion. Mild centrilobular and paraseptal emphysema with mild diffuse bronchial wall thickening. Spiculated solid 2.5 x 2.3 cm posterior right upper lobe pulmonary nodule (series 6/image 47), previously 2.7 x 2.4 cm on 03/02/2021 CT using similar measurement technique, stable to slightly decreased. No acute consolidative airspace disease. No new significant pulmonary nodules. Musculoskeletal: No aggressive appearing focal osseous lesions. Marked thoracic spondylosis. CT ABDOMEN PELVIS FINDINGS Hepatobiliary: Normal liver with no liver mass. Normal gallbladder with no radiopaque cholelithiasis. No biliary ductal dilatation. Pancreas: Normal, with no mass or duct dilation. Spleen: Normal size. No mass. Adrenals/Urinary Tract: Stable 2.3 cm right adrenal nodule with density 7 HU, compatible with an adenoma. Stable left adrenal gland without discrete left adrenal nodule. Nonobstructing 6 mm lower left renal stone. No hydronephrosis.  Simple 1.4 cm posterior upper right renal cyst. No additional contour deforming renal lesions. Chronic mild diffuse bladder wall thickening. Stomach/Bowel: Normal non-distended stomach. Normal caliber small bowel with no small bowel wall thickening. Normal appendix. Oral contrast transits to the colon. Normal large bowel with no diverticulosis, large bowel wall thickening or pericolonic fat stranding. Vascular/Lymphatic: Atherosclerotic nonaneurysmal abdominal aorta. No pathologically enlarged lymph nodes in the abdomen or pelvis. Reproductive: Top-normal size prostate. Other: No pneumoperitoneum, ascites or focal fluid collection. Small fat containing umbilical hernia. Musculoskeletal: No aggressive appearing focal osseous lesions. Marked lumbar spondylosis. IMPRESSION: 1. Spiculated solid 2.5 cm posterior right upper lobe pulmonary nodule is stable to slightly decreased in size. 2. No evidence of recurrent metastatic disease in the chest. No evidence of metastatic disease in the abdomen or pelvis. 3. Chronic findings include: Two-vessel coronary atherosclerosis. Stable right adrenal adenoma. Nonobstructing left nephrolithiasis. Chronic mild diffuse bladder wall thickening. Aortic Atherosclerosis (ICD10-I70.0) and Emphysema (ICD10-J43.9). Electronically Signed   By: Ilona Sorrel M.D.   On: 05/25/2021 11:43     ASSESSMENT AND PLAN: This is a very pleasant 65 years old African-American male recently diagnosed with a stage IV non-small cell lung cancer, adenocarcinoma with no actionable mutations presented with large right lower lobe lung mass in addition to mediastinal and right supraclavicular lymphadenopathy as well as bilateral pulmonary nodules diagnosed in February 2021. I explained  to the patient that he has incurable condition and all the treatment options will be of palliative nature. The patient has no actionable mutations on the recent molecular studies and he is not a candidate for the [facility]  clinical trial. He started systemic chemotherapy with carboplatin, Alimta and Keytruda status post 23 cycles.  Starting from cycle #5 the patient is on maintenance treatment with Alimta and Keytruda every 3 weeks.  He has been recently on single agent Keytruda for several cycles secondary to renal insufficiency. The patient has been tolerating this treatment with single agent Keytruda fairly well. He had repeat CT scan of the chest, abdomen pelvis performed recently.  I personally and independently reviewed the scans and discussed the results with the patient today. His scan showed no concerning findings for disease progression. I recommended for him to continue his current treatment with [facility] and he will proceed with cycle #24 today. The patient will come back for follow-up visit in 3 weeks for evaluation before the next cycle of his treatment. For the hypertension he was advised to take his blood pressure medication as prescribed and to monitor it closely at home. The patient was advised to call immediately if he has any other concerning symptoms in the interval. The patient voices understanding of current disease status and treatment options and is in agreement with the current care plan.  All questions were answered. The patient knows to call the clinic with any problems, questions or concerns. We can certainly see the patient much sooner if necessary.  Disclaimer: This note was dictated with voice recognition software. Similar sounding words can inadvertently be transcribed and may not be corrected upon review.

## 2021-06-13 NOTE — Progress Notes (Addendum)
[facility] OFFICE PROGRESS NOTE  Kevin Freshwater, NP [place] [id number]  DIAGNOSIS:  Stage IV (T2b, N3, M1a) non-small cell lung cancer, adenocarcinoma presented with large right upper lobe lung mass in addition to mediastinal and right supraclavicular lymphadenopathy as well as bilateral pulmonary nodules diagnosed in February 2021.   MOLECULAR STUDY by [employer]:   KRASG12C, 4.3%, Binimetinib   [id number], 0.9%, Niraparib, Olaparib, Rucaparib,Talazoparib, Tazemetostat   [id number], 1.7%, None   PRIOR THERAPY: None  CURRENT THERAPY: Systemic chemotherapy with carboplatin for AUC of 5, Alimta 500 mg/M2 and Keytruda 200 mg IV every 3 weeks.  First dose December 31, 2019. Status post 24 cycles. Starting from cycle #5, he will be on maintenance Alimta and Keytruda. Alimta was discontinued due to renal insufficiency.  INTERVAL HISTORY: Kevin Lambert. 65 y.o. male returns to the clinic today for a follow up visit. The patient is feeling fine today without any concerning complaints. The patient tolerated his last cycle of treatment well without any adverse side effects. He had a cold recently but has recovered. Alimta was discontinued due renal insufficiency. The patient denies any recent fever or chills. He has mild night sweats every now and then. His weight is stable. He reports his baseline dyspnea on exertion for which he uses an inhaler. He notes his shortness of breath unchanged. He denies significant cough. He denies chest pain or hemoptysis.  He denies any nausea, vomiting, constipation, or diarrhea.  He denies any rashes or skin changes. He denies headaches or visual changes. He forgot to take his blood pressure today but denies any symptoms. The patient is here today for evaluation before starting cycle #25.   ADDENDUM: His last chemotherapy treatment was on 06/03/21 which was 2 weeks ago. He receives treatment every 3 weeks. He is scheduled  1 week too early. He is due for cycle #25 on 06/24/21.  MEDICAL HISTORY: Past Medical History:  Diagnosis Date   Arthritis    Chronic kidney disease    Depression    DM (diabetes mellitus) ([place])    type II   Dyslipidemia    Dyspnea    unable to walk much - he thinks it is from    GERD (gastroesophageal reflux disease)    Hepatitis    Hepatitis C- treated   History of kidney stones    HTN (hypertension)    Neuropathy    nscl ca dx'd 10/2019   OSA on CPAP    PTSD (post-traumatic stress disorder)     ALLERGIES:  is allergic to metformin and related, naproxen, and oxycontin [oxycodone].  MEDICATIONS:  Current Outpatient Medications  Medication Sig Dispense Refill   amLODipine (NORVASC) 10 MG tablet Take 10 mg by mouth daily.     Ascorbic Acid (VITAMIN C) 1000 MG tablet Take 1,000 mg by mouth daily.     aspirin EC 81 MG tablet Take 81 mg by mouth daily.     atorvastatin (LIPITOR) 10 MG tablet Take 10 mg by mouth daily.     bisacodyl (DULCOLAX) 5 MG EC tablet Take 5 mg by mouth daily as needed for moderate constipation.     carvedilol (COREG) 3.125 MG tablet Take 1 tablet (3.125 mg total) by mouth 2 (two) times daily with a meal. 180 tablet 0   cholecalciferol (VITAMIN D3) 25 MCG (1000 UNIT) tablet Take 1,000 Units by mouth daily.     Cyanocobalamin (VITAMIN B 12 PO) Take 1 tablet  by mouth daily.     dapagliflozin propanediol (FARXIGA) 10 MG TABS tablet Take 1 tablet (10 mg total) by mouth daily. 90 tablet 1   folic acid (FOLVITE) 1 MG tablet Take 1 tablet (1 mg total) by mouth daily. Pt needs to pick up today and start ASAP . He will pay out of pocket. He is waiting on the [place] to send his folic acid which will be next week. 7 tablet 0   furosemide (LASIX) 20 MG tablet Take 1 tablet (20 mg total) by mouth daily. As needed 20 tablet 0   lidocaine-prilocaine (EMLA) cream Apply to Port-A-Cath site 30-60-minute before treatment. 30 g 0   magic mouthwash SOLN Take 5 mLs by mouth 4 (four)  times daily as needed for mouth pain. Swish and swallow or spit 240 mL 2   meclizine (ANTIVERT) 12.5 MG tablet Take 1 tablet (12.5 mg total) by mouth 3 (three) times daily as needed for dizziness. 90 tablet 0   nicotine (NICODERM CQ) 21 mg/24hr patch Place 1 patch (21 mg total) onto the skin daily. 28 patch 3   nicotine polacrilex (COMMIT) 4 MG lozenge Take 1 lozenge (4 mg total) by mouth as needed for smoking cessation. 108 tablet 3   potassium chloride SA (KLOR-CON) 20 MEQ tablet Take 1 tablet (20 mEq total) by mouth daily. 10 tablet 0   pregabalin (LYRICA) 25 MG capsule Take 1 capsule (25 mg total) by mouth daily. 30 capsule 1   Propylene Glycol (SYSTANE BALANCE OP) Place 1 drop into both eyes daily.     sildenafil (VIAGRA) 100 MG tablet TAKE ONE TABLET BY MOUTH AS INSTRUCTED (TAKE 1 HOUR PRIOR TO SEXUAL ACTIVITY *DO NOT EXCEED 1 DOSE PER 24 HOUR PERIOD*)     triamterene-hydrochlorothiazide (MAXZIDE-25) 37.5-25 MG tablet Take 1 tablet by mouth daily.     zinc gluconate 50 MG tablet Take 50 mg by mouth daily.     No current facility-administered medications for this visit.   Facility-Administered Medications Ordered in Other Visits  Medication Dose Route Frequency Provider Last Rate Last Admin   heparin lock flush 100 unit/mL  500 Units Intracatheter Once PRN Curt Bears, MD       pembrolizumab [facility]) 200 mg in sodium chloride 0.9 % 50 mL chemo infusion  200 mg Intravenous Once Curt Bears, MD       sodium chloride flush (NS) 0.9 % injection 10 mL  10 mL Intracatheter PRN Curt Bears, MD        SURGICAL HISTORY:  Past Surgical History:  Procedure Laterality Date   BIOPSY OF MEDIASTINAL MASS  12/10/2019   Procedure: BIOPSY OF MEDIASTINAL MASS;  Surgeon: Garner Nash, DO;  Location: [facility];  Service: Pulmonary;;  right upper lobe   BRONCHIAL NEEDLE ASPIRATION BIOPSY  12/10/2019   Procedure: BRONCHIAL NEEDLE ASPIRATION BIOPSIES;  Surgeon: Garner Nash, DO;   Location: [facility];  Service: Pulmonary;;   COLONOSCOPY     IR IMAGING GUIDED PORT INSERTION  12/31/2019   kidney stone     URETERAL STENT PLACEMENT     Ureteral Stent Removed     VIDEO BRONCHOSCOPY WITH ENDOBRONCHIAL ULTRASOUND N/A 12/10/2019   Procedure: VIDEO BRONCHOSCOPY WITH ENDOBRONCHIAL ULTRASOUND;  Surgeon: Garner Nash, DO;  Location: [facility];  Service: Pulmonary;  Laterality: N/A;    REVIEW OF SYSTEMS:   Constitutional: Positive for fatigue. Negative for appetite change, chills, fever and unexpected weight change. HENT: Negative for mouth sores, sore throat and trouble  swallowing.   Eyes: Positive for red eyes. Negative for eye problems and icterus. Respiratory: Positive for baseline shortness of breath with exertion (stable). Negative for cough, hemoptysis, and wheezing.   Cardiovascular: Negative for chest pain no lower extremity swelling. Gastrointestinal: Negative for diarrhea, constipation, vomiting, or nausea. Genitourinary: Negative for bladder incontinence, difficulty urinating, dysuria, frequency and hematuria.   Musculoskeletal: Negative for back pain, gait problem, neck pain and neck stiffness. Skin: Negative for rash or itching.  Neurological:  Negative for extremity weakness, gait problem, headaches, light-headedness and seizures. Hematological: Negative for adenopathy. Does not bruise/bleed easily. Psychiatric/Behavioral: Negative for confusion, depression and sleep disturbance. The patient is not nervous/anxious.     PHYSICAL EXAMINATION:  Blood pressure (!) 166/81, pulse 77, temperature 97.6 F (36.4 C), temperature source Tympanic, resp. rate 20, weight 226 lb 11.2 oz (102.8 kg), SpO2 99 %.  ECOG PERFORMANCE STATUS: 1  Physical Exam  Constitutional: Oriented to person, place, and time and well-developed, well-nourished, and in no distress. HENT: Head: Normocephalic and atraumatic. Mouth/Throat: Oropharynx is clear and moist. No oropharyngeal  exudate. Eyes: Conjunctivae are red bilaterally. Right eye exhibits no discharge. Left eye exhibits no discharge. No scleral icterus. Neck: Normal range of motion. Neck supple. Cardiovascular: Normal rate, regular rhythm, normal heart sounds and intact distal pulses.   Pulmonary/Chest: Effort normal and breath sounds normal. No respiratory distress. No wheezes. No rales. Abdominal: Soft. Bowel sounds are normal. Exhibits no distension and no mass. There is no tenderness.  Musculoskeletal: Normal range of motion. Exhibits no edema.  Lymphadenopathy:    No cervical adenopathy.  Neurological: Alert and oriented to person, place, and time. Exhibits normal muscle tone. Gait normal. Coordination normal. Skin: Skin is warm and dry. No rash noted. Not diaphoretic. No erythema. No pallor.  Psychiatric: Mood, memory and judgment normal. Vitals reviewed.  LABORATORY DATA: Lab Results  Component Value Date   WBC 5.9 06/16/2021   HGB 12.4 (L) 06/16/2021   HCT 35.2 (L) 06/16/2021   MCV 93.4 06/16/2021   PLT 149 (L) 06/16/2021      Chemistry      Component Value Date/Time   NA 134 (L) 06/16/2021 1400   NA 138 01/08/2021 0955   K 4.5 06/16/2021 1400   CL 101 06/16/2021 1400   CO2 24 06/16/2021 1400   BUN 33 (H) 06/16/2021 1400   BUN 28 (H) 01/08/2021 0955   CREATININE 2.20 (H) 06/16/2021 1400      Component Value Date/Time   CALCIUM 10.0 06/16/2021 1400   ALKPHOS 66 06/16/2021 1400   AST 24 06/16/2021 1400   ALT 22 06/16/2021 1400   BILITOT 0.6 06/16/2021 1400       RADIOGRAPHIC STUDIES:  CT Abdomen Pelvis Wo Contrast  Result Date: 05/25/2021 CLINICAL DATA:  Primary Cancer Type: Lung Imaging Indication: Assess response to therapy Interval therapy since last imaging? Yes Initial Cancer Diagnosis Date: 12/10/2019; Established by: Biopsy-proven Detailed Pathology: Stage IV non-small cell lung cancer, adenocarcinoma. Primary Tumor location:  Right upper lobe. Surgeries: No.  Chemotherapy: Yes; Ongoing? No; Most recent administration: 08/24/2020 Immunotherapy?  Yes; Type: Keytruda; Ongoing? Yes Radiation therapy? No EXAM: CT CHEST, ABDOMEN AND PELVIS WITHOUT CONTRAST TECHNIQUE: Multidetector CT imaging of the chest, abdomen and pelvis was performed following the standard protocol without IV contrast. COMPARISON:  Most recent CT chest, abdomen and pelvis 03/02/2021. 12/09/2019 PET-CT. FINDINGS: CT CHEST FINDINGS Cardiovascular: Normal heart size. No significant pericardial effusion/thickening. Left anterior descending and right coronary atherosclerosis. Right internal jugular Port-A-Cath terminates  at cavoatrial junction. Atherosclerotic nonaneurysmal thoracic aorta. Top-normal caliber main pulmonary artery (3.1 cm diameter), stable. Mediastinum/Nodes: No discrete thyroid nodules. Unremarkable esophagus. No pathologically enlarged axillary, mediastinal or hilar lymph nodes, noting limited sensitivity for the detection of hilar adenopathy on this noncontrast study. Lungs/Pleura: No pneumothorax. No pleural effusion. Mild centrilobular and paraseptal emphysema with mild diffuse bronchial wall thickening. Spiculated solid 2.5 x 2.3 cm posterior right upper lobe pulmonary nodule (series 6/image 47), previously 2.7 x 2.4 cm on 03/02/2021 CT using similar measurement technique, stable to slightly decreased. No acute consolidative airspace disease. No new significant pulmonary nodules. Musculoskeletal: No aggressive appearing focal osseous lesions. Marked thoracic spondylosis. CT ABDOMEN PELVIS FINDINGS Hepatobiliary: Normal liver with no liver mass. Normal gallbladder with no radiopaque cholelithiasis. No biliary ductal dilatation. Pancreas: Normal, with no mass or duct dilation. Spleen: Normal size. No mass. Adrenals/Urinary Tract: Stable 2.3 cm right adrenal nodule with density 7 HU, compatible with an adenoma. Stable left adrenal gland without discrete left adrenal nodule. Nonobstructing 6  mm lower left renal stone. No hydronephrosis. Simple 1.4 cm posterior upper right renal cyst. No additional contour deforming renal lesions. Chronic mild diffuse bladder wall thickening. Stomach/Bowel: Normal non-distended stomach. Normal caliber small bowel with no small bowel wall thickening. Normal appendix. Oral contrast transits to the colon. Normal large bowel with no diverticulosis, large bowel wall thickening or pericolonic fat stranding. Vascular/Lymphatic: Atherosclerotic nonaneurysmal abdominal aorta. No pathologically enlarged lymph nodes in the abdomen or pelvis. Reproductive: Top-normal size prostate. Other: No pneumoperitoneum, ascites or focal fluid collection. Small fat containing umbilical hernia. Musculoskeletal: No aggressive appearing focal osseous lesions. Marked lumbar spondylosis. IMPRESSION: 1. Spiculated solid 2.5 cm posterior right upper lobe pulmonary nodule is stable to slightly decreased in size. 2. No evidence of recurrent metastatic disease in the chest. No evidence of metastatic disease in the abdomen or pelvis. 3. Chronic findings include: Two-vessel coronary atherosclerosis. Stable right adrenal adenoma. Nonobstructing left nephrolithiasis. Chronic mild diffuse bladder wall thickening. Aortic Atherosclerosis (ICD10-I70.0) and Emphysema (ICD10-J43.9). Electronically Signed   By: Ilona Sorrel M.D.   On: 05/25/2021 11:43   CT Chest Wo Contrast  Result Date: 05/25/2021 CLINICAL DATA:  Primary Cancer Type: Lung Imaging Indication: Assess response to therapy Interval therapy since last imaging? Yes Initial Cancer Diagnosis Date: 12/10/2019; Established by: Biopsy-proven Detailed Pathology: Stage IV non-small cell lung cancer, adenocarcinoma. Primary Tumor location:  Right upper lobe. Surgeries: No. Chemotherapy: Yes; Ongoing? No; Most recent administration: 08/24/2020 Immunotherapy?  Yes; Type: Keytruda; Ongoing? Yes Radiation therapy? No EXAM: CT CHEST, ABDOMEN AND PELVIS WITHOUT  CONTRAST TECHNIQUE: Multidetector CT imaging of the chest, abdomen and pelvis was performed following the standard protocol without IV contrast. COMPARISON:  Most recent CT chest, abdomen and pelvis 03/02/2021. 12/09/2019 PET-CT. FINDINGS: CT CHEST FINDINGS Cardiovascular: Normal heart size. No significant pericardial effusion/thickening. Left anterior descending and right coronary atherosclerosis. Right internal jugular Port-A-Cath terminates at cavoatrial junction. Atherosclerotic nonaneurysmal thoracic aorta. Top-normal caliber main pulmonary artery (3.1 cm diameter), stable. Mediastinum/Nodes: No discrete thyroid nodules. Unremarkable esophagus. No pathologically enlarged axillary, mediastinal or hilar lymph nodes, noting limited sensitivity for the detection of hilar adenopathy on this noncontrast study. Lungs/Pleura: No pneumothorax. No pleural effusion. Mild centrilobular and paraseptal emphysema with mild diffuse bronchial wall thickening. Spiculated solid 2.5 x 2.3 cm posterior right upper lobe pulmonary nodule (series 6/image 47), previously 2.7 x 2.4 cm on 03/02/2021 CT using similar measurement technique, stable to slightly decreased. No acute consolidative airspace disease. No new significant pulmonary nodules. Musculoskeletal:  No aggressive appearing focal osseous lesions. Marked thoracic spondylosis. CT ABDOMEN PELVIS FINDINGS Hepatobiliary: Normal liver with no liver mass. Normal gallbladder with no radiopaque cholelithiasis. No biliary ductal dilatation. Pancreas: Normal, with no mass or duct dilation. Spleen: Normal size. No mass. Adrenals/Urinary Tract: Stable 2.3 cm right adrenal nodule with density 7 HU, compatible with an adenoma. Stable left adrenal gland without discrete left adrenal nodule. Nonobstructing 6 mm lower left renal stone. No hydronephrosis. Simple 1.4 cm posterior upper right renal cyst. No additional contour deforming renal lesions. Chronic mild diffuse bladder wall thickening.  Stomach/Bowel: Normal non-distended stomach. Normal caliber small bowel with no small bowel wall thickening. Normal appendix. Oral contrast transits to the colon. Normal large bowel with no diverticulosis, large bowel wall thickening or pericolonic fat stranding. Vascular/Lymphatic: Atherosclerotic nonaneurysmal abdominal aorta. No pathologically enlarged lymph nodes in the abdomen or pelvis. Reproductive: Top-normal size prostate. Other: No pneumoperitoneum, ascites or focal fluid collection. Small fat containing umbilical hernia. Musculoskeletal: No aggressive appearing focal osseous lesions. Marked lumbar spondylosis. IMPRESSION: 1. Spiculated solid 2.5 cm posterior right upper lobe pulmonary nodule is stable to slightly decreased in size. 2. No evidence of recurrent metastatic disease in the chest. No evidence of metastatic disease in the abdomen or pelvis. 3. Chronic findings include: Two-vessel coronary atherosclerosis. Stable right adrenal adenoma. Nonobstructing left nephrolithiasis. Chronic mild diffuse bladder wall thickening. Aortic Atherosclerosis (ICD10-I70.0) and Emphysema (ICD10-J43.9). Electronically Signed   By: Ilona Sorrel M.D.   On: 05/25/2021 11:43     ASSESSMENT/PLAN:  This is a very pleasant 65 year old African-American male diagnosed with stage IV non-small cell lung cancer, adenocarcinoma.  He presented with a large right upper lobe lung mass with right hilar, subcarinal, paratracheal lymphadenopathy, and right supraclavicular lymphadenopathy.  He also presented with bilateral pulmonary nodules.  He was diagnosed in February 2021.  He does not have any actionable mutations.     The patient is currently undergoing palliative systemic chemotherapy with carboplatin for an AUC of 5, Alimta 500 mg/m, Keytruda 200 mg IV every 3 weeks.  He is status post 24 cycles and he tolerated it well without any adverse side effects. Starting from cycle #5, the patient has been on maintenance alimta and  [place].  Alimta was ultimately discontinued due to renal insufficiency.    Labs were reviewed. His creatinine is 2.2 today. He has baseline CKD which fluctuates. He states he drinks plenty of fluids. Discussed optimal BP and BS control. Advised for him to take his BP meds upon returning home today as his BP is elevated today at 166/88. Reviewed signs and symptoms of hypertension such as headaches, visual changes, light headedness, weakness, or chest pain. Advised to seek medical attention if he develops concerning symptoms. Moving forward, would advise him to take his BP meds prior to his appointments.    ADDENDUM: His last chemotherapy treatment was on 06/03/21 which was 2 weeks ago. He receives treatment every 3 weeks. He is scheduled 1 week too early. He is due for cycle #25 on 06/24/21. We will delay treatment by 1 week. He does not need a follow up appointment at that time since he was seen today. However, we will see him in 4 weeks with cycle #26.   The patient was advised to call immediately if he has any concerning symptoms in the interval. The patient voices understanding of current disease status and treatment options and is in agreement with the current care plan. All questions were answered. The patient knows to call  the clinic with any problems, questions or concerns. We can certainly see the patient much sooner if necessary        No orders of the defined types were placed in this encounter.    The total time spent in the appointment was 20-29 minutes.   Kevin Nestor L Sura [place]ul, PA-C 06/16/21

## 2021-06-15 ENCOUNTER — Other Ambulatory Visit: Payer: Self-pay | Admitting: Internal Medicine

## 2021-06-15 DIAGNOSIS — C3491 Malignant neoplasm of unspecified part of right bronchus or lung: Secondary | ICD-10-CM

## 2021-06-16 ENCOUNTER — Inpatient Hospital Stay: Payer: No Typology Code available for payment source | Attending: Internal Medicine | Admitting: Physician Assistant

## 2021-06-16 ENCOUNTER — Inpatient Hospital Stay: Payer: No Typology Code available for payment source

## 2021-06-16 ENCOUNTER — Other Ambulatory Visit: Payer: Self-pay

## 2021-06-16 VITALS — BP 166/81 | HR 77 | Temp 97.6°F | Resp 20 | Wt 226.7 lb

## 2021-06-16 DIAGNOSIS — C3491 Malignant neoplasm of unspecified part of right bronchus or lung: Secondary | ICD-10-CM | POA: Diagnosis not present

## 2021-06-16 DIAGNOSIS — Z5112 Encounter for antineoplastic immunotherapy: Secondary | ICD-10-CM | POA: Diagnosis not present

## 2021-06-16 DIAGNOSIS — C3411 Malignant neoplasm of upper lobe, right bronchus or lung: Secondary | ICD-10-CM | POA: Insufficient documentation

## 2021-06-16 DIAGNOSIS — Z79899 Other long term (current) drug therapy: Secondary | ICD-10-CM | POA: Insufficient documentation

## 2021-06-16 DIAGNOSIS — Z95828 Presence of other vascular implants and grafts: Secondary | ICD-10-CM

## 2021-06-16 LAB — CBC WITH DIFFERENTIAL (CANCER CENTER ONLY)
Abs Immature Granulocytes: 0.02 10*3/uL (ref 0.00–0.07)
Basophils Absolute: 0 10*3/uL (ref 0.0–0.1)
Basophils Relative: 1 %
Eosinophils Absolute: 0.3 10*3/uL (ref 0.0–0.5)
Eosinophils Relative: 5 %
HCT: 35.2 % — ABNORMAL LOW (ref 39.0–52.0)
Hemoglobin: 12.4 g/dL — ABNORMAL LOW (ref 13.0–17.0)
Immature Granulocytes: 0 %
Lymphocytes Relative: 29 %
Lymphs Abs: 1.7 10*3/uL (ref 0.7–4.0)
MCH: 32.9 pg (ref 26.0–34.0)
MCHC: 35.2 g/dL (ref 30.0–36.0)
MCV: 93.4 fL (ref 80.0–100.0)
Monocytes Absolute: 0.7 10*3/uL (ref 0.1–1.0)
Monocytes Relative: 13 %
Neutro Abs: 3.1 10*3/uL (ref 1.7–7.7)
Neutrophils Relative %: 52 %
Platelet Count: 149 10*3/uL — ABNORMAL LOW (ref 150–400)
RBC: 3.77 MIL/uL — ABNORMAL LOW (ref 4.22–5.81)
RDW: 15.4 % (ref 11.5–15.5)
WBC Count: 5.9 10*3/uL (ref 4.0–10.5)
nRBC: 0 % (ref 0.0–0.2)

## 2021-06-16 LAB — CMP (CANCER CENTER ONLY)
ALT: 22 U/L (ref 0–44)
AST: 24 U/L (ref 15–41)
Albumin: 3.7 g/dL (ref 3.5–5.0)
Alkaline Phosphatase: 66 U/L (ref 38–126)
Anion gap: 9 (ref 5–15)
BUN: 33 mg/dL — ABNORMAL HIGH (ref 8–23)
CO2: 24 mmol/L (ref 22–32)
Calcium: 10 mg/dL (ref 8.9–10.3)
Chloride: 101 mmol/L (ref 98–111)
Creatinine: 2.2 mg/dL — ABNORMAL HIGH (ref 0.61–1.24)
GFR, Estimated: 32 mL/min — ABNORMAL LOW
Glucose, Bld: 106 mg/dL — ABNORMAL HIGH (ref 70–99)
Potassium: 4.5 mmol/L (ref 3.5–5.1)
Sodium: 134 mmol/L — ABNORMAL LOW (ref 135–145)
Total Bilirubin: 0.6 mg/dL (ref 0.3–1.2)
Total Protein: 6.8 g/dL (ref 6.5–8.1)

## 2021-06-16 LAB — TSH: TSH: 1.731 u[IU]/mL (ref 0.320–4.118)

## 2021-06-16 MED ORDER — SODIUM CHLORIDE 0.9% FLUSH
10.0000 mL | INTRAVENOUS | Status: DC | PRN
  Administered 2021-06-16: 10 mL
  Filled 2021-06-16: qty 10

## 2021-06-16 MED ORDER — SODIUM CHLORIDE 0.9 % IV SOLN
Freq: Once | INTRAVENOUS | Status: AC
  Filled 2021-06-16: qty 250

## 2021-06-16 MED ORDER — SODIUM CHLORIDE 0.9 % IV SOLN: 200.0000 mg | Freq: Once | INTRAVENOUS | Status: DC

## 2021-06-16 MED ORDER — SODIUM CHLORIDE 0.9% FLUSH
10.0000 mL | Freq: Once | INTRAVENOUS | Status: AC
  Administered 2021-06-16: 10 mL
  Filled 2021-06-16: qty 10

## 2021-06-16 MED ORDER — HEPARIN SOD (PORK) LOCK FLUSH 100 UNIT/ML IV SOLN
500.0000 [IU] | Freq: Once | INTRAVENOUS | Status: AC | PRN
  Administered 2021-06-16: 500 [IU]
  Filled 2021-06-16: qty 5

## 2021-06-16 NOTE — Progress Notes (Signed)
No tx today per C. Heilingoetter PA, has only been 2 weeks since last infusion.

## 2021-06-22 ENCOUNTER — Telehealth: Payer: Self-pay | Admitting: Internal Medicine

## 2021-06-22 NOTE — Telephone Encounter (Signed)
Scheduled per los. Called and left msg of appts. Mailed printout

## 2021-06-23 ENCOUNTER — Telehealth: Payer: Self-pay | Admitting: *Deleted

## 2021-06-23 ENCOUNTER — Telehealth: Payer: Self-pay

## 2021-06-23 NOTE — Telephone Encounter (Signed)
FYI 06/22/2021 Voicemail from "[place], [place] [phone].  Requesting most recent office notes.  Have patient sign release when in for chemotherapy Thursday (06/24/2021).  Fax records to [facility] no.: [phone]."

## 2021-06-23 NOTE — Telephone Encounter (Signed)
Spoke with Kevin Lambert. regarding form received from [place]. The form is is a Request for Medical Restrictions for Return to Work.. Kevin Lambert states that he has been out of work since July 18th  due to weakness and fatigue from treatments. He denies shortness of breath, cough, chest pain, nausea, vomiting, diarrhea, pain, or skin problems. He would like to return to work on September 1st due to financial needs. He states that he needs the following restrictions in place in order to return to work:  No lifting greater than 25 pounds 15 minute breaks every 2 hours A chair or stool to sit on No prolonged standing over 2 hours without a break  Provider made aware that Kevin Lambert has been out of work and of the restrictions request.

## 2021-06-24 ENCOUNTER — Other Ambulatory Visit: Payer: Self-pay

## 2021-06-24 ENCOUNTER — Inpatient Hospital Stay: Payer: No Typology Code available for payment source

## 2021-06-24 VITALS — BP 134/74 | HR 64 | Temp 98.3°F | Resp 18

## 2021-06-24 DIAGNOSIS — C3491 Malignant neoplasm of unspecified part of right bronchus or lung: Secondary | ICD-10-CM

## 2021-06-24 DIAGNOSIS — Z5112 Encounter for antineoplastic immunotherapy: Secondary | ICD-10-CM | POA: Diagnosis not present

## 2021-06-24 LAB — CBC WITH DIFFERENTIAL (CANCER CENTER ONLY)
Abs Immature Granulocytes: 0.03 10*3/uL (ref 0.00–0.07)
Basophils Absolute: 0.1 10*3/uL (ref 0.0–0.1)
Basophils Relative: 1 %
Eosinophils Absolute: 0.5 10*3/uL (ref 0.0–0.5)
Eosinophils Relative: 7 %
HCT: 39.6 % (ref 39.0–52.0)
Hemoglobin: 13.5 g/dL (ref 13.0–17.0)
Immature Granulocytes: 1 %
Lymphocytes Relative: 37 %
Lymphs Abs: 2.4 10*3/uL (ref 0.7–4.0)
MCH: 32.3 pg (ref 26.0–34.0)
MCHC: 34.1 g/dL (ref 30.0–36.0)
MCV: 94.7 fL (ref 80.0–100.0)
Monocytes Absolute: 0.9 10*3/uL (ref 0.1–1.0)
Monocytes Relative: 13 %
Neutro Abs: 2.7 10*3/uL (ref 1.7–7.7)
Neutrophils Relative %: 41 %
Platelet Count: 195 10*3/uL (ref 150–400)
RBC: 4.18 MIL/uL — ABNORMAL LOW (ref 4.22–5.81)
RDW: 14.8 % (ref 11.5–15.5)
WBC Count: 6.5 10*3/uL (ref 4.0–10.5)
nRBC: 0 % (ref 0.0–0.2)

## 2021-06-24 LAB — CMP (CANCER CENTER ONLY)
ALT: 22 U/L (ref 0–44)
AST: 16 U/L (ref 15–41)
Albumin: 3.8 g/dL (ref 3.5–5.0)
Alkaline Phosphatase: 61 U/L (ref 38–126)
Anion gap: 10 (ref 5–15)
BUN: 32 mg/dL — ABNORMAL HIGH (ref 8–23)
CO2: 23 mmol/L (ref 22–32)
Calcium: 10 mg/dL (ref 8.9–10.3)
Chloride: 100 mmol/L (ref 98–111)
Creatinine: 2.25 mg/dL — ABNORMAL HIGH (ref 0.61–1.24)
GFR, Estimated: 32 mL/min — ABNORMAL LOW (ref >60)
Glucose, Bld: 98 mg/dL (ref 70–99)
Potassium: 4.4 mmol/L (ref 3.5–5.1)
Sodium: 133 mmol/L — ABNORMAL LOW (ref 135–145)
Total Bilirubin: 0.4 mg/dL (ref 0.3–1.2)
Total Protein: 6.8 g/dL (ref 6.5–8.1)

## 2021-06-24 LAB — TSH: TSH: 1.045 u[IU]/mL (ref 0.320–4.118)

## 2021-06-24 MED ORDER — SODIUM CHLORIDE 0.9 % IV SOLN: Freq: Once | INTRAVENOUS | Status: AC

## 2021-06-24 MED ORDER — SODIUM CHLORIDE 0.9 % IV SOLN
200.0000 mg | Freq: Once | INTRAVENOUS | Status: AC
  Administered 2021-06-24: 200 mg via INTRAVENOUS
  Filled 2021-06-24: qty 8

## 2021-06-24 MED ORDER — SODIUM CHLORIDE 0.9% FLUSH
10.0000 mL | INTRAVENOUS | Status: DC | PRN
  Administered 2021-06-24: 10 mL

## 2021-06-24 MED ORDER — HEPARIN SOD (PORK) LOCK FLUSH 100 UNIT/ML IV SOLN
500.0000 [IU] | Freq: Once | INTRAVENOUS | Status: AC | PRN
  Administered 2021-06-24: 500 [IU]

## 2021-06-24 NOTE — Patient Instructions (Signed)
Implanted Port Home Guide An implanted port is a device that is placed under the skin. It is usually placed in the chest. The device can be used to give IV medicine, to take blood, or for dialysis. You may have an implanted port if: You need IV medicine that would be irritating to the small veins in your hands or arms. You need IV medicines, such as antibiotics, for a long period of time. You need IV nutrition for a long period of time. You need dialysis. When you have a port, your health care provider can choose to use the port instead of veins in your arms for these procedures. You may have fewer limitations when using a port than you would if you used other types of long-term IVs, and you will likely be able to return to normal activities afteryour incision heals. An implanted port has two main parts: Reservoir. The reservoir is the part where a needle is inserted to give medicines or draw blood. The reservoir is round. After it is placed, it appears as a small, raised area under your skin. Catheter. The catheter is a thin, flexible tube that connects the reservoir to a vein. Medicine that is inserted into the reservoir goes into the catheter and then into the vein. How is my port accessed? To access your port: A numbing cream may be placed on the skin over the port site. Your health care provider will put on a mask and sterile gloves. The skin over your port will be cleaned carefully with a germ-killing soap and allowed to dry. Your health care provider will gently pinch the port and insert a needle into it. Your health care provider will check for a blood return to make sure the port is in the vein and is not clogged. If your port needs to remain accessed to get medicine continuously (constant infusion), your health care provider will place a clear bandage (dressing) over the needle site. The dressing and needle will need to be changed every week, or as told by your health care provider. What  is flushing? Flushing helps keep the port from getting clogged. Follow instructions from your health care provider about how and when to flush the port. Ports are usually flushed with saline solution or a medicine called heparin. The need for flushing will depend on how the port is used: If the port is only used from time to time to give medicines or draw blood, the port may need to be flushed: Before and after medicines have been given. Before and after blood has been drawn. As part of routine maintenance. Flushing may be recommended every 4-6 weeks. If a constant infusion is running, the port may not need to be flushed. Throw away any syringes in a disposal container that is meant for sharp items (sharps container). You can buy a sharps container from a pharmacy, or you can make one by using an empty hard plastic bottle with a cover. How long will my port stay implanted? The port can stay in for as long as your health care provider thinks it is needed. When it is time for the port to come out, a surgery will be done to remove it. The surgery will be similar to the procedure that was done to putthe port in. Follow these instructions at home:  Flush your port as told by your health care provider. If you need an infusion over several days, follow instructions from your health care provider about how to take   care of your port site. Make sure you: Wash your hands with soap and water before you change your dressing. If soap and water are not available, use alcohol-based hand sanitizer. Change your dressing as told by your health care provider. Place any used dressings or infusion bags into a plastic bag. Throw that bag in the trash. Keep the dressing that covers the needle clean and dry. Do not get it wet. Do not use scissors or sharp objects near the tube. Keep the tube clamped, unless it is being used. Check your port site every day for signs of infection. Check for: Redness, swelling, or  pain. Fluid or blood. Pus or a bad smell. Protect the skin around the port site. Avoid wearing bra straps that rub or irritate the site. Protect the skin around your port from seat belts. Place a soft pad over your chest if needed. Bathe or shower as told by your health care provider. The site may get wet as long as you are not actively receiving an infusion. Return to your normal activities as told by your health care provider. Ask your health care provider what activities are safe for you. Carry a medical alert card or wear a medical alert bracelet at all times. This will let health care providers know that you have an implanted port in case of an emergency. Get help right away if: You have redness, swelling, or pain at the port site. You have fluid or blood coming from your port site. You have pus or a bad smell coming from the port site. You have a fever. Summary Implanted ports are usually placed in the chest for long-term IV access. Follow instructions from your health care provider about flushing the port and changing bandages (dressings). Take care of the area around your port by avoiding clothing that puts pressure on the area, and by watching for signs of infection. Protect the skin around your port from seat belts. Place a soft pad over your chest if needed. Get help right away if you have a fever or you have redness, swelling, pain, drainage, or a bad smell at the port site. This information is not intended to replace advice given to you by your health care provider. Make sure you discuss any questions you have with your healthcare provider. Document Revised: 03/09/2020 Document Reviewed: 03/09/2020 Elsevier Patient Education  2022 Elsevier Inc.  

## 2021-06-24 NOTE — Patient Instructions (Signed)
[facility]  Discharge Instructions: ?Thank you for choosing [facility] to provide your oncology and hematology care.  ? ?If you have a lab appointment with the Cancer Center, please go directly to the Cancer Center and check in at the registration area. ?  ?Wear comfortable clothing and clothing appropriate for easy access to any Portacath or PICC line.  ? ?We strive to give you quality time with your provider. You may need to reschedule your appointment if you arrive late (15 or more minutes).  Arriving late affects you and other patients whose appointments are after yours.  Also, if you miss three or more appointments without notifying the office, you may be dismissed from the clinic at the provider?s discretion.    ?  ?For prescription refill requests, have your pharmacy contact our office and allow 72 hours for refills to be completed.   ? ?Today you received the following chemotherapy and/or immunotherapy agents: Keytruda ?  ?To help prevent nausea and vomiting after your treatment, we encourage you to take your nausea medication as directed. ? ?BELOW ARE SYMPTOMS THAT SHOULD BE REPORTED IMMEDIATELY: ?*FEVER GREATER THAN 100.4 F (38 ?C) OR HIGHER ?*CHILLS OR SWEATING ?*NAUSEA AND VOMITING THAT IS NOT CONTROLLED WITH YOUR NAUSEA MEDICATION ?*UNUSUAL SHORTNESS OF BREATH ?*UNUSUAL BRUISING OR BLEEDING ?*URINARY PROBLEMS (pain or burning when urinating, or frequent urination) ?*BOWEL PROBLEMS (unusual diarrhea, constipation, pain near the anus) ?TENDERNESS IN MOUTH AND THROAT WITH OR WITHOUT PRESENCE OF ULCERS (sore throat, sores in mouth, or a toothache) ?UNUSUAL RASH, SWELLING OR PAIN  ?UNUSUAL VAGINAL DISCHARGE OR ITCHING  ? ?Items with * indicate a potential emergency and should be followed up as soon as possible or go to the Emergency Department if any problems should occur. ? ?Please show the CHEMOTHERAPY ALERT CARD or IMMUNOTHERAPY ALERT CARD at check-in to the  Emergency Department and triage nurse. ? ?Should you have questions after your visit or need to cancel or reschedule your appointment, please contact [facility]  Dept: [phone]  and follow the prompts.  Office hours are 8:00 a.m. to 4:30 p.m. Monday - Friday. Please note that voicemails left after 4:00 p.m. may not be returned until the following business day.  We are closed weekends and major holidays. You have access to a nurse at all times for urgent questions. Please call the main number to the clinic Dept: [phone] and follow the prompts. ? ? ?For any non-urgent questions, you may also contact your provider using MyChart. We now offer e-Visits for anyone 18 and older to request care online for non-urgent symptoms. For details visit [email or web address]. ?  ?Also download the MyChart app! Go to the app store, search "MyChart", open the app, select [facility], and log in with your MyChart username and password. ? ?Due to Covid, a mask is required upon entering the hospital/clinic. If you do not have a mask, one will be given to you upon arrival. For doctor visits, patients may have 1 support person aged 18 or older with them. For treatment visits, patients cannot have anyone with them due to current Covid guidelines and our immunocompromised population.  ? ?

## 2021-06-30 ENCOUNTER — Telehealth: Payer: Self-pay | Admitting: Medical Oncology

## 2021-06-30 ENCOUNTER — Telehealth: Payer: Self-pay | Admitting: Nurse Practitioner

## 2021-06-30 NOTE — Telephone Encounter (Signed)
Pt asking for new work restrictions. Discussed with pt.

## 2021-06-30 NOTE — Telephone Encounter (Signed)
What kind of forms? FMLA forms or a surgical clearance?   Please schedule patient an appointment to discuss with provider. AS, CMA

## 2021-06-30 NOTE — Telephone Encounter (Signed)
Patient would like some paperwork filled out for patient to fill out with work and he needs it done. He would like a call to discuss the procedures and if he needs an appointment. Please advise, thanks.

## 2021-06-30 NOTE — Telephone Encounter (Signed)
Please see below. AS, CMA 

## 2021-07-05 ENCOUNTER — Other Ambulatory Visit: Payer: Self-pay

## 2021-07-05 ENCOUNTER — Encounter: Payer: Self-pay | Admitting: Nurse Practitioner

## 2021-07-05 ENCOUNTER — Ambulatory Visit (INDEPENDENT_AMBULATORY_CARE_PROVIDER_SITE_OTHER): Payer: No Typology Code available for payment source | Admitting: Nurse Practitioner

## 2021-07-05 VITALS — BP 132/76 | HR 72 | Temp 98.0°F | Ht 69.0 in | Wt 227.4 lb

## 2021-07-05 DIAGNOSIS — C3490 Malignant neoplasm of unspecified part of unspecified bronchus or lung: Secondary | ICD-10-CM | POA: Diagnosis not present

## 2021-07-05 DIAGNOSIS — M545 Low back pain, unspecified: Secondary | ICD-10-CM | POA: Diagnosis not present

## 2021-07-05 DIAGNOSIS — E1122 Type 2 diabetes mellitus with diabetic chronic kidney disease: Secondary | ICD-10-CM | POA: Diagnosis not present

## 2021-07-05 DIAGNOSIS — G8929 Other chronic pain: Secondary | ICD-10-CM

## 2021-07-05 DIAGNOSIS — I10 Essential (primary) hypertension: Secondary | ICD-10-CM

## 2021-07-05 DIAGNOSIS — I129 Hypertensive chronic kidney disease with stage 1 through stage 4 chronic kidney disease, or unspecified chronic kidney disease: Secondary | ICD-10-CM

## 2021-07-05 DIAGNOSIS — IMO0001 Reserved for inherently not codable concepts without codable children: Secondary | ICD-10-CM

## 2021-07-05 NOTE — Progress Notes (Signed)
Established Patient Office Visit  Subjective:  Patient ID: Kevin Lambert., male    DOB: [birth year]  Age: 65 y.o. MRN: [id number]  CC:  Chief Complaint  Patient presents with   Follow-up     HPI Kevin Lambert. presents for follow up visit. He is concerned about paperwork recently completed per his oncologist. Per his oncologist, paperwork state that he cannot lift more than 10 pounds. 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 Ob requirements require him to lift no more than 25 pounds. It is rare, if ever, that patient has to lift more than 10 pounds, and does not have to lift 25 pounds. The patient has not problems with his back or spinal column. He denies abdominal hernia or other conditions which worsen when he exerts himself. The patient does have Stage IV lung caner he does go to chemotherapy as scheduled. the patient denies any unusual shortness of breath. He denies chest pain or shortness  The patient has dicussed this with his oncologist.  His oncologist does not feel comfortable changing the disability paperwork. The patient states that he needs the  part time job in order to afford his bills and expenses. He assures me that he can handle the demands of his job. He also understands that if I change the paperwork, that ultimately, it is his responsibility to seek help or ask for assistance if he does need to lift something more than ten pounds. He did voice understanding with this plan.    Past Medical History:  Diagnosis Date   Arthritis    Chronic kidney disease    Depression    DM (diabetes mellitus) ([place])    type II   Dyslipidemia    Dyspnea    unable to walk much - he thinks it is from    GERD (gastroesophageal reflux disease)    Hepatitis    Hepatitis C- treated   History of kidney stones    HTN (hypertension)    Neuropathy    nscl ca dx'd 10/2019   OSA on CPAP    PTSD (post-traumatic stress disorder)     Past Surgical History:   Procedure Laterality Date   BIOPSY OF MEDIASTINAL MASS  12/10/2019   Procedure: BIOPSY OF MEDIASTINAL MASS;  Surgeon: Garner Nash, DO;  Location: [facility];  Service: Pulmonary;;  right upper lobe   BRONCHIAL NEEDLE ASPIRATION BIOPSY  12/10/2019   Procedure: BRONCHIAL NEEDLE ASPIRATION BIOPSIES;  Surgeon: Garner Nash, DO;  Location: [facility];  Service: Pulmonary;;   COLONOSCOPY     IR IMAGING GUIDED PORT INSERTION  12/31/2019   kidney stone     URETERAL STENT PLACEMENT     Ureteral Stent Removed     VIDEO BRONCHOSCOPY WITH ENDOBRONCHIAL ULTRASOUND N/A 12/10/2019   Procedure: VIDEO BRONCHOSCOPY WITH ENDOBRONCHIAL ULTRASOUND;  Surgeon: Garner Nash, DO;  Location: [facility];  Service: Pulmonary;  Laterality: N/A;    Family History  Problem Relation Age of Onset   Liver disease Mother    Diabetes Father     Social History   Socioeconomic History   Marital status: Single    Spouse name: Not on file   Number of children: Not on file   Years of education: Not on file   Highest education level: Not on file  Occupational History   Occupation: Psychologist, counselling    Employer: [id number]  Tobacco Use   Smoking status: Some Days  Packs/day: 0.60    Years: 50.00    Pack years: 30.00    Types: Cigarettes   Smokeless tobacco: Never   Tobacco comments:    03/25/21: 1-2 Black & Milds  Vaping Use   Vaping Use: Never used  Substance and Sexual Activity   Alcohol use: Yes    Alcohol/week: 6.0 standard drinks    Types: 6 Cans of beer per week   Drug use: Not Currently   Sexual activity: Yes    Birth control/protection: None  Other Topics Concern   Not on file  Social History Narrative   Not on file   Social Determinants of Health   Financial Resource Strain: High Risk   Difficulty of Paying Living Expenses: Very hard  Food Insecurity: Food Insecurity Present   Worried About [place] in the Last Year: Sometimes true   Ran Out of Food in the Last Year:  Sometimes true  Transportation Needs: No Transportation Needs   Lack of Transportation (Medical): No   Lack of Transportation (Non-Medical): No  Physical Activity: Not on file  Stress: Stress Concern Present   Feeling of Stress : To some extent  Social Connections: Unknown   Frequency of Communication with Friends and Family: Three times a week   Frequency of Social Gatherings with Friends and Family: Three times a week   Attends Religious Services: Never   Active Member of Clubs or Organizations: No   Attends Archivist Meetings: Never   Marital Status: Not on file  Intimate Partner Violence: Not on file    Outpatient Medications Prior to Visit  Medication Sig Dispense Refill   amLODipine (NORVASC) 10 MG tablet Take 10 mg by mouth daily.     Ascorbic Acid (VITAMIN C) 1000 MG tablet Take 1,000 mg by mouth daily.     aspirin EC 81 MG tablet Take 81 mg by mouth daily.     atorvastatin (LIPITOR) 10 MG tablet Take 10 mg by mouth daily.     carvedilol (COREG) 3.125 MG tablet Take 1 tablet (3.125 mg total) by mouth 2 (two) times daily with a meal. 180 tablet 0   cholecalciferol (VITAMIN D3) 25 MCG (1000 UNIT) tablet Take 1,000 Units by mouth daily.     Cyanocobalamin (VITAMIN B 12 PO) Take 1 tablet by mouth daily.     dapagliflozin propanediol (FARXIGA) 10 MG TABS tablet Take 1 tablet (10 mg total) by mouth daily. 90 tablet 1   furosemide (LASIX) 20 MG tablet Take 1 tablet (20 mg total) by mouth daily. As needed 20 tablet 0   magic mouthwash SOLN Take 5 mLs by mouth 4 (four) times daily as needed for mouth pain. Swish and swallow or spit 240 mL 2   meclizine (ANTIVERT) 12.5 MG tablet Take 1 tablet (12.5 mg total) by mouth 3 (three) times daily as needed for dizziness. 90 tablet 0   Propylene Glycol (SYSTANE BALANCE OP) Place 1 drop into both eyes daily.     sildenafil (VIAGRA) 100 MG tablet TAKE ONE TABLET BY MOUTH AS INSTRUCTED (TAKE 1 HOUR PRIOR TO SEXUAL ACTIVITY *DO NOT EXCEED  1 DOSE PER 24 HOUR PERIOD*)     triamterene-hydrochlorothiazide (MAXZIDE-25) 37.5-25 MG tablet Take 1 tablet by mouth daily.     zinc gluconate 50 MG tablet Take 50 mg by mouth daily.     bisacodyl (DULCOLAX) 5 MG EC tablet Take 5 mg by mouth daily as needed for moderate constipation.     lidocaine-prilocaine (  EMLA) cream Apply to Port-A-Cath site 30-60-minute before treatment. 30 g 0   nicotine (NICODERM CQ) 21 mg/24hr patch Place 1 patch (21 mg total) onto the skin daily. 28 patch 3   nicotine polacrilex (COMMIT) 4 MG lozenge Take 1 lozenge (4 mg total) by mouth as needed for smoking cessation. 108 tablet 3   potassium chloride SA (KLOR-CON) 20 MEQ tablet Take 1 tablet (20 mEq total) by mouth daily. 10 tablet 0   pregabalin (LYRICA) 25 MG capsule Take 1 capsule (25 mg total) by mouth daily. 30 capsule 1   No facility-administered medications prior to visit.    Allergies  Allergen Reactions   Metformin And Related Other (See Comments)    Severe muscle spasms   Naproxen     Pt states it have him off balance.   Oxycontin [Oxycodone] Hives    ROS Review of Systems  Constitutional:  Negative for activity change, chills, fatigue and fever.  HENT:  Negative for congestion, postnasal drip, rhinorrhea, sinus pressure, sinus pain, sneezing and sore throat.   Eyes: Negative.   Respiratory:  Positive for chest tightness and shortness of breath. Negative for cough and wheezing.        Intermittent and stable. Currenlty being treated at cancer center for Stage iV lung cancer.   Cardiovascular:  Positive for leg swelling. Negative for chest pain and palpitations.  Gastrointestinal:  Negative for constipation, diarrhea, nausea and vomiting.  Endocrine: Negative for cold intolerance, heat intolerance, polydipsia and polyuria.  Genitourinary:  Negative for dysuria, frequency and urgency.  Musculoskeletal:  Negative for arthralgias, back pain and myalgias.  Skin:  Negative for rash.   Allergic/Immunologic: Negative for environmental allergies.  Neurological:  Negative for dizziness, weakness and headaches.  Psychiatric/Behavioral:  The patient is not nervous/anxious.      Objective:    Physical Exam Vitals and nursing note reviewed.  Constitutional:      Appearance: Normal appearance. He is well-developed. He is obese.  HENT:     Head: Normocephalic and atraumatic.     Nose: Nose normal.     Mouth/Throat:     Mouth: Mucous membranes are moist.  Eyes:     Extraocular Movements: Extraocular movements intact.     Conjunctiva/sclera: Conjunctivae normal.     Pupils: Pupils are equal, round, and reactive to light.  Cardiovascular:     Rate and Rhythm: Normal rate and regular rhythm.     Pulses: Normal pulses.     Heart sounds: Normal heart sounds.  Pulmonary:     Effort: Pulmonary effort is normal.     Breath sounds: Wheezing and rhonchi present.     Comments: The patient does have intermittent shortness of breath due to Stage IV lung cancer . Abdominal:     Palpations: Abdomen is soft.  Musculoskeletal:        General: Normal range of motion.     Cervical back: Normal range of motion and neck supple.  Lymphadenopathy:     Cervical: No cervical adenopathy.  Skin:    General: Skin is warm and dry.     Capillary Refill: Capillary refill takes less than 2 seconds.  Neurological:     General: No focal deficit present.     Mental Status: He is alert and oriented to person, place, and time.  Psychiatric:        Mood and Affect: Mood normal.        Behavior: Behavior normal.        Thought  Content: Thought content normal.        Judgment: Judgment normal.    Today's Vitals   07/05/21 0931  BP: 132/76  Pulse: 72  Temp: 98 F (36.7 C)  SpO2: 97%  Weight: 227 lb 6.4 oz (103.1 kg)   Body mass index is 33.58 kg/m.   Wt Readings from Last 3 Encounters:  07/05/21 227 lb 6.4 oz (103.1 kg)  06/16/21 226 lb 11.2 oz (102.8 kg)  06/03/21 226 lb 11.2 oz  (102.8 kg)     Health Maintenance Due  Topic Date Due   OPHTHALMOLOGY EXAM  Never done   HIV Screening  Never done   Hepatitis C Screening  Never done   COLONOSCOPY (Pts 45-59yr Insurance coverage will need to be confirmed)  Never done   COVID-19 Vaccine (3 - Moderna risk series) 01/28/2021   PNA vac Low Risk Adult (1 of 2 - PCV13) 05/21/2021   INFLUENZA VACCINE  06/07/2021    There are no preventive care reminders to display for this patient.  Lab Results  Component Value Date   TSH 1.045 06/24/2021   Lab Results  Component Value Date   WBC 6.5 06/24/2021   HGB 13.5 06/24/2021   HCT 39.6 06/24/2021   MCV 94.7 06/24/2021   PLT 195 06/24/2021   Lab Results  Component Value Date   NA 133 (L) 06/24/2021   K 4.4 06/24/2021   CO2 23 06/24/2021   GLUCOSE 98 06/24/2021   BUN 32 (H) 06/24/2021   CREATININE 2.25 (H) 06/24/2021   BILITOT 0.4 06/24/2021   ALKPHOS 61 06/24/2021   AST 16 06/24/2021   ALT 22 06/24/2021   PROT 6.8 06/24/2021   ALBUMIN 3.8 06/24/2021   CALCIUM 10.0 06/24/2021   ANIONGAP 10 06/24/2021   EGFR 32 (L) 01/08/2021   No results found for: CHOL No results found for: HDL No results found for: LDLCALC No results found for: TRIG No results found for: CHOLHDL Lab Results  Component Value Date   HGBA1C 5.9 (A) 04/28/2021      Assessment & Plan:  1. 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  We will continue to monitor and check hemoglobin A1c again in 3 months.  2. Essential hypertension Blood pressure stable.  Continue medications as prescribed.  3. Malignant neoplasm of lung, unspecified laterality, unspecified part of lung ([place] Patient seen urgently at cancer center.  Has chemotherapy treatments once every 3 weeks.  Condition stable.  4. Chronic midline low back pain without sciatica Mild midline low back pain which can be worse with exertion.   Patient is able to lift up to 25 pounds without issues.  Rarely has to do so.  Will submit new FMLA/disability paperwork to his employer for reevaluation.  This should give the patient ability to work his part-time position at [employer] difficulty.  He understands that if any problems or issues occur due to the change in paperwork that the responsibility is ultimately his.  He voices understanding and agreement with this.  Will return completed before intubation soon as ready.  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 Rapid proofreading was performed to expedite the delivery of the information. Despite proofreading, phonetic errors will occur which are common with this voice recognition software. Please take this into consideration. If there are any concerns, please contact our office.     Follow-up: Return for as scheduled.    [name], NP

## 2021-07-08 ENCOUNTER — Ambulatory Visit: Payer: No Typology Code available for payment source

## 2021-07-08 ENCOUNTER — Other Ambulatory Visit: Payer: No Typology Code available for payment source

## 2021-07-08 ENCOUNTER — Ambulatory Visit: Payer: No Typology Code available for payment source | Admitting: Internal Medicine

## 2021-07-09 NOTE — Progress Notes (Signed)
[place] OFFICE PROGRESS NOTE  [name], NP [place] [address]  DIAGNOSIS: Stage IV (T2b, N3, M1a) non-small cell lung cancer, adenocarcinoma presented with large right upper lobe lung mass in addition to mediastinal and right supraclavicular lymphadenopathy as well as bilateral pulmonary nodules diagnosed in February 2021.   MOLECULAR STUDY by [employer]:   KRASG12C, 4.3%, Binimetinib   [id number], 0.9%, Niraparib, Olaparib, Rucaparib,Talazoparib, Tazemetostat   [id number], 1.7%, None   PRIOR THERAPY:  None  CURRENT THERAPY: Systemic chemotherapy with carboplatin for AUC of 5, Alimta 500 mg/M2 and Keytruda 200 mg IV every 3 weeks.  First dose December 31, 2019. Status post 25 cycles. Starting from cycle #5, he will be on maintenance Alimta and Keytruda. Alimta was discontinued due to renal insufficiency.  INTERVAL HISTORY: Kevin Lambert. 65 y.o. male returns to the clinic today for a follow-up visit.  The patient is feeling fairly well today without any concerning complaints except he has had 6-7 headaches in the interval which is unusual for him. He notes some headaches in the front of his forehead and across the top of his head. He states it is worse at night but is usually gone by morning. He also is prescribe meclizine by his PCP for dizziness but feels like he has been more dizzy and off balance. Denies facial dropping, speech changes, extremity weakness, seizures, falls, nausea, or vomiting. He hasn't tried taking anything because he wasn't sure what he was allowed to take.     The patient tolerated his last cycle of treatment well without any concerning adverse side effects.  Alimta was discontinued due to renal insufficiency and he is currently on single agent immunotherapy with Keytruda.  He denies any fever, chills, night sweats, or unexplained weight loss. He reports baseline dyspnea on exertion.  He denies significant cough,  shortness of breath, chest pain, or hemoptysis.  He denies any  diarrhea or constipation.  He denies any rashes or skin changes.  The patient is here today for evaluation before starting cycle #26.     MEDICAL HISTORY: Past Medical History:  Diagnosis Date   Arthritis    Chronic kidney disease    Depression    DM (diabetes mellitus) ([place])    type II   Dyslipidemia    Dyspnea    unable to walk much - he thinks it is from    GERD (gastroesophageal reflux disease)    Hepatitis    Hepatitis C- treated   History of kidney stones    HTN (hypertension)    Neuropathy    nscl ca dx'd 10/2019   OSA on CPAP    PTSD (post-traumatic stress disorder)     ALLERGIES:  is allergic to metformin and related, naproxen, and oxycontin [oxycodone].  MEDICATIONS:  Current Outpatient Medications  Medication Sig Dispense Refill   amLODipine (NORVASC) 10 MG tablet Take 10 mg by mouth daily.     Ascorbic Acid (VITAMIN C) 1000 MG tablet Take 1,000 mg by mouth daily.     aspirin EC 81 MG tablet Take 81 mg by mouth daily.     atorvastatin (LIPITOR) 10 MG tablet Take 10 mg by mouth daily.     carvedilol (COREG) 3.125 MG tablet Take 1 tablet (3.125 mg total) by mouth 2 (two) times daily with a meal. 180 tablet 0   cholecalciferol (VITAMIN D3) 25 MCG (1000 UNIT) tablet Take 1,000 Units by mouth daily.  Cyanocobalamin (VITAMIN B 12 PO) Take 1 tablet by mouth daily.     dapagliflozin propanediol (FARXIGA) 10 MG TABS tablet Take 1 tablet (10 mg total) by mouth daily. 90 tablet 1   furosemide (LASIX) 20 MG tablet Take 1 tablet (20 mg total) by mouth daily. As needed 20 tablet 0   magic mouthwash SOLN Take 5 mLs by mouth 4 (four) times daily as needed for mouth pain. Swish and swallow or spit 240 mL 2   meclizine (ANTIVERT) 12.5 MG tablet Take 1 tablet (12.5 mg total) by mouth 3 (three) times daily as needed for dizziness. 90 tablet 0   Propylene Glycol (SYSTANE BALANCE OP) Place 1 drop into both eyes daily.      sildenafil (VIAGRA) 100 MG tablet TAKE ONE TABLET BY MOUTH AS INSTRUCTED (TAKE 1 HOUR PRIOR TO SEXUAL ACTIVITY *DO NOT EXCEED 1 DOSE PER 24 HOUR PERIOD*)     triamterene-hydrochlorothiazide (MAXZIDE-25) 37.5-25 MG tablet Take 1 tablet by mouth daily.     zinc gluconate 50 MG tablet Take 50 mg by mouth daily.     No current facility-administered medications for this visit.   Facility-Administered Medications Ordered in Other Visits  Medication Dose Route Frequency Provider Last Rate Last Admin   0.9 %  sodium chloride infusion   Intravenous Once Curt Bears, MD       heparin lock flush 100 unit/mL  500 Units Intracatheter Once PRN Curt Bears, MD       pembrolizumab [facility]) 200 mg in sodium chloride 0.9 % 50 mL chemo infusion  200 mg Intravenous Once Curt Bears, MD       sodium chloride flush (NS) 0.9 % injection 10 mL  10 mL Intracatheter PRN Curt Bears, MD        SURGICAL HISTORY:  Past Surgical History:  Procedure Laterality Date   BIOPSY OF MEDIASTINAL MASS  12/10/2019   Procedure: BIOPSY OF MEDIASTINAL MASS;  Surgeon: Garner Nash, DO;  Location: [facility];  Service: Pulmonary;;  right upper lobe   BRONCHIAL NEEDLE ASPIRATION BIOPSY  12/10/2019   Procedure: BRONCHIAL NEEDLE ASPIRATION BIOPSIES;  Surgeon: Garner Nash, DO;  Location: [facility];  Service: Pulmonary;;   COLONOSCOPY     IR IMAGING GUIDED PORT INSERTION  12/31/2019   kidney stone     URETERAL STENT PLACEMENT     Ureteral Stent Removed     VIDEO BRONCHOSCOPY WITH ENDOBRONCHIAL ULTRASOUND N/A 12/10/2019   Procedure: VIDEO BRONCHOSCOPY WITH ENDOBRONCHIAL ULTRASOUND;  Surgeon: Garner Nash, DO;  Location: [facility];  Service: Pulmonary;  Laterality: N/A;    REVIEW OF SYSTEMS:   Constitutional: Positive for fatigue. Negative for appetite change, chills, fever and unexpected weight change. HENT: Negative for mouth sores, sore throat and trouble swallowing.   Eyes: Positive for red  eyes. Negative for eye problems and icterus. Respiratory: Negative for cough, hemoptysis, shortness of breath and wheezing.   Cardiovascular: Negative for chest pain and leg swelling.  Gastrointestinal: Negative for abdominal pain, constipation, diarrhea, nausea and vomiting.  Genitourinary: Negative for bladder incontinence, difficulty urinating, dysuria, frequency and hematuria.   Musculoskeletal: Negative for back pain, gait problem, neck pain and neck stiffness.  Skin: Negative for itching and rash.  Neurological: Positive for balance changes. Positive for headaches (none at this time). Positive for intermittent dizziness. Negative for extremity weakness, gait problem, light-headedness and seizures.  Hematological: Negative for adenopathy. Does not bruise/bleed easily.  Psychiatric/Behavioral: Negative for confusion, depression and sleep disturbance. The patient is  not nervous/anxious.     PHYSICAL EXAMINATION:  Blood pressure 135/69, pulse 71, temperature (!) 96.3 F (35.7 C), temperature source Oral, resp. rate 17, weight 231 lb (104.8 kg), SpO2 96 %.  ECOG PERFORMANCE STATUS: 1   Physical Exam  Constitutional: Oriented to person, place, and time and well-developed, well-nourished, and in no distress. No distress.  HENT:  Head: Normocephalic and atraumatic.  Mouth/Throat: Oropharynx is clear and moist. No oropharyngeal exudate.  Eyes: Conjunctivae are normal. Right eye exhibits no discharge. Left eye exhibits no discharge. No scleral icterus.  Neck: Normal range of motion. Neck supple.  Cardiovascular: Normal rate, regular rhythm, normal heart sounds and intact distal pulses.   Pulmonary/Chest: Effort normal and breath sounds normal. No respiratory distress. No wheezes. No rales.  Abdominal: Soft. Bowel sounds are normal. Exhibits no distension and no mass. There is no tenderness.  Musculoskeletal: Normal range of motion. Exhibits no edema.  Lymphadenopathy:    No cervical  adenopathy.  Neurological: Alert and oriented to person, place, and time. Exhibits normal muscle tone. Gait normal. Coordination normal. Cranial nerves II-XII grossly intact. Strength 5/5 bilaterally and equal and symmetric.  Skin: Skin is warm and dry. No rash noted. Not diaphoretic. No erythema. No pallor.  Psychiatric: Mood, memory and judgment normal.  Vitals reviewed.  LABORATORY DATA: Lab Results  Component Value Date   WBC 6.8 07/15/2021   HGB 12.7 (L) 07/15/2021   HCT 36.6 (L) 07/15/2021   MCV 93.6 07/15/2021   PLT 175 07/15/2021      Chemistry      Component Value Date/Time   NA 135 07/15/2021 1417   NA 138 01/08/2021 0955   K 4.1 07/15/2021 1417   CL 101 07/15/2021 1417   CO2 23 07/15/2021 1417   BUN 43 (H) 07/15/2021 1417   BUN 28 (H) 01/08/2021 0955   CREATININE 2.33 (H) 07/15/2021 1417      Component Value Date/Time   CALCIUM 9.8 07/15/2021 1417   ALKPHOS 62 07/15/2021 1417   AST 18 07/15/2021 1417   ALT 22 07/15/2021 1417   BILITOT 0.3 07/15/2021 1417       RADIOGRAPHIC STUDIES:  No results found.   ASSESSMENT/PLAN:  This is a very pleasant 65 year old African-American male diagnosed with stage IV non-small cell lung cancer, adenocarcinoma.  He presented with a large right upper lobe lung mass with right hilar, subcarinal, paratracheal lymphadenopathy, and right supraclavicular lymphadenopathy.  He also presented with bilateral pulmonary nodules.  He was diagnosed in February 2021.  He does not have any actionable mutations.     The patient is currently undergoing palliative systemic chemotherapy with carboplatin for an AUC of 5, Alimta 500 mg/m, Keytruda 200 mg IV every 3 weeks.  He is status post 25 cycles and he tolerated it well without any adverse side effects. Starting from cycle #5, the patient has been on maintenance alimta and [place].  Alimta was ultimately discontinued due to renal insufficiency.    Labs were reviewed.  His creatinine is 2.33  today. He has baseline CKD.  Recommend that he proceed with cycle #26 today scheduled.  I will arrange for the patient to have a CT scan of the chest, abdomen, and pelvis prior to starting his next cycle of treatment to restage his disease.  Due to his renal insufficiency, we will order this without contrast.  I discussed the patient's headaches and feeling off balance with Dr. Julien Nordmann.  Given that he has not had any neuroimaging since February  2021, I will arrange for the patient to have a repeat brain MRI to rule out any metastatic disease to the brain.  We will see him back for follow-up visit in 3 weeks for evaluation and to review his scan results before starting cycle #27.  The patient was advised to call immediately if he has any concerning symptoms in the interval. The patient voices understanding of current disease status and treatment options and is in agreement with the current care plan. All questions were answered. The patient knows to call the clinic with any problems, questions or concerns. We can certainly see the patient much sooner if necessary        Orders Placed This Encounter  Procedures   CT Abdomen Pelvis Wo Contrast    Standing Status:   Future    Standing Expiration Date:   07/15/2022    Order Specific Question:   Preferred imaging location?    Answer:   [facility]    Order Specific Question:   Is Oral Contrast requested for this exam?    Answer:   Yes, Per Radiology protocol   CT Chest Wo Contrast    Standing Status:   Future    Standing Expiration Date:   07/15/2022    Order Specific Question:   Preferred imaging location?    Answer:   [facility]   MR Brain W Wo Contrast    Standing Status:   Future    Standing Expiration Date:   07/15/2022    Order Specific Question:   If indicated for the ordered procedure, I authorize the administration of contrast media per Radiology protocol    Answer:   Yes    Order Specific Question:   What is the  patient's sedation requirement?    Answer:   No Sedation    Order Specific Question:   Does the patient have a pacemaker or implanted devices?    Answer:   No    Order Specific Question:   Use SRS Protocol?    Answer:   No    Order Specific Question:   Preferred imaging location?    Answer:   [facility] (table limit - 550 lbs)     The total time spent in the appointment was 20-29 minutes.   Anvita Hirata L Trena Dunavan, PA-C 07/15/21

## 2021-07-15 ENCOUNTER — Other Ambulatory Visit: Payer: Self-pay

## 2021-07-15 ENCOUNTER — Inpatient Hospital Stay: Payer: No Typology Code available for payment source

## 2021-07-15 ENCOUNTER — Inpatient Hospital Stay: Payer: No Typology Code available for payment source | Attending: Internal Medicine | Admitting: Physician Assistant

## 2021-07-15 VITALS — BP 135/69 | HR 71 | Temp 96.3°F | Resp 17 | Wt 231.0 lb

## 2021-07-15 DIAGNOSIS — I129 Hypertensive chronic kidney disease with stage 1 through stage 4 chronic kidney disease, or unspecified chronic kidney disease: Secondary | ICD-10-CM | POA: Diagnosis not present

## 2021-07-15 DIAGNOSIS — C3491 Malignant neoplasm of unspecified part of right bronchus or lung: Secondary | ICD-10-CM

## 2021-07-15 DIAGNOSIS — E1122 Type 2 diabetes mellitus with diabetic chronic kidney disease: Secondary | ICD-10-CM | POA: Insufficient documentation

## 2021-07-15 DIAGNOSIS — Z79899 Other long term (current) drug therapy: Secondary | ICD-10-CM | POA: Diagnosis not present

## 2021-07-15 DIAGNOSIS — R519 Headache, unspecified: Secondary | ICD-10-CM | POA: Diagnosis not present

## 2021-07-15 DIAGNOSIS — N189 Chronic kidney disease, unspecified: Secondary | ICD-10-CM | POA: Diagnosis not present

## 2021-07-15 DIAGNOSIS — C3411 Malignant neoplasm of upper lobe, right bronchus or lung: Secondary | ICD-10-CM | POA: Insufficient documentation

## 2021-07-15 DIAGNOSIS — Z5112 Encounter for antineoplastic immunotherapy: Secondary | ICD-10-CM | POA: Diagnosis present

## 2021-07-15 DIAGNOSIS — Z95828 Presence of other vascular implants and grafts: Secondary | ICD-10-CM

## 2021-07-15 LAB — CMP (CANCER CENTER ONLY)
ALT: 22 U/L (ref 0–44)
AST: 18 U/L (ref 15–41)
Albumin: 3.5 g/dL (ref 3.5–5.0)
Alkaline Phosphatase: 62 U/L (ref 38–126)
Anion gap: 11 (ref 5–15)
BUN: 43 mg/dL — ABNORMAL HIGH (ref 8–23)
CO2: 23 mmol/L (ref 22–32)
Calcium: 9.8 mg/dL (ref 8.9–10.3)
Chloride: 101 mmol/L (ref 98–111)
Creatinine: 2.33 mg/dL — ABNORMAL HIGH (ref 0.61–1.24)
GFR, Estimated: 30 mL/min — ABNORMAL LOW (ref >60)
Glucose, Bld: 96 mg/dL (ref 70–99)
Potassium: 4.1 mmol/L (ref 3.5–5.1)
Sodium: 135 mmol/L (ref 135–145)
Total Bilirubin: 0.3 mg/dL (ref 0.3–1.2)
Total Protein: 6.8 g/dL (ref 6.5–8.1)

## 2021-07-15 LAB — CBC WITH DIFFERENTIAL (CANCER CENTER ONLY)
Abs Immature Granulocytes: 0.03 10*3/uL (ref 0.00–0.07)
Basophils Absolute: 0 10*3/uL (ref 0.0–0.1)
Basophils Relative: 1 %
Eosinophils Absolute: 0.5 10*3/uL (ref 0.0–0.5)
Eosinophils Relative: 8 %
HCT: 36.6 % — ABNORMAL LOW (ref 39.0–52.0)
Hemoglobin: 12.7 g/dL — ABNORMAL LOW (ref 13.0–17.0)
Immature Granulocytes: 0 %
Lymphocytes Relative: 38 %
Lymphs Abs: 2.5 10*3/uL (ref 0.7–4.0)
MCH: 32.5 pg (ref 26.0–34.0)
MCHC: 34.7 g/dL (ref 30.0–36.0)
MCV: 93.6 fL (ref 80.0–100.0)
Monocytes Absolute: 0.5 10*3/uL (ref 0.1–1.0)
Monocytes Relative: 8 %
Neutro Abs: 3.1 10*3/uL (ref 1.7–7.7)
Neutrophils Relative %: 45 %
Platelet Count: 175 10*3/uL (ref 150–400)
RBC: 3.91 MIL/uL — ABNORMAL LOW (ref 4.22–5.81)
RDW: 14.4 % (ref 11.5–15.5)
WBC Count: 6.8 10*3/uL (ref 4.0–10.5)
nRBC: 0 % (ref 0.0–0.2)

## 2021-07-15 LAB — TSH: TSH: 0.563 u[IU]/mL (ref 0.320–4.118)

## 2021-07-15 MED ORDER — SODIUM CHLORIDE 0.9% FLUSH
10.0000 mL | Freq: Once | INTRAVENOUS | Status: AC
  Administered 2021-07-15: 10 mL

## 2021-07-15 MED ORDER — HEPARIN SOD (PORK) LOCK FLUSH 100 UNIT/ML IV SOLN
500.0000 [IU] | Freq: Once | INTRAVENOUS | Status: AC | PRN
  Administered 2021-07-15: 500 [IU]

## 2021-07-15 MED ORDER — SODIUM CHLORIDE 0.9 % IV SOLN: Freq: Once | INTRAVENOUS | Status: AC

## 2021-07-15 MED ORDER — SODIUM CHLORIDE 0.9 % IV SOLN
200.0000 mg | Freq: Once | INTRAVENOUS | Status: AC
  Administered 2021-07-15: 200 mg via INTRAVENOUS
  Filled 2021-07-15: qty 8

## 2021-07-15 MED ORDER — SODIUM CHLORIDE 0.9% FLUSH
10.0000 mL | INTRAVENOUS | Status: DC | PRN
Start: 2021-07-15 — End: 2021-07-15
  Administered 2021-07-15: 10 mL

## 2021-07-15 NOTE — Progress Notes (Signed)
Creatinine 2.33, okay to proceed per [employer],  PA-C

## 2021-07-15 NOTE — Patient Instructions (Signed)
[facility]  Discharge Instructions: ?Thank you for choosing [facility] to provide your oncology and hematology care.  ? ?If you have a lab appointment with the Cancer Center, please go directly to the Cancer Center and check in at the registration area. ?  ?Wear comfortable clothing and clothing appropriate for easy access to any Portacath or PICC line.  ? ?We strive to give you quality time with your provider. You may need to reschedule your appointment if you arrive late (15 or more minutes).  Arriving late affects you and other patients whose appointments are after yours.  Also, if you miss three or more appointments without notifying the office, you may be dismissed from the clinic at the provider?s discretion.    ?  ?For prescription refill requests, have your pharmacy contact our office and allow 72 hours for refills to be completed.   ? ?Today you received the following chemotherapy and/or immunotherapy agents: Keytruda ?  ?To help prevent nausea and vomiting after your treatment, we encourage you to take your nausea medication as directed. ? ?BELOW ARE SYMPTOMS THAT SHOULD BE REPORTED IMMEDIATELY: ?*FEVER GREATER THAN 100.4 F (38 ?C) OR HIGHER ?*CHILLS OR SWEATING ?*NAUSEA AND VOMITING THAT IS NOT CONTROLLED WITH YOUR NAUSEA MEDICATION ?*UNUSUAL SHORTNESS OF BREATH ?*UNUSUAL BRUISING OR BLEEDING ?*URINARY PROBLEMS (pain or burning when urinating, or frequent urination) ?*BOWEL PROBLEMS (unusual diarrhea, constipation, pain near the anus) ?TENDERNESS IN MOUTH AND THROAT WITH OR WITHOUT PRESENCE OF ULCERS (sore throat, sores in mouth, or a toothache) ?UNUSUAL RASH, SWELLING OR PAIN  ?UNUSUAL VAGINAL DISCHARGE OR ITCHING  ? ?Items with * indicate a potential emergency and should be followed up as soon as possible or go to the Emergency Department if any problems should occur. ? ?Please show the CHEMOTHERAPY ALERT CARD or IMMUNOTHERAPY ALERT CARD at check-in to the  Emergency Department and triage nurse. ? ?Should you have questions after your visit or need to cancel or reschedule your appointment, please contact [facility]  Dept: [phone]  and follow the prompts.  Office hours are 8:00 a.m. to 4:30 p.m. Monday - Friday. Please note that voicemails left after 4:00 p.m. may not be returned until the following business day.  We are closed weekends and major holidays. You have access to a nurse at all times for urgent questions. Please call the main number to the clinic Dept: [phone] and follow the prompts. ? ? ?For any non-urgent questions, you may also contact your provider using MyChart. We now offer e-Visits for anyone 18 and older to request care online for non-urgent symptoms. For details visit [email or web address]. ?  ?Also download the MyChart app! Go to the app store, search "MyChart", open the app, select [facility], and log in with your MyChart username and password. ? ?Due to Covid, a mask is required upon entering the hospital/clinic. If you do not have a mask, one will be given to you upon arrival. For doctor visits, patients may have 1 support person aged 18 or older with them. For treatment visits, patients cannot have anyone with them due to current Covid guidelines and our immunocompromised population.  ? ?

## 2021-07-20 ENCOUNTER — Telehealth: Payer: Self-pay | Admitting: Nurse Practitioner

## 2021-07-20 NOTE — Telephone Encounter (Signed)
Please reach out to patient. He has questions regarding paperwork filled out by [employer]. AS, CMA

## 2021-07-21 NOTE — Telephone Encounter (Signed)
Called pt he would like his paper work to be faxed

## 2021-07-26 ENCOUNTER — Telehealth: Payer: Self-pay

## 2021-07-26 NOTE — Telephone Encounter (Signed)
Pt has been provided the contact number to Radiology Scheduling.

## 2021-07-29 ENCOUNTER — Telehealth: Payer: Self-pay

## 2021-07-29 NOTE — Telephone Encounter (Signed)
Pt called requesting to move his tx on 08/05/21 to the afternoon because he has been given an eviction notice and needs to go to court that morning. I have left the pt some housing resource information at the front desk. He states he will come to pick it up today.  I have called the pt back and left a detailed message to advise to be here at 12:45pm for his lab, OV and tx.

## 2021-07-30 ENCOUNTER — Encounter: Payer: Self-pay | Admitting: Internal Medicine

## 2021-07-30 NOTE — Progress Notes (Signed)
[facility] OFFICE PROGRESS NOTE  Kevin [place], NP [place] [address]  DIAGNOSIS: Stage IV (T2b, N3, M1a) non-small cell lung cancer, adenocarcinoma presented with large right upper lobe lung mass in addition to mediastinal and right supraclavicular lymphadenopathy as well as bilateral pulmonary nodules diagnosed in February 2021.   MOLECULAR STUDY by [employer]:   KRASG12C, 4.3%, Binimetinib   [id number], 0.9%, Niraparib, Olaparib, Rucaparib,Talazoparib, Tazemetostat   [id number], 1.7%, None   PRIOR THERAPY: None  CURRENT THERAPY:  Systemic chemotherapy with carboplatin for AUC of 5, Alimta 500 mg/M2 and Keytruda 200 mg IV every 3 weeks.  First dose December 31, 2019. Status post 26 cycles. Starting from cycle #5, he will be on maintenance Alimta and Keytruda. Alimta was discontinued due to renal insufficiency.  INTERVAL HISTORY: Kevin Lambert. 65 y.o. male returns to the clinic today for a follow-up visit.  The patient is feeling fairly well today without any concerning complaints except a flare up of his right hip bursitis, which he states he has had before.  At his last appointment, the patient was endorsing increased frequency of headaches.  His headaches have resolved at this time.  Otherwise, he denies any adverse side effects from his last cycle of treatment.  Alimta was discontinued due to renal insufficiency.  He is currently on single agent immunotherapy with Keytruda.  He denies any fever, chills, night sweats, or unexplained weight loss.  He reports baseline dyspnea on exertion.  He denies any significant cough, chest pain, or hemoptysis.  He denies any nausea, vomiting, diarrhea, or constipation.  He denies any rashes or skin changes.  The patient recently had a restaging CT scan of the chest, abdomen, and pelvis performed recently.  He also had a brain MRI to evaluate his headaches.  The patient is here today for evaluation to  review his scan results before starting cycle #27.  MEDICAL HISTORY: Past Medical History:  Diagnosis Date   Arthritis    Chronic kidney disease    Depression    DM (diabetes mellitus) ([place])    type II   Dyslipidemia    Dyspnea    unable to walk much - he thinks it is from    GERD (gastroesophageal reflux disease)    Hepatitis    Hepatitis C- treated   History of kidney stones    HTN (hypertension)    Neuropathy    nscl ca dx'd 10/2019   OSA on CPAP    PTSD (post-traumatic stress disorder)     ALLERGIES:  is allergic to metformin and related, naproxen, and oxycontin [oxycodone].  MEDICATIONS:  Current Outpatient Medications  Medication Sig Dispense Refill   amLODipine (NORVASC) 10 MG tablet Take 10 mg by mouth daily.     Ascorbic Acid (VITAMIN C) 1000 MG tablet Take 1,000 mg by mouth daily.     aspirin EC 81 MG tablet Take 81 mg by mouth daily.     atorvastatin (LIPITOR) 10 MG tablet Take 10 mg by mouth daily.     carvedilol (COREG) 3.125 MG tablet Take 1 tablet (3.125 mg total) by mouth 2 (two) times daily with a meal. 180 tablet 0   cholecalciferol (VITAMIN D3) 25 MCG (1000 UNIT) tablet Take 1,000 Units by mouth daily.     Cyanocobalamin (VITAMIN B 12 PO) Take 1 tablet by mouth daily.     dapagliflozin propanediol (FARXIGA) 10 MG TABS tablet Take 1 tablet (10 mg total) by  mouth daily. 90 tablet 1   furosemide (LASIX) 20 MG tablet Take 1 tablet (20 mg total) by mouth daily. As needed 20 tablet 0   magic mouthwash SOLN Take 5 mLs by mouth 4 (four) times daily as needed for mouth pain. Swish and swallow or spit 240 mL 2   meclizine (ANTIVERT) 12.5 MG tablet Take 1 tablet (12.5 mg total) by mouth 3 (three) times daily as needed for dizziness. 90 tablet 0   Propylene Glycol (SYSTANE BALANCE OP) Place 1 drop into both eyes daily.     sildenafil (VIAGRA) 100 MG tablet TAKE ONE TABLET BY MOUTH AS INSTRUCTED (TAKE 1 HOUR PRIOR TO SEXUAL ACTIVITY *DO NOT EXCEED 1 DOSE PER 24 HOUR  PERIOD*)     triamterene-hydrochlorothiazide (MAXZIDE-25) 37.5-25 MG tablet Take 1 tablet by mouth daily.     zinc gluconate 50 MG tablet Take 50 mg by mouth daily.     No current facility-administered medications for this visit.   Facility-Administered Medications Ordered in Other Visits  Medication Dose Route Frequency Provider Last Rate Last Admin   heparin lock flush 100 unit/mL  500 Units Intracatheter Once PRN Curt Bears, MD       pembrolizumab [facility]) 200 mg in sodium chloride 0.9 % 50 mL chemo infusion  200 mg Intravenous Once Curt Bears, MD       sodium chloride flush (NS) 0.9 % injection 10 mL  10 mL Intracatheter PRN Curt Bears, MD        SURGICAL HISTORY:  Past Surgical History:  Procedure Laterality Date   BIOPSY OF MEDIASTINAL MASS  12/10/2019   Procedure: BIOPSY OF MEDIASTINAL MASS;  Surgeon: Garner Nash, DO;  Location: [facility];  Service: Pulmonary;;  right upper lobe   BRONCHIAL NEEDLE ASPIRATION BIOPSY  12/10/2019   Procedure: BRONCHIAL NEEDLE ASPIRATION BIOPSIES;  Surgeon: Garner Nash, DO;  Location: [facility];  Service: Pulmonary;;   COLONOSCOPY     IR IMAGING GUIDED PORT INSERTION  12/31/2019   kidney stone     URETERAL STENT PLACEMENT     Ureteral Stent Removed     VIDEO BRONCHOSCOPY WITH ENDOBRONCHIAL ULTRASOUND N/A 12/10/2019   Procedure: VIDEO BRONCHOSCOPY WITH ENDOBRONCHIAL ULTRASOUND;  Surgeon: Garner Nash, DO;  Location: [facility];  Service: Pulmonary;  Laterality: N/A;    REVIEW OF SYSTEMS:   Constitutional: Positive for fatigue. Negative for appetite change, chills, fever and unexpected weight change. HENT: Negative for mouth sores, sore throat and trouble swallowing.   Eyes: Positive for red eyes. Negative for eye problems and icterus. Respiratory: Positive for baseline shortness of breath with exertion. Negative for cough, hemoptysis, and wheezing.   Cardiovascular: Negative for chest pain and leg swelling.   Gastrointestinal: Negative for abdominal pain, constipation, diarrhea, nausea and vomiting.  Genitourinary: Negative for bladder incontinence, difficulty urinating, dysuria, frequency and hematuria.   Musculoskeletal: Positive for right hip pain. Negative for back pain, gait problem, neck pain and neck stiffness.  Skin: Negative for itching and rash.  Neurological: Negative for extremity weakness, gait problem, light-headedness and seizures.  Hematological: Negative for adenopathy. Does not bruise/bleed easily.  Psychiatric/Behavioral: Negative for confusion, depression and sleep disturbance. The patient is not nervous/anxious.     PHYSICAL EXAMINATION:  Blood pressure (!) 142/73, pulse 73, temperature (!) 97 F (36.1 C), temperature source Tympanic, resp. rate 20, height 5\' 9"  (1.753 m), weight 228 lb 4.8 oz (103.6 kg), SpO2 100 %.  ECOG PERFORMANCE STATUS: 1  Physical Exam  Constitutional: Oriented  to person, place, and time and well-developed, well-nourished, and in no distress.  HENT:  Head: Normocephalic and atraumatic.  Mouth/Throat: Oropharynx is clear and moist. No oropharyngeal exudate.  Eyes: Conjunctivae are normal. Right eye exhibits no discharge. Left eye exhibits no discharge. No scleral icterus.  Neck: Normal range of motion. Neck supple.  Cardiovascular: Normal rate, regular rhythm, normal heart sounds and intact distal pulses.   Pulmonary/Chest: Effort normal. Quiet breath sounds in all lung fields. No respiratory distress. No wheezes. No rales.  Abdominal: Soft. Bowel sounds are normal. Exhibits no distension and no mass. There is no tenderness.  Musculoskeletal: Normal range of motion. Exhibits no edema.  Lymphadenopathy:    No cervical adenopathy.  Neurological: Alert and oriented to person, place, and time. Exhibits normal muscle tone. Gait normal. Coordination normal.  Skin: Skin is warm and dry. No rash noted. Not diaphoretic. No erythema. No pallor.   Psychiatric: Mood, memory and judgment normal.  Vitals reviewed.  LABORATORY DATA: Lab Results  Component Value Date   WBC 7.0 08/05/2021   HGB 13.3 08/05/2021   HCT 38.1 (L) 08/05/2021   MCV 93.2 08/05/2021   PLT 212 08/05/2021      Chemistry      Component Value Date/Time   NA 139 08/05/2021 1334   NA 138 01/08/2021 0955   K 4.1 08/05/2021 1334   CL 103 08/05/2021 1334   CO2 23 08/05/2021 1334   BUN 34 (H) 08/05/2021 1334   BUN 28 (H) 01/08/2021 0955   CREATININE 2.36 (H) 08/05/2021 1334      Component Value Date/Time   CALCIUM 10.3 08/05/2021 1334   ALKPHOS 78 08/05/2021 1334   AST 14 (L) 08/05/2021 1334   ALT 16 08/05/2021 1334   BILITOT 0.4 08/05/2021 1334       RADIOGRAPHIC STUDIES:  CT Abdomen Pelvis Wo Contrast  Result Date: 08/04/2021 CLINICAL DATA:  Metastatic non-small lung cancer, assess treatment response, right upper lobe mass EXAM: CT CHEST, ABDOMEN AND PELVIS WITHOUT CONTRAST TECHNIQUE: Multidetector CT imaging of the chest, abdomen and pelvis was performed following the standard protocol without IV contrast. Oral enteric contrast was administered COMPARISON:  05/25/2021 FINDINGS: CT CHEST FINDINGS Cardiovascular: Right chest port catheter. Aortic atherosclerosis. Normal heart size. Three-vessel coronary artery calcifications. No pericardial effusion. Mediastinum/Nodes: No enlarged mediastinal, hilar, or axillary lymph nodes. Thyroid gland, trachea, and esophagus demonstrate no significant findings. Lungs/Pleura: Diffuse bilateral bronchial wall thickening. Spiculated mass of the posterior right upper lobe abutting the major fissure is diminished in size, measuring 2.1 x 1.5 cm, previously 2.5 x 2.3 cm (series 4, image 52). Background of very fine centrilobular pulmonary nodules, most concentrated in the lung apices, most commonly seen in smoking-related respiratory bronchiolitis. No pleural effusion or pneumothorax. Musculoskeletal: No chest wall mass or  suspicious bone lesions identified. CT ABDOMEN PELVIS FINDINGS Hepatobiliary: No solid liver abnormality is seen. No gallstones, gallbladder wall thickening, or biliary dilatation. Pancreas: Unremarkable. No pancreatic ductal dilatation or surrounding inflammatory changes. Spleen: Normal in size without significant abnormality. Adrenals/Urinary Tract: Stable, benign fatty attenuation adrenal adenomata (series 2, image 54). Small bilateral nonobstructive renal calculi. No ureteral calculi or hydronephrosis. Thickening of the decompressed urinary bladder wall, likely due to chronic outlet obstruction Stomach/Bowel: Stomach is within normal limits. Appendix appears normal. No evidence of bowel wall thickening, distention, or inflammatory changes. Vascular/Lymphatic: Aortic atherosclerosis. No enlarged abdominal or pelvic lymph nodes. Reproductive: Prostatomegaly. Other: No abdominal wall hernia or abnormality. No abdominopelvic ascites. Musculoskeletal: No acute or significant  osseous findings. IMPRESSION: 1. Spiculated mass of the posterior right upper lobe abutting the major fissure is diminished in size, consistent with treatment response. 2. No noncontrast evidence of recurrent or metastatic disease in the chest. 3. Background of very fine centrilobular pulmonary nodules, most concentrated in the lung apices, most commonly seen in smoking-related respiratory bronchiolitis. No suspicious pulmonary nodules at this time. 4. Stable, benign fatty attenuation adrenal adenomata. 5. Nonobstructive bilateral nephrolithiasis. 6. Prostatomegaly with thickening of the decompressed urinary bladder wall, likely due to chronic outlet obstruction. 7. Coronary artery disease. Aortic Atherosclerosis (ICD10-I70.0). Electronically Signed   By: Delanna Ahmadi M.D.   On: 08/04/2021 17:03   CT Chest Wo Contrast  Result Date: 08/04/2021 CLINICAL DATA:  Metastatic non-small lung cancer, assess treatment response, right upper lobe mass  EXAM: CT CHEST, ABDOMEN AND PELVIS WITHOUT CONTRAST TECHNIQUE: Multidetector CT imaging of the chest, abdomen and pelvis was performed following the standard protocol without IV contrast. Oral enteric contrast was administered COMPARISON:  05/25/2021 FINDINGS: CT CHEST FINDINGS Cardiovascular: Right chest port catheter. Aortic atherosclerosis. Normal heart size. Three-vessel coronary artery calcifications. No pericardial effusion. Mediastinum/Nodes: No enlarged mediastinal, hilar, or axillary lymph nodes. Thyroid gland, trachea, and esophagus demonstrate no significant findings. Lungs/Pleura: Diffuse bilateral bronchial wall thickening. Spiculated mass of the posterior right upper lobe abutting the major fissure is diminished in size, measuring 2.1 x 1.5 cm, previously 2.5 x 2.3 cm (series 4, image 52). Background of very fine centrilobular pulmonary nodules, most concentrated in the lung apices, most commonly seen in smoking-related respiratory bronchiolitis. No pleural effusion or pneumothorax. Musculoskeletal: No chest wall mass or suspicious bone lesions identified. CT ABDOMEN PELVIS FINDINGS Hepatobiliary: No solid liver abnormality is seen. No gallstones, gallbladder wall thickening, or biliary dilatation. Pancreas: Unremarkable. No pancreatic ductal dilatation or surrounding inflammatory changes. Spleen: Normal in size without significant abnormality. Adrenals/Urinary Tract: Stable, benign fatty attenuation adrenal adenomata (series 2, image 54). Small bilateral nonobstructive renal calculi. No ureteral calculi or hydronephrosis. Thickening of the decompressed urinary bladder wall, likely due to chronic outlet obstruction Stomach/Bowel: Stomach is within normal limits. Appendix appears normal. No evidence of bowel wall thickening, distention, or inflammatory changes. Vascular/Lymphatic: Aortic atherosclerosis. No enlarged abdominal or pelvic lymph nodes. Reproductive: Prostatomegaly. Other: No abdominal wall  hernia or abnormality. No abdominopelvic ascites. Musculoskeletal: No acute or significant osseous findings. IMPRESSION: 1. Spiculated mass of the posterior right upper lobe abutting the major fissure is diminished in size, consistent with treatment response. 2. No noncontrast evidence of recurrent or metastatic disease in the chest. 3. Background of very fine centrilobular pulmonary nodules, most concentrated in the lung apices, most commonly seen in smoking-related respiratory bronchiolitis. No suspicious pulmonary nodules at this time. 4. Stable, benign fatty attenuation adrenal adenomata. 5. Nonobstructive bilateral nephrolithiasis. 6. Prostatomegaly with thickening of the decompressed urinary bladder wall, likely due to chronic outlet obstruction. 7. Coronary artery disease. Aortic Atherosclerosis (ICD10-I70.0). Electronically Signed   By: Delanna Ahmadi M.D.   On: 08/04/2021 17:03   MR Brain W Wo Contrast  Result Date: 08/05/2021 CLINICAL DATA:  Metastatic disease evaluation. Patient is in stage IV lung cancer with new headaches and dizziness that occurs. EXAM: MRI HEAD WITHOUT AND WITH CONTRAST TECHNIQUE: Multiplanar, multiecho pulse sequences of the brain and surrounding structures were obtained without and with intravenous contrast. CONTRAST:  58mL GADAVIST GADOBUTROL 1 MMOL/ML IV SOLN COMPARISON:  MR Head 12/11/2019. FINDINGS: Brain: There is no evidence of acute intracranial hemorrhage, extra-axial fluid collection, or acute infarct. There is  mild global parenchymal volume loss with prominence of the ventricular system and extra-axial CSF spaces. There are scattered small foci of FLAIR signal abnormality in the subcortical and periventricular white matter, nonspecific but likely reflecting sequela of chronic white matter microangiopathy. There is no suspicious parenchymal signal abnormality. There is no mass lesion. There is no abnormal enhancement. Vascular: Normal flow voids. Skull and upper cervical  spine: There is no suspicious marrow signal abnormality. There is multilevel degenerative change of the cervical spine with mild-to-moderate stenosis throughout the imaged levels. Sinuses/Orbits: There is a prominent mucous retention cyst in the left maxillary sinus and mild mucosal thickening throughout the remainder of the sinuses, not significantly changed. The globes and orbits are unremarkable. Other: There are bilateral mastoid effusions, increased since the prior study. IMPRESSION: 1. No evidence of intracranial metastatic disease. 2. Bilateral mastoid effusions, increased since the prior study. Electronically Signed   By: Valetta Mole M.D.   On: 08/05/2021 12:07     ASSESSMENT/PLAN:  This is a very pleasant 65 year old African-American male diagnosed with stage IV non-small cell lung cancer, adenocarcinoma.  He presented with a large right upper lobe lung mass with right hilar, subcarinal, paratracheal lymphadenopathy, and right supraclavicular lymphadenopathy.  He also presented with bilateral pulmonary nodules.  He was diagnosed in February 2021.  He does not have any actionable mutations.     The patient is currently undergoing palliative systemic chemotherapy with carboplatin for an AUC of 5, Alimta 500 mg/m, Keytruda 200 mg IV every 3 weeks.  He is status post 26 cycles and he tolerated it well without any adverse side effects. Starting from cycle #5, the patient has been on maintenance alimta and [place].  Alimta was ultimately discontinued due to renal insufficiency.   The patient recently had a restaging CT scan of the chest, abdomen, and pelvis performed today.  He also had a repeat brain MRI to rule out metastatic disease to the brain which was negative. I discussed the results with the patient in the infusion room.  The patient was seen with Dr. Julien Nordmann.  Dr. Julien Nordmann personally and independently reviewed the scan discussed results with the patient today.  The scan showed no evidence for  disease progression.    Labs were reviewed.  His creatinine is 2.36 today. He has baseline CKD. He sees his PCP in a few weeks and advised him to discuss this with her. I also encouraged good blood pressure control and control of his diabetes to protect his kidneys. Recommend that he proceed with cycle #27  today scheduled.  We will see him back for follow-up visit in 3 weeks for evaluation before starting cycle #28.  The patient was advised to call immediately if he has any concerning symptoms in the interval. The patient voices understanding of current disease status and treatment options and is in agreement with the current care plan. All questions were answered. The patient knows to call the clinic with any problems, questions or concerns. We can certainly see the patient much sooner if necessary No orders of the defined types were placed in this encounter.    Eleonore Shippee L Ilene Witcher, PA-C 08/05/21  ADDENDUM: Hematology/Oncology Attending: I had a face-to-face encounter with the patient today.  I reviewed his record, lab, scan and recommended his care plan.  This is a very pleasant 65 years old African-American male diagnosed with stage IV non-small cell lung cancer, in February 2021.  The patient started palliative systemic chemotherapy with carboplatin, Alimta and Keytruda and  currently on maintenance treatment with single agent Keytruda after we discontinued Alimta because of the renal insufficiency. He status post 26 cycles of treatment and has been tolerating his treatment well. He had repeat CT scan of the chest, abdomen pelvis performed recently.  I personally and independently reviewed the scans and discussed the results with the patient. His scan showed improvement in the primary lesion with no evidence for progression. MRI of the brain showed no concerning findings for disease progression in the brain. I recommended for the patient to continue his current treatment with  maintenance Keytruda and he will proceed with cycle #27 today. Will come back for follow-up visit in 3 weeks for evaluation before the next cycle of his treatment. For the renal insufficiency he is followed by nephrology. The patient was advised to call immediately if he has any other concerning symptoms in the interval. The total time spent in the appointment was 30 minutes.  Disclaimer: This note was dictated with voice recognition software. Similar sounding words can inadvertently be transcribed and may be missed upon review. Eilleen Kempf, MD 08/05/21

## 2021-08-01 IMAGING — CT CT ABD-PELV W/ CM
2 of 5 series · 13 of 36 positions shown, 16 images · IV contrast (omnipaque)
Comparison: PET 12/09/2019.

CLINICAL DATA: Non-small-cell lung cancer, staging. Diagnosed [DATE]
with chemotherapy ongoing. Immunotherapy on going. Short of breath
on exertion. Constipation.

EXAM:
CT CHEST, ABDOMEN, AND PELVIS WITH CONTRAST
TECHNIQUE: Multidetector CT imaging of the chest, abdomen and pelvis was
performed following the standard protocol during bolus
administration of intravenous contrast.
CONTRAST:  100mL OMNIPAQUE IOHEXOL 300 MG/ML  SOLN

[Series 2: cap with · axial · 0.89mm/px · z∈[+1061,+1561]mm · 10 of 124 slices shown, 13 images]
[im 12/124  mediastinal]
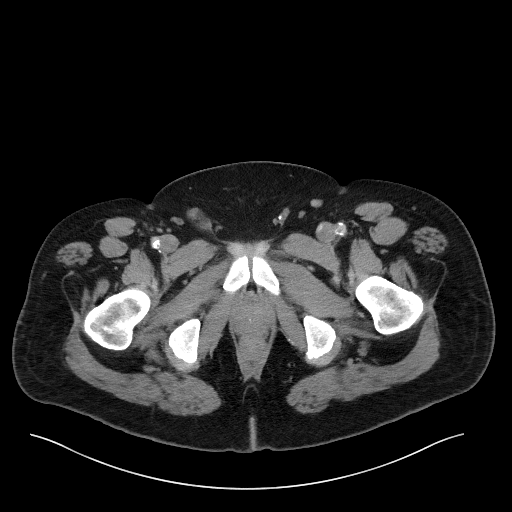
[im 12/124  lung]
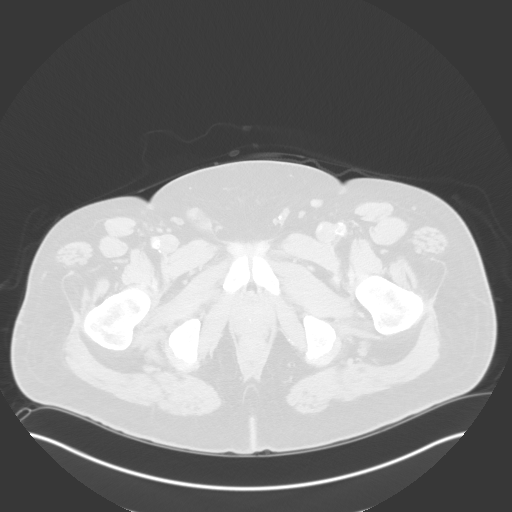
[im 23/124  lung]
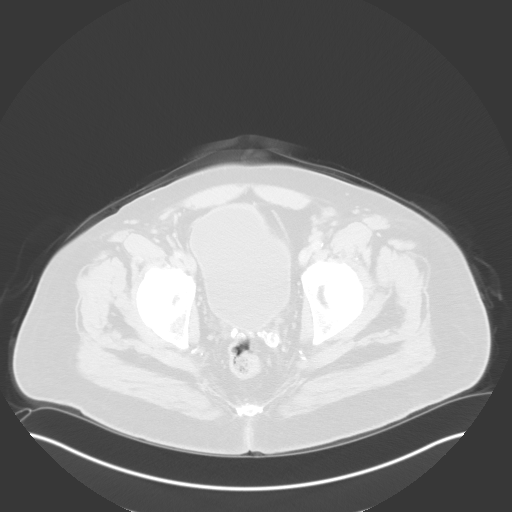
[im 34/124  lung]
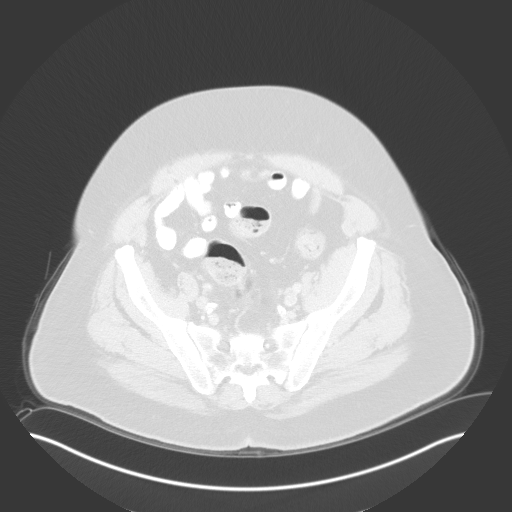
[im 45/124  lung]
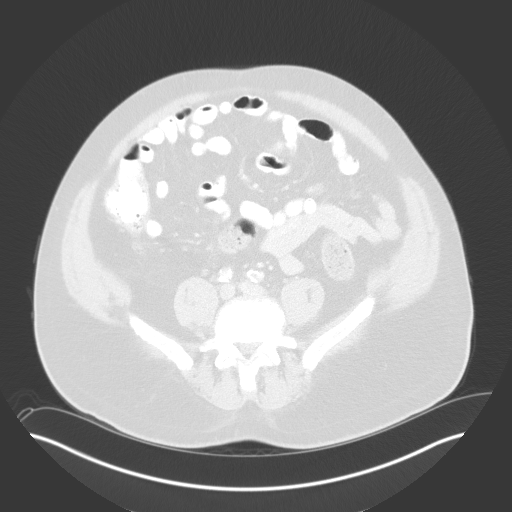
[im 56/124  mediastinal]
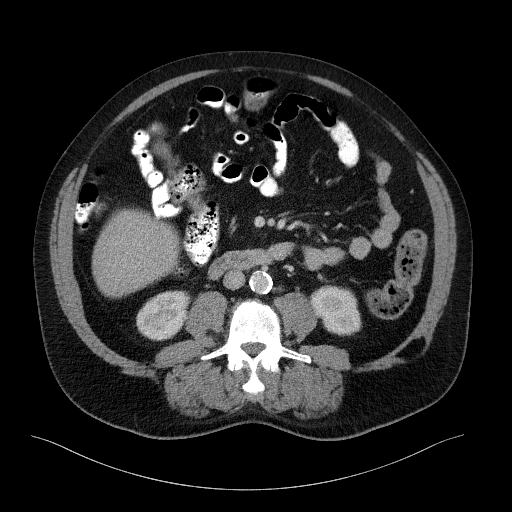
[im 56/124  lung]
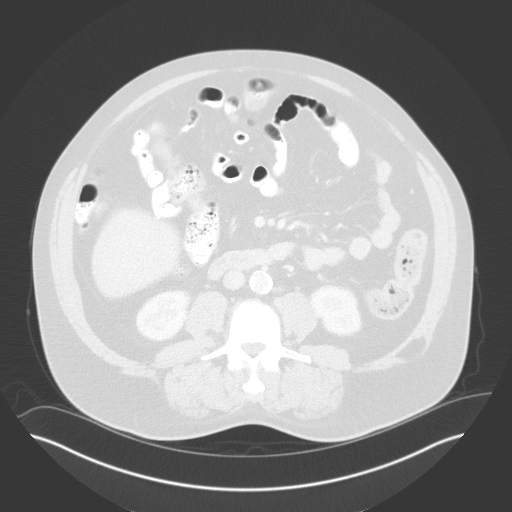
[im 68/124  lung]
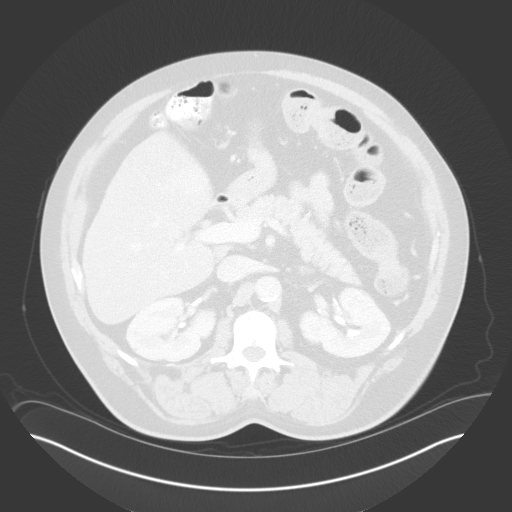
[im 79/124  lung]
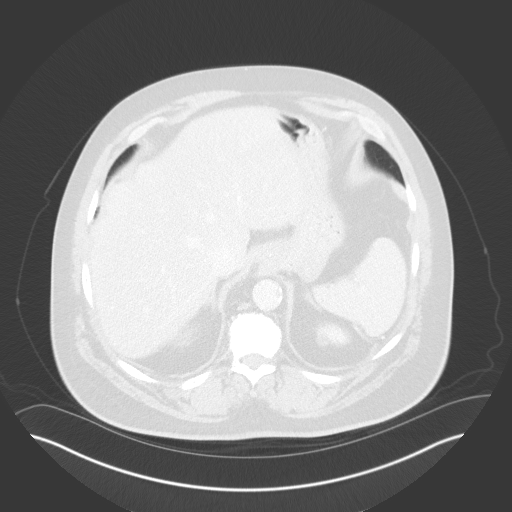
[im 90/124  lung]
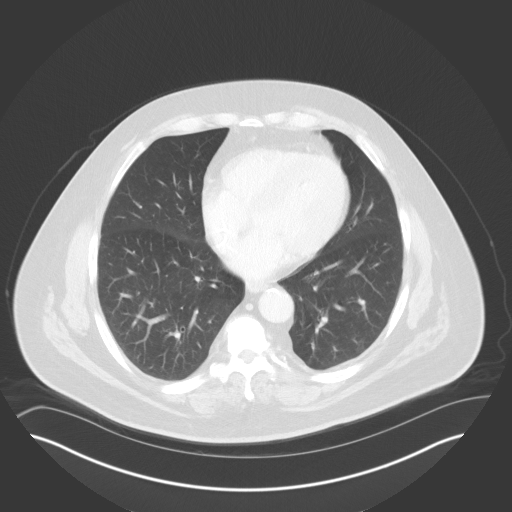
[im 101/124  mediastinal]
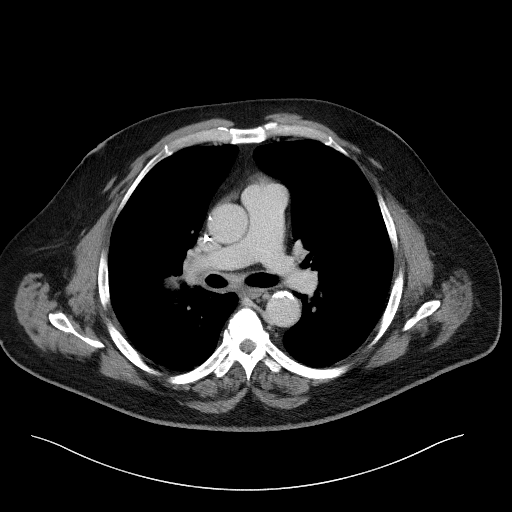
[im 101/124  lung]
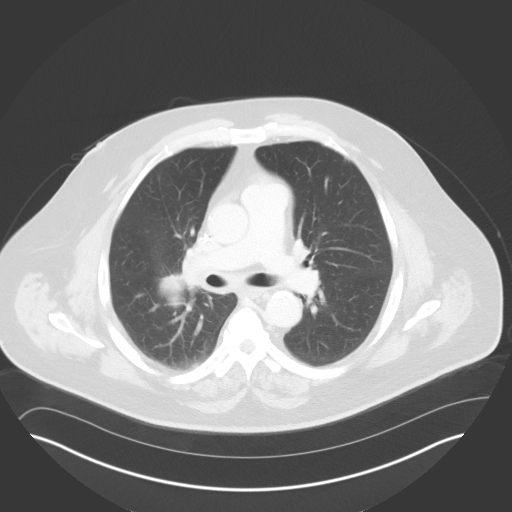
[im 112/124  lung]
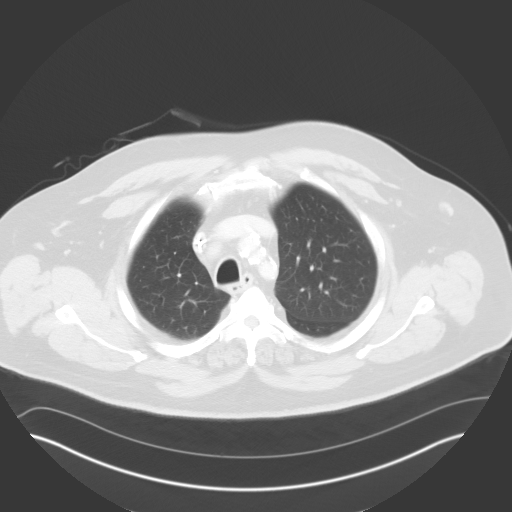

[Series 5: coronals · coronal · 0.88mm/px · 3 of 172 slices shown]
[im 35/172  lung]
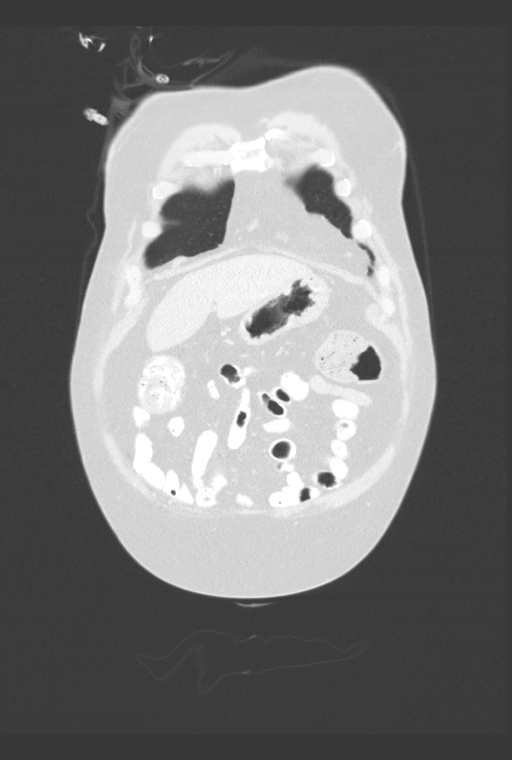
[im 69/172  lung]
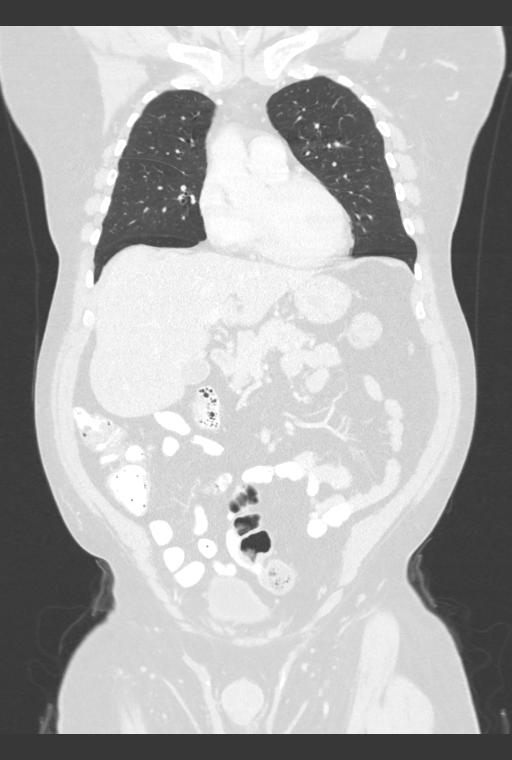
[im 103/172  lung]
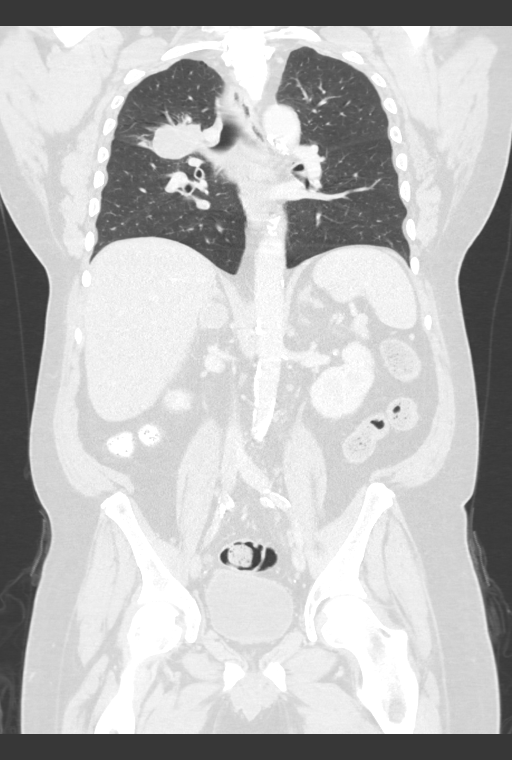

[13 of 36 positions shown; findings below may reference images not displayed]

FINDINGS: CT CHEST FINDINGS

Cardiovascular: Right Port-A-Cath tip at mid right atrium. Aortic
and branch vessel atherosclerosis. Normal heart size, without
pericardial effusion. Multivessel coronary artery atherosclerosis.
No central pulmonary embolism, on this non-dedicated study.

Mediastinum/Nodes: Resolved borderline right supraclavicular
adenopathy.

Right paratracheal node measures 1.3 cm on [DATE] versus 2.0 cm on the
prior exam.

1.5 cm subcarinal node on [DATE] measured 2.3 cm on the prior exam
(when remeasured).

No new thoracic adenopathy.

Lungs/Pleura: No pleural fluid.  Mild centrilobular emphysema.

Posterior right upper lobe lung mass measures 4.0 x 3.6 cm on 49/7.
Somewhat difficult to compare to the prior PET, secondary to
surrounding postobstructive pneumonitis on that exam. Felt to
previously measure on the order of 5.7 x 4.8 cm (when remeasured).

Resolution of previously described pulmonary nodules.

Musculoskeletal: No acute osseous abnormality.

CT ABDOMEN PELVIS FINDINGS

Hepatobiliary: Possible mild hepatic steatosis. No focal liver
lesion. Normal gallbladder, without biliary ductal dilatation.

Pancreas: Normal, without mass or ductal dilatation.

Spleen: Normal in size, without focal abnormality.

Adrenals/Urinary Tract: Mild left adrenal nodularity is unchanged. A
right adrenal nodule of 2.3 cm is similar in size on the prior PET,
and low-density, most consistent with an adenoma.

Upper pole right renal 1.6 cm cyst. Normal left kidney, without
hydronephrosis. Normal urinary bladder.

Stomach/Bowel: Normal stomach, without wall thickening. Normal
colon, appendix, and terminal ileum. Normal small bowel.

Vascular/Lymphatic: Advanced aortic and branch vessel
atherosclerosis. No abdominopelvic adenopathy.

Reproductive: Normal prostate.

Other: Small fat containing right inguinal hernia.

Musculoskeletal: No acute osseous abnormality.
IMPRESSION: 1. Response to therapy of right upper lobe lung mass and thoracic
adenopathy since 12/09/2019.
[DATE]. Resolution of pulmonary nodules, consistent with response to
therapy of pulmonary metastasis.
3. No new or progressive disease.
4. Right adrenal adenoma.
5. Aortic atherosclerosis ([id number]), coronary artery
atherosclerosis and emphysema ([id number]).

## 2021-08-03 ENCOUNTER — Ambulatory Visit (HOSPITAL_COMMUNITY)
Admission: RE | Admit: 2021-08-03 | Discharge: 2021-08-03 | Disposition: A | Discharge status: Home / Self Care | Payer: No Typology Code available for payment source | Source: Ambulatory Visit | Attending: Physician Assistant | Admitting: Physician Assistant

## 2021-08-03 ENCOUNTER — Other Ambulatory Visit: Payer: Self-pay

## 2021-08-03 DIAGNOSIS — C3491 Malignant neoplasm of unspecified part of right bronchus or lung: Secondary | ICD-10-CM | POA: Insufficient documentation

## 2021-08-03 MED ORDER — GADOBUTROL 1 MMOL/ML IV SOLN
10.0000 mL | Freq: Once | INTRAVENOUS | Status: AC | PRN
  Administered 2021-08-03: 10 mL via INTRAVENOUS

## 2021-08-05 ENCOUNTER — Inpatient Hospital Stay: Payer: No Typology Code available for payment source

## 2021-08-05 ENCOUNTER — Inpatient Hospital Stay (HOSPITAL_BASED_OUTPATIENT_CLINIC_OR_DEPARTMENT_OTHER): Payer: No Typology Code available for payment source | Admitting: Physician Assistant

## 2021-08-05 ENCOUNTER — Encounter: Payer: Self-pay | Admitting: Physician Assistant

## 2021-08-05 ENCOUNTER — Other Ambulatory Visit: Payer: Self-pay

## 2021-08-05 ENCOUNTER — Inpatient Hospital Stay: Payer: No Typology Code available for payment source | Admitting: Internal Medicine

## 2021-08-05 VITALS — BP 142/73 | HR 73 | Temp 97.0°F | Resp 20 | Ht 69.0 in | Wt 228.3 lb

## 2021-08-05 DIAGNOSIS — Z95828 Presence of other vascular implants and grafts: Secondary | ICD-10-CM

## 2021-08-05 DIAGNOSIS — C3491 Malignant neoplasm of unspecified part of right bronchus or lung: Secondary | ICD-10-CM

## 2021-08-05 DIAGNOSIS — Z5112 Encounter for antineoplastic immunotherapy: Secondary | ICD-10-CM | POA: Diagnosis not present

## 2021-08-05 LAB — CMP (CANCER CENTER ONLY)
ALT: 16 U/L (ref 0–44)
AST: 14 U/L — ABNORMAL LOW (ref 15–41)
Albumin: 3.7 g/dL (ref 3.5–5.0)
Alkaline Phosphatase: 78 U/L (ref 38–126)
Anion gap: 13 (ref 5–15)
BUN: 34 mg/dL — ABNORMAL HIGH (ref 8–23)
CO2: 23 mmol/L (ref 22–32)
Calcium: 10.3 mg/dL (ref 8.9–10.3)
Chloride: 103 mmol/L (ref 98–111)
Creatinine: 2.36 mg/dL — ABNORMAL HIGH (ref 0.61–1.24)
GFR, Estimated: 30 mL/min — ABNORMAL LOW (ref >60)
Glucose, Bld: 131 mg/dL — ABNORMAL HIGH (ref 70–99)
Potassium: 4.1 mmol/L (ref 3.5–5.1)
Sodium: 139 mmol/L (ref 135–145)
Total Bilirubin: 0.4 mg/dL (ref 0.3–1.2)
Total Protein: 7 g/dL (ref 6.5–8.1)

## 2021-08-05 LAB — CBC WITH DIFFERENTIAL (CANCER CENTER ONLY)
Abs Immature Granulocytes: 0.04 10*3/uL (ref 0.00–0.07)
Basophils Absolute: 0 10*3/uL (ref 0.0–0.1)
Basophils Relative: 1 %
Eosinophils Absolute: 0.5 10*3/uL (ref 0.0–0.5)
Eosinophils Relative: 7 %
HCT: 38.1 % — ABNORMAL LOW (ref 39.0–52.0)
Hemoglobin: 13.3 g/dL (ref 13.0–17.0)
Immature Granulocytes: 1 %
Lymphocytes Relative: 30 %
Lymphs Abs: 2.1 10*3/uL (ref 0.7–4.0)
MCH: 32.5 pg (ref 26.0–34.0)
MCHC: 34.9 g/dL (ref 30.0–36.0)
MCV: 93.2 fL (ref 80.0–100.0)
Monocytes Absolute: 0.6 10*3/uL (ref 0.1–1.0)
Monocytes Relative: 9 %
Neutro Abs: 3.7 10*3/uL (ref 1.7–7.7)
Neutrophils Relative %: 52 %
Platelet Count: 212 10*3/uL (ref 150–400)
RBC: 4.09 MIL/uL — ABNORMAL LOW (ref 4.22–5.81)
RDW: 14.8 % (ref 11.5–15.5)
WBC Count: 7 10*3/uL (ref 4.0–10.5)
nRBC: 0 % (ref 0.0–0.2)

## 2021-08-05 LAB — TSH: TSH: 0.676 u[IU]/mL (ref 0.320–4.118)

## 2021-08-05 MED ORDER — SODIUM CHLORIDE 0.9 % IV SOLN: Freq: Once | INTRAVENOUS | Status: AC

## 2021-08-05 MED ORDER — HEPARIN SOD (PORK) LOCK FLUSH 100 UNIT/ML IV SOLN
500.0000 [IU] | Freq: Once | INTRAVENOUS | Status: AC | PRN
  Administered 2021-08-05: 500 [IU]

## 2021-08-05 MED ORDER — SODIUM CHLORIDE 0.9% FLUSH
10.0000 mL | INTRAVENOUS | Status: DC | PRN
  Administered 2021-08-05: 10 mL

## 2021-08-05 MED ORDER — SODIUM CHLORIDE 0.9 % IV SOLN
200.0000 mg | Freq: Once | INTRAVENOUS | Status: AC
  Administered 2021-08-05: 200 mg via INTRAVENOUS
  Filled 2021-08-05: qty 8

## 2021-08-05 MED ORDER — SODIUM CHLORIDE 0.9% FLUSH
10.0000 mL | Freq: Once | INTRAVENOUS | Status: AC
  Administered 2021-08-05: 10 mL

## 2021-08-05 NOTE — Progress Notes (Signed)
Okay to treat with elevated creat per Cassie PA

## 2021-08-06 ENCOUNTER — Other Ambulatory Visit: Payer: Self-pay | Admitting: Nurse Practitioner

## 2021-08-06 DIAGNOSIS — R42 Dizziness and giddiness: Secondary | ICD-10-CM

## 2021-08-06 MED ORDER — MECLIZINE HCL 12.5 MG PO TABS
12.5000 mg | ORAL_TABLET | Freq: Three times a day (TID) | ORAL | 0 refills | Status: DC | PRN
Start: 2021-08-06 — End: 2021-09-16

## 2021-08-10 ENCOUNTER — Telehealth: Payer: Self-pay | Admitting: Nurse Practitioner

## 2021-08-10 NOTE — Telephone Encounter (Signed)
Patient would like Nira Conn to call him on Thursday or Friday this week, 10/6 or 10/7. Patient would not disclose why he wanted Heather to call him.

## 2021-08-12 NOTE — Telephone Encounter (Signed)
I completed paperwork for him today. Is that what phone call was concerning?

## 2021-08-16 ENCOUNTER — Telehealth: Payer: Self-pay | Admitting: Nurse Practitioner

## 2021-08-16 NOTE — Telephone Encounter (Signed)
I did fill them out and put them in my done box on Thursday 08/12/2021.

## 2021-08-16 NOTE — Progress Notes (Signed)
[facility] OFFICE PROGRESS NOTE  Kevin Freshwater, NP [name] [address]  DIAGNOSIS: Stage IV (T2b, N3, M1a) non-small cell lung cancer, adenocarcinoma presented with large right upper lobe lung mass in addition to mediastinal and right supraclavicular lymphadenopathy as well as bilateral pulmonary nodules diagnosed in February 2021.   MOLECULAR STUDY by [employer]:   KRASG12C, 4.3%, Binimetinib   [id number], 0.9%, Niraparib, Olaparib, Rucaparib,Talazoparib, Tazemetostat   [id number], 1.7%, None   PRIOR THERAPY: None  CURRENT THERAPY: Systemic chemotherapy with carboplatin for AUC of 5, Alimta 500 mg/M2 and Keytruda 200 mg IV every 3 weeks.  First dose December 31, 2019. Status post 27 cycles. Starting from cycle #5, he will be on maintenance Alimta and Keytruda. Alimta was discontinued due to renal insufficiency.  INTERVAL HISTORY: Kevin Lambert. 65 y.o. male returns to the clinic today for a follow-up visit.  The patient is feeling fairly well today without any concerning complaints. He denies any adverse side effects from his last cycle of treatment.  Alimta was discontinued due to renal insufficiency.  He is currently on single agent immunotherapy with Keytruda.  He denies any fever, chills, night sweats, or unexplained weight loss. He actually gained weight and denies any unusual swelling. He reports baseline dyspnea on exertion.  He denies any significant cough, chest pain, or hemoptysis.  He denies any nausea, vomiting, diarrhea, or constipation.  He denies any rashes or skin changes. The patient is here today for evaluation before starting cycle #28.  MEDICAL HISTORY: Past Medical History:  Diagnosis Date   Arthritis    Chronic kidney disease    Depression    DM (diabetes mellitus) ([place])    type II   Dyslipidemia    Dyspnea    unable to walk much - he thinks it is from    GERD (gastroesophageal reflux disease)    Hepatitis     Hepatitis C- treated   History of kidney stones    HTN (hypertension)    Neuropathy    nscl ca dx'd 10/2019   OSA on CPAP    PTSD (post-traumatic stress disorder)     ALLERGIES:  is allergic to metformin and related, naproxen, and oxycontin [oxycodone].  MEDICATIONS:  Current Outpatient Medications  Medication Sig Dispense Refill   amLODipine (NORVASC) 10 MG tablet Take 10 mg by mouth daily.     Ascorbic Acid (VITAMIN C) 1000 MG tablet Take 1,000 mg by mouth daily.     aspirin EC 81 MG tablet Take 81 mg by mouth daily.     atorvastatin (LIPITOR) 10 MG tablet Take 10 mg by mouth daily.     carvedilol (COREG) 3.125 MG tablet Take 1 tablet (3.125 mg total) by mouth 2 (two) times daily with a meal. 180 tablet 0   cholecalciferol (VITAMIN D3) 25 MCG (1000 UNIT) tablet Take 1,000 Units by mouth daily.     Cyanocobalamin (VITAMIN B 12 PO) Take 1 tablet by mouth daily.     dapagliflozin propanediol (FARXIGA) 10 MG TABS tablet Take 1 tablet (10 mg total) by mouth daily. 90 tablet 1   furosemide (LASIX) 20 MG tablet Take 1 tablet (20 mg total) by mouth daily. As needed 20 tablet 0   magic mouthwash SOLN Take 5 mLs by mouth 4 (four) times daily as needed for mouth pain. Swish and swallow or spit 240 mL 2   meclizine (ANTIVERT) 12.5 MG tablet Take 1 tablet (12.5 mg total)  by mouth 3 (three) times daily as needed for dizziness. 90 tablet 0   Propylene Glycol (SYSTANE BALANCE OP) Place 1 drop into both eyes daily.     sildenafil (VIAGRA) 100 MG tablet TAKE ONE TABLET BY MOUTH AS INSTRUCTED (TAKE 1 HOUR PRIOR TO SEXUAL ACTIVITY *DO NOT EXCEED 1 DOSE PER 24 HOUR PERIOD*)     triamterene-hydrochlorothiazide (MAXZIDE-25) 37.5-25 MG tablet Take 1 tablet by mouth daily.     zinc gluconate 50 MG tablet Take 50 mg by mouth daily.     No current facility-administered medications for this visit.    SURGICAL HISTORY:  Past Surgical History:  Procedure Laterality Date   BIOPSY OF MEDIASTINAL MASS  12/10/2019    Procedure: BIOPSY OF MEDIASTINAL MASS;  Surgeon: Garner Nash, DO;  Location: [facility];  Service: Pulmonary;;  right upper lobe   BRONCHIAL NEEDLE ASPIRATION BIOPSY  12/10/2019   Procedure: BRONCHIAL NEEDLE ASPIRATION BIOPSIES;  Surgeon: Garner Nash, DO;  Location: [facility];  Service: Pulmonary;;   COLONOSCOPY     IR IMAGING GUIDED PORT INSERTION  12/31/2019   kidney stone     URETERAL STENT PLACEMENT     Ureteral Stent Removed     VIDEO BRONCHOSCOPY WITH ENDOBRONCHIAL ULTRASOUND N/A 12/10/2019   Procedure: VIDEO BRONCHOSCOPY WITH ENDOBRONCHIAL ULTRASOUND;  Surgeon: Garner Nash, DO;  Location: [facility];  Service: Pulmonary;  Laterality: N/A;    REVIEW OF SYSTEMS:   Constitutional: Positive for fatigue. Negative for appetite change, chills, fever and unexpected weight change. HENT: Negative for mouth sores, sore throat and trouble swallowing.   Eyes: Positive for red eyes/watery. Negative for eye problems and icterus. Respiratory: Positive for baseline shortness of breath with exertion. Negative for cough, hemoptysis, and wheezing.   Cardiovascular: Negative for chest pain and leg swelling.  Gastrointestinal: Negative for abdominal pain, constipation, diarrhea, nausea and vomiting.  Genitourinary: Negative for bladder incontinence, difficulty urinating, dysuria, frequency and hematuria.   Musculoskeletal: Negative for back pain, gait problem, neck pain and neck stiffness.  Skin: Negative for itching and rash.  Neurological: Negative for extremity weakness, gait problem, light-headedness and seizures.  Hematological: Negative for adenopathy. Does not bruise/bleed easily.  Psychiatric/Behavioral: Negative for confusion, depression and sleep disturbance. The patient is not nervous/anxious.     PHYSICAL EXAMINATION:  Blood pressure (!) 157/85, pulse 77, temperature 97.6 F (36.4 C), temperature source Tympanic, resp. rate 19, height 5\' 9"  (1.753 m), weight 236 lb 11.2  oz (107.4 kg), SpO2 100 %.  ECOG PERFORMANCE STATUS: 1  Physical Exam  Constitutional: Oriented to person, place, and time and well-developed, well-nourished, and in no distress.  HENT:  Head: Normocephalic and atraumatic.  Mouth/Throat: Oropharynx is clear and moist. No oropharyngeal exudate.  Eyes: Conjunctivae are normal. Right eye exhibits no discharge. Left eye exhibits no discharge. No scleral icterus.  Neck: Normal range of motion. Neck supple.  Cardiovascular: Normal rate, regular rhythm, normal heart sounds and intact distal pulses.   Pulmonary/Chest: Effort normal. Quiet breath sounds in all lung fields. No respiratory distress. No wheezes. No rales.  Abdominal: Soft. Bowel sounds are normal. Exhibits no distension and no mass. There is no tenderness.  Musculoskeletal: Normal range of motion. Exhibits no edema.  Lymphadenopathy:    No cervical adenopathy.  Neurological: Alert and oriented to person, place, and time. Exhibits normal muscle tone. Gait normal. Coordination normal.  Skin: Skin is warm and dry. No rash noted. Not diaphoretic. No erythema. No pallor.  Psychiatric: Mood, memory and  judgment normal.  Vitals reviewed.  LABORATORY DATA: Lab Results  Component Value Date   WBC 6.7 08/26/2021   HGB 12.6 (L) 08/26/2021   HCT 35.5 (L) 08/26/2021   MCV 92.7 08/26/2021   PLT 190 08/26/2021      Chemistry      Component Value Date/Time   NA 138 08/26/2021 1050   NA 138 01/08/2021 0955   K 4.1 08/26/2021 1050   CL 106 08/26/2021 1050   CO2 22 08/26/2021 1050   BUN 24 (H) 08/26/2021 1050   BUN 28 (H) 01/08/2021 0955   CREATININE 1.88 (H) 08/26/2021 1050      Component Value Date/Time   CALCIUM 9.6 08/26/2021 1050   ALKPHOS 71 08/26/2021 1050   AST 12 (L) 08/26/2021 1050   ALT 17 08/26/2021 1050   BILITOT 0.2 (L) 08/26/2021 1050       RADIOGRAPHIC STUDIES:  CT Abdomen Pelvis Wo Contrast  Result Date: 08/04/2021 CLINICAL DATA:  Metastatic non-small lung  cancer, assess treatment response, right upper lobe mass EXAM: CT CHEST, ABDOMEN AND PELVIS WITHOUT CONTRAST TECHNIQUE: Multidetector CT imaging of the chest, abdomen and pelvis was performed following the standard protocol without IV contrast. Oral enteric contrast was administered COMPARISON:  05/25/2021 FINDINGS: CT CHEST FINDINGS Cardiovascular: Right chest port catheter. Aortic atherosclerosis. Normal heart size. Three-vessel coronary artery calcifications. No pericardial effusion. Mediastinum/Nodes: No enlarged mediastinal, hilar, or axillary lymph nodes. Thyroid gland, trachea, and esophagus demonstrate no significant findings. Lungs/Pleura: Diffuse bilateral bronchial wall thickening. Spiculated mass of the posterior right upper lobe abutting the major fissure is diminished in size, measuring 2.1 x 1.5 cm, previously 2.5 x 2.3 cm (series 4, image 52). Background of very fine centrilobular pulmonary nodules, most concentrated in the lung apices, most commonly seen in smoking-related respiratory bronchiolitis. No pleural effusion or pneumothorax. Musculoskeletal: No chest wall mass or suspicious bone lesions identified. CT ABDOMEN PELVIS FINDINGS Hepatobiliary: No solid liver abnormality is seen. No gallstones, gallbladder wall thickening, or biliary dilatation. Pancreas: Unremarkable. No pancreatic ductal dilatation or surrounding inflammatory changes. Spleen: Normal in size without significant abnormality. Adrenals/Urinary Tract: Stable, benign fatty attenuation adrenal adenomata (series 2, image 54). Small bilateral nonobstructive renal calculi. No ureteral calculi or hydronephrosis. Thickening of the decompressed urinary bladder wall, likely due to chronic outlet obstruction Stomach/Bowel: Stomach is within normal limits. Appendix appears normal. No evidence of bowel wall thickening, distention, or inflammatory changes. Vascular/Lymphatic: Aortic atherosclerosis. No enlarged abdominal or pelvic lymph  nodes. Reproductive: Prostatomegaly. Other: No abdominal wall hernia or abnormality. No abdominopelvic ascites. Musculoskeletal: No acute or significant osseous findings. IMPRESSION: 1. Spiculated mass of the posterior right upper lobe abutting the major fissure is diminished in size, consistent with treatment response. 2. No noncontrast evidence of recurrent or metastatic disease in the chest. 3. Background of very fine centrilobular pulmonary nodules, most concentrated in the lung apices, most commonly seen in smoking-related respiratory bronchiolitis. No suspicious pulmonary nodules at this time. 4. Stable, benign fatty attenuation adrenal adenomata. 5. Nonobstructive bilateral nephrolithiasis. 6. Prostatomegaly with thickening of the decompressed urinary bladder wall, likely due to chronic outlet obstruction. 7. Coronary artery disease. Aortic Atherosclerosis (ICD10-I70.0). Electronically Signed   By: Delanna Ahmadi M.D.   On: 08/04/2021 17:03   CT Chest Wo Contrast  Result Date: 08/04/2021 CLINICAL DATA:  Metastatic non-small lung cancer, assess treatment response, right upper lobe mass EXAM: CT CHEST, ABDOMEN AND PELVIS WITHOUT CONTRAST TECHNIQUE: Multidetector CT imaging of the chest, abdomen and pelvis was performed following the  standard protocol without IV contrast. Oral enteric contrast was administered COMPARISON:  05/25/2021 FINDINGS: CT CHEST FINDINGS Cardiovascular: Right chest port catheter. Aortic atherosclerosis. Normal heart size. Three-vessel coronary artery calcifications. No pericardial effusion. Mediastinum/Nodes: No enlarged mediastinal, hilar, or axillary lymph nodes. Thyroid gland, trachea, and esophagus demonstrate no significant findings. Lungs/Pleura: Diffuse bilateral bronchial wall thickening. Spiculated mass of the posterior right upper lobe abutting the major fissure is diminished in size, measuring 2.1 x 1.5 cm, previously 2.5 x 2.3 cm (series 4, image 52). Background of very  fine centrilobular pulmonary nodules, most concentrated in the lung apices, most commonly seen in smoking-related respiratory bronchiolitis. No pleural effusion or pneumothorax. Musculoskeletal: No chest wall mass or suspicious bone lesions identified. CT ABDOMEN PELVIS FINDINGS Hepatobiliary: No solid liver abnormality is seen. No gallstones, gallbladder wall thickening, or biliary dilatation. Pancreas: Unremarkable. No pancreatic ductal dilatation or surrounding inflammatory changes. Spleen: Normal in size without significant abnormality. Adrenals/Urinary Tract: Stable, benign fatty attenuation adrenal adenomata (series 2, image 54). Small bilateral nonobstructive renal calculi. No ureteral calculi or hydronephrosis. Thickening of the decompressed urinary bladder wall, likely due to chronic outlet obstruction Stomach/Bowel: Stomach is within normal limits. Appendix appears normal. No evidence of bowel wall thickening, distention, or inflammatory changes. Vascular/Lymphatic: Aortic atherosclerosis. No enlarged abdominal or pelvic lymph nodes. Reproductive: Prostatomegaly. Other: No abdominal wall hernia or abnormality. No abdominopelvic ascites. Musculoskeletal: No acute or significant osseous findings. IMPRESSION: 1. Spiculated mass of the posterior right upper lobe abutting the major fissure is diminished in size, consistent with treatment response. 2. No noncontrast evidence of recurrent or metastatic disease in the chest. 3. Background of very fine centrilobular pulmonary nodules, most concentrated in the lung apices, most commonly seen in smoking-related respiratory bronchiolitis. No suspicious pulmonary nodules at this time. 4. Stable, benign fatty attenuation adrenal adenomata. 5. Nonobstructive bilateral nephrolithiasis. 6. Prostatomegaly with thickening of the decompressed urinary bladder wall, likely due to chronic outlet obstruction. 7. Coronary artery disease. Aortic Atherosclerosis (ICD10-I70.0).  Electronically Signed   By: Delanna Ahmadi M.D.   On: 08/04/2021 17:03   MR Brain W Wo Contrast  Result Date: 08/05/2021 CLINICAL DATA:  Metastatic disease evaluation. Patient is in stage IV lung cancer with new headaches and dizziness that occurs. EXAM: MRI HEAD WITHOUT AND WITH CONTRAST TECHNIQUE: Multiplanar, multiecho pulse sequences of the brain and surrounding structures were obtained without and with intravenous contrast. CONTRAST:  67mL GADAVIST GADOBUTROL 1 MMOL/ML IV SOLN COMPARISON:  MR Head 12/11/2019. FINDINGS: Brain: There is no evidence of acute intracranial hemorrhage, extra-axial fluid collection, or acute infarct. There is mild global parenchymal volume loss with prominence of the ventricular system and extra-axial CSF spaces. There are scattered small foci of FLAIR signal abnormality in the subcortical and periventricular white matter, nonspecific but likely reflecting sequela of chronic white matter microangiopathy. There is no suspicious parenchymal signal abnormality. There is no mass lesion. There is no abnormal enhancement. Vascular: Normal flow voids. Skull and upper cervical spine: There is no suspicious marrow signal abnormality. There is multilevel degenerative change of the cervical spine with mild-to-moderate stenosis throughout the imaged levels. Sinuses/Orbits: There is a prominent mucous retention cyst in the left maxillary sinus and mild mucosal thickening throughout the remainder of the sinuses, not significantly changed. The globes and orbits are unremarkable. Other: There are bilateral mastoid effusions, increased since the prior study. IMPRESSION: 1. No evidence of intracranial metastatic disease. 2. Bilateral mastoid effusions, increased since the prior study. Electronically Signed   By: Valetta Mole  M.D.   On: 08/05/2021 12:07     ASSESSMENT/PLAN:  This is a very pleasant 65 year old African-American male diagnosed with stage IV non-small cell lung cancer,  adenocarcinoma.  He presented with a large right upper lobe lung mass with right hilar, subcarinal, paratracheal lymphadenopathy, and right supraclavicular lymphadenopathy.  He also presented with bilateral pulmonary nodules.  He was diagnosed in February 2021.  He does not have any actionable mutations.     The patient is currently undergoing palliative systemic chemotherapy with carboplatin for an AUC of 5, Alimta 500 mg/m, Keytruda 200 mg IV every 3 weeks.  He is status post 27 cycles and he tolerated it well without any adverse side effects. Starting from cycle #5, the patient has been on maintenance alimta and [place].  Alimta was ultimately discontinued due to renal insufficiency.    Labs were reviewed. His creatinine is 1.88. He has baseline CKD. Recommend that he proceed with cycle #28 today as scheduled.   We will see him back for a follow up visit in 3 weeks for evaluation and repeat blood work before starting cycle #29  The patient was advised to call immediately if he has any concerning symptoms in the interval. The patient voices understanding of current disease status and treatment options and is in agreement with the current care plan. All questions were answered. The patient knows to call the clinic with any problems, questions or concerns. We can certainly see the patient much sooner if necessary    No orders of the defined types were placed in this encounter.    The total time spent in the appointment was 20-29 minutes  Amare Kontos L Candas Deemer, PA-C 08/26/21

## 2021-08-16 NOTE — Telephone Encounter (Signed)
Called pt he is aware of his paper work that was sent pt said he will pick up forms at next appt.

## 2021-08-16 NOTE — Telephone Encounter (Signed)
Patient is calling office requesting information about FMLA paperwork he said Nira Conn should have to fill out. He would also like to speak with Katrina. Would not disclose to me concern. AS, CMA

## 2021-08-26 ENCOUNTER — Inpatient Hospital Stay: Payer: No Typology Code available for payment source

## 2021-08-26 ENCOUNTER — Other Ambulatory Visit: Payer: Self-pay

## 2021-08-26 ENCOUNTER — Inpatient Hospital Stay: Payer: No Typology Code available for payment source | Attending: Internal Medicine | Admitting: Physician Assistant

## 2021-08-26 VITALS — BP 157/85 | HR 77 | Temp 97.6°F | Resp 19 | Ht 69.0 in | Wt 236.7 lb

## 2021-08-26 DIAGNOSIS — C3411 Malignant neoplasm of upper lobe, right bronchus or lung: Secondary | ICD-10-CM | POA: Insufficient documentation

## 2021-08-26 DIAGNOSIS — I129 Hypertensive chronic kidney disease with stage 1 through stage 4 chronic kidney disease, or unspecified chronic kidney disease: Secondary | ICD-10-CM | POA: Diagnosis not present

## 2021-08-26 DIAGNOSIS — C3491 Malignant neoplasm of unspecified part of right bronchus or lung: Secondary | ICD-10-CM | POA: Diagnosis not present

## 2021-08-26 DIAGNOSIS — Z79899 Other long term (current) drug therapy: Secondary | ICD-10-CM | POA: Insufficient documentation

## 2021-08-26 DIAGNOSIS — N189 Chronic kidney disease, unspecified: Secondary | ICD-10-CM | POA: Diagnosis not present

## 2021-08-26 DIAGNOSIS — E1122 Type 2 diabetes mellitus with diabetic chronic kidney disease: Secondary | ICD-10-CM | POA: Insufficient documentation

## 2021-08-26 DIAGNOSIS — Z5112 Encounter for antineoplastic immunotherapy: Secondary | ICD-10-CM | POA: Diagnosis present

## 2021-08-26 DIAGNOSIS — Z95828 Presence of other vascular implants and grafts: Secondary | ICD-10-CM

## 2021-08-26 LAB — CMP (CANCER CENTER ONLY)
ALT: 17 U/L (ref 0–44)
AST: 12 U/L — ABNORMAL LOW (ref 15–41)
Albumin: 3.5 g/dL (ref 3.5–5.0)
Alkaline Phosphatase: 71 U/L (ref 38–126)
Anion gap: 10 (ref 5–15)
BUN: 24 mg/dL — ABNORMAL HIGH (ref 8–23)
CO2: 22 mmol/L (ref 22–32)
Calcium: 9.6 mg/dL (ref 8.9–10.3)
Chloride: 106 mmol/L (ref 98–111)
Creatinine: 1.88 mg/dL — ABNORMAL HIGH (ref 0.61–1.24)
GFR, Estimated: 39 mL/min — ABNORMAL LOW (ref >60)
Glucose, Bld: 165 mg/dL — ABNORMAL HIGH (ref 70–99)
Potassium: 4.1 mmol/L (ref 3.5–5.1)
Sodium: 138 mmol/L (ref 135–145)
Total Bilirubin: 0.2 mg/dL — ABNORMAL LOW (ref 0.3–1.2)
Total Protein: 6.5 g/dL (ref 6.5–8.1)

## 2021-08-26 LAB — CBC WITH DIFFERENTIAL (CANCER CENTER ONLY)
Abs Immature Granulocytes: 0.05 10*3/uL (ref 0.00–0.07)
Basophils Absolute: 0 10*3/uL (ref 0.0–0.1)
Basophils Relative: 1 %
Eosinophils Absolute: 0.6 10*3/uL — ABNORMAL HIGH (ref 0.0–0.5)
Eosinophils Relative: 9 %
HCT: 35.5 % — ABNORMAL LOW (ref 39.0–52.0)
Hemoglobin: 12.6 g/dL — ABNORMAL LOW (ref 13.0–17.0)
Immature Granulocytes: 1 %
Lymphocytes Relative: 28 %
Lymphs Abs: 1.9 10*3/uL (ref 0.7–4.0)
MCH: 32.9 pg (ref 26.0–34.0)
MCHC: 35.5 g/dL (ref 30.0–36.0)
MCV: 92.7 fL (ref 80.0–100.0)
Monocytes Absolute: 0.7 10*3/uL (ref 0.1–1.0)
Monocytes Relative: 11 %
Neutro Abs: 3.4 10*3/uL (ref 1.7–7.7)
Neutrophils Relative %: 50 %
Platelet Count: 190 10*3/uL (ref 150–400)
RBC: 3.83 MIL/uL — ABNORMAL LOW (ref 4.22–5.81)
RDW: 14.6 % (ref 11.5–15.5)
WBC Count: 6.7 10*3/uL (ref 4.0–10.5)
nRBC: 0 % (ref 0.0–0.2)

## 2021-08-26 LAB — TSH: TSH: 0.901 u[IU]/mL (ref 0.320–4.118)

## 2021-08-26 MED ORDER — SODIUM CHLORIDE 0.9 % IV SOLN: Freq: Once | INTRAVENOUS | Status: AC

## 2021-08-26 MED ORDER — HEPARIN SOD (PORK) LOCK FLUSH 100 UNIT/ML IV SOLN
500.0000 [IU] | Freq: Once | INTRAVENOUS | Status: AC | PRN
  Administered 2021-08-26: 500 [IU]

## 2021-08-26 MED ORDER — SODIUM CHLORIDE 0.9% FLUSH
10.0000 mL | Freq: Once | INTRAVENOUS | Status: AC
  Administered 2021-08-26: 10 mL

## 2021-08-26 MED ORDER — SODIUM CHLORIDE 0.9% FLUSH
10.0000 mL | INTRAVENOUS | Status: DC | PRN
  Administered 2021-08-26: 10 mL

## 2021-08-26 MED ORDER — SODIUM CHLORIDE 0.9 % IV SOLN
200.0000 mg | Freq: Once | INTRAVENOUS | Status: AC
  Administered 2021-08-26: 200 mg via INTRAVENOUS
  Filled 2021-08-26: qty 8

## 2021-08-26 NOTE — Progress Notes (Signed)
[facility] with [facility] for treatment today per Dr.

## 2021-08-27 ENCOUNTER — Encounter: Payer: Self-pay | Admitting: Nurse Practitioner

## 2021-08-27 ENCOUNTER — Ambulatory Visit (INDEPENDENT_AMBULATORY_CARE_PROVIDER_SITE_OTHER): Payer: No Typology Code available for payment source | Admitting: Nurse Practitioner

## 2021-08-27 VITALS — BP 122/82 | HR 76 | Temp 98.3°F | Ht 69.0 in | Wt 240.7 lb

## 2021-08-27 DIAGNOSIS — M7989 Other specified soft tissue disorders: Secondary | ICD-10-CM | POA: Diagnosis not present

## 2021-08-27 DIAGNOSIS — E1122 Type 2 diabetes mellitus with diabetic chronic kidney disease: Secondary | ICD-10-CM

## 2021-08-27 DIAGNOSIS — G4733 Obstructive sleep apnea (adult) (pediatric): Secondary | ICD-10-CM

## 2021-08-27 DIAGNOSIS — I10 Essential (primary) hypertension: Secondary | ICD-10-CM | POA: Diagnosis not present

## 2021-08-27 DIAGNOSIS — F17210 Nicotine dependence, cigarettes, uncomplicated: Secondary | ICD-10-CM

## 2021-08-27 DIAGNOSIS — E782 Mixed hyperlipidemia: Secondary | ICD-10-CM | POA: Diagnosis not present

## 2021-08-27 DIAGNOSIS — Z6835 Body mass index (BMI) 35.0-35.9, adult: Secondary | ICD-10-CM

## 2021-08-27 DIAGNOSIS — N1831 Chronic kidney disease, stage 3a: Secondary | ICD-10-CM

## 2021-08-27 DIAGNOSIS — N529 Male erectile dysfunction, unspecified: Secondary | ICD-10-CM

## 2021-08-27 LAB — POCT GLYCOSYLATED HEMOGLOBIN (HGB A1C): Hemoglobin A1C: 6.2 % — AB (ref 4.0–5.6)

## 2021-08-27 MED ORDER — ATORVASTATIN CALCIUM 10 MG PO TABS: 10.0000 mg | ORAL_TABLET | Freq: Every day | ORAL | 1 refills | Status: DC

## 2021-08-27 MED ORDER — DAPAGLIFLOZIN PROPANEDIOL 10 MG PO TABS: 10.0000 mg | ORAL_TABLET | Freq: Every day | ORAL | 1 refills | Status: DC

## 2021-08-27 MED ORDER — SILDENAFIL CITRATE 100 MG PO TABS: ORAL_TABLET | ORAL | 5 refills | Status: DC

## 2021-08-27 MED ORDER — CARVEDILOL 3.125 MG PO TABS: 3.1250 mg | ORAL_TABLET | Freq: Two times a day (BID) | ORAL | 0 refills | Status: DC

## 2021-08-27 MED ORDER — TRIAMTERENE-HCTZ 37.5-25 MG PO TABS: 1.0000 | ORAL_TABLET | Freq: Every day | ORAL | 1 refills | Status: DC

## 2021-08-27 MED ORDER — AMLODIPINE BESYLATE 10 MG PO TABS: 10.0000 mg | ORAL_TABLET | Freq: Every day | ORAL | 1 refills | Status: DC

## 2021-08-27 MED ORDER — FUROSEMIDE 20 MG PO TABS: 20.0000 mg | ORAL_TABLET | Freq: Every day | ORAL | 0 refills | Status: DC

## 2021-08-27 MED ORDER — NICOTINE 21 MG/24HR TD PT24: 21.0000 mg | MEDICATED_PATCH | Freq: Every day | TRANSDERMAL | 1 refills | Status: DC

## 2021-08-27 NOTE — Patient Instructions (Addendum)
Fat and Cholesterol Restricted Eating Plan Getting too much fat and cholesterol in your diet may cause health problems. Choosing the right foods helps keep your fat and cholesterol at normal levels. This can keep you from getting certain diseases. Your doctor may recommend an eating plan that includes: Total fat: ______% or less of total calories a day. Saturated fat: ______% or less of total calories a day. Cholesterol: less than _________mg a day. Fiber: ______g a day. What are tips for following this plan? Meal planning At meals, divide your plate into four equal parts: Fill one-half of your plate with vegetables and green salads. Fill one-fourth of your plate with whole grains. Fill one-fourth of your plate with low-fat (lean) protein foods. Eat fish that is high in omega-3 fats at least two times a week. This includes mackerel, tuna, sardines, and salmon. Eat foods that are high in fiber, such as whole grains, beans, apples, broccoli, carrots, peas, and barley. General tips  Work with your doctor to lose weight if you need to. Avoid: Foods with added sugar. Fried foods. Foods with partially hydrogenated oils. Limit alcohol intake to no more than 1 drink a day for nonpregnant women and 2 drinks a day for men. One drink equals 12 oz of beer, 5 oz of wine, or 1 oz of hard liquor. Reading food labels Check food labels for: Trans fats. Partially hydrogenated oils. Saturated fat (g) in each serving. Cholesterol (mg) in each serving. Fiber (g) in each serving. Choose foods with healthy fats, such as: Monounsaturated fats. Polyunsaturated fats. Omega-3 fats. Choose grain products that have whole grains. Look for the word "whole" as the first word in the ingredient list. Cooking Cook foods using low-fat methods. These include baking, boiling, grilling, and broiling. Eat more home-cooked foods. Eat at restaurants and buffets less often. Avoid cooking using saturated fats, such as  butter, cream, palm oil, palm kernel oil, and coconut oil. Recommended foods Fruits All fresh, canned (in natural juice), or frozen fruits. Vegetables Fresh or frozen vegetables (raw, steamed, roasted, or grilled). Green salads. Grains Whole grains, such as whole wheat or whole grain breads, crackers, cereals, and pasta. Unsweetened oatmeal, bulgur, barley, quinoa, or brown rice. Corn or whole wheat flour tortillas. Meats and other protein foods Ground beef (85% or leaner), grass-fed beef, or beef trimmed of fat. Skinless chicken or [place]. Ground chicken or [place]. Pork trimmed of fat. All fish and seafood. Egg whites. Dried beans, peas, or lentils. Unsalted nuts or seeds. Unsalted canned beans. Nut butters without added sugar or oil. Dairy Low-fat or nonfat dairy products, such as skim or 1% milk, 2% or reduced-fat cheeses, low-fat and fat-free ricotta or cottage cheese, or plain low-fat and nonfat yogurt. Fats and oils Tub margarine without trans fats. Light or reduced-fat mayonnaise and salad dressings. Avocado. Olive, canola, sesame, or safflower oils. The items listed above may not be a complete list of foods and beverages you can eat. Contact a dietitian for more information. Foods to avoid Fruits Canned fruit in heavy syrup. Fruit in cream or butter sauce. Fried fruit. Vegetables Vegetables cooked in cheese, cream, or butter sauce. Fried vegetables. Grains White bread. White pasta. White rice. Cornbread. Bagels, pastries, and croissants. Crackers and snack foods that contain trans fat and hydrogenated oils. Meats and other protein foods Fatty cuts of meat. Ribs, chicken wings, bacon, sausage, bologna, salami, chitterlings, fatback, hot dogs, bratwurst, and packaged lunch meats. Liver and organ meats. Whole eggs and egg yolks. Chicken and [place]  with skin. Fried meat. Dairy Whole or 2% milk, cream, half-and-half, and cream cheese. Whole milk cheeses. Whole-fat or sweetened yogurt.  Full-fat cheeses. Nondairy creamers and whipped toppings. Processed cheese, cheese spreads, and cheese curds. Beverages Alcohol. Sugar-sweetened drinks such as sodas, lemonade, and fruit drinks. Fats and oils Butter, stick margarine, lard, shortening, ghee, or bacon fat. Coconut, palm kernel, and palm oils. Sweets and desserts Corn syrup, sugars, honey, and molasses. Candy. Jam and jelly. Syrup. Sweetened cereals. Cookies, pies, cakes, donuts, muffins, and ice cream. The items listed above may not be a complete list of foods and beverages you should avoid. Contact a dietitian for more information. Summary Choosing the right foods helps keep your fat and cholesterol at normal levels. This can keep you from getting certain diseases. At meals, fill one-half of your plate with vegetables and green salads. Eat high-fiber foods, like whole grains, beans, apples, carrots, peas, and barley. Limit added sugar, saturated fats, alcohol, and fried foods. This information is not intended to replace advice given to you by your health care provider. Make sure you discuss any questions you have with your health care provider. Document Revised: 02/26/2020 Document Reviewed: 02/26/2020 [employer] Patient Education  2022 [place] and Cholesterol Restricted Eating Plan Getting too much fat and cholesterol in your diet may cause health problems. Choosing the right foods helps keep your fat and cholesterol at normal levels. This can keep you from getting certain diseases. Your doctor may recommend an eating plan that includes: Total fat: ______% or less of total calories a day. Saturated fat: ______% or less of total calories a day. Cholesterol: less than _________mg a day. Fiber: ______g a day. What are tips for following this plan? Meal planning At meals, divide your plate into four equal parts: Fill one-half of your plate with vegetables and green salads. Fill one-fourth of your plate with whole  grains. Fill one-fourth of your plate with low-fat (lean) protein foods. Eat fish that is high in omega-3 fats at least two times a week. This includes mackerel, tuna, sardines, and salmon. Eat foods that are high in fiber, such as whole grains, beans, apples, broccoli, carrots, peas, and barley. General tips  Work with your doctor to lose weight if you need to. Avoid: Foods with added sugar. Fried foods. Foods with partially hydrogenated oils. Limit alcohol intake to no more than 1 drink a day for nonpregnant women and 2 drinks a day for men. One drink equals 12 oz of beer, 5 oz of wine, or 1 oz of hard liquor. Reading food labels Check food labels for: Trans fats. Partially hydrogenated oils. Saturated fat (g) in each serving. Cholesterol (mg) in each serving. Fiber (g) in each serving. Choose foods with healthy fats, such as: Monounsaturated fats. Polyunsaturated fats. Omega-3 fats. Choose grain products that have whole grains. Look for the word "whole" as the first word in the ingredient list. Cooking Cook foods using low-fat methods. These include baking, boiling, grilling, and broiling. Eat more home-cooked foods. Eat at restaurants and buffets less often. Avoid cooking using saturated fats, such as butter, cream, palm oil, palm kernel oil, and coconut oil. Recommended foods Fruits All fresh, canned (in natural juice), or frozen fruits. Vegetables Fresh or frozen vegetables (raw, steamed, roasted, or grilled). Green salads. Grains Whole grains, such as whole wheat or whole grain breads, crackers, cereals, and pasta. Unsweetened oatmeal, bulgur, barley, quinoa, or brown rice. Corn or whole wheat flour tortillas. Meats and other protein foods Ground  beef (85% or leaner), grass-fed beef, or beef trimmed of fat. Skinless chicken or [place]. Ground chicken or [place]. Pork trimmed of fat. All fish and seafood. Egg whites. Dried beans, peas, or lentils. Unsalted nuts or seeds.  Unsalted canned beans. Nut butters without added sugar or oil. Dairy Low-fat or nonfat dairy products, such as skim or 1% milk, 2% or reduced-fat cheeses, low-fat and fat-free ricotta or cottage cheese, or plain low-fat and nonfat yogurt. Fats and oils Tub margarine without trans fats. Light or reduced-fat mayonnaise and salad dressings. Avocado. Olive, canola, sesame, or safflower oils. The items listed above may not be a complete list of foods and beverages you can eat. Contact a dietitian for more information. Foods to avoid Fruits Canned fruit in heavy syrup. Fruit in cream or butter sauce. Fried fruit. Vegetables Vegetables cooked in cheese, cream, or butter sauce. Fried vegetables. Grains White bread. White pasta. White rice. Cornbread. Bagels, pastries, and croissants. Crackers and snack foods that contain trans fat and hydrogenated oils. Meats and other protein foods Fatty cuts of meat. Ribs, chicken wings, bacon, sausage, bologna, salami, chitterlings, fatback, hot dogs, bratwurst, and packaged lunch meats. Liver and organ meats. Whole eggs and egg yolks. Chicken and [place] with skin. Fried meat. Dairy Whole or 2% milk, cream, half-and-half, and cream cheese. Whole milk cheeses. Whole-fat or sweetened yogurt. Full-fat cheeses. Nondairy creamers and whipped toppings. Processed cheese, cheese spreads, and cheese curds. Beverages Alcohol. Sugar-sweetened drinks such as sodas, lemonade, and fruit drinks. Fats and oils Butter, stick margarine, lard, shortening, ghee, or bacon fat. Coconut, palm kernel, and palm oils. Sweets and desserts Corn syrup, sugars, honey, and molasses. Candy. Jam and jelly. Syrup. Sweetened cereals. Cookies, pies, cakes, donuts, muffins, and ice cream. The items listed above may not be a complete list of foods and beverages you should avoid. Contact a dietitian for more information. Summary Choosing the right foods helps keep your fat and cholesterol at normal  levels. This can keep you from getting certain diseases. At meals, fill one-half of your plate with vegetables and green salads. Eat high-fiber foods, like whole grains, beans, apples, carrots, peas, and barley. Limit added sugar, saturated fats, alcohol, and fried foods. This information is not intended to replace advice given to you by your health care provider. Make sure you discuss any questions you have with your health care provider. Document Revised: 02/26/2020 Document Reviewed: 02/26/2020 [employer] Patient Education  2022 [employer].

## 2021-08-27 NOTE — Progress Notes (Signed)
 Established Patient Office Visit  Subjective:  Patient ID: Kevin Fontan Jr., male    DOB: 07/28/1956  Age: 65 y.o. MRN: [id number]  CC:  Chief Complaint  Patient presents with   Follow-up   Diabetes    HPI Kevin Branton Jr. presents for follow up. He is diabetic. Blood sugars are well managed. His HgbA1c 6.1 today. This is stable from his most recent visit. He tolerates all medications. He states that he continues to tire easily. Exertion makes him fatigue even faster. He is treated at the cancer center for lung cancer. He does continue to smoke one long cigar during the day. He would like to try nicotine patches to help him quit this habit. He states that smoking these contributes to his lower leg pain and poor circulation.  The patient does need to have new prescriptions for all routine medications. Now that he is [facility], [facility] need [facility]  He does have sleep apnea. Currently has CPAP at home and does not like using it. Would like to be evaluated for Inspire device to treat sleep apnea. Does need to be referred to sleep apnea clinic for further evaluation.  He has no new concerns or complaints today. denies unusual chest pain, chest pressure, or shortness of breath. He denies headaches or visual disturbances. He denies abdominal pain, nausea, vomiting, or changes in bowel or bladder habits.    Past Medical History:  Diagnosis Date   Arthritis    Chronic kidney disease    Depression    DM (diabetes mellitus) (HCC)    type II   Dyslipidemia    Dyspnea    unable to walk much - he thinks it is from    GERD (gastroesophageal reflux disease)    Hepatitis    Hepatitis C- treated   History of kidney stones    HTN (hypertension)    Neuropathy    nscl ca dx'd 10/2019   OSA on CPAP    PTSD (post-traumatic stress disorder)     Past Surgical History:  Procedure Laterality Date   BIOPSY OF  MEDIASTINAL MASS  12/10/2019   Procedure: BIOPSY OF MEDIASTINAL MASS;  Surgeon: Icard, Bradley L, DO;  Location: [facility];  Service: Pulmonary;;  right upper lobe   BRONCHIAL NEEDLE ASPIRATION BIOPSY  12/10/2019   Procedure: BRONCHIAL NEEDLE ASPIRATION BIOPSIES;  Surgeon: Icard, Bradley L, DO;  Location: [facility];  Service: Pulmonary;;   COLONOSCOPY     IR IMAGING GUIDED PORT INSERTION  12/31/2019   kidney stone     URETERAL STENT PLACEMENT     Ureteral Stent Removed     VIDEO BRONCHOSCOPY WITH ENDOBRONCHIAL ULTRASOUND N/A 12/10/2019   Procedure: VIDEO BRONCHOSCOPY WITH ENDOBRONCHIAL ULTRASOUND;  Surgeon: Icard, Bradley L, DO;  Location: [facility];  Service: Pulmonary;  Laterality: N/A;    Family History  Problem Relation Age of Onset   Liver disease Mother    Diabetes Father     Social History   Socioeconomic History   Marital status: Single    Spouse name: Not on file   Number of children: Not on file   Years of education: Not on file   Highest education level: Not on file  Occupational History   Occupation: Door Greeter    Employer: [employer]  Tobacco Use   Smoking status: Some Days    Packs/day: 0.60    Years: 50.00      Pack years: 30.00    Types: Cigarettes   Smokeless tobacco: Never   Tobacco comments:    03/25/21: 1-2 Black & Milds  Vaping Use   Vaping Use: Never used  Substance and Sexual Activity   Alcohol use: Yes    Alcohol/week: 6.0 standard drinks    Types: 6 Cans of beer per week   Drug use: Not Currently   Sexual activity: Yes    Birth control/protection: None  Other Topics Concern   Not on file  Social History Narrative   Not on file   Social Determinants of Health   Financial Resource Strain: High Risk   Difficulty of Paying Living Expenses: Very hard  Food Insecurity: Food Insecurity Present   Worried About [place] in the Last Year: Sometimes true   Ran Out of Food in the Last Year: Sometimes true  Transportation Needs: No  Transportation Needs   Lack of Transportation (Medical): No   Lack of Transportation (Non-Medical): No  Physical Activity: Not on file  Stress: Stress Concern Present   Feeling of Stress : To some extent  Social Connections: Unknown   Frequency of Communication with Friends and Family: Three times a week   Frequency of Social Gatherings with Friends and Family: Three times a week   Attends Religious Services: Never   Active Member of Clubs or Organizations: No   Attends Archivist Meetings: Never   Marital Status: Not on file  Intimate Partner Violence: Not on file    Outpatient Medications Prior to Visit  Medication Sig Dispense Refill   Ascorbic Acid (VITAMIN C) 1000 MG tablet Take 1,000 mg by mouth daily.     aspirin EC 81 MG tablet Take 81 mg by mouth daily.     cholecalciferol (VITAMIN D3) 25 MCG (1000 UNIT) tablet Take 1,000 Units by mouth daily.     Cyanocobalamin (VITAMIN B 12 PO) Take 1 tablet by mouth daily.     magic mouthwash SOLN Take 5 mLs by mouth 4 (four) times daily as needed for mouth pain. Swish and swallow or spit 240 mL 2   meclizine (ANTIVERT) 12.5 MG tablet Take 1 tablet (12.5 mg total) by mouth 3 (three) times daily as needed for dizziness. 90 tablet 0   Propylene Glycol (SYSTANE BALANCE OP) Place 1 drop into both eyes daily.     zinc gluconate 50 MG tablet Take 50 mg by mouth daily.     amLODipine (NORVASC) 10 MG tablet Take 10 mg by mouth daily.     atorvastatin (LIPITOR) 10 MG tablet Take 10 mg by mouth daily.     carvedilol (COREG) 3.125 MG tablet Take 1 tablet (3.125 mg total) by mouth 2 (two) times daily with a meal. 180 tablet 0   dapagliflozin propanediol (FARXIGA) 10 MG TABS tablet Take 1 tablet (10 mg total) by mouth daily. 90 tablet 1   furosemide (LASIX) 20 MG tablet Take 1 tablet (20 mg total) by mouth daily. As needed 20 tablet 0   sildenafil (VIAGRA) 100 MG tablet TAKE ONE TABLET BY MOUTH AS INSTRUCTED (TAKE 1 HOUR PRIOR TO SEXUAL  ACTIVITY *DO NOT EXCEED 1 DOSE PER 24 HOUR PERIOD*)     triamterene-hydrochlorothiazide (MAXZIDE-25) 37.5-25 MG tablet Take 1 tablet by mouth daily.     No facility-administered medications prior to visit.    Allergies  Allergen Reactions   Metformin And Related Other (See Comments)    Severe muscle spasms   Naproxen  Pt states it have him off balance.   Oxycontin [Oxycodone] Hives    ROS Review of Systems  Constitutional:  Negative for activity change, chills, fatigue and fever.  HENT:  Positive for congestion, postnasal drip and rhinorrhea. Negative for sinus pressure, sinus pain, sneezing and sore throat.   Eyes:  Positive for itching.       Excessive eye tearing.  Respiratory:  Positive for shortness of breath. Negative for cough and wheezing.   Cardiovascular:  Negative for chest pain and palpitations.  Gastrointestinal:  Negative for constipation, diarrhea, nausea and vomiting.  Endocrine: Negative for cold intolerance, heat intolerance, polydipsia and polyuria.       Blood sugars doing well    Genitourinary:  Negative for dysuria, frequency and urgency.  Musculoskeletal:  Negative for back pain and myalgias.  Skin:  Negative for rash.  Allergic/Immunologic: Positive for environmental allergies.  Neurological:  Negative for dizziness, weakness and headaches.  Psychiatric/Behavioral:  The patient is not nervous/anxious.      Objective:    Physical Exam Vitals and nursing note reviewed.  Constitutional:      Appearance: Normal appearance. He is well-developed.  HENT:     Head: Normocephalic and atraumatic.     Nose: Nose normal.     Mouth/Throat:     Mouth: Mucous membranes are moist.     Pharynx: Oropharynx is clear.  Eyes:     Extraocular Movements: Extraocular movements intact.     Conjunctiva/sclera: Conjunctivae normal.     Pupils: Pupils are equal, round, and reactive to light.  Neck:     Vascular: No carotid bruit.  Cardiovascular:     Rate and  Rhythm: Normal rate and regular rhythm.     Pulses: Normal pulses.     Heart sounds: Normal heart sounds.  Pulmonary:     Effort: Pulmonary effort is normal.     Breath sounds: Normal breath sounds.     Comments: Clear but diminished breath sounds throughout the lung fields. Abdominal:     Palpations: Abdomen is soft.  Musculoskeletal:        General: Normal range of motion.     Cervical back: Normal range of motion and neck supple.  Lymphadenopathy:     Cervical: No cervical adenopathy.  Skin:    General: Skin is warm and dry.     Capillary Refill: Capillary refill takes less than 2 seconds.  Neurological:     General: No focal deficit present.     Mental Status: He is alert and oriented to person, place, and time.  Psychiatric:        Mood and Affect: Mood normal.        Behavior: Behavior normal.        Thought Content: Thought content normal.        Judgment: Judgment normal.    Today's Vitals   08/27/21 0857 08/27/21 0948  BP: (!) 156/76 122/82  Pulse: 76   Temp: 98.3 F (36.8 C)   SpO2: 96%   Weight: 240 lb 11.2 oz (109.2 kg)   Height: 5' 9" (1.753 m)    Body mass index is 35.55 kg/m.   Wt Readings from Last 3 Encounters:  08/27/21 240 lb 11.2 oz (109.2 kg)  08/26/21 236 lb 11.2 oz (107.4 kg)  08/05/21 228 lb 4.8 oz (103.6 kg)     Health Maintenance Due  Topic Date Due   Pneumonia Vaccine 65+ Years old (1 - PCV) 09/08/1962   OPHTHALMOLOGY EXAM    Never done   HIV Screening  Never done   Hepatitis C Screening  Never done   COLONOSCOPY (Pts 45-62yr Insurance coverage will need to be confirmed)  Never done   COVID-19 Vaccine (3 - Pfizer risk series) 01/28/2021    There are no preventive care reminders to display for this patient.  Lab Results  Component Value Date   TSH 0.901 08/26/2021   Lab Results  Component Value Date   WBC 6.7 08/26/2021   HGB 12.6 (L) 08/26/2021   HCT 35.5 (L) 08/26/2021   MCV 92.7 08/26/2021   PLT 190 08/26/2021   Lab  Results  Component Value Date   NA 138 08/26/2021   K 4.1 08/26/2021   CO2 22 08/26/2021   GLUCOSE 165 (H) 08/26/2021   BUN 24 (H) 08/26/2021   CREATININE 1.88 (H) 08/26/2021   BILITOT 0.2 (L) 08/26/2021   ALKPHOS 71 08/26/2021   AST 12 (L) 08/26/2021   ALT 17 08/26/2021   PROT 6.5 08/26/2021   ALBUMIN 3.5 08/26/2021   CALCIUM 9.6 08/26/2021   ANIONGAP 10 08/26/2021   EGFR 32 (L) 01/08/2021   No results found for: CHOL No results found for: HDL No results found for: LDLCALC No results found for: TRIG No results found for: CHOLHDL Lab Results  Component Value Date   HGBA1C 6.2 (A) 08/27/2021      Assessment & Plan:  1. Type 2 diabetes mellitus with stage 3a chronic kidney disease, without long-term current use of insulin (HCC) Hemoglobin A1c 6.2 today.  Continue Farxiga 10 mg daily.  [facility]  Recheck hemoglobin A1c at next visit. - dapagliflozin propanediol (FARXIGA) 10 MG TABS tablet; Take 1 tablet (10 mg total) by mouth daily.  Dispense: 90 tablet; Refill: 1 - POCT glycosylated hemoglobin (Hb A1C)  2. Primary hypertension Blood pressure stable.  Continue all BP medication as prescribed.  [facility] - amLODipine (NORVASC) 10 MG tablet; Take 1 tablet (10 mg total) by mouth daily.  Dispense: 90 tablet; Refill: 1 - carvedilol (COREG) 3.125 MG tablet; Take 1 tablet (3.125 mg total) by mouth 2 (two) times daily with a meal.  Dispense: 180 tablet; Refill: 0 - triamterene-hydrochlorothiazide (MAXZIDE-25) 37.5-25 MG tablet; Take 1 tablet by mouth daily.  Dispense: 90 tablet; Refill: 1  3. Mixed hyperlipidemia Continue Lipitor as prescribed. - atorvastatin (LIPITOR) 10 MG tablet; Take 1 tablet (10 mg total) by mouth daily.  Dispense: 90 tablet; Refill: 1  4. Swelling of both lower extremities May take Lasix 20 mg daily if needed.  New prescription sent to pharmacy today. - furosemide (LASIX) 20 MG tablet; Take 1 tablet (20 mg  total) by mouth daily. As needed  Dispense: 90 tablet; Refill: 0  5. Erectile dysfunction, unspecified erectile dysfunction type May continue sildenafil 100 mg as needed. - sildenafil (VIAGRA) 100 MG tablet; TAKE ONE TABLET BY MOUTH AS INSTRUCTED (TAKE 1 HOUR PRIOR TO SEXUAL ACTIVITY *DO NOT EXCEED 1 DOSE PER 24 HOUR PERIOD*)  Dispense: 10 tablet; Refill: 5  6. Body mass index (BMI) of 35.0-35.9 in adult Advised patient to limit calorie intake to 2000 cal/day.  Patient consume a low-fat, low-cholesterol diet and gradually incorporate exercise into his daily routine.  7. Cigarette nicotine dependence without complication Patient currently smoking 1 long cigar with multiple per day.  Discussed different doses of nicotine patches, and we decided to start with NicoDerm CQ 21 mg patch.  Advised him to change patch daily. .Marland Kitchen-  nicotine (NICODERM CQ - DOSED IN MG/24 HOURS) 21 mg/24hr patch; Place 1 patch (21 mg total) onto the skin daily.  Dispense: 30 patch; Refill: 1  8. Obstructive sleep apnea Refer to sleep disorders clinic for further evaluation and treatment. - Ambulatory referral to Sleep Studies   Problem List Items Addressed This Visit       Cardiovascular and Mediastinum   HTN (hypertension)   Relevant Medications   amLODipine (NORVASC) 10 MG tablet   atorvastatin (LIPITOR) 10 MG tablet   carvedilol (COREG) 3.125 MG tablet   furosemide (LASIX) 20 MG tablet   sildenafil (VIAGRA) 100 MG tablet   triamterene-hydrochlorothiazide (MAXZIDE-25) 37.5-25 MG tablet     Respiratory   Obstructive sleep apnea   Relevant Orders   Ambulatory referral to Sleep Studies     Endocrine   DM (diabetes mellitus) (HCC) - Primary   Relevant Medications   atorvastatin (LIPITOR) 10 MG tablet   dapagliflozin propanediol (FARXIGA) 10 MG TABS tablet   Other Relevant Orders   POCT glycosylated hemoglobin (Hb A1C) (Completed)     Other   Swelling of both lower extremities   Relevant Medications    furosemide (LASIX) 20 MG tablet   Hyperlipidemia   Relevant Medications   amLODipine (NORVASC) 10 MG tablet   atorvastatin (LIPITOR) 10 MG tablet   carvedilol (COREG) 3.125 MG tablet   furosemide (LASIX) 20 MG tablet   sildenafil (VIAGRA) 100 MG tablet   triamterene-hydrochlorothiazide (MAXZIDE-25) 37.5-25 MG tablet   Erectile dysfunction   Relevant Medications   sildenafil (VIAGRA) 100 MG tablet   Body mass index (BMI) of 35.0-35.9 in adult   Cigarette nicotine dependence without complication   Relevant Medications   nicotine (NICODERM CQ - DOSED IN MG/24 HOURS) 21 mg/24hr patch    Meds ordered this encounter  Medications   amLODipine (NORVASC) 10 MG tablet    Sig: Take 1 tablet (10 mg total) by mouth daily.    Dispense:  90 tablet    Refill:  1    Order Specific Question:   Supervising Provider    Answer:   METHENEY, CATHERINE D [[id number]]   nicotine (NICODERM CQ - DOSED IN MG/24 HOURS) 21 mg/24hr patch    Sig: Place 1 patch (21 mg total) onto the skin daily.    Dispense:  30 patch    Refill:  1    Order Specific Question:   Supervising Provider    Answer:   METHENEY, CATHERINE D [[id number]]   atorvastatin (LIPITOR) 10 MG tablet    Sig: Take 1 tablet (10 mg total) by mouth daily.    Dispense:  90 tablet    Refill:  1    Order Specific Question:   Supervising Provider    Answer:   METHENEY, CATHERINE D [[id number]]   carvedilol (COREG) 3.125 MG tablet    Sig: Take 1 tablet (3.125 mg total) by mouth 2 (two) times daily with a meal.    Dispense:  180 tablet    Refill:  0    Order Specific Question:   Supervising Provider    Answer:   METHENEY, CATHERINE D [[id number]]   dapagliflozin propanediol (FARXIGA) 10 MG TABS tablet    Sig: Take 1 tablet (10 mg total) by mouth daily.    Dispense:  90 tablet    Refill:  1    Patient has had negative side effects from metformin and glipizide    Order Specific Question:   Supervising Provider    Answer:     METHENEY, CATHERINE D [[id number]]   furosemide  (LASIX) 20 MG tablet    Sig: Take 1 tablet (20 mg total) by mouth daily. As needed    Dispense:  90 tablet    Refill:  0    Order Specific Question:   Supervising Provider    Answer:   Beatrice Lecher D [[id number]]   sildenafil (VIAGRA) 100 MG tablet    Sig: TAKE ONE TABLET BY MOUTH AS INSTRUCTED (TAKE 1 HOUR PRIOR TO SEXUAL ACTIVITY *DO NOT EXCEED 1 DOSE PER 24 HOUR PERIOD*)    Dispense:  10 tablet    Refill:  5    Order Specific Question:   Supervising Provider    Answer:   Hali Marry [[id number]]   triamterene-hydrochlorothiazide (MAXZIDE-25) 37.5-25 MG tablet    Sig: Take 1 tablet by mouth daily.    Dispense:  90 tablet    Refill:  1    Order Specific Question:   Supervising Provider    Answer:   Beatrice Lecher D [[id number]]     Follow-up: Return in about 3 months (around 11/27/2021) for diabetes with HgbA1c check.    [name], NP  This note was dictated using Systems analyst. Rapid proofreading was performed to expedite the delivery of the information. Despite proofreading, phonetic errors will occur which are common with this voice recognition software. Please take this into consideration. If there are any concerns, please contact our office.

## 2021-08-29 DIAGNOSIS — Z6835 Body mass index (BMI) 35.0-35.9, adult: Secondary | ICD-10-CM | POA: Insufficient documentation

## 2021-08-29 DIAGNOSIS — N529 Male erectile dysfunction, unspecified: Secondary | ICD-10-CM | POA: Insufficient documentation

## 2021-08-29 DIAGNOSIS — Z6832 Body mass index (BMI) 32.0-32.9, adult: Secondary | ICD-10-CM | POA: Insufficient documentation

## 2021-08-29 DIAGNOSIS — Z6837 Body mass index (BMI) 37.0-37.9, adult: Secondary | ICD-10-CM | POA: Insufficient documentation

## 2021-08-29 DIAGNOSIS — F17210 Nicotine dependence, cigarettes, uncomplicated: Secondary | ICD-10-CM | POA: Insufficient documentation

## 2021-09-16 ENCOUNTER — Other Ambulatory Visit: Payer: Self-pay | Admitting: Nurse Practitioner

## 2021-09-16 ENCOUNTER — Telehealth: Payer: Self-pay | Admitting: Nurse Practitioner

## 2021-09-16 ENCOUNTER — Other Ambulatory Visit: Payer: Self-pay

## 2021-09-16 ENCOUNTER — Inpatient Hospital Stay: Payer: No Typology Code available for payment source | Attending: Internal Medicine

## 2021-09-16 ENCOUNTER — Encounter: Payer: Self-pay | Admitting: Internal Medicine

## 2021-09-16 ENCOUNTER — Inpatient Hospital Stay (HOSPITAL_BASED_OUTPATIENT_CLINIC_OR_DEPARTMENT_OTHER): Payer: No Typology Code available for payment source | Admitting: Internal Medicine

## 2021-09-16 ENCOUNTER — Inpatient Hospital Stay: Payer: No Typology Code available for payment source

## 2021-09-16 VITALS — BP 153/71 | HR 80 | Temp 97.8°F | Resp 20 | Ht 69.0 in | Wt 235.1 lb

## 2021-09-16 DIAGNOSIS — I129 Hypertensive chronic kidney disease with stage 1 through stage 4 chronic kidney disease, or unspecified chronic kidney disease: Secondary | ICD-10-CM | POA: Diagnosis not present

## 2021-09-16 DIAGNOSIS — N1831 Chronic kidney disease, stage 3a: Secondary | ICD-10-CM

## 2021-09-16 DIAGNOSIS — C3490 Malignant neoplasm of unspecified part of unspecified bronchus or lung: Secondary | ICD-10-CM | POA: Diagnosis not present

## 2021-09-16 DIAGNOSIS — Z79899 Other long term (current) drug therapy: Secondary | ICD-10-CM | POA: Diagnosis not present

## 2021-09-16 DIAGNOSIS — Z5112 Encounter for antineoplastic immunotherapy: Secondary | ICD-10-CM | POA: Diagnosis not present

## 2021-09-16 DIAGNOSIS — C3411 Malignant neoplasm of upper lobe, right bronchus or lung: Secondary | ICD-10-CM | POA: Insufficient documentation

## 2021-09-16 DIAGNOSIS — C3491 Malignant neoplasm of unspecified part of right bronchus or lung: Secondary | ICD-10-CM

## 2021-09-16 DIAGNOSIS — E1122 Type 2 diabetes mellitus with diabetic chronic kidney disease: Secondary | ICD-10-CM

## 2021-09-16 DIAGNOSIS — N189 Chronic kidney disease, unspecified: Secondary | ICD-10-CM | POA: Diagnosis not present

## 2021-09-16 DIAGNOSIS — Z95828 Presence of other vascular implants and grafts: Secondary | ICD-10-CM

## 2021-09-16 LAB — CMP (CANCER CENTER ONLY)
ALT: 23 U/L (ref 0–44)
AST: 18 U/L (ref 15–41)
Albumin: 3.8 g/dL (ref 3.5–5.0)
Alkaline Phosphatase: 59 U/L (ref 38–126)
Anion gap: 9 (ref 5–15)
BUN: 33 mg/dL — ABNORMAL HIGH (ref 8–23)
CO2: 27 mmol/L (ref 22–32)
Calcium: 10 mg/dL (ref 8.9–10.3)
Chloride: 99 mmol/L (ref 98–111)
Creatinine: 1.96 mg/dL — ABNORMAL HIGH (ref 0.61–1.24)
GFR, Estimated: 37 mL/min — ABNORMAL LOW (ref >60)
Glucose, Bld: 120 mg/dL — ABNORMAL HIGH (ref 70–99)
Potassium: 4 mmol/L (ref 3.5–5.1)
Sodium: 135 mmol/L (ref 135–145)
Total Bilirubin: 0.4 mg/dL (ref 0.3–1.2)
Total Protein: 6.9 g/dL (ref 6.5–8.1)

## 2021-09-16 LAB — CBC WITH DIFFERENTIAL (CANCER CENTER ONLY)
Abs Immature Granulocytes: 0.04 10*3/uL (ref 0.00–0.07)
Basophils Absolute: 0 10*3/uL (ref 0.0–0.1)
Basophils Relative: 1 %
Eosinophils Absolute: 0.5 10*3/uL (ref 0.0–0.5)
Eosinophils Relative: 8 %
HCT: 38.1 % — ABNORMAL LOW (ref 39.0–52.0)
Hemoglobin: 13.3 g/dL (ref 13.0–17.0)
Immature Granulocytes: 1 %
Lymphocytes Relative: 34 %
Lymphs Abs: 2 10*3/uL (ref 0.7–4.0)
MCH: 32.5 pg (ref 26.0–34.0)
MCHC: 34.9 g/dL (ref 30.0–36.0)
MCV: 93.2 fL (ref 80.0–100.0)
Monocytes Absolute: 0.6 10*3/uL (ref 0.1–1.0)
Monocytes Relative: 10 %
Neutro Abs: 2.7 10*3/uL (ref 1.7–7.7)
Neutrophils Relative %: 46 %
Platelet Count: 182 10*3/uL (ref 150–400)
RBC: 4.09 MIL/uL — ABNORMAL LOW (ref 4.22–5.81)
RDW: 14.3 % (ref 11.5–15.5)
WBC Count: 5.8 10*3/uL (ref 4.0–10.5)
nRBC: 0 % (ref 0.0–0.2)

## 2021-09-16 LAB — TSH: TSH: 1.21 u[IU]/mL (ref 0.320–4.118)

## 2021-09-16 MED ORDER — EMPAGLIFLOZIN 25 MG PO TABS: 25.0000 mg | ORAL_TABLET | Freq: Every day | ORAL | 1 refills | Status: DC

## 2021-09-16 MED ORDER — SODIUM CHLORIDE 0.9% FLUSH
10.0000 mL | Freq: Once | INTRAVENOUS | Status: AC
  Administered 2021-09-16: 10 mL

## 2021-09-16 MED ORDER — SODIUM CHLORIDE 0.9 % IV SOLN
200.0000 mg | Freq: Once | INTRAVENOUS | Status: AC
  Administered 2021-09-16: 200 mg via INTRAVENOUS
  Filled 2021-09-16: qty 8

## 2021-09-16 MED ORDER — SODIUM CHLORIDE 0.9 % IV SOLN: Freq: Once | INTRAVENOUS | Status: AC

## 2021-09-16 MED ORDER — SODIUM CHLORIDE 0.9% FLUSH
10.0000 mL | INTRAVENOUS | Status: DC | PRN
  Administered 2021-09-16: 10 mL

## 2021-09-16 MED ORDER — HEPARIN SOD (PORK) LOCK FLUSH 100 UNIT/ML IV SOLN
500.0000 [IU] | Freq: Once | INTRAVENOUS | Status: AC | PRN
  Administered 2021-09-16: 500 [IU]

## 2021-09-16 NOTE — Telephone Encounter (Signed)
Called and spoke with Kevin Lambert he is aware of the new Rx

## 2021-09-16 NOTE — Telephone Encounter (Signed)
Can you call [facility]yse Amass and let them know that I stopped [place] and added Jardiance 25mg  daily instead. I have sent the new prescription to [facility] in [place]. Thanks so much.   -HB

## 2021-09-16 NOTE — Progress Notes (Signed)
MD aware of creatinine 1.96.  Ok to proceed with treatment

## 2021-09-16 NOTE — Telephone Encounter (Signed)
Denyse Amass with the [facility] called into the office to get a medication change.  The [facility] does not carry [place].  The preferred medication is Jardiance.  Denyse Amass would like a callback to get this medication changed for the patient.  Please call Denyse Amass at [phone] x [phone] with this approval.

## 2021-09-16 NOTE — Progress Notes (Signed)
[facility] [phone]   [phone]  OFFICE PROGRESS NOTE  [name], NP [place] [address]  DIAGNOSIS: Stage IV (T2b, N3, M1a) non-small cell lung cancer, adenocarcinoma presented with large right upper lobe lung mass in addition to mediastinal and right supraclavicular lymphadenopathy as well as bilateral pulmonary nodules diagnosed in February 2021.  MOLECULAR STUDY by [employer]:  KRASG12C, 4.3%, Binimetinib  [id number], 0.9%, Niraparib, Olaparib, Rucaparib,Talazoparib, Tazemetostat  [id number], 1.7%, None   PRIOR THERAPY: None  CURRENT THERAPY: Systemic chemotherapy with carboplatin for AUC of 5, Alimta 500 mg/M2 and Keytruda 200 mg IV every 3 weeks.  First dose December 31, 2019.  Status post 28 cycles.  The last few cycles he has been treated with single agent Keytruda secondary to renal insufficiency.  INTERVAL HISTORY: Kevin Lambert. 65 y.o. male returns to the clinic today for follow-up visit.  The patient is feeling fine today with no concerning complaints.  He denied having any current chest pain, shortness of breath, cough or hemoptysis.  He denied having any nausea, vomiting, diarrhea or constipation.  He has no headache or visual changes.  He denied having any significant weight loss or night sweats.  The patient is here today for evaluation before starting cycle #29 of his treatment.  MEDICAL HISTORY: Past Medical History:  Diagnosis Date   Arthritis    Chronic kidney disease    Depression    DM (diabetes mellitus) ([place])    type II   Dyslipidemia    Dyspnea    unable to walk much - he thinks it is from    GERD (gastroesophageal reflux disease)    Hepatitis    Hepatitis C- treated   History of kidney stones    HTN (hypertension)    Neuropathy    nscl ca dx'd 10/2019   OSA on CPAP    PTSD (post-traumatic stress disorder)     ALLERGIES:  is allergic to metformin and related,  naproxen, and oxycontin [oxycodone].  MEDICATIONS:  Current Outpatient Medications  Medication Sig Dispense Refill   amLODipine (NORVASC) 10 MG tablet Take 1 tablet (10 mg total) by mouth daily. 90 tablet 1   Ascorbic Acid (VITAMIN C) 1000 MG tablet Take 1,000 mg by mouth daily.     aspirin EC 81 MG tablet Take 81 mg by mouth daily.     atorvastatin (LIPITOR) 10 MG tablet Take 1 tablet (10 mg total) by mouth daily. 90 tablet 1   carvedilol (COREG) 3.125 MG tablet Take 1 tablet (3.125 mg total) by mouth 2 (two) times daily with a meal. 180 tablet 0   cholecalciferol (VITAMIN D3) 25 MCG (1000 UNIT) tablet Take 1,000 Units by mouth daily.     Cyanocobalamin (VITAMIN B 12 PO) Take 1 tablet by mouth daily.     dapagliflozin propanediol (FARXIGA) 10 MG TABS tablet Take 1 tablet (10 mg total) by mouth daily. 90 tablet 1   furosemide (LASIX) 20 MG tablet Take 1 tablet (20 mg total) by mouth daily. As needed 90 tablet 0   magic mouthwash SOLN Take 5 mLs by mouth 4 (four) times daily as needed for mouth pain. Swish and swallow or spit 240 mL 2   meclizine (ANTIVERT) 25 MG tablet TAKE ONE-HALF TABLET BY MOUTH THREE TIMES A DAY AS NEEDED FOR DIZZINESS     nicotine (NICODERM CQ - DOSED IN MG/24 HOURS) 21 mg/24hr patch Place 1 patch (  21 mg total) onto the skin daily. 30 patch 1   Propylene Glycol (SYSTANE BALANCE OP) Place 1 drop into both eyes daily.     sildenafil (VIAGRA) 100 MG tablet TAKE ONE TABLET BY MOUTH AS INSTRUCTED (TAKE 1 HOUR PRIOR TO SEXUAL ACTIVITY *DO NOT EXCEED 1 DOSE PER 24 HOUR PERIOD*) 10 tablet 5   triamterene-hydrochlorothiazide (MAXZIDE-25) 37.5-25 MG tablet Take 1 tablet by mouth daily. 90 tablet 1   zinc gluconate 50 MG tablet Take 50 mg by mouth daily.     No current facility-administered medications for this visit.    SURGICAL HISTORY:  Past Surgical History:  Procedure Laterality Date   BIOPSY OF MEDIASTINAL MASS  12/10/2019   Procedure: BIOPSY OF MEDIASTINAL MASS;  Surgeon:  Garner Nash, DO;  Location: [facility];  Service: Pulmonary;;  right upper lobe   BRONCHIAL NEEDLE ASPIRATION BIOPSY  12/10/2019   Procedure: BRONCHIAL NEEDLE ASPIRATION BIOPSIES;  Surgeon: Garner Nash, DO;  Location: [facility];  Service: Pulmonary;;   COLONOSCOPY     IR IMAGING GUIDED PORT INSERTION  12/31/2019   kidney stone     URETERAL STENT PLACEMENT     Ureteral Stent Removed     VIDEO BRONCHOSCOPY WITH ENDOBRONCHIAL ULTRASOUND N/A 12/10/2019   Procedure: VIDEO BRONCHOSCOPY WITH ENDOBRONCHIAL ULTRASOUND;  Surgeon: Garner Nash, DO;  Location: [facility];  Service: Pulmonary;  Laterality: N/A;    REVIEW OF SYSTEMS:  A comprehensive review of systems was negative.   PHYSICAL EXAMINATION: General appearance: alert, cooperative, and no distress Head: Normocephalic, without obvious abnormality, atraumatic Neck: no adenopathy, no JVD, supple, symmetrical, trachea midline, and thyroid not enlarged, symmetric, no tenderness/mass/nodules Lymph nodes: Cervical, supraclavicular, and axillary nodes normal. Resp: clear to auscultation bilaterally Back: symmetric, no curvature. ROM normal. No CVA tenderness. Cardio: regular rate and rhythm, S1, S2 normal, no murmur, click, rub or gallop GI: soft, non-tender; bowel sounds normal; no masses,  no organomegaly Extremities: extremities normal, atraumatic, no cyanosis or edema  ECOG PERFORMANCE STATUS: 1 - Symptomatic but completely ambulatory  Blood pressure (!) 153/71, pulse 80, temperature 97.8 F (36.6 C), temperature source Tympanic, resp. rate 20, height 5\' 9"  (1.753 m), weight 235 lb 1.6 oz (106.6 kg), SpO2 98 %.  LABORATORY DATA: Lab Results  Component Value Date   WBC 5.8 09/16/2021   HGB 13.3 09/16/2021   HCT 38.1 (L) 09/16/2021   MCV 93.2 09/16/2021   PLT 182 09/16/2021      Chemistry      Component Value Date/Time   NA 135 09/16/2021 0944   NA 138 01/08/2021 0955   K 4.0 09/16/2021 0944   CL 99 09/16/2021  0944   CO2 27 09/16/2021 0944   BUN 33 (H) 09/16/2021 0944   BUN 28 (H) 01/08/2021 0955   CREATININE 1.96 (H) 09/16/2021 0944      Component Value Date/Time   CALCIUM 10.0 09/16/2021 0944   ALKPHOS 59 09/16/2021 0944   AST 18 09/16/2021 0944   ALT 23 09/16/2021 0944   BILITOT 0.4 09/16/2021 0944       RADIOGRAPHIC STUDIES: No results found.   ASSESSMENT AND PLAN: This is a very pleasant 65 years old African-American male recently diagnosed with a stage IV non-small cell lung cancer, adenocarcinoma with no actionable mutations presented with large right lower lobe lung mass in addition to mediastinal and right supraclavicular lymphadenopathy as well as bilateral pulmonary nodules diagnosed in February 2021. I explained to the patient that he has incurable  condition and all the treatment options will be of palliative nature. The patient has no actionable mutations on the recent molecular studies and he is not a candidate for the [facility] clinical trial. He started systemic chemotherapy with carboplatin, Alimta and Keytruda status post 28 cycles.  Starting from cycle #5 the patient is on maintenance treatment with Alimta and Keytruda every 3 weeks.  He has been recently on single agent Keytruda for several cycles secondary to renal insufficiency. The patient continues to tolerate his treatment well with no concerning adverse effects. I recommended for him to proceed with cycle #29 today as planned. I will see him back for follow-up visit in 3 weeks for evaluation with repeat CT scan of the chest, abdomen pelvis for restaging of his disease. For the hypertension, he will continue to monitor his blood pressure closely and discuss with his primary care physician for adjustment of his medication if needed. The patient was advised to call immediately if he has any other concerning symptoms in the interval. The patient voices understanding of current disease status and treatment options and is in  agreement with the current care plan.  All questions were answered. The patient knows to call the clinic with any problems, questions or concerns. We can certainly see the patient much sooner if necessary.  Disclaimer: This note was dictated with voice recognition software. Similar sounding words can inadvertently be transcribed and may not be corrected upon review.

## 2021-09-16 NOTE — Patient Instructions (Signed)
[facility]  Discharge Instructions: ?Thank you for choosing [facility] to provide your oncology and hematology care.  ? ?If you have a lab appointment with the Cancer Center, please go directly to the Cancer Center and check in at the registration area. ?  ?Wear comfortable clothing and clothing appropriate for easy access to any Portacath or PICC line.  ? ?We strive to give you quality time with your provider. You may need to reschedule your appointment if you arrive late (15 or more minutes).  Arriving late affects you and other patients whose appointments are after yours.  Also, if you miss three or more appointments without notifying the office, you may be dismissed from the clinic at the provider?s discretion.    ?  ?For prescription refill requests, have your pharmacy contact our office and allow 72 hours for refills to be completed.   ? ?Today you received the following chemotherapy and/or immunotherapy agents: Keytruda ?  ?To help prevent nausea and vomiting after your treatment, we encourage you to take your nausea medication as directed. ? ?BELOW ARE SYMPTOMS THAT SHOULD BE REPORTED IMMEDIATELY: ?*FEVER GREATER THAN 100.4 F (38 ?C) OR HIGHER ?*CHILLS OR SWEATING ?*NAUSEA AND VOMITING THAT IS NOT CONTROLLED WITH YOUR NAUSEA MEDICATION ?*UNUSUAL SHORTNESS OF BREATH ?*UNUSUAL BRUISING OR BLEEDING ?*URINARY PROBLEMS (pain or burning when urinating, or frequent urination) ?*BOWEL PROBLEMS (unusual diarrhea, constipation, pain near the anus) ?TENDERNESS IN MOUTH AND THROAT WITH OR WITHOUT PRESENCE OF ULCERS (sore throat, sores in mouth, or a toothache) ?UNUSUAL RASH, SWELLING OR PAIN  ?UNUSUAL VAGINAL DISCHARGE OR ITCHING  ? ?Items with * indicate a potential emergency and should be followed up as soon as possible or go to the Emergency Department if any problems should occur. ? ?Please show the CHEMOTHERAPY ALERT CARD or IMMUNOTHERAPY ALERT CARD at check-in to the  Emergency Department and triage nurse. ? ?Should you have questions after your visit or need to cancel or reschedule your appointment, please contact [facility]  Dept: [phone]  and follow the prompts.  Office hours are 8:00 a.m. to 4:30 p.m. Monday - Friday. Please note that voicemails left after 4:00 p.m. may not be returned until the following business day.  We are closed weekends and major holidays. You have access to a nurse at all times for urgent questions. Please call the main number to the clinic Dept: [phone] and follow the prompts. ? ? ?For any non-urgent questions, you may also contact your provider using MyChart. We now offer e-Visits for anyone 18 and older to request care online for non-urgent symptoms. For details visit [email or web address]. ?  ?Also download the MyChart app! Go to the app store, search "MyChart", open the app, select [facility], and log in with your MyChart username and password. ? ?Due to Covid, a mask is required upon entering the hospital/clinic. If you do not have a mask, one will be given to you upon arrival. For doctor visits, patients may have 1 support person aged 18 or older with them. For treatment visits, patients cannot have anyone with them due to current Covid guidelines and our immunocompromised population.  ? ?

## 2021-10-05 ENCOUNTER — Ambulatory Visit (HOSPITAL_COMMUNITY): Payer: No Typology Code available for payment source

## 2021-10-07 ENCOUNTER — Other Ambulatory Visit: Payer: Self-pay

## 2021-10-07 ENCOUNTER — Inpatient Hospital Stay (HOSPITAL_BASED_OUTPATIENT_CLINIC_OR_DEPARTMENT_OTHER): Payer: No Typology Code available for payment source | Admitting: Internal Medicine

## 2021-10-07 ENCOUNTER — Inpatient Hospital Stay: Payer: No Typology Code available for payment source | Attending: Internal Medicine

## 2021-10-07 ENCOUNTER — Inpatient Hospital Stay: Payer: No Typology Code available for payment source

## 2021-10-07 ENCOUNTER — Encounter: Payer: Self-pay | Admitting: Internal Medicine

## 2021-10-07 VITALS — BP 150/69 | HR 69 | Temp 96.3°F | Resp 20 | Ht 69.0 in | Wt 243.7 lb

## 2021-10-07 DIAGNOSIS — I129 Hypertensive chronic kidney disease with stage 1 through stage 4 chronic kidney disease, or unspecified chronic kidney disease: Secondary | ICD-10-CM | POA: Diagnosis not present

## 2021-10-07 DIAGNOSIS — N189 Chronic kidney disease, unspecified: Secondary | ICD-10-CM | POA: Insufficient documentation

## 2021-10-07 DIAGNOSIS — Z5112 Encounter for antineoplastic immunotherapy: Secondary | ICD-10-CM

## 2021-10-07 DIAGNOSIS — C3491 Malignant neoplasm of unspecified part of right bronchus or lung: Secondary | ICD-10-CM

## 2021-10-07 DIAGNOSIS — R197 Diarrhea, unspecified: Secondary | ICD-10-CM | POA: Diagnosis not present

## 2021-10-07 DIAGNOSIS — E1122 Type 2 diabetes mellitus with diabetic chronic kidney disease: Secondary | ICD-10-CM | POA: Diagnosis not present

## 2021-10-07 DIAGNOSIS — C3411 Malignant neoplasm of upper lobe, right bronchus or lung: Secondary | ICD-10-CM | POA: Insufficient documentation

## 2021-10-07 DIAGNOSIS — Z79899 Other long term (current) drug therapy: Secondary | ICD-10-CM | POA: Diagnosis not present

## 2021-10-07 DIAGNOSIS — Z95828 Presence of other vascular implants and grafts: Secondary | ICD-10-CM

## 2021-10-07 LAB — CBC WITH DIFFERENTIAL (CANCER CENTER ONLY)
Abs Immature Granulocytes: 0.04 10*3/uL (ref 0.00–0.07)
Basophils Absolute: 0.1 10*3/uL (ref 0.0–0.1)
Basophils Relative: 1 %
Eosinophils Absolute: 0.4 10*3/uL (ref 0.0–0.5)
Eosinophils Relative: 7 %
HCT: 37.4 % — ABNORMAL LOW (ref 39.0–52.0)
Hemoglobin: 13.1 g/dL (ref 13.0–17.0)
Immature Granulocytes: 1 %
Lymphocytes Relative: 32 %
Lymphs Abs: 2 10*3/uL (ref 0.7–4.0)
MCH: 32.8 pg (ref 26.0–34.0)
MCHC: 35 g/dL (ref 30.0–36.0)
MCV: 93.7 fL (ref 80.0–100.0)
Monocytes Absolute: 0.7 10*3/uL (ref 0.1–1.0)
Monocytes Relative: 11 %
Neutro Abs: 3.1 10*3/uL (ref 1.7–7.7)
Neutrophils Relative %: 48 %
Platelet Count: 170 10*3/uL (ref 150–400)
RBC: 3.99 MIL/uL — ABNORMAL LOW (ref 4.22–5.81)
RDW: 14.3 % (ref 11.5–15.5)
WBC Count: 6.3 10*3/uL (ref 4.0–10.5)
nRBC: 0 % (ref 0.0–0.2)

## 2021-10-07 LAB — CMP (CANCER CENTER ONLY)
ALT: 20 U/L (ref 0–44)
AST: 17 U/L (ref 15–41)
Albumin: 3.7 g/dL (ref 3.5–5.0)
Alkaline Phosphatase: 63 U/L (ref 38–126)
Anion gap: 8 (ref 5–15)
BUN: 41 mg/dL — ABNORMAL HIGH (ref 8–23)
CO2: 22 mmol/L (ref 22–32)
Calcium: 10.1 mg/dL (ref 8.9–10.3)
Chloride: 104 mmol/L (ref 98–111)
Creatinine: 2.37 mg/dL — ABNORMAL HIGH (ref 0.61–1.24)
GFR, Estimated: 29 mL/min — ABNORMAL LOW (ref >60)
Glucose, Bld: 158 mg/dL — ABNORMAL HIGH (ref 70–99)
Potassium: 4.6 mmol/L (ref 3.5–5.1)
Sodium: 134 mmol/L — ABNORMAL LOW (ref 135–145)
Total Bilirubin: 0.3 mg/dL (ref 0.3–1.2)
Total Protein: 6.7 g/dL (ref 6.5–8.1)

## 2021-10-07 LAB — TSH: TSH: 1.697 u[IU]/mL (ref 0.320–4.118)

## 2021-10-07 MED ORDER — HEPARIN SOD (PORK) LOCK FLUSH 100 UNIT/ML IV SOLN
500.0000 [IU] | Freq: Once | INTRAVENOUS | Status: AC | PRN
  Administered 2021-10-07: 500 [IU]

## 2021-10-07 MED ORDER — SODIUM CHLORIDE 0.9% FLUSH
10.0000 mL | Freq: Once | INTRAVENOUS | Status: AC
  Administered 2021-10-07: 10 mL

## 2021-10-07 MED ORDER — SODIUM CHLORIDE 0.9% FLUSH
10.0000 mL | INTRAVENOUS | Status: DC | PRN
  Administered 2021-10-07: 10 mL

## 2021-10-07 MED ORDER — SODIUM CHLORIDE 0.9 % IV SOLN
200.0000 mg | Freq: Once | INTRAVENOUS | Status: AC
  Administered 2021-10-07: 200 mg via INTRAVENOUS
  Filled 2021-10-07: qty 8

## 2021-10-07 MED ORDER — SODIUM CHLORIDE 0.9 % IV SOLN: Freq: Once | INTRAVENOUS | Status: AC

## 2021-10-07 NOTE — Progress Notes (Signed)
[facility] [phone]   [phone]  OFFICE PROGRESS NOTE  [name], NP [place] [address]  DIAGNOSIS: Stage IV (T2b, N3, M1a) non-small cell lung cancer, adenocarcinoma presented with large right upper lobe lung mass in addition to mediastinal and right supraclavicular lymphadenopathy as well as bilateral pulmonary nodules diagnosed in February 2021.  MOLECULAR STUDY by [employer]:  KRASG12C, 4.3%, Binimetinib  [id number], 0.9%, Niraparib, Olaparib, Rucaparib,Talazoparib, Tazemetostat  [id number], 1.7%, None   PRIOR THERAPY: None  CURRENT THERAPY: Systemic chemotherapy with carboplatin for AUC of 5, Alimta 500 mg/M2 and Keytruda 200 mg IV every 3 weeks.  First dose December 31, 2019.  Status post 29 cycles.  The last few cycles he has been treated with single agent Keytruda secondary to renal insufficiency.  INTERVAL HISTORY: Kevin Lambert. 65 y.o. male returns to the clinic today for follow-up visit.  The patient is feeling fine today with no concerning complaints.  He denied having any chest pain, shortness of breath, cough or hemoptysis.  He denied having any fever or chills.  He has no nausea, vomiting, diarrhea or constipation.  He has no headache or visual changes.  He was supposed to have repeat CT scan of the chest, abdomen and pelvis before this visit but unfortunately he missed the appointment.  He is here today for evaluation before starting cycle #30 of his treatment.   MEDICAL HISTORY: Past Medical History:  Diagnosis Date   Arthritis    Chronic kidney disease    Depression    DM (diabetes mellitus) ([place])    type II   Dyslipidemia    Dyspnea    unable to walk much - he thinks it is from    GERD (gastroesophageal reflux disease)    Hepatitis    Hepatitis C- treated   History of kidney stones    HTN (hypertension)    Neuropathy    nscl ca dx'd 10/2019   OSA on CPAP    PTSD  (post-traumatic stress disorder)     ALLERGIES:  is allergic to metformin and related, naproxen, and oxycontin [oxycodone].  MEDICATIONS:  Current Outpatient Medications  Medication Sig Dispense Refill   amLODipine (NORVASC) 10 MG tablet Take 1 tablet (10 mg total) by mouth daily. 90 tablet 1   Ascorbic Acid (VITAMIN C) 1000 MG tablet Take 1,000 mg by mouth daily.     aspirin EC 81 MG tablet Take 81 mg by mouth daily.     atorvastatin (LIPITOR) 10 MG tablet Take 1 tablet (10 mg total) by mouth daily. 90 tablet 1   carvedilol (COREG) 3.125 MG tablet Take 1 tablet (3.125 mg total) by mouth 2 (two) times daily with a meal. 180 tablet 0   cholecalciferol (VITAMIN D3) 25 MCG (1000 UNIT) tablet Take 1,000 Units by mouth daily.     Cyanocobalamin (VITAMIN B 12 PO) Take 1 tablet by mouth daily.     empagliflozin (JARDIANCE) 25 MG TABS tablet Take 1 tablet (25 mg total) by mouth daily before breakfast. 90 tablet 1   furosemide (LASIX) 20 MG tablet Take 1 tablet (20 mg total) by mouth daily. As needed 90 tablet 0   magic mouthwash SOLN Take 5 mLs by mouth 4 (four) times daily as needed for mouth pain. Swish and swallow or spit 240 mL 2   meclizine (ANTIVERT) 25 MG tablet TAKE ONE-HALF TABLET BY MOUTH THREE TIMES A DAY AS NEEDED  FOR DIZZINESS     nicotine (NICODERM CQ - DOSED IN MG/24 HOURS) 21 mg/24hr patch Place 1 patch (21 mg total) onto the skin daily. 30 patch 1   Propylene Glycol (SYSTANE BALANCE OP) Place 1 drop into both eyes daily.     sildenafil (VIAGRA) 100 MG tablet TAKE ONE TABLET BY MOUTH AS INSTRUCTED (TAKE 1 HOUR PRIOR TO SEXUAL ACTIVITY *DO NOT EXCEED 1 DOSE PER 24 HOUR PERIOD*) 10 tablet 5   triamterene-hydrochlorothiazide (MAXZIDE-25) 37.5-25 MG tablet Take 1 tablet by mouth daily. 90 tablet 1   zinc gluconate 50 MG tablet Take 50 mg by mouth daily.     No current facility-administered medications for this visit.    SURGICAL HISTORY:  Past Surgical History:  Procedure Laterality  Date   BIOPSY OF MEDIASTINAL MASS  12/10/2019   Procedure: BIOPSY OF MEDIASTINAL MASS;  Surgeon: Garner Nash, DO;  Location: [facility];  Service: Pulmonary;;  right upper lobe   BRONCHIAL NEEDLE ASPIRATION BIOPSY  12/10/2019   Procedure: BRONCHIAL NEEDLE ASPIRATION BIOPSIES;  Surgeon: Garner Nash, DO;  Location: [facility];  Service: Pulmonary;;   COLONOSCOPY     IR IMAGING GUIDED PORT INSERTION  12/31/2019   kidney stone     URETERAL STENT PLACEMENT     Ureteral Stent Removed     VIDEO BRONCHOSCOPY WITH ENDOBRONCHIAL ULTRASOUND N/A 12/10/2019   Procedure: VIDEO BRONCHOSCOPY WITH ENDOBRONCHIAL ULTRASOUND;  Surgeon: Garner Nash, DO;  Location: [facility];  Service: Pulmonary;  Laterality: N/A;    REVIEW OF SYSTEMS:  A comprehensive review of systems was negative except for: Constitutional: positive for fatigue   PHYSICAL EXAMINATION: General appearance: alert, cooperative, and no distress Head: Normocephalic, without obvious abnormality, atraumatic Neck: no adenopathy, no JVD, supple, symmetrical, trachea midline, and thyroid not enlarged, symmetric, no tenderness/mass/nodules Lymph nodes: Cervical, supraclavicular, and axillary nodes normal. Resp: clear to auscultation bilaterally Back: symmetric, no curvature. ROM normal. No CVA tenderness. Cardio: regular rate and rhythm, S1, S2 normal, no murmur, click, rub or gallop GI: soft, non-tender; bowel sounds normal; no masses,  no organomegaly Extremities: extremities normal, atraumatic, no cyanosis or edema  ECOG PERFORMANCE STATUS: 1 - Symptomatic but completely ambulatory  Blood pressure (!) 150/69, pulse 69, temperature (!) 96.3 F (35.7 C), temperature source Tympanic, resp. rate 20, height 5\' 9"  (1.753 m), weight 243 lb 11.2 oz (110.5 kg), SpO2 100 %.  LABORATORY DATA: Lab Results  Component Value Date   WBC 6.3 10/07/2021   HGB 13.1 10/07/2021   HCT 37.4 (L) 10/07/2021   MCV 93.7 10/07/2021   PLT 170 10/07/2021       Chemistry      Component Value Date/Time   NA 134 (L) 10/07/2021 1007   NA 138 01/08/2021 0955   K 4.6 10/07/2021 1007   CL 104 10/07/2021 1007   CO2 22 10/07/2021 1007   BUN 41 (H) 10/07/2021 1007   BUN 28 (H) 01/08/2021 0955   CREATININE 2.37 (H) 10/07/2021 1007      Component Value Date/Time   CALCIUM 10.1 10/07/2021 1007   ALKPHOS 63 10/07/2021 1007   AST 17 10/07/2021 1007   ALT 20 10/07/2021 1007   BILITOT 0.3 10/07/2021 1007       RADIOGRAPHIC STUDIES: No results found.   ASSESSMENT AND PLAN: This is a very pleasant 65 years old African-American male recently diagnosed with a stage IV non-small cell lung cancer, adenocarcinoma with no actionable mutations presented with large right lower lobe lung  mass in addition to mediastinal and right supraclavicular lymphadenopathy as well as bilateral pulmonary nodules diagnosed in February 2021. I explained to the patient that he has incurable condition and all the treatment options will be of palliative nature. The patient has no actionable mutations on the recent molecular studies and he is not a candidate for the [facility] clinical trial. He started systemic chemotherapy with carboplatin, Alimta and Keytruda status post 29 cycles.  Starting from cycle #5 the patient is on maintenance treatment with Alimta and Keytruda every 3 weeks.  He has been recently on single agent Keytruda for several cycles secondary to renal insufficiency. The patient has been tolerating this treatment well with no concerning adverse effects. I recommended for him to proceed with cycle #30 today as planned. Will make sure that the patient has his repeat CT scan of the chest, abdomen and pelvis before the upcoming visit. For the renal insufficiency and hypertension the patient will continue his routine follow-up visit by his primary care physician and nephrologist. He was advised to call immediately if he has any concerning symptoms in the  interval. The patient voices understanding of current disease status and treatment options and is in agreement with the current care plan.  All questions were answered. The patient knows to call the clinic with any problems, questions or concerns. We can certainly see the patient much sooner if necessary.  Disclaimer: This note was dictated with voice recognition software. Similar sounding words can inadvertently be transcribed and may not be corrected upon review.

## 2021-10-07 NOTE — Progress Notes (Signed)
Per Dr. Julien Nordmann, ok to treat with elevated SCR of 2.37 today.

## 2021-10-14 ENCOUNTER — Ambulatory Visit (HOSPITAL_COMMUNITY): Payer: No Typology Code available for payment source

## 2021-10-22 ENCOUNTER — Other Ambulatory Visit (HOSPITAL_COMMUNITY): Payer: No Typology Code available for payment source

## 2021-10-22 NOTE — Progress Notes (Signed)
[facility] OFFICE PROGRESS NOTE  [name], NP [place] [address]  DIAGNOSIS: Stage IV (T2b, N3, M1a) non-small cell lung cancer, adenocarcinoma presented with large right upper lobe lung mass in addition to mediastinal and right supraclavicular lymphadenopathy as well as bilateral pulmonary nodules diagnosed in February 2021.   MOLECULAR STUDY by [employer]:   KRASG12C, 4.3%, Binimetinib   [id number], 0.9%, Niraparib, Olaparib, Rucaparib,Talazoparib, Tazemetostat   [id number], 1.7%, None   PRIOR THERAPY: None  CURRENT THERAPY: Systemic chemotherapy with carboplatin for AUC of 5, Alimta 500 mg/M2 and Keytruda 200 mg IV every 3 weeks.  First dose December 31, 2019. Status post 30 cycles. Starting from cycle #5, he will be on maintenance Alimta and Keytruda. Alimta was discontinued due to renal insufficiency.  INTERVAL HISTORY: Gina Costilla. 65 y.o. male returns to the clinic today for a follow-up visit.  The patient is feeling fair today except he has had diarrhea for 1 week with associated abdominal cramping. This started prior to his restaging CT scan. He estimates that he has 3-6 loose stools daily. He has not tried to take imodium. Denies any blood in the stool. He is currently on single agent immunotherapy with Keytruda. Alimta was discontinued due to renal insufficiency.  He denies any fever, chills, or night sweats. He lost about 4 lbs since his last appointment; however, he reports a good appetite. He reports baseline dyspnea on exertion.  He denies any significant cough, chest pain, or hemoptysis.  He denies any nausea, vomiting, or constipation.  He denies any rashes or skin changes. He notes chronic low back pain and is wondering what his CT scan shows of the low back. He had a restaging CT scan a few days ago. The patient is here today for evaluation and to review his scan before starting cycle #31.  MEDICAL HISTORY: Past  Medical History:  Diagnosis Date   Arthritis    Chronic kidney disease    Depression    DM (diabetes mellitus) ([place])    type II   Dyslipidemia    Dyspnea    unable to walk much - he thinks it is from    GERD (gastroesophageal reflux disease)    Hepatitis    Hepatitis C- treated   History of kidney stones    HTN (hypertension)    Neuropathy    nscl ca dx'd 10/2019   OSA on CPAP    PTSD (post-traumatic stress disorder)     ALLERGIES:  is allergic to metformin and related, naproxen, and oxycontin [oxycodone].  MEDICATIONS:  Current Outpatient Medications  Medication Sig Dispense Refill   amLODipine (NORVASC) 10 MG tablet Take 1 tablet (10 mg total) by mouth daily. 90 tablet 1   Ascorbic Acid (VITAMIN C) 1000 MG tablet Take 1,000 mg by mouth daily.     aspirin EC 81 MG tablet Take 81 mg by mouth daily.     atorvastatin (LIPITOR) 10 MG tablet Take 1 tablet (10 mg total) by mouth daily. 90 tablet 1   carvedilol (COREG) 3.125 MG tablet Take 1 tablet (3.125 mg total) by mouth 2 (two) times daily with a meal. 180 tablet 0   cholecalciferol (VITAMIN D3) 25 MCG (1000 UNIT) tablet Take 1,000 Units by mouth daily.     Cyanocobalamin (VITAMIN B 12 PO) Take 1 tablet by mouth daily.     empagliflozin (JARDIANCE) 25 MG TABS tablet Take 1 tablet (25 mg total) by mouth  daily before breakfast. 90 tablet 1   furosemide (LASIX) 20 MG tablet Take 1 tablet (20 mg total) by mouth daily. As needed 90 tablet 0   magic mouthwash SOLN Take 5 mLs by mouth 4 (four) times daily as needed for mouth pain. Swish and swallow or spit 240 mL 2   meclizine (ANTIVERT) 25 MG tablet TAKE ONE-HALF TABLET BY MOUTH THREE TIMES A DAY AS NEEDED FOR DIZZINESS     nicotine (NICODERM CQ - DOSED IN MG/24 HOURS) 21 mg/24hr patch Place 1 patch (21 mg total) onto the skin daily. 30 patch 1   Propylene Glycol (SYSTANE BALANCE OP) Place 1 drop into both eyes daily.     sildenafil (VIAGRA) 100 MG tablet TAKE ONE TABLET BY MOUTH AS  INSTRUCTED (TAKE 1 HOUR PRIOR TO SEXUAL ACTIVITY *DO NOT EXCEED 1 DOSE PER 24 HOUR PERIOD*) 10 tablet 5   triamterene-hydrochlorothiazide (MAXZIDE-25) 37.5-25 MG tablet Take 1 tablet by mouth daily. 90 tablet 1   zinc gluconate 50 MG tablet Take 50 mg by mouth daily.     No current facility-administered medications for this visit.   Facility-Administered Medications Ordered in Other Visits  Medication Dose Route Frequency Provider Last Rate Last Admin   0.9 %  sodium chloride infusion   Intravenous Once Curt Bears, MD       pembrolizumab [facility]) 200 mg in sodium chloride 0.9 % 50 mL chemo infusion  200 mg Intravenous Once Curt Bears, MD        SURGICAL HISTORY:  Past Surgical History:  Procedure Laterality Date   BIOPSY OF MEDIASTINAL MASS  12/10/2019   Procedure: BIOPSY OF MEDIASTINAL MASS;  Surgeon: Garner Nash, DO;  Location: [facility];  Service: Pulmonary;;  right upper lobe   BRONCHIAL NEEDLE ASPIRATION BIOPSY  12/10/2019   Procedure: BRONCHIAL NEEDLE ASPIRATION BIOPSIES;  Surgeon: Garner Nash, DO;  Location: [facility];  Service: Pulmonary;;   COLONOSCOPY     IR IMAGING GUIDED PORT INSERTION  12/31/2019   kidney stone     URETERAL STENT PLACEMENT     Ureteral Stent Removed     VIDEO BRONCHOSCOPY WITH ENDOBRONCHIAL ULTRASOUND N/A 12/10/2019   Procedure: VIDEO BRONCHOSCOPY WITH ENDOBRONCHIAL ULTRASOUND;  Surgeon: Garner Nash, DO;  Location: [facility];  Service: Pulmonary;  Laterality: N/A;    REVIEW OF SYSTEMS:   Review of Systems  Constitutional: Positive for fatigue and weight loss. Negative for appetite change, chills, or fever. HENT: Negative for mouth sores, sore throat and trouble swallowing.   Eyes: Positive for red eyes/watery. Negative for eye problems and icterus. Respiratory: Positive for baseline shortness of breath with exertion. Negative for cough, hemoptysis, and wheezing.   Cardiovascular: Negative for chest pain and leg swelling.   Gastrointestinal: Positive for diarrhea and occasional abdominal cramping. Negative for constipation, nausea and vomiting.  Genitourinary: Negative for bladder incontinence, difficulty urinating, dysuria, frequency and hematuria.   Musculoskeletal: Positive for chronic low back pain. Negative for gait problem, neck pain and neck stiffness.  Skin: Negative for itching and rash.  Neurological: Negative for extremity weakness, gait problem, light-headedness and seizures.  Hematological: Negative for adenopathy. Does not bruise/bleed easily.  Psychiatric/Behavioral: Negative for confusion, depression and sleep disturbance. The patient is not nervous/anxious.     PHYSICAL EXAMINATION:  Blood pressure (!) 168/92, pulse 88, temperature (!) 97.2 F (36.2 C), resp. rate 20, weight 239 lb 3.2 oz (108.5 kg), SpO2 98 %.  ECOG PERFORMANCE STATUS: 1  Physical Exam  Constitutional: Oriented  to person, place, and time and well-developed, well-nourished, and in no distress.  HENT:  Head: Normocephalic and atraumatic.  Mouth/Throat: Oropharynx is clear and moist. No oropharyngeal exudate.  Eyes: Conjunctivae are normal. Right eye exhibits no discharge. Left eye exhibits no discharge. No scleral icterus.  Neck: Normal range of motion. Neck supple.  Cardiovascular: Normal rate, regular rhythm, normal heart sounds and intact distal pulses.   Pulmonary/Chest: Effort normal. Quiet breath sounds in all lung fields. No respiratory distress. No wheezes. No rales.  Abdominal: Soft. Bowel sounds are normal. Exhibits no distension and no mass. There is no tenderness.  Musculoskeletal: Normal range of motion. Exhibits no edema.  Lymphadenopathy:    No cervical adenopathy.  Neurological: Alert and oriented to person, place, and time. Exhibits normal muscle tone. Gait normal. Coordination normal.  Skin: Skin is warm and dry. No rash noted. Not diaphoretic. No erythema. No pallor.  Psychiatric: Mood, memory and  judgment normal.  Vitals reviewed.  LABORATORY DATA: Lab Results  Component Value Date   WBC 6.1 10/28/2021   HGB 12.7 (L) 10/28/2021   HCT 36.9 (L) 10/28/2021   MCV 93.9 10/28/2021   PLT 175 10/28/2021      Chemistry      Component Value Date/Time   NA 136 10/28/2021 0941   NA 138 01/08/2021 0955   K 4.5 10/28/2021 0941   CL 106 10/28/2021 0941   CO2 25 10/28/2021 0941   BUN 24 (H) 10/28/2021 0941   BUN 28 (H) 01/08/2021 0955   CREATININE 1.76 (H) 10/28/2021 0941      Component Value Date/Time   CALCIUM 10.2 10/28/2021 0941   ALKPHOS 51 10/28/2021 0941   AST 16 10/28/2021 0941   ALT 20 10/28/2021 0941   BILITOT 0.3 10/28/2021 0941       RADIOGRAPHIC STUDIES:  CT Abdomen Pelvis Wo Contrast  Result Date: 10/27/2021 CLINICAL DATA:  Lung cancer staging EXAM: CT CHEST, ABDOMEN AND PELVIS WITHOUT CONTRAST TECHNIQUE: Multidetector CT imaging of the chest, abdomen and pelvis was performed following the standard protocol without IV contrast. COMPARISON:  CT chest, abdomen and pelvis 08/03/2021 FINDINGS: CT CHEST FINDINGS Cardiovascular: Heart size is normal. No pericardial effusion identified. Extensive coronary artery calcifications. Main pulmonary artery is mildly dilated measuring 3.1 cm in diameter. Thoracic aorta is normal caliber with severe calcified plaques. Right-sided central venous port. Mediastinum/Nodes: No enlarged mediastinal, hilar, or axillary lymph nodes. Thyroid gland, trachea, and esophagus demonstrate no significant findings. Lungs/Pleura: Lungs are hyperinflated with early emphysematous changes similar to previous. Redemonstration of an irregular and mildly spiculated mass at the posterior aspect of the perihilar right upper lobe which abuts the major fissure and appears stable in size measuring approximately 2 x 1.5 cm in axial dimensions when measured similar to previous study. No new pulmonary nodules identified. No pleural effusion or pneumothorax. Mild  bronchial wall thickening which may be slightly improved since previous study. Musculoskeletal: Advanced degenerative changes of the thoracic spine. No suspicious bony lesions identified in the chest. CT ABDOMEN PELVIS FINDINGS Hepatobiliary: Liver is normal in size and contour with no suspicious mass identified. Gallbladder appears grossly normal. No biliary ductal dilatation identified. Pancreas: Unremarkable. No pancreatic ductal dilatation or surrounding inflammatory changes. Spleen: Normal in size without focal abnormality. Adrenals/Urinary Tract: Stable adrenal adenomas measuring up to 2.2 cm on the right. A few renal calculi identified bilaterally measuring up to 6 mm in the left lower pole. No hydronephrosis identified. Small lobulated hypodense likely renal cysts noted bilaterally. Urinary  bladder is within normal limits. Stomach/Bowel: No bowel obstruction, free air or pneumatosis. No bowel wall edema identified. Moderate amount of retained fecal material throughout the colon. Appendix is normal. Vascular/Lymphatic: Severe calcified atherosclerotic disease. No bulky lymphadenopathy identified. Reproductive: Prostate gland appears mildly enlarged. Other: Right inguinal hernia containing fat. Small umbilical hernia containing fat. No ascites. Musculoskeletal: Advanced degenerative changes of the lumbar spine. No suspicious bony lesions identified. IMPRESSION: 1. No new suspicious mass or lymphadenopathy identified in the chest, abdomen or pelvis. 2. Grossly stable size and appearance of the irregular pulmonary mass in the posterior right upper lobe abutting the fissure. 3. Additional ancillary findings as described. Electronically Signed   By: Ofilia Neas M.D.   On: 10/27/2021 11:06   CT Chest Wo Contrast  Result Date: 10/27/2021 CLINICAL DATA:  Lung cancer staging EXAM: CT CHEST, ABDOMEN AND PELVIS WITHOUT CONTRAST TECHNIQUE: Multidetector CT imaging of the chest, abdomen and pelvis was performed  following the standard protocol without IV contrast. COMPARISON:  CT chest, abdomen and pelvis 08/03/2021 FINDINGS: CT CHEST FINDINGS Cardiovascular: Heart size is normal. No pericardial effusion identified. Extensive coronary artery calcifications. Main pulmonary artery is mildly dilated measuring 3.1 cm in diameter. Thoracic aorta is normal caliber with severe calcified plaques. Right-sided central venous port. Mediastinum/Nodes: No enlarged mediastinal, hilar, or axillary lymph nodes. Thyroid gland, trachea, and esophagus demonstrate no significant findings. Lungs/Pleura: Lungs are hyperinflated with early emphysematous changes similar to previous. Redemonstration of an irregular and mildly spiculated mass at the posterior aspect of the perihilar right upper lobe which abuts the major fissure and appears stable in size measuring approximately 2 x 1.5 cm in axial dimensions when measured similar to previous study. No new pulmonary nodules identified. No pleural effusion or pneumothorax. Mild bronchial wall thickening which may be slightly improved since previous study. Musculoskeletal: Advanced degenerative changes of the thoracic spine. No suspicious bony lesions identified in the chest. CT ABDOMEN PELVIS FINDINGS Hepatobiliary: Liver is normal in size and contour with no suspicious mass identified. Gallbladder appears grossly normal. No biliary ductal dilatation identified. Pancreas: Unremarkable. No pancreatic ductal dilatation or surrounding inflammatory changes. Spleen: Normal in size without focal abnormality. Adrenals/Urinary Tract: Stable adrenal adenomas measuring up to 2.2 cm on the right. A few renal calculi identified bilaterally measuring up to 6 mm in the left lower pole. No hydronephrosis identified. Small lobulated hypodense likely renal cysts noted bilaterally. Urinary bladder is within normal limits. Stomach/Bowel: No bowel obstruction, free air or pneumatosis. No bowel wall edema identified.  Moderate amount of retained fecal material throughout the colon. Appendix is normal. Vascular/Lymphatic: Severe calcified atherosclerotic disease. No bulky lymphadenopathy identified. Reproductive: Prostate gland appears mildly enlarged. Other: Right inguinal hernia containing fat. Small umbilical hernia containing fat. No ascites. Musculoskeletal: Advanced degenerative changes of the lumbar spine. No suspicious bony lesions identified. IMPRESSION: 1. No new suspicious mass or lymphadenopathy identified in the chest, abdomen or pelvis. 2. Grossly stable size and appearance of the irregular pulmonary mass in the posterior right upper lobe abutting the fissure. 3. Additional ancillary findings as described. Electronically Signed   By: Ofilia Neas M.D.   On: 10/27/2021 11:06     ASSESSMENT/PLAN:  This is a very pleasant 65 year old African-American male diagnosed with stage IV non-small cell lung cancer, adenocarcinoma.  He presented with a large right upper lobe lung mass with right hilar, subcarinal, paratracheal lymphadenopathy, and right supraclavicular lymphadenopathy.  He also presented with bilateral pulmonary nodules.  He was diagnosed in February 2021.  He does not have any actionable mutations.     The patient is currently undergoing palliative systemic chemotherapy with carboplatin for an AUC of 5, Alimta 500 mg/m, Keytruda 200 mg IV every 3 weeks.  He is status post 30 cycles and he tolerated it well without any adverse side effects. Starting from cycle #5, the patient has been on maintenance alimta and [place].  Alimta was ultimately discontinued due to renal insufficiency.   Labs were reviewed. His creatinine is 1.76 today. He has baseline CKD. Recommend that he proceed with cycle #31 today as scheduled.   The patient recently had a restaging CT scan performed.  Dr. Julien Nordmann personally and independently reviewed the scan discussed results with the patient today.  The scan showed no  evidence for disease progression. No inflammatory changes noted in the colon. The bowels showed a moderate amount of retained stool. Due to him asking about his low back pain, reviewed he has a moderate amount of degenerative disc disease which is likely why he often has low back pain. Advised to take tylenol if needed.    We will see him back for a follow up visit in 3 weeks for evaluation and repeat blood work before starting cycle #32  Dr. Julien Nordmann encouraged the patient to use imodium for his diarrhea. If no significant improvement in his diarrhea with imodium, then we will consider further recommendations for diarrhea due to immunotherapy. Advised to stay hydrated.   The patient and I discussed the multiple health benefits of exercising today as we got on this topic. Reviewed if he has joint disease, stationary bikes or pool activities are good choices for exercise as they are easier on joints.   The patient was advised to call immediately if he has any concerning symptoms in the interval. The patient voices understanding of current disease status and treatment options and is in agreement with the current care plan. All questions were answered. The patient knows to call the clinic with any problems, questions or concerns. We can certainly see the patient much sooner if necessary  No orders of the defined types were placed in this encounter.    Rosaleen Mazer L Rayshell Goecke, PA-C 10/28/21  ADDENDUM: Hematology/Oncology Attending: I had a face-to-face encounter with the patient today.  I reviewed his record, lab, scan and recommended his care plan.  This is a very pleasant 65 years old African-American male diagnosed with a stage IV non-small cell lung cancer, adenocarcinoma and February 2021 status post systemic chemotherapy initially with carboplatin, Alimta and Keytruda followed by maintenance treatment with Alimta and Keytruda before Alimta was discontinued secondary to renal insufficiency and  the patient has been on single agent Keytruda for a total treatment of 30 cycles. Has been tolerating this treatment well with no concerning adverse effect except for few episodes of diarrhea started several days ago.  He has not taken any treatment for his diarrhea and was waiting for the visit for evaluation. He had repeat CT scan of the chest, abdomen pelvis performed recently.  I personally and independently reviewed the scan and discussed the results with the patient today. His scan showed no concerning findings for disease progression. I recommended for the patient to continue his current treatment with Keytruda every 3 weeks. For the diarrhea he was advised to take Imodium after every loose stool for few days and if there is no improvement or worsening of his diarrhea to call immediately for consideration of steroid treatment for his diarrhea. The patient was advised  to call immediately if he has any other concerning symptoms in the interval. The total time spent in the appointment was 30 minutes. Disclaimer: This note was dictated with voice recognition software. Similar sounding words can inadvertently be transcribed and may be missed upon review. Eilleen Kempf, MD 10/28/21

## 2021-10-23 ENCOUNTER — Other Ambulatory Visit: Payer: Self-pay

## 2021-10-26 ENCOUNTER — Ambulatory Visit (HOSPITAL_COMMUNITY)
Admission: RE | Admit: 2021-10-26 | Discharge: 2021-10-26 | Disposition: A | Discharge status: Home / Self Care | Payer: No Typology Code available for payment source | Source: Ambulatory Visit | Attending: Internal Medicine | Admitting: Internal Medicine

## 2021-10-26 ENCOUNTER — Other Ambulatory Visit: Payer: Self-pay

## 2021-10-26 DIAGNOSIS — C3490 Malignant neoplasm of unspecified part of unspecified bronchus or lung: Secondary | ICD-10-CM | POA: Diagnosis present

## 2021-10-28 ENCOUNTER — Other Ambulatory Visit: Payer: Self-pay

## 2021-10-28 ENCOUNTER — Inpatient Hospital Stay (HOSPITAL_BASED_OUTPATIENT_CLINIC_OR_DEPARTMENT_OTHER): Payer: No Typology Code available for payment source | Admitting: Physician Assistant

## 2021-10-28 ENCOUNTER — Inpatient Hospital Stay: Payer: No Typology Code available for payment source

## 2021-10-28 VITALS — BP 168/92 | HR 88 | Temp 97.2°F | Resp 20 | Wt 239.2 lb

## 2021-10-28 DIAGNOSIS — C3491 Malignant neoplasm of unspecified part of right bronchus or lung: Secondary | ICD-10-CM

## 2021-10-28 DIAGNOSIS — Z95828 Presence of other vascular implants and grafts: Secondary | ICD-10-CM

## 2021-10-28 DIAGNOSIS — Z5112 Encounter for antineoplastic immunotherapy: Secondary | ICD-10-CM

## 2021-10-28 LAB — CBC WITH DIFFERENTIAL (CANCER CENTER ONLY)
Abs Immature Granulocytes: 0.02 10*3/uL (ref 0.00–0.07)
Basophils Absolute: 0 10*3/uL (ref 0.0–0.1)
Basophils Relative: 1 %
Eosinophils Absolute: 0.4 10*3/uL (ref 0.0–0.5)
Eosinophils Relative: 7 %
HCT: 36.9 % — ABNORMAL LOW (ref 39.0–52.0)
Hemoglobin: 12.7 g/dL — ABNORMAL LOW (ref 13.0–17.0)
Immature Granulocytes: 0 %
Lymphocytes Relative: 27 %
Lymphs Abs: 1.6 10*3/uL (ref 0.7–4.0)
MCH: 32.3 pg (ref 26.0–34.0)
MCHC: 34.4 g/dL (ref 30.0–36.0)
MCV: 93.9 fL (ref 80.0–100.0)
Monocytes Absolute: 0.8 10*3/uL (ref 0.1–1.0)
Monocytes Relative: 13 %
Neutro Abs: 3.2 10*3/uL (ref 1.7–7.7)
Neutrophils Relative %: 52 %
Platelet Count: 175 10*3/uL (ref 150–400)
RBC: 3.93 MIL/uL — ABNORMAL LOW (ref 4.22–5.81)
RDW: 13.9 % (ref 11.5–15.5)
WBC Count: 6.1 10*3/uL (ref 4.0–10.5)
nRBC: 0 % (ref 0.0–0.2)

## 2021-10-28 LAB — CMP (CANCER CENTER ONLY)
ALT: 20 U/L (ref 0–44)
AST: 16 U/L (ref 15–41)
Albumin: 3.8 g/dL (ref 3.5–5.0)
Alkaline Phosphatase: 51 U/L (ref 38–126)
Anion gap: 5 (ref 5–15)
BUN: 24 mg/dL — ABNORMAL HIGH (ref 8–23)
CO2: 25 mmol/L (ref 22–32)
Calcium: 10.2 mg/dL (ref 8.9–10.3)
Chloride: 106 mmol/L (ref 98–111)
Creatinine: 1.76 mg/dL — ABNORMAL HIGH (ref 0.61–1.24)
GFR, Estimated: 42 mL/min — ABNORMAL LOW (ref >60)
Glucose, Bld: 119 mg/dL — ABNORMAL HIGH (ref 70–99)
Potassium: 4.5 mmol/L (ref 3.5–5.1)
Sodium: 136 mmol/L (ref 135–145)
Total Bilirubin: 0.3 mg/dL (ref 0.3–1.2)
Total Protein: 6.6 g/dL (ref 6.5–8.1)

## 2021-10-28 LAB — TSH: TSH: 0.842 u[IU]/mL (ref 0.320–4.118)

## 2021-10-28 MED ORDER — SODIUM CHLORIDE 0.9% FLUSH
10.0000 mL | Freq: Once | INTRAVENOUS | Status: AC
  Administered 2021-10-28: 10:00:00 10 mL

## 2021-10-28 MED ORDER — SODIUM CHLORIDE 0.9 % IV SOLN: Freq: Once | INTRAVENOUS | Status: DC

## 2021-10-28 MED ORDER — SODIUM CHLORIDE 0.9 % IV SOLN
200.0000 mg | Freq: Once | INTRAVENOUS | Status: AC
  Administered 2021-10-28: 11:00:00 200 mg via INTRAVENOUS
  Filled 2021-10-28: qty 8

## 2021-10-28 NOTE — Patient Instructions (Signed)
[facility]  Discharge Instructions: ?Thank you for choosing [facility] to provide your oncology and hematology care.  ? ?If you have a lab appointment with the Cancer Center, please go directly to the Cancer Center and check in at the registration area. ?  ?Wear comfortable clothing and clothing appropriate for easy access to any Portacath or PICC line.  ? ?We strive to give you quality time with your provider. You may need to reschedule your appointment if you arrive late (15 or more minutes).  Arriving late affects you and other patients whose appointments are after yours.  Also, if you miss three or more appointments without notifying the office, you may be dismissed from the clinic at the provider?s discretion.    ?  ?For prescription refill requests, have your pharmacy contact our office and allow 72 hours for refills to be completed.   ? ?Today you received the following chemotherapy and/or immunotherapy agents: Keytruda ?  ?To help prevent nausea and vomiting after your treatment, we encourage you to take your nausea medication as directed. ? ?BELOW ARE SYMPTOMS THAT SHOULD BE REPORTED IMMEDIATELY: ?*FEVER GREATER THAN 100.4 F (38 ?C) OR HIGHER ?*CHILLS OR SWEATING ?*NAUSEA AND VOMITING THAT IS NOT CONTROLLED WITH YOUR NAUSEA MEDICATION ?*UNUSUAL SHORTNESS OF BREATH ?*UNUSUAL BRUISING OR BLEEDING ?*URINARY PROBLEMS (pain or burning when urinating, or frequent urination) ?*BOWEL PROBLEMS (unusual diarrhea, constipation, pain near the anus) ?TENDERNESS IN MOUTH AND THROAT WITH OR WITHOUT PRESENCE OF ULCERS (sore throat, sores in mouth, or a toothache) ?UNUSUAL RASH, SWELLING OR PAIN  ?UNUSUAL VAGINAL DISCHARGE OR ITCHING  ? ?Items with * indicate a potential emergency and should be followed up as soon as possible or go to the Emergency Department if any problems should occur. ? ?Please show the CHEMOTHERAPY ALERT CARD or IMMUNOTHERAPY ALERT CARD at check-in to the  Emergency Department and triage nurse. ? ?Should you have questions after your visit or need to cancel or reschedule your appointment, please contact [facility]  Dept: [phone]  and follow the prompts.  Office hours are 8:00 a.m. to 4:30 p.m. Monday - Friday. Please note that voicemails left after 4:00 p.m. may not be returned until the following business day.  We are closed weekends and major holidays. You have access to a nurse at all times for urgent questions. Please call the main number to the clinic Dept: [phone] and follow the prompts. ? ? ?For any non-urgent questions, you may also contact your provider using MyChart. We now offer e-Visits for anyone 18 and older to request care online for non-urgent symptoms. For details visit [email or web address]. ?  ?Also download the MyChart app! Go to the app store, search "MyChart", open the app, select [facility], and log in with your MyChart username and password. ? ?Due to Covid, a mask is required upon entering the hospital/clinic. If you do not have a mask, one will be given to you upon arrival. For doctor visits, patients may have 1 support person aged 18 or older with them. For treatment visits, patients cannot have anyone with them due to current Covid guidelines and our immunocompromised population.  ? ?

## 2021-11-09 ENCOUNTER — Telehealth: Payer: Self-pay | Admitting: Nurse Practitioner

## 2021-11-09 NOTE — Telephone Encounter (Signed)
Called pt he is advised to try an semisolid  diet water juice soda cracker rice and chicken

## 2021-11-09 NOTE — Telephone Encounter (Signed)
Patient called and stated he has been having diarrhea for two weeks straight and he is unsure of what to do he wants to speak with you or Heather. Please advise. [phone]

## 2021-11-16 ENCOUNTER — Encounter: Payer: Self-pay | Admitting: Internal Medicine

## 2021-11-16 NOTE — Progress Notes (Signed)
[facility] OFFICE PROGRESS NOTE  Kevin Freshwater, NP [place] [address]  DIAGNOSIS: Stage IV (T2b, N3, M1a) non-small cell lung cancer, adenocarcinoma presented with large right upper lobe lung mass in addition to mediastinal and right supraclavicular lymphadenopathy as well as bilateral pulmonary nodules diagnosed in February 2021.   MOLECULAR STUDY by [employer]:   KRASG12C, 4.3%, Binimetinib   [id number], 0.9%, Niraparib, Olaparib, Rucaparib,Talazoparib, Tazemetostat   [id number], 1.7%, None   PRIOR THERAPY: None  CURRENT THERAPY: Systemic chemotherapy with carboplatin for AUC of 5, Alimta 500 mg/M2 and Keytruda 200 mg IV every 3 weeks.  First dose December 31, 2019. Status post 31 cycles. Starting from cycle #5, he will be on maintenance Alimta and Keytruda. Alimta was discontinued due to renal insufficiency.   INTERVAL HISTORY: Kevin Lambert. 66 y.o. male returns to the clinic today for a follow-up visit.  Patient reports that he is "been better" today he is currently on single agent immunotherapy with Keytruda. Alimta was discontinued due to renal insufficiency.  For the last month, the patient reports that he has diarrhea averaging 4-5 times per day.  He started taking Imodium at our recommendation at his last appointment which has not been effective and controlling his diarrhea.  He is taking this as prescribed up until the max daily dose.  Denies any blood in the stool.  He had some abdominal cramping a few weeks ago but none recently.  Denies any fevers.  His diarrhea predates his restaging CT scan which was on 10/26/2021.  The patient scans not show any inflammatory changes in his bowel.   He denies any chills or night sweats.  He did not lose any weight; on the contrary, he gained some weight.  He reports baseline dyspnea on exertion.  He denies any significant cough, chest pain, or hemoptysis.  He denies any vomiting or  constipation.  He has some episodes of nausea but does not have any antiemetics at home.  He denies any rashes or skin changes.  The patient is here today for evaluation before starting cycle #32.   MEDICAL HISTORY: Past Medical History:  Diagnosis Date   Arthritis    Chronic kidney disease    Depression    DM (diabetes mellitus) ([place])    type II   Dyslipidemia    Dyspnea    unable to walk much - he thinks it is from    GERD (gastroesophageal reflux disease)    Hepatitis    Hepatitis C- treated   History of kidney stones    HTN (hypertension)    Neuropathy    nscl ca dx'd 10/2019   OSA on CPAP    PTSD (post-traumatic stress disorder)     ALLERGIES:  is allergic to metformin and related, naproxen, and oxycontin [oxycodone].  MEDICATIONS:  Current Outpatient Medications  Medication Sig Dispense Refill   methylPREDNISolone (MEDROL DOSEPAK) 4 MG TBPK tablet Use as instructed 21 tablet 0   prochlorperazine (COMPAZINE) 10 MG tablet Take 1 tablet (10 mg total) by mouth every 6 (six) hours as needed. 30 tablet 2   amLODipine (NORVASC) 10 MG tablet Take 1 tablet (10 mg total) by mouth daily. 90 tablet 1   Ascorbic Acid (VITAMIN C) 1000 MG tablet Take 1,000 mg by mouth daily.     aspirin EC 81 MG tablet Take 81 mg by mouth daily.     atorvastatin (LIPITOR) 10 MG tablet Take 1 tablet (10  mg total) by mouth daily. 90 tablet 1   carvedilol (COREG) 3.125 MG tablet Take 1 tablet (3.125 mg total) by mouth 2 (two) times daily with a meal. 180 tablet 0   cholecalciferol (VITAMIN D3) 25 MCG (1000 UNIT) tablet Take 1,000 Units by mouth daily.     Cyanocobalamin (VITAMIN B 12 PO) Take 1 tablet by mouth daily.     empagliflozin (JARDIANCE) 25 MG TABS tablet Take 1 tablet (25 mg total) by mouth daily before breakfast. 90 tablet 1   furosemide (LASIX) 20 MG tablet Take 1 tablet (20 mg total) by mouth daily. As needed 90 tablet 0   magic mouthwash SOLN Take 5 mLs by mouth 4 (four) times daily as  needed for mouth pain. Swish and swallow or spit 240 mL 2   meclizine (ANTIVERT) 25 MG tablet TAKE ONE-HALF TABLET BY MOUTH THREE TIMES A DAY AS NEEDED FOR DIZZINESS     nicotine (NICODERM CQ - DOSED IN MG/24 HOURS) 21 mg/24hr patch Place 1 patch (21 mg total) onto the skin daily. 30 patch 1   Propylene Glycol (SYSTANE BALANCE OP) Place 1 drop into both eyes daily.     sildenafil (VIAGRA) 100 MG tablet TAKE ONE TABLET BY MOUTH AS INSTRUCTED (TAKE 1 HOUR PRIOR TO SEXUAL ACTIVITY *DO NOT EXCEED 1 DOSE PER 24 HOUR PERIOD*) 10 tablet 5   triamterene-hydrochlorothiazide (MAXZIDE-25) 37.5-25 MG tablet Take 1 tablet by mouth daily. 90 tablet 1   zinc gluconate 50 MG tablet Take 50 mg by mouth daily.     No current facility-administered medications for this visit.    SURGICAL HISTORY:  Past Surgical History:  Procedure Laterality Date   BIOPSY OF MEDIASTINAL MASS  12/10/2019   Procedure: BIOPSY OF MEDIASTINAL MASS;  Surgeon: Kevin Nash, DO;  Location: [facility];  Service: Pulmonary;;  right upper lobe   BRONCHIAL NEEDLE ASPIRATION BIOPSY  12/10/2019   Procedure: BRONCHIAL NEEDLE ASPIRATION BIOPSIES;  Surgeon: Kevin Nash, DO;  Location: [facility];  Service: Pulmonary;;   COLONOSCOPY     IR IMAGING GUIDED PORT INSERTION  12/31/2019   kidney stone     URETERAL STENT PLACEMENT     Ureteral Stent Removed     VIDEO BRONCHOSCOPY WITH ENDOBRONCHIAL ULTRASOUND N/A 12/10/2019   Procedure: VIDEO BRONCHOSCOPY WITH ENDOBRONCHIAL ULTRASOUND;  Surgeon: Kevin Nash, DO;  Location: [facility];  Service: Pulmonary;  Laterality: N/A;    REVIEW OF SYSTEMS:   Review of Systems  Constitutional: Positive for fatigue. Negative for appetite change, chills, or fever. HENT: Negative for mouth sores, sore throat and trouble swallowing.   Eyes: Positive for red eyes/watery. Negative for eye problems and icterus. Respiratory: Positive for baseline shortness of breath with exertion. Negative for cough,  hemoptysis, and wheezing.   Cardiovascular: Negative for chest pain and leg swelling.  Gastrointestinal: Positive for diarrhea and occasional abdominal cramping.  Positive for occasional nausea.  Negative for constipation and vomiting.  Genitourinary: Negative for bladder incontinence, difficulty urinating, dysuria, frequency and hematuria.   Musculoskeletal: Positive for chronic low back pain. Negative for gait problem, neck pain and neck stiffness.  Skin: Negative for itching and rash.  Neurological: Negative for extremity weakness, gait problem, light-headedness and seizures.  Hematological: Negative for adenopathy. Does not bruise/bleed easily.  Psychiatric/Behavioral: Negative for confusion, depression and sleep disturbance. The patient is not nervous/anxious.       PHYSICAL EXAMINATION:  Blood pressure (!) 170/81, pulse 69, temperature (!) 97.4 F (36.3 C), temperature source  Axillary, resp. rate 17, height 5\' 9"  (1.753 m), weight 245 lb 1.6 oz (111.2 kg), SpO2 97 %.  ECOG PERFORMANCE STATUS: 1  Physical Exam  Constitutional: Oriented to person, place, and time and well-developed, well-nourished, and in no distress.  HENT:  Head: Normocephalic and atraumatic.  Mouth/Throat: Oropharynx is clear and moist. No oropharyngeal exudate.  Eyes: Conjunctivae are normal. Right eye exhibits no discharge. Left eye exhibits no discharge. No scleral icterus.  Neck: Normal range of motion. Neck supple.  Cardiovascular: Normal rate, regular rhythm, normal heart sounds and intact distal pulses.   Pulmonary/Chest: Effort normal. Quiet breath sounds in all lung fields. No respiratory distress. No wheezes. No rales.  Abdominal: Soft. Bowel sounds are normal. Exhibits no distension and no mass. There is no tenderness.  Musculoskeletal: Normal range of motion. Exhibits no edema.  Lymphadenopathy:    No cervical adenopathy.  Neurological: Alert and oriented to person, place, and time. Exhibits normal  muscle tone. Gait normal. Coordination normal.  Skin: Skin is warm and dry. No rash noted. Not diaphoretic. No erythema. No pallor.  Psychiatric: Mood, memory and judgment normal.  Vitals reviewed.    LABORATORY DATA: Lab Results  Component Value Date   WBC 6.0 11/18/2021   HGB 12.9 (L) 11/18/2021   HCT 38.0 (L) 11/18/2021   MCV 94.3 11/18/2021   PLT 166 11/18/2021      Chemistry      Component Value Date/Time   NA 136 11/18/2021 1033   NA 138 01/08/2021 0955   K 4.2 11/18/2021 1033   CL 102 11/18/2021 1033   CO2 28 11/18/2021 1033   BUN 21 11/18/2021 1033   BUN 28 (H) 01/08/2021 0955   CREATININE 1.83 (H) 11/18/2021 1033      Component Value Date/Time   CALCIUM 9.5 11/18/2021 1033   ALKPHOS 53 11/18/2021 1033   AST 13 (L) 11/18/2021 1033   ALT 11 11/18/2021 1033   BILITOT 0.4 11/18/2021 1033       RADIOGRAPHIC STUDIES:  CT Abdomen Pelvis Wo Contrast  Result Date: 10/27/2021 CLINICAL DATA:  Lung cancer staging EXAM: CT CHEST, ABDOMEN AND PELVIS WITHOUT CONTRAST TECHNIQUE: Multidetector CT imaging of the chest, abdomen and pelvis was performed following the standard protocol without IV contrast. COMPARISON:  CT chest, abdomen and pelvis 08/03/2021 FINDINGS: CT CHEST FINDINGS Cardiovascular: Heart size is normal. No pericardial effusion identified. Extensive coronary artery calcifications. Main pulmonary artery is mildly dilated measuring 3.1 cm in diameter. Thoracic aorta is normal caliber with severe calcified plaques. Right-sided central venous port. Mediastinum/Nodes: No enlarged mediastinal, hilar, or axillary lymph nodes. Thyroid gland, trachea, and esophagus demonstrate no significant findings. Lungs/Pleura: Lungs are hyperinflated with early emphysematous changes similar to previous. Redemonstration of an irregular and mildly spiculated mass at the posterior aspect of the perihilar right upper lobe which abuts the major fissure and appears stable in size measuring  approximately 2 x 1.5 cm in axial dimensions when measured similar to previous study. No new pulmonary nodules identified. No pleural effusion or pneumothorax. Mild bronchial wall thickening which may be slightly improved since previous study. Musculoskeletal: Advanced degenerative changes of the thoracic spine. No suspicious bony lesions identified in the chest. CT ABDOMEN PELVIS FINDINGS Hepatobiliary: Liver is normal in size and contour with no suspicious mass identified. Gallbladder appears grossly normal. No biliary ductal dilatation identified. Pancreas: Unremarkable. No pancreatic ductal dilatation or surrounding inflammatory changes. Spleen: Normal in size without focal abnormality. Adrenals/Urinary Tract: Stable adrenal adenomas measuring up to 2.2  cm on the right. A few renal calculi identified bilaterally measuring up to 6 mm in the left lower pole. No hydronephrosis identified. Small lobulated hypodense likely renal cysts noted bilaterally. Urinary bladder is within normal limits. Stomach/Bowel: No bowel obstruction, free air or pneumatosis. No bowel wall edema identified. Moderate amount of retained fecal material throughout the colon. Appendix is normal. Vascular/Lymphatic: Severe calcified atherosclerotic disease. No bulky lymphadenopathy identified. Reproductive: Prostate gland appears mildly enlarged. Other: Right inguinal hernia containing fat. Small umbilical hernia containing fat. No ascites. Musculoskeletal: Advanced degenerative changes of the lumbar spine. No suspicious bony lesions identified. IMPRESSION: 1. No new suspicious mass or lymphadenopathy identified in the chest, abdomen or pelvis. 2. Grossly stable size and appearance of the irregular pulmonary mass in the posterior right upper lobe abutting the fissure. 3. Additional ancillary findings as described. Electronically Signed   By: Ofilia Neas M.D.   On: 10/27/2021 11:06   CT Chest Wo Contrast  Result Date:  10/27/2021 CLINICAL DATA:  Lung cancer staging EXAM: CT CHEST, ABDOMEN AND PELVIS WITHOUT CONTRAST TECHNIQUE: Multidetector CT imaging of the chest, abdomen and pelvis was performed following the standard protocol without IV contrast. COMPARISON:  CT chest, abdomen and pelvis 08/03/2021 FINDINGS: CT CHEST FINDINGS Cardiovascular: Heart size is normal. No pericardial effusion identified. Extensive coronary artery calcifications. Main pulmonary artery is mildly dilated measuring 3.1 cm in diameter. Thoracic aorta is normal caliber with severe calcified plaques. Right-sided central venous port. Mediastinum/Nodes: No enlarged mediastinal, hilar, or axillary lymph nodes. Thyroid gland, trachea, and esophagus demonstrate no significant findings. Lungs/Pleura: Lungs are hyperinflated with early emphysematous changes similar to previous. Redemonstration of an irregular and mildly spiculated mass at the posterior aspect of the perihilar right upper lobe which abuts the major fissure and appears stable in size measuring approximately 2 x 1.5 cm in axial dimensions when measured similar to previous study. No new pulmonary nodules identified. No pleural effusion or pneumothorax. Mild bronchial wall thickening which may be slightly improved since previous study. Musculoskeletal: Advanced degenerative changes of the thoracic spine. No suspicious bony lesions identified in the chest. CT ABDOMEN PELVIS FINDINGS Hepatobiliary: Liver is normal in size and contour with no suspicious mass identified. Gallbladder appears grossly normal. No biliary ductal dilatation identified. Pancreas: Unremarkable. No pancreatic ductal dilatation or surrounding inflammatory changes. Spleen: Normal in size without focal abnormality. Adrenals/Urinary Tract: Stable adrenal adenomas measuring up to 2.2 cm on the right. A few renal calculi identified bilaterally measuring up to 6 mm in the left lower pole. No hydronephrosis identified. Small lobulated  hypodense likely renal cysts noted bilaterally. Urinary bladder is within normal limits. Stomach/Bowel: No bowel obstruction, free air or pneumatosis. No bowel wall edema identified. Moderate amount of retained fecal material throughout the colon. Appendix is normal. Vascular/Lymphatic: Severe calcified atherosclerotic disease. No bulky lymphadenopathy identified. Reproductive: Prostate gland appears mildly enlarged. Other: Right inguinal hernia containing fat. Small umbilical hernia containing fat. No ascites. Musculoskeletal: Advanced degenerative changes of the lumbar spine. No suspicious bony lesions identified. IMPRESSION: 1. No new suspicious mass or lymphadenopathy identified in the chest, abdomen or pelvis. 2. Grossly stable size and appearance of the irregular pulmonary mass in the posterior right upper lobe abutting the fissure. 3. Additional ancillary findings as described. Electronically Signed   By: Ofilia Neas M.D.   On: 10/27/2021 11:06     ASSESSMENT/PLAN:  This is a very pleasant 66 year old African-American male diagnosed with stage IV non-small cell lung cancer, adenocarcinoma.  He presented with a  large right upper lobe lung mass with right hilar, subcarinal, paratracheal lymphadenopathy, and right supraclavicular lymphadenopathy.  He also presented with bilateral pulmonary nodules.  He was diagnosed in February 2021.  He does not have any actionable mutations.     The patient is currently undergoing palliative systemic chemotherapy with carboplatin for an AUC of 5, Alimta 500 mg/m, Keytruda 200 mg IV every 3 weeks.  He is status post 31 cycles and he tolerated it well without any adverse side effects. Starting from cycle #5, the patient has been on maintenance alimta and [place].  Alimta was ultimately discontinued due to renal insufficiency.   Labs were reviewed.  Reviewed his symptoms with Dr. Julien Nordmann today.  We will hold his treatment for today and send in a Medrol Dosepak to  his pharmacy in the event that his diarrhea is immunotherapy mediated.  We will see him back for follow-up visit in 1 week for evaluation and to reassess his symptoms.   Advised the patient that steroids can cause insomnia and to take this as early the day as possible.   We will see him back for a follow up visit in 1 weeks for evaluation and repeat blood work before considering starting starting cycle #32   The patient was advised to call immediately if he has any concerning symptoms in the interval. The patient voices understanding of current disease status and treatment options and is in agreement with the current care plan. All questions were answered. The patient knows to call the clinic with any problems, questions or concerns. We can certainly see the patient much sooner if necessary         No orders of the defined types were placed in this encounter.     The total time spent in the appointment was 30-39 minutes.   Moyinoluwa Dawe L Milon Dethloff, PA-C 11/18/21

## 2021-11-17 ENCOUNTER — Encounter: Payer: Self-pay | Admitting: Internal Medicine

## 2021-11-18 ENCOUNTER — Other Ambulatory Visit: Payer: Self-pay

## 2021-11-18 ENCOUNTER — Inpatient Hospital Stay: Payer: No Typology Code available for payment source | Attending: Internal Medicine

## 2021-11-18 ENCOUNTER — Inpatient Hospital Stay: Payer: No Typology Code available for payment source

## 2021-11-18 ENCOUNTER — Inpatient Hospital Stay (HOSPITAL_BASED_OUTPATIENT_CLINIC_OR_DEPARTMENT_OTHER): Payer: No Typology Code available for payment source | Admitting: Physician Assistant

## 2021-11-18 VITALS — BP 170/81 | HR 69 | Temp 97.4°F | Resp 17 | Ht 69.0 in | Wt 245.1 lb

## 2021-11-18 DIAGNOSIS — Z95828 Presence of other vascular implants and grafts: Secondary | ICD-10-CM

## 2021-11-18 DIAGNOSIS — C3491 Malignant neoplasm of unspecified part of right bronchus or lung: Secondary | ICD-10-CM

## 2021-11-18 DIAGNOSIS — R197 Diarrhea, unspecified: Secondary | ICD-10-CM | POA: Diagnosis not present

## 2021-11-18 DIAGNOSIS — Z452 Encounter for adjustment and management of vascular access device: Secondary | ICD-10-CM | POA: Diagnosis not present

## 2021-11-18 DIAGNOSIS — C3411 Malignant neoplasm of upper lobe, right bronchus or lung: Secondary | ICD-10-CM | POA: Diagnosis present

## 2021-11-18 DIAGNOSIS — E1122 Type 2 diabetes mellitus with diabetic chronic kidney disease: Secondary | ICD-10-CM | POA: Insufficient documentation

## 2021-11-18 DIAGNOSIS — Z79899 Other long term (current) drug therapy: Secondary | ICD-10-CM | POA: Diagnosis not present

## 2021-11-18 DIAGNOSIS — N189 Chronic kidney disease, unspecified: Secondary | ICD-10-CM | POA: Diagnosis not present

## 2021-11-18 LAB — CMP (CANCER CENTER ONLY)
ALT: 11 U/L (ref 0–44)
AST: 13 U/L — ABNORMAL LOW (ref 15–41)
Albumin: 3.7 g/dL (ref 3.5–5.0)
Alkaline Phosphatase: 53 U/L (ref 38–126)
Anion gap: 6 (ref 5–15)
BUN: 21 mg/dL (ref 8–23)
CO2: 28 mmol/L (ref 22–32)
Calcium: 9.5 mg/dL (ref 8.9–10.3)
Chloride: 102 mmol/L (ref 98–111)
Creatinine: 1.83 mg/dL — ABNORMAL HIGH (ref 0.61–1.24)
GFR, Estimated: 40 mL/min — ABNORMAL LOW (ref >60)
Glucose, Bld: 113 mg/dL — ABNORMAL HIGH (ref 70–99)
Potassium: 4.2 mmol/L (ref 3.5–5.1)
Sodium: 136 mmol/L (ref 135–145)
Total Bilirubin: 0.4 mg/dL (ref 0.3–1.2)
Total Protein: 6.3 g/dL — ABNORMAL LOW (ref 6.5–8.1)

## 2021-11-18 LAB — CBC WITH DIFFERENTIAL (CANCER CENTER ONLY)
Abs Immature Granulocytes: 0.03 10*3/uL (ref 0.00–0.07)
Basophils Absolute: 0 10*3/uL (ref 0.0–0.1)
Basophils Relative: 0 %
Eosinophils Absolute: 0.5 10*3/uL (ref 0.0–0.5)
Eosinophils Relative: 8 %
HCT: 38 % — ABNORMAL LOW (ref 39.0–52.0)
Hemoglobin: 12.9 g/dL — ABNORMAL LOW (ref 13.0–17.0)
Immature Granulocytes: 1 %
Lymphocytes Relative: 25 %
Lymphs Abs: 1.5 10*3/uL (ref 0.7–4.0)
MCH: 32 pg (ref 26.0–34.0)
MCHC: 33.9 g/dL (ref 30.0–36.0)
MCV: 94.3 fL (ref 80.0–100.0)
Monocytes Absolute: 0.7 10*3/uL (ref 0.1–1.0)
Monocytes Relative: 11 %
Neutro Abs: 3.3 10*3/uL (ref 1.7–7.7)
Neutrophils Relative %: 55 %
Platelet Count: 166 10*3/uL (ref 150–400)
RBC: 4.03 MIL/uL — ABNORMAL LOW (ref 4.22–5.81)
RDW: 14.8 % (ref 11.5–15.5)
WBC Count: 6 10*3/uL (ref 4.0–10.5)
nRBC: 0 % (ref 0.0–0.2)

## 2021-11-18 LAB — TSH: TSH: 0.708 u[IU]/mL (ref 0.320–4.118)

## 2021-11-18 MED ORDER — SODIUM CHLORIDE 0.9% FLUSH
10.0000 mL | Freq: Once | INTRAVENOUS | Status: AC
  Administered 2021-11-18: 10 mL

## 2021-11-18 MED ORDER — PROCHLORPERAZINE MALEATE 10 MG PO TABS: 10.0000 mg | ORAL_TABLET | Freq: Four times a day (QID) | ORAL | 2 refills | Status: DC | PRN

## 2021-11-18 MED ORDER — METHYLPREDNISOLONE 4 MG PO TBPK: ORAL_TABLET | ORAL | 0 refills | Status: DC

## 2021-11-19 ENCOUNTER — Other Ambulatory Visit: Payer: Self-pay | Admitting: Physician Assistant

## 2021-11-19 ENCOUNTER — Other Ambulatory Visit: Payer: Self-pay | Admitting: Medical Oncology

## 2021-11-19 ENCOUNTER — Telehealth: Payer: Self-pay | Admitting: Medical Oncology

## 2021-11-19 DIAGNOSIS — C3491 Malignant neoplasm of unspecified part of right bronchus or lung: Secondary | ICD-10-CM

## 2021-11-19 MED ORDER — METHYLPREDNISOLONE 4 MG PO TBPK: ORAL_TABLET | ORAL | 0 refills | Status: DC

## 2021-11-19 NOTE — Telephone Encounter (Signed)
Prednisone rx re-routed to [facility] per pt request -

## 2021-11-19 NOTE — Progress Notes (Signed)
Prednisone Rx re-routed

## 2021-11-22 ENCOUNTER — Other Ambulatory Visit: Payer: Self-pay | Admitting: *Deleted

## 2021-11-22 DIAGNOSIS — C3491 Malignant neoplasm of unspecified part of right bronchus or lung: Secondary | ICD-10-CM

## 2021-11-22 NOTE — Progress Notes (Signed)
[facility] OFFICE PROGRESS NOTE  [name], NP [place] [address]  DIAGNOSIS: Stage IV (T2b, N3, M1a) non-small cell lung cancer, adenocarcinoma presented with large right upper lobe lung mass in addition to mediastinal and right supraclavicular lymphadenopathy as well as bilateral pulmonary nodules diagnosed in February 2021.   MOLECULAR STUDY by [employer]:   KRASG12C, 4.3%, Binimetinib   [id number], 0.9%, Niraparib, Olaparib, Rucaparib,Talazoparib, Tazemetostat   [id number], 1.7%, None   PRIOR THERAPY: None  CURRENT THERAPY: Systemic chemotherapy with carboplatin for AUC of 5, Alimta 500 mg/M2 and Keytruda 200 mg IV every 3 weeks.  First dose December 31, 2019. Status post 31 cycles. Starting from cycle #5, he will be on maintenance Alimta and Keytruda. Alimta was discontinued due to renal insufficiency.  INTERVAL HISTORY: Kazuki Ingle. 66 y.o. male returns to the clinic today for a follow-up visit.  The patient was last seen in clinic 1 week ago.  At that point time, the patient had reported 1 month of diarrhea averaging 4-5 times per day.  He was taking Imodium which was ineffective.  He was given a Medrol Dosepak which should be helpful if this is immunotherapy mediated.  The patient states that his diarrhea is still several times a day. Just today, he had 3 loose stools. He estimates he had 5 loose stools yesterday. He took 20 mg of prednisone today. He is supposed to go to [facility] after his appointment today to drop off a stool specimen to evaluate for GI pathogen. He saw his PCP yesterday. He also is being referred to a nephrologist for his CKD.  He denies any fevers, chills, or night sweats. He lost about 3 lbs since last week. He reports his baseline dyspnea on exertion.  Denies any significant cough, chest pain, or hemoptysis.  Denies any nausea, vomiting, or constipation.  He denies any rashes or skin changes.  He denies any  blood in the stool.  He denies abdominal pain but a few weeks ago he had abdominal cramping associated with his diarrhea. He denies any headache or visual changes.  The patient is here today for evaluation and repeat blood work before considering starting cycle #32 of Keytruda.    MEDICAL HISTORY: Past Medical History:  Diagnosis Date   Arthritis    Chronic kidney disease    Depression    DM (diabetes mellitus) ([facility])    type II   Dyslipidemia    Dyspnea    unable to walk much - he thinks it is from    GERD (gastroesophageal reflux disease)    Hepatitis    Hepatitis C- treated   History of kidney stones    HTN (hypertension)    Neuropathy    nscl ca dx'd 10/2019   OSA on CPAP    PTSD (post-traumatic stress disorder)     ALLERGIES:  is allergic to metformin and related, naproxen, and oxycontin [oxycodone].  MEDICATIONS:  Current Outpatient Medications  Medication Sig Dispense Refill   omeprazole (PRILOSEC) 20 MG capsule Take 1 capsule (20 mg total) by mouth daily. 30 capsule 1   predniSONE (DELTASONE) 20 MG tablet Take 5 tablets (100 mg) daily for 1 week, followed by 4 tablets (80 mg) daily for 1 week, followed by 3 tablets daily (60 mg) daily for 1 week, followed by 2 tablets (40 mg) daily for 1 week, followed by 1 tablet (20 mg) daily for 1 week. 105 tablet 0  amLODipine (NORVASC) 10 MG tablet Take 1 tablet (10 mg total) by mouth daily. 90 tablet 1   Ascorbic Acid (VITAMIN C) 1000 MG tablet Take 1,000 mg by mouth daily.     aspirin EC 81 MG tablet Take 81 mg by mouth daily.     atorvastatin (LIPITOR) 10 MG tablet Take 1 tablet (10 mg total) by mouth daily. 90 tablet 1   carvedilol (COREG) 3.125 MG tablet Take 1 tablet (3.125 mg total) by mouth 2 (two) times daily with a meal. 180 tablet 1   cholecalciferol (VITAMIN D3) 25 MCG (1000 UNIT) tablet Take 1,000 Units by mouth daily.     Cyanocobalamin (VITAMIN B 12 PO) Take 1 tablet by mouth daily.     empagliflozin (JARDIANCE) 25  MG TABS tablet Take 1 tablet (25 mg total) by mouth daily before breakfast. 90 tablet 1   furosemide (LASIX) 20 MG tablet Take 1 tablet (20 mg total) by mouth daily. As needed 90 tablet 0   magic mouthwash SOLN Take 5 mLs by mouth 4 (four) times daily as needed for mouth pain. Swish and swallow or spit 240 mL 2   meclizine (ANTIVERT) 25 MG tablet TAKE ONE-HALF TABLET BY MOUTH THREE TIMES A DAY AS NEEDED FOR DIZZINESS 30 tablet 2   nicotine (NICODERM CQ - DOSED IN MG/24 HOURS) 21 mg/24hr patch Place 1 patch (21 mg total) onto the skin daily. 30 patch 1   prochlorperazine (COMPAZINE) 10 MG tablet Take 1 tablet (10 mg total) by mouth every 6 (six) hours as needed. 30 tablet 2   Propylene Glycol (SYSTANE BALANCE OP) Place 1 drop into both eyes daily.     sildenafil (VIAGRA) 100 MG tablet TAKE ONE TABLET BY MOUTH AS INSTRUCTED (TAKE 1 HOUR PRIOR TO SEXUAL ACTIVITY *DO NOT EXCEED 1 DOSE PER 24 HOUR PERIOD*) 10 tablet 5   triamterene-hydrochlorothiazide (MAXZIDE-25) 37.5-25 MG tablet Take 1 tablet by mouth daily. 90 tablet 1   zinc gluconate 50 MG tablet Take 50 mg by mouth daily.     No current facility-administered medications for this visit.    SURGICAL HISTORY:  Past Surgical History:  Procedure Laterality Date   BIOPSY OF MEDIASTINAL MASS  12/10/2019   Procedure: BIOPSY OF MEDIASTINAL MASS;  Surgeon: Garner Nash, DO;  Location: [facility];  Service: Pulmonary;;  right upper lobe   BRONCHIAL NEEDLE ASPIRATION BIOPSY  12/10/2019   Procedure: BRONCHIAL NEEDLE ASPIRATION BIOPSIES;  Surgeon: Garner Nash, DO;  Location: [place] [facility];  Service: Pulmonary;;   COLONOSCOPY     IR IMAGING GUIDED PORT INSERTION  12/31/2019   kidney stone     URETERAL STENT PLACEMENT     Ureteral Stent Removed     VIDEO BRONCHOSCOPY WITH ENDOBRONCHIAL ULTRASOUND N/A 12/10/2019   Procedure: VIDEO BRONCHOSCOPY WITH ENDOBRONCHIAL ULTRASOUND;  Surgeon: Garner Nash, DO;  Location: [facility];  Service: Pulmonary;   Laterality: N/A;    REVIEW OF SYSTEMS:   Constitutional: Positive for fatigue. Negative for appetite change, chills, or fever. HENT: Negative for mouth sores, sore throat and trouble swallowing.   Eyes: Positive for red eyes/watery. Negative for eye problems and icterus. Respiratory: Positive for baseline shortness of breath with exertion. Negative for cough, hemoptysis, and wheezing.   Cardiovascular: Negative for chest pain and leg swelling.  Gastrointestinal: Positive for diarrhea.  Negative for constipation, abdominal pain, nausea, and vomiting.  Genitourinary: Negative for bladder incontinence, difficulty urinating, dysuria, frequency and hematuria.   Musculoskeletal: Positive for chronic low  back pain. Negative for gait problem, neck pain and neck stiffness.  Skin: Negative for itching and rash.  Neurological: Negative for extremity weakness, gait problem, light-headedness and seizures.  Hematological: Negative for adenopathy. Does not bruise/bleed easily.  Psychiatric/Behavioral: Negative for confusion, depression and sleep disturbance. The patient is not nervous/anxious.      PHYSICAL EXAMINATION:  Blood pressure (!) 169/80, pulse 96, temperature (!) 97.2 F (36.2 C), temperature source Tympanic, resp. rate 19, height 5\' 9"  (1.753 m), weight 237 lb 8 oz (107.7 kg), SpO2 98 %.  ECOG PERFORMANCE STATUS: 1  Physical Exam  Constitutional: Oriented to person, place, and time and well-developed, well-nourished, and in no distress.  HENT:  Head: Normocephalic and atraumatic.  Mouth/Throat: Oropharynx is clear and moist. No oropharyngeal exudate.  Eyes: Conjunctivae are normal. Right eye exhibits no discharge. Left eye exhibits no discharge. No scleral icterus.  Neck: Normal range of motion. Neck supple.  Cardiovascular: Normal rate, regular rhythm, normal heart sounds and intact distal pulses.   Pulmonary/Chest: Effort normal. Quiet breath sounds in all lung fields. No respiratory  distress. No wheezes. No rales.  Abdominal: Soft. Bowel sounds are normal. Exhibits no distension and no mass. There is no tenderness.  Musculoskeletal: Normal range of motion. Exhibits no edema.  Lymphadenopathy:    No cervical adenopathy.  Neurological: Alert and oriented to person, place, and time. Exhibits normal muscle tone. Gait normal. Coordination normal.  Skin: Skin is warm and dry. No rash noted. Not diaphoretic. No erythema. No pallor.  Psychiatric: Mood, memory and judgment normal.  Vitals reviewed  LABORATORY DATA: Lab Results  Component Value Date   WBC 8.3 11/24/2021   HGB 13.5 11/24/2021   HCT 39.2 11/24/2021   MCV 94.7 11/24/2021   PLT 218 11/24/2021      Chemistry      Component Value Date/Time   NA 135 11/24/2021 1202   NA 138 01/08/2021 0955   K 4.5 11/24/2021 1202   CL 101 11/24/2021 1202   CO2 26 11/24/2021 1202   BUN 42 (H) 11/24/2021 1202   BUN 28 (H) 01/08/2021 0955   CREATININE 2.20 (H) 11/24/2021 1202      Component Value Date/Time   CALCIUM 9.9 11/24/2021 1202   ALKPHOS 45 11/24/2021 1202   AST 16 11/24/2021 1202   ALT 29 11/24/2021 1202   BILITOT 0.4 11/24/2021 1202       RADIOGRAPHIC STUDIES:  CT Abdomen Pelvis Wo Contrast  Result Date: 10/27/2021 CLINICAL DATA:  Lung cancer staging EXAM: CT CHEST, ABDOMEN AND PELVIS WITHOUT CONTRAST TECHNIQUE: Multidetector CT imaging of the chest, abdomen and pelvis was performed following the standard protocol without IV contrast. COMPARISON:  CT chest, abdomen and pelvis 08/03/2021 FINDINGS: CT CHEST FINDINGS Cardiovascular: Heart size is normal. No pericardial effusion identified. Extensive coronary artery calcifications. Main pulmonary artery is mildly dilated measuring 3.1 cm in diameter. Thoracic aorta is normal caliber with severe calcified plaques. Right-sided central venous port. Mediastinum/Nodes: No enlarged mediastinal, hilar, or axillary lymph nodes. Thyroid gland, trachea, and esophagus  demonstrate no significant findings. Lungs/Pleura: Lungs are hyperinflated with early emphysematous changes similar to previous. Redemonstration of an irregular and mildly spiculated mass at the posterior aspect of the perihilar right upper lobe which abuts the major fissure and appears stable in size measuring approximately 2 x 1.5 cm in axial dimensions when measured similar to previous study. No new pulmonary nodules identified. No pleural effusion or pneumothorax. Mild bronchial wall thickening which may be slightly improved since  previous study. Musculoskeletal: Advanced degenerative changes of the thoracic spine. No suspicious bony lesions identified in the chest. CT ABDOMEN PELVIS FINDINGS Hepatobiliary: Liver is normal in size and contour with no suspicious mass identified. Gallbladder appears grossly normal. No biliary ductal dilatation identified. Pancreas: Unremarkable. No pancreatic ductal dilatation or surrounding inflammatory changes. Spleen: Normal in size without focal abnormality. Adrenals/Urinary Tract: Stable adrenal adenomas measuring up to 2.2 cm on the right. A few renal calculi identified bilaterally measuring up to 6 mm in the left lower pole. No hydronephrosis identified. Small lobulated hypodense likely renal cysts noted bilaterally. Urinary bladder is within normal limits. Stomach/Bowel: No bowel obstruction, free air or pneumatosis. No bowel wall edema identified. Moderate amount of retained fecal material throughout the colon. Appendix is normal. Vascular/Lymphatic: Severe calcified atherosclerotic disease. No bulky lymphadenopathy identified. Reproductive: Prostate gland appears mildly enlarged. Other: Right inguinal hernia containing fat. Small umbilical hernia containing fat. No ascites. Musculoskeletal: Advanced degenerative changes of the lumbar spine. No suspicious bony lesions identified. IMPRESSION: 1. No new suspicious mass or lymphadenopathy identified in the chest, abdomen or  pelvis. 2. Grossly stable size and appearance of the irregular pulmonary mass in the posterior right upper lobe abutting the fissure. 3. Additional ancillary findings as described. Electronically Signed   By: Ofilia Neas M.D.   On: 10/27/2021 11:06   CT Chest Wo Contrast  Result Date: 10/27/2021 CLINICAL DATA:  Lung cancer staging EXAM: CT CHEST, ABDOMEN AND PELVIS WITHOUT CONTRAST TECHNIQUE: Multidetector CT imaging of the chest, abdomen and pelvis was performed following the standard protocol without IV contrast. COMPARISON:  CT chest, abdomen and pelvis 08/03/2021 FINDINGS: CT CHEST FINDINGS Cardiovascular: Heart size is normal. No pericardial effusion identified. Extensive coronary artery calcifications. Main pulmonary artery is mildly dilated measuring 3.1 cm in diameter. Thoracic aorta is normal caliber with severe calcified plaques. Right-sided central venous port. Mediastinum/Nodes: No enlarged mediastinal, hilar, or axillary lymph nodes. Thyroid gland, trachea, and esophagus demonstrate no significant findings. Lungs/Pleura: Lungs are hyperinflated with early emphysematous changes similar to previous. Redemonstration of an irregular and mildly spiculated mass at the posterior aspect of the perihilar right upper lobe which abuts the major fissure and appears stable in size measuring approximately 2 x 1.5 cm in axial dimensions when measured similar to previous study. No new pulmonary nodules identified. No pleural effusion or pneumothorax. Mild bronchial wall thickening which may be slightly improved since previous study. Musculoskeletal: Advanced degenerative changes of the thoracic spine. No suspicious bony lesions identified in the chest. CT ABDOMEN PELVIS FINDINGS Hepatobiliary: Liver is normal in size and contour with no suspicious mass identified. Gallbladder appears grossly normal. No biliary ductal dilatation identified. Pancreas: Unremarkable. No pancreatic ductal dilatation or  surrounding inflammatory changes. Spleen: Normal in size without focal abnormality. Adrenals/Urinary Tract: Stable adrenal adenomas measuring up to 2.2 cm on the right. A few renal calculi identified bilaterally measuring up to 6 mm in the left lower pole. No hydronephrosis identified. Small lobulated hypodense likely renal cysts noted bilaterally. Urinary bladder is within normal limits. Stomach/Bowel: No bowel obstruction, free air or pneumatosis. No bowel wall edema identified. Moderate amount of retained fecal material throughout the colon. Appendix is normal. Vascular/Lymphatic: Severe calcified atherosclerotic disease. No bulky lymphadenopathy identified. Reproductive: Prostate gland appears mildly enlarged. Other: Right inguinal hernia containing fat. Small umbilical hernia containing fat. No ascites. Musculoskeletal: Advanced degenerative changes of the lumbar spine. No suspicious bony lesions identified. IMPRESSION: 1. No new suspicious mass or lymphadenopathy identified in the chest, abdomen  or pelvis. 2. Grossly stable size and appearance of the irregular pulmonary mass in the posterior right upper lobe abutting the fissure. 3. Additional ancillary findings as described. Electronically Signed   By: Ofilia Neas M.D.   On: 10/27/2021 11:06     ASSESSMENT/PLAN:  This is a very pleasant 66 year old African-American male diagnosed with stage IV non-small cell lung cancer, adenocarcinoma.  He presented with a large right upper lobe lung mass with right hilar, subcarinal, paratracheal lymphadenopathy, and right supraclavicular lymphadenopathy.  He also presented with bilateral pulmonary nodules.  He was diagnosed in February 2021.  He does not have any actionable mutations.     The patient is currently undergoing palliative systemic chemotherapy with carboplatin for an AUC of 5, Alimta 500 mg/m, Keytruda 200 mg IV every 3 weeks.  He is status post 31 cycles and he tolerated it well without any  adverse side effects. Starting from cycle #5, the patient has been on maintenance alimta and [place].  Alimta was ultimately discontinued due to renal insufficiency.   The patient was seen with Dr. Julien Nordmann today.  Reviewed the patient's diarrhea with Dr. Julien Nordmann.  Dr. Julien Nordmann recommends continuing to hold on his treatment and starting the patient on a high-dose prednisone taper in the event that his diarrhea is immunotherapy mediated which Dr. Julien Nordmann discussed can happen anytime.  I have sent a prescription for Prilosec to take once a day to his pharmacy as well as a prednisone taper.  The patient knows not to take the last dose from his Medrol Dosepak prescribed last week.  Instead, the patient was instructed to take 100 mg of prednisone in the morning for 1 week, followed by 80 mg daily of prednisone for 1 week, followed by 60 mg of prednisone daily for 1 week, followed by 40 mg daily of prednisone for 1 week, followed by 20 mg of prednisone daily for 1 week.  We will see him back in 4 weeks for evaluation and further tapering instructions  The patient does have diabetes.  Encouraged the patient to monitor his blood sugar closely at home.  He will pick up a glucometer.  I also printed him a handout on signs and symptoms of hyperglycemia so he can monitor for symptoms.   The patient is going to perform a stool sample at [facility] today.  I gave him the address to [address] [facility] in [facility].  4 weeks for evaluation and consideration of resuming treatment with Keytruda.  If the patient is found to have an infectious etiology of his diarrhea from his stool sample, advised him to call us sooner so we can see him back to resume his treatment sooner.   The patient was advised to call immediately if he has any concerning symptoms in the interval. The patient voices understanding of current disease status and treatment options and is in agreement with the current care plan. All questions were  answered. The patient knows to call the clinic with any problems, questions or concerns. We can certainly see the patient much sooner if necessary  No orders of the defined types were placed in this encounter.     Rickardo Brinegar L Jaliel Deavers, PA-C 11/24/21  ADDENDUM: Hematology/Oncology Attending: I had a face-to-face encounter with the patient today.  I reviewed his record, lab and recommended his care plan.  This is a very pleasant 66 years old African-American male diagnosed with a stage IV non-small cell lung cancer, adenocarcinoma in February 2021 with no actionable mutations.  The patient is started systemic chemotherapy initially with carboplatin, Alimta and Keytruda for 4 cycles followed by maintenance treatment initially with Alimta and Keytruda followed by single agent Keytruda secondary to renal insufficiency.  He has a total of 31 cycles of treatment.  He has been tolerating his treatment fairly well but for the last 5 weeks he has been complaining of worsening diarrhea up to 5-6 times a day.  The patient has been using some Imodium with no improvement.  He was also seen by his primary care provider recently and has stool culture and test for C. difficile performed.  He was also treated recently with a Medrol Dosepak with no improvement. Is here today for evaluation before receiving his treatment with immunotherapy. I had a lengthy discussion with the patient today about his condition.  I explained to the patient that his diarrhea is likely to be immunotherapy mediated secondary to his treatment with Keytruda.  I strongly recommend for the patient to hold any additional treatment with [facility] for now. I will start the patient on high-dose prednisone to be tapered slowly over the next few weeks. The patient was advised about the hyperglycemia with this treatment and also advised with GI prophylaxis. We will see him back for follow-up visit in 4 weeks for evaluation and repeat blood  work. If he has significant improvement of his diarrhea to grade 1 or less, we may consider resuming his treatment with [facility] for the last few cycles before he completed the course of 2 years of this regimen. The patient was advised to call immediately if he has any other concerning symptoms in the interval. The total time spent in the appointment was 30 minutes. Eilleen Kempf, MD 11/24/21

## 2021-11-23 ENCOUNTER — Other Ambulatory Visit: Payer: Self-pay

## 2021-11-23 ENCOUNTER — Inpatient Hospital Stay: Payer: No Typology Code available for payment source

## 2021-11-23 ENCOUNTER — Inpatient Hospital Stay: Payer: No Typology Code available for payment source | Admitting: Internal Medicine

## 2021-11-23 ENCOUNTER — Ambulatory Visit (INDEPENDENT_AMBULATORY_CARE_PROVIDER_SITE_OTHER): Payer: No Typology Code available for payment source | Admitting: Nurse Practitioner

## 2021-11-23 ENCOUNTER — Encounter: Payer: Self-pay | Admitting: Nurse Practitioner

## 2021-11-23 VITALS — BP 164/89 | HR 84 | Temp 98.2°F | Ht 69.0 in | Wt 240.4 lb

## 2021-11-23 DIAGNOSIS — N529 Male erectile dysfunction, unspecified: Secondary | ICD-10-CM

## 2021-11-23 DIAGNOSIS — N1831 Chronic kidney disease, stage 3a: Secondary | ICD-10-CM | POA: Diagnosis not present

## 2021-11-23 DIAGNOSIS — R944 Abnormal results of kidney function studies: Secondary | ICD-10-CM | POA: Diagnosis not present

## 2021-11-23 DIAGNOSIS — I10 Essential (primary) hypertension: Secondary | ICD-10-CM

## 2021-11-23 DIAGNOSIS — R197 Diarrhea, unspecified: Secondary | ICD-10-CM | POA: Diagnosis not present

## 2021-11-23 DIAGNOSIS — E782 Mixed hyperlipidemia: Secondary | ICD-10-CM

## 2021-11-23 DIAGNOSIS — F17209 Nicotine dependence, unspecified, with unspecified nicotine-induced disorders: Secondary | ICD-10-CM | POA: Insufficient documentation

## 2021-11-23 DIAGNOSIS — E1122 Type 2 diabetes mellitus with diabetic chronic kidney disease: Secondary | ICD-10-CM | POA: Diagnosis not present

## 2021-11-23 DIAGNOSIS — Z6835 Body mass index (BMI) 35.0-35.9, adult: Secondary | ICD-10-CM

## 2021-11-23 LAB — POCT GLYCOSYLATED HEMOGLOBIN (HGB A1C): Hemoglobin A1C: 6 % — AB (ref 4.0–5.6)

## 2021-11-23 MED ORDER — SILDENAFIL CITRATE 100 MG PO TABS: ORAL_TABLET | ORAL | 5 refills | Status: DC

## 2021-11-23 MED ORDER — CARVEDILOL 3.125 MG PO TABS: 3.1250 mg | ORAL_TABLET | Freq: Two times a day (BID) | ORAL | 1 refills | Status: DC

## 2021-11-23 MED ORDER — TRIAMTERENE-HCTZ 37.5-25 MG PO TABS: 1.0000 | ORAL_TABLET | Freq: Every day | ORAL | 1 refills | Status: DC

## 2021-11-23 MED ORDER — ATORVASTATIN CALCIUM 10 MG PO TABS: 10.0000 mg | ORAL_TABLET | Freq: Every day | ORAL | 1 refills | Status: DC

## 2021-11-23 MED ORDER — AMLODIPINE BESYLATE 10 MG PO TABS: 10.0000 mg | ORAL_TABLET | Freq: Every day | ORAL | 1 refills | Status: DC

## 2021-11-23 MED ORDER — EMPAGLIFLOZIN 25 MG PO TABS: 25.0000 mg | ORAL_TABLET | Freq: Every day | ORAL | 1 refills | Status: DC

## 2021-11-23 MED ORDER — MECLIZINE HCL 25 MG PO TABS: ORAL_TABLET | ORAL | 2 refills | Status: DC

## 2021-11-23 NOTE — Progress Notes (Signed)
Established Patient Office Visit  Subjective:  Patient ID: Kevin Sieling., male    DOB: 02-22-1956  Age: 66 y.o. MRN: [id number]  CC:  Chief Complaint  Patient presents with   Follow-up   Diabetes    HPI Kevin Lambert. presents for follow up of diabetes. Blood sugars are doing very well. His HgbA1c is 6.0 today. Is out of Jardiance.  Of concern, patient's renal functions continue to be elevated. We have discussed referral to nephrology team in the past. Oncology team now recommending that he see nephrology. Will place referral today.  Patient has had diarrhea for past 3 to 4 weeks. Started a few weeks prior to christmas, after her moved his residence. States that when it first started, he had terrible intestinal cramping. As this has evolved, cramping has improved, but he stares that stools have not been normal since prior to Christmas. He has tried bland and liquid diet. He has taken OTC Imodium. These remedies have not helped.  Ordered a sleep study. Took some time to get approved. He blieves this was approved, however, he does not know where to go.   Past Medical History:  Diagnosis Date   Arthritis    Chronic kidney disease    Depression    DM (diabetes mellitus) ([place])    type II   Dyslipidemia    Dyspnea    unable to walk much - he thinks it is from    GERD (gastroesophageal reflux disease)    Hepatitis    Hepatitis C- treated   History of kidney stones    HTN (hypertension)    Neuropathy    nscl ca dx'd 10/2019   OSA on CPAP    PTSD (post-traumatic stress disorder)     Past Surgical History:  Procedure Laterality Date   BIOPSY OF MEDIASTINAL MASS  12/10/2019   Procedure: BIOPSY OF MEDIASTINAL MASS;  Surgeon: Garner Nash, DO;  Location: [facility];  Service: Pulmonary;;  right upper lobe   BRONCHIAL NEEDLE ASPIRATION BIOPSY  12/10/2019   Procedure: BRONCHIAL NEEDLE ASPIRATION BIOPSIES;  Surgeon: Garner Nash, DO;  Location: [facility];  Service:  Pulmonary;;   COLONOSCOPY     IR IMAGING GUIDED PORT INSERTION  12/31/2019   kidney stone     URETERAL STENT PLACEMENT     Ureteral Stent Removed     VIDEO BRONCHOSCOPY WITH ENDOBRONCHIAL ULTRASOUND N/A 12/10/2019   Procedure: VIDEO BRONCHOSCOPY WITH ENDOBRONCHIAL ULTRASOUND;  Surgeon: Garner Nash, DO;  Location: [facility];  Service: Pulmonary;  Laterality: N/A;    Family History  Problem Relation Age of Onset   Liver disease Mother    Diabetes Father     Social History   Socioeconomic History   Marital status: Single    Spouse name: Not on file   Number of children: Not on file   Years of education: Not on file   Highest education level: Not on file  Occupational History   Occupation: Psychologist, counselling    Employer: [id number]  Tobacco Use   Smoking status: Some Days    Packs/day: 0.60    Years: 50.00    Pack years: 30.00    Types: Cigars, Cigarettes   Smokeless tobacco: Never   Tobacco comments:    03/25/21: 1-2 Black & Milds  Vaping Use   Vaping Use: Never used  Substance and Sexual Activity   Alcohol use: Yes    Alcohol/week: 6.0 standard drinks    Types: 6 Cans of  beer per week   Drug use: Not Currently   Sexual activity: Yes    Birth control/protection: None  Other Topics Concern   Not on file  Social History Narrative   Not on file   Social Determinants of Health   Financial Resource Strain: High Risk   Difficulty of Paying Living Expenses: Very hard  Food Insecurity: Food Insecurity Present   Worried About [place] in the Last Year: Sometimes true   Ran Out of Food in the Last Year: Sometimes true  Transportation Needs: No Transportation Needs   Lack of Transportation (Medical): No   Lack of Transportation (Non-Medical): No  Physical Activity: Not on file  Stress: Stress Concern Present   Feeling of Stress : To some extent  Social Connections: Unknown   Frequency of Communication with Friends and Family: Three times a week   Frequency of  Social Gatherings with Friends and Family: Three times a week   Attends Religious Services: Never   Active Member of Clubs or Organizations: No   Attends Archivist Meetings: Never   Marital Status: Not on file  Intimate Partner Violence: Not on file    Outpatient Medications Prior to Visit  Medication Sig Dispense Refill   Ascorbic Acid (VITAMIN C) 1000 MG tablet Take 1,000 mg by mouth daily.     aspirin EC 81 MG tablet Take 81 mg by mouth daily.     cholecalciferol (VITAMIN D3) 25 MCG (1000 UNIT) tablet Take 1,000 Units by mouth daily.     Cyanocobalamin (VITAMIN B 12 PO) Take 1 tablet by mouth daily.     furosemide (LASIX) 20 MG tablet Take 1 tablet (20 mg total) by mouth daily. As needed 90 tablet 0   magic mouthwash SOLN Take 5 mLs by mouth 4 (four) times daily as needed for mouth pain. Swish and swallow or spit 240 mL 2   methylPREDNISolone (MEDROL DOSEPAK) 4 MG TBPK tablet Use as instructed 21 tablet 0   nicotine (NICODERM CQ - DOSED IN MG/24 HOURS) 21 mg/24hr patch Place 1 patch (21 mg total) onto the skin daily. 30 patch 1   prochlorperazine (COMPAZINE) 10 MG tablet Take 1 tablet (10 mg total) by mouth every 6 (six) hours as needed. 30 tablet 2   Propylene Glycol (SYSTANE BALANCE OP) Place 1 drop into both eyes daily.     zinc gluconate 50 MG tablet Take 50 mg by mouth daily.     amLODipine (NORVASC) 10 MG tablet Take 1 tablet (10 mg total) by mouth daily. 90 tablet 1   atorvastatin (LIPITOR) 10 MG tablet Take 1 tablet (10 mg total) by mouth daily. 90 tablet 1   carvedilol (COREG) 3.125 MG tablet Take 1 tablet (3.125 mg total) by mouth 2 (two) times daily with a meal. 180 tablet 0   empagliflozin (JARDIANCE) 25 MG TABS tablet Take 1 tablet (25 mg total) by mouth daily before breakfast. 90 tablet 1   meclizine (ANTIVERT) 25 MG tablet TAKE ONE-HALF TABLET BY MOUTH THREE TIMES A DAY AS NEEDED FOR DIZZINESS     sildenafil (VIAGRA) 100 MG tablet TAKE ONE TABLET BY MOUTH AS  INSTRUCTED (TAKE 1 HOUR PRIOR TO SEXUAL ACTIVITY *DO NOT EXCEED 1 DOSE PER 24 HOUR PERIOD*) 10 tablet 5   triamterene-hydrochlorothiazide (MAXZIDE-25) 37.5-25 MG tablet Take 1 tablet by mouth daily. 90 tablet 1   No facility-administered medications prior to visit.    Allergies  Allergen Reactions   Metformin And Related  Other (See Comments)    Severe muscle spasms   Naproxen     Pt states it have him off balance.   Oxycontin [Oxycodone] Hives    ROS Review of Systems  Constitutional:  Positive for fatigue. Negative for activity change, chills and fever.  HENT:  Negative for congestion, postnasal drip, rhinorrhea, sinus pressure, sinus pain, sneezing and sore throat.   Eyes: Negative.   Respiratory:  Positive for shortness of breath and wheezing. Negative for cough.   Cardiovascular:  Negative for chest pain and palpitations.       Blood pressure high today as patient has been out of blood pressure medication for a few weeks.  Gastrointestinal:  Positive for abdominal pain and diarrhea. Negative for constipation, nausea and vomiting.       Abdominal cramping.  Endocrine: Negative for cold intolerance, heat intolerance, polydipsia and polyuria.       Blood sugars doing well    Genitourinary:  Negative for dysuria, frequency and urgency.  Musculoskeletal:  Negative for back pain and myalgias.  Skin:  Negative for rash.  Allergic/Immunologic: Negative for environmental allergies.  Neurological:  Negative for dizziness, weakness and headaches.  Psychiatric/Behavioral:  The patient is not nervous/anxious.      Objective:    Physical Exam Vitals and nursing note reviewed.  Constitutional:      Appearance: Normal appearance. He is well-developed.  HENT:     Head: Normocephalic and atraumatic.     Nose: Nose normal.     Mouth/Throat:     Mouth: Mucous membranes are moist.  Eyes:     Extraocular Movements: Extraocular movements intact.     Conjunctiva/sclera: Conjunctivae  normal.     Pupils: Pupils are equal, round, and reactive to light.  Cardiovascular:     Rate and Rhythm: Normal rate and regular rhythm.     Pulses: Normal pulses.     Heart sounds: Normal heart sounds.  Pulmonary:     Effort: Pulmonary effort is normal.     Breath sounds: Normal breath sounds.  Abdominal:     General: Bowel sounds are increased.     Palpations: Abdomen is soft.     Tenderness: There is generalized abdominal tenderness.  Musculoskeletal:        General: Normal range of motion.     Cervical back: Normal range of motion and neck supple.  Lymphadenopathy:     Cervical: No cervical adenopathy.  Skin:    General: Skin is warm and dry.     Capillary Refill: Capillary refill takes less than 2 seconds.  Neurological:     General: No focal deficit present.     Mental Status: He is alert and oriented to person, place, and time.  Psychiatric:        Mood and Affect: Mood normal.        Behavior: Behavior normal.        Thought Content: Thought content normal.        Judgment: Judgment normal.   Today's Vitals   11/23/21 1112  BP: (!) 164/89  Pulse: 84  Temp: 98.2 F (36.8 C)  SpO2: 95%  Weight: 240 lb 6.4 oz (109 kg)  Height: '5\' 9"'  (1.753 m)   Body mass index is 35.5 kg/m.   Wt Readings from Last 3 Encounters:  11/23/21 240 lb 6.4 oz (109 kg)  11/18/21 245 lb 1.6 oz (111.2 kg)  10/28/21 239 lb 3.2 oz (108.5 kg)     Health Maintenance Due  Topic Date Due   Pneumonia Vaccine 75+ Years old (1 - PCV) 09/08/1962   OPHTHALMOLOGY EXAM  Never done   Hepatitis C Screening  Never done   COLONOSCOPY (Pts 45-56yr Insurance coverage will need to be confirmed)  Never done   COVID-19 Vaccine (3 - Pfizer risk series) 01/28/2021    There are no preventive care reminders to display for this patient.  Lab Results  Component Value Date   TSH 0.708 11/18/2021   Lab Results  Component Value Date   WBC 6.0 11/18/2021   HGB 12.9 (L) 11/18/2021   HCT 38.0 (L)  11/18/2021   MCV 94.3 11/18/2021   PLT 166 11/18/2021   Lab Results  Component Value Date   NA 136 11/18/2021   K 4.2 11/18/2021   CO2 28 11/18/2021   GLUCOSE 113 (H) 11/18/2021   BUN 21 11/18/2021   CREATININE 1.83 (H) 11/18/2021   BILITOT 0.4 11/18/2021   ALKPHOS 53 11/18/2021   AST 13 (L) 11/18/2021   ALT 11 11/18/2021   PROT 6.3 (L) 11/18/2021   ALBUMIN 3.7 11/18/2021   CALCIUM 9.5 11/18/2021   ANIONGAP 6 11/18/2021   EGFR 32 (L) 01/08/2021    Lab Results  Component Value Date   HGBA1C 6.0 (A) 11/23/2021      Assessment & Plan:  1. Type 2 diabetes mellitus with stage 3a chronic kidney disease, without long-term current use of insulin (HCC) Blood sugars well managed.  Hemoglobin A1c 6.0 today.  Continue medications as prescribed.  Refer out for diabetic eye exam. - POCT glycosylated hemoglobin (Hb A1C) - Ambulatory referral to Ophthalmology - empagliflozin (JARDIANCE) 25 MG TABS tablet; Take 1 tablet (25 mg total) by mouth daily before breakfast.  Dispense: 90 tablet; Refill: 1  2. Nonspecific abnormal results of kidney function study Renal functions continues to be elevated.  This may be related to diabetes.  May be related to chronic chemotherapy treatments.  Refer to nephrology for further evaluation. - Ambulatory referral to Nephrology  3. Diarrhea, unspecified type Check stool for ova and parasites, culture, and C. difficile.  We will treat as indicated. - Cdiff NAA+O+P+Stool Culture; Future - Cdiff NAA+O+P+Stool Culture  4. Primary hypertension Generally stable though patient has been without medications for a few few weeks.  Restart all blood pressure medications as previously prescribed.  Recommend patient monitor blood pressure daily at approximately the same time each day.  Keep a log and bring with him to next visit.  Goal is to have blood pressure at 140/80 or better. - amLODipine (NORVASC) 10 MG tablet; Take 1 tablet (10 mg total) by mouth daily.   Dispense: 90 tablet; Refill: 1 - carvedilol (COREG) 3.125 MG tablet; Take 1 tablet (3.125 mg total) by mouth 2 (two) times daily with a meal.  Dispense: 180 tablet; Refill: 1 - triamterene-hydrochlorothiazide (MAXZIDE-25) 37.5-25 MG tablet; Take 1 tablet by mouth daily.  Dispense: 90 tablet; Refill: 1  5. Mixed hyperlipidemia Refill atorvastatin. - atorvastatin (LIPITOR) 10 MG tablet; Take 1 tablet (10 mg total) by mouth daily.  Dispense: 90 tablet; Refill: 1  6. Erectile dysfunction, unspecified erectile dysfunction type May take Viagra as needed.  New prescription sent to pharmacy - sildenafil (VIAGRA) 100 MG tablet; TAKE ONE TABLET BY MOUTH AS INSTRUCTED (TAKE 1 HOUR PRIOR TO SEXUAL ACTIVITY *DO NOT EXCEED 1 DOSE PER 24 HOUR PERIOD*)  Dispense: 10 tablet; Refill: 5  7. Body mass index (BMI) of 35.0-35.9 in adult Encourage patient to limit calorie intake  to 2000 cal/day or less.  He should consume a low cholesterol, low-fat diet.    8. Tobacco use disorder, continuous Patient is a current tobacco smoker.  Currently trying to quit.  Problem List Items Addressed This Visit       Cardiovascular and Mediastinum   HTN (hypertension)   Relevant Medications   amLODipine (NORVASC) 10 MG tablet   atorvastatin (LIPITOR) 10 MG tablet   carvedilol (COREG) 3.125 MG tablet   sildenafil (VIAGRA) 100 MG tablet   triamterene-hydrochlorothiazide (MAXZIDE-25) 37.5-25 MG tablet     Endocrine   DM (diabetes mellitus) ([place]) - Primary   Relevant Medications   atorvastatin (LIPITOR) 10 MG tablet   empagliflozin (JARDIANCE) 25 MG TABS tablet   Other Relevant Orders   POCT glycosylated hemoglobin (Hb A1C) (Completed)   Ambulatory referral to Ophthalmology     Other   Hyperlipidemia   Relevant Medications   amLODipine (NORVASC) 10 MG tablet   atorvastatin (LIPITOR) 10 MG tablet   carvedilol (COREG) 3.125 MG tablet   sildenafil (VIAGRA) 100 MG tablet   triamterene-hydrochlorothiazide (MAXZIDE-25)  37.5-25 MG tablet   Erectile dysfunction   Relevant Medications   sildenafil (VIAGRA) 100 MG tablet   Body mass index (BMI) of 35.0-35.9 in adult   Diarrhea   Relevant Orders   Cdiff NAA+O+P+Stool Culture   Nonspecific abnormal results of kidney function study   Relevant Orders   Ambulatory referral to Nephrology   Tobacco use disorder, continuous    Meds ordered this encounter  Medications   amLODipine (NORVASC) 10 MG tablet    Sig: Take 1 tablet (10 mg total) by mouth daily.    Dispense:  90 tablet    Refill:  1    Patient states he will come by to pick up prescriptions    Order Specific Question:   Supervising Provider    Answer:   Beatrice Lecher D [[id number]]   atorvastatin (LIPITOR) 10 MG tablet    Sig: Take 1 tablet (10 mg total) by mouth daily.    Dispense:  90 tablet    Refill:  1    Patient states he will come by to pick up prescriptions    Order Specific Question:   Supervising Provider    Answer:   Beatrice Lecher D [[id number]]   carvedilol (COREG) 3.125 MG tablet    Sig: Take 1 tablet (3.125 mg total) by mouth 2 (two) times daily with a meal.    Dispense:  180 tablet    Refill:  1    Patient states he will come by to pick up prescriptions    Order Specific Question:   Supervising Provider    Answer:   Beatrice Lecher D [[id number]]   empagliflozin (JARDIANCE) 25 MG TABS tablet    Sig: Take 1 tablet (25 mg total) by mouth daily before breakfast.    Dispense:  90 tablet    Refill:  1    Patient states he will come by to pick up prescriptions    Order Specific Question:   Supervising Provider    Answer:   Hali Marry [[id number]]   meclizine (ANTIVERT) 25 MG tablet    Sig: TAKE ONE-HALF TABLET BY MOUTH THREE TIMES A DAY AS NEEDED FOR DIZZINESS    Dispense:  30 tablet    Refill:  2    Patient states he will come by to pick up prescriptions    Order Specific Question:   Supervising Provider    Answer:  METHENEY, CATHERINE D [[id number]]   sildenafil (VIAGRA) 100  MG tablet    Sig: TAKE ONE TABLET BY MOUTH AS INSTRUCTED (TAKE 1 HOUR PRIOR TO SEXUAL ACTIVITY *DO NOT EXCEED 1 DOSE PER 24 HOUR PERIOD*)    Dispense:  10 tablet    Refill:  5    Patient states he will come by to pick up prescriptions    Order Specific Question:   Supervising Provider    Answer:   Hali Marry [[id number]]   triamterene-hydrochlorothiazide (MAXZIDE-25) 37.5-25 MG tablet    Sig: Take 1 tablet by mouth daily.    Dispense:  90 tablet    Refill:  1    Patient states he will come by to pick up prescriptions    Order Specific Question:   Supervising Provider    Answer:   Beatrice Lecher D [[id number]]    Follow-up: Return in about 3 months (around 02/21/2022) for diabetes with HgbA1c check - see below .    [name], NP  This note was dictated using Systems analyst. Rapid proofreading was performed to expedite the delivery of the information. Despite proofreading, phonetic errors will occur which are common with this voice recognition software. Please take this into consideration. If there are any concerns, please contact our office.

## 2021-11-24 ENCOUNTER — Inpatient Hospital Stay: Payer: No Typology Code available for payment source

## 2021-11-24 ENCOUNTER — Inpatient Hospital Stay (HOSPITAL_BASED_OUTPATIENT_CLINIC_OR_DEPARTMENT_OTHER): Payer: No Typology Code available for payment source | Admitting: Physician Assistant

## 2021-11-24 VITALS — BP 169/80 | HR 96 | Temp 97.2°F | Resp 19 | Ht 69.0 in | Wt 237.5 lb

## 2021-11-24 DIAGNOSIS — C3491 Malignant neoplasm of unspecified part of right bronchus or lung: Secondary | ICD-10-CM

## 2021-11-24 DIAGNOSIS — R197 Diarrhea, unspecified: Secondary | ICD-10-CM

## 2021-11-24 DIAGNOSIS — Z95828 Presence of other vascular implants and grafts: Secondary | ICD-10-CM

## 2021-11-24 DIAGNOSIS — C3411 Malignant neoplasm of upper lobe, right bronchus or lung: Secondary | ICD-10-CM | POA: Diagnosis not present

## 2021-11-24 LAB — CBC WITH DIFFERENTIAL (CANCER CENTER ONLY)
Abs Immature Granulocytes: 0.07 10*3/uL (ref 0.00–0.07)
Basophils Absolute: 0 10*3/uL (ref 0.0–0.1)
Basophils Relative: 1 %
Eosinophils Absolute: 0.1 10*3/uL (ref 0.0–0.5)
Eosinophils Relative: 1 %
HCT: 39.2 % (ref 39.0–52.0)
Hemoglobin: 13.5 g/dL (ref 13.0–17.0)
Immature Granulocytes: 1 %
Lymphocytes Relative: 21 %
Lymphs Abs: 1.7 10*3/uL (ref 0.7–4.0)
MCH: 32.6 pg (ref 26.0–34.0)
MCHC: 34.4 g/dL (ref 30.0–36.0)
MCV: 94.7 fL (ref 80.0–100.0)
Monocytes Absolute: 1.2 10*3/uL — ABNORMAL HIGH (ref 0.1–1.0)
Monocytes Relative: 14 %
Neutro Abs: 5.3 10*3/uL (ref 1.7–7.7)
Neutrophils Relative %: 62 %
Platelet Count: 218 10*3/uL (ref 150–400)
RBC: 4.14 MIL/uL — ABNORMAL LOW (ref 4.22–5.81)
RDW: 15.3 % (ref 11.5–15.5)
WBC Count: 8.3 10*3/uL (ref 4.0–10.5)
nRBC: 0 % (ref 0.0–0.2)

## 2021-11-24 LAB — TSH: TSH: 0.706 u[IU]/mL (ref 0.320–4.118)

## 2021-11-24 LAB — CMP (CANCER CENTER ONLY)
ALT: 29 U/L (ref 0–44)
AST: 16 U/L (ref 15–41)
Albumin: 4.1 g/dL (ref 3.5–5.0)
Alkaline Phosphatase: 45 U/L (ref 38–126)
Anion gap: 8 (ref 5–15)
BUN: 42 mg/dL — ABNORMAL HIGH (ref 8–23)
CO2: 26 mmol/L (ref 22–32)
Calcium: 9.9 mg/dL (ref 8.9–10.3)
Chloride: 101 mmol/L (ref 98–111)
Creatinine: 2.2 mg/dL — ABNORMAL HIGH (ref 0.61–1.24)
GFR, Estimated: 32 mL/min — ABNORMAL LOW (ref >60)
Glucose, Bld: 123 mg/dL — ABNORMAL HIGH (ref 70–99)
Potassium: 4.5 mmol/L (ref 3.5–5.1)
Sodium: 135 mmol/L (ref 135–145)
Total Bilirubin: 0.4 mg/dL (ref 0.3–1.2)
Total Protein: 6.9 g/dL (ref 6.5–8.1)

## 2021-11-24 MED ORDER — PREDNISONE 20 MG PO TABS: ORAL_TABLET | ORAL | 0 refills | Status: DC

## 2021-11-24 MED ORDER — OMEPRAZOLE 20 MG PO CPDR: 20.0000 mg | DELAYED_RELEASE_CAPSULE | Freq: Every day | ORAL | 1 refills | Status: DC

## 2021-11-24 MED ORDER — SODIUM CHLORIDE 0.9% FLUSH
10.0000 mL | Freq: Once | INTRAVENOUS | Status: AC
  Administered 2021-11-24: 10 mL

## 2021-11-24 MED ORDER — HEPARIN SOD (PORK) LOCK FLUSH 100 UNIT/ML IV SOLN
500.0000 [IU] | Freq: Once | INTRAVENOUS | Status: AC
  Administered 2021-11-24: 500 [IU]

## 2021-11-24 NOTE — Patient Instructions (Addendum)
[place]  [phone] Opens soon ? 1?PM Website Directions B [facility] [place]  [phone] Opens soon ? 1?PM

## 2021-11-25 ENCOUNTER — Other Ambulatory Visit: Payer: No Typology Code available for payment source

## 2021-11-25 ENCOUNTER — Ambulatory Visit: Payer: No Typology Code available for payment source | Admitting: Physician Assistant

## 2021-11-26 ENCOUNTER — Ambulatory Visit: Payer: No Typology Code available for payment source

## 2021-11-29 ENCOUNTER — Telehealth: Payer: Self-pay | Admitting: Nurse Practitioner

## 2021-11-29 LAB — CDIFF NAA+O+P+STOOL CULTURE
E coli, Shiga toxin Assay: NEGATIVE
Toxigenic C. Difficile by PCR: NEGATIVE

## 2021-11-29 NOTE — Progress Notes (Signed)
Stool culture negative. Negative for c.diff. waiting for results of c. Diff.

## 2021-11-29 NOTE — Telephone Encounter (Signed)
Called pt he is advised of his referral that was placed

## 2021-11-29 NOTE — Progress Notes (Signed)
Please let the patient know that stool sample was normal. No unusual bacteria, including C. Diff. If loose stools continue, I may need to send him to see a GI provider.  Thanks

## 2021-11-29 NOTE — Progress Notes (Signed)
Waiting for results of ova and parasites

## 2021-11-29 NOTE — Telephone Encounter (Signed)
Katrina, can you please call this patient back at your convenience? He wants to ask you some questions. [phone]

## 2021-12-07 ENCOUNTER — Telehealth: Payer: Self-pay | Admitting: Medical Oncology

## 2021-12-07 NOTE — Telephone Encounter (Signed)
Pt requests imaging results. -Next appt is feb 16th.

## 2021-12-08 ENCOUNTER — Telehealth: Payer: Self-pay | Admitting: Nurse Practitioner

## 2021-12-08 NOTE — Telephone Encounter (Signed)
Called pt LVM to contact the office

## 2021-12-08 NOTE — Telephone Encounter (Signed)
Patient calling requesting to speak with you. Please return his call. AS, CMA

## 2021-12-08 NOTE — Telephone Encounter (Signed)
Called pt he is advised of his referral that was placed for his sleep study

## 2021-12-09 ENCOUNTER — Ambulatory Visit: Payer: No Typology Code available for payment source

## 2021-12-09 ENCOUNTER — Other Ambulatory Visit: Payer: No Typology Code available for payment source

## 2021-12-09 ENCOUNTER — Ambulatory Visit: Payer: No Typology Code available for payment source | Admitting: Internal Medicine

## 2021-12-15 ENCOUNTER — Telehealth: Payer: Self-pay | Admitting: Nurse Practitioner

## 2021-12-15 NOTE — Telephone Encounter (Signed)
[name] with [name] left a vm stating Kevin Lambert said we are having a hard time scheduling him a sleep study and he would like you to call him at [phone] to see if he can help in any way.

## 2021-12-15 NOTE — Telephone Encounter (Signed)
[place] from the [place] he said he will continued to look into the sleep study and contact [place]

## 2021-12-21 NOTE — Progress Notes (Signed)
[facility] OFFICE PROGRESS NOTE  Kevin Freshwater, NP [place] [id number]  DIAGNOSIS: Stage IV (T2b, N3, M1a) non-small cell lung cancer, adenocarcinoma presented with large right upper lobe lung mass in addition to mediastinal and right supraclavicular lymphadenopathy as well as bilateral pulmonary nodules diagnosed in February 2021.   MOLECULAR STUDY by [employer]:   KRASG12C, 4.3%, Binimetinib   [id number], 0.9%, Niraparib, Olaparib, Rucaparib,Talazoparib, Tazemetostat   [id number], 1.7%, None   PRIOR THERAPY: None  CURRENT THERAPY:  Systemic chemotherapy with carboplatin for AUC of 5, Alimta 500 mg/M2 and Keytruda 200 mg IV every 3 weeks.  First dose December 31, 2019. Status post 31 cycles. Starting from cycle #5, he will be on maintenance Alimta and Keytruda. Alimta was discontinued due to renal insufficiency.  Treatment on hold after cycle 31 due to diarrhea.    INTERVAL HISTORY: Kevin Lambert. 66 y.o. male returns to the clinic today for a follow-up visit.  The patient was last seen in the clinic on 11/24/2021, at that point time, the patient had a 1 month history of uncontrolled diarrhea averaging 4-5 times per day.  Imodium was ineffective.  He was given a Medrol Dosepak which did not help his symptoms significantly.  He was expected to drop off a stool sample through his PCP at that time which the result showed no GI pathogens.  The patient was given a high-dose prednisone taper in the event that this was immunotherapy mediated.  The patient is presently taking 40 milligrams of prednisone daily and is expected to start the 20 mg dose tomorrow.    Since his last appointment, he continues to have "mushy" stool. He states sometimes his stool looks like "chips". He had two episodes of loose stool yesterday and 3 episodes the day before. He has lost 12 lbs. No swelling. Of note, the patient has diabetes. He has not been checking his blood  sugar at home despite being discussed at his last appointment. He reports he has been urinating a lot. Denies dry mouth. He has CKD but it has been worsening lately. His PCP placed a referral to nephrology on 11/23/21 but he has not heard anything yet. The patient is a [place] patient.  The patient reports a few days ago he was having significant muscle cramps. He drank Gatorade zero and Pedialyte and his cramps have improved.    Patient denies any fever, chills, or night sweats.  He reports his baseline dyspnea on exertion.  Denies any significant cough, chest pain, or hemoptysis.  Denies any nausea, vomiting, or constipation.  Denies any rashes or skin changes.  Denies any abdominal pain or blood in the stool.  Denies any headache or visual changes.  The patient is here today for evaluation and repeat blood work.   MEDICAL HISTORY: Past Medical History:  Diagnosis Date   Arthritis    Chronic kidney disease    Depression    DM (diabetes mellitus) ([place])    type II   Dyslipidemia    Dyspnea    unable to walk much - he thinks it is from    GERD (gastroesophageal reflux disease)    Hepatitis    Hepatitis C- treated   History of kidney stones    HTN (hypertension)    Neuropathy    nscl ca dx'd 10/2019   OSA on CPAP    PTSD (post-traumatic stress disorder)     ALLERGIES:  is allergic to metformin  and related, naproxen, and oxycontin [oxycodone].  MEDICATIONS:  Current Outpatient Medications  Medication Sig Dispense Refill   amLODipine (NORVASC) 10 MG tablet Take 1 tablet (10 mg total) by mouth daily. 90 tablet 1   Ascorbic Acid (VITAMIN C) 1000 MG tablet Take 1,000 mg by mouth daily.     aspirin EC 81 MG tablet Take 81 mg by mouth daily.     atorvastatin (LIPITOR) 10 MG tablet Take 1 tablet (10 mg total) by mouth daily. 90 tablet 1   carvedilol (COREG) 3.125 MG tablet Take 1 tablet (3.125 mg total) by mouth 2 (two) times daily with a meal. 180 tablet 1   cholecalciferol (VITAMIN D3) 25 MCG  (1000 UNIT) tablet Take 1,000 Units by mouth daily.     Cyanocobalamin (VITAMIN B 12 PO) Take 1 tablet by mouth daily.     empagliflozin (JARDIANCE) 25 MG TABS tablet Take 1 tablet (25 mg total) by mouth daily before breakfast. 90 tablet 1   furosemide (LASIX) 20 MG tablet Take 1 tablet (20 mg total) by mouth daily. As needed 90 tablet 0   magic mouthwash SOLN Take 5 mLs by mouth 4 (four) times daily as needed for mouth pain. Swish and swallow or spit 240 mL 2   meclizine (ANTIVERT) 25 MG tablet TAKE ONE-HALF TABLET BY MOUTH THREE TIMES A DAY AS NEEDED FOR DIZZINESS 30 tablet 2   meclizine (ANTIVERT) 25 MG tablet TAKE ONE-HALF TABLET BY MOUTH THREE TIMES A DAY AS NEEDED FOR DIZZINESS     nicotine (NICODERM CQ - DOSED IN MG/24 HOURS) 21 mg/24hr patch Place 1 patch (21 mg total) onto the skin daily. 30 patch 1   omeprazole (PRILOSEC) 20 MG capsule Take 1 capsule (20 mg total) by mouth daily. 30 capsule 1   predniSONE (DELTASONE) 20 MG tablet Take 5 tablets (100 mg) daily for 1 week, followed by 4 tablets (80 mg) daily for 1 week, followed by 3 tablets daily (60 mg) daily for 1 week, followed by 2 tablets (40 mg) daily for 1 week, followed by 1 tablet (20 mg) daily for 1 week. 105 tablet 0   predniSONE (DELTASONE) 20 MG tablet TAKE FIVE TABLETS BY MOUTH DAILY FOR 1 WEEK, THEN FOUR TABLETS DAILY FOR 1 WEEK, THEN THREE TABLETS DAILY FOR 1 WEEK, THEN TWO TABLETS DAILY FOR 1 WEEK, THEN ONE TABLET DAILY FOR 1 WEEK.  CCNRX-ONCOLOGY FOR 1 WEEK, THEN FOUR TABLETS DAILY FOR 1 WEEK, THEN THREE TABLETS DAILY FOR 1 WEEK, THEN TWO TABLETS DAILY FOR 1 WEEK, THEN ONE TABLET DAILY FOR 1 WEEK.  CCNRX-ONCOLOGY     prochlorperazine (COMPAZINE) 10 MG tablet Take 1 tablet (10 mg total) by mouth every 6 (six) hours as needed. 30 tablet 2   prochlorperazine (COMPAZINE) 10 MG tablet Take 1 tablet by mouth every 6 (six) hours as needed.     Propylene Glycol (SYSTANE BALANCE OP) Place 1 drop into both eyes daily.     sildenafil  (VIAGRA) 100 MG tablet TAKE ONE TABLET BY MOUTH AS INSTRUCTED (TAKE 1 HOUR PRIOR TO SEXUAL ACTIVITY *DO NOT EXCEED 1 DOSE PER 24 HOUR PERIOD*) 10 tablet 5   triamterene-hydrochlorothiazide (MAXZIDE-25) 37.5-25 MG tablet Take 1 tablet by mouth daily. 90 tablet 1   zinc gluconate 50 MG tablet Take 50 mg by mouth daily.     No current facility-administered medications for this visit.    SURGICAL HISTORY:  Past Surgical History:  Procedure Laterality Date   BIOPSY OF MEDIASTINAL MASS  12/10/2019   Procedure: BIOPSY OF MEDIASTINAL MASS;  Surgeon: Garner Nash, DO;  Location: [facility];  Service: Pulmonary;;  right upper lobe   BRONCHIAL NEEDLE ASPIRATION BIOPSY  12/10/2019   Procedure: BRONCHIAL NEEDLE ASPIRATION BIOPSIES;  Surgeon: Garner Nash, DO;  Location: [facility];  Service: Pulmonary;;   COLONOSCOPY     IR IMAGING GUIDED PORT INSERTION  12/31/2019   kidney stone     URETERAL STENT PLACEMENT     Ureteral Stent Removed     VIDEO BRONCHOSCOPY WITH ENDOBRONCHIAL ULTRASOUND N/A 12/10/2019   Procedure: VIDEO BRONCHOSCOPY WITH ENDOBRONCHIAL ULTRASOUND;  Surgeon: Garner Nash, DO;  Location: [facility];  Service: Pulmonary;  Laterality: N/A;    REVIEW OF SYSTEMS:   Constitutional: Positive for fatigue. Negative for appetite change, chills, or fever. HENT: Negative for mouth sores, sore throat and trouble swallowing.   Eyes: Positive for red eyes/watery. Negative for eye problems and icterus. Respiratory: Positive for baseline shortness of breath with exertion. Negative for cough, hemoptysis, and wheezing.   Cardiovascular: Negative for chest pain and leg swelling.  Gastrointestinal: Positive for diarrhea.  Negative for constipation, abdominal pain, nausea, and vomiting.  Genitourinary: Negative for bladder incontinence, difficulty urinating, dysuria, frequency and hematuria.   Musculoskeletal: Positive for muscle cramps. Negative for gait problem, neck pain and neck stiffness.   Skin: Negative for itching and rash.  Neurological: Negative for extremity weakness, gait problem, light-headedness and seizures.  Hematological: Negative for adenopathy. Does not bruise/bleed easily.  Psychiatric/Behavioral: Negative for confusion, depression and sleep disturbance. The patient is not nervous/anxious.        PHYSICAL EXAMINATION:  Blood pressure 127/64, pulse 70, temperature (!) 96.8 F (36 C), temperature source Tympanic, resp. rate 17, weight 225 lb 8 oz (102.3 kg), SpO2 95 %.  ECOG PERFORMANCE STATUS: 1  Physical Exam  Constitutional: Oriented to person, place, and time and well-developed, well-nourished, and in no distress.  HENT:  Head: Normocephalic and atraumatic.  Mouth/Throat: Oropharynx is clear and moist. No oropharyngeal exudate.  Eyes: Conjunctivae are normal. Right eye exhibits no discharge. Left eye exhibits no discharge. No scleral icterus.  Neck: Normal range of motion. Neck supple.  Cardiovascular: Normal rate, regular rhythm, normal heart sounds and intact distal pulses.   Pulmonary/Chest: Effort normal and breath sounds normal. No respiratory distress. No wheezes. No rales.  Abdominal: Positive for loose stools. Soft. Bowel sounds are normal. Exhibits no distension and no mass. There is no tenderness.  Musculoskeletal: Normal range of motion. Exhibits no edema.  Lymphadenopathy:    No cervical adenopathy.  Neurological: Alert and oriented to person, place, and time. Exhibits normal muscle tone. Gait normal. Coordination normal.  Skin: Skin is warm and dry. No rash noted. Not diaphoretic. No erythema. No pallor.  Psychiatric: Mood, memory and judgment normal.  Vitals reviewed.  LABORATORY DATA: Lab Results  Component Value Date   WBC 8.0 12/23/2021   HGB 13.5 12/23/2021   HCT 38.6 (L) 12/23/2021   MCV 90.8 12/23/2021   PLT 147 (L) 12/23/2021      Chemistry      Component Value Date/Time   NA 125 (L) 12/23/2021 0933   NA 138 01/08/2021  0955   K 5.1 12/23/2021 0933   CL 88 (L) 12/23/2021 0933   CO2 24 12/23/2021 0933   BUN 68 (H) 12/23/2021 0933   BUN 28 (H) 01/08/2021 0955   CREATININE 2.96 (H) 12/23/2021 0933      Component Value Date/Time   CALCIUM  9.8 12/23/2021 0933   ALKPHOS 39 12/23/2021 0933   AST 11 (L) 12/23/2021 0933   ALT 31 12/23/2021 0933   BILITOT 0.4 12/23/2021 0933       RADIOGRAPHIC STUDIES:  No results found.   ASSESSMENT/PLAN:  This is a very pleasant 66 year old African-American male diagnosed with stage IV non-small cell lung cancer, adenocarcinoma.  He presented with a large right upper lobe lung mass with right hilar, subcarinal, paratracheal lymphadenopathy, and right supraclavicular lymphadenopathy.  He also presented with bilateral pulmonary nodules.  He was diagnosed in February 2021.  He does not have any actionable mutations.    The patient is currently undergoing palliative systemic chemotherapy with carboplatin for an AUC of 5, Alimta 500 mg/m, Keytruda 200 mg IV every 3 weeks.  He is status post 31 cycles and he tolerated it well without any adverse side effects. Starting from cycle #5, the patient has been on maintenance alimta and [place].  Alimta was ultimately discontinued due to renal insufficiency.   [name] has been on hold since cycle #31 due to uncontrolled diarrhea which may be immunotherapy mediated.  The patient was seen with Dr. Julien Nordmann today.  Labs were reviewed.  His CBC is unremarkable.  His CMP shows hyponatremia with a sodium of 125 and chloride of 88.  This may be contributing to his muscle cramps.  Encouraged to hydrate and drink Pedialyte.  The patient's blood sugar was elevated at 385 today which is likely due to the steroids.  Reiterated with the patient that he needs to monitor his blood sugar closely at home while on the steroid taper.  We will arrange for him to receive 10 units of insulin today and recheck his blood sugar in 1 hour to ensure improvement.   The patient reports that he ate a big meal prior to coming to his appointment today.  The patient's labs show worsening creatinine with a creatinine of 2.96 today.  We will arrange for him to receive 1 L of fluid over 2 hours today.  The patient denies any swelling.  Encouraged the patient to follow-up with the referral placed last month to nephrology.  The patient has a point of contact at the [place] to follow-up on this referral.  Speaking in referrals, we will refer the patient to gastroenterology for further evaluation regarding his loose stool.  We hoped for improvement in his diarrhea with the high-dose steroids if it was immunotherapy mediated.  However, his GI pathogen panel was also negative for infectious etiology.   In the meantime, we will continue to discontinue his treatment with immunotherapy with [facility] for now.  We will arrange for restaging CT scan of the chest, abdomen, and pelvis without IV contrast (due to his renal insufficiency) in 2 months.  We will see him back for follow-up visit at that time for evaluation to review his scan results and for more detailed discussion about his current condition and recommended treatment options.  The patient was advised to call immediately if he has any concerning symptoms in the interval. The patient voices understanding of current disease status and treatment options and is in agreement with the current care plan. All questions were answered. The patient knows to call the clinic with any problems, questions or concerns. We can certainly see the patient much sooner if necessary         Orders Placed This Encounter  Procedures   CT Chest Wo Contrast    Standing Status:   Future  Standing Expiration Date:   12/23/2022    Order Specific Question:   Preferred imaging location?    Answer:   [facility]   CT Abdomen Pelvis Wo Contrast    Standing Status:   Future    Standing Expiration Date:   12/23/2022    Order Specific  Question:   Preferred imaging location?    Answer:   [facility]    Order Specific Question:   Is Oral Contrast requested for this exam?    Answer:   Yes, Per Radiology protocol   Ambulatory referral to Gastroenterology    Referral Priority:   Routine    Referral Type:   Consultation    Referral Reason:   Specialty Services Required    Number of Visits Requested:   [place], PA-C 12/23/21  ADDENDUM: Hematology/Oncology Attending: I had a face-to-face encounter with the patient today.  I reviewed his record, lab and recommended his care plan.  This is a very pleasant 66 years old African-American male diagnosed with a stage IV non-small cell lung cancer, adenocarcinoma and February 2021 status post induction systemic chemotherapy with carboplatin, Alimta and Keytruda for 4 cycles followed by maintenance treatment with Alimta and Keytruda initially but most recently has been on single agent Keytruda because of renal insufficiency status post a total treatment of 31 cycles. Several weeks ago the patient started having significant diarrhea up to 10 times a day.  His treatment was [name] was discontinued and the patient is started a tapered dose of prednisone over the last few weeks.  His diarrhea has improved but still have few episodes every now and then.  He was seen by his primary care physician and had stool culture performed that was unremarkable. His renal insufficiency is getting worse.  He is followed by nephrology. We will arrange for the patient to receive 1 L of normal saline in the clinic today. We will also refer the patient to gastroenterology for evaluation of his persistent diarrhea. The patient will continue on observation with no additional treatment with [facility] for now.  He will have repeat CT scan of the chest, abdomen and pelvis without contrast in around 2 months for restaging of his disease. He was advised to call immediately if he has  any other concerning symptoms in the interval. The total time spent in the appointment was 30 minutes. Disclaimer: This note was dictated with voice recognition software. Similar sounding words can inadvertently be transcribed and may be missed upon review. Eilleen Kempf, MD 12/26/21

## 2021-12-22 ENCOUNTER — Encounter: Payer: Self-pay | Admitting: Nurse Practitioner

## 2021-12-22 ENCOUNTER — Other Ambulatory Visit: Payer: Self-pay

## 2021-12-22 ENCOUNTER — Ambulatory Visit (INDEPENDENT_AMBULATORY_CARE_PROVIDER_SITE_OTHER): Payer: No Typology Code available for payment source | Admitting: Nurse Practitioner

## 2021-12-22 VITALS — Ht 69.0 in | Wt 237.0 lb

## 2021-12-22 DIAGNOSIS — R197 Diarrhea, unspecified: Secondary | ICD-10-CM

## 2021-12-22 DIAGNOSIS — R252 Cramp and spasm: Secondary | ICD-10-CM | POA: Insufficient documentation

## 2021-12-22 DIAGNOSIS — E860 Dehydration: Secondary | ICD-10-CM | POA: Insufficient documentation

## 2021-12-22 NOTE — Progress Notes (Signed)
Virtual Visit via Telephone Note  I connected with Kevin Lambert. on 12/22/21 at  9:30 AM EST by telephone and verified that I am speaking with the correct person using two identifiers.  Location: Patient: home Provider: [employer] primary care at [facility]     I discussed the limitations, risks, security and privacy concerns of performing an evaluation and management service by telephone and the availability of in person appointments. I also discussed with the patient that there may be a patient responsible charge related to this service. The patient expressed understanding and agreed to proceed.   History of Present Illness: The patient states that he is having severe muscle spasms in his hands, wrists, arms, and feet. This has been happening for about 3 weeks. Mostly this is at night. States that he had this all day and night on Monday and then has not had the symptoms since then. The patient is lung cancer patient. Generally gets infusions every three weeks. He has not had an infusion in over six weeks due to persistent diarrhea. He does go tomorrow for labs and to see his oncologist. Stool studies were done in January and all were negative. He states that he does not take anything like tylenol to help the pain in the muscles and joints.    Observations/Objective: The patient is alert and oriented. He is pleasant and answering all questions appropriately. Breathing is non-labored. He is in no acute distress.   Today's Vitals   12/22/21 0933  Weight: 237 lb (107.5 kg)  Height: 5\' 9"  (1.753 m)   Body mass index is 35 kg/m.   Assessment and Plan: 1. Dehydration Dehydration, secondary to persistent diarrhea, is potentially causing the muscle cramps in hands and feet. Recommend increased intake of water. Also recommended er drink sugar free Gatorade or Pedialyte to help improve electrolytes. The patient will see his oncologist tomorrow and labs will be drawn. Will advise patient of  results when they are available.   2. Diarrhea, unspecified type Stool studies in January were negative for infectious sources of diarrhea. Oncology holding infusions for lung cancer to see if diarrhea improves. He has follow up appointment with oncology tomorrow. Patient may need referral to GI.  3. Muscle cramps Cramping of hands and feet are likely due to dehydration caused by persistent diarrhea.  Recommend increased intake of water. Also recommended er drink sugar free Gatorade or Pedialyte to help improve electrolytes. The patient will see his oncologist tomorrow and labs will be drawn. Will advise patient of results when they are available.   Follow Up Instructions:    I discussed the assessment and treatment plan with the patient. The patient was provided an opportunity to ask questions and all were answered. The patient agreed with the plan and demonstrated an understanding of the instructions.   The patient was advised to call back or seek an in-person evaluation if the symptoms worsen or if the condition fails to improve as anticipated.  I provided 10 minutes of non-face-to-face time during this encounter.   [name], NP

## 2021-12-23 ENCOUNTER — Other Ambulatory Visit: Payer: Self-pay

## 2021-12-23 ENCOUNTER — Encounter: Payer: Self-pay | Admitting: Physician Assistant

## 2021-12-23 ENCOUNTER — Inpatient Hospital Stay: Payer: No Typology Code available for payment source | Attending: Internal Medicine

## 2021-12-23 ENCOUNTER — Inpatient Hospital Stay (HOSPITAL_BASED_OUTPATIENT_CLINIC_OR_DEPARTMENT_OTHER): Payer: No Typology Code available for payment source | Admitting: Physician Assistant

## 2021-12-23 ENCOUNTER — Inpatient Hospital Stay: Payer: No Typology Code available for payment source

## 2021-12-23 VITALS — BP 127/64 | HR 70 | Temp 96.8°F | Resp 17 | Wt 225.5 lb

## 2021-12-23 DIAGNOSIS — Z7982 Long term (current) use of aspirin: Secondary | ICD-10-CM | POA: Insufficient documentation

## 2021-12-23 DIAGNOSIS — C3491 Malignant neoplasm of unspecified part of right bronchus or lung: Secondary | ICD-10-CM

## 2021-12-23 DIAGNOSIS — Z95828 Presence of other vascular implants and grafts: Secondary | ICD-10-CM

## 2021-12-23 DIAGNOSIS — C3490 Malignant neoplasm of unspecified part of unspecified bronchus or lung: Secondary | ICD-10-CM

## 2021-12-23 DIAGNOSIS — N289 Disorder of kidney and ureter, unspecified: Secondary | ICD-10-CM | POA: Diagnosis not present

## 2021-12-23 DIAGNOSIS — Z7952 Long term (current) use of systemic steroids: Secondary | ICD-10-CM | POA: Insufficient documentation

## 2021-12-23 DIAGNOSIS — R252 Cramp and spasm: Secondary | ICD-10-CM

## 2021-12-23 DIAGNOSIS — E871 Hypo-osmolality and hyponatremia: Secondary | ICD-10-CM | POA: Diagnosis not present

## 2021-12-23 DIAGNOSIS — C3411 Malignant neoplasm of upper lobe, right bronchus or lung: Secondary | ICD-10-CM | POA: Diagnosis present

## 2021-12-23 DIAGNOSIS — Z79899 Other long term (current) drug therapy: Secondary | ICD-10-CM | POA: Diagnosis not present

## 2021-12-23 DIAGNOSIS — Z7984 Long term (current) use of oral hypoglycemic drugs: Secondary | ICD-10-CM | POA: Diagnosis not present

## 2021-12-23 DIAGNOSIS — R197 Diarrhea, unspecified: Secondary | ICD-10-CM

## 2021-12-23 DIAGNOSIS — E1165 Type 2 diabetes mellitus with hyperglycemia: Secondary | ICD-10-CM | POA: Insufficient documentation

## 2021-12-23 LAB — CBC WITH DIFFERENTIAL (CANCER CENTER ONLY)
Abs Immature Granulocytes: 0.08 10*3/uL — ABNORMAL HIGH (ref 0.00–0.07)
Basophils Absolute: 0 10*3/uL (ref 0.0–0.1)
Basophils Relative: 0 %
Eosinophils Absolute: 0.1 10*3/uL (ref 0.0–0.5)
Eosinophils Relative: 1 %
HCT: 38.6 % — ABNORMAL LOW (ref 39.0–52.0)
Hemoglobin: 13.5 g/dL (ref 13.0–17.0)
Immature Granulocytes: 1 %
Lymphocytes Relative: 9 %
Lymphs Abs: 0.7 10*3/uL (ref 0.7–4.0)
MCH: 31.8 pg (ref 26.0–34.0)
MCHC: 35 g/dL (ref 30.0–36.0)
MCV: 90.8 fL (ref 80.0–100.0)
Monocytes Absolute: 0.1 10*3/uL (ref 0.1–1.0)
Monocytes Relative: 2 %
Neutro Abs: 7 10*3/uL (ref 1.7–7.7)
Neutrophils Relative %: 87 %
Platelet Count: 147 10*3/uL — ABNORMAL LOW (ref 150–400)
RBC: 4.25 MIL/uL (ref 4.22–5.81)
RDW: 14.2 % (ref 11.5–15.5)
WBC Count: 8 10*3/uL (ref 4.0–10.5)
nRBC: 0 % (ref 0.0–0.2)

## 2021-12-23 LAB — CMP (CANCER CENTER ONLY)
ALT: 31 U/L (ref 0–44)
AST: 11 U/L — ABNORMAL LOW (ref 15–41)
Albumin: 4.1 g/dL (ref 3.5–5.0)
Alkaline Phosphatase: 39 U/L (ref 38–126)
Anion gap: 13 (ref 5–15)
BUN: 68 mg/dL — ABNORMAL HIGH (ref 8–23)
CO2: 24 mmol/L (ref 22–32)
Calcium: 9.8 mg/dL (ref 8.9–10.3)
Chloride: 88 mmol/L — ABNORMAL LOW (ref 98–111)
Creatinine: 2.96 mg/dL — ABNORMAL HIGH (ref 0.61–1.24)
GFR, Estimated: 23 mL/min — ABNORMAL LOW (ref >60)
Glucose, Bld: 385 mg/dL — ABNORMAL HIGH (ref 70–99)
Potassium: 5.1 mmol/L (ref 3.5–5.1)
Sodium: 125 mmol/L — ABNORMAL LOW (ref 135–145)
Total Bilirubin: 0.4 mg/dL (ref 0.3–1.2)
Total Protein: 6.7 g/dL (ref 6.5–8.1)

## 2021-12-23 LAB — TSH: TSH: 0.384 u[IU]/mL (ref 0.320–4.118)

## 2021-12-23 MED ORDER — SODIUM CHLORIDE 0.9% FLUSH
10.0000 mL | Freq: Once | INTRAVENOUS | Status: AC
  Administered 2021-12-23: 10 mL via INTRAVENOUS

## 2021-12-23 MED ORDER — INSULIN ASPART 100 UNIT/ML IJ SOLN
10.0000 [IU] | Freq: Once | INTRAMUSCULAR | Status: AC
  Administered 2021-12-23: 10 [IU] via SUBCUTANEOUS
  Filled 2021-12-23: qty 1

## 2021-12-23 MED ORDER — HEPARIN SOD (PORK) LOCK FLUSH 100 UNIT/ML IV SOLN
500.0000 [IU] | Freq: Once | INTRAVENOUS | Status: AC
  Administered 2021-12-23: 500 [IU] via INTRAVENOUS

## 2021-12-23 MED ORDER — SODIUM CHLORIDE 0.9 % IV SOLN: Freq: Once | INTRAVENOUS | Status: AC

## 2021-12-23 MED ORDER — SODIUM CHLORIDE 0.9% FLUSH
10.0000 mL | Freq: Once | INTRAVENOUS | Status: AC
  Administered 2021-12-23: 10 mL

## 2021-12-23 NOTE — Patient Instructions (Signed)

## 2021-12-23 NOTE — Progress Notes (Signed)
Pt came in with elevated blood sugar, pt was given 10 Units insulin (see MAR) cbg rechecked at 1235pm and CBG was 416. Verified by this RN and Almira Bar, RN. Cassie Heilingoepter, PA notified. Pt educated on home cbg checks. Verbalized understanding. Agreeable to call with questions or concerns.

## 2021-12-24 ENCOUNTER — Telehealth: Payer: Self-pay | Admitting: Nurse Practitioner

## 2021-12-24 LAB — GLUCOSE, CAPILLARY: Glucose-Capillary: 450 mg/dL — ABNORMAL HIGH (ref 70–99)

## 2021-12-24 NOTE — Telephone Encounter (Signed)
Today, I spoke with Kevin Lambert over the phone. Reviewed labs that were taken at his visit with oncology. Sodium and chloride levels were very low, likely contributing to the muscle cramps he was experiencing. He states that he has been drinking pedialyte periodically and has had no further episodes of muscle cramping. Blood sugar was also elevated. This may be related to steroid taper given per oncologist to help with diarrhea. Kevin Lambert is being stopped for now. Oncology plans to rescan chest, abdmen, and pelvis in two months. In the meantime, he has been referred to GI due to persistent diarrhea and nephrology due to worsening renal functions. Patient voiced understanding and agreement with the current plans.

## 2021-12-26 ENCOUNTER — Encounter: Payer: Self-pay | Admitting: Internal Medicine

## 2021-12-30 ENCOUNTER — Ambulatory Visit: Payer: No Typology Code available for payment source

## 2021-12-30 ENCOUNTER — Other Ambulatory Visit: Payer: No Typology Code available for payment source

## 2021-12-30 ENCOUNTER — Ambulatory Visit: Payer: No Typology Code available for payment source | Admitting: Physician Assistant

## 2022-01-07 ENCOUNTER — Encounter (HOSPITAL_COMMUNITY): Payer: Self-pay

## 2022-01-07 ENCOUNTER — Ambulatory Visit (HOSPITAL_COMMUNITY)
Admission: EM | Admit: 2022-01-07 | Discharge: 2022-01-07 | Payer: No Typology Code available for payment source | Attending: Family Medicine | Admitting: Family Medicine

## 2022-01-07 ENCOUNTER — Other Ambulatory Visit: Payer: Self-pay

## 2022-01-07 ENCOUNTER — Ambulatory Visit (INDEPENDENT_AMBULATORY_CARE_PROVIDER_SITE_OTHER): Payer: No Typology Code available for payment source

## 2022-01-07 DIAGNOSIS — E1165 Type 2 diabetes mellitus with hyperglycemia: Secondary | ICD-10-CM

## 2022-01-07 DIAGNOSIS — E1122 Type 2 diabetes mellitus with diabetic chronic kidney disease: Secondary | ICD-10-CM | POA: Diagnosis not present

## 2022-01-07 DIAGNOSIS — N184 Chronic kidney disease, stage 4 (severe): Secondary | ICD-10-CM | POA: Diagnosis not present

## 2022-01-07 DIAGNOSIS — E1169 Type 2 diabetes mellitus with other specified complication: Secondary | ICD-10-CM

## 2022-01-07 DIAGNOSIS — G8929 Other chronic pain: Secondary | ICD-10-CM

## 2022-01-07 DIAGNOSIS — R8290 Unspecified abnormal findings in urine: Secondary | ICD-10-CM

## 2022-01-07 DIAGNOSIS — R350 Frequency of micturition: Secondary | ICD-10-CM

## 2022-01-07 DIAGNOSIS — M549 Dorsalgia, unspecified: Secondary | ICD-10-CM | POA: Diagnosis not present

## 2022-01-07 DIAGNOSIS — R109 Unspecified abdominal pain: Secondary | ICD-10-CM | POA: Diagnosis not present

## 2022-01-07 DIAGNOSIS — R739 Hyperglycemia, unspecified: Secondary | ICD-10-CM

## 2022-01-07 LAB — POCT URINALYSIS DIPSTICK, ED / UC
Bilirubin Urine: NEGATIVE
Glucose, UA: >=1000 mg/dL — AB
Ketones, ur: NEGATIVE mg/dL
Leukocytes,Ua: NEGATIVE
Nitrite: NEGATIVE
Protein, ur: NEGATIVE mg/dL
Specific Gravity, Urine: <=1.005 (ref 1.005–1.030)
Urobilinogen, UA: 0.2 mg/dL (ref 0.0–1.0)
pH: 6 (ref 5.0–8.0)

## 2022-01-07 LAB — CBG MONITORING, ED: Glucose-Capillary: >600 mg/dL (ref 70–99)

## 2022-01-07 MED ORDER — METHOCARBAMOL 500 MG PO TABS
500.0000 mg | ORAL_TABLET | Freq: Two times a day (BID) | ORAL | 0 refills | Status: DC
Start: 2022-01-07 — End: 2022-02-22

## 2022-01-07 NOTE — ED Triage Notes (Signed)
Pt presents for back pain. This pain is recurring for patient.  ?

## 2022-01-07 NOTE — ED Notes (Signed)
Patient is being discharged from the Urgent Care and sent to the Emergency Department via personal vehicle . Per Dr Eustace Moore, patient is in need of higher level of care due to hyperglycemia . Patient is aware and verbalizes understanding of plan of care.  ? ?Vitals:  ? 01/07/22 0853  ?BP: 140/62  ?Pulse: 81  ?Resp: 16  ?Temp: 98.2 ?F (36.8 ?C)  ?SpO2: 100%  ?  ?

## 2022-01-07 NOTE — ED Provider Notes (Signed)
[facility]    CSN: [id number] Arrival date & time: 01/07/22  2951      History   Chief Complaint Chief Complaint  Patient presents with   Back Pain    HPI Kevin Lambert. is a 66 y.o. male.   Patient is here for low back pain.  He has had pain for years, but comes/goes.  This last week has been bad.   Pain in the low back, worse in the morning.  No fevers/chills.  + urine odor;  + urinary frequency, burning;  that has been going on for years.  He takes a diuretic for his bp and drinks a lot of water.  He is diabetic and always thirsty.  He has talked with his pcp about his back pain and they are "doing nothing".  He takes a lot of motrin for pain.  He states he has had stones in the past. He states this feels like when he had stones in the past.  He is very worried about his kidneys.  He does not know what to do to make them better.  He has been drinking a lot of water and is frustrated by this.   Past Medical History:  Diagnosis Date   Arthritis    Chronic kidney disease    Depression    DM (diabetes mellitus) ([place])    type II   Dyslipidemia    Dyspnea    unable to walk much - he thinks it is from    GERD (gastroesophageal reflux disease)    Hepatitis    Hepatitis C- treated   History of kidney stones    HTN (hypertension)    Neuropathy    nscl ca dx'd 10/2019   OSA on CPAP    PTSD (post-traumatic stress disorder)     Patient Active Problem List   Diagnosis Date Noted   Dehydration 12/22/2021   Muscle cramps 12/22/2021   Nonspecific abnormal results of kidney function study 11/23/2021   Tobacco use disorder, continuous 11/23/2021   Diarrhea 11/18/2021   Erectile dysfunction 08/29/2021   Body mass index (BMI) of 35.0-35.9 in adult 08/29/2021   Cigarette nicotine dependence without complication 88/41/6606   Encounter for Medicare annual wellness exam 05/09/2021   Need for shingles vaccine 05/09/2021   Atherosclerosis of both carotid arteries  02/28/2021   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 02/28/2021   Essential hypertension 02/28/2021   Abnormal renal function 02/28/2021   Dizziness 01/10/2021   Bruit 01/10/2021   Malignant neoplasm of lung ([place]) 01/10/2021   Encounter to establish care 01/10/2021   Terminal illness 01/08/2021   Obstructive sleep apnea 01/08/2021   Renal calculus 01/08/2021   Psychosexual dysfunction with inhibited sexual excitement 01/08/2021   Pain in joint, shoulder region 01/08/2021   Need for prophylactic vaccination and inoculation against influenza 01/08/2021   Hyperlipidemia 01/08/2021   Hemorrhage of rectum and anus 01/08/2021   Family history of malignant neoplasm of gastrointestinal tract 01/08/2021   Dental caries, unspecified 01/08/2021   Cervicalgia 01/08/2021   Cellulitis and abscess 01/08/2021   Back pain 01/08/2021   Adrenal adenoma 01/08/2021   Adenomatous polyp of colon 01/08/2021   Acute upper respiratory infection 01/08/2021   Swelling of both lower extremities 08/03/2020   Healthcare maintenance 05/21/2020   DOE (dyspnea on exertion) 05/21/2020   At risk for obstructive sleep apnea 05/21/2020   Encounter for antineoplastic immunotherapy 04/20/2020   Port-A-Cath in place 01/15/2020   Encounter  for antineoplastic chemotherapy 12/16/2019   Goals of care, counseling/discussion 12/16/2019   Mediastinal adenopathy 12/10/2019   Non-small cell cancer of right lung ([place]) 11/29/2019   Tobacco abuse 11/29/2019   HTN (hypertension) 11/29/2019   DM (diabetes mellitus) ([place]) 11/29/2019   Sleep disturbance 11/07/1993    Past Surgical History:  Procedure Laterality Date   BIOPSY OF MEDIASTINAL MASS  12/10/2019   Procedure: BIOPSY OF MEDIASTINAL MASS;  Surgeon: Garner Nash, DO;  Location: [facility];  Service: Pulmonary;;  right upper lobe   BRONCHIAL NEEDLE ASPIRATION BIOPSY  12/10/2019   Procedure: BRONCHIAL NEEDLE ASPIRATION BIOPSIES;  Surgeon:  Garner Nash, DO;  Location: [facility];  Service: Pulmonary;;   COLONOSCOPY     IR IMAGING GUIDED PORT INSERTION  12/31/2019   kidney stone     URETERAL STENT PLACEMENT     Ureteral Stent Removed     VIDEO BRONCHOSCOPY WITH ENDOBRONCHIAL ULTRASOUND N/A 12/10/2019   Procedure: VIDEO BRONCHOSCOPY WITH ENDOBRONCHIAL ULTRASOUND;  Surgeon: Garner Nash, DO;  Location: [facility];  Service: Pulmonary;  Laterality: N/A;       Home Medications    Prior to Admission medications   Medication Sig Start Date End Date Taking? Authorizing Provider  amLODipine (NORVASC) 10 MG tablet Take 1 tablet (10 mg total) by mouth daily. 11/23/21   [name], NP  Ascorbic Acid (VITAMIN C) 1000 MG tablet Take 1,000 mg by mouth daily.    [provider]  aspirin EC 81 MG tablet Take 81 mg by mouth daily.    [provider]  atorvastatin (LIPITOR) 10 MG tablet Take 1 tablet (10 mg total) by mouth daily. 11/23/21   [name], NP  carvedilol (COREG) 3.125 MG tablet Take 1 tablet (3.125 mg total) by mouth 2 (two) times daily with a meal. 11/23/21   [name], NP  cholecalciferol (VITAMIN D3) 25 MCG (1000 UNIT) tablet Take 1,000 Units by mouth daily.    [provider]  Cyanocobalamin (VITAMIN B 12 PO) Take 1 tablet by mouth daily.    [provider]  empagliflozin (JARDIANCE) 25 MG TABS tablet Take 1 tablet (25 mg total) by mouth daily before breakfast. 11/23/21   [name], NP  furosemide (LASIX) 20 MG tablet Take 1 tablet (20 mg total) by mouth daily. As needed 08/27/21   [name], NP  magic mouthwash SOLN Take 5 mLs by mouth 4 (four) times daily as needed for mouth pain. Swish and swallow or spit 01/28/20   Tanner, Lyndon Code., PA-C  meclizine (ANTIVERT) 25 MG tablet TAKE ONE-HALF TABLET BY MOUTH THREE TIMES A DAY AS NEEDED FOR DIZZINESS 11/23/21   [name], NP  meclizine (ANTIVERT) 25 MG tablet TAKE ONE-HALF TABLET BY MOUTH THREE  TIMES A DAY AS NEEDED FOR DIZZINESS 11/23/21   [provider]  nicotine (NICODERM CQ - DOSED IN MG/24 HOURS) 21 mg/24hr patch Place 1 patch (21 mg total) onto the skin daily. 08/27/21   [name], NP  omeprazole (PRILOSEC) 20 MG capsule Take 1 capsule (20 mg total) by mouth daily. 11/24/21   Heilingoetter, Cassandra L, PA-C  predniSONE (DELTASONE) 20 MG tablet Take 5 tablets (100 mg) daily for 1 week, followed by 4 tablets (80 mg) daily for 1 week, followed by 3 tablets daily (60 mg) daily for 1 week, followed by 2 tablets (40 mg) daily for 1 week, followed by 1 tablet (20 mg) daily for 1 week. 11/24/21  Heilingoetter, Cassandra L, PA-C  predniSONE (DELTASONE) 20 MG tablet TAKE FIVE TABLETS BY MOUTH DAILY FOR 1 WEEK, THEN FOUR TABLETS DAILY FOR 1 WEEK, THEN THREE TABLETS DAILY FOR 1 WEEK, THEN TWO TABLETS DAILY FOR 1 WEEK, THEN ONE TABLET DAILY FOR 1 WEEK.  CCNRX-ONCOLOGY FOR 1 WEEK, THEN FOUR TABLETS DAILY FOR 1 WEEK, THEN THREE TABLETS DAILY FOR 1 WEEK, THEN TWO TABLETS DAILY FOR 1 WEEK, THEN ONE TABLET DAILY FOR 1 WEEK.  CCNRX-ONCOLOGY 11/24/21   [provider]  prochlorperazine (COMPAZINE) 10 MG tablet Take 1 tablet (10 mg total) by mouth every 6 (six) hours as needed. 11/18/21   Heilingoetter, Cassandra L, PA-C  prochlorperazine (COMPAZINE) 10 MG tablet Take 1 tablet by mouth every 6 (six) hours as needed. 11/18/21   [provider]  Propylene Glycol (SYSTANE BALANCE OP) Place 1 drop into both eyes daily.    [provider]  sildenafil (VIAGRA) 100 MG tablet TAKE ONE TABLET BY MOUTH AS INSTRUCTED (TAKE 1 HOUR PRIOR TO SEXUAL ACTIVITY *DO NOT EXCEED 1 DOSE PER 24 HOUR PERIOD*) 11/23/21   [name], NP  triamterene-hydrochlorothiazide (MAXZIDE-25) 37.5-25 MG tablet Take 1 tablet by mouth daily. 11/23/21   [name], NP  zinc gluconate 50 MG tablet Take 50 mg by mouth daily.    [provider]    Family History Family History  Problem  Relation Age of Onset   Liver disease Mother    Diabetes Father     Social History Social History   Tobacco Use   Smoking status: Some Days    Packs/day: 0.60    Years: 50.00    Pack years: 30.00    Types: Cigars, Cigarettes   Smokeless tobacco: Never   Tobacco comments:    03/25/21: 1-2 Black & Milds  Vaping Use   Vaping Use: Never used  Substance Use Topics   Alcohol use: Yes    Alcohol/week: 6.0 standard drinks    Types: 6 Cans of beer per week   Drug use: Not Currently     Allergies   Metformin and related, Naproxen, and Oxycontin [oxycodone]   Review of Systems Review of Systems  Constitutional:  Negative for chills and fever.  HENT: Negative.    Respiratory: Negative.    Cardiovascular: Negative.   Gastrointestinal: Negative.   Genitourinary:  Positive for frequency and urgency.  Musculoskeletal:  Positive for back pain.    Physical Exam Triage Vital Signs ED Triage Vitals  Enc Vitals Group     BP 01/07/22 0853 140/62     Pulse Rate 01/07/22 0853 81     Resp 01/07/22 0853 16     Temp 01/07/22 0853 98.2 F (36.8 C)     Temp Source 01/07/22 0853 Oral     SpO2 01/07/22 0853 100 %     Weight --      Height --      Head Circumference --      Peak Flow --      Pain Score 01/07/22 0854 9     Pain Loc --      Pain Edu? --      Excl. in [place]? --    No data found.  Updated Vital Signs BP 140/62 (BP Location: Left Arm)    Pulse 81    Temp 98.2 F (36.8 C) (Oral)    Resp 16    SpO2 100%   Visual Acuity Right Eye Distance:   Left Eye Distance:  Bilateral Distance:    Right Eye Near:   Left Eye Near:    Bilateral Near:     Physical Exam Constitutional:      Appearance: Normal appearance.  Cardiovascular:     Rate and Rhythm: Normal rate and regular rhythm.  Pulmonary:     Effort: Pulmonary effort is normal.     Breath sounds: Normal breath sounds.  Abdominal:     Palpations: Abdomen is soft.     Tenderness: There is no abdominal tenderness.  There is no guarding.  Musculoskeletal:     Comments: No spinous tenderness noted;  He does have TTP to the mid back bilaterally, just below the ribs;  painful with movement;  limited ROM;   Neurological:     Mental Status: He is alert.     UC Treatments / Results  Labs (all labs ordered are listed, but only abnormal results are displayed) Labs Reviewed  POCT URINALYSIS DIPSTICK, ED / UC - Abnormal; Notable for the following components:      Result Value   Glucose, UA >=1000 (*)    Hgb urine dipstick TRACE (*)    All other components within normal limits  CBG MONITORING, ED - Abnormal; Notable for the following components:   Glucose-Capillary >600 (*)    All other components within normal limits  CBG MONITORING, ED    EKG   Radiology No results found.  Procedures Procedures (including critical care time)  Medications Ordered in UC Medications - No data to display  Initial Impression / Assessment and Plan / UC Course  I have reviewed the triage vital signs and the nursing notes.  Pertinent labs & imaging results that were available during my care of the patient were reviewed by me and considered in my medical decision making (see chart for details).  Please see below;  Back pain likely muscular.  Due to kidney function he is unable to take nsaids.   Concern for very elevated blood sugar.  HE does have diabetes, and was recently on prednisone.  He is agreeable to be seen in the ER today and will go directly there from here.    Final Clinical Impressions(s) / UC Diagnoses   Final diagnoses:  Mid back pain, chronic  Abnormal urine odor  Urinary frequency  Stage 4 chronic kidney disease (HCC)  Type 2 diabetes mellitus with other specified complication, without long-term current use of insulin (HCC)  Hyperglycemia     Discharge Instructions      You were seen today primary for back pain.  Your xray does show arthritis, but I think is is mostly  muscular in nature.   I have sent out a muscle relaxer for you.  The biggest concern is your blood sugar.  Your urine showed slight blood, but also showed a large amount of sugar.  Your blood sugar was too high to be read on our machine.  This is likely due to recent prednisone use.  However, given the elevated reading you should go to the ER for evaluation at this time, where they can test this more thoroughly and treat this today.     ED Prescriptions     Medication Sig Dispense Auth. Provider   methocarbamol (ROBAXIN) 500 MG tablet Take 1 tablet (500 mg total) by mouth 2 (two) times daily. 20 tablet Rondel Oh, MD      PDMP not reviewed this encounter.   Rondel Oh, MD 01/07/22 [phone]

## 2022-01-07 NOTE — Discharge Instructions (Signed)
You were seen today primary for back pain.  Your xray does show arthritis, but I think is is mostly  muscular in nature.  I have sent out a muscle relaxer for you.  ?The biggest concern is your blood sugar.  Your urine showed slight blood, but also showed a large amount of sugar.  Your blood sugar was too high to be read on our machine.  This is likely due to recent prednisone use.  However, given the elevated reading you should go to the ER for evaluation at this time, where they can test this more thoroughly and treat this today.  ?

## 2022-01-11 ENCOUNTER — Institutional Professional Consult (permissible substitution): Payer: No Typology Code available for payment source | Admitting: Primary Care

## 2022-01-14 ENCOUNTER — Other Ambulatory Visit: Payer: Self-pay

## 2022-01-14 ENCOUNTER — Ambulatory Visit (INDEPENDENT_AMBULATORY_CARE_PROVIDER_SITE_OTHER): Payer: No Typology Code available for payment source | Admitting: Nurse Practitioner

## 2022-01-14 ENCOUNTER — Encounter: Payer: Self-pay | Admitting: Nurse Practitioner

## 2022-01-14 VITALS — Ht 69.0 in | Wt 225.0 lb

## 2022-01-14 DIAGNOSIS — M545 Low back pain, unspecified: Secondary | ICD-10-CM | POA: Diagnosis not present

## 2022-01-14 MED ORDER — CYCLOBENZAPRINE HCL 10 MG PO TABS: 10.0000 mg | ORAL_TABLET | Freq: Every evening | ORAL | 1 refills | Status: DC | PRN

## 2022-01-14 NOTE — Progress Notes (Unsigned)
Virtual Visit via Telephone Note  I connected with [name]. on 01/14/22 at 11:00 AM EST by telephone and verified that I am speaking with the correct person using two identifiers.  Location: Patient: *** Provider: ***   I discussed the limitations, risks, security and privacy concerns of performing an evaluation and management service by telephone and the availability of in person appointments. I also discussed with the patient that there may be a patient responsible charge related to this service. The patient expressed understanding and agreed to proceed.   History of Present Illness:    Observations/Objective:   Assessment and Plan:   Follow Up Instructions:    I discussed the assessment and treatment plan with the patient. The patient was provided an opportunity to ask questions and all were answered. The patient agreed with the plan and demonstrated an understanding of the instructions.   The patient was advised to call back or seek an in-person evaluation if the symptoms worsen or if the condition fails to improve as anticipated.  I provided *** minutes of non-face-to-face time during this encounter.   [name], NP

## 2022-01-17 ENCOUNTER — Telehealth: Payer: Self-pay | Admitting: Medical Oncology

## 2022-01-17 ENCOUNTER — Encounter: Payer: Self-pay | Admitting: Gastroenterology

## 2022-01-17 NOTE — Telephone Encounter (Signed)
Pt has not heard from GI referral . I called [facility] . They left a VM  . She will try to contact him today. ?

## 2022-01-20 ENCOUNTER — Ambulatory Visit: Payer: No Typology Code available for payment source | Admitting: Internal Medicine

## 2022-01-20 ENCOUNTER — Other Ambulatory Visit: Payer: No Typology Code available for payment source

## 2022-01-20 ENCOUNTER — Ambulatory Visit: Payer: No Typology Code available for payment source

## 2022-01-31 ENCOUNTER — Encounter: Payer: Self-pay | Admitting: Primary Care

## 2022-01-31 ENCOUNTER — Ambulatory Visit (INDEPENDENT_AMBULATORY_CARE_PROVIDER_SITE_OTHER): Payer: No Typology Code available for payment source | Admitting: Primary Care

## 2022-01-31 ENCOUNTER — Other Ambulatory Visit: Payer: Self-pay

## 2022-01-31 VITALS — BP 128/68 | HR 74 | Temp 98.6°F | Ht 69.0 in | Wt 223.0 lb

## 2022-01-31 DIAGNOSIS — I10 Essential (primary) hypertension: Secondary | ICD-10-CM

## 2022-01-31 DIAGNOSIS — Z6832 Body mass index (BMI) 32.0-32.9, adult: Secondary | ICD-10-CM

## 2022-01-31 DIAGNOSIS — G4733 Obstructive sleep apnea (adult) (pediatric): Secondary | ICD-10-CM

## 2022-01-31 NOTE — Assessment & Plan Note (Signed)
-   Encourage weight loss efforts  ?

## 2022-01-31 NOTE — Progress Notes (Signed)
? ?@Patient  ID: Kevin Flavin., male    DOB: Aug 01, 1956, 66 y.o.   MRN: [id number] ? ?Chief Complaint  ?Patient presents with  ? Consult  ?  Pt has had sleep study done in 2007. Pt was on a cpap but not currently.   ? ? ?Referring provider: ?Augustine Radar, MD ? ?HPI: ?66 year old male. PMH significant for HTN, atherosclerosis, mediastinal adenopathy, non-small cell lung cancer, OSA, type 2 diabetes, hyperlipidemia.  ? ?01/31/2022 ?Patient presents today for sleep consult. Patient has a history of sleep apnea, his last sleep study was in 2007 (results not available in chart). He was on CPAP therapy but stopped wearing about 5 years ago d/t life circumstance. He continues to have symptoms of loud snoring and disrupted sleep. He wakes up on average three times at night. He has associated daytime sleepiness. Epwoth score 18. Denies narcolepsy, cataplexy, sleep walking.  ? ?Sleep questionnaire ?Symptoms-  Snoring, disrupted sleep  ?Prior sleep study- Yes, > 10 years ago  ?Bedtime- 11pm-12am ?Time to fall asleep- 10 mins ?Nocturnal awakenings- 3 ?Out of bed/start of day- 9am ?Weight changes- Increased steadily  ?Do you operate heavy machinery- No ?Do you currently wear CPAP- No ?Do you current wear oxygen- No ?Epworth- 18 ? ?Allergies  ?Allergen Reactions  ? Metformin And Related Other (See Comments)  ?  Severe muscle spasms  ? Naproxen   ?  Pt states it have him off balance.  ? Oxycontin [Oxycodone] Hives  ? ? ?Immunization History  ?Administered Date(s) Administered  ? Hep A / Hep B 01/06/2016, 02/05/2016, 07/13/2016  ? Influenza Split 09/04/2014  ? Influenza-Unspecified 07/11/2007, 08/27/2013, 09/04/2014  ? Moderna Sars-Covid-2 Vaccination 01/03/2020, 12/31/2020  ? PFIZER(Purple Top)SARS-COV-2 Vaccination 11/20/2020, 12/11/2020  ? Pneumococcal-Unspecified 05/21/2020  ? Tdap 07/13/2017  ? Zoster Recombinat (Shingrix) 09/22/2020, 04/28/2021  ? ? ?Past Medical History:  ?Diagnosis Date  ? Arthritis   ? Chronic kidney  disease   ? Depression   ? DM (diabetes mellitus) ([place])   ? type II  ? Dyslipidemia   ? Dyspnea   ? unable to walk much - he thinks it is from   ? GERD (gastroesophageal reflux disease)   ? Hepatitis   ? Hepatitis C- treated  ? History of kidney stones   ? HTN (hypertension)   ? Neuropathy   ? nscl ca dx'd 10/2019  ? OSA on CPAP   ? PTSD (post-traumatic stress disorder)   ? ? ?Tobacco History: ?Social History  ? ?Tobacco Use  ?Smoking Status Some Days  ? Packs/day: 0.60  ? Years: 50.00  ? Pack years: 30.00  ? Types: Cigars, Cigarettes  ?Smokeless Tobacco Never  ?Tobacco Comments  ? 03/25/21: 1-2 Black & Milds  ? ?Ready to quit: Not Answered ?Counseling given: Not Answered ?Tobacco comments: 03/25/21: 1-2 Black & Milds ? ? ?Outpatient Medications Prior to Visit  ?Medication Sig Dispense Refill  ? amLODipine (NORVASC) 10 MG tablet Take 1 tablet (10 mg total) by mouth daily. 90 tablet 1  ? Ascorbic Acid (VITAMIN C) 1000 MG tablet Take 1,000 mg by mouth daily.    ? aspirin EC 81 MG tablet Take 81 mg by mouth daily.    ? atorvastatin (LIPITOR) 10 MG tablet Take 1 tablet (10 mg total) by mouth daily. 90 tablet 1  ? carvedilol (COREG) 3.125 MG tablet Take 1 tablet (3.125 mg total) by mouth 2 (two) times daily with a meal. 180 tablet 1  ? cholecalciferol (VITAMIN D3) 25 MCG (1000  UNIT) tablet Take 1,000 Units by mouth daily.    ? Cyanocobalamin (VITAMIN B 12 PO) Take 1 tablet by mouth daily.    ? cyclobenzaprine (FLEXERIL) 10 MG tablet Take 1 tablet (10 mg total) by mouth at bedtime as needed for muscle spasms. 30 tablet 1  ? empagliflozin (JARDIANCE) 25 MG TABS tablet Take 1 tablet (25 mg total) by mouth daily before breakfast. 90 tablet 1  ? furosemide (LASIX) 20 MG tablet Take 1 tablet (20 mg total) by mouth daily. As needed 90 tablet 0  ? magic mouthwash SOLN Take 5 mLs by mouth 4 (four) times daily as needed for mouth pain. Swish and swallow or spit 240 mL 2  ? meclizine (ANTIVERT) 25 MG tablet TAKE ONE-HALF TABLET BY  MOUTH THREE TIMES A DAY AS NEEDED FOR DIZZINESS 30 tablet 2  ? meclizine (ANTIVERT) 25 MG tablet TAKE ONE-HALF TABLET BY MOUTH THREE TIMES A DAY AS NEEDED FOR DIZZINESS    ? methocarbamol (ROBAXIN) 500 MG tablet Take 1 tablet (500 mg total) by mouth 2 (two) times daily. 20 tablet 0  ? nicotine (NICODERM CQ - DOSED IN MG/24 HOURS) 21 mg/24hr patch Place 1 patch (21 mg total) onto the skin daily. 30 patch 1  ? omeprazole (PRILOSEC) 20 MG capsule Take 1 capsule (20 mg total) by mouth daily. 30 capsule 1  ? prochlorperazine (COMPAZINE) 10 MG tablet Take 1 tablet (10 mg total) by mouth every 6 (six) hours as needed. 30 tablet 2  ? prochlorperazine (COMPAZINE) 10 MG tablet Take 1 tablet by mouth every 6 (six) hours as needed.    ? Propylene Glycol (SYSTANE BALANCE OP) Place 1 drop into both eyes daily.    ? sildenafil (VIAGRA) 100 MG tablet TAKE ONE TABLET BY MOUTH AS INSTRUCTED (TAKE 1 HOUR PRIOR TO SEXUAL ACTIVITY *DO NOT EXCEED 1 DOSE PER 24 HOUR PERIOD*) 10 tablet 5  ? triamterene-hydrochlorothiazide (MAXZIDE-25) 37.5-25 MG tablet Take 1 tablet by mouth daily. 90 tablet 1  ? zinc gluconate 50 MG tablet Take 50 mg by mouth daily.    ? predniSONE (DELTASONE) 20 MG tablet Take 5 tablets (100 mg) daily for 1 week, followed by 4 tablets (80 mg) daily for 1 week, followed by 3 tablets daily (60 mg) daily for 1 week, followed by 2 tablets (40 mg) daily for 1 week, followed by 1 tablet (20 mg) daily for 1 week. 105 tablet 0  ? predniSONE (DELTASONE) 20 MG tablet TAKE FIVE TABLETS BY MOUTH DAILY FOR 1 WEEK, THEN FOUR TABLETS DAILY FOR 1 WEEK, THEN THREE TABLETS DAILY FOR 1 WEEK, THEN TWO TABLETS DAILY FOR 1 WEEK, THEN ONE TABLET DAILY FOR 1 WEEK.  CCNRX-ONCOLOGY FOR 1 WEEK, THEN FOUR TABLETS DAILY FOR 1 WEEK, THEN THREE TABLETS DAILY FOR 1 WEEK, THEN TWO TABLETS DAILY FOR 1 WEEK, THEN ONE TABLET DAILY FOR 1 WEEK.  CCNRX-ONCOLOGY    ? ?No facility-administered medications prior to visit.  ? ?Review of Systems ? ?Review of  Systems  ?Constitutional:  Positive for fatigue.  ?HENT: Negative.    ?Respiratory: Negative.    ?Psychiatric/Behavioral:  Positive for sleep disturbance.   ? ? ?Physical Exam ? ?BP 128/68 (BP Location: Right Arm, Patient Position: Sitting, Cuff Size: Normal)   Pulse 74   Temp 98.6 ?F (37 ?C) (Oral)   Ht 5\' 9"  (1.753 m)   Wt 223 lb (101.2 kg)   SpO2 99%   BMI 32.93 kg/m?  ?Physical Exam ?Constitutional:   ?  General: He is not in acute distress. ?   Appearance: Normal appearance. He is not ill-appearing.  ?HENT:  ?   Head: Normocephalic and atraumatic.  ?   Mouth/Throat:  ?   Mouth: Mucous membranes are moist.  ?   Pharynx: Oropharynx is clear.  ?Cardiovascular:  ?   Rate and Rhythm: Normal rate and regular rhythm.  ?Pulmonary:  ?   Effort: Pulmonary effort is normal.  ?   Breath sounds: Normal breath sounds. No wheezing, rhonchi or rales.  ?Musculoskeletal:     ?   General: Normal range of motion.  ?Skin: ?   General: Skin is warm and dry.  ?Neurological:  ?   General: No focal deficit present.  ?   Mental Status: He is alert and oriented to person, place, and time. Mental status is at baseline.  ?Psychiatric:     ?   Mood and Affect: Mood normal.     ?   Behavior: Behavior normal.     ?   Thought Content: Thought content normal.     ?   Judgment: Judgment normal.  ?  ? ?Lab Results: ? ?CBC ?   ?Component Value Date/Time  ? WBC 8.0 12/23/2021 0933  ? WBC 6.4 06/03/2021 1123  ? RBC 4.25 12/23/2021 0933  ? HGB 13.5 12/23/2021 0933  ? HCT 38.6 (L) 12/23/2021 0933  ? PLT 147 (L) 12/23/2021 0933  ? MCV 90.8 12/23/2021 0933  ? MCH 31.8 12/23/2021 0933  ? MCHC 35.0 12/23/2021 0933  ? RDW 14.2 12/23/2021 0933  ? LYMPHSABS 0.7 12/23/2021 0933  ? MONOABS 0.1 12/23/2021 0933  ? EOSABS 0.1 12/23/2021 0933  ? BASOSABS 0.0 12/23/2021 0933  ? ? ?BMET ?   ?Component Value Date/Time  ? NA 125 (L) 12/23/2021 0933  ? NA 138 01/08/2021 0955  ? K 5.1 12/23/2021 0933  ? CL 88 (L) 12/23/2021 0933  ? CO2 24 12/23/2021 0933  ?  GLUCOSE 385 (H) 12/23/2021 0933  ? BUN 68 (H) 12/23/2021 0933  ? BUN 28 (H) 01/08/2021 0955  ? CREATININE 2.96 (H) 12/23/2021 0933  ? CALCIUM 9.8 12/23/2021 0933  ? GFRNONAA 23 (L) 12/23/2021 0933  ? GFRAA 58 (L) 08/11/19

## 2022-01-31 NOTE — Patient Instructions (Signed)
?  Sleep apnea is defined as period of 10 seconds or longer when you stop breathing at night. This can happen multiple times a night. Dx sleep apnea is when this occurs more than 5 times an hour.  ?  ?Mild OSA 5-15 apneic events an hour ?Moderate OSA 15-30 apneic events an hour ?Severe OSA > 30 apneic events an hour ?  ?Untreated sleep apnea puts you at higher risk for cardiac arrhythmias, pulmonary HTN, stroke and diabetes ?  ?Treatment options include weight loss, side sleeping position, oral appliance, CPAP therapy or referral to ENT for possible surgical options  ?  ?Recommendations: ?Focus on side sleeping position ?Work on weight loss efforts  ?Do not drive if experiencing excessive daytime sleepiness of fatigue  ?  ?Orders: ?Home sleep study re: snoring  ?  ?Follow-up: ?4-6 week visit to review sleep study results and discuss treatment options further ? ?

## 2022-01-31 NOTE — Assessment & Plan Note (Signed)
-   Stable; BP 128/68 ?

## 2022-01-31 NOTE — Assessment & Plan Note (Addendum)
-   Patient has symptoms of snoring, disrupted sleep, daytime sleepiness. Epworth 18. Hx sleep apnea. He has not worn CPAP in 5 years. He will need home sleep study to assess degree of OSA. He is open to restarting CPAP if needed. Discussed risk of untreated sleep apnea including cardiac arrhythmias, pulm HTN, stroke, DM. We briefly reviewed treatment options. Encouraged patient to work on weight loss efforts and focus on side sleeping position/elevate head of bed. Advised against driving if experiencing excessive daytime sleepiness. Follow-up in 4-6 weeks to review sleep study results and discuss treatment options further. ?

## 2022-01-31 NOTE — Progress Notes (Signed)
Reviewed and agree with assessment/plan. ? ? ?Chesley Mires, MD ?[facility] ?01/31/2022, 12:13 PM ?Pager:  [phone] ? ?

## 2022-02-02 DIAGNOSIS — N184 Chronic kidney disease, stage 4 (severe): Secondary | ICD-10-CM | POA: Diagnosis not present

## 2022-02-02 DIAGNOSIS — D631 Anemia in chronic kidney disease: Secondary | ICD-10-CM | POA: Diagnosis not present

## 2022-02-02 DIAGNOSIS — N2581 Secondary hyperparathyroidism of renal origin: Secondary | ICD-10-CM | POA: Diagnosis not present

## 2022-02-02 DIAGNOSIS — I129 Hypertensive chronic kidney disease with stage 1 through stage 4 chronic kidney disease, or unspecified chronic kidney disease: Secondary | ICD-10-CM | POA: Diagnosis not present

## 2022-02-03 ENCOUNTER — Ambulatory Visit: Payer: No Typology Code available for payment source | Admitting: Gastroenterology

## 2022-02-04 ENCOUNTER — Other Ambulatory Visit: Payer: Self-pay | Admitting: Nephrology

## 2022-02-04 ENCOUNTER — Other Ambulatory Visit (INDEPENDENT_AMBULATORY_CARE_PROVIDER_SITE_OTHER): Payer: No Typology Code available for payment source

## 2022-02-04 ENCOUNTER — Ambulatory Visit (INDEPENDENT_AMBULATORY_CARE_PROVIDER_SITE_OTHER): Payer: Medicare PPO | Admitting: Gastroenterology

## 2022-02-04 ENCOUNTER — Encounter: Payer: Self-pay | Admitting: Gastroenterology

## 2022-02-04 VITALS — BP 122/70 | HR 94 | Ht 66.0 in | Wt 223.0 lb

## 2022-02-04 DIAGNOSIS — Z59819 Housing instability, housed unspecified: Secondary | ICD-10-CM | POA: Insufficient documentation

## 2022-02-04 DIAGNOSIS — Z5901 Sheltered homelessness: Secondary | ICD-10-CM | POA: Insufficient documentation

## 2022-02-04 DIAGNOSIS — N184 Chronic kidney disease, stage 4 (severe): Secondary | ICD-10-CM

## 2022-02-04 DIAGNOSIS — K521 Toxic gastroenteritis and colitis: Secondary | ICD-10-CM

## 2022-02-04 DIAGNOSIS — N2581 Secondary hyperparathyroidism of renal origin: Secondary | ICD-10-CM

## 2022-02-04 DIAGNOSIS — Z860100 Personal history of colon polyps, unspecified: Secondary | ICD-10-CM | POA: Insufficient documentation

## 2022-02-04 DIAGNOSIS — N189 Chronic kidney disease, unspecified: Secondary | ICD-10-CM

## 2022-02-04 DIAGNOSIS — D631 Anemia in chronic kidney disease: Secondary | ICD-10-CM

## 2022-02-04 DIAGNOSIS — I129 Hypertensive chronic kidney disease with stage 1 through stage 4 chronic kidney disease, or unspecified chronic kidney disease: Secondary | ICD-10-CM

## 2022-02-04 LAB — COMPREHENSIVE METABOLIC PANEL
ALT: 14 U/L (ref 0–53)
AST: 12 U/L (ref 0–37)
Albumin: 4.1 g/dL (ref 3.5–5.2)
Alkaline Phosphatase: 54 U/L (ref 39–117)
BUN: 23 mg/dL (ref 6–23)
CO2: 29 mEq/L (ref 19–32)
Calcium: 10.3 mg/dL (ref 8.4–10.5)
Chloride: 98 mEq/L (ref 96–112)
Creatinine, Ser: 1.5 mg/dL (ref 0.40–1.50)
GFR: 48.25 mL/min — ABNORMAL LOW (ref >60.00)
Glucose, Bld: 168 mg/dL — ABNORMAL HIGH (ref 70–99)
Potassium: 3.9 mEq/L (ref 3.5–5.1)
Sodium: 135 mEq/L (ref 135–145)
Total Bilirubin: 0.4 mg/dL (ref 0.2–1.2)
Total Protein: 6.8 g/dL (ref 6.0–8.3)

## 2022-02-04 NOTE — Progress Notes (Signed)
? 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  The patient had been on [place] for almost 2 years when he developed abrupt onset abdominal pain and diarrhea in December.  He reports having significant abdominal pain for several weeks and diarrhea characterized by 6-7 watery bowel movements per day.  He had significant urgency and frequent incontinence with his diarrhea.  He had frequent nocturnal bowel movements.  He denies any bloody bowel movements.  No fevers or chills.  No nausea or vomiting. ?He denied having any history of similar symptoms.  Stool studies were negative for infectious etiologies.  A CMP was notable for significant electrolyte abnormalities and acute on chronic renal insufficiency.  He was given a Medrol Dosepak, and then later a prolonged steroid taper which he finished about 5 weeks ago.  He does not think that his symptoms improved at all with the steroids.   ? ?His symptoms gradually resolved as of about 3 weeks ago.  His last dose of Keytruda was in January.  His February dose was withheld, and the patient states that he is no longer planning to be treated with Keytruda.  Is unclear if another agent will be used in its place. ?Currently, the patient is having 1-2 bowel movements per day.  His stools are formed and soft.  He is not having any abdominal pain. ? ? ?Past Medical History:  ?Diagnosis Date  ? Arthritis   ? Chronic kidney disease   ? Depression   ? DM (diabetes mellitus) ([place])   ? type II  ? Dyslipidemia   ? Dyspnea   ? unable to walk much - he thinks it is from   ? GERD (gastroesophageal reflux disease)   ? Hepatitis   ? Hepatitis C- treated  ? History of kidney stones   ? HTN (hypertension)   ? Neuropathy   ? nscl ca dx'd 10/2019  ? OSA on CPAP   ? PTSD (post-traumatic stress disorder)   ? ? ? ?Past Surgical History:  ?Procedure  Laterality Date  ? BIOPSY OF MEDIASTINAL MASS  12/10/2019  ? Procedure: BIOPSY OF MEDIASTINAL MASS;  Surgeon: Garner Nash, DO;  Location: [facility];  Service: Pulmonary;;  right upper lobe  ? BRONCHIAL NEEDLE ASPIRATION BIOPSY  12/10/2019  ? Procedure: BRONCHIAL NEEDLE ASPIRATION BIOPSIES;  Surgeon: Garner Nash, DO;  Location: [facility];  Service: Pulmonary;;  ? COLONOSCOPY    ? IR IMAGING GUIDED PORT INSERTION  12/31/2019  ? kidney stone    ? URETERAL STENT PLACEMENT    ? Ureteral Stent Removed    ? VIDEO BRONCHOSCOPY WITH ENDOBRONCHIAL ULTRASOUND N/A 12/10/2019  ? Procedure: VIDEO BRONCHOSCOPY WITH ENDOBRONCHIAL ULTRASOUND;  Surgeon: Garner Nash, DO;  Location: [facility];  Service: Pulmonary;  Laterality: N/A;  ? ?Family History  ?Problem Relation Age of Onset  ? Liver disease Mother   ? Diabetes Father   ? Diabetes Brother   ? Esophageal cancer Neg Hx   ? Colon cancer Neg Hx   ? Stomach cancer Neg Hx   ? Pancreatic cancer Neg Hx   ? ?Social History  ? ?Tobacco Use  ? Smoking status: Some Days  ?  Packs/day: 0.60  ?  Years: 50.00  ?  Pack years: 30.00  ?  Types: Cigars, Cigarettes  ? Smokeless tobacco: Never  ? Tobacco comments:  ?  03/25/21: 1-2 Black & Milds  ?Vaping Use  ? Vaping Use: Never used  ?Substance Use Topics  ? Alcohol use: Yes  ?  Alcohol/week: 6.0 standard drinks  ?  Types: 6 Cans of beer per week  ?  Comment: occ  ? Drug use: Never  ? ?Current Outpatient Medications  ?Medication Sig Dispense Refill  ? amLODipine (NORVASC) 10 MG tablet Take 1 tablet (10 mg total) by mouth daily. 90 tablet 1  ? Ascorbic Acid (VITAMIN C) 1000 MG tablet Take 1,000 mg by mouth daily.    ? aspirin EC 81 MG tablet Take 81 mg by mouth daily.    ? atorvastatin (LIPITOR) 10 MG tablet Take 1 tablet (10 mg total) by mouth daily. 90 tablet 1  ? carvedilol (COREG) 3.125 MG tablet Take 1 tablet (3.125 mg total) by mouth 2 (two) times daily with a meal. 180 tablet 1  ? cholecalciferol (VITAMIN D3) 25 MCG (1000  UNIT) tablet Take 1,000 Units by mouth daily.    ? Cyanocobalamin (VITAMIN B 12 PO) Take 1 tablet by mouth daily.    ? empagliflozin (JARDIANCE) 25 MG TABS tablet Take 1 tablet (25 mg total) by mouth daily before breakfast. 90 tablet 1  ? magic mouthwash SOLN Take 5 mLs by mouth 4 (four) times daily as needed for mouth pain. Swish and swallow or spit 240 mL 2  ? meclizine (ANTIVERT) 25 MG tablet TAKE ONE-HALF TABLET BY MOUTH THREE TIMES A DAY AS NEEDED FOR DIZZINESS 30 tablet 2  ? meclizine (ANTIVERT) 25 MG tablet TAKE ONE-HALF TABLET BY MOUTH THREE TIMES A DAY AS NEEDED FOR DIZZINESS    ? methocarbamol (ROBAXIN) 500 MG tablet Take 1 tablet (500 mg total) by mouth 2 (two) times daily. 20 tablet 0  ? nicotine (NICODERM CQ - DOSED IN MG/24 HOURS) 21 mg/24hr patch Place 1 patch (21 mg total) onto the skin daily. 30 patch 1  ? omeprazole (PRILOSEC) 20 MG capsule Take 1 capsule (20 mg total) by mouth daily. 30 capsule 1  ? prochlorperazine (COMPAZINE) 10 MG tablet Take 1 tablet (10 mg total) by mouth every 6 (six) hours as needed. 30 tablet 2  ? prochlorperazine (COMPAZINE) 10 MG tablet Take 1 tablet by mouth every 6 (six) hours as needed.    ? Propylene Glycol (SYSTANE BALANCE OP) Place 1 drop into both eyes daily.    ? sildenafil (VIAGRA) 100 MG tablet TAKE ONE TABLET BY MOUTH AS INSTRUCTED (TAKE 1 HOUR PRIOR TO SEXUAL ACTIVITY *DO NOT EXCEED 1 DOSE PER 24 HOUR PERIOD*) 10 tablet 5  ? triamterene-hydrochlorothiazide (MAXZIDE-25) 37.5-25 MG tablet Take 1 tablet by mouth daily. 90 tablet 1  ? zinc gluconate 50 MG tablet Take 50 mg by mouth daily.    ? ?No current facility-administered medications for this visit.  ? ?Allergies  ?Allergen Reactions  ? Metformin And Related Other (See Comments)  ?  Severe muscle spasms  ? Naproxen   ?  Pt states it have him off balance.  ? Oxycontin [Oxycodone] Hives  ? ? ? ?Review of Systems: ?All systems reviewed and negative except where noted in HPI.  ? ? ?DG Abd 1 View ? ?Result Date:  01/07/2022 ?CLINICAL DATA:  Recurring back pain. EXAM: ABDOMEN - 1 VIEW COMPARISON:  Abdomen and pelvis CT dated 10/26/2021. FINDINGS: Five non-rib-bearing lumbar vertebrae. Mild levoconvex thoracolumbar scoliosis. Marked disc space narrowing with associated spur formation and discogenic sclerosis in the mid and lower lumbar spine without significant change.  Normal bowel gas pattern with stool throughout the right and transverse colon. Atheromatous arterial calcifications. IMPRESSION: 1. No acute abnormality. 2. Mildly prominent stool. 3. Extensive lumbar spine degenerative changes. Electronically Signed   By: Claudie Revering M.D.   On: 01/07/2022 09:41   ? ?Physical Exam: ?Ht 5\' 6"  (1.676 m)   Wt 223 lb (101.2 kg)   BMI 35.99 kg/m?  ?Constitutional: Pleasant,well-developed, African-American male in no acute distress. ?HEENT: Normocephalic and atraumatic. Conjunctivae are normal. No scleral icterus. ?Neck supple.  ?Cardiovascular: Normal rate, regular rhythm.  ?Pulmonary/chest: Effort normal and breath sounds normal. No wheezing, rales or rhonchi. ?Abdominal: Soft, nondistended, nontender. Bowel sounds active throughout. There are no masses palpable. No hepatomegaly. ?Extremities: no edema ?Neurological: Alert and oriented to person place and time. ?Skin: Skin is warm and dry. No rashes noted. ?Psychiatric: Normal mood and affect. Behavior is normal. ? ?CBC ?   ?Component Value Date/Time  ? WBC 8.0 12/23/2021 0933  ? WBC 6.4 06/03/2021 1123  ? RBC 4.25 12/23/2021 0933  ? HGB 13.5 12/23/2021 0933  ? HCT 38.6 (L) 12/23/2021 0933  ? PLT 147 (L) 12/23/2021 0933  ? MCV 90.8 12/23/2021 0933  ? MCH 31.8 12/23/2021 0933  ? MCHC 35.0 12/23/2021 0933  ? RDW 14.2 12/23/2021 0933  ? LYMPHSABS 0.7 12/23/2021 0933  ? MONOABS 0.1 12/23/2021 0933  ? EOSABS 0.1 12/23/2021 0933  ? BASOSABS 0.0 12/23/2021 0933  ? ? ?CMP  ?   ?Component Value Date/Time  ? NA 125 (L) 12/23/2021 0933  ? NA 138 01/08/2021 0955  ? K 5.1 12/23/2021 0933  ? CL  88 (L) 12/23/2021 0933  ? CO2 24 12/23/2021 0933  ? GLUCOSE 385 (H) 12/23/2021 0933  ? BUN 68 (H) 12/23/2021 0933  ? BUN 28 (H) 01/08/2021 0955  ? CREATININE 2.96 (H) 12/23/2021 0933  ? CALCIUM 9.8 12/23/2021 093

## 2022-02-04 NOTE — Patient Instructions (Signed)
If you are age 67 or older, your body mass index should be between 23-30. Your Body mass index is 35.99 kg/m?Marland Kitchen If this is out of the aforementioned range listed, please consider follow up with your Primary Care Provider. ? ?If you are age 27 or younger, your body mass index should be between 19-25. Your Body mass index is 35.99 kg/m?Marland Kitchen If this is out of the aformentioned range listed, please consider follow up with your Primary Care Provider.  ? ?Your provider has requested that you go to the basement level for lab work before leaving today. Press "B" on the elevator. The lab is located at the first door on the left as you exit the elevator. ? ?The [facility] providers would like to encourage you to use [facility] to communicate with providers for non-urgent requests or questions.  Due to long hold times on the telephone, sending your provider a message by [facility] may be a faster and more efficient way to get a response.  Please allow 48 business hours for a response.  Please remember that this is for non-urgent requests.  ? ?Due to recent changes in healthcare laws, you may see the results of your imaging and laboratory studies on MyChart before your provider has had a chance to review them.  We understand that in some cases there may be results that are confusing or concerning to you. Not all laboratory results come back in the same time frame and the provider may be waiting for multiple results in order to interpret others.  Please give [place] 48 hours in order for your provider to thoroughly review all the results before contacting the office for clarification of your results.  ? ? ?It was a pleasure to see you today! ? ?Thank you for trusting me with your gastrointestinal care!   ? ?Scott E.Candis Schatz, MD  ? ?

## 2022-02-07 ENCOUNTER — Telehealth: Payer: Self-pay | Admitting: Primary Care

## 2022-02-07 DIAGNOSIS — G4733 Obstructive sleep apnea (adult) (pediatric): Secondary | ICD-10-CM

## 2022-02-07 NOTE — Telephone Encounter (Signed)
Spoke with pt who states he would like the order he has currently for home sleep study switched to in lab sleep study. Elizabeth please advise.  ?

## 2022-02-07 NOTE — Progress Notes (Signed)
Kevin Lambert,  ?Your electrolytes have normalized and your kidney function is much better.  Please contact our office if your diarrhea returns. ?

## 2022-02-08 NOTE — Telephone Encounter (Signed)
Lmtcb for pt.  ?Left pt a detailed message to call back to confirm he still wants an in-lab study and then we can place order.  ?

## 2022-02-08 NOTE — Telephone Encounter (Signed)
We can change HST to split night hx OSA previously on CPAP. High suspicion he still has sleep apnea. [place] 18

## 2022-02-09 ENCOUNTER — Ambulatory Visit
Admission: RE | Admit: 2022-02-09 | Discharge: 2022-02-09 | Disposition: A | Discharge status: Home / Self Care | Payer: Medicare PPO | Source: Ambulatory Visit | Attending: Nephrology | Admitting: Nephrology

## 2022-02-09 DIAGNOSIS — N189 Chronic kidney disease, unspecified: Secondary | ICD-10-CM | POA: Diagnosis not present

## 2022-02-09 DIAGNOSIS — I129 Hypertensive chronic kidney disease with stage 1 through stage 4 chronic kidney disease, or unspecified chronic kidney disease: Secondary | ICD-10-CM

## 2022-02-09 DIAGNOSIS — N281 Cyst of kidney, acquired: Secondary | ICD-10-CM | POA: Diagnosis not present

## 2022-02-09 DIAGNOSIS — N2581 Secondary hyperparathyroidism of renal origin: Secondary | ICD-10-CM

## 2022-02-09 DIAGNOSIS — N200 Calculus of kidney: Secondary | ICD-10-CM | POA: Diagnosis not present

## 2022-02-09 DIAGNOSIS — D631 Anemia in chronic kidney disease: Secondary | ICD-10-CM

## 2022-02-09 DIAGNOSIS — N184 Chronic kidney disease, stage 4 (severe): Secondary | ICD-10-CM

## 2022-02-11 NOTE — Telephone Encounter (Signed)
Called patient back and he confirmed he wanted the over night test. In lab sleep study ordered and sent. I advised patient that someone will call him soon and schedule this exam with him. Nothing further needed  ?

## 2022-02-16 ENCOUNTER — Encounter: Payer: Self-pay | Admitting: Internal Medicine

## 2022-02-17 ENCOUNTER — Other Ambulatory Visit: Payer: Self-pay

## 2022-02-17 ENCOUNTER — Ambulatory Visit (HOSPITAL_COMMUNITY)
Admission: RE | Admit: 2022-02-17 | Discharge: 2022-02-17 | Disposition: A | Discharge status: Home / Self Care | Payer: Medicare PPO | Source: Ambulatory Visit | Attending: Physician Assistant | Admitting: Physician Assistant

## 2022-02-17 ENCOUNTER — Inpatient Hospital Stay: Payer: Medicare PPO

## 2022-02-17 ENCOUNTER — Inpatient Hospital Stay: Payer: Medicare PPO | Attending: Internal Medicine

## 2022-02-17 DIAGNOSIS — R911 Solitary pulmonary nodule: Secondary | ICD-10-CM | POA: Diagnosis not present

## 2022-02-17 DIAGNOSIS — J439 Emphysema, unspecified: Secondary | ICD-10-CM | POA: Diagnosis not present

## 2022-02-17 DIAGNOSIS — Z9225 Personal history of immunosupression therapy: Secondary | ICD-10-CM | POA: Diagnosis not present

## 2022-02-17 DIAGNOSIS — K529 Noninfective gastroenteritis and colitis, unspecified: Secondary | ICD-10-CM | POA: Insufficient documentation

## 2022-02-17 DIAGNOSIS — C3411 Malignant neoplasm of upper lobe, right bronchus or lung: Secondary | ICD-10-CM | POA: Insufficient documentation

## 2022-02-17 DIAGNOSIS — C3490 Malignant neoplasm of unspecified part of unspecified bronchus or lung: Secondary | ICD-10-CM | POA: Insufficient documentation

## 2022-02-17 DIAGNOSIS — N281 Cyst of kidney, acquired: Secondary | ICD-10-CM | POA: Diagnosis not present

## 2022-02-17 DIAGNOSIS — C3491 Malignant neoplasm of unspecified part of right bronchus or lung: Secondary | ICD-10-CM

## 2022-02-17 DIAGNOSIS — Z08 Encounter for follow-up examination after completed treatment for malignant neoplasm: Secondary | ICD-10-CM | POA: Insufficient documentation

## 2022-02-17 DIAGNOSIS — N200 Calculus of kidney: Secondary | ICD-10-CM | POA: Diagnosis not present

## 2022-02-17 DIAGNOSIS — Z95828 Presence of other vascular implants and grafts: Secondary | ICD-10-CM

## 2022-02-17 DIAGNOSIS — N2889 Other specified disorders of kidney and ureter: Secondary | ICD-10-CM | POA: Diagnosis not present

## 2022-02-17 DIAGNOSIS — I700 Atherosclerosis of aorta: Secondary | ICD-10-CM | POA: Diagnosis not present

## 2022-02-17 LAB — CMP (CANCER CENTER ONLY)
ALT: 15 U/L (ref 0–44)
AST: 11 U/L — ABNORMAL LOW (ref 15–41)
Albumin: 3.8 g/dL (ref 3.5–5.0)
Alkaline Phosphatase: 55 U/L (ref 38–126)
Anion gap: 6 (ref 5–15)
BUN: 23 mg/dL (ref 8–23)
CO2: 28 mmol/L (ref 22–32)
Calcium: 9.5 mg/dL (ref 8.9–10.3)
Chloride: 102 mmol/L (ref 98–111)
Creatinine: 1.49 mg/dL — ABNORMAL HIGH (ref 0.61–1.24)
GFR, Estimated: 51 mL/min — ABNORMAL LOW (ref >60)
Glucose, Bld: 167 mg/dL — ABNORMAL HIGH (ref 70–99)
Potassium: 4 mmol/L (ref 3.5–5.1)
Sodium: 136 mmol/L (ref 135–145)
Total Bilirubin: 0.3 mg/dL (ref 0.3–1.2)
Total Protein: 6.5 g/dL (ref 6.5–8.1)

## 2022-02-17 LAB — CBC WITH DIFFERENTIAL (CANCER CENTER ONLY)
Abs Immature Granulocytes: 0.04 10*3/uL (ref 0.00–0.07)
Basophils Absolute: 0.1 10*3/uL (ref 0.0–0.1)
Basophils Relative: 1 %
Eosinophils Absolute: 0.6 10*3/uL — ABNORMAL HIGH (ref 0.0–0.5)
Eosinophils Relative: 8 %
HCT: 34.8 % — ABNORMAL LOW (ref 39.0–52.0)
Hemoglobin: 11.6 g/dL — ABNORMAL LOW (ref 13.0–17.0)
Immature Granulocytes: 1 %
Lymphocytes Relative: 30 %
Lymphs Abs: 2.1 10*3/uL (ref 0.7–4.0)
MCH: 31.1 pg (ref 26.0–34.0)
MCHC: 33.3 g/dL (ref 30.0–36.0)
MCV: 93.3 fL (ref 80.0–100.0)
Monocytes Absolute: 0.7 10*3/uL (ref 0.1–1.0)
Monocytes Relative: 10 %
Neutro Abs: 3.6 10*3/uL (ref 1.7–7.7)
Neutrophils Relative %: 50 %
Platelet Count: 165 10*3/uL (ref 150–400)
RBC: 3.73 MIL/uL — ABNORMAL LOW (ref 4.22–5.81)
RDW: 14.8 % (ref 11.5–15.5)
WBC Count: 7 10*3/uL (ref 4.0–10.5)
nRBC: 0 % (ref 0.0–0.2)

## 2022-02-17 LAB — TSH: TSH: 0.999 u[IU]/mL (ref 0.320–4.118)

## 2022-02-17 MED ORDER — SODIUM CHLORIDE 0.9% FLUSH
10.0000 mL | Freq: Once | INTRAVENOUS | Status: AC
  Administered 2022-02-17: 10 mL

## 2022-02-17 MED ORDER — HEPARIN SOD (PORK) LOCK FLUSH 100 UNIT/ML IV SOLN
500.0000 [IU] | Freq: Once | INTRAVENOUS | Status: AC
  Administered 2022-02-17: 500 [IU]

## 2022-02-17 NOTE — Addendum Note (Signed)
Addended by: Bertram Millard A on: 02/17/2022 09:58 AM ? ? Modules accepted: Orders ? ?

## 2022-02-21 ENCOUNTER — Other Ambulatory Visit: Payer: Self-pay

## 2022-02-21 ENCOUNTER — Inpatient Hospital Stay (HOSPITAL_BASED_OUTPATIENT_CLINIC_OR_DEPARTMENT_OTHER): Payer: Medicare PPO | Admitting: Internal Medicine

## 2022-02-21 ENCOUNTER — Inpatient Hospital Stay: Payer: Medicare PPO

## 2022-02-21 VITALS — BP 157/77 | HR 95 | Temp 97.1°F | Resp 19 | Ht 66.0 in | Wt 229.0 lb

## 2022-02-21 DIAGNOSIS — K529 Noninfective gastroenteritis and colitis, unspecified: Secondary | ICD-10-CM | POA: Insufficient documentation

## 2022-02-21 DIAGNOSIS — C3411 Malignant neoplasm of upper lobe, right bronchus or lung: Secondary | ICD-10-CM | POA: Diagnosis not present

## 2022-02-21 DIAGNOSIS — C3490 Malignant neoplasm of unspecified part of unspecified bronchus or lung: Secondary | ICD-10-CM | POA: Diagnosis not present

## 2022-02-21 NOTE — Progress Notes (Signed)
Established patient visit ? ? ?Patient: Kevin Lambert.   DOB: 1955/11/20   66 y.o. Male  MRN: [id number] ?Visit Date: 02/22/2022 ? ? ?Chief Complaint  ?Patient presents with  ? Follow-up  ? ?Subjective  ?  ?HPI  ?The patient is here for routine follow up.  ?-DM 2 - generally well managed - HgbA1c today is 9.2. this is much higher than previous checks over the past year. Currently on jardiance 83m daily  ?-well managed blood pressure ?-treated for chronic lung cancer - was getting immunotherapy infusions. Got problems with diarrhea and high kidney functions. Treatments have been subsequently stopped. Completed 32 out of 35 infusions. Will be monitored more often. Will have repeat scans every 12 weeks for now.  ?-has seen nephrology ?-has seen GI  ? ? ?Medications: ?Outpatient Medications Prior to Visit  ?Medication Sig  ? amLODipine (NORVASC) 10 MG tablet Take 1 tablet (10 mg total) by mouth daily.  ? Ascorbic Acid (VITAMIN C) 1000 MG tablet Take 1,000 mg by mouth daily.  ? aspirin EC 81 MG tablet Take 81 mg by mouth daily.  ? atorvastatin (LIPITOR) 10 MG tablet Take 1 tablet (10 mg total) by mouth daily.  ? carvedilol (COREG) 3.125 MG tablet Take 1 tablet (3.125 mg total) by mouth 2 (two) times daily with a meal.  ? cholecalciferol (VITAMIN D3) 25 MCG (1000 UNIT) tablet Take 1,000 Units by mouth daily.  ? Cyanocobalamin (VITAMIN B 12 PO) Take 1 tablet by mouth daily.  ? empagliflozin (JARDIANCE) 25 MG TABS tablet Take 1 tablet (25 mg total) by mouth daily before breakfast.  ? magic mouthwash SOLN Take 5 mLs by mouth 4 (four) times daily as needed for mouth pain. Swish and swallow or spit  ? meclizine (ANTIVERT) 25 MG tablet TAKE ONE-HALF TABLET BY MOUTH THREE TIMES A DAY AS NEEDED FOR DIZZINESS  ? meclizine (ANTIVERT) 25 MG tablet TAKE ONE-HALF TABLET BY MOUTH THREE TIMES A DAY AS NEEDED FOR DIZZINESS  ? nicotine (NICODERM CQ - DOSED IN MG/24 HOURS) 21 mg/24hr patch Place 1 patch (21 mg total) onto the skin daily.   ? omeprazole (PRILOSEC) 20 MG capsule Take 1 capsule (20 mg total) by mouth daily.  ? prochlorperazine (COMPAZINE) 10 MG tablet Take 1 tablet (10 mg total) by mouth every 6 (six) hours as needed.  ? prochlorperazine (COMPAZINE) 10 MG tablet Take 1 tablet by mouth every 6 (six) hours as needed.  ? Propylene Glycol (SYSTANE BALANCE OP) Place 1 drop into both eyes daily.  ? sildenafil (VIAGRA) 100 MG tablet TAKE ONE TABLET BY MOUTH AS INSTRUCTED (TAKE 1 HOUR PRIOR TO SEXUAL ACTIVITY *DO NOT EXCEED 1 DOSE PER 24 HOUR PERIOD*)  ? triamterene-hydrochlorothiazide (MAXZIDE-25) 37.5-25 MG tablet Take 1 tablet by mouth daily.  ? zinc gluconate 50 MG tablet Take 50 mg by mouth daily.  ? [DISCONTINUED] methocarbamol (ROBAXIN) 500 MG tablet Take 1 tablet (500 mg total) by mouth 2 (two) times daily.  ? ?No facility-administered medications prior to visit.  ? ? ?Review of Systems  ?Constitutional:  Negative for activity change, chills, fatigue and fever.  ?HENT:  Negative for congestion, postnasal drip, rhinorrhea, sinus pressure, sinus pain, sneezing and sore throat.   ?Eyes: Negative.   ?Respiratory:  Positive for wheezing. Negative for cough and shortness of breath.   ?Cardiovascular:  Negative for chest pain and palpitations.  ?Gastrointestinal:  Positive for diarrhea. Negative for constipation, nausea and vomiting.  ?Endocrine: Negative for cold intolerance, heat intolerance, polydipsia  and polyuria.  ?     Very high blood sugars over the past few months.   ?Genitourinary:  Negative for dysuria, frequency and urgency.  ?Musculoskeletal:  Negative for back pain and myalgias.  ?Skin:  Negative for rash.  ?Allergic/Immunologic: Negative for environmental allergies.  ?Neurological:  Negative for dizziness, weakness and headaches.  ?Psychiatric/Behavioral:  The patient is not nervous/anxious.   ? ? ? Objective  ?  ? ?Today's Vitals  ? 02/22/22 1034  ?BP: 121/72  ?Pulse: 77  ?Temp: 97.8 ?F (36.6 ?C)  ?SpO2: 97%  ?Weight: 230 lb  12.8 oz (104.7 kg)  ?Height: 5' 6.14" (1.68 m)  ? ?Body mass index is 37.09 kg/m?.  ? ?BP Readings from Last 3 Encounters:  ?02/22/22 121/72  ?02/21/22 (!) 157/77  ?02/04/22 122/70  ?  ?Wt Readings from Last 3 Encounters:  ?02/22/22 230 lb 12.8 oz (104.7 kg)  ?02/21/22 229 lb (103.9 kg)  ?02/04/22 223 lb (101.2 kg)  ?  ?Physical Exam ?Vitals and nursing note reviewed.  ?Constitutional:   ?   Appearance: Normal appearance. He is well-developed.  ?HENT:  ?   Head: Normocephalic and atraumatic.  ?   Nose: Nose normal.  ?   Mouth/Throat:  ?   Mouth: Mucous membranes are moist.  ?   Pharynx: Oropharynx is clear.  ?Eyes:  ?   Extraocular Movements: Extraocular movements intact.  ?   Conjunctiva/sclera: Conjunctivae normal.  ?   Pupils: Pupils are equal, round, and reactive to light.  ?Cardiovascular:  ?   Rate and Rhythm: Normal rate and regular rhythm.  ?   Pulses: Normal pulses.  ?   Heart sounds: Normal heart sounds.  ?Pulmonary:  ?   Effort: Pulmonary effort is normal.  ?   Breath sounds: Normal breath sounds.  ?   Comments: Breath sounds mildly diminished but clear throughout ?Abdominal:  ?   General: Bowel sounds are normal. There is no distension.  ?   Palpations: Abdomen is soft. There is no mass.  ?   Tenderness: There is no abdominal tenderness. There is no right CVA tenderness, left CVA tenderness, guarding or rebound.  ?   Hernia: No hernia is present.  ?Musculoskeletal:     ?   General: Normal range of motion.  ?   Cervical back: Normal range of motion and neck supple.  ?Lymphadenopathy:  ?   Cervical: No cervical adenopathy.  ?Skin: ?   General: Skin is warm and dry.  ?   Capillary Refill: Capillary refill takes less than 2 seconds.  ?Neurological:  ?   General: No focal deficit present.  ?   Mental Status: He is alert and oriented to person, place, and time.  ?Psychiatric:     ?   Mood and Affect: Mood normal.     ?   Behavior: Behavior normal.     ?   Thought Content: Thought content normal.     ?    Judgment: Judgment normal.  ?  ? ?Results for orders placed or performed in visit on 02/22/22  ?POCT glycosylated hemoglobin (Hb A1C)  ?Result Value Ref Range  ? Hemoglobin A1C 9.2 (A) 4.0 - 5.6 %  ? HbA1c POC (<> result, manual entry)    ? HbA1c, POC (prediabetic range)    ? HbA1c, POC (controlled diabetic range)    ? ? Assessment & Plan  ?  ?1. Type 2 diabetes mellitus with stage 3a chronic kidney disease, without long-term current use of  insulin ([place]) ?HgbA1c very elevated at 9., up from 6.0 at last check. Add ozempic 0.63m weekly. Conitnue jardiance 263mdaily. Prescription for new glucometer with testing supplies sent to his pharmacy. Encouraged him to test once daily, prior to eating. The goal is to get fasting blood sugars under 150. Advised that he being blood sugar logs to next visit in 6 weeks.  ?- POCT glycosylated hemoglobin (Hb A1C) ?- Semaglutide,0.25 or 0.5MG/DOS, (OZEMPIC, 0.25 OR 0.5 MG/DOSE,) 2 MG/1.5ML SOPN; Inject 0.25 mg into the skin once a week.  Dispense: 1.5 mL; Refill: 3 ?- blood glucose meter kit and supplies; Dispense based on patient and insurance preference. Patient to check blood sugars daily, prior to eating and also as needed (FOR ICD-10 E10.9, E11.9).  Dispense: 1 each; Refill: 0 ? ?2. Primary hypertension ?Stable. Continue bp medication as prescribed  ? ?3. Diarrhea, unspecified type ?Gradually improving as he is off immunotherapy infusions longer. Continue to see Gi provider as scheduled.  ? ?4. Abnormal renal function ?Improving since immunotherapy infusions stopped. Continue visits with nephrology as scheduled  ? ?5. Malignant neoplasm of lung, unspecified laterality, unspecified part of lung ([place]?Continue regular visits with oncology as scheduled.  ? ?6. Body mass index (BMI) of 37.0-37.9 in adult ?Encourage patient to limit calorie intake to 2000 cal/day or less.  He should consume a low cholesterol, low-fat diet.   ? ?Problem List Items Addressed This Visit   ? ?  ?  Cardiovascular and Mediastinum  ? Primary hypertension  ?  ? Respiratory  ? Malignant neoplasm of lung ([facility] ?  ? Endocrine  ? DM (diabetes mellitus) ([place]- Primary  ? Relevant Medications  ? Semaglutide,0.25 or 0

## 2022-02-21 NOTE — Progress Notes (Signed)
?    [facility] ?[phone]   [phone] ? ?OFFICE PROGRESS NOTE ? ?[name], NP ?[place] ?[place] [address] ? ?DIAGNOSIS: Stage IV (T2b, N3, M1a) non-small cell lung cancer, adenocarcinoma presented with large right upper lobe lung mass in addition to mediastinal and right supraclavicular lymphadenopathy as well as bilateral pulmonary nodules diagnosed in February 2021. ? ?MOLECULAR STUDY by [employer]: ? ?KRASG12C, 4.3%, Binimetinib ? ?[id number], 0.9%, Niraparib, Olaparib, Rucaparib,Talazoparib, Tazemetostat ? ?[id number], 1.7%, None  ? ?PRIOR THERAPY: Systemic chemotherapy with carboplatin for AUC of 5, Alimta 500 mg/M2 and Keytruda 200 mg IV every 3 weeks.  First dose December 31, 2019.  Status post 31 cycles.  The last few cycles he has been treated with single agent Keytruda secondary to renal insufficiency.  This treatment was discontinued secondary to immunotherapy mediated colitis and diarrhea ? ?CURRENT THERAPY: Observation. ? ?INTERVAL HISTORY: ?Kevin Lambert. 66 y.o. male returns to the clinic today for follow-up visit.  The patient is feeling fine today with no concerning complaints.  He has been in observation for the last few months.  He denied having any current chest pain, shortness of breath, cough or hemoptysis.  He has no nausea, vomiting, but has few episodes of diarrhea with no constipation.  He denied having any headache or visual changes.  He is here today for evaluation with repeat CT scan of the chest, abdomen and pelvis for restaging of his disease. ? ?MEDICAL HISTORY: ?Past Medical History:  ?Diagnosis Date  ? Arthritis   ? Chronic kidney disease   ? Depression   ? DM (diabetes mellitus) ([place])   ? type II  ? Dyslipidemia   ? Dyspnea   ? unable to walk much - he thinks it is from   ? GERD (gastroesophageal reflux disease)   ? Hepatitis   ? Hepatitis C- treated  ? History of kidney stones   ? HTN (hypertension)   ?  Neuropathy   ? nscl ca dx'd 10/2019  ? OSA on CPAP   ? PTSD (post-traumatic stress disorder)   ? ? ?ALLERGIES:  is allergic to metformin and related, naproxen, and oxycontin [oxycodone]. ? ?MEDICATIONS:  ?Current Outpatient Medications  ?Medication Sig Dispense Refill  ? amLODipine (NORVASC) 10 MG tablet Take 1 tablet (10 mg total) by mouth daily. 90 tablet 1  ? Ascorbic Acid (VITAMIN C) 1000 MG tablet Take 1,000 mg by mouth daily.    ? aspirin EC 81 MG tablet Take 81 mg by mouth daily.    ? atorvastatin (LIPITOR) 10 MG tablet Take 1 tablet (10 mg total) by mouth daily. 90 tablet 1  ? carvedilol (COREG) 3.125 MG tablet Take 1 tablet (3.125 mg total) by mouth 2 (two) times daily with a meal. 180 tablet 1  ? cholecalciferol (VITAMIN D3) 25 MCG (1000 UNIT) tablet Take 1,000 Units by mouth daily.    ? Cyanocobalamin (VITAMIN B 12 PO) Take 1 tablet by mouth daily.    ? empagliflozin (JARDIANCE) 25 MG TABS tablet Take 1 tablet (25 mg total) by mouth daily before breakfast. 90 tablet 1  ? magic mouthwash SOLN Take 5 mLs by mouth 4 (four) times daily as needed for mouth pain. Swish and swallow or spit 240 mL 2  ? meclizine (ANTIVERT) 25 MG tablet TAKE ONE-HALF TABLET BY MOUTH THREE TIMES A DAY AS NEEDED FOR DIZZINESS 30 tablet 2  ? meclizine (ANTIVERT) 25 MG tablet TAKE ONE-HALF TABLET  BY MOUTH THREE TIMES A DAY AS NEEDED FOR DIZZINESS    ? methocarbamol (ROBAXIN) 500 MG tablet Take 1 tablet (500 mg total) by mouth 2 (two) times daily. 20 tablet 0  ? nicotine (NICODERM CQ - DOSED IN MG/24 HOURS) 21 mg/24hr patch Place 1 patch (21 mg total) onto the skin daily. 30 patch 1  ? omeprazole (PRILOSEC) 20 MG capsule Take 1 capsule (20 mg total) by mouth daily. 30 capsule 1  ? prochlorperazine (COMPAZINE) 10 MG tablet Take 1 tablet (10 mg total) by mouth every 6 (six) hours as needed. 30 tablet 2  ? prochlorperazine (COMPAZINE) 10 MG tablet Take 1 tablet by mouth every 6 (six) hours as needed.    ? Propylene Glycol (SYSTANE BALANCE  OP) Place 1 drop into both eyes daily.    ? sildenafil (VIAGRA) 100 MG tablet TAKE ONE TABLET BY MOUTH AS INSTRUCTED (TAKE 1 HOUR PRIOR TO SEXUAL ACTIVITY *DO NOT EXCEED 1 DOSE PER 24 HOUR PERIOD*) 10 tablet 5  ? triamterene-hydrochlorothiazide (MAXZIDE-25) 37.5-25 MG tablet Take 1 tablet by mouth daily. 90 tablet 1  ? zinc gluconate 50 MG tablet Take 50 mg by mouth daily.    ? ?No current facility-administered medications for this visit.  ? ? ?SURGICAL HISTORY:  ?Past Surgical History:  ?Procedure Laterality Date  ? BIOPSY OF MEDIASTINAL MASS  12/10/2019  ? Procedure: BIOPSY OF MEDIASTINAL MASS;  Surgeon: Garner Nash, DO;  Location: [facility];  Service: Pulmonary;;  right upper lobe  ? BRONCHIAL NEEDLE ASPIRATION BIOPSY  12/10/2019  ? Procedure: BRONCHIAL NEEDLE ASPIRATION BIOPSIES;  Surgeon: Garner Nash, DO;  Location: [facility];  Service: Pulmonary;;  ? COLONOSCOPY    ? IR IMAGING GUIDED PORT INSERTION  12/31/2019  ? kidney stone    ? URETERAL STENT PLACEMENT    ? Ureteral Stent Removed    ? VIDEO BRONCHOSCOPY WITH ENDOBRONCHIAL ULTRASOUND N/A 12/10/2019  ? Procedure: VIDEO BRONCHOSCOPY WITH ENDOBRONCHIAL ULTRASOUND;  Surgeon: Garner Nash, DO;  Location: [facility];  Service: Pulmonary;  Laterality: N/A;  ? ? ?REVIEW OF SYSTEMS:  A comprehensive review of systems was negative except for: Constitutional: positive for fatigue ?Gastrointestinal: positive for diarrhea  ? ?PHYSICAL EXAMINATION: General appearance: alert, cooperative, and no distress ?Head: Normocephalic, without obvious abnormality, atraumatic ?Neck: no adenopathy, no JVD, supple, symmetrical, trachea midline, and thyroid not enlarged, symmetric, no tenderness/mass/nodules ?Lymph nodes: Cervical, supraclavicular, and axillary nodes normal. ?Resp: clear to auscultation bilaterally ?Back: symmetric, no curvature. ROM normal. No CVA tenderness. ?Cardio: regular rate and rhythm, S1, S2 normal, no murmur, click, rub or gallop ?GI: soft,  non-tender; bowel sounds normal; no masses,  no organomegaly ?Extremities: extremities normal, atraumatic, no cyanosis or edema ? ?ECOG PERFORMANCE STATUS: 1 - Symptomatic but completely ambulatory ? ?Blood pressure (!) 157/77, pulse 95, temperature (!) 97.1 ?F (36.2 ?C), temperature source Tympanic, resp. rate 19, height 5\' 6"  (1.676 m), weight 229 lb (103.9 kg), SpO2 98 %. ? ?LABORATORY DATA: ?Lab Results  ?Component Value Date  ? WBC 7.0 02/17/2022  ? HGB 11.6 (L) 02/17/2022  ? HCT 34.8 (L) 02/17/2022  ? MCV 93.3 02/17/2022  ? PLT 165 02/17/2022  ? ? ?  Chemistry   ?   ?Component Value Date/Time  ? NA 136 02/17/2022 0849  ? NA 138 01/08/2021 0955  ? K 4.0 02/17/2022 0849  ? CL 102 02/17/2022 0849  ? CO2 28 02/17/2022 0849  ? BUN 23 02/17/2022 0849  ? BUN 28 (H) 01/08/2021  4497  ? CREATININE 1.49 (H) 02/17/2022 0849  ?    ?Component Value Date/Time  ? CALCIUM 9.5 02/17/2022 0849  ? ALKPHOS 55 02/17/2022 0849  ? AST 11 (L) 02/17/2022 0849  ? ALT 15 02/17/2022 0849  ? BILITOT 0.3 02/17/2022 0849  ?  ? ? ? ?RADIOGRAPHIC STUDIES: ?CT Abdomen Pelvis Wo Contrast ? ?Result Date: 02/17/2022 ?CLINICAL DATA:  Primary Cancer Type: Lung Imaging Indication: Routine surveillance Interval therapy since last imaging? No Initial Cancer Diagnosis Date: 12/10/2019; Established by: Biopsy-proven Detailed Pathology: Stage IV non-small cell lung cancer, adenocarcinoma. Primary Tumor location:  Right upper lobe. Surgeries: No. Chemotherapy: Yes; Ongoing? No; Most recent administration: 08/24/2020 Immunotherapy?  Yes; Type: Keytruda; Ongoing? No Radiation therapy? No * Tracking Code: BO * EXAM: CT CHEST, ABDOMEN AND PELVIS WITHOUT CONTRAST TECHNIQUE: Multidetector CT imaging of the chest, abdomen and pelvis was performed following the standard protocol without IV contrast. RADIATION DOSE REDUCTION: This exam was performed according to the departmental dose-optimization program which includes automated exposure control, adjustment of the  mA and/or kV according to patient size and/or use of iterative reconstruction technique. COMPARISON:  Most recent CT chest, abdomen and pelvis 10/26/2021. 12/09/2019 PET-CT. FINDINGS: CT CHEST FINDINGS Cardio

## 2022-02-22 ENCOUNTER — Ambulatory Visit (INDEPENDENT_AMBULATORY_CARE_PROVIDER_SITE_OTHER): Payer: No Typology Code available for payment source | Admitting: Nurse Practitioner

## 2022-02-22 ENCOUNTER — Encounter: Payer: Self-pay | Admitting: Nurse Practitioner

## 2022-02-22 VITALS — BP 121/72 | HR 77 | Temp 97.8°F | Ht 66.14 in | Wt 230.8 lb

## 2022-02-22 DIAGNOSIS — Z6837 Body mass index (BMI) 37.0-37.9, adult: Secondary | ICD-10-CM

## 2022-02-22 DIAGNOSIS — N289 Disorder of kidney and ureter, unspecified: Secondary | ICD-10-CM | POA: Diagnosis not present

## 2022-02-22 DIAGNOSIS — C3490 Malignant neoplasm of unspecified part of unspecified bronchus or lung: Secondary | ICD-10-CM

## 2022-02-22 DIAGNOSIS — E1122 Type 2 diabetes mellitus with diabetic chronic kidney disease: Secondary | ICD-10-CM | POA: Diagnosis not present

## 2022-02-22 DIAGNOSIS — N1831 Chronic kidney disease, stage 3a: Secondary | ICD-10-CM

## 2022-02-22 DIAGNOSIS — R197 Diarrhea, unspecified: Secondary | ICD-10-CM | POA: Diagnosis not present

## 2022-02-22 DIAGNOSIS — I10 Essential (primary) hypertension: Secondary | ICD-10-CM | POA: Diagnosis not present

## 2022-02-22 LAB — POCT GLYCOSYLATED HEMOGLOBIN (HGB A1C): Hemoglobin A1C: 9.2 % — AB (ref 4.0–5.6)

## 2022-02-22 MED ORDER — OZEMPIC (0.25 OR 0.5 MG/DOSE) 2 MG/1.5ML ~~LOC~~ SOPN: 0.2500 mg | PEN_INJECTOR | SUBCUTANEOUS | 3 refills | Status: DC

## 2022-02-22 MED ORDER — BLOOD GLUCOSE METER KIT: PACK | 0 refills | Status: AC

## 2022-02-22 NOTE — Progress Notes (Signed)
Started patient on 0.25mg  ozempic weekly. Conitnue jardiance 25mg  daily

## 2022-02-23 ENCOUNTER — Telehealth: Payer: Self-pay | Admitting: Primary Care

## 2022-02-24 NOTE — Telephone Encounter (Signed)
I called this patient and let him know that the splint night was ordered and he did not have any questions. I let him know it could be a few weeks before it could be completed. Nothing further needed.  ?

## 2022-03-01 ENCOUNTER — Telehealth: Payer: Self-pay | Admitting: Primary Care

## 2022-03-02 NOTE — Telephone Encounter (Signed)
I also left Chanel a message that I was faxing the last office note. She was asked to call back with any questions.  ?

## 2022-03-02 NOTE — Telephone Encounter (Signed)
I have printed off the notes and sent them to fax. Nothing further needed. ?

## 2022-04-05 ENCOUNTER — Ambulatory Visit: Payer: Medicare PPO | Admitting: Nurse Practitioner

## 2022-04-10 NOTE — Progress Notes (Signed)
Established patient visit   Patient: Kevin Kunsman Jr.   DOB: 08/27/1956   66 y.o. Male  MRN: [id number] Visit Date: 04/11/2022   Chief Complaint  Patient presents with   Follow-up   Subjective    HPI  Patient presents for routien follow up.  - diabetes - most recent HgbA1c 9.2 after blood sugar had been well controlled. Added ozempic weekly. Need to check blood sugars. Too early to recheck HgbA1c. Has stopped all chemotherapy and immunotherapy since then. Had been given several doses of prednisone to help with diarrhea he was experiencing. He has not started ozempic, since prednisone has also been stopped.  -sleep study has been ordered through pulmonology     Medications: Outpatient Medications Prior to Visit  Medication Sig   amLODipine (NORVASC) 10 MG tablet Take 1 tablet (10 mg total) by mouth daily.   Ascorbic Acid (VITAMIN C) 1000 MG tablet Take 1,000 mg by mouth daily.   aspirin EC 81 MG tablet Take 81 mg by mouth daily.   atorvastatin (LIPITOR) 10 MG tablet Take 1 tablet (10 mg total) by mouth daily.   blood glucose meter kit and supplies Dispense based on patient and insurance preference. Patient to check blood sugars daily, prior to eating and also as needed (FOR ICD-10 E10.9, E11.9).   carvedilol (COREG) 3.125 MG tablet Take 1 tablet (3.125 mg total) by mouth 2 (two) times daily with a meal.   cholecalciferol (VITAMIN D3) 25 MCG (1000 UNIT) tablet Take 1,000 Units by mouth daily.   Cyanocobalamin (VITAMIN B 12 PO) Take 1 tablet by mouth daily.   empagliflozin (JARDIANCE) 25 MG TABS tablet Take 1 tablet (25 mg total) by mouth daily before breakfast.   magic mouthwash SOLN Take 5 mLs by mouth 4 (four) times daily as needed for mouth pain. Swish and swallow or spit   meclizine (ANTIVERT) 25 MG tablet TAKE ONE-HALF TABLET BY MOUTH THREE TIMES A DAY AS NEEDED FOR DIZZINESS   nicotine (NICODERM CQ - DOSED IN MG/24 HOURS) 21 mg/24hr patch Place 1 patch (21 mg total) onto the skin  daily.   omeprazole (PRILOSEC) 20 MG capsule Take 1 capsule (20 mg total) by mouth daily.   prochlorperazine (COMPAZINE) 10 MG tablet Take 1 tablet (10 mg total) by mouth every 6 (six) hours as needed.   Propylene Glycol (SYSTANE BALANCE OP) Place 1 drop into both eyes daily.   Semaglutide,0.25 or 0.5MG/DOS, (OZEMPIC, 0.25 OR 0.5 MG/DOSE,) 2 MG/1.5ML SOPN Inject 0.25 mg into the skin once a week.   sildenafil (VIAGRA) 100 MG tablet TAKE ONE TABLET BY MOUTH AS INSTRUCTED (TAKE 1 HOUR PRIOR TO SEXUAL ACTIVITY *DO NOT EXCEED 1 DOSE PER 24 HOUR PERIOD*)   triamterene-hydrochlorothiazide (MAXZIDE-25) 37.5-25 MG tablet Take 1 tablet by mouth daily.   zinc gluconate 50 MG tablet Take 50 mg by mouth daily.   [DISCONTINUED] meclizine (ANTIVERT) 25 MG tablet TAKE ONE-HALF TABLET BY MOUTH THREE TIMES A DAY AS NEEDED FOR DIZZINESS (Patient not taking: Reported on 04/11/2022)   [DISCONTINUED] prochlorperazine (COMPAZINE) 10 MG tablet Take 1 tablet by mouth every 6 (six) hours as needed.   No facility-administered medications prior to visit.    Review of Systems  Constitutional:  Positive for fatigue. Negative for activity change, chills and fever.  HENT:  Negative for congestion, postnasal drip, rhinorrhea, sinus pressure, sinus pain, sneezing and sore throat.   Eyes: Negative.   Respiratory:  Positive for shortness of breath. Negative for cough and wheezing.     Cardiovascular:  Negative for chest pain and palpitations.  Gastrointestinal:  Negative for constipation, diarrhea, nausea and vomiting.       Patient improving diarrhea and cramping.  Endocrine: Negative for cold intolerance, heat intolerance, polydipsia and polyuria.       Uncontrolled blood sugars.  Genitourinary:  Negative for dysuria, frequency and urgency.  Musculoskeletal:  Negative for back pain and myalgias.  Skin:  Negative for rash.  Allergic/Immunologic: Negative for environmental allergies.  Neurological:  Negative for dizziness,  weakness and headaches.  Psychiatric/Behavioral:  The patient is not nervous/anxious.       Objective    BP 135/78   Pulse 94   Temp (!) 97.3 F (36.3 C)   Ht 5' 6.14" (1.68 m)   Wt 230 lb 1.9 oz (104.4 kg)   SpO2 96%   BMI 36.98 kg/m  BP Readings from Last 3 Encounters:  04/11/22 135/78  02/22/22 121/72  02/21/22 (!) 157/77    Wt Readings from Last 3 Encounters:  04/11/22 230 lb 1.9 oz (104.4 kg)  02/22/22 230 lb 12.8 oz (104.7 kg)  02/21/22 229 lb (103.9 kg)    Physical Exam Vitals and nursing note reviewed.  Constitutional:      Appearance: Normal appearance. He is well-developed.  HENT:     Head: Normocephalic and atraumatic.     Nose: Nose normal.     Mouth/Throat:     Mouth: Mucous membranes are moist.     Pharynx: Oropharynx is clear.  Eyes:     Extraocular Movements: Extraocular movements intact.     Conjunctiva/sclera: Conjunctivae normal.     Pupils: Pupils are equal, round, and reactive to light.  Neck:     Vascular: No carotid bruit.  Cardiovascular:     Rate and Rhythm: Normal rate and regular rhythm.     Pulses: Normal pulses.     Heart sounds: Normal heart sounds.  Pulmonary:     Effort: Pulmonary effort is normal.     Breath sounds: Normal breath sounds.     Comments: Breath sounds are diminished though clear throughout. Abdominal:     Palpations: Abdomen is soft.  Musculoskeletal:        General: Normal range of motion.     Cervical back: Normal range of motion and neck supple.  Lymphadenopathy:     Cervical: No cervical adenopathy.  Skin:    General: Skin is warm and dry.     Capillary Refill: Capillary refill takes less than 2 seconds.  Neurological:     General: No focal deficit present.     Mental Status: He is alert and oriented to person, place, and time.  Psychiatric:        Mood and Affect: Mood normal.        Behavior: Behavior normal.        Thought Content: Thought content normal.        Judgment: Judgment normal.      Results for orders placed or performed in visit on 04/11/22  POCT glycosylated hemoglobin (Hb A1C)  Result Value Ref Range   Hemoglobin A1C 6.8 (A) 4.0 - 5.6 %   HbA1c POC (<> result, manual entry)     HbA1c, POC (prediabetic range)     HbA1c, POC (controlled diabetic range)       Assessment & Plan    1. Type 2 diabetes mellitus with stage 3a chronic kidney disease, without long-term current use of insulin (HCC) Hemoglobin A1c back to 6.8.  Patient   admitting to not taking Ozempic.  Continue Jardiance as previously prescribed.  Limit intake of carbohydrates and sugar.  Encouraged him to check blood sugars daily prior to eating.  The goal is a fasting blood sugar between 70 and 100.  Recheck hemoglobin A1c in 3 months. - POCT glycosylated hemoglobin (Hb A1C)  2. Contact dermatitis, unspecified contact dermatitis type, unspecified trigger New prescription for triamcinolone 0.5% cream may be applied to to affected areas twice daily as needed. - triamcinolone cream (KENALOG) 0.5 %; Apply 1 application. topically 2 (two) times daily.  Dispense: 454 g; Refill: 1  3. Malignant neoplasm of lung, unspecified laterality, unspecified part of lung (HCC) Continue routine visits with oncology as scheduled.  4. Diarrhea, unspecified type Gradually resolving.  He should continue regular visits with GI as scheduled.   Problem List Items Addressed This Visit       Respiratory   Malignant neoplasm of lung (HCC)     Endocrine   DM (diabetes mellitus) (HCC) - Primary   Relevant Orders   POCT glycosylated hemoglobin (Hb A1C) (Completed)     Musculoskeletal and Integument   Contact dermatitis   Relevant Medications   triamcinolone cream (KENALOG) 0.5 %     Other   Diarrhea     Return in about 3 months (around 07/12/2022) for medicare wellness, check HgbA1c.         Heather E Boscia, NP  [facility] [phone] (phone) [phone] (fax)  [facility]  

## 2022-04-11 ENCOUNTER — Encounter: Payer: Self-pay | Admitting: Nurse Practitioner

## 2022-04-11 ENCOUNTER — Ambulatory Visit (INDEPENDENT_AMBULATORY_CARE_PROVIDER_SITE_OTHER): Payer: Medicare PPO | Admitting: Nurse Practitioner

## 2022-04-11 VITALS — BP 135/78 | HR 94 | Temp 97.3°F | Ht 66.14 in | Wt 230.1 lb

## 2022-04-11 DIAGNOSIS — R197 Diarrhea, unspecified: Secondary | ICD-10-CM | POA: Diagnosis not present

## 2022-04-11 DIAGNOSIS — C3490 Malignant neoplasm of unspecified part of unspecified bronchus or lung: Secondary | ICD-10-CM | POA: Diagnosis not present

## 2022-04-11 DIAGNOSIS — L259 Unspecified contact dermatitis, unspecified cause: Secondary | ICD-10-CM

## 2022-04-11 DIAGNOSIS — N1831 Chronic kidney disease, stage 3a: Secondary | ICD-10-CM

## 2022-04-11 DIAGNOSIS — E1122 Type 2 diabetes mellitus with diabetic chronic kidney disease: Secondary | ICD-10-CM | POA: Diagnosis not present

## 2022-04-11 LAB — POCT GLYCOSYLATED HEMOGLOBIN (HGB A1C): Hemoglobin A1C: 6.8 % — AB (ref 4.0–5.6)

## 2022-04-11 MED ORDER — TRIAMCINOLONE ACETONIDE 0.5 % EX CREA: 1.0000 "application " | TOPICAL_CREAM | Freq: Two times a day (BID) | CUTANEOUS | 1 refills | Status: DC

## 2022-04-17 DIAGNOSIS — L259 Unspecified contact dermatitis, unspecified cause: Secondary | ICD-10-CM | POA: Insufficient documentation

## 2022-04-18 ENCOUNTER — Telehealth: Payer: Self-pay | Admitting: Internal Medicine

## 2022-04-18 NOTE — Telephone Encounter (Signed)
Called patient regarding upcoming July appointment, patient is notified.

## 2022-04-25 ENCOUNTER — Telehealth: Payer: Self-pay | Admitting: Internal Medicine

## 2022-04-25 NOTE — Telephone Encounter (Signed)
Called patient regarding upcoming July appointment, patient is notified.

## 2022-04-29 ENCOUNTER — Ambulatory Visit (HOSPITAL_BASED_OUTPATIENT_CLINIC_OR_DEPARTMENT_OTHER): Payer: No Typology Code available for payment source | Attending: Primary Care | Admitting: Pulmonary Disease

## 2022-04-29 DIAGNOSIS — G4733 Obstructive sleep apnea (adult) (pediatric): Secondary | ICD-10-CM | POA: Diagnosis not present

## 2022-05-03 ENCOUNTER — Telehealth: Payer: Self-pay | Admitting: Medical Oncology

## 2022-05-05 ENCOUNTER — Telehealth: Payer: Self-pay | Admitting: Primary Care

## 2022-05-05 DIAGNOSIS — G4733 Obstructive sleep apnea (adult) (pediatric): Secondary | ICD-10-CM | POA: Diagnosis not present

## 2022-05-05 NOTE — Procedures (Signed)
Patient Name: Kevin Lambert, Kevin Lambert Date: 04/29/2022 Gender: Male D.O.B: 1956-08-27 Age (years): 64 Referring Provider: Geraldo Pitter NP Height (inches): 67 Interpreting Physician: Kara Mead MD, ABSM Weight (lbs): 230 RPSGT: Baxter Flattery BMI: 36 MRN: [id number] Neck Size: 17.00 <br> <br> CLINICAL INFORMATION The patient is referred for a split night study to reassess OSA. MEDICATIONS Medications self-administered by patient taken the night of the study : N/A  SLEEP STUDY TECHNIQUE As per the AASM Manual for the Scoring of Sleep and Associated Events v2.3 (April 2016) with a hypopnea requiring 4% desaturations.  The channels recorded and monitored were frontal, central and occipital EEG, electrooculogram (EOG), submentalis EMG (chin), nasal and oral airflow, thoracic and abdominal wall motion, anterior tibialis EMG, snore microphone, electrocardiogram, and pulse oximetry. Bi-level positive airway pressure (BiPAP) was initiated when the patient met split night criteria and was titrated according to treat sleep-disordered breathing.  RESPIRATORY PARAMETERS Diagnostic  Total AHI (/hr): 69.9 RDI (/hr): 69.9 OA Index (/hr): 5.9 CA Index (/hr): 4.6 REM AHI (/hr): 61.4 NREM AHI (/hr): 73.4 Supine AHI (/hr): 75.6 Non-supine AHI (/hr): 69.30 Min O2 Sat (%): 64.0 Mean O2 (%): 91.6 Time below 88% (min): 30   Titration  Optimal IPAP Pressure (cm): 21 Optimal EPAP Pressure (cm): 17 AHI at Optimal Pressure (/hr): 0 Min O2 at Optimal Pressure (%): 96.0 Sleep % at Optimal (%): 92 Supine % at Optimal (%): 0     SLEEP ARCHITECTURE The study was initiated at 10:02:00 PM and terminated at 4:42:47 AM. The total recorded time was 400.8 minutes. EEG confirmed total sleep time was 345 minutes yielding a sleep efficiency of 86.1%%. Sleep onset after lights out was 0.9 minutes with a REM latency of 59.5 minutes. The patient spent 6.8%% of the night in stage N1 sleep, 60.7%% in stage N2 sleep, 0.0%% in  stage N3 and 32.5% in REM. Wake after sleep onset (WASO) was 54.9 minutes. The Arousal Index was 2.1/hour.  LEG MOVEMENT DATA The total Periodic Limb Movements of Sleep (PLMS) were 0. The PLMS index was 0.0 .  CARDIAC DATA The 2 lead EKG demonstrated sinus rhythm. The mean heart rate was 100.0 beats per minute. Other EKG findings include: None.   IMPRESSIONS - Severe obstructive sleep apnea occurred during the diagnostic portion of the study (AHI = 69.9 /hour). Central apneas emerged at CPAP 11 cm & persisted on biPAP. An optimal PAP pressure was selected for this patient ( 21 / 17cm of water) - Moderate central sleep apnea occurred during the diagnostic portion of the study (CAI = 4.6/hour). - Severe oxygen desaturation was noted during the diagnostic portion of the study (Min O2 = 64.0%). - No snoring was audible during this study. - No cardiac abnormalities were noted during this study. - Clinically significant periodic limb movements of sleep did not occur during the study.   DIAGNOSIS - Obstructive Sleep Apnea (G47.33) - Treatment emergent central apneas    RECOMMENDATIONS - Trial of BiPAP therapy on 21/17 cm H2O with a Medium size Resmed Nasal Pillow AirFit P30 mask and heated humidification. Alternatively, autoBiPAP can be used - Avoid alcohol, sedatives and other CNS depressants that may worsen sleep apnea and disrupt normal sleep architecture. - Sleep hygiene should be reviewed to assess factors that may improve sleep quality. - Weight management and regular exercise should be initiated or continued. - Return to [facility] for re-evaluation. Review download to confirm that central apneas subside with time   Kara Mead MD Board Certified in  Sleep medicine

## 2022-05-05 NOTE — Telephone Encounter (Signed)
Split-night sleep study on 04/29/2022 showed severe obstructive sleep apnea.  He had on average 69 events an hour. Recommend patient be started on BiPAP therapy.  Patient needs overview in person or virtual ok to discuss sleep study results and recommended treatment in further detail.

## 2022-05-05 NOTE — Telephone Encounter (Signed)
Called and spoke with pt letting him know the results of sleep study and he verbalized understanding. Pt has been scheduled for an appt with BW to further discuss the results. Nothing further needed.

## 2022-05-09 ENCOUNTER — Other Ambulatory Visit: Payer: Self-pay | Admitting: Nurse Practitioner

## 2022-05-09 DIAGNOSIS — L259 Unspecified contact dermatitis, unspecified cause: Secondary | ICD-10-CM

## 2022-05-09 MED ORDER — TRIAMCINOLONE ACETONIDE 0.5 % EX CREA: 1.0000 | TOPICAL_CREAM | Freq: Two times a day (BID) | CUTANEOUS | 1 refills | Status: DC

## 2022-05-19 ENCOUNTER — Inpatient Hospital Stay: Payer: Medicare PPO | Attending: Internal Medicine

## 2022-05-19 ENCOUNTER — Other Ambulatory Visit: Payer: Self-pay

## 2022-05-19 ENCOUNTER — Inpatient Hospital Stay: Payer: Medicare PPO

## 2022-05-19 ENCOUNTER — Ambulatory Visit (HOSPITAL_COMMUNITY)
Admission: RE | Admit: 2022-05-19 | Discharge: 2022-05-19 | Disposition: A | Discharge status: Home / Self Care | Payer: No Typology Code available for payment source | Source: Ambulatory Visit | Attending: Internal Medicine | Admitting: Internal Medicine

## 2022-05-19 DIAGNOSIS — C3490 Malignant neoplasm of unspecified part of unspecified bronchus or lung: Secondary | ICD-10-CM | POA: Diagnosis present

## 2022-05-19 DIAGNOSIS — Z08 Encounter for follow-up examination after completed treatment for malignant neoplasm: Secondary | ICD-10-CM | POA: Diagnosis not present

## 2022-05-19 DIAGNOSIS — N200 Calculus of kidney: Secondary | ICD-10-CM | POA: Diagnosis not present

## 2022-05-19 DIAGNOSIS — Z85118 Personal history of other malignant neoplasm of bronchus and lung: Secondary | ICD-10-CM | POA: Insufficient documentation

## 2022-05-19 DIAGNOSIS — R911 Solitary pulmonary nodule: Secondary | ICD-10-CM | POA: Diagnosis not present

## 2022-05-19 DIAGNOSIS — N281 Cyst of kidney, acquired: Secondary | ICD-10-CM | POA: Diagnosis not present

## 2022-05-19 LAB — CMP (CANCER CENTER ONLY)
ALT: 37 U/L (ref 0–44)
AST: 20 U/L (ref 15–41)
Albumin: 4.3 g/dL (ref 3.5–5.0)
Alkaline Phosphatase: 51 U/L (ref 38–126)
Anion gap: 8 (ref 5–15)
BUN: 56 mg/dL — ABNORMAL HIGH (ref 8–23)
CO2: 23 mmol/L (ref 22–32)
Calcium: 11 mg/dL — ABNORMAL HIGH (ref 8.9–10.3)
Chloride: 104 mmol/L (ref 98–111)
Creatinine: 2.25 mg/dL — ABNORMAL HIGH (ref 0.61–1.24)
GFR, Estimated: 31 mL/min — ABNORMAL LOW (ref >60)
Glucose, Bld: 146 mg/dL — ABNORMAL HIGH (ref 70–99)
Potassium: 4.2 mmol/L (ref 3.5–5.1)
Sodium: 135 mmol/L (ref 135–145)
Total Bilirubin: 0.2 mg/dL — ABNORMAL LOW (ref 0.3–1.2)
Total Protein: 7.1 g/dL (ref 6.5–8.1)

## 2022-05-19 LAB — CBC WITH DIFFERENTIAL (CANCER CENTER ONLY)
Abs Immature Granulocytes: 0.02 10*3/uL (ref 0.00–0.07)
Basophils Absolute: 0.1 10*3/uL (ref 0.0–0.1)
Basophils Relative: 1 %
Eosinophils Absolute: 0.4 10*3/uL (ref 0.0–0.5)
Eosinophils Relative: 8 %
HCT: 42.3 % (ref 39.0–52.0)
Hemoglobin: 14.4 g/dL (ref 13.0–17.0)
Immature Granulocytes: 0 %
Lymphocytes Relative: 35 %
Lymphs Abs: 2 10*3/uL (ref 0.7–4.0)
MCH: 30.6 pg (ref 26.0–34.0)
MCHC: 34 g/dL (ref 30.0–36.0)
MCV: 90 fL (ref 80.0–100.0)
Monocytes Absolute: 0.6 10*3/uL (ref 0.1–1.0)
Monocytes Relative: 10 %
Neutro Abs: 2.7 10*3/uL (ref 1.7–7.7)
Neutrophils Relative %: 46 %
Platelet Count: 188 10*3/uL (ref 150–400)
RBC: 4.7 MIL/uL (ref 4.22–5.81)
RDW: 14.6 % (ref 11.5–15.5)
WBC Count: 5.9 10*3/uL (ref 4.0–10.5)
nRBC: 0 % (ref 0.0–0.2)

## 2022-05-23 ENCOUNTER — Inpatient Hospital Stay (HOSPITAL_BASED_OUTPATIENT_CLINIC_OR_DEPARTMENT_OTHER): Payer: Medicare PPO | Admitting: Internal Medicine

## 2022-05-23 ENCOUNTER — Other Ambulatory Visit: Payer: Self-pay

## 2022-05-23 ENCOUNTER — Encounter: Payer: Self-pay | Admitting: Primary Care

## 2022-05-23 ENCOUNTER — Inpatient Hospital Stay: Payer: Medicare PPO

## 2022-05-23 ENCOUNTER — Ambulatory Visit (INDEPENDENT_AMBULATORY_CARE_PROVIDER_SITE_OTHER): Payer: No Typology Code available for payment source | Admitting: Primary Care

## 2022-05-23 ENCOUNTER — Ambulatory Visit: Payer: No Typology Code available for payment source | Admitting: Primary Care

## 2022-05-23 VITALS — BP 124/64 | HR 70 | Temp 98.7°F | Ht 66.0 in | Wt 228.7 lb

## 2022-05-23 VITALS — BP 134/67 | HR 66 | Temp 98.3°F | Resp 15 | Wt 227.8 lb

## 2022-05-23 DIAGNOSIS — C3491 Malignant neoplasm of unspecified part of right bronchus or lung: Secondary | ICD-10-CM | POA: Diagnosis not present

## 2022-05-23 DIAGNOSIS — C3490 Malignant neoplasm of unspecified part of unspecified bronchus or lung: Secondary | ICD-10-CM | POA: Diagnosis not present

## 2022-05-23 DIAGNOSIS — G4730 Sleep apnea, unspecified: Secondary | ICD-10-CM

## 2022-05-23 DIAGNOSIS — Z85118 Personal history of other malignant neoplasm of bronchus and lung: Secondary | ICD-10-CM | POA: Diagnosis not present

## 2022-05-23 DIAGNOSIS — Z08 Encounter for follow-up examination after completed treatment for malignant neoplasm: Secondary | ICD-10-CM | POA: Diagnosis not present

## 2022-05-23 DIAGNOSIS — G4733 Obstructive sleep apnea (adult) (pediatric): Secondary | ICD-10-CM | POA: Diagnosis not present

## 2022-05-23 NOTE — Assessment & Plan Note (Signed)
-   Hx sleep apnea, stopped wearing CPAP > 5 years ago. Patient has symptoms of snoring and disrupted sleep.  Epworth score 18.  Split-night sleep study on 04/29/2022 showed evidence of severe obstructive sleep apnea, AHI 69.9/hour with SPO2 low 64%. Moderare central sleep apnea.  Optimal PAP pressure 21/17 centimeters H2O with medium size Resmed nasal pillow AirFIT P30 mask.  Reviewed treatment options with patient, he is open to starting BiPAP therapy.  We have placed an order for patient to receive new BiPAP machine with local DME company at recommended pressure settings. Recommend patient aim to wear BiPAP every night for minimum 4 to 6 hours or longer.  Encouraged weight loss efforts.  Advised against driving experiencing excessive daytime sleepiness fatigue.  Follow-up in 31 to 90 days after starting BiPAP therapy for compliance check.

## 2022-05-23 NOTE — Progress Notes (Signed)
'@Patient'  ID: Kevin Flavin., male    DOB: [birth year], 66 y.o.   MRN: [id number]  Chief Complaint  Patient presents with   Follow-up    Follow up on sleep study results.     Referring provider: [name], NP  HPI: 66 year old male. PMH significant for HTN, atherosclerosis, mediastinal adenopathy, non-small cell lung cancer, OSA, type 2 diabetes, hyperlipidemia.   Previous [facility] pulmonary encounter: 01/31/2022 Patient presents today for sleep consult. Patient has a history of sleep apnea, his last sleep study was in 2007 (results not available in chart). He was on CPAP therapy but stopped wearing about 5 years ago d/t life circumstance. He continues to have symptoms of loud snoring and disrupted sleep. He wakes up on average three times at night. He has associated daytime sleepiness. Epwoth score 18. Denies narcolepsy, cataplexy, sleep walking.   Sleep questionnaire Symptoms-  Snoring, disrupted sleep  Prior sleep study- Yes, > 10 years ago  Bedtime- 11pm-12am Time to fall asleep- 10 mins Nocturnal awakenings- 3 Out of bed/start of day- 9am Weight changes- Increased steadily  Do you operate heavy machinery- No Do you currently wear CPAP- No Do you current wear oxygen- No Epworth- 18  05/23/2022- Interim hx  Patient presents today for follow-up to review sleep study results.  Patient was seen for initial sleep consult back in March.  He had symptoms of snoring and disrupted sleep.  Epworth score was 18.  Patient had  split night sleep study on 04/29/2022 that showed severe obstructive sleep apnea with severe oxygen desaturation, AHI 69.9/hr, SPO2 low 64% (average 91.9%).  Moderate central sleep apnea (CAI 4.6 an hour). Optimal PAP pressure 21/17 cm H2O. Reviewed sleep study results with patient and risks of untreated sleep apnea.  He is open to starting BiPAP therapy.    Allergies  Allergen Reactions   Metformin And Related Other (See Comments)    Severe muscle spasms    Naproxen Other (See Comments)    Pt states it have him off balance.   Oxycontin [Oxycodone] Hives    Immunization History  Administered Date(s) Administered   Hep A / Hep B 01/06/2016, 02/05/2016, 07/13/2016   Influenza Split 09/04/2014   Influenza-Unspecified 07/11/2007, 08/27/2013, 09/04/2014   Moderna Sars-Covid-2 Vaccination 01/03/2020, 12/31/2020   PFIZER(Purple Top)SARS-COV-2 Vaccination 11/20/2020, 12/11/2020   Pneumococcal-Unspecified 05/21/2020   Tdap 07/13/2017   Zoster Recombinat (Shingrix) 09/22/2020, 04/28/2021    Past Medical History:  Diagnosis Date   Arthritis    Chronic kidney disease    Depression    DM (diabetes mellitus) ([place])    type II   Dyslipidemia    Dyspnea    unable to walk much - he thinks it is from    GERD (gastroesophageal reflux disease)    Hepatitis    Hepatitis C- treated   History of kidney stones    HTN (hypertension)    Neuropathy    nscl ca dx'd 10/2019   OSA on CPAP    PTSD (post-traumatic stress disorder)     Tobacco History: Social History   Tobacco Use  Smoking Status Some Days   Types: Cigars   Passive exposure: Past  Smokeless Tobacco Never  Tobacco Comments   05/23/22. : 1-2 Black & Milds / LW   Ready to quit: Not Answered Counseling given: Not Answered Tobacco comments: 05/23/22. : 1-2 Black & Milds / LW   Outpatient Medications Prior to Visit  Medication Sig Dispense Refill   amLODipine (NORVASC) 10  MG tablet Take 1 tablet (10 mg total) by mouth daily. 90 tablet 1   Ascorbic Acid (VITAMIN C) 1000 MG tablet Take 1,000 mg by mouth daily.     aspirin EC 81 MG tablet Take 81 mg by mouth daily.     atorvastatin (LIPITOR) 10 MG tablet Take 1 tablet (10 mg total) by mouth daily. 90 tablet 1   blood glucose meter kit and supplies Dispense based on patient and insurance preference. Patient to check blood sugars daily, prior to eating and also as needed (FOR ICD-10 E10.9, E11.9). 1 each 0   carvedilol (COREG) 3.125 MG  tablet Take 1 tablet (3.125 mg total) by mouth 2 (two) times daily with a meal. 180 tablet 1   cholecalciferol (VITAMIN D3) 25 MCG (1000 UNIT) tablet Take 1,000 Units by mouth daily.     Cyanocobalamin (VITAMIN B 12 PO) Take 1 tablet by mouth daily.     empagliflozin (JARDIANCE) 25 MG TABS tablet Take 1 tablet (25 mg total) by mouth daily before breakfast. 90 tablet 1   magic mouthwash SOLN Take 5 mLs by mouth 4 (four) times daily as needed for mouth pain. Swish and swallow or spit 240 mL 2   meclizine (ANTIVERT) 25 MG tablet TAKE ONE-HALF TABLET BY MOUTH THREE TIMES A DAY AS NEEDED FOR DIZZINESS     nicotine (NICODERM CQ - DOSED IN MG/24 HOURS) 21 mg/24hr patch Place 1 patch (21 mg total) onto the skin daily. 30 patch 1   omeprazole (PRILOSEC) 20 MG capsule Take 1 capsule (20 mg total) by mouth daily. 30 capsule 1   prochlorperazine (COMPAZINE) 10 MG tablet Take 1 tablet (10 mg total) by mouth every 6 (six) hours as needed. 30 tablet 2   Propylene Glycol (SYSTANE BALANCE OP) Place 1 drop into both eyes daily.     Semaglutide,0.25 or 0.5MG/DOS, (OZEMPIC, 0.25 OR 0.5 MG/DOSE,) 2 MG/1.5ML SOPN Inject 0.25 mg into the skin once a week. 1.5 mL 3   sildenafil (VIAGRA) 100 MG tablet TAKE ONE TABLET BY MOUTH AS INSTRUCTED (TAKE 1 HOUR PRIOR TO SEXUAL ACTIVITY *DO NOT EXCEED 1 DOSE PER 24 HOUR PERIOD*) 10 tablet 5   triamcinolone cream (KENALOG) 0.5 % Apply 1 Application topically 2 (two) times daily. 454 g 1   triamterene-hydrochlorothiazide (MAXZIDE-25) 37.5-25 MG tablet Take 1 tablet by mouth daily. 90 tablet 1   zinc gluconate 50 MG tablet Take 50 mg by mouth daily.     No facility-administered medications prior to visit.   Review of Systems  Review of Systems  Constitutional: Negative.  Negative for fatigue.  HENT: Negative.    Respiratory:  Positive for apnea.   Psychiatric/Behavioral:  Positive for sleep disturbance.    Physical Exam  BP 124/64 (BP Location: Right Arm, Patient Position:  Sitting, Cuff Size: Normal)   Pulse 70   Temp 98.7 F (37.1 C) (Oral)   Ht '5\' 6"'  (1.676 m)   Wt 228 lb 10.6 oz (103.7 kg)   SpO2 98%   BMI 36.91 kg/m  Physical Exam Constitutional:      Appearance: Normal appearance.  HENT:     Head: Normocephalic and atraumatic.  Cardiovascular:     Rate and Rhythm: Normal rate.  Pulmonary:     Effort: Pulmonary effort is normal.  Musculoskeletal:        General: Normal range of motion.  Skin:    General: Skin is warm and dry.  Neurological:     General: No focal deficit  present.     Mental Status: He is alert and oriented to person, place, and time. Mental status is at baseline.  Psychiatric:        Mood and Affect: Mood normal.        Behavior: Behavior normal.        Thought Content: Thought content normal.        Judgment: Judgment normal.      Lab Results:  CBC    Component Value Date/Time   WBC 5.9 05/19/2022 0856   WBC 6.4 06/03/2021 1123   RBC 4.70 05/19/2022 0856   HGB 14.4 05/19/2022 0856   HCT 42.3 05/19/2022 0856   PLT 188 05/19/2022 0856   MCV 90.0 05/19/2022 0856   MCH 30.6 05/19/2022 0856   MCHC 34.0 05/19/2022 0856   RDW 14.6 05/19/2022 0856   LYMPHSABS 2.0 05/19/2022 0856   MONOABS 0.6 05/19/2022 0856   EOSABS 0.4 05/19/2022 0856   BASOSABS 0.1 05/19/2022 0856    BMET    Component Value Date/Time   NA 135 05/19/2022 0856   NA 138 01/08/2021 0955   K 4.2 05/19/2022 0856   CL 104 05/19/2022 0856   CO2 23 05/19/2022 0856   GLUCOSE 146 (H) 05/19/2022 0856   BUN 56 (H) 05/19/2022 0856   BUN 28 (H) 01/08/2021 0955   CREATININE 2.25 (H) 05/19/2022 0856   CALCIUM 11.0 (H) 05/19/2022 0856   GFRNONAA 31 (L) 05/19/2022 0856   GFRAA 58 (L) 08/10/2020 1012    BNP    Component Value Date/Time   BNP 47.4 09/24/2020 1429    ProBNP No results found for: "PROBNP"  Imaging: CT Chest Wo Contrast  Result Date: 05/20/2022 CLINICAL DATA:  Patient with history of non-small cell lung cancer. Follow-up exam.  * Tracking Code: BO * EXAM: CT CHEST, ABDOMEN AND PELVIS WITHOUT CONTRAST TECHNIQUE: Multidetector CT imaging of the chest, abdomen and pelvis was performed following the standard protocol without IV contrast. RADIATION DOSE REDUCTION: This exam was performed according to the departmental dose-optimization program which includes automated exposure control, adjustment of the mA and/or kV according to patient size and/or use of iterative reconstruction technique. COMPARISON:  CT C AP February 17, 2022 FINDINGS: CT CHEST FINDINGS Cardiovascular: Right anterior chest wall Port-A-Cath is present with tip terminating in the superior vena cava. Normal heart size. Thoracic aortic and coronary arterial vascular calcifications. Mediastinum/Nodes: No enlarged axillary, mediastinal or hilar lymphadenopathy. Normal appearance of the esophagus. Lungs/Pleura: Central airways are patent. Similar-appearing 2.6 x 1.4 cm irregular nodule within the right upper lobe adjacent to the fissure (image 62; series 4). No pleural effusion or pneumothorax. Musculoskeletal: No aggressive or acute appearing osseous lesions. Thoracic spine degenerative changes. CT ABDOMEN PELVIS FINDINGS Hepatobiliary: The liver is normal in size and contour. Gallbladder is unremarkable. No intrahepatic or extrahepatic biliary ductal dilatation. Pancreas: Unremarkable Spleen: Unremarkable Adrenals/Urinary Tract: Stable 2.3 cm right adrenal nodule compatible with adenoma. No follow-up needed. Stable mildly thickened left adrenal gland. Unchanged 3 mm stone inferior pole left kidney. Unchanged 2 cm cyst right upper pole. Unchanged subcentimeter too small to characterize exophytic lesion superior pole left kidney. Urinary bladder is unremarkable. Stomach/Bowel: No abnormal bowel wall thickening or evidence for bowel obstruction. No free fluid or free intraperitoneal air. Normal appendix. Normal morphology of the stomach. Vascular/Lymphatic: Normal caliber abdominal  aorta. Peripheral calcified atherosclerotic plaque. Reproductive: Heterogeneous prostate. Other: None. Musculoskeletal: No aggressive or acute appearing osseous lesions. Lumbar spine degenerative changes. IMPRESSION: Unchanged spiculated posterior right upper  lobe pulmonary nodule. No recurrent thoracic adenopathy or progressive metastatic disease in the chest, abdomen or pelvis. Aortic Atherosclerosis (ICD10-I70.0) and Emphysema (ICD10-J43.9). Electronically Signed   By: Lovey Newcomer M.D.   On: 05/20/2022 13:43   CT Abdomen Pelvis Wo Contrast  Result Date: 05/20/2022 CLINICAL DATA:  Patient with history of non-small cell lung cancer. Follow-up exam. * Tracking Code: BO * EXAM: CT CHEST, ABDOMEN AND PELVIS WITHOUT CONTRAST TECHNIQUE: Multidetector CT imaging of the chest, abdomen and pelvis was performed following the standard protocol without IV contrast. RADIATION DOSE REDUCTION: This exam was performed according to the departmental dose-optimization program which includes automated exposure control, adjustment of the mA and/or kV according to patient size and/or use of iterative reconstruction technique. COMPARISON:  CT C AP February 17, 2022 FINDINGS: CT CHEST FINDINGS Cardiovascular: Right anterior chest wall Port-A-Cath is present with tip terminating in the superior vena cava. Normal heart size. Thoracic aortic and coronary arterial vascular calcifications. Mediastinum/Nodes: No enlarged axillary, mediastinal or hilar lymphadenopathy. Normal appearance of the esophagus. Lungs/Pleura: Central airways are patent. Similar-appearing 2.6 x 1.4 cm irregular nodule within the right upper lobe adjacent to the fissure (image 62; series 4). No pleural effusion or pneumothorax. Musculoskeletal: No aggressive or acute appearing osseous lesions. Thoracic spine degenerative changes. CT ABDOMEN PELVIS FINDINGS Hepatobiliary: The liver is normal in size and contour. Gallbladder is unremarkable. No intrahepatic or  extrahepatic biliary ductal dilatation. Pancreas: Unremarkable Spleen: Unremarkable Adrenals/Urinary Tract: Stable 2.3 cm right adrenal nodule compatible with adenoma. No follow-up needed. Stable mildly thickened left adrenal gland. Unchanged 3 mm stone inferior pole left kidney. Unchanged 2 cm cyst right upper pole. Unchanged subcentimeter too small to characterize exophytic lesion superior pole left kidney. Urinary bladder is unremarkable. Stomach/Bowel: No abnormal bowel wall thickening or evidence for bowel obstruction. No free fluid or free intraperitoneal air. Normal appendix. Normal morphology of the stomach. Vascular/Lymphatic: Normal caliber abdominal aorta. Peripheral calcified atherosclerotic plaque. Reproductive: Heterogeneous prostate. Other: None. Musculoskeletal: No aggressive or acute appearing osseous lesions. Lumbar spine degenerative changes. IMPRESSION: Unchanged spiculated posterior right upper lobe pulmonary nodule. No recurrent thoracic adenopathy or progressive metastatic disease in the chest, abdomen or pelvis. Aortic Atherosclerosis (ICD10-I70.0) and Emphysema (ICD10-J43.9). Electronically Signed   By: Lovey Newcomer M.D.   On: 05/20/2022 13:43   SLEEP STUDY DOCUMENTS  Result Date: 05/03/2022 Ordered by an unspecified provider.  Split night study  Result Date: 04/29/2022 Rigoberto Noel, MD     05/05/2022  1:02 PM Patient Name: Gerrod, Maule Date: 04/29/2022 Gender: Male D.O.B: 05-22-1956 Age (years): 21 Referring Provider: Geraldo Pitter NP Height (inches): 67 Interpreting Physician: Kara Mead MD, ABSM Weight (lbs): 230 RPSGT: Baxter Flattery BMI: 36 MRN: [id number] Neck Size: 17.00 <br> <br> CLINICAL INFORMATION The patient is referred for a split night study to reassess OSA. MEDICATIONS Medications self-administered by patient taken the night of the study : N/A SLEEP STUDY TECHNIQUE As per the AASM Manual for the Scoring of Sleep and Associated Events v2.3 (April 2016) with a  hypopnea requiring 4% desaturations. The channels recorded and monitored were frontal, central and occipital EEG, electrooculogram (EOG), submentalis EMG (chin), nasal and oral airflow, thoracic and abdominal wall motion, anterior tibialis EMG, snore microphone, electrocardiogram, and pulse oximetry. Bi-level positive airway pressure (BiPAP) was initiated when the patient met split night criteria and was titrated according to treat sleep-disordered breathing. RESPIRATORY PARAMETERS Diagnostic Total AHI (/hr): 69.9 RDI (/hr): 69.9 OA Index (/hr): 5.9 CA Index (/hr): 4.6 REM AHI (/hr):  61.4 NREM AHI (/hr): 73.4 Supine AHI (/hr): 75.6 Non-supine AHI (/hr): 69.30 Min O2 Sat (%): 64.0 Mean O2 (%): 91.6 Time below 88% (min): 30 Titration Optimal IPAP Pressure (cm): 21 Optimal EPAP Pressure (cm): 17 AHI at Optimal Pressure (/hr): 0 Min O2 at Optimal Pressure (%): 96.0 Sleep % at Optimal (%): 92 Supine % at Optimal (%): 0 SLEEP ARCHITECTURE The study was initiated at 10:02:00 PM and terminated at 4:42:47 AM. The total recorded time was 400.8 minutes. EEG confirmed total sleep time was 345 minutes yielding a sleep efficiency of 86.1%%. Sleep onset after lights out was 0.9 minutes with a REM latency of 59.5 minutes. The patient spent 6.8%% of the night in stage N1 sleep, 60.7%% in stage N2 sleep, 0.0%% in stage N3 and 32.5% in REM. Wake after sleep onset (WASO) was 54.9 minutes. The Arousal Index was 2.1/hour. LEG MOVEMENT DATA The total Periodic Limb Movements of Sleep (PLMS) were 0. The PLMS index was 0.0 . CARDIAC DATA The 2 lead EKG demonstrated sinus rhythm. The mean heart rate was 100.0 beats per minute. Other EKG findings include: None. IMPRESSIONS - Severe obstructive sleep apnea occurred during the diagnostic portion of the study (AHI = 69.9 /hour). Central apneas emerged at CPAP 11 cm & persisted on biPAP. An optimal PAP pressure was selected for this patient ( 21 / 17cm of water) - Moderate central sleep apnea  occurred during the diagnostic portion of the study (CAI = 4.6/hour). - Severe oxygen desaturation was noted during the diagnostic portion of the study (Min O2 = 64.0%). - No snoring was audible during this study. - No cardiac abnormalities were noted during this study. - Clinically significant periodic limb movements of sleep did not occur during the study. DIAGNOSIS - Obstructive Sleep Apnea (G47.33) - Treatment emergent central apneas RECOMMENDATIONS - Trial of BiPAP therapy on 21/17 cm H2O with a Medium size Resmed Nasal Pillow AirFit P30 mask and heated humidification. Alternatively, autoBiPAP can be used - Avoid alcohol, sedatives and other CNS depressants that may worsen sleep apnea and disrupt normal sleep architecture. - Sleep hygiene should be reviewed to assess factors that may improve sleep quality. - Weight management and regular exercise should be initiated or continued. - Return to [facility] Center for re-evaluation. Review download to confirm that central apneas subside with time Kara Mead MD Board Certified in [place]:   Obstructive sleep apnea - Hx sleep apnea, stopped wearing CPAP > 5 years ago. Patient has symptoms of snoring and disrupted sleep.  Epworth score 18.  Split-night sleep study on 04/29/2022 showed evidence of severe obstructive sleep apnea, AHI 69.9/hour with SPO2 low 64%. Moderare central sleep apnea.  Optimal PAP pressure 21/17 centimeters H2O with medium size Resmed nasal pillow AirFIT P30 mask.  Reviewed treatment options with patient, he is open to starting BiPAP therapy.  We have placed an order for patient to receive new BiPAP machine with local DME company at recommended pressure settings. Recommend patient aim to wear BiPAP every night for minimum 4 to 6 hours or longer.  Encouraged weight loss efforts.  Advised against driving experiencing excessive daytime sleepiness fatigue.  Follow-up in 31 to 90 days after starting BiPAP therapy for  compliance check.   Martyn Ehrich, NP 05/23/2022

## 2022-05-23 NOTE — Progress Notes (Signed)
[facility] [phone]   [phone]  OFFICE PROGRESS NOTE  [name], NP [place] [address]  DIAGNOSIS: Stage IV (T2b, N3, M1a) non-small cell lung cancer, adenocarcinoma presented with large right upper lobe lung mass in addition to mediastinal and right supraclavicular lymphadenopathy as well as bilateral pulmonary nodules diagnosed in February 2021.  MOLECULAR STUDY by [employer]:  KRASG12C, 4.3%, Binimetinib  [id number], 0.9%, Niraparib, Olaparib, Rucaparib,Talazoparib, Tazemetostat  [id number] 1.7%, None   PRIOR THERAPY: Systemic chemotherapy with carboplatin for AUC of 5, Alimta 500 mg/M2 and Keytruda 200 mg IV every 3 weeks.  First dose December 31, 2019.  Status post 31 cycles.  The last few cycles he has been treated with single agent Keytruda secondary to renal insufficiency.  This treatment was discontinued secondary to immunotherapy mediated colitis and diarrhea  CURRENT THERAPY: Observation.  INTERVAL HISTORY: Kevin Lambert 66y.o. male returns to the clinic today for 3 months follow-up visit.  The patient is feeling fine today with no concerning complaints.  He denied having any current chest pain, shortness of breath, cough or hemoptysis.  He denied having any fever or chills.  He has no nausea, vomiting, diarrhea or constipation.  He has no headache or visual changes.  He has no recent weight loss or night sweats.  He is currently on observation.  He had repeat CT scan of the chest, abdomen and pelvis performed recently and he is here for evaluation and discussion of his scan results.  MEDICAL HISTORY: Past Medical History:  Diagnosis Date   Arthritis    Chronic kidney disease    Depression    DM (diabetes mellitus) ([place]    type II   Dyslipidemia    Dyspnea    unable to walk much - he thinks it is from    GERD (gastroesophageal reflux disease)    Hepatitis    Hepatitis C- treated    History of kidney stones    HTN (hypertension)    Neuropathy    nscl ca dx'd 10/2019   OSA on CPAP    PTSD (post-traumatic stress disorder)     ALLERGIES:  is allergic to metformin and related, naproxen, and oxycontin [oxycodone].  MEDICATIONS:  Current Outpatient Medications  Medication Sig Dispense Refill   amLODipine (NORVASC) 10 MG tablet Take 1 tablet (10 mg total) by mouth daily. 90 tablet 1   Ascorbic Acid (VITAMIN C) 1000 MG tablet Take 1,000 mg by mouth daily.     aspirin EC 81 MG tablet Take 81 mg by mouth daily.     atorvastatin (LIPITOR) 10 MG tablet Take 1 tablet (10 mg total) by mouth daily. 90 tablet 1   blood glucose meter kit and supplies Dispense based on patient and insurance preference. Patient to check blood sugars daily, prior to eating and also as needed (FOR ICD-10 E10.9, E11.9). 1 each 0   carvedilol (COREG) 3.125 MG tablet Take 1 tablet (3.125 mg total) by mouth 2 (two) times daily with a meal. 180 tablet 1   cholecalciferol (VITAMIN D3) 25 MCG (1000 UNIT) tablet Take 1,000 Units by mouth daily.     Cyanocobalamin (VITAMIN B 12 PO) Take 1 tablet by mouth daily.     empagliflozin (JARDIANCE) 25 MG TABS tablet Take 1 tablet (25 mg total) by mouth daily before breakfast. 90 tablet 1   magic mouthwash SOLN Take 5 mLs by mouth 4 (four) times  daily as needed for mouth pain. Swish and swallow or spit 240 mL 2   meclizine (ANTIVERT) 25 MG tablet TAKE ONE-HALF TABLET BY MOUTH THREE TIMES A DAY AS NEEDED FOR DIZZINESS     nicotine (NICODERM CQ - DOSED IN MG/24 HOURS) 21 mg/24hr patch Place 1 patch (21 mg total) onto the skin daily. 30 patch 1   omeprazole (PRILOSEC) 20 MG capsule Take 1 capsule (20 mg total) by mouth daily. 30 capsule 1   prochlorperazine (COMPAZINE) 10 MG tablet Take 1 tablet (10 mg total) by mouth every 6 (six) hours as needed. 30 tablet 2   Propylene Glycol (SYSTANE BALANCE OP) Place 1 drop into both eyes daily.     Semaglutide,0.25 or 0.5MG/DOS,  (OZEMPIC, 0.25 OR 0.5 MG/DOSE,) 2 MG/1.5ML SOPN Inject 0.25 mg into the skin once a week. 1.5 mL 3   sildenafil (VIAGRA) 100 MG tablet TAKE ONE TABLET BY MOUTH AS INSTRUCTED (TAKE 1 HOUR PRIOR TO SEXUAL ACTIVITY *DO NOT EXCEED 1 DOSE PER 24 HOUR PERIOD*) 10 tablet 5   triamcinolone cream (KENALOG) 0.5 % Apply 1 Application topically 2 (two) times daily. 454 g 1   triamterene-hydrochlorothiazide (MAXZIDE-25) 37.5-25 MG tablet Take 1 tablet by mouth daily. 90 tablet 1   zinc gluconate 50 MG tablet Take 50 mg by mouth daily.     No current facility-administered medications for this visit.    SURGICAL HISTORY:  Past Surgical History:  Procedure Laterality Date   BIOPSY OF MEDIASTINAL MASS  12/10/2019   Procedure: BIOPSY OF MEDIASTINAL MASS;  Surgeon: Garner Nash, DO;  Location: [facility];  Service: Pulmonary;;  right upper lobe   BRONCHIAL NEEDLE ASPIRATION BIOPSY  12/10/2019   Procedure: BRONCHIAL NEEDLE ASPIRATION BIOPSIES;  Surgeon: Garner Nash, DO;  Location: [facility];  Service: Pulmonary;;   COLONOSCOPY     IR IMAGING GUIDED PORT INSERTION  12/31/2019   kidney stone     URETERAL STENT PLACEMENT     Ureteral Stent Removed     VIDEO BRONCHOSCOPY WITH ENDOBRONCHIAL ULTRASOUND N/A 12/10/2019   Procedure: VIDEO BRONCHOSCOPY WITH ENDOBRONCHIAL ULTRASOUND;  Surgeon: Garner Nash, DO;  Location: [facility];  Service: Pulmonary;  Laterality: N/A;    REVIEW OF SYSTEMS:  A comprehensive review of systems was negative except for: Constitutional: positive for fatigue   PHYSICAL EXAMINATION: General appearance: alert, cooperative, and no distress Head: Normocephalic, without obvious abnormality, atraumatic Neck: no adenopathy, no JVD, supple, symmetrical, trachea midline, and thyroid not enlarged, symmetric, no tenderness/mass/nodules Lymph nodes: Cervical, supraclavicular, and axillary nodes normal. Resp: clear to auscultation bilaterally Back: symmetric, no curvature. ROM normal.  No CVA tenderness. Cardio: regular rate and rhythm, S1, S2 normal, no murmur, click, rub or gallop GI: soft, non-tender; bowel sounds normal; no masses,  no organomegaly Extremities: extremities normal, atraumatic, no cyanosis or edema  ECOG PERFORMANCE STATUS: 1 - Symptomatic but completely ambulatory  Blood pressure 134/67, pulse 66, temperature 98.3 F (36.8 C), temperature source Oral, resp. rate 15, weight 227 lb 12.8 oz (103.3 kg), SpO2 98 %.  LABORATORY DATA: Lab Results  Component Value Date   WBC 5.9 05/19/2022   HGB 14.4 05/19/2022   HCT 42.3 05/19/2022   MCV 90.0 05/19/2022   PLT 188 05/19/2022      Chemistry      Component Value Date/Time   NA 135 05/19/2022 0856   NA 138 01/08/2021 0955   K 4.2 05/19/2022 0856   CL 104 05/19/2022 0856   CO2 23  05/19/2022 0856   BUN 56 (H) 05/19/2022 0856   BUN 28 (H) 01/08/2021 0955   CREATININE 2.25 (H) 05/19/2022 0856      Component Value Date/Time   CALCIUM 11.0 (H) 05/19/2022 0856   ALKPHOS 51 05/19/2022 0856   AST 20 05/19/2022 0856   ALT 37 05/19/2022 0856   BILITOT 0.2 (L) 05/19/2022 0856       RADIOGRAPHIC STUDIES: CT Chest Wo Contrast  Result Date: 05/20/2022 CLINICAL DATA:  Patient with history of non-small cell lung cancer. Follow-up exam. * Tracking Code: BO * EXAM: CT CHEST, ABDOMEN AND PELVIS WITHOUT CONTRAST TECHNIQUE: Multidetector CT imaging of the chest, abdomen and pelvis was performed following the standard protocol without IV contrast. RADIATION DOSE REDUCTION: This exam was performed according to the departmental dose-optimization program which includes automated exposure control, adjustment of the mA and/or kV according to patient size and/or use of iterative reconstruction technique. COMPARISON:  CT C AP February 17, 2022 FINDINGS: CT CHEST FINDINGS Cardiovascular: Right anterior chest wall Port-A-Cath is present with tip terminating in the superior vena cava. Normal heart size. Thoracic aortic and  coronary arterial vascular calcifications. Mediastinum/Nodes: No enlarged axillary, mediastinal or hilar lymphadenopathy. Normal appearance of the esophagus. Lungs/Pleura: Central airways are patent. Similar-appearing 2.6 x 1.4 cm irregular nodule within the right upper lobe adjacent to the fissure (image 62; series 4). No pleural effusion or pneumothorax. Musculoskeletal: No aggressive or acute appearing osseous lesions. Thoracic spine degenerative changes. CT ABDOMEN PELVIS FINDINGS Hepatobiliary: The liver is normal in size and contour. Gallbladder is unremarkable. No intrahepatic or extrahepatic biliary ductal dilatation. Pancreas: Unremarkable Spleen: Unremarkable Adrenals/Urinary Tract: Stable 2.3 cm right adrenal nodule compatible with adenoma. No follow-up needed. Stable mildly thickened left adrenal gland. Unchanged 3 mm stone inferior pole left kidney. Unchanged 2 cm cyst right upper pole. Unchanged subcentimeter too small to characterize exophytic lesion superior pole left kidney. Urinary bladder is unremarkable. Stomach/Bowel: No abnormal bowel wall thickening or evidence for bowel obstruction. No free fluid or free intraperitoneal air. Normal appendix. Normal morphology of the stomach. Vascular/Lymphatic: Normal caliber abdominal aorta. Peripheral calcified atherosclerotic plaque. Reproductive: Heterogeneous prostate. Other: None. Musculoskeletal: No aggressive or acute appearing osseous lesions. Lumbar spine degenerative changes. IMPRESSION: Unchanged spiculated posterior right upper lobe pulmonary nodule. No recurrent thoracic adenopathy or progressive metastatic disease in the chest, abdomen or pelvis. Aortic Atherosclerosis (ICD10-I70.0) and Emphysema (ICD10-J43.9). Electronically Signed   By: Lovey Newcomer M.D.   On: 05/20/2022 13:43   CT Abdomen Pelvis Wo Contrast  Result Date: 05/20/2022 CLINICAL DATA:  Patient with history of non-small cell lung cancer. Follow-up exam. * Tracking Code: BO *  EXAM: CT CHEST, ABDOMEN AND PELVIS WITHOUT CONTRAST TECHNIQUE: Multidetector CT imaging of the chest, abdomen and pelvis was performed following the standard protocol without IV contrast. RADIATION DOSE REDUCTION: This exam was performed according to the departmental dose-optimization program which includes automated exposure control, adjustment of the mA and/or kV according to patient size and/or use of iterative reconstruction technique. COMPARISON:  CT C AP February 17, 2022 FINDINGS: CT CHEST FINDINGS Cardiovascular: Right anterior chest wall Port-A-Cath is present with tip terminating in the superior vena cava. Normal heart size. Thoracic aortic and coronary arterial vascular calcifications. Mediastinum/Nodes: No enlarged axillary, mediastinal or hilar lymphadenopathy. Normal appearance of the esophagus. Lungs/Pleura: Central airways are patent. Similar-appearing 2.6 x 1.4 cm irregular nodule within the right upper lobe adjacent to the fissure (image 62; series 4). No pleural effusion or pneumothorax. Musculoskeletal: No aggressive or  acute appearing osseous lesions. Thoracic spine degenerative changes. CT ABDOMEN PELVIS FINDINGS Hepatobiliary: The liver is normal in size and contour. Gallbladder is unremarkable. No intrahepatic or extrahepatic biliary ductal dilatation. Pancreas: Unremarkable Spleen: Unremarkable Adrenals/Urinary Tract: Stable 2.3 cm right adrenal nodule compatible with adenoma. No follow-up needed. Stable mildly thickened left adrenal gland. Unchanged 3 mm stone inferior pole left kidney. Unchanged 2 cm cyst right upper pole. Unchanged subcentimeter too small to characterize exophytic lesion superior pole left kidney. Urinary bladder is unremarkable. Stomach/Bowel: No abnormal bowel wall thickening or evidence for bowel obstruction. No free fluid or free intraperitoneal air. Normal appendix. Normal morphology of the stomach. Vascular/Lymphatic: Normal caliber abdominal aorta. Peripheral  calcified atherosclerotic plaque. Reproductive: Heterogeneous prostate. Other: None. Musculoskeletal: No aggressive or acute appearing osseous lesions. Lumbar spine degenerative changes. IMPRESSION: Unchanged spiculated posterior right upper lobe pulmonary nodule. No recurrent thoracic adenopathy or progressive metastatic disease in the chest, abdomen or pelvis. Aortic Atherosclerosis (ICD10-I70.0) and Emphysema (ICD10-J43.9). Electronically Signed   By: Lovey Newcomer M.D.   On: 05/20/2022 13:43   SLEEP STUDY DOCUMENTS  Result Date: 05/03/2022 Ordered by an unspecified provider.  Split night study  Result Date: 04/29/2022 Rigoberto Noel, MD     05/05/2022  1:02 PM Patient Name: Kevin Lambert, [place] Date: 04/29/2022 Gender: Male D.O.B: [birth year] Age (years): 12 Referring Provider: Geraldo Pitter NP Height (inches): 67 Interpreting Physician: Kara Mead MD, ABSM Weight (lbs): 230 RPSGT: Baxter Flattery BMI: 36 MRN: [id number] Neck Size: 17.00 <br> <br> CLINICAL INFORMATION The patient is referred for a split night study to reassess OSA. MEDICATIONS Medications self-administered by patient taken the night of the study : N/A SLEEP STUDY TECHNIQUE As per the AASM Manual for the Scoring of Sleep and Associated Events v2.3 (April 2016) with a hypopnea requiring 4% desaturations. The channels recorded and monitored were frontal, central and occipital EEG, electrooculogram (EOG), submentalis EMG (chin), nasal and oral airflow, thoracic and abdominal wall motion, anterior tibialis EMG, snore microphone, electrocardiogram, and pulse oximetry. Bi-level positive airway pressure (BiPAP) was initiated when the patient met split night criteria and was titrated according to treat sleep-disordered breathing. RESPIRATORY PARAMETERS Diagnostic Total AHI (/hr): 69.9 RDI (/hr): 69.9 OA Index (/hr): 5.9 CA Index (/hr): 4.6 REM AHI (/hr): 61.4 NREM AHI (/hr): 73.4 Supine AHI (/hr): 75.6 Non-supine AHI (/hr): 69.30 Min O2 Sat (%): 64.0  Mean O2 (%): 91.6 Time below 88% (min): 30 Titration Optimal IPAP Pressure (cm): 21 Optimal EPAP Pressure (cm): 17 AHI at Optimal Pressure (/hr): 0 Min O2 at Optimal Pressure (%): 96.0 Sleep % at Optimal (%): 92 Supine % at Optimal (%): 0 SLEEP ARCHITECTURE The study was initiated at 10:02:00 PM and terminated at 4:42:47 AM. The total recorded time was 400.8 minutes. EEG confirmed total sleep time was 345 minutes yielding a sleep efficiency of 86.1%%. Sleep onset after lights out was 0.9 minutes with a REM latency of 59.5 minutes. The patient spent 6.8%% of the night in stage N1 sleep, 60.7%% in stage N2 sleep, 0.0%% in stage N3 and 32.5% in REM. Wake after sleep onset (WASO) was 54.9 minutes. The Arousal Index was 2.1/hour. LEG MOVEMENT DATA The total Periodic Limb Movements of Sleep (PLMS) were 0. The PLMS index was 0.0 . CARDIAC DATA The 2 lead EKG demonstrated sinus rhythm. The mean heart rate was 100.0 beats per minute. Other EKG findings include: None. IMPRESSIONS - Severe obstructive sleep apnea occurred during the diagnostic portion of the study (AHI = 69.9 /hour). Central apneas  emerged at CPAP 11 cm & persisted on biPAP. An optimal PAP pressure was selected for this patient ( 21 / 17cm of water) - Moderate central sleep apnea occurred during the diagnostic portion of the study (CAI = 4.6/hour). - Severe oxygen desaturation was noted during the diagnostic portion of the study (Min O2 = 64.0%). - No snoring was audible during this study. - No cardiac abnormalities were noted during this study. - Clinically significant periodic limb movements of sleep did not occur during the study. DIAGNOSIS - Obstructive Sleep Apnea (G47.33) - Treatment emergent central apneas RECOMMENDATIONS - Trial of BiPAP therapy on 21/17 cm H2O with a Medium size Resmed Nasal Pillow AirFit P30 mask and heated humidification. Alternatively, autoBiPAP can be used - Avoid alcohol, sedatives and other CNS depressants that may worsen sleep  apnea and disrupt normal sleep architecture. - Sleep hygiene should be reviewed to assess factors that may improve sleep quality. - Weight management and regular exercise should be initiated or continued. - Return to [facility] for re-evaluation. Review download to confirm that central apneas subside with time Kara Mead MD Board Certified in [place]: This is a very pleasant 66 years old African-American male recently diagnosed with a stage IV non-small cell lung cancer, adenocarcinoma with positive KRAS G12C mutation presented with large right lower lobe lung mass in addition to mediastinal and right supraclavicular lymphadenopathy as well as bilateral pulmonary nodules diagnosed in February 2021. I explained to the patient that he has incurable condition and all the treatment options will be of palliative nature. The patient has no actionable mutations on the recent molecular studies and he is not a candidate for the [facility] clinical trial. He started systemic chemotherapy with carboplatin, Alimta and Keytruda status post 31 cycles.  Starting from cycle #5 the patient is on maintenance treatment with Alimta and Keytruda every 3 weeks.  He has been recently on single agent Keytruda for several cycles secondary to renal insufficiency. His treatment was discontinued secondary to immunotherapy mediated colitis with diarrhea. The patient is currently on observation and he is feeling fine with no concerning complaints except for mild fatigue. He had repeat CT scan of the chest, abdomen and pelvis performed recently.  I personally and independently reviewed the scans and discussed the results with the patient today.  His scan showed no concerning findings for disease progression. I recommended for him to continue on observation with repeat CT scan of the chest, abdomen pelvis in 3 months. For the renal insufficiency, I advised the patient to reach out to his nephrologist for  further evaluation and management. The patient was advised to call immediately if he has any other concerning symptoms in the interval. The patient voices understanding of current disease status and treatment options and is in agreement with the current care plan.  All questions were answered. The patient knows to call the clinic with any problems, questions or concerns. We can certainly see the patient much sooner if necessary.  Disclaimer: This note was dictated with voice recognition software. Similar sounding words can inadvertently be transcribed and may not be corrected upon review.

## 2022-05-23 NOTE — Progress Notes (Signed)
Reviewed and agree with assessment/plan.   Chesley Mires, MD [facility] Pulmonary/Critical Care 05/23/2022, 4:22 PM Pager:  [phone]

## 2022-05-23 NOTE — Patient Instructions (Addendum)
Sleep study on 04/29/2022 showed evidence of severe obstructive sleep apnea with severe oxygen desaturation  Treatment options include weight loss, oral appliance, CPAP therapy or referral to ENT for possible surgical options  Due to severity of your sleep apnea recommending you be started on BiPAP  Recommendations Aim to wear BiPAP every single night for minimum 4 to 6 hours or longer Do not drive experiencing excessive daytime sleepiness or fatigue Work on weight loss efforts  Orders: - New BIPAP - pressure 21/17 cm H2O with medium size ResMed nasal pillow AirFit P30 mask and heated humidification  Follow-up - 2 - 3 months with Beth NP to review BiPAP compliance    CPAP and BIPAP Information CPAP and BIPAP are methods that use air pressure to keep your airways open and to help you breathe well. CPAP and BIPAP use different amounts of pressure. Your health care provider will tell you whether CPAP or BIPAP would be more helpful for you. CPAP stands for "continuous positive airway pressure." With CPAP, the amount of pressure stays the same while you breathe in (inhale) and out (exhale). BIPAP stands for "bi-level positive airway pressure." With BIPAP, the amount of pressure will be higher when you inhale and lower when you exhale. This allows you to take larger breaths. CPAP or BIPAP may be used in the hospital, or your health care provider may want you to use it at home. You may need to have a sleep study before your health care provider can order a machine for you to use at home. What are the advantages? CPAP or BIPAP can be helpful if you have: Sleep apnea. Chronic obstructive pulmonary disease (COPD). Heart failure. Medical conditions that cause muscle weakness, including muscular dystrophy or amyotrophic lateral sclerosis (ALS). Other problems that cause breathing to be shallow, weak, abnormal, or difficult. CPAP and BIPAP are most commonly used for obstructive sleep apnea (OSA) to  keep the airways from collapsing when the muscles relax during sleep. What are the risks? Generally, this is a safe treatment. However, problems may occur, including: Irritated skin or skin sores if the mask does not fit properly. Dry or stuffy nose or nosebleeds. Dry mouth. Feeling gassy or bloated. Sinus or lung infection if the equipment is not cleaned properly. When should CPAP or BIPAP be used? In most cases, the mask only needs to be worn during sleep. Generally, the mask needs to be worn throughout the night and during any daytime naps. People with certain medical conditions may also need to wear the mask at other times, such as when they are awake. Follow instructions from your health care provider about when to use the machine. What happens during CPAP or BIPAP?  Both CPAP and BIPAP are provided by a small machine with a flexible plastic tube that attaches to a plastic mask that you wear. Air is blown through the mask into your nose or mouth. The amount of pressure that is used to blow the air can be adjusted on the machine. Your health care provider will set the pressure setting and help you find the best mask for you. Tips for using the mask Because the mask needs to be snug, some people feel trapped or closed-in (claustrophobic) when first using the mask. If you feel this way, you may need to get used to the mask. One way to do this is to hold the mask loosely over your nose or mouth and then gradually apply the mask more snugly. You can also gradually  increase the amount of time that you use the mask. Masks are available in various types and sizes. If your mask does not fit well, talk with your health care provider about getting a different one. Some common types of masks include: Full face masks, which fit over the mouth and nose. Nasal masks, which fit over the nose. Nasal pillow or prong masks, which fit into the nostrils. If you are using a mask that fits over your nose and you  tend to breathe through your mouth, a chin strap may be applied to help keep your mouth closed. Use a skin barrier to protect your skin as told by your health care provider. Some CPAP and BIPAP machines have alarms that may sound if the mask comes off or develops a leak. If you have trouble with the mask, it is very important that you talk with your health care provider about finding a way to make the mask easier to tolerate. Do not stop using the mask. There could be a negative impact on your health if you stop using the mask. Tips for using the machine Place your CPAP or BIPAP machine on a secure table or stand near an electrical outlet. Know where the on/off switch is on the machine. Follow instructions from your health care provider about how to set the pressure on your machine and when you should use it. Do not eat or drink while the CPAP or BIPAP machine is on. Food or fluids could get pushed into your lungs by the pressure of the CPAP or BIPAP. For home use, CPAP and BIPAP machines can be rented or purchased through home health care companies. Many different brands of machines are available. Renting a machine before purchasing may help you find out which particular machine works well for you. Your health insurance company may also decide which machine you may get. Keep the CPAP or BIPAP machine and attachments clean. Ask your health care provider for specific instructions. Check the humidifier if you have a dry stuffy nose or nosebleeds. Make sure it is working correctly. Follow these instructions at home: Take over-the-counter and prescription medicines only as told by your health care provider. Ask if you can take sinus medicine if your sinuses are blocked. Do not use any products that contain nicotine or tobacco. These products include cigarettes, chewing tobacco, and vaping devices, such as e-cigarettes. If you need help quitting, ask your health care provider. Keep all follow-up visits.  This is important. Contact a health care provider if: You have redness or pressure sores on your head, face, mouth, or nose from the mask or head gear. You have trouble using the CPAP or BIPAP machine. You cannot tolerate wearing the CPAP or BIPAP mask. Someone tells you that you snore even when wearing your CPAP or BIPAP. Get help right away if: You have trouble breathing. You feel confused. Summary CPAP and BIPAP are methods that use air pressure to keep your airways open and to help you breathe well. If you have trouble with the mask, it is very important that you talk with your health care provider about finding a way to make the mask easier to tolerate. Do not stop using the mask. There could be a negative impact to your health if you stop using the mask. Follow instructions from your health care provider about when to use the machine. This information is not intended to replace advice given to you by your health care provider. Make sure you discuss any  questions you have with your health care provider. Document Revised: 06/02/2021 Document Reviewed: 10/02/2020 Else[employer] Patient Education  [place].

## 2022-05-26 ENCOUNTER — Telehealth: Payer: Self-pay | Admitting: Nurse Practitioner

## 2022-05-26 ENCOUNTER — Other Ambulatory Visit: Payer: Self-pay | Admitting: Nurse Practitioner

## 2022-05-26 DIAGNOSIS — L259 Unspecified contact dermatitis, unspecified cause: Secondary | ICD-10-CM

## 2022-05-26 MED ORDER — TRIAMCINOLONE ACETONIDE 0.5 % EX CREA: 1.0000 | TOPICAL_CREAM | Freq: Two times a day (BID) | CUTANEOUS | 1 refills | Status: DC

## 2022-05-26 NOTE — Telephone Encounter (Signed)
Sent prescription for triamcinolone cream to [facility] in [place]. I did this via [employer].

## 2022-05-26 NOTE — Telephone Encounter (Signed)
Patient is requesting you to call him at [phone]

## 2022-05-26 NOTE — Telephone Encounter (Signed)
Called pt Rx will be sent to the [facility] hospital

## 2022-05-26 NOTE — Telephone Encounter (Signed)
Patient is requesting the topical ointment that was given to him by Nira Conn be re-sent to [facility] because they still have not gotten it. Please advise.

## 2022-05-26 NOTE — Telephone Encounter (Signed)
Called pt he is advised of his Rx that was sent

## 2022-05-26 NOTE — Telephone Encounter (Signed)
Called pt LVM to contact the office

## 2022-05-26 NOTE — Progress Notes (Signed)
Sent prescription for triamcinolone cream to [facility] in [place].

## 2022-05-30 ENCOUNTER — Other Ambulatory Visit: Payer: Self-pay

## 2022-06-03 ENCOUNTER — Telehealth: Payer: Self-pay | Admitting: Primary Care

## 2022-06-03 DIAGNOSIS — G4730 Sleep apnea, unspecified: Secondary | ICD-10-CM

## 2022-06-03 NOTE — Telephone Encounter (Signed)
Called and spoke with patient. He stated that his bipap machine order needs to be sent to the [facility] in [place]. The order was sent to [employer] and he was told that they do not process any benefits from the [place]. He would be required to pay the remaining balance after [employer] paid instead of the [place].   Since the original order was already placed, I advised him that I would go ahead and place the order.   Nothing further needed at time of call.

## 2022-06-28 ENCOUNTER — Telehealth: Payer: Self-pay | Admitting: Primary Care

## 2022-06-28 NOTE — Telephone Encounter (Signed)
BIPAP settings have been sent to [facility]. Nothing further needed at this time.

## 2022-06-28 NOTE — Telephone Encounter (Signed)
Order was placed 06/03/22 for pt's BIPAP settings. Routing this to [facility] for help with this.

## 2022-07-12 ENCOUNTER — Encounter: Payer: Self-pay | Admitting: Nurse Practitioner

## 2022-07-12 ENCOUNTER — Ambulatory Visit (INDEPENDENT_AMBULATORY_CARE_PROVIDER_SITE_OTHER): Payer: Medicare PPO | Admitting: Nurse Practitioner

## 2022-07-12 VITALS — BP 123/72 | HR 61 | Ht 66.0 in | Wt 228.0 lb

## 2022-07-12 DIAGNOSIS — Z125 Encounter for screening for malignant neoplasm of prostate: Secondary | ICD-10-CM | POA: Diagnosis not present

## 2022-07-12 DIAGNOSIS — E782 Mixed hyperlipidemia: Secondary | ICD-10-CM | POA: Diagnosis not present

## 2022-07-12 DIAGNOSIS — N529 Male erectile dysfunction, unspecified: Secondary | ICD-10-CM

## 2022-07-12 DIAGNOSIS — R5383 Other fatigue: Secondary | ICD-10-CM

## 2022-07-12 DIAGNOSIS — I10 Essential (primary) hypertension: Secondary | ICD-10-CM

## 2022-07-12 DIAGNOSIS — Z0000 Encounter for general adult medical examination without abnormal findings: Secondary | ICD-10-CM

## 2022-07-12 DIAGNOSIS — N1831 Chronic kidney disease, stage 3a: Secondary | ICD-10-CM

## 2022-07-12 DIAGNOSIS — E1122 Type 2 diabetes mellitus with diabetic chronic kidney disease: Secondary | ICD-10-CM | POA: Diagnosis not present

## 2022-07-12 MED ORDER — SILDENAFIL CITRATE 100 MG PO TABS: ORAL_TABLET | ORAL | 5 refills | Status: DC

## 2022-07-12 NOTE — Progress Notes (Signed)
Subjective:   Kevin Lambert. is a 66 y.o. male who presents for an Initial Medicare Annual Wellness Visit. -hsa had sleep study. Does have OSA. Will be getting Bipap machine. Should be here in next few weeks.  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 Sees oncology on routine basis. Has been told that tumors are getting smaller and he is stable. He is no longer getting chemotherapy due to negative side effects. These have resolved since chemotherapy has been stopped.   Review of Systems    Review of Systems  Constitutional:  Negative for chills, fever and malaise/fatigue.  HENT:  Negative for congestion, sinus pain and sore throat.   Eyes: Negative.   Respiratory:  Negative for cough, shortness of breath and wheezing.   Cardiovascular:  Negative for chest pain, palpitations and leg swelling.  Gastrointestinal:  Negative for constipation, diarrhea, nausea and vomiting.  Genitourinary: Negative.   Musculoskeletal:  Negative for myalgias.  Skin: Negative.   Neurological:  Negative for dizziness and headaches.  Endo/Heme/Allergies:  Does not bruise/bleed easily.       Patient states that he does not take his blood sugars routinely. 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   Psychiatric/Behavioral:  Negative for depression. The patient is not nervous/anxious.           Objective:    Today's Vitals   07/12/22 1058  BP: 123/72  Pulse: 61  SpO2: 94%  Weight: 228 lb (103.4 kg)  Height: _0  (1.676 m)   Body mass index is 36.8 kg/m.     10/07/2021   11:01 AM 09/16/2021   11:18 AM 08/05/2021    2:13 PM 06/24/2021    3:21 PM 06/03/2021   12:14 PM 05/06/2021   11:29 AM 04/15/2021    9:53 AM  Advanced Directives  Does Patient Have a Medical Advance Directive? _1  No No  Would patient like information on creating a medical advance directive?  No - Patient declined No - Patient declined No - Patient declined No - Guardian declined No - Patient declined No -  Patient declined    Current Medications (verified) Outpatient Encounter Medications as of 07/12/2022  Medication Sig  . amLODipine (NORVASC) 10 MG tablet Take 1 tablet (10 mg total) by mouth daily.  . Ascorbic Acid (VITAMIN C) 1000 MG tablet Take 1,000 mg by mouth daily.  Marland Kitchen aspirin EC 81 MG tablet Take 81 mg by mouth daily.  Marland Kitchen atorvastatin (LIPITOR) 10 MG tablet Take 1 tablet (10 mg total) by mouth daily.  . blood glucose meter kit and supplies Dispense based on patient and insurance preference. Patient to check blood sugars daily, prior to eating and also as needed (FOR ICD-10 E10.9, E11.9).  . carvedilol (COREG) 3.125 MG tablet Take 1 tablet (3.125 mg total) by mouth 2 (two) times daily with a meal.  . cholecalciferol (VITAMIN D3) 25 MCG (1000 UNIT) tablet Take 1,000 Units by mouth daily.  . Cyanocobalamin (VITAMIN B 12 PO) Take 1 tablet by mouth daily.  . empagliflozin (JARDIANCE) 25 MG TABS tablet Take 1 tablet (25 mg total) by mouth daily before breakfast.  . magic mouthwash SOLN Take 5 mLs by mouth 4 (four) times daily as needed for mouth pain. Swish and swallow or spit  . meclizine (ANTIVERT) 25 MG tablet TAKE ONE-HALF TABLET BY MOUTH THREE TIMES A DAY AS NEEDED FOR DIZZINESS  . nicotine (NICODERM CQ - DOSED IN MG/24  HOURS) 21 mg/24hr patch Place 1 patch (21 mg total) onto the skin daily.  Marland Kitchen omeprazole (PRILOSEC) 20 MG capsule Take 1 capsule (20 mg total) by mouth daily.  . prochlorperazine (COMPAZINE) 10 MG tablet Take 1 tablet (10 mg total) by mouth every 6 (six) hours as needed.  Marland Kitchen Propylene Glycol (SYSTANE BALANCE OP) Place 1 drop into both eyes daily.  . Semaglutide,0.25 or 0.5MG/DOS, (OZEMPIC, 0.25 OR 0.5 MG/DOSE,) 2 MG/1.5ML SOPN Inject 0.25 mg into the skin once a week.  . triamcinolone cream (KENALOG) 0.5 % Apply 1 Application topically 2 (two) times daily.  Marland Kitchen triamterene-hydrochlorothiazide (MAXZIDE-25) 37.5-25 MG tablet Take 1 tablet by mouth daily.  Marland Kitchen zinc gluconate 50 MG  tablet Take 50 mg by mouth daily.  . [DISCONTINUED] sildenafil (VIAGRA) 100 MG tablet TAKE ONE TABLET BY MOUTH AS INSTRUCTED (TAKE 1 HOUR PRIOR TO SEXUAL ACTIVITY *DO NOT EXCEED 1 DOSE PER 24 HOUR PERIOD*)  . sildenafil (VIAGRA) 100 MG tablet TAKE ONE TABLET BY MOUTH AS INSTRUCTED (TAKE 1 HOUR PRIOR TO SEXUAL ACTIVITY *DO NOT EXCEED 1 DOSE PER 24 HOUR PERIOD*)   No facility-administered encounter medications on file as of 07/12/2022.    Allergies (verified) Metformin and related, Naproxen, and Oxycontin [oxycodone]   History: Past Medical History:  Diagnosis Date  . Arthritis   . Chronic kidney disease   . Depression   . DM (diabetes mellitus) ([place])    type II  . Dyslipidemia   . Dyspnea    unable to walk much - he thinks it is from   . GERD (gastroesophageal reflux disease)   . Hepatitis    Hepatitis C- treated  . History of kidney stones   . HTN (hypertension)   . Neuropathy   . nscl ca dx'd 10/2019  . OSA on CPAP   . PTSD (post-traumatic stress disorder)    Past Surgical History:  Procedure Laterality Date  . BIOPSY OF MEDIASTINAL MASS  12/10/2019   Procedure: BIOPSY OF MEDIASTINAL MASS;  Surgeon: Garner Nash, DO;  Location: [facility];  Service: Pulmonary;;  right upper lobe  . BRONCHIAL NEEDLE ASPIRATION BIOPSY  12/10/2019   Procedure: BRONCHIAL NEEDLE ASPIRATION BIOPSIES;  Surgeon: Garner Nash, DO;  Location: [facility];  Service: Pulmonary;;  . COLONOSCOPY    . IR IMAGING GUIDED PORT INSERTION  12/31/2019  . kidney stone    . URETERAL STENT PLACEMENT    . Ureteral Stent Removed    . VIDEO BRONCHOSCOPY WITH ENDOBRONCHIAL ULTRASOUND N/A 12/10/2019   Procedure: VIDEO BRONCHOSCOPY WITH ENDOBRONCHIAL ULTRASOUND;  Surgeon: Garner Nash, DO;  Location: [facility];  Service: Pulmonary;  Laterality: N/A;   Family History  Problem Relation Age of Onset  . Liver disease Mother   . Diabetes Father   . Diabetes Brother   . Esophageal cancer Neg Hx   . Colon  cancer Neg Hx   . Stomach cancer Neg Hx   . Pancreatic cancer Neg Hx    Social History   Socioeconomic History  . Marital status: Single    Spouse name: Not on file  . Number of children: Not on file  . Years of education: Not on file  . Highest education level: Not on file  Occupational History  . Occupation: Pension scheme manager: [id number]  Tobacco Use  . Smoking status: Some Days    Types: Cigars    Passive exposure: Past  . Smokeless tobacco: Never  . Tobacco comments:  05/23/22. : 1-2 Black & Milds / LW  Vaping Use  . Vaping Use: Never used  Substance and Sexual Activity  . Alcohol use: Yes    Alcohol/week: 6.0 standard drinks of alcohol    Types: 6 Cans of beer per week    Comment: occ  . Drug use: Never  . Sexual activity: Yes    Birth control/protection: None  Other Topics Concern  . Not on file  Social History Narrative  . Not on file   Social Determinants of Health   Financial Resource Strain: High Risk (03/25/2021)   Overall Financial Resource Strain (CARDIA)   . Difficulty of Paying Living Expenses: Very hard  Food Insecurity: Food Insecurity Present (03/25/2021)   Hunger Vital Sign   . Worried About Charity fundraiser in the Last Year: Sometimes true   . Ran Out of Food in the Last Year: Sometimes true  Transportation Needs: Unmet Transportation Needs (05/16/2022)   PRAPARE - Transportation   . Lack of Transportation (Medical): Yes   . Lack of Transportation (Non-Medical): Yes  Physical Activity: Not on file  Stress: Stress Concern Present (03/25/2021)   [place]   . Feeling of Stress : To some extent  Social Connections: Unknown (03/25/2021)   Social Connection and Isolation Panel [NHANES]   . Frequency of Communication with Friends and Family: Three times a week   . Frequency of Social Gatherings with Friends and Family: Three times a week   . Attends Religious Services: Never    . Active Member of Clubs or Organizations: No   . Attends Archivist Meetings: Never   . Marital Status: Not on file   Physical Exam Vitals and nursing note reviewed.  Constitutional:      Appearance: Normal appearance. He is well-developed.  HENT:     Head: Normocephalic and atraumatic.     Nose: Nose normal.     Mouth/Throat:     Mouth: Mucous membranes are moist.     Pharynx: Oropharynx is clear.  Eyes:     Extraocular Movements: Extraocular movements intact.     Conjunctiva/sclera: Conjunctivae normal.     Pupils: Pupils are equal, round, and reactive to light.  Neck:     Vascular: No carotid bruit.  Cardiovascular:     Rate and Rhythm: Normal rate and regular rhythm.     Pulses: Normal pulses.     Heart sounds: Normal heart sounds.  Pulmonary:     Effort: Pulmonary effort is normal.     Breath sounds: Normal breath sounds.  Abdominal:     General: Bowel sounds are normal. There is no distension.     Palpations: Abdomen is soft. There is no mass.     Tenderness: There is no abdominal tenderness. There is no right CVA tenderness, left CVA tenderness, guarding or rebound.     Hernia: No hernia is present.  Musculoskeletal:        General: Normal range of motion.     Cervical back: Normal range of motion and neck supple.  Lymphadenopathy:     Cervical: No cervical adenopathy.  Skin:    General: Skin is warm and dry.     Capillary Refill: Capillary refill takes less than 2 seconds.  Neurological:     General: No focal deficit present.     Mental Status: He is alert and oriented to person, place, and time.  Psychiatric:  Mood and Affect: Mood normal.        Behavior: Behavior normal.        Thought Content: Thought content normal.        Judgment: Judgment normal.     Tobacco Counseling Ready to quit: Not Answered Counseling given: Not Answered Tobacco comments: 05/23/22. : 1-2 Black & Milds / LW   Diabetic?yes   Activities of Daily  Living    02/22/2022   10:45 AM 01/14/2022   11:04 AM  In your present state of health, do you have any difficulty performing the following activities:  Hearing? 0 0  Vision? 1 0  Difficulty concentrating or making decisions? 1 0  Walking or climbing stairs? 1 1  Dressing or bathing? 0 0  Doing errands, shopping? 0 0    Patient Care Team: [name], NP as PCP - General (Family Medicine)  Indicate any recent Medical Services you may have received from other than [facility] providers in the past year (date may be approximate).     Assessment:  1. Encounter for Medicare annual wellness exam Annual medicare wellness visit today   2. Type 2 diabetes mellitus with stage 3a chronic kidney disease, without long-term current use of insulin (HCC) Check HgbA1c and adjust diabetic medication as indicated. Refer for diabetic eye exam . - Hemoglobin A1c - Ambulatory referral to Ophthalmology  3. Essential hypertension Stable. Continue medications as prescribed. Routine, fasting labs drawn during today's visit  - Lipid panel - Comprehensive metabolic panel - CBC  4. Other fatigue Check thyroid panel and cbc for further evaluation.  - TSH + free T4 - CBC  5. Mixed hyperlipidemia Check fasting lipid panel today - Lipid panel - Comprehensive metabolic panel - CBC  6. Erectile dysfunction, unspecified erectile dysfunction type Refill sildenafil and use as needed and as prescribed  - sildenafil (VIAGRA) 100 MG tablet; TAKE ONE TABLET BY MOUTH AS INSTRUCTED (TAKE 1 HOUR PRIOR TO SEXUAL ACTIVITY *DO NOT EXCEED 1 DOSE PER 24 HOUR PERIOD*)  Dispense: 10 tablet; Refill: 5  7. Healthcare maintenance Routine, fasting labs drawn during today's visit.   8. Screening for prostate cancer Check PSA with routine, fasting labs  - PSA   Hearing/Vision screen No results found.   Depression Screen    07/12/2022   11:13 AM 04/11/2022   10:42 AM 02/22/2022   10:44 AM 01/14/2022   11:02 AM  12/22/2021    9:35 AM 11/23/2021   11:14 AM 08/27/2021    8:58 AM  PHQ 2/9 Scores  PHQ - 2 Score 0 0 1 0 0 0 0  PHQ- 9 Score _0 Fall Risk    11/23/2021   11:14 AM 08/27/2021    8:59 AM 07/05/2021    9:37 AM 04/28/2021   10:19 AM 02/26/2021    9:04 AM  Fall Risk   Falls in the past year? 0 0 0 0 0  Number falls in past yr: 0 0 0 0 0  Injury with Fall? 0 0 0 0   Follow up _1     FALL RISK PREVENTION PERTAINING TO THE HOME:  Any stairs in or around the home? No  If so, are there any without handrails? No  Home free of loose throw rugs in walkways, pet beds, electrical cords, etc? Yes  Adequate lighting in your home to reduce risk of  falls? Yes   ASSISTIVE DEVICES UTILIZED TO PREVENT FALLS:  Life alert? No  Use of a cane, walker or w/c? No  Grab bars in the bathroom? Yes  Shower chair or bench in shower? No  Elevated toilet seat or a handicapped toilet? No   TIMED UP AND GO:  Was the test performed? Yes .  Length of time to ambulate 10 feet: 10 sec.   Gait steady and fast without use of assistive device  Cognitive Function:        04/28/2021   10:22 AM  6CIT Screen  What Year? 0 points  What month? 0 points  What time? 0 points  Count back from 20 0 points  Months in reverse 0 points  Repeat phrase 0 points  Total Score 0 points    Immunizations Immunization History  Administered Date(s) Administered  . Hep A / Hep B 01/06/2016, 02/05/2016, 07/13/2016  . Influenza Split 09/04/2014  . Influenza-Unspecified 07/11/2007, 08/27/2013, 09/04/2014  . Moderna Sars-Covid-2 Vaccination 01/03/2020, 12/31/2020  . PFIZER(Purple Top)SARS-COV-2 Vaccination 11/20/2020, 12/11/2020  . Pneumococcal-Unspecified 05/21/2020  . Tdap 07/13/2017  . Zoster Recombinat (Shingrix) 09/22/2020, 04/28/2021    TDAP status: Up to date  Flu  Vaccine status: Due, Education has been provided regarding the importance of this vaccine. Advised may receive this vaccine at local pharmacy or Health Dept. Aware to provide a copy of the vaccination record if obtained from local pharmacy or Health Dept. Verbalized acceptance and understanding.  Pneumococcal vaccine status: Up to date  Covid-19 vaccine status: Completed vaccines  Qualifies for Shingles Vaccine? Yes   Zostavax completed Yes   Shingrix Completed?: Yes  Screening Tests Health Maintenance  Topic Date Due  . OPHTHALMOLOGY EXAM  Never done  . COVID-19 Vaccine (3 - Pfizer risk series) 01/28/2021  . FOOT EXAM  04/28/2022  . INFLUENZA VACCINE  06/07/2022  . COLONOSCOPY (Pts 45-51yr Insurance coverage will need to be confirmed)  02/23/2023 (Originally 09/08/2000)  . Hepatitis C Screening  02/23/2023 (Originally 09/08/1974)  . Pneumonia Vaccine 67 Years old (1 - PCV) 08/07/2023 (Originally 09/08/1962)  . HEMOGLOBIN A1C  10/11/2022  . URINE MICROALBUMIN  02/03/2023  . TETANUS/TDAP  07/14/2027  . Zoster Vaccines- Shingrix  Completed  . HPV VACCINES  Aged Out    Health Maintenance  Health Maintenance Due  Topic Date Due  . OPHTHALMOLOGY EXAM  Never done  . COVID-19 Vaccine (3 - Pfizer risk series) 01/28/2021  . FOOT EXAM  04/28/2022  . INFLUENZA VACCINE  06/07/2022    Colorectal cancer screening: Type of screening: Colonoscopy. Completed 2008. Repeat every n/a years  Lung Cancer Screening: (Low Dose CT Chest recommended if Age 67-80years, 30 pack-year currently smoking OR have quit w/in 15years.) does not qualify - patient treated for lung cancer per oncology.   Lung Cancer Screening Referral: n/a  Additional Screening:  Hepatitis C Screening: does not qualify; Completed 2015  Vision Screening: Recommended annual ophthalmology exams for early detection of glaucoma and other disorders of the eye. Is the patient up to date with their annual eye exam?  No  Who is the  provider or what is the name of the office in which the patient attends annual eye exams? N/a If pt is not established with a provider, would they like to be referred to a provider to establish care? Yes .   Dental Screening: Recommended annual dental exams for proper oral hygiene  Community Resource Referral / Chronic Care Management: CRR required this visit?  No   CCM required this visit?  No      Plan:     I have personally reviewed and noted the following in the patient's chart:   Medical and social history Use of alcohol, tobacco or illicit drugs  Current medications and supplements including opioid prescriptions. Patient is not currently taking opioid prescriptions. Functional ability and status Nutritional status Physical activity Advanced directives List of other physicians Hospitalizations, surgeries, and ER visits in previous 12 months Vitals Screenings to include cognitive, depression, and falls Referrals and appointments  In addition, I have reviewed and discussed with patient certain preventive protocols, quality metrics, and best practice recommendations. A written personalized care plan for preventive services as well as general preventive health recommendations were provided to patient.     [name], NP   07/12/2022

## 2022-07-13 ENCOUNTER — Other Ambulatory Visit: Payer: Self-pay

## 2022-07-13 ENCOUNTER — Encounter: Payer: Self-pay | Admitting: Internal Medicine

## 2022-07-13 LAB — CBC
Hematocrit: 43 % (ref 37.5–51.0)
Hemoglobin: 14.6 g/dL (ref 13.0–17.7)
MCH: 31.5 pg (ref 26.6–33.0)
MCHC: 34 g/dL (ref 31.5–35.7)
MCV: 93 fL (ref 79–97)
Platelets: 178 10*3/uL (ref 150–450)
RBC: 4.63 x10E6/uL (ref 4.14–5.80)
RDW: 15.4 % (ref 11.6–15.4)
WBC: 4.9 10*3/uL (ref 3.4–10.8)

## 2022-07-13 LAB — COMPREHENSIVE METABOLIC PANEL
ALT: 41 IU/L (ref 0–44)
AST: 15 IU/L (ref 0–40)
Albumin/Globulin Ratio: 1.8 (ref 1.2–2.2)
Albumin: 4.2 g/dL (ref 3.9–4.9)
Alkaline Phosphatase: 74 IU/L (ref 44–121)
BUN/Creatinine Ratio: 15 (ref 10–24)
BUN: 28 mg/dL — ABNORMAL HIGH (ref 8–27)
Bilirubin Total: 0.3 mg/dL (ref 0.0–1.2)
CO2: 21 mmol/L (ref 20–29)
Calcium: 10.4 mg/dL — ABNORMAL HIGH (ref 8.6–10.2)
Chloride: 100 mmol/L (ref 96–106)
Creatinine, Ser: 1.92 mg/dL — ABNORMAL HIGH (ref 0.76–1.27)
Globulin, Total: 2.3 g/dL (ref 1.5–4.5)
Glucose: 126 mg/dL — ABNORMAL HIGH (ref 70–99)
Potassium: 4.6 mmol/L (ref 3.5–5.2)
Sodium: 137 mmol/L (ref 134–144)
Total Protein: 6.5 g/dL (ref 6.0–8.5)
eGFR: 38 mL/min/{1.73_m2} — ABNORMAL LOW (ref >59)

## 2022-07-13 LAB — LIPID PANEL
Chol/HDL Ratio: 4.7 ratio (ref 0.0–5.0)
Cholesterol, Total: 137 mg/dL (ref 100–199)
HDL: 29 mg/dL — ABNORMAL LOW (ref >39)
LDL Chol Calc (NIH): 89 mg/dL (ref 0–99)
Triglycerides: 102 mg/dL (ref 0–149)
VLDL Cholesterol Cal: 19 mg/dL (ref 5–40)

## 2022-07-13 LAB — TSH+FREE T4
Free T4: 1.19 ng/dL (ref 0.82–1.77)
TSH: 1.37 u[IU]/mL (ref 0.450–4.500)

## 2022-07-13 LAB — HEMOGLOBIN A1C
Est. average glucose Bld gHb Est-mCnc: 143 mg/dL
Hgb A1c MFr Bld: 6.6 % — ABNORMAL HIGH (ref 4.8–5.6)

## 2022-07-13 LAB — PSA: Prostate Specific Ag, Serum: 4.4 ng/mL — ABNORMAL HIGH (ref 0.0–4.0)

## 2022-07-18 ENCOUNTER — Telehealth: Payer: Self-pay

## 2022-07-18 NOTE — Telephone Encounter (Signed)
Patient called about his Hemoglobin A1C results if he could receive a call back regarding them, please give patient a call at your earliest convenience thanks!

## 2022-07-18 NOTE — Progress Notes (Signed)
Correction, his PSA was 4.4.

## 2022-07-18 NOTE — Telephone Encounter (Signed)
Please let the patient know that I have reviewed his labs. His renal functions and calcium levels are still elevated, however, they are much, much better than previous checks. His cholesterol looks good. His PSA was slightly elevated at 4.6. this, along with the renal functions we can continue to monitor. His HgbA1c was 6.6. no changes to any medications. Thanks so much.   -HB

## 2022-07-18 NOTE — Telephone Encounter (Signed)
Called pt he is advised of his lab results

## 2022-07-24 NOTE — Progress Notes (Signed)
 This encounter was created in error - please disregard.

## 2022-07-25 ENCOUNTER — Encounter: Payer: No Typology Code available for payment source | Admitting: Primary Care

## 2022-08-01 DIAGNOSIS — N184 Chronic kidney disease, stage 4 (severe): Secondary | ICD-10-CM | POA: Diagnosis not present

## 2022-08-04 ENCOUNTER — Telehealth: Payer: Self-pay | Admitting: Internal Medicine

## 2022-08-04 NOTE — Telephone Encounter (Signed)
Called patient regarding upcoming October appointments, patient is notified.  

## 2022-08-17 ENCOUNTER — Other Ambulatory Visit: Payer: Self-pay | Admitting: Physician Assistant

## 2022-08-19 ENCOUNTER — Inpatient Hospital Stay: Payer: Medicare PPO | Attending: Internal Medicine

## 2022-08-19 ENCOUNTER — Other Ambulatory Visit: Payer: Self-pay

## 2022-08-19 ENCOUNTER — Ambulatory Visit (HOSPITAL_COMMUNITY)
Admission: RE | Admit: 2022-08-19 | Discharge: 2022-08-19 | Disposition: A | Discharge status: Home / Self Care | Payer: No Typology Code available for payment source | Source: Ambulatory Visit | Attending: Internal Medicine | Admitting: Internal Medicine

## 2022-08-19 ENCOUNTER — Inpatient Hospital Stay: Payer: Medicare PPO

## 2022-08-19 DIAGNOSIS — C3431 Malignant neoplasm of lower lobe, right bronchus or lung: Secondary | ICD-10-CM | POA: Insufficient documentation

## 2022-08-19 DIAGNOSIS — C3490 Malignant neoplasm of unspecified part of unspecified bronchus or lung: Secondary | ICD-10-CM | POA: Insufficient documentation

## 2022-08-19 DIAGNOSIS — Z79899 Other long term (current) drug therapy: Secondary | ICD-10-CM | POA: Diagnosis not present

## 2022-08-19 DIAGNOSIS — N289 Disorder of kidney and ureter, unspecified: Secondary | ICD-10-CM | POA: Insufficient documentation

## 2022-08-19 DIAGNOSIS — J984 Other disorders of lung: Secondary | ICD-10-CM | POA: Diagnosis not present

## 2022-08-19 DIAGNOSIS — N200 Calculus of kidney: Secondary | ICD-10-CM | POA: Diagnosis not present

## 2022-08-19 DIAGNOSIS — N281 Cyst of kidney, acquired: Secondary | ICD-10-CM | POA: Diagnosis not present

## 2022-08-19 LAB — CBC WITH DIFFERENTIAL (CANCER CENTER ONLY)
Abs Immature Granulocytes: 0.02 10*3/uL (ref 0.00–0.07)
Basophils Absolute: 0 10*3/uL (ref 0.0–0.1)
Basophils Relative: 1 %
Eosinophils Absolute: 0.2 10*3/uL (ref 0.0–0.5)
Eosinophils Relative: 5 %
HCT: 41.9 % (ref 39.0–52.0)
Hemoglobin: 14.6 g/dL (ref 13.0–17.0)
Immature Granulocytes: 1 %
Lymphocytes Relative: 38 %
Lymphs Abs: 1.7 10*3/uL (ref 0.7–4.0)
MCH: 32.2 pg (ref 26.0–34.0)
MCHC: 34.8 g/dL (ref 30.0–36.0)
MCV: 92.3 fL (ref 80.0–100.0)
Monocytes Absolute: 0.4 10*3/uL (ref 0.1–1.0)
Monocytes Relative: 10 %
Neutro Abs: 2 10*3/uL (ref 1.7–7.7)
Neutrophils Relative %: 45 %
Platelet Count: 155 10*3/uL (ref 150–400)
RBC: 4.54 MIL/uL (ref 4.22–5.81)
RDW: 14.8 % (ref 11.5–15.5)
WBC Count: 4.4 10*3/uL (ref 4.0–10.5)
nRBC: 0 % (ref 0.0–0.2)

## 2022-08-19 LAB — CMP (CANCER CENTER ONLY)
ALT: 22 U/L (ref 0–44)
AST: 14 U/L — ABNORMAL LOW (ref 15–41)
Albumin: 4 g/dL (ref 3.5–5.0)
Alkaline Phosphatase: 53 U/L (ref 38–126)
Anion gap: 5 (ref 5–15)
BUN: 29 mg/dL — ABNORMAL HIGH (ref 8–23)
CO2: 25 mmol/L (ref 22–32)
Calcium: 9.6 mg/dL (ref 8.9–10.3)
Chloride: 104 mmol/L (ref 98–111)
Creatinine: 1.58 mg/dL — ABNORMAL HIGH (ref 0.61–1.24)
GFR, Estimated: 48 mL/min — ABNORMAL LOW (ref >60)
Glucose, Bld: 114 mg/dL — ABNORMAL HIGH (ref 70–99)
Potassium: 4.4 mmol/L (ref 3.5–5.1)
Sodium: 134 mmol/L — ABNORMAL LOW (ref 135–145)
Total Bilirubin: 0.4 mg/dL (ref 0.3–1.2)
Total Protein: 6.7 g/dL (ref 6.5–8.1)

## 2022-08-22 ENCOUNTER — Inpatient Hospital Stay: Payer: Medicare PPO

## 2022-08-22 ENCOUNTER — Inpatient Hospital Stay (HOSPITAL_BASED_OUTPATIENT_CLINIC_OR_DEPARTMENT_OTHER): Payer: Medicare PPO | Admitting: Internal Medicine

## 2022-08-22 ENCOUNTER — Other Ambulatory Visit: Payer: Self-pay

## 2022-08-22 VITALS — BP 172/84 | HR 82 | Temp 98.4°F | Resp 16 | Wt 229.0 lb

## 2022-08-22 DIAGNOSIS — C3490 Malignant neoplasm of unspecified part of unspecified bronchus or lung: Secondary | ICD-10-CM | POA: Diagnosis not present

## 2022-08-22 DIAGNOSIS — N289 Disorder of kidney and ureter, unspecified: Secondary | ICD-10-CM | POA: Diagnosis not present

## 2022-08-22 DIAGNOSIS — C3491 Malignant neoplasm of unspecified part of right bronchus or lung: Secondary | ICD-10-CM

## 2022-08-22 DIAGNOSIS — Z79899 Other long term (current) drug therapy: Secondary | ICD-10-CM | POA: Diagnosis not present

## 2022-08-22 DIAGNOSIS — C3431 Malignant neoplasm of lower lobe, right bronchus or lung: Secondary | ICD-10-CM | POA: Diagnosis not present

## 2022-08-22 DIAGNOSIS — Z95828 Presence of other vascular implants and grafts: Secondary | ICD-10-CM

## 2022-08-22 MED ORDER — HEPARIN SOD (PORK) LOCK FLUSH 100 UNIT/ML IV SOLN
500.0000 [IU] | Freq: Once | INTRAVENOUS | Status: AC
  Administered 2022-08-22: 500 [IU]

## 2022-08-22 MED ORDER — SODIUM CHLORIDE 0.9% FLUSH
10.0000 mL | Freq: Once | INTRAVENOUS | Status: AC
  Administered 2022-08-22: 10 mL

## 2022-08-22 NOTE — Progress Notes (Signed)
[place] [phone]   [phone]  OFFICE PROGRESS NOTE  Kevin Freshwater, NP [place] [address]  DIAGNOSIS: Stage IV (T2b, N3, M1a) non-small cell lung cancer, adenocarcinoma presented with large right upper lobe lung mass in addition to mediastinal and right supraclavicular lymphadenopathy as well as bilateral pulmonary nodules diagnosed in February 2021.  MOLECULAR STUDY by [employer]:  KRASG12C, 4.3%, Binimetinib  [id number], 0.9%, Niraparib, Olaparib, Rucaparib,Talazoparib, Tazemetostat  [id number], 1.7%, None   PRIOR THERAPY: Systemic chemotherapy with carboplatin for AUC of 5, Alimta 500 mg/M2 and Keytruda 200 mg IV every 3 weeks.  First dose December 31, 2019.  Status post 31 cycles.  The last few cycles he has been treated with single agent Keytruda secondary to renal insufficiency.  This treatment was discontinued secondary to immunotherapy mediated colitis and diarrhea  CURRENT THERAPY: Observation.  INTERVAL HISTORY: Kevin Lambert. 67 y.o. male returns to the clinic today for follow-up visit.  The patient is feeling fine today with no concerning complaints.  He denied having any fatigue or weakness.  He denied having any chest pain, shortness of breath, cough or hemoptysis.  He has no nausea, vomiting, diarrhea or constipation.  He denied having any headache or visual changes.  He has no recent weight loss or night sweats.  He is here today for evaluation with repeat CT scan of the chest, abdomen and pelvis for restaging of his disease.  MEDICAL HISTORY: Past Medical History:  Diagnosis Date   Arthritis    Chronic kidney disease    Depression    DM (diabetes mellitus) ([place])    type II   Dyslipidemia    Dyspnea    unable to walk much - he thinks it is from    GERD (gastroesophageal reflux disease)    Hepatitis    Hepatitis C- treated   History of kidney stones    HTN (hypertension)     Neuropathy    nscl ca dx'd 10/2019   OSA on CPAP    PTSD (post-traumatic stress disorder)     ALLERGIES:  is allergic to metformin and related, naproxen, and oxycontin [oxycodone].  MEDICATIONS:  Current Outpatient Medications  Medication Sig Dispense Refill   amLODipine (NORVASC) 10 MG tablet Take 1 tablet (10 mg total) by mouth daily. 90 tablet 1   Ascorbic Acid (VITAMIN C) 1000 MG tablet Take 1,000 mg by mouth daily.     aspirin EC 81 MG tablet Take 81 mg by mouth daily.     atorvastatin (LIPITOR) 10 MG tablet Take 1 tablet (10 mg total) by mouth daily. 90 tablet 1   blood glucose meter kit and supplies Dispense based on patient and insurance preference. Patient to check blood sugars daily, prior to eating and also as needed (FOR ICD-10 E10.9, E11.9). 1 each 0   carvedilol (COREG) 3.125 MG tablet Take 1 tablet (3.125 mg total) by mouth 2 (two) times daily with a meal. 180 tablet 1   cholecalciferol (VITAMIN D3) 25 MCG (1000 UNIT) tablet Take 1,000 Units by mouth daily.     Cyanocobalamin (VITAMIN B 12 PO) Take 1 tablet by mouth daily.     empagliflozin (JARDIANCE) 25 MG TABS tablet Take 1 tablet (25 mg total) by mouth daily before breakfast. 90 tablet 1   magic mouthwash SOLN Take 5 mLs by mouth 4 (four) times daily as needed for mouth pain. Swish and swallow or spit 240  mL 2   meclizine (ANTIVERT) 25 MG tablet TAKE ONE-HALF TABLET BY MOUTH THREE TIMES A DAY AS NEEDED FOR DIZZINESS     nicotine (NICODERM CQ - DOSED IN MG/24 HOURS) 21 mg/24hr patch Place 1 patch (21 mg total) onto the skin daily. 30 patch 1   omeprazole (PRILOSEC) 20 MG capsule Take 1 capsule (20 mg total) by mouth daily. 30 capsule 1   prochlorperazine (COMPAZINE) 10 MG tablet Take 1 tablet (10 mg total) by mouth every 6 (six) hours as needed. 30 tablet 2   Propylene Glycol (SYSTANE BALANCE OP) Place 1 drop into both eyes daily.     Semaglutide,0.25 or 0.5MG /DOS, (OZEMPIC, 0.25 OR 0.5 MG/DOSE,) 2 MG/1.5ML SOPN Inject  0.25 mg into the skin once a week. 1.5 mL 3   sildenafil (VIAGRA) 100 MG tablet TAKE ONE TABLET BY MOUTH AS INSTRUCTED (TAKE 1 HOUR PRIOR TO SEXUAL ACTIVITY *DO NOT EXCEED 1 DOSE PER 24 HOUR PERIOD*) 10 tablet 5   triamcinolone cream (KENALOG) 0.5 % Apply 1 Application topically 2 (two) times daily. 454 g 1   triamterene-hydrochlorothiazide (MAXZIDE-25) 37.5-25 MG tablet Take 1 tablet by mouth daily. 90 tablet 1   zinc gluconate 50 MG tablet Take 50 mg by mouth daily.     No current facility-administered medications for this visit.    SURGICAL HISTORY:  Past Surgical History:  Procedure Laterality Date   BIOPSY OF MEDIASTINAL MASS  12/10/2019   Procedure: BIOPSY OF MEDIASTINAL MASS;  Surgeon: Garner Nash, DO;  Location: [facility];  Service: Pulmonary;;  right upper lobe   BRONCHIAL NEEDLE ASPIRATION BIOPSY  12/10/2019   Procedure: BRONCHIAL NEEDLE ASPIRATION BIOPSIES;  Surgeon: Garner Nash, DO;  Location: [facility];  Service: Pulmonary;;   COLONOSCOPY     IR IMAGING GUIDED PORT INSERTION  12/31/2019   kidney stone     URETERAL STENT PLACEMENT     Ureteral Stent Removed     VIDEO BRONCHOSCOPY WITH ENDOBRONCHIAL ULTRASOUND N/A 12/10/2019   Procedure: VIDEO BRONCHOSCOPY WITH ENDOBRONCHIAL ULTRASOUND;  Surgeon: Garner Nash, DO;  Location: [facility];  Service: Pulmonary;  Laterality: N/A;    REVIEW OF SYSTEMS:  A comprehensive review of systems was negative.   PHYSICAL EXAMINATION: General appearance: alert, cooperative, and no distress Head: Normocephalic, without obvious abnormality, atraumatic Neck: no adenopathy, no JVD, supple, symmetrical, trachea midline, and thyroid not enlarged, symmetric, no tenderness/mass/nodules Lymph nodes: Cervical, supraclavicular, and axillary nodes normal. Resp: clear to auscultation bilaterally Back: symmetric, no curvature. ROM normal. No CVA tenderness. Cardio: regular rate and rhythm, S1, S2 normal, no murmur, click, rub or  gallop GI: soft, non-tender; bowel sounds normal; no masses,  no organomegaly Extremities: extremities normal, atraumatic, no cyanosis or edema  ECOG PERFORMANCE STATUS: 0 - Asymptomatic  Blood pressure (!) 172/84, pulse 82, temperature 98.4 F (36.9 C), temperature source Oral, resp. rate 16, weight 229 lb (103.9 kg), SpO2 99 %.  LABORATORY DATA: Lab Results  Component Value Date   WBC 4.4 08/19/2022   HGB 14.6 08/19/2022   HCT 41.9 08/19/2022   MCV 92.3 08/19/2022   PLT 155 08/19/2022      Chemistry      Component Value Date/Time   NA 134 (L) 08/19/2022 1042   NA 137 07/12/2022 1115   K 4.4 08/19/2022 1042   CL 104 08/19/2022 1042   CO2 25 08/19/2022 1042   BUN 29 (H) 08/19/2022 1042   BUN 28 (H) 07/12/2022 1115   CREATININE 1.58 (H)  08/19/2022 1042      Component Value Date/Time   CALCIUM 9.6 08/19/2022 1042   ALKPHOS 53 08/19/2022 1042   AST 14 (L) 08/19/2022 1042   ALT 22 08/19/2022 1042   BILITOT 0.4 08/19/2022 1042       RADIOGRAPHIC STUDIES: CT Chest Wo Contrast  Result Date: 08/22/2022 CLINICAL DATA:  Non-small cell lung cancer restaging * Tracking Code: BO * EXAM: CT CHEST, ABDOMEN AND PELVIS WITHOUT CONTRAST TECHNIQUE: Multidetector CT imaging of the chest, abdomen and pelvis was performed following the standard protocol without IV contrast. Oral enteric contrast was administered. RADIATION DOSE REDUCTION: This exam was performed according to the departmental dose-optimization program which includes automated exposure control, adjustment of the mA and/or kV according to patient size and/or use of iterative reconstruction technique. COMPARISON:  05/19/2022 FINDINGS: CT CHEST FINDINGS Cardiovascular: Right chest port catheter. Aortic atherosclerosis. Normal heart size. Three-vessel coronary artery calcifications. No pericardial effusion. Mediastinum/Nodes: Unchanged post treatment appearance of subcentimeter pretracheal and right hilar lymph nodes (series 2,  image 22). No persistently enlarged mediastinal, hilar, or axillary lymph nodes. Thyroid gland, trachea, and esophagus demonstrate no significant findings. Lungs/Pleura: Diffuse bilateral bronchial wall thickening. Unchanged post treatment appearance of a mass of the posterior suprahilar right upper lobe, measuring 3.3 x 1.7 cm (series 4, image 52). No pleural effusion or pneumothorax. Musculoskeletal: No chest wall abnormality. No acute osseous findings. CT ABDOMEN PELVIS FINDINGS Hepatobiliary: No solid liver abnormality is seen. No gallstones, gallbladder wall thickening, or biliary dilatation. Pancreas: Unremarkable. No pancreatic ductal dilatation or surrounding inflammatory changes. Spleen: Normal in size without significant abnormality. Adrenals/Urinary Tract: Stable, definitively benign fat containing bilateral adrenal adenomata (series 2, image 53). Small bilateral nonobstructive renal calculi. No ureteral calculi or hydronephrosis. Simple, benign right renal cortical cysts, for which no further follow-up or characterization required. Unchanged thickening of the urinary bladder wall, likely secondary to chronic outlet obstruction. Stomach/Bowel: Stomach is within normal limits. Appendix appears normal. No evidence of bowel wall thickening, distention, or inflammatory changes. Vascular/Lymphatic: Aortic atherosclerosis. No enlarged abdominal or pelvic lymph nodes. Reproductive: Prostatomegaly. Other: No abdominal wall hernia or abnormality. No ascites. Musculoskeletal: No acute osseous findings. IMPRESSION: 1. Unchanged post treatment appearance of a mass of the posterior suprahilar right upper lobe. 2. Unchanged post treatment appearance of subcentimeter pretracheal and right hilar lymph nodes. No persistently enlarged mediastinal, hilar, or axillary lymph nodes. 3. No noncontrast evidence of metastatic disease within the abdomen or pelvis. 4. Diffuse bilateral bronchial wall thickening, consistent with  nonspecific infectious or inflammatory bronchitis. 5. Nonobstructive bilateral nephrolithiasis. 6. Prostatomegaly with findings of chronic outlet obstruction. 7. Coronary artery disease. Aortic Atherosclerosis (ICD10-I70.0). Electronically Signed   By: Delanna Ahmadi M.D.   On: 08/22/2022 07:42   CT Abdomen Pelvis Wo Contrast  Result Date: 08/22/2022 CLINICAL DATA:  Non-small cell lung cancer restaging * Tracking Code: BO * EXAM: CT CHEST, ABDOMEN AND PELVIS WITHOUT CONTRAST TECHNIQUE: Multidetector CT imaging of the chest, abdomen and pelvis was performed following the standard protocol without IV contrast. Oral enteric contrast was administered. RADIATION DOSE REDUCTION: This exam was performed according to the departmental dose-optimization program which includes automated exposure control, adjustment of the mA and/or kV according to patient size and/or use of iterative reconstruction technique. COMPARISON:  05/19/2022 FINDINGS: CT CHEST FINDINGS Cardiovascular: Right chest port catheter. Aortic atherosclerosis. Normal heart size. Three-vessel coronary artery calcifications. No pericardial effusion. Mediastinum/Nodes: Unchanged post treatment appearance of subcentimeter pretracheal and right hilar lymph nodes (series 2, image 22). No  persistently enlarged mediastinal, hilar, or axillary lymph nodes. Thyroid gland, trachea, and esophagus demonstrate no significant findings. Lungs/Pleura: Diffuse bilateral bronchial wall thickening. Unchanged post treatment appearance of a mass of the posterior suprahilar right upper lobe, measuring 3.3 x 1.7 cm (series 4, image 52). No pleural effusion or pneumothorax. Musculoskeletal: No chest wall abnormality. No acute osseous findings. CT ABDOMEN PELVIS FINDINGS Hepatobiliary: No solid liver abnormality is seen. No gallstones, gallbladder wall thickening, or biliary dilatation. Pancreas: Unremarkable. No pancreatic ductal dilatation or surrounding inflammatory changes.  Spleen: Normal in size without significant abnormality. Adrenals/Urinary Tract: Stable, definitively benign fat containing bilateral adrenal adenomata (series 2, image 53). Small bilateral nonobstructive renal calculi. No ureteral calculi or hydronephrosis. Simple, benign right renal cortical cysts, for which no further follow-up or characterization required. Unchanged thickening of the urinary bladder wall, likely secondary to chronic outlet obstruction. Stomach/Bowel: Stomach is within normal limits. Appendix appears normal. No evidence of bowel wall thickening, distention, or inflammatory changes. Vascular/Lymphatic: Aortic atherosclerosis. No enlarged abdominal or pelvic lymph nodes. Reproductive: Prostatomegaly. Other: No abdominal wall hernia or abnormality. No ascites. Musculoskeletal: No acute osseous findings. IMPRESSION: 1. Unchanged post treatment appearance of a mass of the posterior suprahilar right upper lobe. 2. Unchanged post treatment appearance of subcentimeter pretracheal and right hilar lymph nodes. No persistently enlarged mediastinal, hilar, or axillary lymph nodes. 3. No noncontrast evidence of metastatic disease within the abdomen or pelvis. 4. Diffuse bilateral bronchial wall thickening, consistent with nonspecific infectious or inflammatory bronchitis. 5. Nonobstructive bilateral nephrolithiasis. 6. Prostatomegaly with findings of chronic outlet obstruction. 7. Coronary artery disease. Aortic Atherosclerosis (ICD10-I70.0). Electronically Signed   By: Delanna Ahmadi M.D.   On: 08/22/2022 07:42     ASSESSMENT AND PLAN: This is a very pleasant 66 years old African-American male recently diagnosed with a stage IV non-small cell lung cancer, adenocarcinoma with positive KRAS G12C mutation presented with large right lower lobe lung mass in addition to mediastinal and right supraclavicular lymphadenopathy as well as bilateral pulmonary nodules diagnosed in February 2021. I explained to the  patient that he has incurable condition and all the treatment options will be of palliative nature. The patient has no actionable mutations on the recent molecular studies and he is not a candidate for the [facility] clinical trial. He started systemic chemotherapy with carboplatin, Alimta and Keytruda status post 31 cycles.  Starting from cycle #5 the patient is on maintenance treatment with Alimta and Keytruda every 3 weeks.  He has been recently on single agent Keytruda for several cycles secondary to renal insufficiency. His treatment was discontinued secondary to immunotherapy mediated colitis with diarrhea. He is currently on observation and feeling fine with no concerning complaints. He had repeat CT scan of the chest, abdomen and pelvis performed recently.  I personally and independently reviewed the scans and discussed the result with the patient today. His scan showed no concerning findings for disease progression. I recommended for the patient to continue on observation with repeat CT scan of the chest in 4 months. I will arrange for the patient to have Port-A-Cath flush every 2 months starting today. For the renal insufficiency, I advised the patient to reach out to his nephrologist for further evaluation and management. The patient was advised to call immediately if he has any other concerning symptoms in the interval. The patient voices understanding of current disease status and treatment options and is in agreement with the current care plan.  All questions were answered. The patient knows to call the  clinic with any problems, questions or concerns. We can certainly see the patient much sooner if necessary.  Disclaimer: This note was dictated with voice recognition software. Similar sounding words can inadvertently be transcribed and may not be corrected upon review.

## 2022-08-30 ENCOUNTER — Telehealth: Payer: Self-pay | Admitting: Internal Medicine

## 2022-08-30 NOTE — Telephone Encounter (Signed)
Scheduled per 10/16 los, patient has been called and notified of upcoming appointments.

## 2022-09-16 ENCOUNTER — Telehealth: Payer: Self-pay

## 2022-09-19 ENCOUNTER — Ambulatory Visit: Payer: Medicare PPO | Admitting: Primary Care

## 2022-09-23 NOTE — Telephone Encounter (Signed)
Left voicemail for patient to call back. Patient needs to r/s appointment for CPAP compliance. Request for new [facility] authorization has been submitted.

## 2022-09-23 NOTE — Telephone Encounter (Signed)
Received message from the [facility] that patient is established with the [facility] for CPAP and the sleep clinic so he will need to re-establish care with the [facility].

## 2022-10-05 ENCOUNTER — Telehealth: Payer: Self-pay | Admitting: *Deleted

## 2022-10-05 ENCOUNTER — Other Ambulatory Visit: Payer: Self-pay | Admitting: Nurse Practitioner

## 2022-10-05 ENCOUNTER — Telehealth: Payer: Self-pay

## 2022-10-05 DIAGNOSIS — M545 Low back pain, unspecified: Secondary | ICD-10-CM

## 2022-10-05 MED ORDER — CYCLOBENZAPRINE HCL 10 MG PO TABS: 10.0000 mg | ORAL_TABLET | Freq: Every evening | ORAL | 1 refills | Status: DC | PRN

## 2022-10-05 NOTE — Telephone Encounter (Signed)
ok 

## 2022-10-05 NOTE — Telephone Encounter (Signed)
This has been done.

## 2022-10-05 NOTE — Telephone Encounter (Signed)
Called pt he stated that he would like his Rx sent to the [facility]

## 2022-10-05 NOTE — Telephone Encounter (Signed)
Hey. I can see, in the last year, that he was given a prescription for methocarbamol but not cyclobenzaprine. I can send new prescription for that if he wants.

## 2022-10-05 NOTE — Telephone Encounter (Signed)
Called pt he is advised about his Rx Cyclobenzaprine that will be sent to the [facility] on [address]

## 2022-10-05 NOTE — Telephone Encounter (Signed)
Pt called and wanted to speak to Katrina, I called to ask him what he needed and he said he needs a refill on cyclobenzaprine (muscle relaxer), did not see on current med list. He would like the Rx to go to [facility] on [address].  He would also like a return call once this is complete.Kevin Lambert, CMA

## 2022-10-11 ENCOUNTER — Ambulatory Visit: Payer: Medicare PPO | Admitting: Nurse Practitioner

## 2022-10-19 ENCOUNTER — Ambulatory Visit: Payer: Medicare PPO | Admitting: Nurse Practitioner

## 2022-10-24 ENCOUNTER — Inpatient Hospital Stay: Payer: No Typology Code available for payment source | Attending: Internal Medicine

## 2022-10-24 ENCOUNTER — Ambulatory Visit: Payer: Medicare PPO | Admitting: Nurse Practitioner

## 2022-10-24 ENCOUNTER — Other Ambulatory Visit: Payer: Self-pay

## 2022-10-24 ENCOUNTER — Inpatient Hospital Stay: Payer: No Typology Code available for payment source

## 2022-10-24 DIAGNOSIS — Z452 Encounter for adjustment and management of vascular access device: Secondary | ICD-10-CM | POA: Diagnosis not present

## 2022-10-24 DIAGNOSIS — C3491 Malignant neoplasm of unspecified part of right bronchus or lung: Secondary | ICD-10-CM

## 2022-10-24 DIAGNOSIS — C3431 Malignant neoplasm of lower lobe, right bronchus or lung: Secondary | ICD-10-CM | POA: Insufficient documentation

## 2022-10-24 DIAGNOSIS — Z95828 Presence of other vascular implants and grafts: Secondary | ICD-10-CM

## 2022-10-24 MED ORDER — HEPARIN SOD (PORK) LOCK FLUSH 100 UNIT/ML IV SOLN
500.0000 [IU] | Freq: Once | INTRAVENOUS | Status: AC
  Administered 2022-10-24: 500 [IU]

## 2022-10-24 MED ORDER — SODIUM CHLORIDE 0.9% FLUSH
10.0000 mL | Freq: Once | INTRAVENOUS | Status: AC
  Administered 2022-10-24: 10 mL

## 2022-11-28 DIAGNOSIS — N184 Chronic kidney disease, stage 4 (severe): Secondary | ICD-10-CM | POA: Diagnosis not present

## 2022-11-30 ENCOUNTER — Ambulatory Visit (INDEPENDENT_AMBULATORY_CARE_PROVIDER_SITE_OTHER): Payer: Medicare PPO | Admitting: Nurse Practitioner

## 2022-11-30 ENCOUNTER — Encounter: Payer: Self-pay | Admitting: Nurse Practitioner

## 2022-11-30 ENCOUNTER — Encounter: Payer: Self-pay | Admitting: Internal Medicine

## 2022-11-30 VITALS — BP 148/86 | HR 68 | Ht 66.0 in | Wt 240.1 lb

## 2022-11-30 DIAGNOSIS — M545 Low back pain, unspecified: Secondary | ICD-10-CM

## 2022-11-30 DIAGNOSIS — E785 Hyperlipidemia, unspecified: Secondary | ICD-10-CM

## 2022-11-30 DIAGNOSIS — R197 Diarrhea, unspecified: Secondary | ICD-10-CM

## 2022-11-30 DIAGNOSIS — E1159 Type 2 diabetes mellitus with other circulatory complications: Secondary | ICD-10-CM | POA: Diagnosis not present

## 2022-11-30 DIAGNOSIS — N1831 Chronic kidney disease, stage 3a: Secondary | ICD-10-CM

## 2022-11-30 DIAGNOSIS — E1169 Type 2 diabetes mellitus with other specified complication: Secondary | ICD-10-CM

## 2022-11-30 DIAGNOSIS — N529 Male erectile dysfunction, unspecified: Secondary | ICD-10-CM | POA: Diagnosis not present

## 2022-11-30 DIAGNOSIS — C3490 Malignant neoplasm of unspecified part of unspecified bronchus or lung: Secondary | ICD-10-CM | POA: Diagnosis not present

## 2022-11-30 DIAGNOSIS — F321 Major depressive disorder, single episode, moderate: Secondary | ICD-10-CM | POA: Diagnosis not present

## 2022-11-30 DIAGNOSIS — E1122 Type 2 diabetes mellitus with diabetic chronic kidney disease: Secondary | ICD-10-CM

## 2022-11-30 DIAGNOSIS — R42 Dizziness and giddiness: Secondary | ICD-10-CM | POA: Diagnosis not present

## 2022-11-30 DIAGNOSIS — F17210 Nicotine dependence, cigarettes, uncomplicated: Secondary | ICD-10-CM

## 2022-11-30 DIAGNOSIS — I152 Hypertension secondary to endocrine disorders: Secondary | ICD-10-CM

## 2022-11-30 LAB — POCT UA - MICROALBUMIN
Albumin/Creatinine Ratio, Urine, POC: 300
Creatinine, POC: 50 mg/dL
Microalbumin Ur, POC: 150 mg/L

## 2022-11-30 LAB — POCT GLYCOSYLATED HEMOGLOBIN (HGB A1C): HbA1c POC (<> result, manual entry): 7.3 % (ref 4.0–5.6)

## 2022-11-30 MED ORDER — OMEPRAZOLE 20 MG PO CPDR: 20.0000 mg | DELAYED_RELEASE_CAPSULE | Freq: Every day | ORAL | 1 refills | Status: DC

## 2022-11-30 MED ORDER — SILDENAFIL CITRATE 100 MG PO TABS: ORAL_TABLET | ORAL | 5 refills | Status: DC

## 2022-11-30 MED ORDER — NICOTINE 21 MG/24HR TD PT24: 21.0000 mg | MEDICATED_PATCH | Freq: Every day | TRANSDERMAL | 2 refills | Status: DC

## 2022-11-30 MED ORDER — SEMAGLUTIDE(0.25 OR 0.5MG/DOS) 2 MG/3ML ~~LOC~~ SOPN: 0.2500 mg | PEN_INJECTOR | SUBCUTANEOUS | 2 refills | Status: DC

## 2022-11-30 MED ORDER — MECLIZINE HCL 25 MG PO TABS: ORAL_TABLET | ORAL | 2 refills | Status: DC

## 2022-11-30 MED ORDER — CYCLOBENZAPRINE HCL 10 MG PO TABS: 10.0000 mg | ORAL_TABLET | Freq: Every evening | ORAL | 2 refills | Status: DC | PRN

## 2022-11-30 NOTE — Progress Notes (Signed)
Patient to see nephrology on 12/02/2022

## 2022-11-30 NOTE — Progress Notes (Unsigned)
Established patient visit   Patient: Kevin Lambert.   DOB: 1955/11/29   67 y.o. Male  MRN: [id number] Visit Date: 11/30/2022   Chief Complaint  Patient presents with   Follow-up   Diabetes   Subjective    HPI  Follow up  -type 2 diabetes  --HgbA1c check today is 7.3 --due for urine microalbumin is high abnormal. He does see nephrology and is scheduled to see them later this week.  --only taking Jardiance.  --never did start ozempic.  -has had 11 pound weight gain since most recent visit.  -blood pressure slightly elevated.  -feeling depressed.  --plans to go to [place] to ask for psychiatrist. Feels like this may be helpful.  --denies feeling suicidal. Verbally able to contract for safety with me today     Medications: Outpatient Medications Prior to Visit  Medication Sig   Ascorbic Acid (VITAMIN C) 1000 MG tablet Take 1,000 mg by mouth daily.   aspirin EC 81 MG tablet Take 81 mg by mouth daily.   blood glucose meter kit and supplies Dispense based on patient and insurance preference. Patient to check blood sugars daily, prior to eating and also as needed (FOR ICD-10 E10.9, E11.9).   cholecalciferol (VITAMIN D3) 25 MCG (1000 UNIT) tablet Take 1,000 Units by mouth daily.   Cyanocobalamin (VITAMIN B 12 PO) Take 1 tablet by mouth daily.   magic mouthwash SOLN Take 5 mLs by mouth 4 (four) times daily as needed for mouth pain. Swish and swallow or spit   prochlorperazine (COMPAZINE) 10 MG tablet Take 1 tablet (10 mg total) by mouth every 6 (six) hours as needed.   Propylene Glycol (SYSTANE BALANCE OP) Place 1 drop into both eyes daily.   triamcinolone cream (KENALOG) 0.5 % Apply 1 Application topically 2 (two) times daily.   zinc gluconate 50 MG tablet Take 50 mg by mouth daily.   [DISCONTINUED] amLODipine (NORVASC) 10 MG tablet Take 1 tablet (10 mg total) by mouth daily.   [DISCONTINUED] atorvastatin (LIPITOR) 10 MG tablet Take 1 tablet (10 mg total) by mouth daily.    [DISCONTINUED] carvedilol (COREG) 3.125 MG tablet Take 1 tablet (3.125 mg total) by mouth 2 (two) times daily with a meal.   [DISCONTINUED] cyclobenzaprine (FLEXERIL) 10 MG tablet Take 1 tablet (10 mg total) by mouth at bedtime as needed for muscle spasms.   [DISCONTINUED] empagliflozin (JARDIANCE) 25 MG TABS tablet Take 1 tablet (25 mg total) by mouth daily before breakfast.   [DISCONTINUED] meclizine (ANTIVERT) 25 MG tablet TAKE ONE-HALF TABLET BY MOUTH THREE TIMES A DAY AS NEEDED FOR DIZZINESS   [DISCONTINUED] nicotine (NICODERM CQ - DOSED IN MG/24 HOURS) 21 mg/24hr patch Place 1 patch (21 mg total) onto the skin daily.   [DISCONTINUED] omeprazole (PRILOSEC) 20 MG capsule Take 1 capsule (20 mg total) by mouth daily.   [DISCONTINUED] sildenafil (VIAGRA) 100 MG tablet TAKE ONE TABLET BY MOUTH AS INSTRUCTED (TAKE 1 HOUR PRIOR TO SEXUAL ACTIVITY *DO NOT EXCEED 1 DOSE PER 24 HOUR PERIOD*)   [DISCONTINUED] triamterene-hydrochlorothiazide (MAXZIDE-25) 37.5-25 MG tablet Take 1 tablet by mouth daily.   [DISCONTINUED] Semaglutide,0.25 or 0.'5MG'$ /DOS, (OZEMPIC, 0.25 OR 0.5 MG/DOSE,) 2 MG/1.5ML SOPN Inject 0.25 mg into the skin once a week. (Patient not taking: Reported on 11/30/2022)   No facility-administered medications prior to visit.    Review of Systems See HPI   Last CBC Lab Results  Component Value Date   WBC 6.1 12/19/2022   HGB 14.5 12/19/2022   HCT  39.9 12/19/2022   MCV 92.1 12/19/2022   MCH 33.5 12/19/2022   RDW 14.0 12/19/2022   PLT 162 [id number]   Last metabolic panel Lab Results  Component Value Date   GLUCOSE 121 (H) 12/19/2022   NA 136 12/19/2022   K 4.4 12/19/2022   CL 104 12/19/2022   CO2 27 12/19/2022   BUN 38 (H) 12/19/2022   CREATININE 2.25 (H) 12/19/2022   GFRNONAA 31 (L) 12/19/2022   CALCIUM 10.4 (H) 12/19/2022   PROT 6.7 12/19/2022   ALBUMIN 4.1 12/19/2022   LABGLOB 2.3 07/12/2022   AGRATIO 1.8 07/12/2022   BILITOT 0.3 12/19/2022   ALKPHOS 54 12/19/2022    AST 18 12/19/2022   ALT 31 12/19/2022   ANIONGAP 5 12/19/2022   Last lipids Lab Results  Component Value Date   CHOL 137 07/12/2022   HDL 29 (L) 07/12/2022   LDLCALC 89 07/12/2022   TRIG 102 07/12/2022   CHOLHDL 4.7 07/12/2022   Last hemoglobin A1c Lab Results  Component Value Date   HGBA1C 7.3 11/30/2022   Last thyroid functions Lab Results  Component Value Date   TSH 1.370 07/12/2022        Objective     Today's Vitals   11/30/22 1359  BP: (Abnormal) 148/86  Pulse: 68  SpO2: 95%  Weight: 240 lb 1.9 oz (108.9 kg)  Height: '5\' 6"'$  (1.676 m)   Body mass index is 38.76 kg/m.  BP Readings from Last 3 Encounters:  12/21/22 (Abnormal) 135/56  12/15/22 (Abnormal) 171/89  11/30/22 (Abnormal) 148/86    Wt Readings from Last 3 Encounters:  12/21/22 240 lb 8 oz (109.1 kg)  11/30/22 240 lb 1.9 oz (108.9 kg)  08/22/22 229 lb (103.9 kg)    Physical Exam Vitals and nursing note reviewed.  Constitutional:      Appearance: Normal appearance. He is well-developed.  HENT:     Head: Normocephalic and atraumatic.  Eyes:     Pupils: Pupils are equal, round, and reactive to light.  Neck:     Vascular: No carotid bruit.  Cardiovascular:     Rate and Rhythm: Normal rate and regular rhythm.     Pulses: Normal pulses.     Heart sounds: Normal heart sounds.  Pulmonary:     Effort: Pulmonary effort is normal.     Breath sounds: Normal breath sounds.  Abdominal:     Palpations: Abdomen is soft.  Musculoskeletal:        General: Normal range of motion.     Cervical back: Normal range of motion and neck supple.  Lymphadenopathy:     Cervical: No cervical adenopathy.  Skin:    General: Skin is warm and dry.     Capillary Refill: Capillary refill takes less than 2 seconds.  Neurological:     General: No focal deficit present.     Mental Status: He is alert and oriented to person, place, and time.  Psychiatric:        Attention and Perception: Attention and perception  normal.        Mood and Affect: Affect normal. Mood is depressed.        Speech: Speech normal.        Behavior: Behavior normal. Behavior is cooperative.        Thought Content: Thought content normal.        Cognition and Memory: Cognition and memory normal.        Judgment: Judgment normal.     Results for  orders placed or performed in visit on 11/30/22  POCT UA - Microalbumin  Result Value Ref Range   Microalbumin Ur, POC 150 mg/L   Creatinine, POC 50 mg/dL   Albumin/Creatinine Ratio, Urine, POC 300   POCT glycosylated hemoglobin (Hb A1C)  Result Value Ref Range   Hemoglobin A1C     HbA1c POC (<> result, manual entry) 7.3 4.0 - 5.6 %   HbA1c, POC (prediabetic range)     HbA1c, POC (controlled diabetic range)      Assessment & Plan    1. 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 Continue diabetic medication as prescribed. Limit intake of carbohydrates and sugar. Continue regular visits with nephrology. Refer to podiatry for diabetic foot care. - POCT UA - Microalbumin - POCT glycosylated hemoglobin (Hb A1C) - Ambulatory referral to Podiatry  2. Hypertension associated with type 2 diabetes mellitus (HCC) Bp generally stable. Continue bp medication as prescribed   3. Hyperlipidemia associated with type 2 diabetes mellitus (HCC) Lipid stable. Continue atorvastatin as prescribed   4. Depression, major, single episode, moderate ([place]) Patient verbally contracted for safety today. He plans to make appointment with psychiatry through [facility] system ASAP.   5. Acute midline low back pain without sciatica May take flexeril as needed and as previously prescribed   6. Erectile dysfunction, unspecified erectile dysfunction type Take sildenafil as needed and as prescribed   7. Vertigo Ok to continue meclizine as needed and as prescribed   8. Malignant neoplasm of  lung, unspecified laterality, unspecified part of lung [facility]) Patient continues to see oncology routinely for management.   9. Cigarette nicotine dependence without complication Start nicotine patches and reviewed instructions for use.    Problem List Items Addressed This Visit       Cardiovascular and Mediastinum   Hypertension associated with type 2 diabetes mellitus ([place])     Respiratory   Malignant neoplasm of lung (HCC)     Endocrine   DM (diabetes mellitus) ([place]) - Primary   Relevant Orders   POCT UA - Microalbumin (Completed)   POCT glycosylated hemoglobin (Hb A1C) (Completed)   Ambulatory referral to Podiatry   Hyperlipidemia associated with type 2 diabetes mellitus ([place])     Other   Back pain   Erectile dysfunction   Cigarette nicotine dependence without complication   Depression, major, single episode, moderate ([place])   Other Visit Diagnoses     Vertigo            Return in about 3 months (around 03/01/2023) for diabetes with HgbA1c check.         [name], NP  [facility] [phone] (phone) [phone] (fax)  [place]

## 2022-12-12 ENCOUNTER — Ambulatory Visit: Payer: Medicare PPO | Admitting: Podiatry

## 2022-12-15 ENCOUNTER — Encounter: Payer: Self-pay | Admitting: Internal Medicine

## 2022-12-15 ENCOUNTER — Encounter: Payer: Self-pay | Admitting: Pediatric Intensive Care

## 2022-12-15 NOTE — Progress Notes (Signed)
Client asks for call back regarding fresh food resources in [place]. Also asks for support using computer. Given number for [employer] [facility] [place]. Discussed importance of taking BP medication at same time daily as BP was elevated and he had not taken medication yet today.  Lisette Abu BSN RN [phone]

## 2022-12-19 ENCOUNTER — Ambulatory Visit (HOSPITAL_COMMUNITY)
Admission: RE | Admit: 2022-12-19 | Discharge: 2022-12-19 | Disposition: A | Discharge status: Home / Self Care | Payer: No Typology Code available for payment source | Source: Ambulatory Visit | Attending: Internal Medicine | Admitting: Internal Medicine

## 2022-12-19 ENCOUNTER — Inpatient Hospital Stay: Payer: No Typology Code available for payment source | Attending: Internal Medicine

## 2022-12-19 ENCOUNTER — Other Ambulatory Visit: Payer: Self-pay

## 2022-12-19 DIAGNOSIS — Z79899 Other long term (current) drug therapy: Secondary | ICD-10-CM | POA: Insufficient documentation

## 2022-12-19 DIAGNOSIS — C3491 Malignant neoplasm of unspecified part of right bronchus or lung: Secondary | ICD-10-CM

## 2022-12-19 DIAGNOSIS — C3411 Malignant neoplasm of upper lobe, right bronchus or lung: Secondary | ICD-10-CM | POA: Insufficient documentation

## 2022-12-19 DIAGNOSIS — C3490 Malignant neoplasm of unspecified part of unspecified bronchus or lung: Secondary | ICD-10-CM | POA: Diagnosis present

## 2022-12-19 DIAGNOSIS — Z95828 Presence of other vascular implants and grafts: Secondary | ICD-10-CM

## 2022-12-19 LAB — CBC WITH DIFFERENTIAL (CANCER CENTER ONLY)
Abs Immature Granulocytes: 0.05 10*3/uL (ref 0.00–0.07)
Basophils Absolute: 0 10*3/uL (ref 0.0–0.1)
Basophils Relative: 1 %
Eosinophils Absolute: 0.4 10*3/uL (ref 0.0–0.5)
Eosinophils Relative: 7 %
HCT: 39.9 % (ref 39.0–52.0)
Hemoglobin: 14.5 g/dL (ref 13.0–17.0)
Immature Granulocytes: 1 %
Lymphocytes Relative: 34 %
Lymphs Abs: 2.1 10*3/uL (ref 0.7–4.0)
MCH: 33.5 pg (ref 26.0–34.0)
MCHC: 36.3 g/dL — ABNORMAL HIGH (ref 30.0–36.0)
MCV: 92.1 fL (ref 80.0–100.0)
Monocytes Absolute: 0.6 10*3/uL (ref 0.1–1.0)
Monocytes Relative: 10 %
Neutro Abs: 2.9 10*3/uL (ref 1.7–7.7)
Neutrophils Relative %: 47 %
Platelet Count: 162 10*3/uL (ref 150–400)
RBC: 4.33 MIL/uL (ref 4.22–5.81)
RDW: 14 % (ref 11.5–15.5)
WBC Count: 6.1 10*3/uL (ref 4.0–10.5)
nRBC: 0 % (ref 0.0–0.2)

## 2022-12-19 LAB — CMP (CANCER CENTER ONLY)
ALT: 31 U/L (ref 0–44)
AST: 18 U/L (ref 15–41)
Albumin: 4.1 g/dL (ref 3.5–5.0)
Alkaline Phosphatase: 54 U/L (ref 38–126)
Anion gap: 5 (ref 5–15)
BUN: 38 mg/dL — ABNORMAL HIGH (ref 8–23)
CO2: 27 mmol/L (ref 22–32)
Calcium: 10.4 mg/dL — ABNORMAL HIGH (ref 8.9–10.3)
Chloride: 104 mmol/L (ref 98–111)
Creatinine: 2.25 mg/dL — ABNORMAL HIGH (ref 0.61–1.24)
GFR, Estimated: 31 mL/min — ABNORMAL LOW (ref >60)
Glucose, Bld: 121 mg/dL — ABNORMAL HIGH (ref 70–99)
Potassium: 4.4 mmol/L (ref 3.5–5.1)
Sodium: 136 mmol/L (ref 135–145)
Total Bilirubin: 0.3 mg/dL (ref 0.3–1.2)
Total Protein: 6.7 g/dL (ref 6.5–8.1)

## 2022-12-19 MED ORDER — HEPARIN SOD (PORK) LOCK FLUSH 100 UNIT/ML IV SOLN
500.0000 [IU] | Freq: Once | INTRAVENOUS | Status: AC
  Administered 2022-12-19: 500 [IU]

## 2022-12-19 MED ORDER — SODIUM CHLORIDE 0.9% FLUSH
10.0000 mL | Freq: Once | INTRAVENOUS | Status: AC
  Administered 2022-12-19: 10 mL

## 2022-12-20 ENCOUNTER — Encounter: Payer: Self-pay | Admitting: Podiatry

## 2022-12-20 ENCOUNTER — Ambulatory Visit (INDEPENDENT_AMBULATORY_CARE_PROVIDER_SITE_OTHER): Payer: Medicare PPO | Admitting: Podiatry

## 2022-12-20 DIAGNOSIS — E1165 Type 2 diabetes mellitus with hyperglycemia: Secondary | ICD-10-CM

## 2022-12-20 DIAGNOSIS — B351 Tinea unguium: Secondary | ICD-10-CM

## 2022-12-20 DIAGNOSIS — M79674 Pain in right toe(s): Secondary | ICD-10-CM

## 2022-12-20 DIAGNOSIS — B353 Tinea pedis: Secondary | ICD-10-CM

## 2022-12-20 DIAGNOSIS — M79675 Pain in left toe(s): Secondary | ICD-10-CM | POA: Diagnosis not present

## 2022-12-20 MED ORDER — KETOCONAZOLE 2 % EX CREA: 1.0000 | TOPICAL_CREAM | Freq: Every day | CUTANEOUS | 2 refills | Status: DC

## 2022-12-20 NOTE — Progress Notes (Signed)
  Subjective:  Patient ID: Kevin Lambert., male    DOB: 06-Oct-1956,   MRN: [id number]  Chief Complaint  Patient presents with   Nail Problem    [facility] A1C-7.3    67 y.o. male presents for concern of thickened elongated and painful nails that are difficult to trim. Requesting to have them trimmed today. Relates burning and tingling in their feet. Patient is diabetic and last A1c was  Lab Results  Component Value Date   HGBA1C 7.3 11/30/2022   .   PCP:  [name], NP    . Denies any other pedal complaints. Denies n/v/f/c.   Past Medical History:  Diagnosis Date   Arthritis    Chronic kidney disease    Depression    DM (diabetes mellitus) ([place])    type II   Dyslipidemia    Dyspnea    unable to walk much - he thinks it is from    GERD (gastroesophageal reflux disease)    Hepatitis    Hepatitis C- treated   History of kidney stones    HTN (hypertension)    Neuropathy    nscl ca dx'd 10/2019   OSA on CPAP    PTSD (post-traumatic stress disorder)     Objective:  Physical Exam: Vascular: DP/PT pulses 2/4 bilateral. CFT <3 seconds. Absent hair growth on digits. Edema noted to bilateral lower extremities. Xerosis noted bilaterally.  Skin. No lacerations or abrasions bilateral feet. Nails 1-5 bilateral  are thickened discolored and elongated with subungual debris.  Musculoskeletal: MMT 5/5 bilateral lower extremities in DF, PF, Inversion and Eversion. Deceased ROM in DF of ankle joint.  Neurological: Sensation intact to light touch. Protective sensation diminished bilateral.    Assessment:   1. Pain due to onychomycosis of toenails of both feet   2. Type 2 diabetes mellitus with hyperglycemia, without long-term current use of insulin (HCC)   3. Tinea pedis of both feet      Plan:  Patient was evaluated and treated and all questions answered. -Discussed and educated patient on diabetic foot care, especially with  regards to the vascular, neurological and  musculoskeletal systems.  -Stressed the importance of good glycemic control and the detriment of not  controlling glucose levels in relation to the foot. -Discussed supportive shoes at all times and checking feet regularly.  -Mechanically debrided all nails 1-5 bilateral using sterile nail nipper and filed with dremel without incident  -Answered all patient questions -Ketoconazole sent for tinea.  -Patient to return  in 3 months for at risk foot care -Patient advised to call the office if any problems or questions arise in the meantime.   Lorenda Peck, DPM

## 2022-12-21 ENCOUNTER — Inpatient Hospital Stay (HOSPITAL_BASED_OUTPATIENT_CLINIC_OR_DEPARTMENT_OTHER): Payer: No Typology Code available for payment source | Admitting: Internal Medicine

## 2022-12-21 ENCOUNTER — Other Ambulatory Visit: Payer: Self-pay

## 2022-12-21 VITALS — BP 135/56 | HR 74 | Temp 98.5°F | Resp 18 | Wt 240.5 lb

## 2022-12-21 DIAGNOSIS — C3490 Malignant neoplasm of unspecified part of unspecified bronchus or lung: Secondary | ICD-10-CM | POA: Diagnosis not present

## 2022-12-21 DIAGNOSIS — C3411 Malignant neoplasm of upper lobe, right bronchus or lung: Secondary | ICD-10-CM | POA: Diagnosis not present

## 2022-12-21 NOTE — Progress Notes (Signed)
[facility] [phone]   [phone]  OFFICE PROGRESS NOTE  Kevin Freshwater, NP [place] [address]  DIAGNOSIS: Stage IV (T2b, N3, M1a) non-small cell lung cancer, adenocarcinoma presented with large right upper lobe lung mass in addition to mediastinal and right supraclavicular lymphadenopathy as well as bilateral pulmonary nodules diagnosed in February 2021.  MOLECULAR STUDY by [employer]:  KRASG12C, 4.3%, Binimetinib  [id number], 0.9%, Niraparib, Olaparib, Rucaparib,Talazoparib, Tazemetostat  [id number], 1.7%, None   PRIOR THERAPY: Systemic chemotherapy with carboplatin for AUC of 5, Alimta 500 mg/M2 and Keytruda 200 mg IV every 3 weeks.  First dose December 31, 2019.  Status post 31 cycles.  The last few cycles he has been treated with single agent Keytruda secondary to renal insufficiency.  This treatment was discontinued secondary to immunotherapy mediated colitis and diarrhea  CURRENT THERAPY: Observation.  INTERVAL HISTORY: Kevin Lambert. 67 y.o. male returns to the neck today for follow-up visit.  The patient is feeling fine today with no concerning complaints.  He denied having any current chest pain but has mild shortness of breath with exertion with no cough or hemoptysis.  He has no nausea, vomiting, diarrhea or constipation.  He has no headache or visual changes.  He has no weight loss or night sweats.  He is here today for evaluation with repeat CT scan of the chest, abdomen and pelvis for restaging of his disease.  MEDICAL HISTORY: Past Medical History:  Diagnosis Date   Arthritis    Chronic kidney disease    Depression    DM (diabetes mellitus) ([place])    type II   Dyslipidemia    Dyspnea    unable to walk much - he thinks it is from    GERD (gastroesophageal reflux disease)    Hepatitis    Hepatitis C- treated   History of kidney stones    HTN (hypertension)    Neuropathy    nscl ca dx'd  10/2019   OSA on CPAP    PTSD (post-traumatic stress disorder)     ALLERGIES:  is allergic to metformin and related, naproxen, and oxycontin [oxycodone].  MEDICATIONS:  Current Outpatient Medications  Medication Sig Dispense Refill   amLODipine (NORVASC) 10 MG tablet Take 1 tablet (10 mg total) by mouth daily. 90 tablet 1   Ascorbic Acid (VITAMIN C) 1000 MG tablet Take 1,000 mg by mouth daily.     aspirin EC 81 MG tablet Take 81 mg by mouth daily.     atorvastatin (LIPITOR) 10 MG tablet Take 1 tablet (10 mg total) by mouth daily. 90 tablet 1   blood glucose meter kit and supplies Dispense based on patient and insurance preference. Patient to check blood sugars daily, prior to eating and also as needed (FOR ICD-10 E10.9, E11.9). 1 each 0   carvedilol (COREG) 3.125 MG tablet Take 1 tablet (3.125 mg total) by mouth 2 (two) times daily with a meal. 180 tablet 1   cholecalciferol (VITAMIN D3) 25 MCG (1000 UNIT) tablet Take 1,000 Units by mouth daily.     Cyanocobalamin (VITAMIN B 12 PO) Take 1 tablet by mouth daily.     cyclobenzaprine (FLEXERIL) 10 MG tablet Take 1 tablet (10 mg total) by mouth at bedtime as needed for muscle spasms. 30 tablet 2   empagliflozin (JARDIANCE) 25 MG TABS tablet Take 1 tablet (25 mg total) by mouth daily before breakfast. 90 tablet 1  ketoconazole (NIZORAL) 2 % cream Apply 1 Application topically daily. 60 g 2   magic mouthwash SOLN Take 5 mLs by mouth 4 (four) times daily as needed for mouth pain. Swish and swallow or spit 240 mL 2   meclizine (ANTIVERT) 25 MG tablet TAKE ONE-HALF TABLET BY MOUTH THREE TIMES A DAY AS NEEDED FOR DIZZINESS 30 tablet 2   nicotine (NICODERM CQ - DOSED IN MG/24 HOURS) 21 mg/24hr patch Place 1 patch (21 mg total) onto the skin daily. 30 patch 2   omeprazole (PRILOSEC) 20 MG capsule Take 1 capsule (20 mg total) by mouth daily. 30 capsule 1   prochlorperazine (COMPAZINE) 10 MG tablet Take 1 tablet (10 mg total) by mouth every 6 (six) hours  as needed. 30 tablet 2   Propylene Glycol (SYSTANE BALANCE OP) Place 1 drop into both eyes daily.     Semaglutide,0.25 or 0.5MG /DOS, 2 MG/3ML SOPN Inject 0.25 mg into the skin once a week. 3 mL 2   sildenafil (VIAGRA) 100 MG tablet TAKE ONE TABLET BY MOUTH AS INSTRUCTED (TAKE 1 HOUR PRIOR TO SEXUAL ACTIVITY *DO NOT EXCEED 1 DOSE PER 24 HOUR PERIOD*) 10 tablet 5   triamcinolone cream (KENALOG) 0.5 % Apply 1 Application topically 2 (two) times daily. 454 g 1   triamterene-hydrochlorothiazide (MAXZIDE-25) 37.5-25 MG tablet Take 1 tablet by mouth daily. 90 tablet 1   zinc gluconate 50 MG tablet Take 50 mg by mouth daily.     No current facility-administered medications for this visit.    SURGICAL HISTORY:  Past Surgical History:  Procedure Laterality Date   BIOPSY OF MEDIASTINAL MASS  12/10/2019   Procedure: BIOPSY OF MEDIASTINAL MASS;  Surgeon: Garner Nash, DO;  Location: [facility];  Service: Pulmonary;;  right upper lobe   BRONCHIAL NEEDLE ASPIRATION BIOPSY  12/10/2019   Procedure: BRONCHIAL NEEDLE ASPIRATION BIOPSIES;  Surgeon: Garner Nash, DO;  Location: [facility];  Service: Pulmonary;;   COLONOSCOPY     IR IMAGING GUIDED PORT INSERTION  12/31/2019   kidney stone     URETERAL STENT PLACEMENT     Ureteral Stent Removed     VIDEO BRONCHOSCOPY WITH ENDOBRONCHIAL ULTRASOUND N/A 12/10/2019   Procedure: VIDEO BRONCHOSCOPY WITH ENDOBRONCHIAL ULTRASOUND;  Surgeon: Garner Nash, DO;  Location: [facility];  Service: Pulmonary;  Laterality: N/A;    REVIEW OF SYSTEMS:  A comprehensive review of systems was negative except for: Respiratory: positive for dyspnea on exertion   PHYSICAL EXAMINATION: General appearance: alert, cooperative, and no distress Head: Normocephalic, without obvious abnormality, atraumatic Neck: no adenopathy, no JVD, supple, symmetrical, trachea midline, and thyroid not enlarged, symmetric, no tenderness/mass/nodules Lymph nodes: Cervical, supraclavicular, and  axillary nodes normal. Resp: clear to auscultation bilaterally Back: symmetric, no curvature. ROM normal. No CVA tenderness. Cardio: regular rate and rhythm, S1, S2 normal, no murmur, click, rub or gallop GI: soft, non-tender; bowel sounds normal; no masses,  no organomegaly Extremities: extremities normal, atraumatic, no cyanosis or edema  ECOG PERFORMANCE STATUS: 0 - Asymptomatic  Blood pressure (!) 135/56, pulse 74, temperature 98.5 F (36.9 C), temperature source Oral, resp. rate 18, weight 240 lb 8 oz (109.1 kg), SpO2 92 %.  LABORATORY DATA: Lab Results  Component Value Date   WBC 6.1 12/19/2022   HGB 14.5 12/19/2022   HCT 39.9 12/19/2022   MCV 92.1 12/19/2022   PLT 162 12/19/2022      Chemistry      Component Value Date/Time   NA 136 12/19/2022 1455  NA 137 07/12/2022 1115   K 4.4 12/19/2022 1455   CL 104 12/19/2022 1455   CO2 27 12/19/2022 1455   BUN 38 (H) 12/19/2022 1455   BUN 28 (H) 07/12/2022 1115   CREATININE 2.25 (H) 12/19/2022 1455      Component Value Date/Time   CALCIUM 10.4 (H) 12/19/2022 1455   ALKPHOS 54 12/19/2022 1455   AST 18 12/19/2022 1455   ALT 31 12/19/2022 1455   BILITOT 0.3 12/19/2022 1455       RADIOGRAPHIC STUDIES: CT Abdomen Pelvis Wo Contrast  Result Date: 12/20/2022 CLINICAL DATA:  Non-small cell lung cancer restaging. * Tracking Code: BO * EXAM: CT CHEST, ABDOMEN AND PELVIS WITHOUT CONTRAST TECHNIQUE: Multidetector CT imaging of the chest, abdomen and pelvis was performed following the standard protocol without IV contrast. RADIATION DOSE REDUCTION: This exam was performed according to the departmental dose-optimization program which includes automated exposure control, adjustment of the mA and/or kV according to patient size and/or use of iterative reconstruction technique. COMPARISON:  08/19/2022 FINDINGS: CT CHEST FINDINGS Cardiovascular: Heart size appears within normal limits. No pericardial effusion. Aortic atherosclerosis and  multi vessel coronary artery calcifications. Mediastinum/Nodes: No enlarged mediastinal or axillary lymph nodes. Hilar lymph nodes are suboptimally evaluated due to lack of IV contrast. Thyroid gland, trachea, and esophagus are unremarkable. Lungs/Pleura: No pleural fluid. Treated tumor within the posterior right upper lobe along the major fissure measures 3.2 x 1.3 by 1.1 cm, image 58/2 and image 66/6. On the previous exam this measured the same. Tiny peripheral nodule within the posterolateral left lower lobe is unchanged, image 81/7 measuring 2-3 mm. No new or suspicious findings within the lungs. Musculoskeletal: Spondylosis identified within the thoracic spine. No acute or suspicious osseous findings. CT ABDOMEN PELVIS FINDINGS Hepatobiliary: No focal liver abnormality is seen. No gallstones, gallbladder wall thickening, or biliary dilatation. Pancreas: Unremarkable. No pancreatic ductal dilatation or surrounding inflammatory changes. Spleen: Normal in size without focal abnormality. Adrenals/Urinary Tract: Stable 2.3 cm right adrenal gland adenoma, image 17/2. Left adrenal gland adenoma measuring 1.7 cm is also unchanged. No follow-up imaging recommended. Similar appearance of bilateral Bosniak class 1 kidney cysts. No follow-up imaging recommended. Nonobstructing stone within the lower pole of the left kidney measures 4 mm, image 30/2. No hydronephrosis identified. Urinary bladder is unremarkable. Stomach/Bowel: Stomach appears normal. The appendix is visualized and appears normal. No bowel wall thickening, inflammation, or distension. Vascular/Lymphatic: Aortic atherosclerosis. No aneurysm. No signs of abdominopelvic adenopathy. Reproductive: Prostate gland enlargement. Other: No free fluid or fluid collections identified Musculoskeletal: Spondylosis identified within the thoracic spine. No acute or suspicious osseous findings. IMPRESSION: 1. Stable appearance of treated tumor within the posterior right  upper lobe along the major fissure. No new or suspicious findings identified within the chest, abdomen or pelvis. 2. Stable bilateral adrenal gland adenomas. No follow-up imaging recommended. None 3. Nonobstructing left renal calculus. 4. Prostate gland enlargement. 5.  Aortic Atherosclerosis (ICD10-I70.0). Electronically Signed   By: Kerby Moors M.D.   On: 12/20/2022 11:23   CT Chest Wo Contrast  Result Date: 12/20/2022 CLINICAL DATA:  Non-small cell lung cancer restaging. * Tracking Code: BO * EXAM: CT CHEST, ABDOMEN AND PELVIS WITHOUT CONTRAST TECHNIQUE: Multidetector CT imaging of the chest, abdomen and pelvis was performed following the standard protocol without IV contrast. RADIATION DOSE REDUCTION: This exam was performed according to the departmental dose-optimization program which includes automated exposure control, adjustment of the mA and/or kV according to patient size and/or use of iterative reconstruction  technique. COMPARISON:  08/19/2022 FINDINGS: CT CHEST FINDINGS Cardiovascular: Heart size appears within normal limits. No pericardial effusion. Aortic atherosclerosis and multi vessel coronary artery calcifications. Mediastinum/Nodes: No enlarged mediastinal or axillary lymph nodes. Hilar lymph nodes are suboptimally evaluated due to lack of IV contrast. Thyroid gland, trachea, and esophagus are unremarkable. Lungs/Pleura: No pleural fluid. Treated tumor within the posterior right upper lobe along the major fissure measures 3.2 x 1.3 by 1.1 cm, image 58/2 and image 66/6. On the previous exam this measured the same. Tiny peripheral nodule within the posterolateral left lower lobe is unchanged, image 81/7 measuring 2-3 mm. No new or suspicious findings within the lungs. Musculoskeletal: Spondylosis identified within the thoracic spine. No acute or suspicious osseous findings. CT ABDOMEN PELVIS FINDINGS Hepatobiliary: No focal liver abnormality is seen. No gallstones, gallbladder wall  thickening, or biliary dilatation. Pancreas: Unremarkable. No pancreatic ductal dilatation or surrounding inflammatory changes. Spleen: Normal in size without focal abnormality. Adrenals/Urinary Tract: Stable 2.3 cm right adrenal gland adenoma, image 17/2. Left adrenal gland adenoma measuring 1.7 cm is also unchanged. No follow-up imaging recommended. Similar appearance of bilateral Bosniak class 1 kidney cysts. No follow-up imaging recommended. Nonobstructing stone within the lower pole of the left kidney measures 4 mm, image 30/2. No hydronephrosis identified. Urinary bladder is unremarkable. Stomach/Bowel: Stomach appears normal. The appendix is visualized and appears normal. No bowel wall thickening, inflammation, or distension. Vascular/Lymphatic: Aortic atherosclerosis. No aneurysm. No signs of abdominopelvic adenopathy. Reproductive: Prostate gland enlargement. Other: No free fluid or fluid collections identified Musculoskeletal: Spondylosis identified within the thoracic spine. No acute or suspicious osseous findings. IMPRESSION: 1. Stable appearance of treated tumor within the posterior right upper lobe along the major fissure. No new or suspicious findings identified within the chest, abdomen or pelvis. 2. Stable bilateral adrenal gland adenomas. No follow-up imaging recommended. None 3. Nonobstructing left renal calculus. 4. Prostate gland enlargement. 5.  Aortic Atherosclerosis (ICD10-I70.0). Electronically Signed   By: Kerby Moors M.D.   On: 12/20/2022 11:23     ASSESSMENT AND PLAN: This is a very pleasant 67 years old African-American male recently diagnosed with a stage IV non-small cell lung cancer, adenocarcinoma with positive KRAS G12C mutation presented with large right lower lobe lung mass in addition to mediastinal and right supraclavicular lymphadenopathy as well as bilateral pulmonary nodules diagnosed in February 2021. I explained to the patient that he has incurable condition and all  the treatment options will be of palliative nature. The patient has no actionable mutations on the recent molecular studies and he is not a candidate for the [facility] clinical trial. He started systemic chemotherapy with carboplatin, Alimta and Keytruda status post 31 cycles.  Starting from cycle #5 the patient is on maintenance treatment with Alimta and Keytruda every 3 weeks.  He has been recently on single agent Keytruda for several cycles secondary to renal insufficiency. His treatment was discontinued secondary to immunotherapy mediated colitis with diarrhea. The patient is currently on observation and he is feeling fine with no concerning complaints. He had repeat CT scan of the chest, abdomen and pelvis performed recently.  I personally and independently reviewed the scans and discussed the result with the patient today. His scan showed no concerning findings for disease recurrence or metastasis. I recommended for him to continue on observation with repeat CT scan of the chest, abdomen and pelvis in 4 months. The patient will continue to have Port-A-Cath flush every 2 months. For the renal insufficiency, he is followed by nephrology.  The patient was advised to call immediately if he has any other concerning symptoms in the interval.  The patient voices understanding of current disease status and treatment options and is in agreement with the current care plan.  All questions were answered. The patient knows to call the clinic with any problems, questions or concerns. We can certainly see the patient much sooner if necessary.  Disclaimer: This note was dictated with voice recognition software. Similar sounding words can inadvertently be transcribed and may not be corrected upon review.

## 2022-12-30 ENCOUNTER — Other Ambulatory Visit: Payer: Self-pay | Admitting: Nurse Practitioner

## 2022-12-30 DIAGNOSIS — E782 Mixed hyperlipidemia: Secondary | ICD-10-CM

## 2022-12-30 DIAGNOSIS — N529 Male erectile dysfunction, unspecified: Secondary | ICD-10-CM

## 2022-12-30 DIAGNOSIS — R197 Diarrhea, unspecified: Secondary | ICD-10-CM

## 2022-12-30 DIAGNOSIS — R42 Dizziness and giddiness: Secondary | ICD-10-CM

## 2022-12-30 DIAGNOSIS — F17210 Nicotine dependence, cigarettes, uncomplicated: Secondary | ICD-10-CM

## 2022-12-30 DIAGNOSIS — I10 Essential (primary) hypertension: Secondary | ICD-10-CM

## 2022-12-30 DIAGNOSIS — E1122 Type 2 diabetes mellitus with diabetic chronic kidney disease: Secondary | ICD-10-CM

## 2022-12-30 DIAGNOSIS — M545 Low back pain, unspecified: Secondary | ICD-10-CM

## 2022-12-30 MED ORDER — CYCLOBENZAPRINE HCL 10 MG PO TABS: 10.0000 mg | ORAL_TABLET | Freq: Every evening | ORAL | 2 refills | Status: DC | PRN

## 2022-12-30 MED ORDER — OMEPRAZOLE 20 MG PO CPDR: 20.0000 mg | DELAYED_RELEASE_CAPSULE | Freq: Every day | ORAL | 1 refills | Status: DC

## 2022-12-30 MED ORDER — SEMAGLUTIDE(0.25 OR 0.5MG/DOS) 2 MG/3ML ~~LOC~~ SOPN: 0.2500 mg | PEN_INJECTOR | SUBCUTANEOUS | 2 refills | Status: DC

## 2022-12-30 MED ORDER — TRIAMTERENE-HCTZ 37.5-25 MG PO TABS: 1.0000 | ORAL_TABLET | Freq: Every day | ORAL | 1 refills | Status: DC

## 2022-12-30 MED ORDER — CARVEDILOL 3.125 MG PO TABS: 3.1250 mg | ORAL_TABLET | Freq: Two times a day (BID) | ORAL | 1 refills | Status: DC

## 2022-12-30 MED ORDER — NICOTINE 21 MG/24HR TD PT24: 21.0000 mg | MEDICATED_PATCH | Freq: Every day | TRANSDERMAL | 2 refills | Status: DC

## 2022-12-30 MED ORDER — MECLIZINE HCL 25 MG PO TABS: ORAL_TABLET | ORAL | 2 refills | Status: DC

## 2022-12-30 MED ORDER — EMPAGLIFLOZIN 25 MG PO TABS: 25.0000 mg | ORAL_TABLET | Freq: Every day | ORAL | 1 refills | Status: DC

## 2022-12-30 MED ORDER — AMLODIPINE BESYLATE 10 MG PO TABS: 10.0000 mg | ORAL_TABLET | Freq: Every day | ORAL | 1 refills | Status: DC

## 2022-12-30 MED ORDER — SILDENAFIL CITRATE 100 MG PO TABS: ORAL_TABLET | ORAL | 5 refills | Status: DC

## 2022-12-30 MED ORDER — ATORVASTATIN CALCIUM 10 MG PO TABS: 10.0000 mg | ORAL_TABLET | Freq: Every day | ORAL | 1 refills | Status: DC

## 2022-12-31 DIAGNOSIS — F321 Major depressive disorder, single episode, moderate: Secondary | ICD-10-CM | POA: Insufficient documentation

## 2022-12-31 DIAGNOSIS — E1169 Type 2 diabetes mellitus with other specified complication: Secondary | ICD-10-CM | POA: Insufficient documentation

## 2023-01-09 ENCOUNTER — Telehealth: Payer: Self-pay | Admitting: *Deleted

## 2023-01-09 NOTE — Telephone Encounter (Signed)
Resent renewal paper with office notes to the fax numbers provided.  Pt needs to renew his [facility] before medication can be filled. Haeli Gerlich Zimmerman Rumple, CMA

## 2023-01-12 ENCOUNTER — Telehealth: Payer: Self-pay | Admitting: *Deleted

## 2023-01-12 ENCOUNTER — Telehealth: Payer: Self-pay | Admitting: Internal Medicine

## 2023-01-12 NOTE — Telephone Encounter (Signed)
Called patient regarding upcoming appointments, patient is notified. 

## 2023-01-12 NOTE — Telephone Encounter (Signed)
Pt calling inquiring about his Rx's. Informed him that I have faxed the information to the [facility] group with the [place] and they should be contacting him to recertify him.  I have not received anything else from them to complete. Will wait for patient to call back to see if there are any other steps we need to do on our part.

## 2023-01-16 DIAGNOSIS — L309 Dermatitis, unspecified: Secondary | ICD-10-CM | POA: Diagnosis not present

## 2023-01-16 DIAGNOSIS — E559 Vitamin D deficiency, unspecified: Secondary | ICD-10-CM | POA: Diagnosis not present

## 2023-01-16 DIAGNOSIS — Z1159 Encounter for screening for other viral diseases: Secondary | ICD-10-CM | POA: Diagnosis not present

## 2023-01-16 DIAGNOSIS — E1169 Type 2 diabetes mellitus with other specified complication: Secondary | ICD-10-CM | POA: Diagnosis not present

## 2023-01-16 DIAGNOSIS — C3490 Malignant neoplasm of unspecified part of unspecified bronchus or lung: Secondary | ICD-10-CM | POA: Diagnosis not present

## 2023-01-16 DIAGNOSIS — M79606 Pain in leg, unspecified: Secondary | ICD-10-CM | POA: Diagnosis not present

## 2023-01-16 DIAGNOSIS — Z125 Encounter for screening for malignant neoplasm of prostate: Secondary | ICD-10-CM | POA: Diagnosis not present

## 2023-01-16 DIAGNOSIS — Z79899 Other long term (current) drug therapy: Secondary | ICD-10-CM | POA: Diagnosis not present

## 2023-02-21 ENCOUNTER — Inpatient Hospital Stay: Payer: No Typology Code available for payment source | Attending: Internal Medicine

## 2023-02-21 ENCOUNTER — Inpatient Hospital Stay: Payer: No Typology Code available for payment source

## 2023-02-21 DIAGNOSIS — C3411 Malignant neoplasm of upper lobe, right bronchus or lung: Secondary | ICD-10-CM | POA: Insufficient documentation

## 2023-02-21 DIAGNOSIS — Z452 Encounter for adjustment and management of vascular access device: Secondary | ICD-10-CM | POA: Insufficient documentation

## 2023-02-28 ENCOUNTER — Telehealth: Payer: Self-pay | Admitting: Internal Medicine

## 2023-03-01 ENCOUNTER — Telehealth: Payer: Self-pay

## 2023-03-01 ENCOUNTER — Emergency Department (HOSPITAL_COMMUNITY): Payer: No Typology Code available for payment source

## 2023-03-01 ENCOUNTER — Ambulatory Visit: Payer: Medicare PPO | Admitting: Nurse Practitioner

## 2023-03-01 ENCOUNTER — Emergency Department (HOSPITAL_COMMUNITY)
Admission: EM | Admit: 2023-03-01 | Discharge: 2023-03-01 | Disposition: A | Discharge status: Home / Self Care | Payer: No Typology Code available for payment source | Attending: Emergency Medicine | Admitting: Emergency Medicine

## 2023-03-01 ENCOUNTER — Encounter (HOSPITAL_COMMUNITY): Payer: Self-pay

## 2023-03-01 ENCOUNTER — Inpatient Hospital Stay: Payer: No Typology Code available for payment source

## 2023-03-01 ENCOUNTER — Other Ambulatory Visit: Payer: Self-pay

## 2023-03-01 DIAGNOSIS — M25561 Pain in right knee: Secondary | ICD-10-CM | POA: Diagnosis present

## 2023-03-01 DIAGNOSIS — Z79899 Other long term (current) drug therapy: Secondary | ICD-10-CM | POA: Diagnosis not present

## 2023-03-01 DIAGNOSIS — Z7982 Long term (current) use of aspirin: Secondary | ICD-10-CM | POA: Diagnosis not present

## 2023-03-01 DIAGNOSIS — E1122 Type 2 diabetes mellitus with diabetic chronic kidney disease: Secondary | ICD-10-CM | POA: Insufficient documentation

## 2023-03-01 DIAGNOSIS — X509XXA Other and unspecified overexertion or strenuous movements or postures, initial encounter: Secondary | ICD-10-CM | POA: Diagnosis not present

## 2023-03-01 DIAGNOSIS — N189 Chronic kidney disease, unspecified: Secondary | ICD-10-CM | POA: Insufficient documentation

## 2023-03-01 DIAGNOSIS — C3491 Malignant neoplasm of unspecified part of right bronchus or lung: Secondary | ICD-10-CM

## 2023-03-01 DIAGNOSIS — I129 Hypertensive chronic kidney disease with stage 1 through stage 4 chronic kidney disease, or unspecified chronic kidney disease: Secondary | ICD-10-CM | POA: Diagnosis not present

## 2023-03-01 DIAGNOSIS — C3411 Malignant neoplasm of upper lobe, right bronchus or lung: Secondary | ICD-10-CM | POA: Diagnosis present

## 2023-03-01 DIAGNOSIS — Z95828 Presence of other vascular implants and grafts: Secondary | ICD-10-CM

## 2023-03-01 DIAGNOSIS — Z452 Encounter for adjustment and management of vascular access device: Secondary | ICD-10-CM | POA: Diagnosis not present

## 2023-03-01 HISTORY — DX: Malignant neoplasm of unspecified part of unspecified bronchus or lung: C34.90

## 2023-03-01 MED ORDER — HEPARIN SOD (PORK) LOCK FLUSH 100 UNIT/ML IV SOLN
500.0000 [IU] | Freq: Once | INTRAVENOUS | Status: AC
  Administered 2023-03-01: 500 [IU]

## 2023-03-01 MED ORDER — SODIUM CHLORIDE 0.9% FLUSH
10.0000 mL | Freq: Once | INTRAVENOUS | Status: AC
  Administered 2023-03-01: 10 mL

## 2023-03-01 NOTE — Telephone Encounter (Signed)
We didn't order anything in January. And we have tried multiple times to send things to the [place] for him. I'm sorry he feels that way.

## 2023-03-01 NOTE — Telephone Encounter (Signed)
Im sorry to hear that.  °

## 2023-03-01 NOTE — Telephone Encounter (Signed)
Pateint called to cancel todays appointment, and to inform office that he is changing PCP'S, thanks.

## 2023-03-01 NOTE — Discharge Instructions (Signed)
You were evaluated today for right sided knee pain. Please use the knee brace and crutches.  You may take up to 1000 mg of acetaminophen (2 extra strength tablets) every 6 hours.  Do not exceed this amount.  Please follow-up with orthopedic surgery.

## 2023-03-01 NOTE — ED Provider Notes (Signed)
[place] EMERGENCY DEPARTMENT AT [facility] Provider Note   CSN: [id number] Arrival date & time: 03/01/23  1329     History  Chief Complaint  Patient presents with   Knee Pain    Kevin Lambert. is a 67 y.o. male.  Patient presents the emergency department complaining of right-sided knee pain.  Patient states he has a remote history of meniscal tear on the right side.  He states yesterday he accidentally hyperextended the right knee and began to have severe pain.  It hurts to bear weight at this time.  He denies falling or any other injuries.  Patient currently does not have orthopedic care.  Past medical history significant for type II DM, hypertension, arthritis, chronic kidney disease  HPI     Home Medications Prior to Admission medications   Medication Sig Start Date End Date Taking? Authorizing Provider  amLODipine (NORVASC) 10 MG tablet Take 1 tablet (10 mg total) by mouth daily. 12/30/22   [name], NP  Ascorbic Acid (VITAMIN C) 1000 MG tablet Take 1,000 mg by mouth daily.    [provider]  aspirin EC 81 MG tablet Take 81 mg by mouth daily.    [provider]  atorvastatin (LIPITOR) 10 MG tablet Take 1 tablet (10 mg total) by mouth daily. 12/30/22   [name], NP  blood glucose meter kit and supplies Dispense based on patient and insurance preference. Patient to check blood sugars daily, prior to eating and also as needed (FOR ICD-10 E10.9, E11.9). 02/22/22   [name], NP  carvedilol (COREG) 3.125 MG tablet Take 1 tablet (3.125 mg total) by mouth 2 (two) times daily with a meal. 12/30/22   [name], NP  cholecalciferol (VITAMIN D3) 25 MCG (1000 UNIT) tablet Take 1,000 Units by mouth daily.    [provider]  Cyanocobalamin (VITAMIN B 12 PO) Take 1 tablet by mouth daily.    [provider]  cyclobenzaprine (FLEXERIL) 10 MG tablet Take 1 tablet (10 mg total) by mouth at bedtime as needed for  muscle spasms. 12/30/22   [name], NP  empagliflozin (JARDIANCE) 25 MG TABS tablet Take 1 tablet (25 mg total) by mouth daily before breakfast. 12/30/22   [name], NP  ketoconazole (NIZORAL) 2 % cream Apply 1 Application topically daily. 12/20/22   Louann Sjogren, DPM  magic mouthwash SOLN Take 5 mLs by mouth 4 (four) times daily as needed for mouth pain. Swish and swallow or spit 01/28/20   Tanner, Kathrin Greathouse., PA-C  meclizine (ANTIVERT) 25 MG tablet TAKE ONE-HALF TABLET BY MOUTH THREE TIMES A DAY AS NEEDED FOR DIZZINESS 12/30/22   [name], NP  nicotine (NICODERM CQ - DOSED IN MG/24 HOURS) 21 mg/24hr patch Place 1 patch (21 mg total) onto the skin daily. 12/30/22   [name], NP  omeprazole (PRILOSEC) 20 MG capsule Take 1 capsule (20 mg total) by mouth daily. 12/30/22   [name], NP  prochlorperazine (COMPAZINE) 10 MG tablet Take 1 tablet (10 mg total) by mouth every 6 (six) hours as needed. 11/18/21   Heilingoetter, Cassandra L, PA-C  Propylene Glycol (SYSTANE BALANCE OP) Place 1 drop into both eyes daily.    [provider]  Semaglutide,0.25 or 0.5MG /DOS, 2 MG/3ML SOPN Inject 0.25 mg into the skin once a week. 12/30/22   [name], NP  sildenafil (VIAGRA) 100 MG tablet TAKE ONE TABLET BY MOUTH AS INSTRUCTED (TAKE  1 HOUR PRIOR TO SEXUAL ACTIVITY *DO NOT EXCEED 1 DOSE PER 24 HOUR PERIOD*) 12/30/22   [name], NP  triamcinolone cream (KENALOG) 0.5 % Apply 1 Application topically 2 (two) times daily. 05/26/22   [name], NP  triamterene-hydrochlorothiazide (MAXZIDE-25) 37.5-25 MG tablet Take 1 tablet by mouth daily. 12/30/22   [name], NP  zinc gluconate 50 MG tablet Take 50 mg by mouth daily.    [provider]      Allergies    Metformin and related, Naproxen, and Oxycontin [oxycodone]    Review of Systems   Review of Systems  Physical Exam Updated Vital Signs BP (!) 157/82   Pulse (!) 58   Temp 98.6 F  (37 C)   Resp 18   Ht 5\' 7"  (1.702 m)   Wt 106.6 kg   SpO2 97%   BMI 36.81 kg/m  Physical Exam Vitals and nursing note reviewed.  HENT:     Head: Normocephalic and atraumatic.  Eyes:     Conjunctiva/sclera: Conjunctivae normal.  Pulmonary:     Effort: Pulmonary effort is normal. No respiratory distress.  Musculoskeletal:        General: Swelling, tenderness and signs of injury present. No deformity.     Cervical back: Normal range of motion.     Comments: Right knee with normal range of motion.  Patient able to fully extend and normally flex.  Patient with tenderness along the anterior portion of the knee just distal to the patella.  No sign of patellar tendon injury.  No tenderness to palpation of the medial or lateral joint lines.  Negative McMurray, anterior drawer, posterior drawer  Skin:    General: Skin is dry.  Neurological:     Mental Status: He is alert.  Psychiatric:        Speech: Speech normal.        Behavior: Behavior normal.     ED Results / Procedures / Treatments   Labs (all labs ordered are listed, but only abnormal results are displayed) Labs Reviewed - No data to display  EKG None  Radiology DG Knee Complete 4 Views Right  Result Date: 03/01/2023 CLINICAL DATA:  Knee pain.  Remote injury.  Recent hyper extension. EXAM: RIGHT KNEE - COMPLETE 4+ VIEW COMPARISON:  None Available. FINDINGS: Large knee joint effusion. Evidence of distant MCL injury with calcification. Weight-bearing compartment osteoarthritis, medial more than lateral. Evidence of mild chondromalacia of the patellofemoral joint. No acute fracture. Regional arterial calcification incidentally noted. IMPRESSION: 1. Large knee joint effusion. 2. Evidence of distant MCL injury. 3. Weight-bearing compartment osteoarthritis, medial more than lateral. Electronically Signed   By: Paulina Fusi M.D.   On: 03/01/2023 14:40    Procedures .Ortho Injury Treatment  Date/Time: 03/01/2023 3:55  PM  Performed by: Darrick Grinder, PA-C Authorized by: Darrick Grinder, PA-C   Consent:    Consent obtained:  Verbal   Consent given by:  Patient   Risks discussed:  Restricted joint movement, stiffness and nerve damage   Alternatives discussed:  No treatmentInjury location: knee Location details: right knee Injury type: soft tissue Pre-procedure neurovascular assessment: neurovascularly intact Immobilization: brace Splint Applied by: Milon Dikes Post-procedure neurovascular assessment: post-procedure neurovascularly intact       Medications Ordered in ED Medications - No data to display  ED Course/ Medical Decision Making/ A&P  Medical Decision Making Amount and/or Complexity of Data Reviewed Radiology: ordered.   This patient presents to the ED for concern of right-sided knee pain, this involves an extensive number of treatment options, and is a complaint that carries with it a high risk of complications and morbidity.  The differential diagnosis includes fracture, dislocation, soft tissue injury, others   Co morbidities that complicate the patient evaluation  History of previous meniscal injury on the right side, underlying osteoarthritis   Imaging Studies ordered:  I ordered imaging studies including plain films of the right knee I independently visualized and interpreted imaging which showed joint effusion, previous MCL injury, no fracture or dislocation I agree with the radiologist interpretation    Test / Admission - Considered:  There is no indication at this time for admission.  No fracture or dislocation noted.  Physical exam not clearly suggesting ligamentous versus meniscal injury.  Plan to discharge home with knee brace and crutches. Patient will take Tylenol  every 6 hours. Follow up with orthopedics        Final Clinical Impression(s) / ED Diagnoses Final diagnoses:  Acute pain of right knee    Rx / DC  Orders ED Discharge Orders     None         Pamala Duffel 03/01/23 1601    Gerhard Munch, MD 03/01/23 [phone]

## 2023-03-01 NOTE — ED Triage Notes (Signed)
Pt to er, pt states that 20 years ago he tore the meniscus in his R knee, states that he is here for R knee pain.  States that he got out of bed about a week ago and hyper extended his knee and since then he has been having knee pain.

## 2023-03-01 NOTE — Telephone Encounter (Signed)
Patient

## 2023-03-01 NOTE — Telephone Encounter (Signed)
Did he say why?

## 2023-03-03 ENCOUNTER — Ambulatory Visit: Payer: Medicare PPO | Admitting: Surgical

## 2023-03-06 ENCOUNTER — Telehealth: Payer: Self-pay

## 2023-03-06 ENCOUNTER — Telehealth: Payer: Self-pay | Admitting: Nurse Practitioner

## 2023-03-06 ENCOUNTER — Telehealth: Payer: Self-pay | Admitting: *Deleted

## 2023-03-06 NOTE — Telephone Encounter (Signed)
Mr. Granito decided he wanted to see a new primary care provider. Usually, I would see the person back for ED follow up and do the referral then. I don't do the referral from the ER notes.

## 2023-03-06 NOTE — Telephone Encounter (Signed)
Copied from CRM [id number]. Topic: General - Other >> Mar 06, 2023  9:09 AM Franchot Heidelberg wrote: Reason for CRM: Pt called wanting to know if Micelle Randa Evens' NCI number is: [id number] please advise if so  [phone]

## 2023-03-06 NOTE — Telephone Encounter (Signed)
Transition Care Management Follow-up Telephone Call Date of discharge and from where: 03/01/2023 Kevin Lambert  How have you been since you were released from the hospital? Much better 100 % Any questions or concerns? No  Items Reviewed: Did the pt receive and understand the discharge instructions provided? Yes  Medications obtained and verified? No  Other? No  Any new allergies since your discharge? No  Dietary orders reviewed? No Do you have support at home? No    Follow up appointments reviewed:  PCP Hospital f/u appt confirmed? No  Scheduled to see  on  @ . Specialist Hospital f/u appt confirmed? No  Scheduled to see  on  @. Are transportation arrangements needed? No  If their condition worsens, is the pt aware to call PCP or go to the Emergency Dept.? Yes Was the patient provided with contact information for the PCP's office or ED? Yes Was to pt encouraged to call back with questions or concerns? Yes

## 2023-03-06 NOTE — Telephone Encounter (Signed)
Pt calling stating that he was seen in ED and is now needing a referral to orthopedics.  He said that since he hasn't established yet with another provider that he needs to have this come from his current provider.   Also if you are able to do this referral we will need to complete a [facility] form so that they can approve a referral. Please Advise.

## 2023-03-09 NOTE — Telephone Encounter (Signed)
Pt called and a ED f/u was scheduled to see about a referral due to pt not being able to see new provider until 04/21/23. Kevin Lambert, CMA

## 2023-03-20 ENCOUNTER — Ambulatory Visit: Payer: No Typology Code available for payment source | Admitting: Podiatry

## 2023-03-22 ENCOUNTER — Ambulatory Visit (INDEPENDENT_AMBULATORY_CARE_PROVIDER_SITE_OTHER): Payer: No Typology Code available for payment source | Admitting: Podiatry

## 2023-03-22 ENCOUNTER — Encounter: Payer: Self-pay | Admitting: Podiatry

## 2023-03-22 DIAGNOSIS — M79674 Pain in right toe(s): Secondary | ICD-10-CM

## 2023-03-22 DIAGNOSIS — B351 Tinea unguium: Secondary | ICD-10-CM | POA: Diagnosis not present

## 2023-03-22 DIAGNOSIS — M79675 Pain in left toe(s): Secondary | ICD-10-CM

## 2023-03-22 DIAGNOSIS — E1165 Type 2 diabetes mellitus with hyperglycemia: Secondary | ICD-10-CM

## 2023-03-22 NOTE — Progress Notes (Signed)
  Subjective:  Patient ID: Kevin Mast., male    DOB: 1956/10/07,   MRN: [id number]  Chief Complaint  Patient presents with   routine foot care     67 y.o. male presents for concern of thickened elongated and painful nails that are difficult to trim. Requesting to have them trimmed today. Relates burning and tingling in their feet. Patient is diabetic and last A1c was  Lab Results  Component Value Date   HGBA1C 7.3 11/30/2022   .   PCP:  [name], NP    . Denies any other pedal complaints. Denies n/v/f/c.   Past Medical History:  Diagnosis Date   Arthritis    Chronic kidney disease    Depression    DM (diabetes mellitus) (HCC)    type II   Dyslipidemia    Dyspnea    unable to walk much - he thinks it is from    GERD (gastroesophageal reflux disease)    Hepatitis    Hepatitis C- treated   History of kidney stones    HTN (hypertension)    Lung cancer (HCC)    Neuropathy    nscl ca dx'd 10/2019   OSA on CPAP    PTSD (post-traumatic stress disorder)     Objective:  Physical Exam: Vascular: DP/PT pulses 2/4 bilateral. CFT <3 seconds. Absent hair growth on digits. Edema noted to bilateral lower extremities. Xerosis noted bilaterally.  Skin. No lacerations or abrasions bilateral feet. Nails 1-5 bilateral  are thickened discolored and elongated with subungual debris.  Musculoskeletal: MMT 5/5 bilateral lower extremities in DF, PF, Inversion and Eversion. Deceased ROM in DF of ankle joint.  Neurological: Sensation intact to light touch. Protective sensation diminished bilateral.    Assessment:   1. Pain due to onychomycosis of toenails of both feet   2. Type 2 diabetes mellitus with hyperglycemia, without long-term current use of insulin (HCC)       Plan:  Patient was evaluated and treated and all questions answered. -Discussed and educated patient on diabetic foot care, especially with  regards to the vascular, neurological and musculoskeletal systems.   -Stressed the importance of good glycemic control and the detriment of not  controlling glucose levels in relation to the foot. -Discussed supportive shoes at all times and checking feet regularly.  -Mechanically debrided all nails 1-5 bilateral using sterile nail nipper and filed with dremel without incident  -Answered all patient questions -Patient to return  in 3 months for at risk foot care -Patient advised to call the office if any problems or questions arise in the meantime.   Louann Sjogren, DPM

## 2023-03-30 ENCOUNTER — Encounter: Payer: Self-pay | Admitting: Nurse Practitioner

## 2023-03-30 ENCOUNTER — Encounter: Payer: Self-pay | Admitting: Internal Medicine

## 2023-03-30 ENCOUNTER — Ambulatory Visit (INDEPENDENT_AMBULATORY_CARE_PROVIDER_SITE_OTHER): Payer: Medicare PPO | Admitting: Nurse Practitioner

## 2023-03-30 VITALS — BP 162/89 | HR 69 | Ht 67.0 in | Wt 240.1 lb

## 2023-03-30 DIAGNOSIS — R197 Diarrhea, unspecified: Secondary | ICD-10-CM

## 2023-03-30 DIAGNOSIS — M25562 Pain in left knee: Secondary | ICD-10-CM | POA: Diagnosis not present

## 2023-03-30 DIAGNOSIS — M545 Low back pain, unspecified: Secondary | ICD-10-CM

## 2023-03-30 DIAGNOSIS — I152 Hypertension secondary to endocrine disorders: Secondary | ICD-10-CM

## 2023-03-30 DIAGNOSIS — R42 Dizziness and giddiness: Secondary | ICD-10-CM

## 2023-03-30 DIAGNOSIS — E1169 Type 2 diabetes mellitus with other specified complication: Secondary | ICD-10-CM | POA: Diagnosis not present

## 2023-03-30 DIAGNOSIS — Z87828 Personal history of other (healed) physical injury and trauma: Secondary | ICD-10-CM

## 2023-03-30 DIAGNOSIS — N1831 Chronic kidney disease, stage 3a: Secondary | ICD-10-CM

## 2023-03-30 DIAGNOSIS — E782 Mixed hyperlipidemia: Secondary | ICD-10-CM

## 2023-03-30 DIAGNOSIS — E1122 Type 2 diabetes mellitus with diabetic chronic kidney disease: Secondary | ICD-10-CM

## 2023-03-30 DIAGNOSIS — M25561 Pain in right knee: Secondary | ICD-10-CM | POA: Diagnosis not present

## 2023-03-30 DIAGNOSIS — E785 Hyperlipidemia, unspecified: Secondary | ICD-10-CM

## 2023-03-30 DIAGNOSIS — G8929 Other chronic pain: Secondary | ICD-10-CM | POA: Diagnosis not present

## 2023-03-30 DIAGNOSIS — I10 Essential (primary) hypertension: Secondary | ICD-10-CM

## 2023-03-30 DIAGNOSIS — N529 Male erectile dysfunction, unspecified: Secondary | ICD-10-CM

## 2023-03-30 DIAGNOSIS — E1159 Type 2 diabetes mellitus with other circulatory complications: Secondary | ICD-10-CM

## 2023-03-30 MED ORDER — EMPAGLIFLOZIN 25 MG PO TABS
25.0000 mg | ORAL_TABLET | Freq: Every day | ORAL | 1 refills | Status: DC
Start: 2023-03-30 — End: 2023-06-21

## 2023-03-30 MED ORDER — ATORVASTATIN CALCIUM 10 MG PO TABS
10.0000 mg | ORAL_TABLET | Freq: Every day | ORAL | 1 refills | Status: DC
Start: 2023-03-30 — End: 2023-06-21

## 2023-03-30 MED ORDER — SILDENAFIL CITRATE 100 MG PO TABS
ORAL_TABLET | ORAL | 5 refills | Status: DC
Start: 2023-03-30 — End: 2023-06-21

## 2023-03-30 MED ORDER — MECLIZINE HCL 25 MG PO TABS
ORAL_TABLET | ORAL | 2 refills | Status: DC
Start: 2023-03-30 — End: 2023-12-11

## 2023-03-30 MED ORDER — OMEPRAZOLE 20 MG PO CPDR
20.0000 mg | DELAYED_RELEASE_CAPSULE | Freq: Every day | ORAL | 1 refills | Status: DC
Start: 2023-03-30 — End: 2023-06-21

## 2023-03-30 MED ORDER — SEMAGLUTIDE(0.25 OR 0.5MG/DOS) 2 MG/3ML ~~LOC~~ SOPN
0.2500 mg | PEN_INJECTOR | SUBCUTANEOUS | 2 refills | Status: DC
Start: 2023-03-30 — End: 2023-06-21

## 2023-03-30 MED ORDER — CARVEDILOL 3.125 MG PO TABS
3.1250 mg | ORAL_TABLET | Freq: Two times a day (BID) | ORAL | 1 refills | Status: DC
Start: 2023-03-30 — End: 2023-06-21

## 2023-03-30 MED ORDER — CHLORTHALIDONE 25 MG PO TABS
25.0000 mg | ORAL_TABLET | Freq: Every day | ORAL | 1 refills | Status: DC
Start: 2023-03-30 — End: 2023-06-21

## 2023-03-30 MED ORDER — AMLODIPINE BESYLATE 10 MG PO TABS
10.0000 mg | ORAL_TABLET | Freq: Every day | ORAL | 1 refills | Status: DC
Start: 2023-03-30 — End: 2023-06-21

## 2023-03-30 MED ORDER — CYCLOBENZAPRINE HCL 10 MG PO TABS
10.0000 mg | ORAL_TABLET | Freq: Every evening | ORAL | 2 refills | Status: DC | PRN
Start: 2023-03-30 — End: 2023-06-21

## 2023-03-30 NOTE — Progress Notes (Signed)
Established patient visit   Patient: Kevin Lambert.   DOB: 1955/12/29   67 y.o. Male  MRN: [id number] Visit Date: 03/30/2023   Chief Complaint  Patient presents with   Hospitalization Follow-up   Subjective    HPI  Follow up -ED visit  -right knee pain  -hyperextended his right knee on 02/28/2023.  --has history of right meniscal tear.  -x-ray done at ED 03/01/2023 showed the following:  IMPRESSION: 1. Large knee joint effusion. 2. Evidence of distant MCL injury. 3. Weight-bearing compartment osteoarthritis, medial more than lateral.  -knee brace was applied.  -he was given crutches  -advised to follow up with orthopedics  -needs to have referral to orthopedics in order to be seen   Medications: Outpatient Medications Prior to Visit  Medication Sig   Ascorbic Acid (VITAMIN C) 1000 MG tablet Take 1,000 mg by mouth daily.   aspirin EC 81 MG tablet Take 81 mg by mouth daily.   blood glucose meter kit and supplies Dispense based on patient and insurance preference. Patient to check blood sugars daily, prior to eating and also as needed (FOR ICD-10 E10.9, E11.9).   cholecalciferol (VITAMIN D3) 25 MCG (1000 UNIT) tablet Take 1,000 Units by mouth daily.   Cyanocobalamin (VITAMIN B 12 PO) Take 1 tablet by mouth daily.   ketoconazole (NIZORAL) 2 % cream Apply 1 Application topically daily.   magic mouthwash SOLN Take 5 mLs by mouth 4 (four) times daily as needed for mouth pain. Swish and swallow or spit   nicotine (NICODERM CQ - DOSED IN MG/24 HOURS) 21 mg/24hr patch Place 1 patch (21 mg total) onto the skin daily.   prochlorperazine (COMPAZINE) 10 MG tablet Take 1 tablet (10 mg total) by mouth every 6 (six) hours as needed.   Propylene Glycol (SYSTANE BALANCE OP) Place 1 drop into both eyes daily.   triamcinolone cream (KENALOG) 0.5 % Apply 1 Application topically 2 (two) times daily.   zinc gluconate 50 MG tablet Take 50 mg by mouth daily.   [DISCONTINUED] amLODipine (NORVASC) 10 MG  tablet Take 1 tablet (10 mg total) by mouth daily.   [DISCONTINUED] atorvastatin (LIPITOR) 10 MG tablet Take 1 tablet (10 mg total) by mouth daily.   [DISCONTINUED] carvedilol (COREG) 3.125 MG tablet Take 1 tablet (3.125 mg total) by mouth 2 (two) times daily with a meal.   [DISCONTINUED] cyclobenzaprine (FLEXERIL) 10 MG tablet Take 1 tablet (10 mg total) by mouth at bedtime as needed for muscle spasms.   [DISCONTINUED] empagliflozin (JARDIANCE) 25 MG TABS tablet Take 1 tablet (25 mg total) by mouth daily before breakfast.   [DISCONTINUED] meclizine (ANTIVERT) 25 MG tablet TAKE ONE-HALF TABLET BY MOUTH THREE TIMES A DAY AS NEEDED FOR DIZZINESS   [DISCONTINUED] omeprazole (PRILOSEC) 20 MG capsule Take 1 capsule (20 mg total) by mouth daily.   [DISCONTINUED] Semaglutide,0.25 or 0.5MG /DOS, 2 MG/3ML SOPN Inject 0.25 mg into the skin once a week.   [DISCONTINUED] sildenafil (VIAGRA) 100 MG tablet TAKE ONE TABLET BY MOUTH AS INSTRUCTED (TAKE 1 HOUR PRIOR TO SEXUAL ACTIVITY *DO NOT EXCEED 1 DOSE PER 24 HOUR PERIOD*)   [DISCONTINUED] triamterene-hydrochlorothiazide (MAXZIDE-25) 37.5-25 MG tablet Take 1 tablet by mouth daily.   No facility-administered medications prior to visit.    Review of Systems Today's Vitals   03/30/23 1312 03/30/23 1401  BP: (Abnormal) 175/91 (Abnormal) 162/89  Pulse: 69   SpO2: 97%   Weight: 240 lb 1.9 oz (108.9 kg)   Height: 5\' 7"  (1.702 m)  Body mass index is 37.61 kg/m.     Objective     Today's Vitals   03/30/23 1312 03/30/23 1401  BP: (Abnormal) 175/91 (Abnormal) 162/89  Pulse: 69   SpO2: 97%   Weight: 240 lb 1.9 oz (108.9 kg)   Height: 5\' 7"  (1.702 m)    Body mass index is 37.61 kg/m.  BP Readings from Last 3 Encounters:  03/30/23 (Abnormal) 162/89  03/01/23 (Abnormal) 157/82  12/21/22 (Abnormal) 135/56    Wt Readings from Last 3 Encounters:  03/30/23 240 lb 1.9 oz (108.9 kg)  03/01/23 235 lb (106.6 kg)  12/21/22 240 lb 8 oz (109.1 kg)     Physical Exam Vitals and nursing note reviewed.  Constitutional:      Appearance: Normal appearance. He is well-developed.  HENT:     Head: Normocephalic and atraumatic.     Nose: Nose normal.     Mouth/Throat:     Mouth: Mucous membranes are moist.     Pharynx: Oropharynx is clear.  Eyes:     Extraocular Movements: Extraocular movements intact.     Conjunctiva/sclera: Conjunctivae normal.     Pupils: Pupils are equal, round, and reactive to light.  Neck:     Vascular: No carotid bruit.  Cardiovascular:     Rate and Rhythm: Normal rate and regular rhythm.     Pulses: Normal pulses.     Heart sounds: Normal heart sounds.  Pulmonary:     Effort: Pulmonary effort is normal.     Breath sounds: Normal breath sounds.  Abdominal:     Palpations: Abdomen is soft.  Musculoskeletal:     Cervical back: Normal range of motion and neck supple.     Right knee: Swelling, bony tenderness and crepitus present. Decreased range of motion. Tenderness present.     Left knee: Swelling, bony tenderness and crepitus present. Decreased range of motion. Tenderness present.  Lymphadenopathy:     Cervical: No cervical adenopathy.  Skin:    General: Skin is warm and dry.     Capillary Refill: Capillary refill takes less than 2 seconds.  Neurological:     General: No focal deficit present.     Mental Status: He is alert and oriented to person, place, and time.  Psychiatric:        Mood and Affect: Mood normal.        Behavior: Behavior normal.        Thought Content: Thought content normal.        Judgment: Judgment normal.      Assessment & Plan    Chronic pain of both knees Assessment & Plan: History of injury to bilateral meniscus.  Currently pain is worse on the right side than left. -x-ray done at ED 03/01/2023 showed the following:  IMPRESSION: 1. Large knee joint effusion. 2. Evidence of distant MCL injury. 3. Weight-bearing compartment osteoarthritis, medial more than lateral.  -Refer  to orthopedic surgery for further evaluation and treatment.  Orders: -     Ambulatory referral to Orthopedic Surgery  H/O tear of meniscus of knee joint Assessment & Plan: Bilateral. Patient having acute knee pain, worse on right side than left.  Refer to orthopedics for further evaluation and treatment.  Orders: -     Ambulatory referral to Orthopedic Surgery  Acute midline low back pain without sciatica Assessment & Plan: May take Flexeril as needed and as prescribed.  He understands he should not work or drive after taking this medication as it may  cause dizziness and or drowsiness.  Written prescription given to patient today.  Orders: -     Cyclobenzaprine HCl; Take 1 tablet (10 mg total) by mouth at bedtime as needed for muscle spasms.  Dispense: 30 tablet; Refill: 2  Type 2 diabetes mellitus with stage 3a chronic kidney disease, without long-term current use of insulin (HCC) -     Empagliflozin; Take 1 tablet (25 mg total) by mouth daily before breakfast.  Dispense: 90 tablet; Refill: 1 -     Semaglutide(0.25 or 0.5MG /DOS); Inject 0.25 mg into the skin once a week.  Dispense: 3 mL; Refill: 2  Hypertension associated with type 2 diabetes mellitus (HCC) Assessment & Plan: - Blood pressure elevated during today's visit. --Has been without blood pressure medication for some time. --Review amlodipine 10 mg with written prescription provided. --Add chlorthalidone 25 mg daily. --Recommended he follow a heart healthy diet, limiting salt, and drinking plenty of water.  Orders: -     amLODIPine Besylate; Take 1 tablet (10 mg total) by mouth daily.  Dispense: 90 tablet; Refill: 1 -     Carvedilol; Take 1 tablet (3.125 mg total) by mouth 2 (two) times daily with a meal.  Dispense: 180 tablet; Refill: 1 -     Chlorthalidone; Take 1 tablet (25 mg total) by mouth daily.  Dispense: 90 tablet; Refill: 1  Hyperlipidemia associated with type 2 diabetes mellitus (HCC) Assessment &  Plan: Continue atorvastatin as prescribed.  Written prescription given to patient.  Orders: -     Atorvastatin Calcium; Take 1 tablet (10 mg total) by mouth daily.  Dispense: 90 tablet; Refill: 1  Vertigo -     Meclizine HCl; TAKE ONE-HALF TABLET BY MOUTH THREE TIMES A DAY AS NEEDED FOR DIZZINESS  Dispense: 30 tablet; Refill: 2  Diarrhea, unspecified type Assessment & Plan: Patient taking omeprazole.  May continue as previously prescribed.  Written prescription given to patient during today's visit.  Orders: -     Omeprazole; Take 1 capsule (20 mg total) by mouth daily.  Dispense: 90 capsule; Refill: 1  Erectile dysfunction, unspecified erectile dysfunction type Assessment & Plan: May take sildenafil as needed and as prescribed.  Written prescription given to patient today.  Orders: -     Sildenafil Citrate; TAKE ONE TABLET BY MOUTH AS INSTRUCTED (TAKE 1 HOUR PRIOR TO SEXUAL ACTIVITY *DO NOT EXCEED 1 DOSE PER 24 HOUR PERIOD*)  Dispense: 10 tablet; Refill: 5     Return for prn worsening or persistent symptoms.         [name], NP  [facility] at [facility] [phone] (phone) [phone] (fax)  [facility]

## 2023-04-04 ENCOUNTER — Telehealth: Payer: Self-pay

## 2023-04-04 NOTE — Telephone Encounter (Signed)
Pt was calling to say thank you and to let you know he was able to get his Rx fill at the [facility] pharmacy.

## 2023-04-04 NOTE — Telephone Encounter (Signed)
Wonderful. Thank you for letting me know.

## 2023-04-10 DIAGNOSIS — Z87828 Personal history of other (healed) physical injury and trauma: Secondary | ICD-10-CM | POA: Insufficient documentation

## 2023-04-10 DIAGNOSIS — G8929 Other chronic pain: Secondary | ICD-10-CM | POA: Insufficient documentation

## 2023-04-10 NOTE — Assessment & Plan Note (Signed)
Continue atorvastatin as prescribed.  Written prescription given to patient.

## 2023-04-10 NOTE — Assessment & Plan Note (Signed)
History of injury to bilateral meniscus.  Currently pain is worse on the right side than left. -x-ray done at ED 03/01/2023 showed the following:  IMPRESSION: 1. Large knee joint effusion. 2. Evidence of distant MCL injury. 3. Weight-bearing compartment osteoarthritis, medial more than lateral.  -Refer to orthopedic surgery for further evaluation and treatment.

## 2023-04-10 NOTE — Assessment & Plan Note (Signed)
May take Flexeril as needed and as prescribed.  He understands he should not work or drive after taking this medication as it may cause dizziness and or drowsiness.  Written prescription given to patient today.

## 2023-04-10 NOTE — Assessment & Plan Note (Signed)
Patient taking omeprazole.  May continue as previously prescribed.  Written prescription given to patient during today's visit.

## 2023-04-10 NOTE — Assessment & Plan Note (Signed)
Bilateral. Patient having acute knee pain, worse on right side than left.  Refer to orthopedics for further evaluation and treatment.

## 2023-04-10 NOTE — Assessment & Plan Note (Signed)
-   Blood pressure elevated during today's visit. --Has been without blood pressure medication for some time. --Review amlodipine 10 mg with written prescription provided. --Add chlorthalidone 25 mg daily. --Recommended he follow a heart healthy diet, limiting salt, and drinking plenty of water.

## 2023-04-10 NOTE — Assessment & Plan Note (Deleted)
Intermittent.  May take meclizine as needed and as prescribed.  Written prescription was provided to patient.

## 2023-04-10 NOTE — Assessment & Plan Note (Signed)
Trial of Ozempic 0.25 mg weekly for 3 weeks then increase to 0.5 mg weekly if lower dose tolerated well.  Written prescription provided for the patient. Recommend limiting intake of carbohydrates, sugar, and starchy foods. Hemoglobin A1c due at next visit.

## 2023-04-10 NOTE — Assessment & Plan Note (Signed)
May take sildenafil as needed and as prescribed.  Written prescription given to patient today.

## 2023-04-14 ENCOUNTER — Encounter: Payer: Self-pay | Admitting: Internal Medicine

## 2023-04-17 ENCOUNTER — Inpatient Hospital Stay: Payer: No Typology Code available for payment source

## 2023-04-17 ENCOUNTER — Other Ambulatory Visit: Payer: Self-pay

## 2023-04-17 ENCOUNTER — Ambulatory Visit (HOSPITAL_COMMUNITY)
Admission: RE | Admit: 2023-04-17 | Discharge: 2023-04-17 | Disposition: A | Discharge status: Home / Self Care | Payer: No Typology Code available for payment source | Source: Ambulatory Visit | Attending: Internal Medicine | Admitting: Internal Medicine

## 2023-04-17 ENCOUNTER — Inpatient Hospital Stay: Payer: No Typology Code available for payment source | Attending: Internal Medicine

## 2023-04-17 DIAGNOSIS — C3490 Malignant neoplasm of unspecified part of unspecified bronchus or lung: Secondary | ICD-10-CM | POA: Diagnosis present

## 2023-04-17 DIAGNOSIS — C3411 Malignant neoplasm of upper lobe, right bronchus or lung: Secondary | ICD-10-CM | POA: Diagnosis present

## 2023-04-17 DIAGNOSIS — C3491 Malignant neoplasm of unspecified part of right bronchus or lung: Secondary | ICD-10-CM

## 2023-04-17 DIAGNOSIS — Z95828 Presence of other vascular implants and grafts: Secondary | ICD-10-CM

## 2023-04-17 LAB — CBC WITH DIFFERENTIAL (CANCER CENTER ONLY)
Abs Immature Granulocytes: 0.02 10*3/uL (ref 0.00–0.07)
Basophils Absolute: 0 10*3/uL (ref 0.0–0.1)
Basophils Relative: 1 %
Eosinophils Absolute: 0.4 10*3/uL (ref 0.0–0.5)
Eosinophils Relative: 7 %
HCT: 40.7 % (ref 39.0–52.0)
Hemoglobin: 14.2 g/dL (ref 13.0–17.0)
Immature Granulocytes: 0 %
Lymphocytes Relative: 34 %
Lymphs Abs: 2 10*3/uL (ref 0.7–4.0)
MCH: 32.5 pg (ref 26.0–34.0)
MCHC: 34.9 g/dL (ref 30.0–36.0)
MCV: 93.1 fL (ref 80.0–100.0)
Monocytes Absolute: 0.7 10*3/uL (ref 0.1–1.0)
Monocytes Relative: 12 %
Neutro Abs: 2.8 10*3/uL (ref 1.7–7.7)
Neutrophils Relative %: 46 %
Platelet Count: 156 10*3/uL (ref 150–400)
RBC: 4.37 MIL/uL (ref 4.22–5.81)
RDW: 14 % (ref 11.5–15.5)
WBC Count: 6.1 10*3/uL (ref 4.0–10.5)
nRBC: 0 % (ref 0.0–0.2)

## 2023-04-17 LAB — CMP (CANCER CENTER ONLY)
ALT: 28 U/L (ref 0–44)
AST: 19 U/L (ref 15–41)
Albumin: 4 g/dL (ref 3.5–5.0)
Alkaline Phosphatase: 50 U/L (ref 38–126)
Anion gap: 5 (ref 5–15)
BUN: 26 mg/dL — ABNORMAL HIGH (ref 8–23)
CO2: 28 mmol/L (ref 22–32)
Calcium: 9.8 mg/dL (ref 8.9–10.3)
Chloride: 104 mmol/L (ref 98–111)
Creatinine: 1.96 mg/dL — ABNORMAL HIGH (ref 0.61–1.24)
GFR, Estimated: 37 mL/min — ABNORMAL LOW (ref >60)
Glucose, Bld: 138 mg/dL — ABNORMAL HIGH (ref 70–99)
Potassium: 4.1 mmol/L (ref 3.5–5.1)
Sodium: 137 mmol/L (ref 135–145)
Total Bilirubin: 0.3 mg/dL (ref 0.3–1.2)
Total Protein: 6.6 g/dL (ref 6.5–8.1)

## 2023-04-17 MED ORDER — SODIUM CHLORIDE 0.9% FLUSH
10.0000 mL | Freq: Once | INTRAVENOUS | Status: AC
  Administered 2023-04-17: 10 mL

## 2023-04-19 ENCOUNTER — Inpatient Hospital Stay (HOSPITAL_BASED_OUTPATIENT_CLINIC_OR_DEPARTMENT_OTHER): Payer: No Typology Code available for payment source | Admitting: Internal Medicine

## 2023-04-19 ENCOUNTER — Ambulatory Visit: Payer: No Typology Code available for payment source | Admitting: Internal Medicine

## 2023-04-19 ENCOUNTER — Inpatient Hospital Stay: Payer: No Typology Code available for payment source

## 2023-04-19 VITALS — BP 139/65 | HR 70 | Temp 97.9°F | Resp 18 | Wt 236.6 lb

## 2023-04-19 DIAGNOSIS — C3411 Malignant neoplasm of upper lobe, right bronchus or lung: Secondary | ICD-10-CM | POA: Diagnosis not present

## 2023-04-19 DIAGNOSIS — C3490 Malignant neoplasm of unspecified part of unspecified bronchus or lung: Secondary | ICD-10-CM | POA: Diagnosis not present

## 2023-04-19 NOTE — Progress Notes (Signed)
[facility] [phone]   [phone]) [phone]  OFFICE PROGRESS NOTE  [name], NP [address] [place] [address]  DIAGNOSIS: Stage IV (T2b, N3, M1a) non-small cell lung cancer, adenocarcinoma presented with large right upper lobe lung mass in addition to mediastinal and right supraclavicular lymphadenopathy as well as bilateral pulmonary nodules diagnosed in February 2021.  MOLECULAR STUDY by [employer]:  KRASG12C, 4.3%, Binimetinib  [id number], 0.9%, Niraparib, Olaparib, Rucaparib,Talazoparib, Tazemetostat  [id number], 1.7%, None   PRIOR THERAPY: Systemic chemotherapy with carboplatin for AUC of 5, Alimta 500 mg/M2 and Keytruda 200 mg IV every 3 weeks.  First dose December 31, 2019.  Status post 31 cycles.  The last few cycles he has been treated with single agent Keytruda secondary to renal insufficiency.  This treatment was discontinued secondary to immunotherapy mediated colitis and diarrhea  CURRENT THERAPY: Observation.  INTERVAL HISTORY: Kevin Lambert. 67 y.o. male returns to the clinic today for follow-up visit.  The patient is feeling fine today with no concerning complaints.  He denied having any current chest pain, shortness of breath, cough or hemoptysis.  He has no nausea, vomiting, diarrhea or constipation.  He has no headache or visual changes.  He has no recent weight loss or night sweats.  He is here today for evaluation with repeat CT scan of the chest, abdomen and pelvis for restaging of his disease.  MEDICAL HISTORY: Past Medical History:  Diagnosis Date   Arthritis    Chronic kidney disease    Depression    DM (diabetes mellitus) (HCC)    type II   Dyslipidemia    Dyspnea    unable to walk much - he thinks it is from    GERD (gastroesophageal reflux disease)    Hepatitis    Hepatitis C- treated   History of kidney stones    HTN (hypertension)    Lung cancer (HCC)    Neuropathy    nscl ca dx'd  10/2019   OSA on CPAP    PTSD (post-traumatic stress disorder)     ALLERGIES:  is allergic to metformin and related, naproxen, and oxycontin [oxycodone].  MEDICATIONS:  Current Outpatient Medications  Medication Sig Dispense Refill   amLODipine (NORVASC) 10 MG tablet Take 1 tablet (10 mg total) by mouth daily. 90 tablet 1   Ascorbic Acid (VITAMIN C) 1000 MG tablet Take 1,000 mg by mouth daily.     aspirin EC 81 MG tablet Take 81 mg by mouth daily.     atorvastatin (LIPITOR) 10 MG tablet Take 1 tablet (10 mg total) by mouth daily. 90 tablet 1   blood glucose meter kit and supplies Dispense based on patient and insurance preference. Patient to check blood sugars daily, prior to eating and also as needed (FOR ICD-10 E10.9, E11.9). 1 each 0   carvedilol (COREG) 3.125 MG tablet Take 1 tablet (3.125 mg total) by mouth 2 (two) times daily with a meal. 180 tablet 1   chlorthalidone (HYGROTON) 25 MG tablet Take 1 tablet (25 mg total) by mouth daily. 90 tablet 1   cholecalciferol (VITAMIN D3) 25 MCG (1000 UNIT) tablet Take 1,000 Units by mouth daily.     Cyanocobalamin (VITAMIN B 12 PO) Take 1 tablet by mouth daily.     cyclobenzaprine (FLEXERIL) 10 MG tablet Take 1 tablet (10 mg total) by mouth at bedtime as needed for muscle spasms. 30 tablet 2   empagliflozin (JARDIANCE) 25  MG TABS tablet Take 1 tablet (25 mg total) by mouth daily before breakfast. 90 tablet 1   ketoconazole (NIZORAL) 2 % cream Apply 1 Application topically daily. 60 g 2   magic mouthwash SOLN Take 5 mLs by mouth 4 (four) times daily as needed for mouth pain. Swish and swallow or spit 240 mL 2   meclizine (ANTIVERT) 25 MG tablet TAKE ONE-HALF TABLET BY MOUTH THREE TIMES A DAY AS NEEDED FOR DIZZINESS 30 tablet 2   nicotine (NICODERM CQ - DOSED IN MG/24 HOURS) 21 mg/24hr patch Place 1 patch (21 mg total) onto the skin daily. 30 patch 2   omeprazole (PRILOSEC) 20 MG capsule Take 1 capsule (20 mg total) by mouth daily. 90 capsule 1    prochlorperazine (COMPAZINE) 10 MG tablet Take 1 tablet (10 mg total) by mouth every 6 (six) hours as needed. 30 tablet 2   Propylene Glycol (SYSTANE BALANCE OP) Place 1 drop into both eyes daily.     Semaglutide,0.25 or 0.5MG /DOS, 2 MG/3ML SOPN Inject 0.25 mg into the skin once a week. 3 mL 2   sildenafil (VIAGRA) 100 MG tablet TAKE ONE TABLET BY MOUTH AS INSTRUCTED (TAKE 1 HOUR PRIOR TO SEXUAL ACTIVITY *DO NOT EXCEED 1 DOSE PER 24 HOUR PERIOD*) 10 tablet 5   triamcinolone cream (KENALOG) 0.5 % Apply 1 Application topically 2 (two) times daily. 454 g 1   zinc gluconate 50 MG tablet Take 50 mg by mouth daily.     No current facility-administered medications for this visit.    SURGICAL HISTORY:  Past Surgical History:  Procedure Laterality Date   BIOPSY OF MEDIASTINAL MASS  12/10/2019   Procedure: BIOPSY OF MEDIASTINAL MASS;  Surgeon: Josephine Igo, DO;  Location: [facility];  Service: Pulmonary;;  right upper lobe   BRONCHIAL NEEDLE ASPIRATION BIOPSY  12/10/2019   Procedure: BRONCHIAL NEEDLE ASPIRATION BIOPSIES;  Surgeon: Josephine Igo, DO;  Location: [facility];  Service: Pulmonary;;   COLONOSCOPY     IR IMAGING GUIDED PORT INSERTION  12/31/2019   kidney stone     URETERAL STENT PLACEMENT     Ureteral Stent Removed     VIDEO BRONCHOSCOPY WITH ENDOBRONCHIAL ULTRASOUND N/A 12/10/2019   Procedure: VIDEO BRONCHOSCOPY WITH ENDOBRONCHIAL ULTRASOUND;  Surgeon: Josephine Igo, DO;  Location: [facility];  Service: Pulmonary;  Laterality: N/A;    REVIEW OF SYSTEMS:  A comprehensive review of systems was negative.   PHYSICAL EXAMINATION: General appearance: alert, cooperative, and no distress Head: Normocephalic, without obvious abnormality, atraumatic Neck: no adenopathy, no JVD, supple, symmetrical, trachea midline, and thyroid not enlarged, symmetric, no tenderness/mass/nodules Lymph nodes: Cervical, supraclavicular, and axillary nodes normal. Resp: clear to auscultation  bilaterally Back: symmetric, no curvature. ROM normal. No CVA tenderness. Cardio: regular rate and rhythm, S1, S2 normal, no murmur, click, rub or gallop GI: soft, non-tender; bowel sounds normal; no masses,  no organomegaly Extremities: extremities normal, atraumatic, no cyanosis or edema  ECOG PERFORMANCE STATUS: 0 - Asymptomatic  Blood pressure 139/65, pulse 70, temperature 97.9 F (36.6 C), temperature source Oral, resp. rate 18, weight 236 lb 9.6 oz (107.3 kg), SpO2 97 %.  LABORATORY DATA: Lab Results  Component Value Date   WBC 6.1 04/17/2023   HGB 14.2 04/17/2023   HCT 40.7 04/17/2023   MCV 93.1 04/17/2023   PLT 156 04/17/2023      Chemistry      Component Value Date/Time   NA 137 04/17/2023 1443   NA 137 07/12/2022 1115  K 4.1 04/17/2023 1443   CL 104 04/17/2023 1443   CO2 28 04/17/2023 1443   BUN 26 (H) 04/17/2023 1443   BUN 28 (H) 07/12/2022 1115   CREATININE 1.96 (H) 04/17/2023 1443      Component Value Date/Time   CALCIUM 9.8 04/17/2023 1443   ALKPHOS 50 04/17/2023 1443   AST 19 04/17/2023 1443   ALT 28 04/17/2023 1443   BILITOT 0.3 04/17/2023 1443       RADIOGRAPHIC STUDIES: No results found.   ASSESSMENT AND PLAN: This is a very pleasant 67 years old African-American male recently diagnosed with a stage IV non-small cell lung cancer, adenocarcinoma with positive KRAS G12C mutation presented with large right lower lobe lung mass in addition to mediastinal and right supraclavicular lymphadenopathy as well as bilateral pulmonary nodules diagnosed in February 2021. I explained to the patient that he has incurable condition and all the treatment options will be of palliative nature. The patient has no actionable mutations on the recent molecular studies and he is not a candidate for the [facility] clinical trial. He started systemic chemotherapy with carboplatin, Alimta and Keytruda status post 31 cycles.  Starting from cycle #5 the patient is on maintenance  treatment with Alimta and Keytruda every 3 weeks.  He has been recently on single agent Keytruda for several cycles secondary to renal insufficiency. His treatment was discontinued secondary to immunotherapy mediated colitis with diarrhea. The patient is currently on observation and he has been doing fine with no concerning complaints. He had repeat CT scan of the chest, abdomen and pelvis performed few days ago but unfortunately the final report is still pending. I personally and independently reviewed the scan images in comparison to the previous imaging 4 months ago and I do not see any clear evidence for disease progression. I recommended for the patient to continue on observation with repeat CT scan of the chest, abdomen and pelvis in 6 months. The patient will continue to have Port-A-Cath flush every 2 months. He is also followed by nephrology for his renal insufficiency. He was advised to call immediately if he has any other concerning symptoms in the interval. The patient voices understanding of current disease status and treatment options and is in agreement with the current care plan.  All questions were answered. The patient knows to call the clinic with any problems, questions or concerns. We can certainly see the patient much sooner if necessary.  Disclaimer: This note was dictated with voice recognition software. Similar sounding words can inadvertently be transcribed and may not be corrected upon review.

## 2023-04-20 ENCOUNTER — Telehealth (INDEPENDENT_AMBULATORY_CARE_PROVIDER_SITE_OTHER): Payer: Self-pay | Admitting: Primary Care

## 2023-04-20 NOTE — Telephone Encounter (Signed)
Pt confirmed he will be at [address]

## 2023-04-21 ENCOUNTER — Ambulatory Visit (INDEPENDENT_AMBULATORY_CARE_PROVIDER_SITE_OTHER): Payer: No Typology Code available for payment source | Admitting: Primary Care

## 2023-04-21 ENCOUNTER — Encounter (INDEPENDENT_AMBULATORY_CARE_PROVIDER_SITE_OTHER): Payer: Self-pay | Admitting: Primary Care

## 2023-04-21 VITALS — BP 128/84 | HR 80 | Resp 16 | Ht 67.0 in | Wt 233.2 lb

## 2023-04-21 DIAGNOSIS — G8929 Other chronic pain: Secondary | ICD-10-CM

## 2023-04-21 DIAGNOSIS — E1122 Type 2 diabetes mellitus with diabetic chronic kidney disease: Secondary | ICD-10-CM | POA: Diagnosis not present

## 2023-04-21 DIAGNOSIS — M545 Low back pain, unspecified: Secondary | ICD-10-CM

## 2023-04-21 DIAGNOSIS — E782 Mixed hyperlipidemia: Secondary | ICD-10-CM

## 2023-04-21 DIAGNOSIS — F321 Major depressive disorder, single episode, moderate: Secondary | ICD-10-CM

## 2023-04-21 DIAGNOSIS — Z6837 Body mass index (BMI) 37.0-37.9, adult: Secondary | ICD-10-CM | POA: Diagnosis not present

## 2023-04-21 DIAGNOSIS — N1831 Chronic kidney disease, stage 3a: Secondary | ICD-10-CM

## 2023-04-21 LAB — POCT GLYCOSYLATED HEMOGLOBIN (HGB A1C): HbA1c, POC (controlled diabetic range): 6.5 % (ref 0.0–7.0)

## 2023-04-21 NOTE — Patient Instructions (Signed)
Managing Depression, Adult Depression is a mental health condition that affects your thoughts, feelings, and actions. Being diagnosed with depression can bring you relief if you did not know why you have felt or behaved a certain way. It could also leave you feeling overwhelmed. Finding ways to manage your symptoms can help you feel more positive about your future. How to manage lifestyle changes Being depressed is difficult. Depression can increase the level of everyday stress. Stress can make depression symptoms worse. You may believe your symptoms cannot be managed or will never improve. However, there are many things you can try to help manage your symptoms. There is hope. Managing stress  Stress is your body's reaction to life changes and events, both good and bad. Stress can add to your feelings of depression. Learning to manage your stress can help lessen your feelings of depression. Try some of the following approaches to reducing your stress (stress reduction techniques): Listen to music that you enjoy and that inspires you. Try using a meditation app or take a meditation class. Develop a practice that helps you connect with your spiritual self. Walk in nature, pray, or go to a place of worship. Practice deep breathing. To do this, inhale slowly through your nose. Pause at the top of your inhale for a few seconds and then exhale slowly, letting yourself relax. Repeat this three or four times. Practice yoga to help relax and work your muscles. Choose a stress reduction technique that works for you. These techniques take time and practice to develop. Set aside 5-15 minutes a day to do them. Therapists can offer training in these techniques. Do these things to help manage stress: Keep a journal. Know your limits. Set healthy boundaries for yourself and others, such as saying "no" when you think something is too much. Pay attention to how you react to certain situations. You may not be able to  control everything, but you can change your reaction. Add humor to your life by watching funny movies or shows. Make time for activities that you enjoy and that relax you. Spend less time using electronics, especially at night before bed. The light from screens can make your brain think it is time to get up rather than go to bed.  Medicines Medicines, such as antidepressants, are often a part of treatment for depression. Talk with your pharmacist or health care provider about all the medicines, supplements, and herbal products that you take, their possible side effects, and what medicines and other products are safe to take together. Make sure to report any side effects you may have to your health care provider. Relationships Your health care provider may suggest family therapy, couples therapy, or individual therapy as part of your treatment. How to recognize changes Everyone responds differently to treatment for depression. As you recover from depression, you may start to: Have more interest in doing activities. Feel more hopeful. Have more energy. Eat a more regular amount of food. Have better mental focus. It is important to recognize if your depression is not getting better or is getting worse. The symptoms you had in the beginning may return, such as: Feeling tired. Eating too much or too little. Sleeping too much or too little. Feeling restless, agitated, or hopeless. Trouble focusing or making decisions. Having unexplained aches and pains. Feeling irritable, angry, or aggressive. If you or your family members notice these symptoms coming back, let your health care provider know right away. Follow these instructions at home: Activity Try to   get some form of exercise each day, such as walking. Try yoga, mindfulness, or other stress reduction techniques. Participate in group activities if you are able. Lifestyle Get enough sleep. Cut down on or stop using caffeine, tobacco,  alcohol, and any other harmful substances. Eat a healthy diet that includes plenty of vegetables, fruits, whole grains, low-fat dairy products, and lean protein. Limit foods that are high in solid fats, added sugar, or salt (sodium). General instructions Take over-the-counter and prescription medicines only as told by your health care provider. Keep all follow-up visits. It is important for your health care provider to check on your mood, behavior, and medicines. Your health care provider may need to make changes to your treatment. Where to find support Talking to others  Friends and family members can be sources of support and guidance. Talk to trusted friends or family members about your condition. Explain your symptoms and let them know that you are working with a health care provider to treat your depression. Tell friends and family how they can help. Finances Find mental health providers that fit with your financial situation. Talk with your health care provider if you are worried about access to food, housing, or medicine. Call your insurance company to learn about your co-pays and prescription plan. Where to find more information You can find support in your area from: Anxiety and Depression Association of America (ADAA): adaa.org Mental Health America: mentalhealthamerica.net National Alliance on Mental Illness: nami.org Contact a health care provider if: You stop taking your antidepressant medicines, and you have any of these symptoms: Nausea. Headache. Light-headedness. Chills and body aches. Not being able to sleep (insomnia). You or your friends and family think your depression is getting worse. Get help right away if: You have thoughts of hurting yourself or others. Get help right away if you feel like you may hurt yourself or others, or have thoughts about taking your own life. Go to your nearest emergency room or: Call 911. Call the National Suicide Prevention Lifeline at  1-800-273-8255 or 988. This is open 24 hours a day. Text the Crisis Text Line at 741741. This information is not intended to replace advice given to you by your health care provider. Make sure you discuss any questions you have with your health care provider. Document Revised: 03/01/2022 Document Reviewed: 03/01/2022 Elsevier Patient Education  2024 Elsevier Inc.  

## 2023-04-21 NOTE — Progress Notes (Unsigned)
New Patient Office Visit  Subjective    Patient ID: Kevin Lambert., male    DOB: [birth year]  Age: 67 y.o. MRN: [id number]  CC:  Chief Complaint  Patient presents with  . New Patient (Initial Visit)  . Diabetes  . Hypertension  . Back Pain    lower  . Leg Pain    B/l    HPI Mr. Kevin Lambert. Is a 67 year old obese male presents to establish care. He is followed by oncology for lung ca and will continue to keep appt.  He voices concerns about low back pain -10/10 years 1/2 his life-no trauma's always had physical jobs.  (L2-S1) with radiating dow right > left. Leg pain bilateral with walking short distance with short of breath. Patient has No headache, No chest pain, No abdominal pain - No Nausea, No new weakness tingling or numbness ( dx of neuropathy) , No Cough - shortness of breath- with exertion   Outpatient Encounter Medications as of 04/21/2023  Medication Sig  . amLODipine (NORVASC) 10 MG tablet Take 1 tablet (10 mg total) by mouth daily.  . Ascorbic Acid (VITAMIN C) 1000 MG tablet Take 1,000 mg by mouth daily.  Marland Kitchen aspirin EC 81 MG tablet Take 81 mg by mouth daily.  Marland Kitchen atorvastatin (LIPITOR) 10 MG tablet Take 1 tablet (10 mg total) by mouth daily.  . blood glucose meter kit and supplies Dispense based on patient and insurance preference. Patient to check blood sugars daily, prior to eating and also as needed (FOR ICD-10 E10.9, E11.9).  . carvedilol (COREG) 3.125 MG tablet Take 1 tablet (3.125 mg total) by mouth 2 (two) times daily with a meal.  . chlorthalidone (HYGROTON) 25 MG tablet Take 1 tablet (25 mg total) by mouth daily.  . cholecalciferol (VITAMIN D3) 25 MCG (1000 UNIT) tablet Take 1,000 Units by mouth daily.  . Cyanocobalamin (VITAMIN B 12 PO) Take 1 tablet by mouth daily.  . cyclobenzaprine (FLEXERIL) 10 MG tablet Take 1 tablet (10 mg total) by mouth at bedtime as needed for muscle spasms.  . empagliflozin (JARDIANCE) 25 MG TABS tablet Take 1 tablet (25 mg total)  by mouth daily before breakfast.  . ketoconazole (NIZORAL) 2 % cream Apply 1 Application topically daily.  . magic mouthwash SOLN Take 5 mLs by mouth 4 (four) times daily as needed for mouth pain. Swish and swallow or spit  . meclizine (ANTIVERT) 25 MG tablet TAKE ONE-HALF TABLET BY MOUTH THREE TIMES A DAY AS NEEDED FOR DIZZINESS  . nicotine (NICODERM CQ - DOSED IN MG/24 HOURS) 21 mg/24hr patch Place 1 patch (21 mg total) onto the skin daily.  Marland Kitchen omeprazole (PRILOSEC) 20 MG capsule Take 1 capsule (20 mg total) by mouth daily.  . prochlorperazine (COMPAZINE) 10 MG tablet Take 1 tablet (10 mg total) by mouth every 6 (six) hours as needed.  Marland Kitchen Propylene Glycol (SYSTANE BALANCE OP) Place 1 drop into both eyes daily.  . Semaglutide,0.25 or 0.5MG /DOS, 2 MG/3ML SOPN Inject 0.25 mg into the skin once a week.  . sildenafil (VIAGRA) 100 MG tablet TAKE ONE TABLET BY MOUTH AS INSTRUCTED (TAKE 1 HOUR PRIOR TO SEXUAL ACTIVITY *DO NOT EXCEED 1 DOSE PER 24 HOUR PERIOD*)  . triamcinolone cream (KENALOG) 0.5 % Apply 1 Application topically 2 (two) times daily.  Marland Kitchen zinc gluconate 50 MG tablet Take 50 mg by mouth daily.   No facility-administered encounter medications on file as of 04/21/2023.    Past Medical History:  Diagnosis Date  . Arthritis   . Chronic kidney disease   . Depression   . DM (diabetes mellitus) (HCC)    type II  . Dyslipidemia   . Dyspnea    unable to walk much - he thinks it is from   . GERD (gastroesophageal reflux disease)   . Hepatitis    Hepatitis C- treated  . History of kidney stones   . HTN (hypertension)   . Lung cancer (HCC)   . Neuropathy   . nscl ca dx'd 10/2019  . OSA on CPAP   . PTSD (post-traumatic stress disorder)     Past Surgical History:  Procedure Laterality Date  . BIOPSY OF MEDIASTINAL MASS  12/10/2019   Procedure: BIOPSY OF MEDIASTINAL MASS;  Surgeon: Josephine Igo, DO;  Location: [facility];  Service: Pulmonary;;  right upper lobe  . BRONCHIAL NEEDLE  ASPIRATION BIOPSY  12/10/2019   Procedure: BRONCHIAL NEEDLE ASPIRATION BIOPSIES;  Surgeon: Josephine Igo, DO;  Location: [facility];  Service: Pulmonary;;  . COLONOSCOPY    . IR IMAGING GUIDED PORT INSERTION  12/31/2019  . kidney stone    . URETERAL STENT PLACEMENT    . Ureteral Stent Removed    . VIDEO BRONCHOSCOPY WITH ENDOBRONCHIAL ULTRASOUND N/A 12/10/2019   Procedure: VIDEO BRONCHOSCOPY WITH ENDOBRONCHIAL ULTRASOUND;  Surgeon: Josephine Igo, DO;  Location: [facility];  Service: Pulmonary;  Laterality: N/A;    Family History  Problem Relation Age of Onset  . Liver disease Mother   . Diabetes Father   . Diabetes Brother   . Esophageal cancer Neg Hx   . Colon cancer Neg Hx   . Stomach cancer Neg Hx   . Pancreatic cancer Neg Hx     Social History   Socioeconomic History  . Marital status: Single    Spouse name: Not on file  . Number of children: Not on file  . Years of education: Not on file  . Highest education level: Not on file  Occupational History  . Occupation: Oncologist: [id number]  Tobacco Use  . Smoking status: Some Days    Types: Cigars    Passive exposure: Past  . Smokeless tobacco: Never  . Tobacco comments:    05/23/22. : 1-2 Black & Milds / LW  Vaping Use  . Vaping Use: Never used  Substance and Sexual Activity  . Alcohol use: Yes    Alcohol/week: 6.0 standard drinks of alcohol    Types: 6 Cans of beer per week    Comment: occ  . Drug use: Never  . Sexual activity: Yes    Birth control/protection: None  Other Topics Concern  . Not on file  Social History Narrative  . Not on file   Social Determinants of Health   Financial Resource Strain: High Risk (03/25/2021)   Overall Financial Resource Strain (CARDIA)   . Difficulty of Paying Living Expenses: Very hard  Food Insecurity: No Food Insecurity (12/15/2022)   Hunger Vital Sign   . Worried About Programme researcher, broadcasting/film/video in the Last Year: Never true   . Ran Out of Food in the Last  Year: Never true  Transportation Needs: No Transportation Needs (02/21/2023)   PRAPARE - Transportation   . Lack of Transportation (Medical): No   . Lack of Transportation (Non-Medical): No  Physical Activity: Not on file  Stress: Stress Concern Present (03/25/2021)   [employer] of Occupational Health - Occupational Stress Questionnaire   .  Feeling of Stress : To some extent  Social Connections: Unknown (03/25/2021)   Social Connection and Isolation Panel [NHANES]   . Frequency of Communication with Friends and Family: Three times a week   . Frequency of Social Gatherings with Friends and Family: Three times a week   . Attends Religious Services: Never   . Active Member of Clubs or Organizations: No   . Attends Banker Meetings: Never   . Marital Status: Not on file  Intimate Partner Violence: Not At Risk (12/15/2022)   Humiliation, Afraid, Rape, and Kick questionnaire   . Fear of Current or Ex-Partner: No   . Emotionally Abused: No   . Physically Abused: No   . Sexually Abused: No    ROS Comprehensive ROS Pertinent positive and negative noted in HPI     Objective    Blood Pressure 128/84   Pulse 80   Respiration 16   Height 5\' 7"  (1.702 m)   Weight 233 lb 3.2 oz (105.8 kg)   Oxygen Saturation 96%   Body Mass Index 36.52 kg/m   Physical Exam Vitals reviewed.  Constitutional:      Appearance: He is obese.  HENT:     Head: Normocephalic.     Right Ear: Tympanic membrane and external ear normal.     Left Ear: Tympanic membrane and external ear normal.     Nose: Nose normal.  Eyes:     Extraocular Movements: Extraocular movements intact.     Pupils: Pupils are equal, round, and reactive to light.  Cardiovascular:     Rate and Rhythm: Normal rate and regular rhythm.  Pulmonary:     Effort: Pulmonary effort is normal.     Breath sounds: Normal breath sounds.  Abdominal:     General: Bowel sounds are normal. There is distension.     Palpations:  Abdomen is soft.  Musculoskeletal:        General: Normal range of motion.     Cervical back: Normal range of motion and neck supple.  Skin:    General: Skin is warm and dry.  Neurological:     Mental Status: He is alert and oriented to person, place, and time.  Psychiatric:        Mood and Affect: Mood normal.        Behavior: Behavior normal.   Assessment & Plan:   Problem List Items Addressed This Visit     DM (diabetes mellitus) (HCC) - Primary   Relevant Orders   POCT glycosylated hemoglobin (Hb A1C) (Completed)    No follow-ups on file.   [name], NP

## 2023-04-24 ENCOUNTER — Telehealth: Payer: Self-pay | Admitting: Primary Care

## 2023-04-24 NOTE — Telephone Encounter (Signed)
Copied from CRM [id number]. Topic: General - Other >> Apr 24, 2023 10:27 AM Everette C wrote: Reason for CRM: The patient would like to be contacted by a member of practice staff when possible  The patient was unable to elaborate on the need for the call  Please contact further when available

## 2023-04-28 NOTE — Telephone Encounter (Signed)
[facility] no answer will try again Monday

## 2023-05-01 ENCOUNTER — Telehealth: Payer: Self-pay | Admitting: Primary Care

## 2023-05-01 NOTE — Telephone Encounter (Signed)
Copied from CRM [id number]. Topic: General - Inquiry >> May 01, 2023  1:54 PM Patsy Lager T wrote: Reason for CRM: patient called in to f/u on the referral and to see if [facility] was contacted for the referral. Please contact patient for an update

## 2023-05-01 NOTE — Telephone Encounter (Signed)
  Returned pt call and lvm making pt aware that referral has done and if he has any questions or concerns to give [place] a call

## 2023-05-02 NOTE — Telephone Encounter (Signed)
Pt called in about referral, I gave him the info, shows that referral was put in today, Please call back to clarify this, because previous notes, say he has to contact the [place] directly for the referral

## 2023-05-04 ENCOUNTER — Telehealth: Payer: Self-pay | Admitting: Primary Care

## 2023-05-04 NOTE — Telephone Encounter (Signed)
Copied from CRM [id number]. Topic: General - Other >> May 04, 2023 10:33 AM Macon Large wrote: Reason for CRM: Pt requests that an order for a CT scan of the lower back is ordered so he can be scheduled for an appt. Cb# [phone]

## 2023-05-05 NOTE — Telephone Encounter (Signed)
Returned pt call and made aware that he will need to wait to till his appt with ortho for imaging pt states he understands and doesn't have any questions or concerns

## 2023-05-09 NOTE — Telephone Encounter (Signed)
Contacted the [place] and spoke to [place] on Friday 05-05-23. Per Tresa Endo pcp needs to order a CT scan for pt. Made kelly aware that the provider referred pt to Ortho and they will order imaging. Per Tresa Endo the pcp will need to place order so pt can be schedule. I informed Tresa Endo that I will need to speak with provider. Tresa Endo provided call back number [phone].  Reached out to pt and informed him of the conversation I had with Tresa Endo and per pt he will reach out to [place]. I explained to pt the same thing I explained to [place]. Pt states he will reach out to the [place]. I provided [employer] and pt will give me a call back

## 2023-05-10 ENCOUNTER — Telehealth (INDEPENDENT_AMBULATORY_CARE_PROVIDER_SITE_OTHER): Payer: Self-pay

## 2023-05-10 NOTE — Telephone Encounter (Signed)
Copied from CRM [id number]. Topic: General - Inquiry >> May 10, 2023  9:11 AM De Blanch wrote: Reason for CRM:Pt is requesting a callback from [facility]. Stated she told him to call her back.  Please advise.

## 2023-05-12 NOTE — Telephone Encounter (Signed)
Returned pt call. Pt states on Monday he is getting imaging done on his back. They will send results to provider and then he go to ortho. Made pt aware that once he gets his imagine done he can contact ortho to schedule. Provided pt with contact info for Ortho. Pt doesn't have any questions or concerns

## 2023-05-15 ENCOUNTER — Telehealth: Payer: Self-pay | Admitting: Licensed Clinical Social Worker

## 2023-05-15 ENCOUNTER — Telehealth (INDEPENDENT_AMBULATORY_CARE_PROVIDER_SITE_OTHER): Payer: Self-pay | Admitting: Licensed Clinical Social Worker

## 2023-05-15 NOTE — Telephone Encounter (Signed)
LCSWA called patient today to introduce herself and to assess patients' mental health needs. Patient did not answer the phone. LCSWA was able to leave a brief message with the patient asking them to return the call. Patient was referred by PCP for Depression, major, single episode, moderate (HCC) .

## 2023-05-15 NOTE — Telephone Encounter (Signed)
Spoke with pt pt stated that he has an appointment with [facility] tomorrow to start therapy. Stated he did not need any other services at the moment.

## 2023-06-16 ENCOUNTER — Telehealth (INDEPENDENT_AMBULATORY_CARE_PROVIDER_SITE_OTHER): Payer: Self-pay | Admitting: Primary Care

## 2023-06-16 NOTE — Telephone Encounter (Signed)
Spoke to pt. Will be at apt.  

## 2023-06-19 ENCOUNTER — Inpatient Hospital Stay: Payer: No Typology Code available for payment source | Attending: Internal Medicine

## 2023-06-19 ENCOUNTER — Ambulatory Visit (INDEPENDENT_AMBULATORY_CARE_PROVIDER_SITE_OTHER): Payer: No Typology Code available for payment source | Admitting: Primary Care

## 2023-06-20 ENCOUNTER — Telehealth (INDEPENDENT_AMBULATORY_CARE_PROVIDER_SITE_OTHER): Payer: Self-pay | Admitting: Primary Care

## 2023-06-20 NOTE — Telephone Encounter (Signed)
Spoke to pt. Will be at apt.  

## 2023-06-21 ENCOUNTER — Encounter (INDEPENDENT_AMBULATORY_CARE_PROVIDER_SITE_OTHER): Payer: Self-pay | Admitting: Primary Care

## 2023-06-21 ENCOUNTER — Ambulatory Visit (INDEPENDENT_AMBULATORY_CARE_PROVIDER_SITE_OTHER): Payer: Medicare PPO | Admitting: Primary Care

## 2023-06-21 VITALS — BP 173/93 | HR 103 | Resp 16 | Wt 243.6 lb

## 2023-06-21 DIAGNOSIS — M545 Low back pain, unspecified: Secondary | ICD-10-CM

## 2023-06-21 DIAGNOSIS — E1159 Type 2 diabetes mellitus with other circulatory complications: Secondary | ICD-10-CM

## 2023-06-21 DIAGNOSIS — N529 Male erectile dysfunction, unspecified: Secondary | ICD-10-CM

## 2023-06-21 DIAGNOSIS — E1122 Type 2 diabetes mellitus with diabetic chronic kidney disease: Secondary | ICD-10-CM | POA: Diagnosis not present

## 2023-06-21 DIAGNOSIS — E1169 Type 2 diabetes mellitus with other specified complication: Secondary | ICD-10-CM | POA: Diagnosis not present

## 2023-06-21 DIAGNOSIS — N1831 Chronic kidney disease, stage 3a: Secondary | ICD-10-CM

## 2023-06-21 DIAGNOSIS — E785 Hyperlipidemia, unspecified: Secondary | ICD-10-CM

## 2023-06-21 DIAGNOSIS — I152 Hypertension secondary to endocrine disorders: Secondary | ICD-10-CM

## 2023-06-21 DIAGNOSIS — Z7985 Long-term (current) use of injectable non-insulin antidiabetic drugs: Secondary | ICD-10-CM

## 2023-06-21 DIAGNOSIS — Z7984 Long term (current) use of oral hypoglycemic drugs: Secondary | ICD-10-CM

## 2023-06-21 DIAGNOSIS — R197 Diarrhea, unspecified: Secondary | ICD-10-CM

## 2023-06-21 DIAGNOSIS — I10 Essential (primary) hypertension: Secondary | ICD-10-CM

## 2023-06-21 MED ORDER — EMPAGLIFLOZIN 25 MG PO TABS
25.0000 mg | ORAL_TABLET | Freq: Every day | ORAL | 1 refills | Status: DC
Start: 2023-06-21 — End: 2023-12-19

## 2023-06-21 MED ORDER — CYCLOBENZAPRINE HCL 10 MG PO TABS
10.0000 mg | ORAL_TABLET | Freq: Every evening | ORAL | 2 refills | Status: AC | PRN
Start: 2023-06-21 — End: ?

## 2023-06-21 MED ORDER — AMLODIPINE BESYLATE 10 MG PO TABS
10.0000 mg | ORAL_TABLET | Freq: Every day | ORAL | 1 refills | Status: DC
Start: 2023-06-21 — End: 2023-12-19

## 2023-06-21 MED ORDER — SEMAGLUTIDE(0.25 OR 0.5MG/DOS) 2 MG/3ML ~~LOC~~ SOPN
0.2500 mg | PEN_INJECTOR | SUBCUTANEOUS | 2 refills | Status: DC
Start: 2023-06-21 — End: 2024-03-20

## 2023-06-21 MED ORDER — ATORVASTATIN CALCIUM 10 MG PO TABS
10.0000 mg | ORAL_TABLET | Freq: Every day | ORAL | 1 refills | Status: DC
Start: 2023-06-21 — End: 2024-03-20

## 2023-06-21 MED ORDER — CARVEDILOL 3.125 MG PO TABS: 3.1250 mg | ORAL_TABLET | Freq: Two times a day (BID) | ORAL | 1 refills | Status: DC

## 2023-06-21 MED ORDER — CHLORTHALIDONE 25 MG PO TABS
25.0000 mg | ORAL_TABLET | Freq: Every day | ORAL | 1 refills | Status: DC
Start: 2023-06-21 — End: 2023-12-19

## 2023-06-21 MED ORDER — OMEPRAZOLE 20 MG PO CPDR
20.0000 mg | DELAYED_RELEASE_CAPSULE | Freq: Every day | ORAL | 1 refills | Status: AC
Start: 2023-06-21 — End: ?

## 2023-06-21 MED ORDER — SILDENAFIL CITRATE 100 MG PO TABS: ORAL_TABLET | ORAL | 5 refills | Status: AC

## 2023-06-21 NOTE — Progress Notes (Signed)
[facility]  Kevin Lambert, is a 67 y.o. male  [id number]  [id number]  DOB - 1956-06-24  No chief complaint on file.      Subjective:   Kevin Lambert is a 67 y.o. male here today for a acute visit.  Bp is extremely elevated he admits to not having Bp medication for 3 weeks due to moving and unable to find them Patient has No headache, No chest pain, No abdominal pain - No Nausea, No new weakness tingling or numbness, No Cough - shortness of breath.  No problems updated.  Allergies  Allergen Reactions   Metformin And Related Other (See Comments)    Severe muscle spasms   Naproxen Other (See Comments)    Pt states it have him off balance.   Oxycontin [Oxycodone] Hives    Past Medical History:  Diagnosis Date   Arthritis    Chronic kidney disease    Depression    DM (diabetes mellitus) (HCC)    type II   Dyslipidemia    Dyspnea    unable to walk much - he thinks it is from    GERD (gastroesophageal reflux disease)    Hepatitis    Hepatitis C- treated   History of kidney stones    HTN (hypertension)    Lung cancer (HCC)    Neuropathy    nscl ca dx'd 10/2019   OSA on CPAP    PTSD (post-traumatic stress disorder)     Current Outpatient Medications on File Prior to Visit  Medication Sig Dispense Refill   amLODipine (NORVASC) 10 MG tablet Take 1 tablet (10 mg total) by mouth daily. 90 tablet 1   Ascorbic Acid (VITAMIN C) 1000 MG tablet Take 1,000 mg by mouth daily.     aspirin EC 81 MG tablet Take 81 mg by mouth daily.     atorvastatin (LIPITOR) 10 MG tablet Take 1 tablet (10 mg total) by mouth daily. 90 tablet 1   blood glucose meter kit and supplies Dispense based on patient and insurance preference. Patient to check blood sugars daily, prior to eating and also as needed (FOR ICD-10 E10.9, E11.9). 1 each 0   carvedilol (COREG) 3.125 MG tablet Take 1 tablet (3.125 mg total) by mouth 2 (two) times daily with a meal. 180 tablet 1   chlorthalidone  (HYGROTON) 25 MG tablet Take 1 tablet (25 mg total) by mouth daily. 90 tablet 1   cholecalciferol (VITAMIN D3) 25 MCG (1000 UNIT) tablet Take 1,000 Units by mouth daily.     Cyanocobalamin (VITAMIN B 12 PO) Take 1 tablet by mouth daily.     cyclobenzaprine (FLEXERIL) 10 MG tablet Take 1 tablet (10 mg total) by mouth at bedtime as needed for muscle spasms. 30 tablet 2   empagliflozin (JARDIANCE) 25 MG TABS tablet Take 1 tablet (25 mg total) by mouth daily before breakfast. 90 tablet 1   ketoconazole (NIZORAL) 2 % cream Apply 1 Application topically daily. 60 g 2   magic mouthwash SOLN Take 5 mLs by mouth 4 (four) times daily as needed for mouth pain. Swish and swallow or spit 240 mL 2   meclizine (ANTIVERT) 25 MG tablet TAKE ONE-HALF TABLET BY MOUTH THREE TIMES A DAY AS NEEDED FOR DIZZINESS 30 tablet 2   nicotine (NICODERM CQ - DOSED IN MG/24 HOURS) 21 mg/24hr patch Place 1 patch (21 mg total) onto the skin daily. 30 patch 2   omeprazole (PRILOSEC) 20 MG capsule Take 1 capsule (20 mg total)  by mouth daily. 90 capsule 1   prochlorperazine (COMPAZINE) 10 MG tablet Take 1 tablet (10 mg total) by mouth every 6 (six) hours as needed. 30 tablet 2   Propylene Glycol (SYSTANE BALANCE OP) Place 1 drop into both eyes daily.     Semaglutide,0.25 or 0.5MG /DOS, 2 MG/3ML SOPN Inject 0.25 mg into the skin once a week. 3 mL 2   sildenafil (VIAGRA) 100 MG tablet TAKE ONE TABLET BY MOUTH AS INSTRUCTED (TAKE 1 HOUR PRIOR TO SEXUAL ACTIVITY *DO NOT EXCEED 1 DOSE PER 24 HOUR PERIOD*) 10 tablet 5   triamcinolone cream (KENALOG) 0.5 % Apply 1 Application topically 2 (two) times daily. 454 g 1   zinc gluconate 50 MG tablet Take 50 mg by mouth daily.     No current facility-administered medications on file prior to visit.    Objective:  Blood Pressure (Abnormal) 173/93 (BP Location: Left Arm, Patient Position: Sitting, Cuff Size: Large)   Pulse (Abnormal) 103   Respiration 16   Weight 243 lb 9.6 oz (110.5 kg)    Oxygen Saturation 97%   Body Mass Index 38.15 kg/m     Comprehensive ROS Pertinent positive and negative noted in HPI   Exam General appearance : Awake, alert, not in any distress. Speech Clear. Not toxic looking HEENT: Atraumatic and Normocephalic, pupils equally reactive to light and accomodation Neck: Supple, no JVD. No cervical lymphadenopathy.  Chest: Good air entry bilaterally, no added sounds  CVS: S1 S2 regular, no murmurs.  Abdomen: Bowel sounds present, Non tender and not distended with no gaurding, rigidity or rebound. Extremities: B/L Lower Ext shows no edema, both legs are warm to touch Neurology: Awake alert, and oriented X 3, CN II-XII intact, Non focal Skin: No Rash  Data Review Lab Results  Component Value Date   HGBA1C 6.5 04/21/2023   HGBA1C 7.3 11/30/2022   HGBA1C 6.6 (H) 07/12/2022    Assessment & Plan  [name] was seen today for hypertension and diabetes.  Diagnoses and all orders for this visit:  Type 2 diabetes mellitus with stage 3a chronic kidney disease, without long-term current use of insulin (HCC) -     empagliflozin (JARDIANCE) 25 MG TABS tablet; Take 1 tablet (25 mg total) by mouth daily before breakfast. -     Semaglutide,0.25 or 0.5MG /DOS, 2 MG/3ML SOPN; Inject 0.25 mg into the skin once a week.  Essential hypertension We have discussed target BP range and blood pressure goal.. We discussed the importance of compliance with medical therapy and DASH diet recommended, consequences of uncontrolled hypertension discussed.  - continue current BP medications   Hypertension associated with type 2 diabetes mellitus (HCC) -     amLODipine (NORVASC) 10 MG tablet; Take 1 tablet (10 mg total) by mouth daily. -     carvedilol (COREG) 3.125 MG tablet; Take 1 tablet (3.125 mg total) by mouth 2 (two) times daily with a meal. -     chlorthalidone (HYGROTON) 25 MG tablet; Take 1 tablet (25 mg total) by mouth daily.  Hyperlipidemia associated with type 2  diabetes mellitus (HCC) -     atorvastatin (LIPITOR) 10 MG tablet; Take 1 tablet (10 mg total) by mouth daily.  Diarrhea, unspecified type -     omeprazole (PRILOSEC) 20 MG capsule; Take 1 capsule (20 mg total) by mouth daily.  Erectile dysfunction, unspecified erectile dysfunction type -     sildenafil (VIAGRA) 100 MG tablet; TAKE ONE TABLET BY MOUTH AS INSTRUCTED (TAKE 1 HOUR PRIOR TO  SEXUAL ACTIVITY *DO NOT EXCEED 1 DOSE PER 24 HOUR PERIOD*)  Acute midline low back pain without sciatica -     cyclobenzaprine (FLEXERIL) 10 MG tablet; Take 1 tablet (10 mg total) by mouth at bedtime as needed for muscle spasms.     Patient have been counseled extensively about nutrition and exercise. Other issues discussed during this visit include: low cholesterol diet, weight control and daily exercise, foot care, annual eye examinations at Ophthalmology, importance of adherence with medications and regular follow-up. We also discussed long term complications of uncontrolled diabetes and hypertension.   The patient was given clear instructions to go to ER or return to medical center if symptoms don't improve, worsen or new problems develop. The patient verbalized understanding. The patient was told to call to get lab results if they haven't heard anything in the next week.   This note has been created with Education officer, environmental. Any transcriptional errors are unintentional.   [name], NP 06/21/2023, 10:37 AM

## 2023-06-23 ENCOUNTER — Telehealth: Payer: Self-pay | Admitting: Medical Oncology

## 2023-06-23 NOTE — Telephone Encounter (Signed)
Returned pt call . His VM is not set up.

## 2023-06-26 ENCOUNTER — Ambulatory Visit: Payer: No Typology Code available for payment source | Admitting: Podiatry

## 2023-06-30 ENCOUNTER — Other Ambulatory Visit: Payer: Self-pay

## 2023-06-30 ENCOUNTER — Telehealth (INDEPENDENT_AMBULATORY_CARE_PROVIDER_SITE_OTHER): Payer: Self-pay | Admitting: Primary Care

## 2023-06-30 NOTE — Progress Notes (Signed)
   Rehman Groesbeck. 05-Sep-1956 [id number]  Patient outreached by Mack Guise , PharmD Candidate on 06/30/2023. Patient is requesting a script for BP cuff Blood Pressure Readings: Last documented ambulatory systolic blood pressure: 173 Last documented ambulatory diastolic blood pressure: 93 Does the patient have a validated home blood pressure machine?: No (Patient is requesting script for BP cuff) They report home readings NA  Medication review was performed. Is the patient taking their medications as prescribed?: Yes Differences from their prescribed list include: NA  The following barriers to adherence were noted: Does the patient have cost concerns?: No Does the patient have transportation concerns?: No Does the patient need assistance obtaining refills?: No Does the patient occassionally forget to take some of their prescribed medications?: No Does the patient feel like one/some of their medications make them feel poorly?: No Does the patient have questions or concerns about their medications?: No Does the patient have a follow up scheduled with their primary care provider/cardiologist?: Yes   Interventions: Interventions Completed: Medications were reviewed, Patient was educated on how to access home blood pressure machine  The patient has follow up scheduled:  PCP: [name], NP   Mack Guise, Student-PharmD

## 2023-06-30 NOTE — Telephone Encounter (Signed)
Will forward to provider  

## 2023-06-30 NOTE — Telephone Encounter (Signed)
Referral Request - Has patient seen PCP for this complaint? Yes.   *If NO, is insurance requiring patient see PCP for this issue before PCP can refer them? Referral for which specialty: MRI of lower back [facility] [place] Reason for referral: lower back pain  [facility] Kevin Lambert needs to send referral for him to have done there

## 2023-07-05 ENCOUNTER — Ambulatory Visit: Payer: No Typology Code available for payment source | Admitting: Podiatry

## 2023-07-05 ENCOUNTER — Ambulatory Visit (INDEPENDENT_AMBULATORY_CARE_PROVIDER_SITE_OTHER): Payer: No Typology Code available for payment source

## 2023-07-05 VITALS — BP 152/93 | HR 92 | Resp 16 | Wt 237.0 lb

## 2023-07-05 DIAGNOSIS — Z0130 Encounter for examination of blood pressure without abnormal findings: Secondary | ICD-10-CM

## 2023-07-08 ENCOUNTER — Emergency Department (HOSPITAL_COMMUNITY)
Admission: EM | Admit: 2023-07-08 | Discharge: 2023-07-08 | Discharge status: Against Medical Advice | Payer: Non-veteran care | Attending: Emergency Medicine | Admitting: Emergency Medicine

## 2023-07-08 ENCOUNTER — Encounter (HOSPITAL_COMMUNITY): Payer: Self-pay | Admitting: *Deleted

## 2023-07-08 ENCOUNTER — Ambulatory Visit (HOSPITAL_COMMUNITY)
Admission: EM | Admit: 2023-07-08 | Discharge: 2023-07-08 | Disposition: A | Discharge status: Home / Self Care | Payer: Medicare PPO | Source: Home / Self Care

## 2023-07-08 ENCOUNTER — Encounter (HOSPITAL_COMMUNITY): Payer: Self-pay

## 2023-07-08 ENCOUNTER — Other Ambulatory Visit: Payer: Self-pay

## 2023-07-08 DIAGNOSIS — Z5321 Procedure and treatment not carried out due to patient leaving prior to being seen by health care provider: Secondary | ICD-10-CM | POA: Insufficient documentation

## 2023-07-08 DIAGNOSIS — R1084 Generalized abdominal pain: Secondary | ICD-10-CM

## 2023-07-08 DIAGNOSIS — R109 Unspecified abdominal pain: Secondary | ICD-10-CM | POA: Insufficient documentation

## 2023-07-08 DIAGNOSIS — R112 Nausea with vomiting, unspecified: Secondary | ICD-10-CM | POA: Diagnosis not present

## 2023-07-08 LAB — CBC
HCT: 46.7 % (ref 39.0–52.0)
Hemoglobin: 16.2 g/dL (ref 13.0–17.0)
MCH: 32.8 pg (ref 26.0–34.0)
MCHC: 34.7 g/dL (ref 30.0–36.0)
MCV: 94.5 fL (ref 80.0–100.0)
Platelets: 194 10*3/uL (ref 150–400)
RBC: 4.94 MIL/uL (ref 4.22–5.81)
RDW: 15.1 % (ref 11.5–15.5)
WBC: 6.6 10*3/uL (ref 4.0–10.5)
nRBC: 0 % (ref 0.0–0.2)

## 2023-07-08 LAB — COMPREHENSIVE METABOLIC PANEL
ALT: 25 U/L (ref 0–44)
AST: 21 U/L (ref 15–41)
Albumin: 3.9 g/dL (ref 3.5–5.0)
Alkaline Phosphatase: 64 U/L (ref 38–126)
Anion gap: 17 — ABNORMAL HIGH (ref 5–15)
BUN: 43 mg/dL — ABNORMAL HIGH (ref 8–23)
CO2: 22 mmol/L (ref 22–32)
Calcium: 10 mg/dL (ref 8.9–10.3)
Chloride: 94 mmol/L — ABNORMAL LOW (ref 98–111)
Creatinine, Ser: 2.6 mg/dL — ABNORMAL HIGH (ref 0.61–1.24)
GFR, Estimated: 26 mL/min — ABNORMAL LOW (ref >60)
Glucose, Bld: 152 mg/dL — ABNORMAL HIGH (ref 70–99)
Potassium: 3.6 mmol/L (ref 3.5–5.1)
Sodium: 133 mmol/L — ABNORMAL LOW (ref 135–145)
Total Bilirubin: 0.7 mg/dL (ref 0.3–1.2)
Total Protein: 7.2 g/dL (ref 6.5–8.1)

## 2023-07-08 LAB — URINALYSIS, ROUTINE W REFLEX MICROSCOPIC
Bacteria, UA: NONE SEEN
Bilirubin Urine: NEGATIVE
Glucose, UA: >=500 mg/dL — AB
Hgb urine dipstick: NEGATIVE
Ketones, ur: NEGATIVE mg/dL
Leukocytes,Ua: NEGATIVE
Nitrite: NEGATIVE
Protein, ur: 30 mg/dL — AB
Specific Gravity, Urine: 1.016 (ref 1.005–1.030)
pH: 5 (ref 5.0–8.0)

## 2023-07-08 LAB — POCT FASTING CBG KUC MANUAL ENTRY: POCT Glucose (KUC): 176 mg/dL — AB (ref 70–99)

## 2023-07-08 LAB — LIPASE, BLOOD: Lipase: 44 U/L (ref 11–51)

## 2023-07-08 MED ORDER — ONDANSETRON 4 MG PO TBDP
ORAL_TABLET | ORAL | Status: AC
  Filled 2023-07-08: qty 1

## 2023-07-08 MED ORDER — ONDANSETRON HCL 4 MG/2ML IJ SOLN
INTRAMUSCULAR | Status: AC
  Filled 2023-07-08: qty 2

## 2023-07-08 MED ORDER — ONDANSETRON HCL 4 MG/2ML IJ SOLN
4.0000 mg | Freq: Once | INTRAMUSCULAR | Status: AC
  Administered 2023-07-08: 4 mg via INTRAMUSCULAR

## 2023-07-08 MED ORDER — ONDANSETRON 4 MG PO TBDP
4.0000 mg | ORAL_TABLET | Freq: Once | ORAL | Status: AC
  Administered 2023-07-08: 4 mg via ORAL

## 2023-07-08 NOTE — ED Triage Notes (Signed)
Patient here today with c/o mid abd pain that started at around 1 pm today. He has had some N&V. He has vomited 7 times since 3 pm.

## 2023-07-08 NOTE — Discharge Instructions (Signed)
We have given you intramuscular Zofran to help with your nausea.  Please head to the nearest emergency department for labs and further evaluation.

## 2023-07-08 NOTE — ED Triage Notes (Signed)
The pt is c/o abd pain since 1330 today with n and vomiting

## 2023-07-08 NOTE — ED Provider Notes (Signed)
[facility]    CSN: [id number] Arrival date & time: 07/08/23  1656      History   Chief Complaint Chief Complaint  Patient presents with   Abdominal Pain    HPI Kevin Lambert. is a 67 y.o. male.   Patient presents to clinic for severe abdominal pain, nausea and vomiting that started around 1:00 today.  Reports initially emesis was his food, now it is clear and yellow.   Denies dysuria, diarrhea or fevers.     The history is provided by the patient and medical records.  Abdominal Pain Associated symptoms: nausea and vomiting   Associated symptoms: no diarrhea, no dysuria and no fever     Past Medical History:  Diagnosis Date   Arthritis    Chronic kidney disease    Depression    DM (diabetes mellitus) (HCC)    type II   Dyslipidemia    Dyspnea    unable to walk much - he thinks it is from    GERD (gastroesophageal reflux disease)    Hepatitis    Hepatitis C- treated   History of kidney stones    HTN (hypertension)    Lung cancer (HCC)    Neuropathy    nscl ca dx'd 10/2019   OSA on CPAP    PTSD (post-traumatic stress disorder)     Patient Active Problem List   Diagnosis Date Noted   Chronic pain of both knees 04/10/2023   H/O tear of meniscus of knee joint 04/10/2023   Hyperlipidemia associated with type 2 diabetes mellitus (HCC) 12/31/2022   Depression, major, single episode, moderate (HCC) 12/31/2022   Other fatigue 07/12/2022   Contact dermatitis 04/17/2022   Sheltered homelessness 02/04/2022   Personal history of colonic polyps 02/04/2022   Housing instability 02/04/2022   Dehydration 12/22/2021   Muscle cramps 12/22/2021   Nonspecific abnormal results of kidney function study 11/23/2021   Tobacco use disorder, continuous 11/23/2021   Diarrhea 11/18/2021   Erectile dysfunction 08/29/2021   Body mass index (BMI) of 37.0-37.9 in adult 08/29/2021   Cigarette nicotine dependence without complication 08/29/2021   Encounter for Medicare  annual wellness exam 05/09/2021   Need for shingles vaccine 05/09/2021   Atherosclerosis of both carotid arteries 02/28/2021   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 02/28/2021   Primary hypertension 02/28/2021   Abnormal renal function 02/28/2021   Dizziness 01/10/2021   Bruit 01/10/2021   Malignant neoplasm of lung (HCC) 01/10/2021   Encounter to establish care 01/10/2021   Terminal illness 01/08/2021   Obstructive sleep apnea 01/08/2021   Renal calculus 01/08/2021   Psychosexual dysfunction with inhibited sexual excitement 01/08/2021   Pain in joint, shoulder region 01/08/2021   Need for prophylactic vaccination and inoculation against influenza 01/08/2021   Hyperlipidemia 01/08/2021   Hemorrhage of rectum and anus 01/08/2021   Family history of malignant neoplasm of gastrointestinal tract 01/08/2021   Dental caries, unspecified 01/08/2021   Cervicalgia 01/08/2021   Cellulitis and abscess 01/08/2021   Back pain 01/08/2021   Adrenal adenoma 01/08/2021   Adenomatous polyp of colon 01/08/2021   Acute upper respiratory infection 01/08/2021   Swelling of both lower extremities 08/03/2020   Healthcare maintenance 05/21/2020   DOE (dyspnea on exertion) 05/21/2020   At risk for obstructive sleep apnea 05/21/2020   Encounter for antineoplastic immunotherapy 04/20/2020   Port-A-Cath in place 01/15/2020   Encounter for antineoplastic chemotherapy 12/16/2019   Goals of care, counseling/discussion 12/16/2019  Mediastinal adenopathy 12/10/2019   Non-small cell cancer of right lung (HCC) 11/29/2019   Tobacco abuse 11/29/2019   Hypertension associated with type 2 diabetes mellitus (HCC) 11/29/2019   DM (diabetes mellitus) (HCC) 11/29/2019   Sleep disturbance 11/07/1993    Past Surgical History:  Procedure Laterality Date   BIOPSY OF MEDIASTINAL MASS  12/10/2019   Procedure: BIOPSY OF MEDIASTINAL MASS;  Surgeon: Josephine Igo, DO;  Location: [facility];  Service: Pulmonary;;  right upper lobe   BRONCHIAL NEEDLE ASPIRATION BIOPSY  12/10/2019   Procedure: BRONCHIAL NEEDLE ASPIRATION BIOPSIES;  Surgeon: Josephine Igo, DO;  Location: [facility];  Service: Pulmonary;;   COLONOSCOPY     IR IMAGING GUIDED PORT INSERTION  12/31/2019   kidney stone     URETERAL STENT PLACEMENT     Ureteral Stent Removed     VIDEO BRONCHOSCOPY WITH ENDOBRONCHIAL ULTRASOUND N/A 12/10/2019   Procedure: VIDEO BRONCHOSCOPY WITH ENDOBRONCHIAL ULTRASOUND;  Surgeon: Josephine Igo, DO;  Location: [facility];  Service: Pulmonary;  Laterality: N/A;       Home Medications    Prior to Admission medications   Medication Sig Start Date End Date Taking? Authorizing Provider  amLODipine (NORVASC) 10 MG tablet Take 1 tablet (10 mg total) by mouth daily. 06/21/23  Yes [name], NP  atorvastatin (LIPITOR) 10 MG tablet Take 1 tablet (10 mg total) by mouth daily. 06/21/23  Yes [name], NP  carvedilol (COREG) 3.125 MG tablet Take 1 tablet (3.125 mg total) by mouth 2 (two) times daily with a meal. 06/21/23  Yes [name], NP  chlorthalidone (HYGROTON) 25 MG tablet Take 1 tablet (25 mg total) by mouth daily. 06/21/23  Yes [name], NP  cyclobenzaprine (FLEXERIL) 10 MG tablet Take 1 tablet (10 mg total) by mouth at bedtime as needed for muscle spasms. 06/21/23  Yes [name], NP  empagliflozin (JARDIANCE) 25 MG TABS tablet Take 1 tablet (25 mg total) by mouth daily before breakfast. 06/21/23  Yes [name], NP  Ascorbic Acid (VITAMIN C) 1000 MG tablet Take 1,000 mg by mouth daily. Patient not taking: Reported on 07/05/2023    [provider]  aspirin EC 81 MG tablet Take 81 mg by mouth daily. Patient not taking: Reported on 07/05/2023    [provider]  blood glucose meter kit and supplies Dispense based on patient and insurance preference. Patient to check blood sugars daily, prior to eating and  also as needed (FOR ICD-10 E10.9, E11.9). 02/22/22   [name], NP  cholecalciferol (VITAMIN D3) 25 MCG (1000 UNIT) tablet Take 1,000 Units by mouth daily.    [provider]  Cyanocobalamin (VITAMIN B 12 PO) Take 1 tablet by mouth daily.    [provider]  ketoconazole (NIZORAL) 2 % cream Apply 1 Application topically daily. 12/20/22   Louann Sjogren, DPM  magic mouthwash SOLN Take 5 mLs by mouth 4 (four) times daily as needed for mouth pain. Swish and swallow or spit 01/28/20   Tanner, Kathrin Greathouse., PA-C  meclizine (ANTIVERT) 25 MG tablet TAKE ONE-HALF TABLET BY MOUTH THREE TIMES A DAY AS NEEDED FOR DIZZINESS 03/30/23   [name], NP  nicotine (NICODERM CQ - DOSED IN MG/24 HOURS) 21 mg/24hr patch Place 1 patch (21 mg total) onto the skin daily. Patient not taking: Reported on 07/05/2023 12/30/22   [name], NP  omeprazole (PRILOSEC) 20 MG capsule Take 1 capsule (20 mg total)  by mouth daily. 06/21/23   [name], NP  prochlorperazine (COMPAZINE) 10 MG tablet Take 1 tablet (10 mg total) by mouth every 6 (six) hours as needed. 11/18/21   Heilingoetter, Cassandra L, PA-C  Propylene Glycol (SYSTANE BALANCE OP) Place 1 drop into both eyes daily.    [provider]  Semaglutide,0.25 or 0.5MG /DOS, 2 MG/3ML SOPN Inject 0.25 mg into the skin once a week. Patient not taking: Reported on 07/05/2023 06/21/23   [name], NP  sildenafil (VIAGRA) 100 MG tablet TAKE ONE TABLET BY MOUTH AS INSTRUCTED (TAKE 1 HOUR PRIOR TO SEXUAL ACTIVITY *DO NOT EXCEED 1 DOSE PER 24 HOUR PERIOD*) 06/21/23   [name], NP  triamcinolone cream (KENALOG) 0.5 % Apply 1 Application topically 2 (two) times daily. 05/26/22   [name], NP  zinc gluconate 50 MG tablet Take 50 mg by mouth daily.    [provider]    Family History Family History  Problem Relation Age of Onset   Liver disease Mother    Diabetes Father    Diabetes Brother    Esophageal  cancer Neg Hx    Colon cancer Neg Hx    Stomach cancer Neg Hx    Pancreatic cancer Neg Hx     Social History Social History   Tobacco Use   Smoking status: Some Days    Types: Cigars    Passive exposure: Past   Smokeless tobacco: Never   Tobacco comments:    05/23/22. : 1-2 Black & Milds / LW  Vaping Use   Vaping status: Never Used  Substance Use Topics   Alcohol use: Yes    Alcohol/week: 6.0 standard drinks of alcohol    Types: 6 Cans of beer per week    Comment: occ   Drug use: Never     Allergies   Metformin and related, Naproxen, and Oxycontin [oxycodone]   Review of Systems Review of Systems  Constitutional:  Negative for fever.  Gastrointestinal:  Positive for abdominal pain, nausea and vomiting. Negative for diarrhea.  Genitourinary:  Negative for dysuria.     Physical Exam Triage Vital Signs ED Triage Vitals  Encounter Vitals Group     BP 07/08/23 1804 (!) 157/90     Systolic BP Percentile --      Diastolic BP Percentile --      Pulse Rate 07/08/23 1804 88     Resp 07/08/23 1804 16     Temp 07/08/23 1804 98.7 F (37.1 C)     Temp Source 07/08/23 1804 Oral     SpO2 07/08/23 1804 90 %     Weight 07/08/23 1804 235 lb (106.6 kg)     Height 07/08/23 1804 5\' 6"  (1.676 m)     Head Circumference --      Peak Flow --      Pain Score 07/08/23 1803 6     Pain Loc --      Pain Education --      Exclude from Growth Chart --    No data found.  Updated Vital Signs BP (!) 157/90 (BP Location: Right Arm)   Pulse 88   Temp 98.7 F (37.1 C) (Oral)   Resp 16   Ht 5\' 6"  (1.676 m)   Wt 235 lb (106.6 kg)   SpO2 90%   BMI 37.93 kg/m   Visual Acuity Right Eye Distance:   Left Eye Distance:   Bilateral Distance:    Right Eye Near:  Left Eye Near:    Bilateral Near:     Physical Exam Vitals and nursing note reviewed.  Constitutional:      Appearance: Normal appearance. He is well-developed.  HENT:     Head: Normocephalic and atraumatic.      Right Ear: External ear normal.     Left Ear: External ear normal.     Nose: Nose normal.     Mouth/Throat:     Mouth: Mucous membranes are moist.  Eyes:     General: No scleral icterus. Cardiovascular:     Rate and Rhythm: Normal rate.  Pulmonary:     Effort: Pulmonary effort is normal. No respiratory distress.  Abdominal:     General: Bowel sounds are normal.     Tenderness: There is abdominal tenderness in the epigastric area. There is no guarding or rebound.  Musculoskeletal:        General: Normal range of motion.  Skin:    General: Skin is warm and dry.  Neurological:     General: No focal deficit present.     Mental Status: He is alert and oriented to person, place, and time.  Psychiatric:        Mood and Affect: Mood normal.        Behavior: Behavior normal.      UC Treatments / Results  Labs (all labs ordered are listed, but only abnormal results are displayed) Labs Reviewed  POCT FASTING CBG [facility] MANUAL ENTRY - Abnormal; Notable for the following components:      Result Value   POCT Glucose ([facility]) 176 (*)    All other components within normal limits    EKG   Radiology No results found.  Procedures Procedures (including critical care time)  Medications Ordered in UC Medications  ondansetron (ZOFRAN-ODT) disintegrating tablet 4 mg (4 mg Oral Given 07/08/23 1814)  ondansetron (ZOFRAN) injection 4 mg (4 mg Intramuscular Given 07/08/23 1832)    Initial Impression / Assessment and Plan / UC Course  I have reviewed the triage vital signs and the nursing notes.  Pertinent labs & imaging results that were available during my care of the patient were reviewed by me and considered in my medical decision making (see chart for details).  Vitals and triage reviewed, patiently is actively vomiting in room despite ODT Zofran administration.  IM Zofran given.  Abdomen appears distended, there is epigastric tenderness to palpation.  Active bowel sounds.  CBG 176 in  clinic, low concern for DKA. He does have CKD. Concern for need for labs and potential imaging due to pain and cyclic emesis. Patient agreeable to plan, will head to [facility] ED via POV. Feel as if patient is stable to transport himself.      Final Clinical Impressions(s) / UC Diagnoses   Final diagnoses:  Generalized abdominal pain  Nausea and vomiting, unspecified vomiting type   Discharge Instructions   None    ED Prescriptions   None    PDMP not reviewed this encounter.   Cash Duce, [place] N, [place] 07/08/23 [phone]

## 2023-07-08 NOTE — ED Notes (Signed)
Patient is being discharged from the Urgent Care and sent to the Emergency Department via self . Per provider, patient is in need of higher level of care due to abd pain, N&V. Patient is aware and verbalizes understanding of plan of care.  Vitals:   07/08/23 1804  BP: (!) 157/90  Pulse: 88  Resp: 16  Temp: 98.7 F (37.1 C)  SpO2: 90%

## 2023-07-08 NOTE — ED Notes (Signed)
Pt did not want to wait and left. 

## 2023-07-12 NOTE — Telephone Encounter (Signed)
Will forward to provider  

## 2023-07-12 NOTE — Telephone Encounter (Signed)
Pt is calling in because he would like to have his MRI done at the [facility] in [place]. Pt says when he went to the [facility] to have an MRI they told him he needed an appointment and to contact his doctor. Per pt, he needs Marcelino Duster to call the [facility] and tell them he should have the MRI done. Please follow up with pt.

## 2023-07-14 ENCOUNTER — Encounter: Payer: Self-pay | Admitting: Pharmacist

## 2023-07-19 ENCOUNTER — Ambulatory Visit (INDEPENDENT_AMBULATORY_CARE_PROVIDER_SITE_OTHER): Payer: No Typology Code available for payment source

## 2023-07-19 ENCOUNTER — Ambulatory Visit (INDEPENDENT_AMBULATORY_CARE_PROVIDER_SITE_OTHER): Payer: No Typology Code available for payment source | Admitting: Podiatry

## 2023-07-19 VITALS — BP 143/84

## 2023-07-19 DIAGNOSIS — Z91199 Patient's noncompliance with other medical treatment and regimen due to unspecified reason: Secondary | ICD-10-CM

## 2023-07-19 DIAGNOSIS — Z0130 Encounter for examination of blood pressure without abnormal findings: Secondary | ICD-10-CM

## 2023-07-19 NOTE — Progress Notes (Signed)
No show

## 2023-07-27 ENCOUNTER — Telehealth (INDEPENDENT_AMBULATORY_CARE_PROVIDER_SITE_OTHER): Payer: Self-pay | Admitting: Primary Care

## 2023-07-27 NOTE — Telephone Encounter (Signed)
Referral Request - Has patient seen PCP for this complaint? Yes.   *If NO, is insurance requiring patient see PCP for this issue before PCP can refer them? Referral for which specialty: dermatology Preferred provider/office: any Reason for referral: pt states when he saw Marcelino Duster this was one of the referral request he had.  And it should go through his [facility]. And he has not heard from anyone.

## 2023-07-31 ENCOUNTER — Ambulatory Visit (INDEPENDENT_AMBULATORY_CARE_PROVIDER_SITE_OTHER): Payer: No Typology Code available for payment source

## 2023-07-31 VITALS — BP 148/82

## 2023-07-31 DIAGNOSIS — Z0130 Encounter for examination of blood pressure without abnormal findings: Secondary | ICD-10-CM

## 2023-08-02 ENCOUNTER — Encounter: Payer: Self-pay | Admitting: Podiatry

## 2023-08-02 ENCOUNTER — Ambulatory Visit (INDEPENDENT_AMBULATORY_CARE_PROVIDER_SITE_OTHER): Payer: Medicare PPO | Admitting: Podiatry

## 2023-08-02 DIAGNOSIS — E1165 Type 2 diabetes mellitus with hyperglycemia: Secondary | ICD-10-CM

## 2023-08-02 DIAGNOSIS — M79675 Pain in left toe(s): Secondary | ICD-10-CM | POA: Diagnosis not present

## 2023-08-02 DIAGNOSIS — M79674 Pain in right toe(s): Secondary | ICD-10-CM | POA: Diagnosis not present

## 2023-08-02 DIAGNOSIS — B351 Tinea unguium: Secondary | ICD-10-CM

## 2023-08-02 MED ORDER — KETOCONAZOLE 2 % EX CREA: 1.0000 | TOPICAL_CREAM | Freq: Every day | CUTANEOUS | 2 refills | Status: DC

## 2023-08-02 NOTE — Progress Notes (Signed)
Subjective:  Patient ID: Kevin Mast., male    DOB: 16-May-1956,   MRN: [id number]  Chief Complaint  Patient presents with   Nail Problem    DFC.    67 y.o. male presents for concern of thickened elongated and painful nails that are difficult to trim. Requesting to have them trimmed today. Relates burning and tingling in their feet. Patient is diabetic and last A1c was  Lab Results  Component Value Date   HGBA1C 6.5 04/21/2023   .   PCP:  [name], NP    . Denies any other pedal complaints. Denies n/v/f/c.   Past Medical History:  Diagnosis Date   Arthritis    Chronic kidney disease    Depression    DM (diabetes mellitus) (HCC)    type II   Dyslipidemia    Dyspnea    unable to walk much - he thinks it is from    GERD (gastroesophageal reflux disease)    Hepatitis    Hepatitis C- treated   History of kidney stones    HTN (hypertension)    Lung cancer (HCC)    Neuropathy    nscl ca dx'd 10/2019   OSA on CPAP    PTSD (post-traumatic stress disorder)     Objective:  Physical Exam: Vascular: DP/PT pulses 2/4 bilateral. CFT <3 seconds. Absent hair growth on digits. Edema noted to bilateral lower extremities. Xerosis noted bilaterally.  Skin. No lacerations or abrasions bilateral feet. Nails 1-5 bilateral  are thickened discolored and elongated with subungual debris.  Musculoskeletal: MMT 5/5 bilateral lower extremities in DF, PF, Inversion and Eversion. Deceased ROM in DF of ankle joint.  Neurological: Sensation intact to light touch. Protective sensation diminished bilateral.    Assessment:   1. Pain due to onychomycosis of toenails of both feet   2. Type 2 diabetes mellitus with hyperglycemia, without long-term current use of insulin (HCC)       Plan:  Patient was evaluated and treated and all questions answered. -Discussed and educated patient on diabetic foot care, especially with  regards to the vascular, neurological and musculoskeletal  systems.  -Stressed the importance of good glycemic control and the detriment of not  controlling glucose levels in relation to the foot. -Discussed supportive shoes at all times and checking feet regularly.  -Ketoconazole refilled.  -Mechanically debrided all nails 1-5 bilateral using sterile nail nipper and filed with dremel without incident  -Answered all patient questions -Patient to return  in 3 months for at risk foot care -Patient advised to call the office if any problems or questions arise in the meantime.   Louann Sjogren, DPM

## 2023-08-04 ENCOUNTER — Telehealth: Payer: Self-pay | Admitting: Primary Care

## 2023-08-04 NOTE — Telephone Encounter (Unsigned)
Copied from CRM [id number]. Topic: General - Other >> Aug 04, 2023 11:30 AM Epimenio Foot F wrote: Reason for CRM: Pt is calling in returning a call from the office. Please follow up with pt.

## 2023-08-04 NOTE — Telephone Encounter (Signed)
Returned pt call to get more info on why he is needing to see Dermatology. Pt didn't answer lvm

## 2023-08-08 ENCOUNTER — Other Ambulatory Visit (INDEPENDENT_AMBULATORY_CARE_PROVIDER_SITE_OTHER): Payer: Self-pay | Admitting: Primary Care

## 2023-08-08 ENCOUNTER — Telehealth (INDEPENDENT_AMBULATORY_CARE_PROVIDER_SITE_OTHER): Payer: Self-pay

## 2023-08-08 DIAGNOSIS — L819 Disorder of pigmentation, unspecified: Secondary | ICD-10-CM

## 2023-08-08 NOTE — Progress Notes (Signed)
Orders only

## 2023-08-08 NOTE — Telephone Encounter (Signed)
Copied from CRM [id number]. Topic: Referral - Request for Referral >> Aug 07, 2023  9:01 AM [place] B wrote: Reason for CRM: dematologist referral  Has patient seen PCP for this complaint? [facility]   *If NO, is insurance requiring patient see PCP for this issue before PCP can refer them? Referral for which specialty: dermatologist Preferred provider/office: ? Reason for referral: irritation on forehead

## 2023-08-08 NOTE — Telephone Encounter (Signed)
Pt is requesting a referral for dermatology for rash/irritation of the forehead. Please place referral

## 2023-08-15 ENCOUNTER — Telehealth: Payer: Self-pay | Admitting: Podiatry

## 2023-08-15 NOTE — Telephone Encounter (Signed)
Pt would like the rx written so he can pick up. If not he would like it sent the [facility].  Please advise

## 2023-08-16 ENCOUNTER — Other Ambulatory Visit: Payer: Self-pay | Admitting: Podiatry

## 2023-08-16 MED ORDER — KETOCONAZOLE 2 % EX CREA: 1.0000 | TOPICAL_CREAM | Freq: Every day | CUTANEOUS | 2 refills | Status: DC

## 2023-08-16 NOTE — Telephone Encounter (Signed)
I can send it to the [facility] but he can also come pick it up whenever he is avaialble.

## 2023-08-21 ENCOUNTER — Inpatient Hospital Stay: Payer: No Typology Code available for payment source | Attending: Internal Medicine

## 2023-08-22 ENCOUNTER — Other Ambulatory Visit: Payer: Self-pay | Admitting: Podiatry

## 2023-08-22 ENCOUNTER — Encounter: Payer: Self-pay | Admitting: Internal Medicine

## 2023-08-22 MED ORDER — KETOCONAZOLE 2 % EX CREA: 1.0000 | TOPICAL_CREAM | Freq: Every day | CUTANEOUS | 2 refills | Status: DC

## 2023-08-22 NOTE — Telephone Encounter (Signed)
TC

## 2023-08-28 ENCOUNTER — Ambulatory Visit (INDEPENDENT_AMBULATORY_CARE_PROVIDER_SITE_OTHER): Payer: No Typology Code available for payment source

## 2023-08-28 ENCOUNTER — Telehealth: Payer: Self-pay | Admitting: *Deleted

## 2023-08-28 ENCOUNTER — Encounter (INDEPENDENT_AMBULATORY_CARE_PROVIDER_SITE_OTHER): Payer: Self-pay

## 2023-08-28 VITALS — Ht 66.0 in | Wt 235.0 lb

## 2023-08-28 DIAGNOSIS — Z0000 Encounter for general adult medical examination without abnormal findings: Secondary | ICD-10-CM | POA: Diagnosis not present

## 2023-08-28 DIAGNOSIS — Z59819 Housing instability, housed unspecified: Secondary | ICD-10-CM

## 2023-08-28 DIAGNOSIS — Z5941 Food insecurity: Secondary | ICD-10-CM | POA: Diagnosis not present

## 2023-08-28 DIAGNOSIS — Z5986 Financial insecurity: Secondary | ICD-10-CM

## 2023-08-28 DIAGNOSIS — N1831 Chronic kidney disease, stage 3a: Secondary | ICD-10-CM

## 2023-08-28 DIAGNOSIS — Z0100 Encounter for examination of eyes and vision without abnormal findings: Secondary | ICD-10-CM

## 2023-08-28 NOTE — Progress Notes (Signed)
Because this visit was a virtual/telehealth visit,  certain criteria was not obtained, such a blood pressure, CBG if applicable, and timed get up and go. Any medications not marked as "taking" were not mentioned during the medication reconciliation part of the visit. Any vitals not documented were not able to be obtained due to this being a telehealth visit or patient was unable to self-report a recent blood pressure reading due to a lack of equipment at home via telehealth. Vitals that have been documented are verbally provided by the patient.   Subjective:   Kevin Lambert. is a 67 y.o. male who presents for Medicare Annual/Subsequent preventive examination.  Visit Complete: Virtual I connected with  Kevin Lambert. on 08/28/23 by a audio enabled telemedicine application and verified that I am speaking with the correct person using two identifiers.  Patient Location: Home  Provider Location: Home Office  I discussed the limitations of evaluation and management by telemedicine. The patient expressed understanding and agreed to proceed.  Vital Signs: Because this visit was a virtual/telehealth visit, some criteria may be missing or patient reported. Any vitals not documented were not able to be obtained and vitals that have been documented are patient reported.  Patient Medicare AWV questionnaire was completed by the patient on na; I have confirmed that all information answered by patient is correct and no changes since this date.  Cardiac Risk Factors include: advanced age (>53men, >68 women);diabetes mellitus;dyslipidemia;hypertension;male gender;obesity (BMI >30kg/m2);sedentary lifestyle;smoking/ tobacco exposure     Objective:    Today's Vitals   08/28/23 1318 08/28/23 1319  Weight: 235 lb (106.6 kg)   Height: 5\' 6"  (1.676 m)   PainSc:  4    Body mass index is 37.93 kg/m.     08/28/2023    1:24 PM 07/08/2023    7:33 PM 03/01/2023    1:45 PM 10/07/2021   11:01 AM 09/16/2021    11:18 AM 08/05/2021    2:13 PM 06/24/2021    3:21 PM  Advanced Directives  Does Patient Have a Medical Advance Directive? No No No No No No No  Would patient like information on creating a medical advance directive? No - Patient declined    No - Patient declined No - Patient declined No - Patient declined    Current Medications (verified) Outpatient Encounter Medications as of 08/28/2023  Medication Sig   amLODipine (NORVASC) 10 MG tablet Take 1 tablet (10 mg total) by mouth daily.   atorvastatin (LIPITOR) 10 MG tablet Take 1 tablet (10 mg total) by mouth daily.   blood glucose meter kit and supplies Dispense based on patient and insurance preference. Patient to check blood sugars daily, prior to eating and also as needed (FOR ICD-10 E10.9, E11.9).   carvedilol (COREG) 3.125 MG tablet Take 1 tablet (3.125 mg total) by mouth 2 (two) times daily with a meal.   chlorthalidone (HYGROTON) 25 MG tablet Take 1 tablet (25 mg total) by mouth daily.   cholecalciferol (VITAMIN D3) 25 MCG (1000 UNIT) tablet Take 1,000 Units by mouth daily.   Cyanocobalamin (VITAMIN B 12 PO) Take 1 tablet by mouth daily.   cyclobenzaprine (FLEXERIL) 10 MG tablet Take 1 tablet (10 mg total) by mouth at bedtime as needed for muscle spasms.   empagliflozin (JARDIANCE) 25 MG TABS tablet Take 1 tablet (25 mg total) by mouth daily before breakfast.   ketoconazole (NIZORAL) 2 % cream Apply 1 Application topically daily.   magic mouthwash SOLN Take 5 mLs by mouth  4 (four) times daily as needed for mouth pain. Swish and swallow or spit   meclizine (ANTIVERT) 25 MG tablet TAKE ONE-HALF TABLET BY MOUTH THREE TIMES A DAY AS NEEDED FOR DIZZINESS   omeprazole (PRILOSEC) 20 MG capsule Take 1 capsule (20 mg total) by mouth daily.   prochlorperazine (COMPAZINE) 10 MG tablet Take 1 tablet (10 mg total) by mouth every 6 (six) hours as needed.   Propylene Glycol (SYSTANE BALANCE OP) Place 1 drop into both eyes daily.   sildenafil (VIAGRA)  100 MG tablet TAKE ONE TABLET BY MOUTH AS INSTRUCTED (TAKE 1 HOUR PRIOR TO SEXUAL ACTIVITY *DO NOT EXCEED 1 DOSE PER 24 HOUR PERIOD*)   triamcinolone cream (KENALOG) 0.5 % Apply 1 Application topically 2 (two) times daily.   zinc gluconate 50 MG tablet Take 50 mg by mouth daily.   Ascorbic Acid (VITAMIN C) 1000 MG tablet Take 1,000 mg by mouth daily. (Patient not taking: Reported on 07/05/2023)   aspirin EC 81 MG tablet Take 81 mg by mouth daily. (Patient not taking: Reported on 07/05/2023)   nicotine (NICODERM CQ - DOSED IN MG/24 HOURS) 21 mg/24hr patch Place 1 patch (21 mg total) onto the skin daily. (Patient not taking: Reported on 07/05/2023)   Semaglutide,0.25 or 0.5MG /DOS, 2 MG/3ML SOPN Inject 0.25 mg into the skin once a week. (Patient not taking: Reported on 07/05/2023)   No facility-administered encounter medications on file as of 08/28/2023.    Allergies (verified) Metformin and related, Naproxen, and Oxycontin [oxycodone]   History: Past Medical History:  Diagnosis Date   Arthritis    Chronic kidney disease    Depression    DM (diabetes mellitus) (HCC)    type II   Dyslipidemia    Dyspnea    unable to walk much - he thinks it is from    GERD (gastroesophageal reflux disease)    Hepatitis    Hepatitis C- treated   History of kidney stones    HTN (hypertension)    Lung cancer (HCC)    Neuropathy    nscl ca dx'd 10/2019   OSA on CPAP    PTSD (post-traumatic stress disorder)    Past Surgical History:  Procedure Laterality Date   BIOPSY OF MEDIASTINAL MASS  12/10/2019   Procedure: BIOPSY OF MEDIASTINAL MASS;  Surgeon: Josephine Igo, DO;  Location: [facility];  Service: Pulmonary;;  right upper lobe   BRONCHIAL NEEDLE ASPIRATION BIOPSY  12/10/2019   Procedure: BRONCHIAL NEEDLE ASPIRATION BIOPSIES;  Surgeon: Josephine Igo, DO;  Location: [facility];  Service: Pulmonary;;   COLONOSCOPY     IR IMAGING GUIDED PORT INSERTION  12/31/2019   kidney stone     URETERAL STENT  PLACEMENT     Ureteral Stent Removed     VIDEO BRONCHOSCOPY WITH ENDOBRONCHIAL ULTRASOUND N/A 12/10/2019   Procedure: VIDEO BRONCHOSCOPY WITH ENDOBRONCHIAL ULTRASOUND;  Surgeon: Josephine Igo, DO;  Location: [facility];  Service: Pulmonary;  Laterality: N/A;   Family History  Problem Relation Age of Onset   Liver disease Mother    Diabetes Father    Diabetes Brother    Esophageal cancer Neg Hx    Colon cancer Neg Hx    Stomach cancer Neg Hx    Pancreatic cancer Neg Hx    Social History   Socioeconomic History   Marital status: Single    Spouse name: Not on file   Number of children: Not on file   Years of education: Not on file  Highest education level: Not on file  Occupational History   Occupation: Oncologist: [email or web address]  Tobacco Use   Smoking status: Some Days    Types: Cigars    Passive exposure: Past   Smokeless tobacco: Never   Tobacco comments:    05/23/22. : 1-2 Black & Milds / LW  Vaping Use   Vaping status: Never Used  Substance and Sexual Activity   Alcohol use: Yes    Alcohol/week: 6.0 standard drinks of alcohol    Types: 6 Cans of beer per week    Comment: occ   Drug use: Never   Sexual activity: Yes    Birth control/protection: None  Other Topics Concern   Not on file  Social History Narrative   Not on file   Social Determinants of Health   Financial Resource Strain: High Risk (08/28/2023)   Overall Financial Resource Strain (CARDIA)    Difficulty of Paying Living Expenses: Hard  Food Insecurity: Food Insecurity Present (08/28/2023)   Hunger Vital Sign    Worried About Running Out of Food in the Last Year: Often true    Ran Out of Food in the Last Year: Never true  Transportation Needs: No Transportation Needs (08/28/2023)   PRAPARE - Administrator, Civil Service (Medical): No    Lack of Transportation (Non-Medical): No  Physical Activity: Inactive (08/28/2023)   Exercise Vital Sign    Days of Exercise per Week: 0  days    Minutes of Exercise per Session: 0 min  Stress: Stress Concern Present (08/28/2023)   [employer] of Occupational Health - Occupational Stress Questionnaire    Feeling of Stress : Rather much  Social Connections: Socially Isolated (08/28/2023)   Social Connection and Isolation Panel [NHANES]    Frequency of Communication with Friends and Family: More than three times a week    Frequency of Social Gatherings with Friends and Family: More than three times a week    Attends Religious Services: Never    Database administrator or Organizations: No    Attends Banker Meetings: Never    Marital Status: Never married    Tobacco Counseling Ready to quit: Yes Counseling given: Yes Tobacco comments: 05/23/22. : 1-2 Black & Milds / LW   Clinical Intake:  Pre-visit preparation completed: Yes  Pain : 0-10 Pain Score: 4  Pain Type: Chronic pain Pain Location: Back Pain Orientation: Lower Pain Descriptors / Indicators: Constant, Dull, Aching Pain Onset: More than a month ago Pain Frequency: Constant     BMI - recorded: 37.93 Nutritional Status: BMI > 30  Obese Nutritional Risks: None Diabetes: Yes CBG done?: No (telehealth visit. unable to obtain cbg) Did pt. bring in CBG monitor from home?: No  How often do you need to have someone help you when you read instructions, pamphlets, or other written materials from your doctor or pharmacy?: 1 - Never  Interpreter Needed?: No  Information entered by :: Abby Jaselyn Nahm, CMA   Activities of Daily Living    08/28/2023    1:23 PM  In your present state of health, do you have any difficulty performing the following activities:  Hearing? 0  Vision? 0  Comment patient is due for an eye exam. Referral placed for patient today.  Difficulty concentrating or making decisions? 0  Walking or climbing stairs? 0  Dressing or bathing? 0  Doing errands, shopping? 0  Preparing Food and eating ? N  Using the Toilet?  N   In the past six months, have you accidently leaked urine? N  Do you have problems with loss of bowel control? N  Managing your Medications? N  Managing your Finances? N  Housekeeping or managing your Housekeeping? N    Patient Care Team: [name], NP as PCP - General (Internal Medicine)  Indicate any recent Medical Services you may have received from other than [facility] providers in the past year (date may be approximate).     Assessment:   This is a routine wellness examination for Kevin Lambert.  Hearing/Vision screen Hearing Screening - Comments:: Patient denies any hearing difficulties.   Vision Screening - Comments:: Patient is due for a diabetic eye exam. Referral placed for patient today.    Goals Addressed             This Visit's Progress    Patient Stated       My goal is to get healthier. Recapture my health.        Depression Screen    08/28/2023    1:27 PM 07/05/2023    3:48 PM 06/21/2023   11:19 AM 04/21/2023   11:53 AM 03/30/2023    1:14 PM 11/30/2022    2:01 PM 07/12/2022   11:13 AM  PHQ 2/9 Scores  PHQ - 2 Score 6 2 2 1 4 2  0  PHQ- 9 Score 12 12 11 7 13 8 5     Fall Risk    08/28/2023    1:25 PM 07/05/2023    3:43 PM 06/21/2023   11:19 AM 04/21/2023    9:06 AM 11/30/2022    2:02 PM  Fall Risk   Falls in the past year? 0 0 0 0 0  Number falls in past yr: 0 0 0 0 0  Injury with Fall? 0 0 0 0 0  Risk for fall due to : No Fall Risks  No Fall Risks No Fall Risks   Follow up Falls prevention discussed    Falls evaluation completed    MEDICARE RISK AT HOME: Medicare Risk at Home Any stairs in or around the home?: Yes (2 steps to get into the home) If so, are there any without handrails?: Yes Home free of loose throw rugs in walkways, pet beds, electrical cords, etc?: Yes Adequate lighting in your home to reduce risk of falls?: Yes Life alert?: No Use of a cane, walker or w/c?: No Grab bars in the bathroom?: No Shower chair or bench in shower?:  No Elevated toilet seat or a handicapped toilet?: No  TIMED UP AND GO:  Was the test performed?  No    Cognitive Function:        08/28/2023    1:26 PM 04/28/2021   10:22 AM  6CIT Screen  What Year? 0 points 0 points  What month? 0 points 0 points  What time? 0 points 0 points  Count back from 20 0 points 0 points  Months in reverse 0 points 0 points  Repeat phrase 0 points 0 points  Total Score 0 points 0 points    Immunizations Immunization History  Administered Date(s) Administered   Hep A / Hep B 01/06/2016, 02/05/2016, 07/13/2016   Influenza Split 09/04/2014   Influenza-Unspecified 07/11/2007, 08/27/2013, 09/04/2014   Moderna Sars-Covid-2 Vaccination 01/03/2020, 12/31/2020   PFIZER(Purple Top)SARS-COV-2 Vaccination 11/20/2020, 12/11/2020   Pneumococcal-Unspecified 05/21/2020   Tdap 07/13/2017   Zoster Recombinant(Shingrix) 09/22/2020, 04/28/2021    TDAP status: Up to date  Flu Vaccine status:  Due, Education has been provided regarding the importance of this vaccine. Advised may receive this vaccine at local pharmacy or Health Dept. Aware to provide a copy of the vaccination record if obtained from local pharmacy or Health Dept. Verbalized acceptance and understanding.  Pneumococcal vaccine status: Due, Education has been provided regarding the importance of this vaccine. Advised may receive this vaccine at local pharmacy or Health Dept. Aware to provide a copy of the vaccination record if obtained from local pharmacy or Health Dept. Verbalized acceptance and understanding.  Covid-19 vaccine status: Information provided on how to obtain vaccines.   Qualifies for Shingles Vaccine? No   Zostavax completed No   Shingrix Completed?: Yes  Screening Tests Health Maintenance  Topic Date Due   Pneumonia Vaccine 72+ Years old (1 of 2 - PCV) 09/08/1962   OPHTHALMOLOGY EXAM  Never done   Hepatitis C Screening  Never done   Colonoscopy  Never done   COVID-19 Vaccine (3  - Pfizer risk series) 01/28/2021   INFLUENZA VACCINE  06/08/2023   Medicare Annual Wellness (AWV)  07/13/2023   HEMOGLOBIN A1C  10/21/2023   Diabetic kidney evaluation - Urine ACR  12/01/2023   FOOT EXAM  12/21/2023   Diabetic kidney evaluation - eGFR measurement  07/07/2024   DTaP/Tdap/Td (2 - Td or Tdap) 07/14/2027   Zoster Vaccines- Shingrix  Completed   HPV VACCINES  Aged Out    Health Maintenance  Health Maintenance Due  Topic Date Due   Pneumonia Vaccine 67+ Years old (1 of 2 - PCV) 09/08/1962   OPHTHALMOLOGY EXAM  Never done   Hepatitis C Screening  Never done   Colonoscopy  Never done   COVID-19 Vaccine (3 - Pfizer risk series) 01/28/2021   INFLUENZA VACCINE  06/08/2023   Medicare Annual Wellness (AWV)  07/13/2023    Colorectal Cancer Screening: Not recommended at this time due to patient being a cancer patient  Lung Cancer Screening: (Low Dose CT Chest recommended if Age 64-80 years, 20 pack-year currently smoking OR have quit w/in 15years.) does qualify.   Lung Cancer Screening Referral: last screening completed on 04/17/2023. Patient currently in treatment for lung cancer  Additional Screening:  Hepatitis C Screening: does qualify;    Vision Screening: Recommended annual ophthalmology exams for early detection of glaucoma and other disorders of the eye. Is the patient up to date with their annual eye exam?  No  Who is the provider or what is the name of the office in which the patient attends annual eye exams? N/a If pt is not established with a provider, would they like to be referred to a provider to establish care? Yes .   Dental Screening: Recommended annual dental exams for proper oral hygiene  Diabetic Foot Exam: Diabetic Foot Exam: Completed 12/20/2022  Community Resource Referral / Chronic Care Management: CRR required this visit?  Yes   CCM required this visit?  PCP informed of CCM need     Plan:     I have personally reviewed and noted the  following in the patient's chart:   Medical and social history Use of alcohol, tobacco or illicit drugs  Current medications and supplements including opioid prescriptions. Patient is not currently taking opioid prescriptions. Functional ability and status Nutritional status Physical activity Advanced directives List of other physicians Hospitalizations, surgeries, and ER visits in previous 12 months Vitals Screenings to include cognitive, depression, and falls Referrals and appointments  In addition, I have reviewed and discussed with patient certain  preventive protocols, quality metrics, and best practice recommendations. A written personalized care plan for preventive services as well as general preventive health recommendations were provided to patient.     Kevin Lambert, CMA   08/28/2023   After Visit Summary: (MyChart) Due to this being a telephonic visit, the after visit summary with patients personalized plan was offered to patient via MyChart   Nurse Notes: see routing comment

## 2023-08-28 NOTE — Progress Notes (Signed)
Care Coordination   Note   08/28/2023 Name: Kevin Lambert. MRN: [id number] DOB: 1955-11-19  Kevin Lambert. is a 67 y.o. year old male who sees [name], NP for primary care. I reached out to [name] by phone today to offer care coordination services.  [name] was given information about Care Coordination services today including:   The Care Coordination services include support from the care team which includes your Nurse Coordinator, Clinical Social Worker, or Pharmacist.  The Care Coordination team is here to help remove barriers to the health concerns and goals most important to you. Care Coordination services are voluntary, and the patient may decline or stop services at any time by request to their care team member.   Care Coordination Consent Status: Patient agreed to services and verbal consent obtained.   Follow up plan:  Telephone appointment with care coordination team member scheduled for:  09/05/23  Encounter Outcome:  Patient Scheduled  [facility] Coordination Care Guide  Direct Dial: [phone]

## 2023-08-28 NOTE — Progress Notes (Signed)
Care Coordination  Outreach Note  08/28/2023 Name: Kevin Lambert. MRN: [id number] DOB: 12-Dec-1955   Care Coordination Outreach Attempts: An unsuccessful telephone outreach was attempted today to offer the patient information about available care coordination services.  Follow Up Plan:  Additional outreach attempts will be made to offer the patient care coordination information and services.   Encounter Outcome:  No Answer  Gwenevere Ghazi  Care Coordination Care Guide  Direct Dial: [phone]

## 2023-08-28 NOTE — Patient Instructions (Signed)
Kevin Lambert , Thank you for taking time to come for your Medicare Wellness Visit. I appreciate your ongoing commitment to your health goals. Please review the following plan we discussed and let me know if I can assist you in the future.   Referrals/Orders/Follow-Ups/Clinician Recommendations:  Next Medicare Annual Wellness Visit: September 02, 2024 at 8am virtual visit  [x]  Kevin Lambert been referred to see Dr. Sinda Du for an eye exam. If you haven't heard from his office in about a week, please call to schedule your appointment.   Sinda Du, MD [address] [place] [address]  Phone: [phone]  [x]  A referral was placed for you today for community resources. This will check to see what community resources are available for you for anything we discussed during your wellness visit today that you may be having difficulty with such a obtaining food, paying for utilities, or transportation needs. Someone will contact you in a few days. If you haven't heard from someone within the next month, please call the number below  [facility] Concierge Line  [phone]   This is a list of the screening recommended for you and due dates:  Health Maintenance  Topic Date Due   Pneumonia Vaccine (1 of 2 - PCV) 09/08/1962   Eye exam for diabetics  Never done   Hepatitis C Screening  Never done   Colon Cancer Screening  Never done   COVID-19 Vaccine (3 - Pfizer risk series) 01/28/2021   Flu Shot  06/08/2023   Hemoglobin A1C  10/21/2023   Yearly kidney health urinalysis for diabetes  12/01/2023   Complete foot exam   12/21/2023   Yearly kidney function blood test for diabetes  07/07/2024   Medicare Annual Wellness Visit  08/27/2024   DTaP/Tdap/Td vaccine (2 - Td or Tdap) 07/14/2027   Zoster (Shingles) Vaccine  Completed   HPV Vaccine  Aged Out    Advanced directives: (Declined) Advance directive discussed with you today. Even though you declined this today, please call our office should you change  your mind, and we can give you the proper paperwork for you to fill out.  Next Medicare Annual Wellness Visit scheduled for next year: Yes  Preventive Care 42 Years and Older, Male Preventive care refers to lifestyle choices and visits with your health care provider that can promote health and wellness. Preventive care visits are also called wellness exams. What can I expect for my preventive care visit? Counseling During your preventive care visit, your health care provider may ask about your: Medical history, including: Past medical problems. Family medical history. History of falls. Current health, including: Emotional well-being. Home life and relationship well-being. Sexual activity. Memory and ability to understand (cognition). Lifestyle, including: Alcohol, nicotine or tobacco, and drug use. Access to firearms. Diet, exercise, and sleep habits. Work and work Astronomer. Sunscreen use. Safety issues such as seatbelt and bike helmet use. Physical exam Your health care provider will check your: Height and weight. These may be used to calculate your BMI (body mass index). BMI is a measurement that tells if you are at a healthy weight. Waist circumference. This measures the distance around your waistline. This measurement also tells if you are at a healthy weight and may help predict your risk of certain diseases, such as type 2 diabetes and high blood pressure. Heart rate and blood pressure. Body temperature. Skin for abnormal spots. What immunizations do I need?  Vaccines are usually given at various ages, according to a schedule.  Your health care provider will recommend vaccines for you based on your age, medical history, and lifestyle or other factors, such as travel or where you work. What tests do I need? Screening Your health care provider may recommend screening tests for certain conditions. This may include: Lipid and cholesterol levels. Diabetes screening. This is  done by checking your blood sugar (glucose) after you have not eaten for a while (fasting). Hepatitis C test. Hepatitis B test. HIV (human immunodeficiency virus) test. STI (sexually transmitted infection) testing, if you are at risk. Lung cancer screening. Colorectal cancer screening. Prostate cancer screening. Abdominal aortic aneurysm (AAA) screening. You may need this if you are a current or former smoker. Talk with your health care provider about your test results, treatment options, and if necessary, the need for more tests. Follow these instructions at home: Eating and drinking  Eat a diet that includes fresh fruits and vegetables, whole grains, lean protein, and low-fat dairy products. Limit your intake of foods with high amounts of sugar, saturated fats, and salt. Take vitamin and mineral supplements as recommended by your health care provider. Do not drink alcohol if your health care provider tells you not to drink. If you drink alcohol: Limit how much you have to 0-2 drinks a day. Know how much alcohol is in your drink. In the [place] one drink equals one 12 oz bottle of beer (355 mL), one 5 oz glass of wine (148 mL), or one 1 oz glass of hard liquor (44 mL). Lifestyle Brush your teeth every morning and night with fluoride toothpaste. Floss one time each day. Exercise for at least 30 minutes 5 or more days each week. Do not use any products that contain nicotine or tobacco. These products include cigarettes, chewing tobacco, and vaping devices, such as e-cigarettes. If you need help quitting, ask your health care provider. Do not use drugs. If you are sexually active, practice safe sex. Use a condom or other form of protection to prevent STIs. Take aspirin only as told by your health care provider. Make sure that you understand how much to take and what form to take. Work with your health care provider to find out whether it is safe and beneficial for you to take aspirin  daily. Ask your health care provider if you need to take a cholesterol-lowering medicine (statin). Find healthy ways to manage stress, such as: Meditation, yoga, or listening to music. Journaling. Talking to a trusted person. Spending time with friends and family. Safety Always wear your seat belt while driving or riding in a vehicle. Do not drive: If you have been drinking alcohol. Do not ride with someone who has been drinking. When you are tired or distracted. While texting. If you have been using any mind-altering substances or drugs. Wear a helmet and other protective equipment during sports activities. If you have firearms in your house, make sure you follow all gun safety procedures. Minimize exposure to UV radiation to reduce your risk of skin cancer. What's next? Visit your health care provider once a year for an annual wellness visit. Ask your health care provider how often you should have your eyes and teeth checked. Stay up to date on all vaccines. This information is not intended to replace advice given to you by your health care provider. Make sure you discuss any questions you have with your health care provider. Document Revised: 04/21/2021 Document Reviewed: 04/21/2021 Elsevier Patient Education  2024 [employer]. Understanding Your Risk for Falls Millions of  people have serious injuries from falls each year. It is important to understand your risk of falling. Talk with your health care provider about your risk and what you can do to lower it. If you do have a serious fall, make sure to tell your provider. Falling once raises your risk of falling again. How can falls affect me? Serious injuries from falls are common. These include: Broken bones, such as hip fractures. Head injuries, such as traumatic brain injuries (TBI) or concussions. A fear of falling can cause you to avoid activities and stay at home. This can make your muscles weaker and raise your risk for a  fall. What can increase my risk? There are a number of risk factors that increase your risk for falling. The more risk factors you have, the higher your risk of falling. Serious injuries from a fall happen most often to people who are older than 68 years old. Teenagers and young adults ages 73-29 are also at higher risk. Common risk factors include: Weakness in the lower body. Being generally weak or confused due to long-term (chronic) illness. Dizziness or balance problems. Poor vision. Medicines that cause dizziness or drowsiness. These may include: Medicines for your blood pressure, heart, anxiety, insomnia, or swelling (edema). Pain medicines. Muscle relaxants. Other risk factors include: Drinking alcohol. Having had a fall in the past. Having foot pain or wearing improper footwear. Working at a dangerous job. Having any of the following in your home: Tripping hazards, such as floor clutter or loose rugs. Poor lighting. Pets. Having dementia or memory loss. What actions can I take to lower my risk of falling?     Physical activity Stay physically fit. Do strength and balance exercises. Consider taking a regular class to build strength and balance. Yoga and tai chi are good options. Vision Have your eyes checked every year and your prescription for glasses or contacts updated as needed. Shoes and walking aids Wear non-skid shoes. Wear shoes that have rubber soles and low heels. Do not wear high heels. Do not walk around the house in socks or slippers. Use a cane or walker as told by your provider. Home safety Attach secure railings on both sides of your stairs. Install grab bars for your bathtub, shower, and toilet. Use a non-skid mat in your bathtub or shower. Attach bath mats securely with double-sided, non-slip rug tape. Use good lighting in all rooms. Keep a flashlight near your bed. Make sure there is a clear path from your bed to the bathroom. Use night-lights. Do  not use throw rugs. Make sure all carpeting is taped or tacked down securely. Remove all clutter from walkways and stairways, including extension cords. Repair uneven or broken steps and floors. Avoid walking on icy or slippery surfaces. Walk on the grass instead of on icy or slick sidewalks. Use ice melter to get rid of ice on walkways in the winter. Use a cordless phone. Questions to ask your health care provider Can you help me check my risk for a fall? Do any of my medicines make me more likely to fall? Should I take a vitamin D supplement? What exercises can I do to improve my strength and balance? Should I make an appointment to have my vision checked? Do I need a bone density test to check for weak bones (osteoporosis)? Would it help to use a cane or a walker? Where to find more information Centers for Disease Control and Prevention, STEADI: TonerPromos.no Community-Based Fall Prevention Programs: TonerPromos.no [employer]  Institute on Aging: BaseRingTones.pl Contact a health care provider if: You fall at home. You are afraid of falling at home. You feel weak, drowsy, or dizzy. This information is not intended to replace advice given to you by your health care provider. Make sure you discuss any questions you have with your health care provider. Document Revised: 06/27/2022 Document Reviewed: 06/27/2022 Elsevier Patient Education  2024 [employer].

## 2023-08-30 ENCOUNTER — Encounter: Payer: Self-pay | Admitting: Internal Medicine

## 2023-09-05 ENCOUNTER — Ambulatory Visit: Payer: Self-pay | Admitting: Licensed Clinical Social Worker

## 2023-09-05 NOTE — Patient Outreach (Signed)
Care Coordination   09/05/2023 Name: Kevin Lambert. MRN: [id number] DOB: 07/05/1956   Care Coordination Outreach Attempts:  An unsuccessful telephone outreach was attempted for a scheduled appointment today.  Follow Up Plan:  Additional outreach attempts will be made to offer the patient care coordination information and services.   Encounter Outcome:  No Answer   Care Coordination Interventions:  No, not indicated    SIG Jeanie Cooks, PhD [facility], [facility] Social Worker Direct Dial: [phone]  Fax: [phone]

## 2023-09-05 NOTE — Patient Outreach (Signed)
Care Coordination   Initial Visit Note   09/05/2023 Name: Kevin Lambert. MRN: [id number] DOB: [birth year]  Kevin Lambert. is a 67 y.o. year old male who sees [name], NP for primary care. I spoke with  [name] by phone today.  What matters to the patients health and wellness today?  Food insecurities, financial insecurities and transportation     Goals Addressed             This Visit's Progress    Care Coordination Activities       Care Coordination Interventions: Patient is currently out of work and having some difficulties with buying food and will be going to DSS to apply for Food Stamps/SNAPS benefits later this week. Patient stated that if he does not return back to work soon that he may have some difficulties pay other bills SW educated the client on food resources in the community and will mail out some Materials engineer and educated the patient on the GGFF APP (Greater Dietitian).  SW will follow up with the patient in about 2 weeks.         SDOH assessments and interventions completed:  Yes  SDOH Interventions Today    Flowsheet Row Most Recent Value  SDOH Interventions   Food Insecurity Interventions Other (Comment)  [Patient is going to DSS to apply for SNAP benefits, and the SW will mail out a food pantry list for resources]  Housing Interventions Intervention Not Indicated  [SW will still mail out resources four assitance due to the patient beign out of work and may need the resources for the future]  Transportation Interventions Intervention Not Indicated  Utilities Interventions Intervention Not Indicated  [SW will still mail out a list of resources for the future]        Care Coordination Interventions:  Yes, provided  Interventions Today    Flowsheet Row Most Recent Value  General Interventions   General Interventions Discussed/Reviewed General Interventions Discussed, [employer] is currently  out of work and my need help soon, patient is going to go and apply for [employer], the SW will mail some reousrces for food and utility assistance.]        Follow up plan: Follow up call scheduled for 09/19/2023 at 2:45pm    Encounter Outcome:  Patient Visit Completed   Jeanie Cooks, PhD [facility], [facility] Social Worker Direct Dial: [phone]  Fax: [phone]

## 2023-09-05 NOTE — Patient Instructions (Signed)
Visit Information  Thank you for taking time to visit with me today. Please don't hesitate to contact me if I can be of assistance to you.   Following are the goals we discussed today:   Goals Addressed             This Visit's Progress    Care Coordination Activities       Care Coordination Interventions: Patient is currently out of work and having some difficulties with buying food and will be going to DSS to apply for Food Stamps/SNAPS benefits later this week. Patient stated that if he does not return back to work soon that he may have some difficulties pay other bills SW educated the client on food resources in the community and will mail out some Materials engineer and educated the patient on the GGFF APP (Greater Dietitian).  SW will follow up with the patient in about 2 weeks.         Our next appointment is by telephone on 09/19/2023 at 2:45 pm  Please call the care guide team at [phone] if you need to cancel or reschedule your appointment.   If you are experiencing a Mental Health or Behavioral Health Crisis or need someone to talk to, please call the Suicide and Crisis Lifeline: 988 go to [facility] [address], [place] [phone]) call the [facility] Crisis Line: [phone]  Patient verbalizes understanding of instructions and care plan provided today and agrees to view in MyChart. Active MyChart status and patient understanding of how to access instructions and care plan via MyChart confirmed with patient.     Jeanie Cooks, PhD [facility] Social Worker Direct Dial: [phone]  Fax: [phone]

## 2023-09-07 ENCOUNTER — Telehealth: Payer: Self-pay | Admitting: Primary Care

## 2023-09-07 NOTE — Telephone Encounter (Signed)
Copied from CRM [id number]. Topic: General - Other >> Sep 06, 2023 11:30 AM Franchot Heidelberg wrote: Reason for CRM: Needs documentation to support the patient's request for dermatology. They need office notes that include the discussion about the place on his forehead.   Best contact: [phone] direct cell line to [place] from [place].

## 2023-09-14 NOTE — Telephone Encounter (Signed)
Pt following up on referral for dermatology. He states he request 3 mos ago for a dermatology referral.  It was just placed 10/29.  Pt states that is unacceptable.  He would like call back asap to discuss.

## 2023-09-15 NOTE — Telephone Encounter (Signed)
Pt has an appt with provider on 09/18/23. Will be addressed then

## 2023-09-18 ENCOUNTER — Ambulatory Visit (INDEPENDENT_AMBULATORY_CARE_PROVIDER_SITE_OTHER): Payer: Medicare PPO | Admitting: Primary Care

## 2023-09-18 ENCOUNTER — Encounter (INDEPENDENT_AMBULATORY_CARE_PROVIDER_SITE_OTHER): Payer: Self-pay | Admitting: Primary Care

## 2023-09-18 VITALS — BP 145/83 | HR 98 | Resp 16 | Wt 238.0 lb

## 2023-09-18 DIAGNOSIS — N1831 Chronic kidney disease, stage 3a: Secondary | ICD-10-CM | POA: Diagnosis not present

## 2023-09-18 DIAGNOSIS — I10 Essential (primary) hypertension: Secondary | ICD-10-CM | POA: Diagnosis not present

## 2023-09-18 DIAGNOSIS — Z7985 Long-term (current) use of injectable non-insulin antidiabetic drugs: Secondary | ICD-10-CM

## 2023-09-18 DIAGNOSIS — Z7984 Long term (current) use of oral hypoglycemic drugs: Secondary | ICD-10-CM | POA: Diagnosis not present

## 2023-09-18 DIAGNOSIS — E1122 Type 2 diabetes mellitus with diabetic chronic kidney disease: Secondary | ICD-10-CM

## 2023-09-18 NOTE — Progress Notes (Signed)
Diabetes He presents for his follow-up diabetic visit. He has type 2 diabetes mellitus. The initial diagnosis of diabetes was made 40 years ago. His disease course has been stable. There are no hypoglycemic associated symptoms. There are no diabetic associated symptoms. There are no hypoglycemic complications. Symptoms are improving. There are no diabetic complications. Risk factors for coronary artery disease include diabetes mellitus, hypertension, dyslipidemia, male sex and obesity. Current diabetic treatment includes diet. He is compliant with treatment all of the time. He is following a diabetic and generally healthy diet. When asked about meal planning, he reported none. He has not had a previous visit with a dietitian. He participates in exercise intermittently. There is no change in his home blood glucose trend. An ACE inhibitor/angiotensin II receptor blocker is being taken. He sees a podiatrist.Eye exam is not current.  Hypertension This is a chronic problem. The current episode started more than 1 year ago. The problem has been waxing and waning since onset. The problem is controlled. Risk factors for coronary artery disease include obesity, male gender and diabetes mellitus. Past treatments include calcium channel blockers and ACE inhibitors. The current treatment provides mild improvement. There are no compliance problems.

## 2023-09-18 NOTE — Progress Notes (Signed)
[facility]  Kevin Lambert, is a 67 y.o. male  UUV:[id number]  KVQ:[id number]  DOB - 11/27/1955  Chief Complaint  Patient presents with   Diabetes   Hypertension       Subjective:   Kevin Lambert is a 67 y.o. male here today for a follow up visit. Patient has No headache, No chest pain, No abdominal pain - No Nausea, No new weakness tingling or numbness, No Cough - shortness of breath.  Diabetes He presents for his follow-up diabetic visit. He has type 2 diabetes mellitus. The initial diagnosis of diabetes was made 40 years ago. His disease course has been stable. There are no hypoglycemic associated symptoms. There are no diabetic associated symptoms. There are no hypoglycemic complications. Symptoms are improving. There are no diabetic complications. Risk factors for coronary artery disease include diabetes mellitus, hypertension, dyslipidemia, male sex and obesity. Current diabetic treatment includes diet. He is compliant with treatment all of the time. He is following a diabetic and generally healthy diet. When asked about meal planning, he reported none. He has not had a previous visit with a dietitian. He participates in exercise intermittently. There is no change in his home blood glucose trend. An ACE inhibitor/angiotensin II receptor blocker is being taken. He sees a podiatrist.Eye exam is not current.  Hypertension This is a chronic problem. The current episode started more than 1 year ago. The problem has been waxing and waning since onset. The problem is controlled. Risk factors for coronary artery disease include obesity, male gender and diabetes mellitus. Past treatments include calcium channel blockers and ACE inhibitors. The current treatment provides mild improvement. There are no compliance problems.    No problems updated.  Allergies  Allergen Reactions   Metformin And Related Other (See Comments)    Severe muscle spasms   Naproxen Other (See Comments)     Pt states it have him off balance.   Oxycontin [Oxycodone] Hives    Past Medical History:  Diagnosis Date   Arthritis    Chronic kidney disease    Depression    DM (diabetes mellitus) (HCC)    type II   Dyslipidemia    Dyspnea    unable to walk much - he thinks it is from    GERD (gastroesophageal reflux disease)    Hepatitis    Hepatitis C- treated   History of kidney stones    HTN (hypertension)    Lung cancer (HCC)    Neuropathy    nscl ca dx'd 10/2019   OSA on CPAP    PTSD (post-traumatic stress disorder)     Current Outpatient Medications on File Prior to Visit  Medication Sig Dispense Refill   amLODipine (NORVASC) 10 MG tablet Take 1 tablet (10 mg total) by mouth daily. 90 tablet 1   Ascorbic Acid (VITAMIN C) 1000 MG tablet Take 1,000 mg by mouth daily. (Patient not taking: Reported on 07/05/2023)     aspirin EC 81 MG tablet Take 81 mg by mouth daily. (Patient not taking: Reported on 07/05/2023)     atorvastatin (LIPITOR) 10 MG tablet Take 1 tablet (10 mg total) by mouth daily. 90 tablet 1   blood glucose meter kit and supplies Dispense based on patient and insurance preference. Patient to check blood sugars daily, prior to eating and also as needed (FOR ICD-10 E10.9, E11.9). 1 each 0   carvedilol (COREG) 3.125 MG tablet Take 1 tablet (3.125 mg total) by mouth 2 (two) times daily with a meal.  180 tablet 1   chlorthalidone (HYGROTON) 25 MG tablet Take 1 tablet (25 mg total) by mouth daily. 90 tablet 1   cholecalciferol (VITAMIN D3) 25 MCG (1000 UNIT) tablet Take 1,000 Units by mouth daily.     Cyanocobalamin (VITAMIN B 12 PO) Take 1 tablet by mouth daily.     cyclobenzaprine (FLEXERIL) 10 MG tablet Take 1 tablet (10 mg total) by mouth at bedtime as needed for muscle spasms. 30 tablet 2   empagliflozin (JARDIANCE) 25 MG TABS tablet Take 1 tablet (25 mg total) by mouth daily before breakfast. 90 tablet 1   ketoconazole (NIZORAL) 2 % cream Apply 1 Application topically daily.  60 g 2   magic mouthwash SOLN Take 5 mLs by mouth 4 (four) times daily as needed for mouth pain. Swish and swallow or spit 240 mL 2   meclizine (ANTIVERT) 25 MG tablet TAKE ONE-HALF TABLET BY MOUTH THREE TIMES A DAY AS NEEDED FOR DIZZINESS 30 tablet 2   nicotine (NICODERM CQ - DOSED IN MG/24 HOURS) 21 mg/24hr patch Place 1 patch (21 mg total) onto the skin daily. (Patient not taking: Reported on 07/05/2023) 30 patch 2   omeprazole (PRILOSEC) 20 MG capsule Take 1 capsule (20 mg total) by mouth daily. 90 capsule 1   prochlorperazine (COMPAZINE) 10 MG tablet Take 1 tablet (10 mg total) by mouth every 6 (six) hours as needed. 30 tablet 2   Propylene Glycol (SYSTANE BALANCE OP) Place 1 drop into both eyes daily.     Semaglutide,0.25 or 0.5MG /DOS, 2 MG/3ML SOPN Inject 0.25 mg into the skin once a week. (Patient not taking: Reported on 07/05/2023) 3 mL 2   sildenafil (VIAGRA) 100 MG tablet TAKE ONE TABLET BY MOUTH AS INSTRUCTED (TAKE 1 HOUR PRIOR TO SEXUAL ACTIVITY *DO NOT EXCEED 1 DOSE PER 24 HOUR PERIOD*) 10 tablet 5   triamcinolone cream (KENALOG) 0.5 % Apply 1 Application topically 2 (two) times daily. 454 g 1   zinc gluconate 50 MG tablet Take 50 mg by mouth daily.     No current facility-administered medications on file prior to visit.    Objective:   Vitals:   09/18/23 1127  BP: (!) 145/83  Pulse: 98  Resp: 16  SpO2: 97%  Weight: 238 lb (108 kg)    Comprehensive ROS Pertinent positive and negative noted in HPI   Exam General appearance : Awake, alert, not in any distress. Speech Clear. Not toxic looking HEENT: Atraumatic and Normocephalic, pupils equally reactive to light and accomodation Neck: Supple, no JVD. No cervical lymphadenopathy.  Chest: Good air entry bilaterally, no added sounds  CVS: S1 S2 regular, no murmurs.  Abdomen: Bowel sounds present, Non tender and not distended with no gaurding, rigidity or rebound. Extremities: B/L Lower Ext shows no edema, both legs are warm to  touch Neurology: Awake alert, and oriented X 3, CN II-XII intact, Non focal Skin: No Rash  Data Review Lab Results  Component Value Date   HGBA1C 6.5 04/21/2023   HGBA1C 7.3 11/30/2022   HGBA1C 6.6 (H) 07/12/2022    Assessment & Plan  Daymein was seen today for diabetes and hypertension.  Diagnoses and all orders for this visit:  Type 2 diabetes mellitus with stage 3a chronic kidney disease, without long-term current use of insulin (HCC)  Essential hypertension    Patient have been counseled extensively about nutrition and exercise. Other issues discussed during this visit include: low cholesterol diet, weight control and daily exercise, foot care, annual eye  examinations at Ophthalmology, importance of adherence with medications and regular follow-up. We also discussed long term complications of uncontrolled diabetes and hypertension.    The patient was given clear instructions to go to ER or return to medical center if symptoms don't improve, worsen or new problems develop. The patient verbalized understanding. The patient was told to call to get lab results if they haven't heard anything in the next week.   This note has been created with Education officer, environmental. Any transcriptional errors are unintentional.   [name], NP 09/18/2023, 11:50 AM

## 2023-09-19 ENCOUNTER — Ambulatory Visit: Payer: Self-pay | Admitting: Licensed Clinical Social Worker

## 2023-09-19 NOTE — Patient Outreach (Signed)
  Care Coordination   Follow Up Visit Note   09/19/2023 Name: Shmuel Kedrowski. MRN: [id number] DOB: 1956-07-14  Darrence Siegman. is a 67 y.o. year old male who sees [name], NP for primary care. I spoke with  [name] by phone today.  What matters to the patients health and wellness today?  Follow up on mailed food resources     Goals Addressed             This Visit's Progress    Care Coordination Activities       Care Coordination Interventions: Patient is currently out of work and having some difficulties with buying food and will be going to DSS to apply for Food Stamps/SNAPS benefits later this week. Patient stated that if he does not return back to work soon that he may have some difficulties pay other bills SW educated the client on food resources in the community and will mail out some Materials engineer and educated the patient on the GGFF APP (Greater Dietitian).  SW will follow up with the patient in about 2 weeks.  Sw will mail the list out again         SDOH assessments and interventions completed:  Yes  SDOH Interventions Today    Flowsheet Row Most Recent Value  SDOH Interventions   Food Insecurity Interventions Other (Comment)  [Patient has not received teh resources Sw will mail out again]        Care Coordination Interventions:  Yes, provided  Interventions Today    Flowsheet Row Most Recent Value  General Interventions   General Interventions Discussed/Reviewed General Interventions Discussed, [employer] has not received the food pantry resources, SW will resend the list back out by mail]        Follow up plan: Follow up call scheduled for 10/03/2023 at 3:00 pm    Encounter Outcome:  Patient Visit Completed   Jeanie Cooks, PhD [facility], [facility] Social Worker Direct Dial: [phone]  Fax: [phone]

## 2023-09-19 NOTE — Patient Instructions (Signed)
Visit Information  Thank you for taking time to visit with me today. Please don't hesitate to contact me if I can be of assistance to you.   Following are the goals we discussed today:   Goals Addressed             This Visit's Progress    Care Coordination Activities       Care Coordination Interventions: Patient is currently out of work and having some difficulties with buying food and will be going to DSS to apply for Food Stamps/SNAPS benefits later this week. Patient stated that if he does not return back to work soon that he may have some difficulties pay other bills SW educated the client on food resources in the community and will mail out some Materials engineer and educated the patient on the GGFF APP (Greater Dietitian).  SW will follow up with the patient in about 2 weeks.  Sw will mail the list out again         Our next appointment is by telephone on 10/03/2023 at 3:00 pm  Please call the care guide team at [phone] if you need to cancel or reschedule your appointment.   If you are experiencing a Mental Health or Behavioral Health Crisis or need someone to talk to, please call the Suicide and Crisis Lifeline: 988 go to [facility] Urgent [facility] [address], [place] [phone]) call 911  Patient verbalizes understanding of instructions and care plan provided today and agrees to view in MyChart. Active MyChart status and patient understanding of how to access instructions and care plan via MyChart confirmed with patient.     Jeanie Cooks, PhD [facility], [facility] Social Worker Direct Dial: [phone]  Fax: [phone]

## 2023-09-21 ENCOUNTER — Ambulatory Visit: Payer: Medicare PPO | Admitting: Licensed Clinical Social Worker

## 2023-09-21 NOTE — Patient Instructions (Signed)
Visit Information  Thank you for taking time to visit with me today. Please don't hesitate to contact me if I can be of assistance to you.   Following are the goals we discussed today:   Goals Addressed             This Visit's Progress    COMPLETED: Care Coordination Activities       Care Coordination Interventions: Patient is currently out of work and having some difficulties with buying food and will be going to DSS to apply for Food Stamps/SNAPS benefits later this week. Patient stated that if he does not return back to work soon that he may have some difficulties pay other bills SW educated the client on food resources in the community and will mail out some Materials engineer and educated the patient on the GGFF APP (Greater Dietitian).  SW will follow up with the patient in about 2 weeks.  Sw will mail the list out again       No further follow up needed  Please call the care guide team at [phone] if you need to cancel or reschedule your appointment.   If you are experiencing a Mental Health or Behavioral Health Crisis or need someone to talk to, please call the Suicide and Crisis Lifeline: 988 go to [facility] Urgent [facility] [address] [place] [phone]) call 911  Patient verbalizes understanding of instructions and care plan provided today and agrees to view in MyChart. Active MyChart status and patient understanding of how to access instructions and care plan via MyChart confirmed with patient.     Jeanie Cooks, PhD [facility], [facility] Social Worker Direct Dial: [phone]  Fax: [phone]

## 2023-09-21 NOTE — Patient Outreach (Signed)
  Care Coordination   Follow Up Visit Note   09/21/2023 Name: Kevin Lambert. MRN: [id number] DOB: 03/29/1956  Kevin Lambert. is a 67 y.o. year old male who sees [name], NP for primary care. I spoke with  [name] by phone today.  What matters to the patients health and wellness today?  Follow on Food Resources that were mailed    Goals Addressed             This Visit's Progress    COMPLETED: Care Coordination Activities       Care Coordination Interventions: Patient is currently out of work and having some difficulties with buying food and will be going to DSS to apply for Food Stamps/SNAPS benefits later this week. Patient stated that if he does not return back to work soon that he may have some difficulties pay other bills SW educated the client on food resources in the community and will mail out some Materials engineer and educated the patient on the GGFF APP (Greater Dietitian).  SW will follow up with the patient in about 2 weeks.  Sw will mail the list out again         SDOH assessments and interventions completed:  Yes  SDOH Interventions Today    Flowsheet Row Most Recent Value  SDOH Interventions   Food Insecurity Interventions Intervention Not Indicated, Other (Comment)  [Patient called in to notify the SW that he received the resources in the mail]        Care Coordination Interventions:  Yes, provided  Interventions Today    Flowsheet Row Most Recent Value  General Interventions   General Interventions Discussed/Reviewed General Interventions Discussed, [employer] called in to notify the SW that he received the resources in the mail]        Follow up plan: No further intervention required.   Encounter Outcome:  Patient Visit Completed   Jeanie Cooks, PhD [facility], [facility] Social Worker Direct Dial: [phone]  Fax: [phone]

## 2023-10-03 ENCOUNTER — Encounter: Payer: Medicare PPO | Admitting: Licensed Clinical Social Worker

## 2023-10-13 ENCOUNTER — Inpatient Hospital Stay: Payer: Medicare PPO | Attending: Internal Medicine

## 2023-10-13 ENCOUNTER — Ambulatory Visit (HOSPITAL_COMMUNITY)
Admission: RE | Admit: 2023-10-13 | Discharge: 2023-10-13 | Disposition: A | Discharge status: Home / Self Care | Payer: Medicare PPO | Source: Ambulatory Visit | Attending: Internal Medicine | Admitting: Internal Medicine

## 2023-10-13 DIAGNOSIS — F17290 Nicotine dependence, other tobacco product, uncomplicated: Secondary | ICD-10-CM | POA: Insufficient documentation

## 2023-10-13 DIAGNOSIS — Z79899 Other long term (current) drug therapy: Secondary | ICD-10-CM | POA: Insufficient documentation

## 2023-10-13 DIAGNOSIS — Z95828 Presence of other vascular implants and grafts: Secondary | ICD-10-CM

## 2023-10-13 DIAGNOSIS — C3490 Malignant neoplasm of unspecified part of unspecified bronchus or lung: Secondary | ICD-10-CM

## 2023-10-13 DIAGNOSIS — C3491 Malignant neoplasm of unspecified part of right bronchus or lung: Secondary | ICD-10-CM

## 2023-10-13 DIAGNOSIS — C3411 Malignant neoplasm of upper lobe, right bronchus or lung: Secondary | ICD-10-CM | POA: Insufficient documentation

## 2023-10-13 LAB — CMP (CANCER CENTER ONLY)
ALT: 17 U/L (ref 0–44)
AST: 12 U/L — ABNORMAL LOW (ref 15–41)
Albumin: 3.7 g/dL (ref 3.5–5.0)
Alkaline Phosphatase: 68 U/L (ref 38–126)
Anion gap: 6 (ref 5–15)
BUN: 18 mg/dL (ref 8–23)
CO2: 28 mmol/L (ref 22–32)
Calcium: 9.6 mg/dL (ref 8.9–10.3)
Chloride: 102 mmol/L (ref 98–111)
Creatinine: 1.67 mg/dL — ABNORMAL HIGH (ref 0.61–1.24)
GFR, Estimated: 44 mL/min — ABNORMAL LOW (ref >60)
Glucose, Bld: 182 mg/dL — ABNORMAL HIGH (ref 70–99)
Potassium: 3.8 mmol/L (ref 3.5–5.1)
Sodium: 136 mmol/L (ref 135–145)
Total Bilirubin: 0.3 mg/dL (ref <1.2)
Total Protein: 6.3 g/dL — ABNORMAL LOW (ref 6.5–8.1)

## 2023-10-13 LAB — CBC WITH DIFFERENTIAL (CANCER CENTER ONLY)
Abs Immature Granulocytes: 0.05 10*3/uL (ref 0.00–0.07)
Basophils Absolute: 0 10*3/uL (ref 0.0–0.1)
Basophils Relative: 0 %
Eosinophils Absolute: 0.4 10*3/uL (ref 0.0–0.5)
Eosinophils Relative: 6 %
HCT: 39.9 % (ref 39.0–52.0)
Hemoglobin: 13.9 g/dL (ref 13.0–17.0)
Immature Granulocytes: 1 %
Lymphocytes Relative: 26 %
Lymphs Abs: 1.5 10*3/uL (ref 0.7–4.0)
MCH: 32.2 pg (ref 26.0–34.0)
MCHC: 34.8 g/dL (ref 30.0–36.0)
MCV: 92.4 fL (ref 80.0–100.0)
Monocytes Absolute: 0.6 10*3/uL (ref 0.1–1.0)
Monocytes Relative: 10 %
Neutro Abs: 3.4 10*3/uL (ref 1.7–7.7)
Neutrophils Relative %: 57 %
Platelet Count: 182 10*3/uL (ref 150–400)
RBC: 4.32 MIL/uL (ref 4.22–5.81)
RDW: 13.7 % (ref 11.5–15.5)
WBC Count: 6 10*3/uL (ref 4.0–10.5)
nRBC: 0 % (ref 0.0–0.2)

## 2023-10-13 MED ORDER — SODIUM CHLORIDE 0.9% FLUSH
10.0000 mL | Freq: Once | INTRAVENOUS | Status: AC
  Administered 2023-10-13: 10 mL

## 2023-10-17 ENCOUNTER — Inpatient Hospital Stay (HOSPITAL_BASED_OUTPATIENT_CLINIC_OR_DEPARTMENT_OTHER): Payer: Medicare PPO | Admitting: Internal Medicine

## 2023-10-17 VITALS — BP 160/80 | HR 89 | Temp 98.3°F | Resp 17 | Wt 241.1 lb

## 2023-10-17 DIAGNOSIS — C3490 Malignant neoplasm of unspecified part of unspecified bronchus or lung: Secondary | ICD-10-CM

## 2023-10-17 DIAGNOSIS — C3411 Malignant neoplasm of upper lobe, right bronchus or lung: Secondary | ICD-10-CM | POA: Diagnosis present

## 2023-10-17 DIAGNOSIS — Z79899 Other long term (current) drug therapy: Secondary | ICD-10-CM | POA: Diagnosis not present

## 2023-10-17 DIAGNOSIS — F17290 Nicotine dependence, other tobacco product, uncomplicated: Secondary | ICD-10-CM | POA: Diagnosis not present

## 2023-10-17 NOTE — Progress Notes (Signed)
[facility] [phone]   [phone]  OFFICE PROGRESS NOTE  [name], NP [id number] [place] [address]  DIAGNOSIS: Stage IV (T2b, N3, M1a) non-small cell lung cancer, adenocarcinoma presented with large right upper lobe lung mass in addition to mediastinal and right supraclavicular lymphadenopathy as well as bilateral pulmonary nodules diagnosed in February 2021.  MOLECULAR STUDY by [employer]:  KRASG12C, 4.3%, Binimetinib  [id number], 0.9%, Niraparib, Olaparib, Rucaparib,Talazoparib, Tazemetostat  [id number], 1.7%, None   PRIOR THERAPY: Systemic chemotherapy with carboplatin for AUC of 5, Alimta 500 mg/M2 and Keytruda 200 mg IV every 3 weeks.  First dose December 31, 2019.  Status post 31 cycles.  The last few cycles he has been treated with single agent Keytruda secondary to renal insufficiency.  This treatment was discontinued secondary to immunotherapy mediated colitis and diarrhea  CURRENT THERAPY: Observation.  INTERVAL HISTORY: Kevin Lambert. 67 y.o. male returns to the clinic today for follow-up visit.Discussed the use of AI scribe software for clinical note transcription with the patient, who gave verbal consent to proceed.  History of Present Illness   The patient, a 67 year old with a history of lung cancer, has been on observation for almost two years following completion of thirty-one cycles of Keytruda in December 2022. He reports feeling generally well, with no significant health complaints since the last consultation. However, he has noticed an increase in coughing, which is dry in nature with no expectoration. He also reports experiencing more shortness of breath than usual, but denies any associated chest pain or hemoptysis. There are no complaints of nausea, vomiting, diarrhea, or headaches. He mentions a possible need for correction of his eyeglasses, suggesting some changes in vision.  A recent CT scan  revealed an increase in size of a previously identified area in the right upper lobe of the lung, from 3.5 by 1.6 to 4.8 by 3.5. No other areas of disease progression or spread were identified. The patient continues to smoke cigars, despite previous advice to quit.         MEDICAL HISTORY: Past Medical History:  Diagnosis Date   Arthritis    Chronic kidney disease    Depression    DM (diabetes mellitus) (HCC)    type II   Dyslipidemia    Dyspnea    unable to walk much - he thinks it is from    GERD (gastroesophageal reflux disease)    Hepatitis    Hepatitis C- treated   History of kidney stones    HTN (hypertension)    Lung cancer (HCC)    Neuropathy    nscl ca dx'd 10/2019   OSA on CPAP    PTSD (post-traumatic stress disorder)     ALLERGIES:  is allergic to metformin and related, naproxen, and oxycontin [oxycodone].  MEDICATIONS:  Current Outpatient Medications  Medication Sig Dispense Refill   amLODipine (NORVASC) 10 MG tablet Take 1 tablet (10 mg total) by mouth daily. 90 tablet 1   Ascorbic Acid (VITAMIN C) 1000 MG tablet Take 1,000 mg by mouth daily. (Patient not taking: Reported on 07/05/2023)     aspirin EC 81 MG tablet Take 81 mg by mouth daily. (Patient not taking: Reported on 07/05/2023)     atorvastatin (LIPITOR) 10 MG tablet Take 1 tablet (10 mg total) by mouth daily. 90 tablet 1   blood glucose meter kit and supplies Dispense based on patient and insurance preference. Patient to check blood sugars daily,  prior to eating and also as needed (FOR ICD-10 E10.9, E11.9). 1 each 0   carvedilol (COREG) 3.125 MG tablet Take 1 tablet (3.125 mg total) by mouth 2 (two) times daily with a meal. 180 tablet 1   chlorthalidone (HYGROTON) 25 MG tablet Take 1 tablet (25 mg total) by mouth daily. 90 tablet 1   cholecalciferol (VITAMIN D3) 25 MCG (1000 UNIT) tablet Take 1,000 Units by mouth daily.     Cyanocobalamin (VITAMIN B 12 PO) Take 1 tablet by mouth daily.     cyclobenzaprine  (FLEXERIL) 10 MG tablet Take 1 tablet (10 mg total) by mouth at bedtime as needed for muscle spasms. 30 tablet 2   empagliflozin (JARDIANCE) 25 MG TABS tablet Take 1 tablet (25 mg total) by mouth daily before breakfast. 90 tablet 1   ketoconazole (NIZORAL) 2 % cream Apply 1 Application topically daily. 60 g 2   magic mouthwash SOLN Take 5 mLs by mouth 4 (four) times daily as needed for mouth pain. Swish and swallow or spit 240 mL 2   meclizine (ANTIVERT) 25 MG tablet TAKE ONE-HALF TABLET BY MOUTH THREE TIMES A DAY AS NEEDED FOR DIZZINESS 30 tablet 2   nicotine (NICODERM CQ - DOSED IN MG/24 HOURS) 21 mg/24hr patch Place 1 patch (21 mg total) onto the skin daily. (Patient not taking: Reported on 07/05/2023) 30 patch 2   omeprazole (PRILOSEC) 20 MG capsule Take 1 capsule (20 mg total) by mouth daily. 90 capsule 1   prochlorperazine (COMPAZINE) 10 MG tablet Take 1 tablet (10 mg total) by mouth every 6 (six) hours as needed. 30 tablet 2   Propylene Glycol (SYSTANE BALANCE OP) Place 1 drop into both eyes daily.     Semaglutide,0.25 or 0.5MG /DOS, 2 MG/3ML SOPN Inject 0.25 mg into the skin once a week. (Patient not taking: Reported on 07/05/2023) 3 mL 2   sildenafil (VIAGRA) 100 MG tablet TAKE ONE TABLET BY MOUTH AS INSTRUCTED (TAKE 1 HOUR PRIOR TO SEXUAL ACTIVITY *DO NOT EXCEED 1 DOSE PER 24 HOUR PERIOD*) 10 tablet 5   triamcinolone cream (KENALOG) 0.5 % Apply 1 Application topically 2 (two) times daily. 454 g 1   zinc gluconate 50 MG tablet Take 50 mg by mouth daily.     No current facility-administered medications for this visit.    SURGICAL HISTORY:  Past Surgical History:  Procedure Laterality Date   BIOPSY OF MEDIASTINAL MASS  12/10/2019   Procedure: BIOPSY OF MEDIASTINAL MASS;  Surgeon: Josephine Igo, DO;  Location: [facility];  Service: Pulmonary;;  right upper lobe   BRONCHIAL NEEDLE ASPIRATION BIOPSY  12/10/2019   Procedure: BRONCHIAL NEEDLE ASPIRATION BIOPSIES;  Surgeon: Josephine Igo,  DO;  Location: [facility];  Service: Pulmonary;;   COLONOSCOPY     IR IMAGING GUIDED PORT INSERTION  12/31/2019   kidney stone     URETERAL STENT PLACEMENT     Ureteral Stent Removed     VIDEO BRONCHOSCOPY WITH ENDOBRONCHIAL ULTRASOUND N/A 12/10/2019   Procedure: VIDEO BRONCHOSCOPY WITH ENDOBRONCHIAL ULTRASOUND;  Surgeon: Josephine Igo, DO;  Location: [facility];  Service: Pulmonary;  Laterality: N/A;    REVIEW OF SYSTEMS:  Constitutional: positive for fatigue Eyes: negative Ears, nose, mouth, throat, and face: negative Respiratory: positive for cough and dyspnea on exertion Cardiovascular: negative Gastrointestinal: negative Genitourinary:negative Integument/breast: negative Hematologic/lymphatic: negative Musculoskeletal:negative Neurological: negative Behavioral/Psych: negative Endocrine: negative Allergic/Immunologic: negative   PHYSICAL EXAMINATION: General appearance: alert, cooperative, and no distress Head: Normocephalic, without obvious abnormality, atraumatic  Neck: no adenopathy, no JVD, supple, symmetrical, trachea midline, and thyroid not enlarged, symmetric, no tenderness/mass/nodules Lymph nodes: Cervical, supraclavicular, and axillary nodes normal. Resp: clear to auscultation bilaterally Back: symmetric, no curvature. ROM normal. No CVA tenderness. Cardio: regular rate and rhythm, S1, S2 normal, no murmur, click, rub or gallop GI: soft, non-tender; bowel sounds normal; no masses,  no organomegaly Extremities: extremities normal, atraumatic, no cyanosis or edema Neurologic: Alert and oriented X 3, normal strength and tone. Normal symmetric reflexes. Normal coordination and gait  ECOG PERFORMANCE STATUS: 0 - Asymptomatic  Blood pressure (!) 160/80, pulse 89, temperature 98.3 F (36.8 C), temperature source Temporal, resp. rate 17, weight 241 lb 1.6 oz (109.4 kg), SpO2 98%.  LABORATORY DATA: Lab Results  Component Value Date   WBC 6.0 10/13/2023   HGB 13.9  10/13/2023   HCT 39.9 10/13/2023   MCV 92.4 10/13/2023   PLT 182 10/13/2023      Chemistry      Component Value Date/Time   NA 136 10/13/2023 1058   NA 137 07/12/2022 1115   K 3.8 10/13/2023 1058   CL 102 10/13/2023 1058   CO2 28 10/13/2023 1058   BUN 18 10/13/2023 1058   BUN 28 (H) 07/12/2022 1115   CREATININE 1.67 (H) 10/13/2023 1058      Component Value Date/Time   CALCIUM 9.6 10/13/2023 1058   ALKPHOS 68 10/13/2023 1058   AST 12 (L) 10/13/2023 1058   ALT 17 10/13/2023 1058   BILITOT 0.3 10/13/2023 1058       RADIOGRAPHIC STUDIES: CT Chest Wo Contrast  Result Date: 10/16/2023 CLINICAL DATA:  Lung cancer restaging * Tracking Code: BO * EXAM: CT CHEST, ABDOMEN AND PELVIS WITHOUT CONTRAST TECHNIQUE: Multidetector CT imaging of the chest, abdomen and pelvis was performed following the standard protocol without IV contrast. RADIATION DOSE REDUCTION: This exam was performed according to the departmental dose-optimization program which includes automated exposure control, adjustment of the mA and/or kV according to patient size and/or use of iterative reconstruction technique. COMPARISON:  04/17/2023 FINDINGS: CT CHEST FINDINGS Cardiovascular: Right chest port catheter. Aortic atherosclerosis. Normal heart size. Left and right coronary artery calcifications. No pericardial effusion. Mediastinum/Nodes: No enlarged mediastinal, hilar, or axillary lymph nodes. Thyroid gland, trachea, and esophagus demonstrate no significant findings. Lungs/Pleura: Significant interval enlargement of a treated mass in the posterior right upper lobe abutting the major fissure, measuring 4.8 x 3.5 cm, previously no greater than 3.5 x 1.6 cm when measured similarly (series 4, image 55). Diffuse bilateral bronchial wall thickening. No pleural effusion or pneumothorax. Musculoskeletal: No chest wall abnormality. No acute osseous findings. CT ABDOMEN PELVIS FINDINGS Hepatobiliary: No solid liver abnormality is seen.  No gallstones, gallbladder wall thickening, or biliary dilatation. Pancreas: Unremarkable. No pancreatic ductal dilatation or surrounding inflammatory changes. Spleen: Normal in size without significant abnormality. Adrenals/Urinary Tract: Unchanged definitively benign adrenal adenomata, for which no further follow-up or characterization is required. Small bilateral nonobstructive renal calculi and or renal vascular calcifications. No ureteral calculi or hydronephrosis. Simple, benign right renal cortical cysts, for which no further follow-up or characterization is required. Bladder is unremarkable. Stomach/Bowel: Stomach is within normal limits. Appendix appears normal. No evidence of bowel wall thickening, distention, or inflammatory changes. Vascular/Lymphatic: Severe aortic atherosclerosis. No enlarged abdominal or pelvic lymph nodes. Reproductive: No mass or other abnormality. Other: No abdominal wall hernia or abnormality. No ascites. Musculoskeletal: No acute osseous findings. IMPRESSION: 1. Significant interval enlargement of a treated mass in the posterior right upper lobe  abutting the major fissure, consistent with locally recurrent malignancy. 2. No noncontrast evidence of lymphadenopathy or metastatic disease in the chest, abdomen, or pelvis. 3. Small bilateral nonobstructive renal calculi and or renal vascular calcifications. No ureteral calculi or hydronephrosis. 4. Coronary artery disease. Aortic Atherosclerosis (ICD10-I70.0). Electronically Signed   By: Jearld Lesch M.D.   On: 10/16/2023 17:04   CT Abdomen Pelvis Wo Contrast  Result Date: 10/16/2023 CLINICAL DATA:  Lung cancer restaging * Tracking Code: BO * EXAM: CT CHEST, ABDOMEN AND PELVIS WITHOUT CONTRAST TECHNIQUE: Multidetector CT imaging of the chest, abdomen and pelvis was performed following the standard protocol without IV contrast. RADIATION DOSE REDUCTION: This exam was performed according to the departmental dose-optimization  program which includes automated exposure control, adjustment of the mA and/or kV according to patient size and/or use of iterative reconstruction technique. COMPARISON:  04/17/2023 FINDINGS: CT CHEST FINDINGS Cardiovascular: Right chest port catheter. Aortic atherosclerosis. Normal heart size. Left and right coronary artery calcifications. No pericardial effusion. Mediastinum/Nodes: No enlarged mediastinal, hilar, or axillary lymph nodes. Thyroid gland, trachea, and esophagus demonstrate no significant findings. Lungs/Pleura: Significant interval enlargement of a treated mass in the posterior right upper lobe abutting the major fissure, measuring 4.8 x 3.5 cm, previously no greater than 3.5 x 1.6 cm when measured similarly (series 4, image 55). Diffuse bilateral bronchial wall thickening. No pleural effusion or pneumothorax. Musculoskeletal: No chest wall abnormality. No acute osseous findings. CT ABDOMEN PELVIS FINDINGS Hepatobiliary: No solid liver abnormality is seen. No gallstones, gallbladder wall thickening, or biliary dilatation. Pancreas: Unremarkable. No pancreatic ductal dilatation or surrounding inflammatory changes. Spleen: Normal in size without significant abnormality. Adrenals/Urinary Tract: Unchanged definitively benign adrenal adenomata, for which no further follow-up or characterization is required. Small bilateral nonobstructive renal calculi and or renal vascular calcifications. No ureteral calculi or hydronephrosis. Simple, benign right renal cortical cysts, for which no further follow-up or characterization is required. Bladder is unremarkable. Stomach/Bowel: Stomach is within normal limits. Appendix appears normal. No evidence of bowel wall thickening, distention, or inflammatory changes. Vascular/Lymphatic: Severe aortic atherosclerosis. No enlarged abdominal or pelvic lymph nodes. Reproductive: No mass or other abnormality. Other: No abdominal wall hernia or abnormality. No ascites.  Musculoskeletal: No acute osseous findings. IMPRESSION: 1. Significant interval enlargement of a treated mass in the posterior right upper lobe abutting the major fissure, consistent with locally recurrent malignancy. 2. No noncontrast evidence of lymphadenopathy or metastatic disease in the chest, abdomen, or pelvis. 3. Small bilateral nonobstructive renal calculi and or renal vascular calcifications. No ureteral calculi or hydronephrosis. 4. Coronary artery disease. Aortic Atherosclerosis (ICD10-I70.0). Electronically Signed   By: Jearld Lesch M.D.   On: 10/16/2023 17:04     ASSESSMENT AND PLAN: This is a very pleasant 67 years old African-American male recently diagnosed with a stage IV non-small cell lung cancer, adenocarcinoma with positive KRAS G12C mutation presented with large right lower lobe lung mass in addition to mediastinal and right supraclavicular lymphadenopathy as well as bilateral pulmonary nodules diagnosed in February 2021. I explained to the patient that he has incurable condition and all the treatment options will be of palliative nature. The patient has no actionable mutations on the recent molecular studies and he is not a candidate for the [facility] clinical trial. He started systemic chemotherapy with carboplatin, Alimta and Keytruda status post 31 cycles.  Starting from cycle #5 the patient is on maintenance treatment with Alimta and Keytruda every 3 weeks.  He has been recently on single agent Keytruda for  several cycles secondary to renal insufficiency. His treatment was discontinued secondary to immunotherapy mediated colitis with diarrhea. He is currently on observation with no concerning complaints except for dry cough and shortness of breath with exertion. He had repeat CT scan of the chest, abdomen and pelvis performed recently.  I personally and independently reviewed the scan images and discussed the result with the patient and showed him the images today.  His scan  showed significant interval enlargement of a treated mass in the posterior right upper lobe abutting the major fissure consistent with locally recurrent malignancy.    Lung Cancer Progression Completed 31 cycles of Keytruda in December 2022. Recent CT scan shows an increase in size of a lesion in the right upper lobe from 3.5 x 1.6 cm to 4.8 x 3.5 cm. No other areas of disease progression noted. Reports increased dry cough and shortness of breath, denies chest pain, hemoptysis, nausea, vomiting, diarrhea, headaches, or changes in vision. Discussed the need for a PET scan to evaluate the lesion and check for other potential metastatic sites. If confirmed as cancerous, radiation therapy may be an option. - Order PET scan to evaluate the increased lesion size and check for other potential metastatic sites - Follow-up appointment after Christmas to discuss PET scan results and determine next steps, potentially involving radiation therapy  Smoking Cessation Continues to smoke cigars despite previous advice to quit. Smoking cessation is crucial to prevent further complications and disease progression. - Strongly advise smoking cessation - Provide resources and support for smoking cessation  Vision Issues Reports needing to correct his lenses, indicating potential vision issues. - Recommend an eye examination to update prescription lenses  Follow-up - Schedule follow-up appointment after Christmas to review PET scan results and plan further treatment.   The patient was advised to call immediately if he has any other concerning symptoms in the interval. The patient voices understanding of current disease status and treatment options and is in agreement with the current care plan.  All questions were answered. The patient knows to call the clinic with any problems, questions or concerns. We can certainly see the patient much sooner if necessary. The total time spent in the appointment was 30  minutes.  Disclaimer: This note was dictated with voice recognition software. Similar sounding words can inadvertently be transcribed and may not be corrected upon review.

## 2023-10-18 ENCOUNTER — Other Ambulatory Visit: Payer: Self-pay

## 2023-10-20 ENCOUNTER — Telehealth: Payer: Self-pay | Admitting: Medical Oncology

## 2023-10-20 NOTE — Telephone Encounter (Signed)
He received a letter this week that he needs to resume his oncology care at the  [facility] . He asked me to cancel his next appt ( port flush ) and I did . He will call central scheduling to cancel PET scan. He asked me to leave appt with Cassie in January and he is waiting on a call back with update if he can get  his scan here and see Cassie.

## 2023-10-23 ENCOUNTER — Telehealth: Payer: Self-pay | Admitting: Internal Medicine

## 2023-10-23 NOTE — Telephone Encounter (Signed)
Patient is aware of scheduled appointment times/dates for port flush only on 12/07/2023

## 2023-10-24 ENCOUNTER — Other Ambulatory Visit: Payer: Medicare PPO

## 2023-10-26 ENCOUNTER — Ambulatory Visit (HOSPITAL_COMMUNITY): Payer: Medicare PPO

## 2023-10-31 ENCOUNTER — Ambulatory Visit: Payer: No Typology Code available for payment source | Admitting: Podiatry

## 2023-11-06 ENCOUNTER — Other Ambulatory Visit (HOSPITAL_COMMUNITY): Payer: Self-pay | Admitting: Student

## 2023-11-06 ENCOUNTER — Ambulatory Visit: Payer: Medicare PPO | Admitting: Podiatry

## 2023-11-06 DIAGNOSIS — C3491 Malignant neoplasm of unspecified part of right bronchus or lung: Secondary | ICD-10-CM

## 2023-11-06 NOTE — Progress Notes (Signed)
Results of pt's molecular studies from [facility] in March 2021 faxed to Aileen Pilot, [facility].

## 2023-11-10 ENCOUNTER — Telehealth: Payer: Self-pay | Admitting: Physician Assistant

## 2023-11-10 NOTE — Progress Notes (Deleted)
 [facility] OFFICE PROGRESS NOTE  Kevin Rosaline SQUIBB, NP [address] [place] [address]  DIAGNOSIS: Stage IV (T2b, N3, M1a) non-small cell lung cancer, adenocarcinoma presented with large right upper lobe lung mass in addition to mediastinal and right supraclavicular lymphadenopathy as well as bilateral pulmonary nodules diagnosed in February 2021.   MOLECULAR STUDY by [employer]:   KRASG12C, 4.3%, Binimetinib   [id number], 0.9%, Niraparib, Olaparib, Rucaparib,Talazoparib, Tazemetostat   [id number], 1.7%, None          **Repeat molecular  PRIOR THERAPY: Systemic chemotherapy with carboplatin  for AUC of 5, Alimta  500 mg/M2 and Keytruda  200 mg IV every 3 weeks. First dose December 31, 2019. Status post 31 cycles. The last few cycles he has been treated with single agent Keytruda  secondary to renal insufficiency. 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 68 y.o. male returns to clinic today for follow-up visit.  He was last seen in the clinic by Dr. Sherrod on 10/17/2023.  In summary the patient has a history of stage IV lung cancer.  He underwent 31 cycles of treatment.  His treatment was discontinued in 2023 due to adverse side effects of treatment.  He has been on observation since that time and feeling fairly well.  At his last appointment, the patient had a restaging CT scan that showed increased size of the right upper lobe lesion.  Therefore Dr. Sherrod recommended a PET scan to further evaluate this.  The patient denies any major changes in his health since he was last seen.  He denies any fever, chills, night sweats, or unexplained weight loss.  Increased cough that is dry?  Shortness of breath?  Denies any chest pain or hemoptysis. He continues to smoke cigarettes.  Denies any nausea, vomiting, diarrhea, or constipation.  Denies any headaches.  He  mentions need for new glasses and vision changes.  He is here today to review his PET scan and have a more detailed discussion about his current condition and next steps in his care.   MEDICAL HISTORY: Past Medical History:  Diagnosis Date   Arthritis    Chronic kidney disease    Depression    DM (diabetes mellitus) (HCC)    type II   Dyslipidemia    Dyspnea    unable to walk much - he thinks it is from    GERD (gastroesophageal reflux disease)    Hepatitis    Hepatitis C- treated   History of kidney stones    HTN (hypertension)    Lung cancer (HCC)    Neuropathy    nscl ca dx'd 10/2019   OSA on CPAP    PTSD (post-traumatic stress disorder)     ALLERGIES:  is allergic to metformin and related, naproxen, and oxycontin  [oxycodone ].  MEDICATIONS:  Current Outpatient Medications  Medication Sig Dispense Refill   amLODipine  (NORVASC ) 10 MG tablet Take 1 tablet (10 mg total) by mouth daily. 90 tablet 1   Ascorbic Acid (VITAMIN C) 1000 MG tablet Take 1,000 mg by mouth daily. (Patient not taking: Reported on 07/05/2023)     aspirin EC 81 MG tablet Take 81 mg by mouth daily. (Patient not taking: Reported on 07/05/2023)     atorvastatin  (LIPITOR) 10 MG tablet Take 1 tablet (10 mg total) by mouth daily. 90 tablet 1   blood glucose meter kit and supplies Dispense based on patient and insurance preference. Patient  to check blood sugars daily, prior to eating and also as needed (FOR ICD-10 E10.9, E11.9). 1 each 0   carvedilol  (COREG ) 3.125 MG tablet Take 1 tablet (3.125 mg total) by mouth 2 (two) times daily with a meal. 180 tablet 1   chlorthalidone  (HYGROTON ) 25 MG tablet Take 1 tablet (25 mg total) by mouth daily. 90 tablet 1   cholecalciferol (VITAMIN D3) 25 MCG (1000 UNIT) tablet Take 1,000 Units by mouth daily.     Cyanocobalamin  (VITAMIN B 12 PO) Take 1 tablet by mouth daily.     cyclobenzaprine  (FLEXERIL ) 10 MG tablet Take 1 tablet (10 mg total) by mouth at bedtime as needed for  muscle spasms. 30 tablet 2   empagliflozin  (JARDIANCE ) 25 MG TABS tablet Take 1 tablet (25 mg total) by mouth daily before breakfast. 90 tablet 1   ketoconazole  (NIZORAL ) 2 % cream Apply 1 Application topically daily. 60 g 2   magic mouthwash SOLN Take 5 mLs by mouth 4 (four) times daily as needed for mouth pain. Swish and swallow or spit 240 mL 2   meclizine  (ANTIVERT ) 25 MG tablet TAKE ONE-HALF TABLET BY MOUTH THREE TIMES A DAY AS NEEDED FOR DIZZINESS 30 tablet 2   nicotine  (NICODERM CQ  - DOSED IN MG/24 HOURS) 21 mg/24hr patch Place 1 patch (21 mg total) onto the skin daily. (Patient not taking: Reported on 07/05/2023) 30 patch 2   omeprazole  (PRILOSEC) 20 MG capsule Take 1 capsule (20 mg total) by mouth daily. 90 capsule 1   prochlorperazine  (COMPAZINE ) 10 MG tablet Take 1 tablet (10 mg total) by mouth every 6 (six) hours as needed. 30 tablet 2   Propylene Glycol (SYSTANE BALANCE OP) Place 1 drop into both eyes daily.     Semaglutide ,0.25 or 0.5MG /DOS, 2 MG/3ML SOPN Inject 0.25 mg into the skin once a week. (Patient not taking: Reported on 07/05/2023) 3 mL 2   sildenafil  (VIAGRA ) 100 MG tablet TAKE ONE TABLET BY MOUTH AS INSTRUCTED (TAKE 1 HOUR PRIOR TO SEXUAL ACTIVITY *DO NOT EXCEED 1 DOSE PER 24 HOUR PERIOD*) 10 tablet 5   triamcinolone  cream (KENALOG ) 0.5 % Apply 1 Application topically 2 (two) times daily. 454 g 1   zinc gluconate 50 MG tablet Take 50 mg by mouth daily.     No current facility-administered medications for this visit.    SURGICAL HISTORY:  Past Surgical History:  Procedure Laterality Date   BIOPSY OF MEDIASTINAL MASS  12/10/2019   Procedure: BIOPSY OF MEDIASTINAL MASS;  Surgeon: Brenna Adine CROME, DO;  Location: [facility];  Service: Pulmonary;;  right upper lobe   BRONCHIAL NEEDLE ASPIRATION BIOPSY  12/10/2019   Procedure: BRONCHIAL NEEDLE ASPIRATION BIOPSIES;  Surgeon: Brenna Adine CROME, DO;  Location: [facility];  Service: Pulmonary;;   COLONOSCOPY     IR IMAGING GUIDED  PORT INSERTION  12/31/2019   kidney stone     URETERAL STENT PLACEMENT     Ureteral Stent Removed     VIDEO BRONCHOSCOPY WITH ENDOBRONCHIAL ULTRASOUND N/A 12/10/2019   Procedure: VIDEO BRONCHOSCOPY WITH ENDOBRONCHIAL ULTRASOUND;  Surgeon: Brenna Adine CROME, DO;  Location: [facility];  Service: Pulmonary;  Laterality: N/A;    REVIEW OF SYSTEMS:   Review of Systems  Constitutional: Negative for appetite change, chills, fatigue, fever and unexpected weight change.  HENT:   Negative for mouth sores, nosebleeds, sore throat and trouble swallowing.   Eyes: Negative for eye problems and icterus.  Respiratory: Negative for cough, hemoptysis, shortness of breath and wheezing.  Cardiovascular: Negative for chest pain and leg swelling.  Gastrointestinal: Negative for abdominal pain, constipation, diarrhea, nausea and vomiting.  Genitourinary: Negative for bladder incontinence, difficulty urinating, dysuria, frequency and hematuria.   Musculoskeletal: Negative for back pain, gait problem, neck pain and neck stiffness.  Skin: Negative for itching and rash.  Neurological: Negative for dizziness, extremity weakness, gait problem, headaches, light-headedness and seizures.  Hematological: Negative for adenopathy. Does not bruise/bleed easily.  Psychiatric/Behavioral: Negative for confusion, depression and sleep disturbance. The patient is not nervous/anxious.     PHYSICAL EXAMINATION:  There were no vitals taken for this visit.  ECOG PERFORMANCE STATUS: {CHL ONC ECOG [id number]  Physical Exam  Constitutional: Oriented to person, place, and time and well-developed, well-nourished, and in no distress. No distress.  HENT:  Head: Normocephalic and atraumatic.  Mouth/Throat: Oropharynx is clear and moist. No oropharyngeal exudate.  Eyes: Conjunctivae are normal. Right eye exhibits no discharge. Left eye exhibits no discharge. No scleral icterus.  Neck: Normal range of motion. Neck supple.   Cardiovascular: Normal rate, regular rhythm, normal heart sounds and intact distal pulses.   Pulmonary/Chest: Effort normal and breath sounds normal. No respiratory distress. No wheezes. No rales.  Abdominal: Soft. Bowel sounds are normal. Exhibits no distension and no mass. There is no tenderness.  Musculoskeletal: Normal range of motion. Exhibits no edema.  Lymphadenopathy:    No cervical adenopathy.  Neurological: Alert and oriented to person, place, and time. Exhibits normal muscle tone. Gait normal. Coordination normal.  Skin: Skin is warm and dry. No rash noted. Not diaphoretic. No erythema. No pallor.  Psychiatric: Mood, memory and judgment normal.  Vitals reviewed.  LABORATORY DATA: Lab Results  Component Value Date   WBC 6.0 10/13/2023   HGB 13.9 10/13/2023   HCT 39.9 10/13/2023   MCV 92.4 10/13/2023   PLT 182 10/13/2023      Chemistry      Component Value Date/Time   NA 136 10/13/2023 1058   NA 137 07/12/2022 1115   K 3.8 10/13/2023 1058   CL 102 10/13/2023 1058   CO2 28 10/13/2023 1058   BUN 18 10/13/2023 1058   BUN 28 (H) 07/12/2022 1115   CREATININE 1.67 (H) 10/13/2023 1058      Component Value Date/Time   CALCIUM  9.6 10/13/2023 1058   ALKPHOS 68 10/13/2023 1058   AST 12 (L) 10/13/2023 1058   ALT 17 10/13/2023 1058   BILITOT 0.3 10/13/2023 1058       RADIOGRAPHIC STUDIES:  CT Chest Wo Contrast Result Date: 10/16/2023 CLINICAL DATA:  Lung cancer restaging * Tracking Code: BO * EXAM: CT CHEST, ABDOMEN AND PELVIS WITHOUT CONTRAST TECHNIQUE: Multidetector CT imaging of the chest, abdomen and pelvis was performed following the standard protocol without IV contrast. RADIATION DOSE REDUCTION: This exam was performed according to the departmental dose-optimization program which includes automated exposure control, adjustment of the mA and/or kV according to patient size and/or use of iterative reconstruction technique. COMPARISON:  04/17/2023 FINDINGS: CT CHEST  FINDINGS Cardiovascular: Right chest port catheter. Aortic atherosclerosis. Normal heart size. Left and right coronary artery calcifications. No pericardial effusion. Mediastinum/Nodes: No enlarged mediastinal, hilar, or axillary lymph nodes. Thyroid  gland, trachea, and esophagus demonstrate no significant findings. Lungs/Pleura: Significant interval enlargement of a treated mass in the posterior right upper lobe abutting the major fissure, measuring 4.8 x 3.5 cm, previously no greater than 3.5 x 1.6 cm when measured similarly (series 4, image 55). Diffuse bilateral bronchial wall thickening. No pleural effusion or pneumothorax.  Musculoskeletal: No chest wall abnormality. No acute osseous findings. CT ABDOMEN PELVIS FINDINGS Hepatobiliary: No solid liver abnormality is seen. No gallstones, gallbladder wall thickening, or biliary dilatation. Pancreas: Unremarkable. No pancreatic ductal dilatation or surrounding inflammatory changes. Spleen: Normal in size without significant abnormality. Adrenals/Urinary Tract: Unchanged definitively benign adrenal adenomata, for which no further follow-up or characterization is required. Small bilateral nonobstructive renal calculi and or renal vascular calcifications. No ureteral calculi or hydronephrosis. Simple, benign right renal cortical cysts, for which no further follow-up or characterization is required. Bladder is unremarkable. Stomach/Bowel: Stomach is within normal limits. Appendix appears normal. No evidence of bowel wall thickening, distention, or inflammatory changes. Vascular/Lymphatic: Severe aortic atherosclerosis. No enlarged abdominal or pelvic lymph nodes. Reproductive: No mass or other abnormality. Other: No abdominal wall hernia or abnormality. No ascites. Musculoskeletal: No acute osseous findings. IMPRESSION: 1. Significant interval enlargement of a treated mass in the posterior right upper lobe abutting the major fissure, consistent with locally recurrent  malignancy. 2. No noncontrast evidence of lymphadenopathy or metastatic disease in the chest, abdomen, or pelvis. 3. Small bilateral nonobstructive renal calculi and or renal vascular calcifications. No ureteral calculi or hydronephrosis. 4. Coronary artery disease. Aortic Atherosclerosis (ICD10-I70.0). Electronically Signed   By: Marolyn JONETTA Jaksch M.D.   On: 10/16/2023 17:04   CT Abdomen Pelvis Wo Contrast Result Date: 10/16/2023 CLINICAL DATA:  Lung cancer restaging * Tracking Code: BO * EXAM: CT CHEST, ABDOMEN AND PELVIS WITHOUT CONTRAST TECHNIQUE: Multidetector CT imaging of the chest, abdomen and pelvis was performed following the standard protocol without IV contrast. RADIATION DOSE REDUCTION: This exam was performed according to the departmental dose-optimization program which includes automated exposure control, adjustment of the mA and/or kV according to patient size and/or use of iterative reconstruction technique. COMPARISON:  04/17/2023 FINDINGS: CT CHEST FINDINGS Cardiovascular: Right chest port catheter. Aortic atherosclerosis. Normal heart size. Left and right coronary artery calcifications. No pericardial effusion. Mediastinum/Nodes: No enlarged mediastinal, hilar, or axillary lymph nodes. Thyroid  gland, trachea, and esophagus demonstrate no significant findings. Lungs/Pleura: Significant interval enlargement of a treated mass in the posterior right upper lobe abutting the major fissure, measuring 4.8 x 3.5 cm, previously no greater than 3.5 x 1.6 cm when measured similarly (series 4, image 55). Diffuse bilateral bronchial wall thickening. No pleural effusion or pneumothorax. Musculoskeletal: No chest wall abnormality. No acute osseous findings. CT ABDOMEN PELVIS FINDINGS Hepatobiliary: No solid liver abnormality is seen. No gallstones, gallbladder wall thickening, or biliary dilatation. Pancreas: Unremarkable. No pancreatic ductal dilatation or surrounding inflammatory changes. Spleen: Normal in size  without significant abnormality. Adrenals/Urinary Tract: Unchanged definitively benign adrenal adenomata, for which no further follow-up or characterization is required. Small bilateral nonobstructive renal calculi and or renal vascular calcifications. No ureteral calculi or hydronephrosis. Simple, benign right renal cortical cysts, for which no further follow-up or characterization is required. Bladder is unremarkable. Stomach/Bowel: Stomach is within normal limits. Appendix appears normal. No evidence of bowel wall thickening, distention, or inflammatory changes. Vascular/Lymphatic: Severe aortic atherosclerosis. No enlarged abdominal or pelvic lymph nodes. Reproductive: No mass or other abnormality. Other: No abdominal wall hernia or abnormality. No ascites. Musculoskeletal: No acute osseous findings. IMPRESSION: 1. Significant interval enlargement of a treated mass in the posterior right upper lobe abutting the major fissure, consistent with locally recurrent malignancy. 2. No noncontrast evidence of lymphadenopathy or metastatic disease in the chest, abdomen, or pelvis. 3. Small bilateral nonobstructive renal calculi and or renal vascular calcifications. No ureteral calculi or hydronephrosis. 4. Coronary artery disease. Aortic  Atherosclerosis (ICD10-I70.0). Electronically Signed   By: Marolyn JONETTA Jaksch M.D.   On: 10/16/2023 17:04     ASSESSMENT/PLAN:  No problem-specific Assessment & Plan notes found for this encounter.   No orders of the defined types were placed in this encounter.    I spent {CHL ONC TIME VISIT - DTPQU:[id number]} counseling the patient face to face. The total time spent in the appointment was {CHL ONC TIME VISIT - DTPQU:[id number]}.  Emrick Hensch L Marqueze Ramcharan, PA-C 11/10/23

## 2023-11-10 NOTE — Telephone Encounter (Signed)
 I attempted to call the patient.  He is scheduled to see me on 1/7 to review PET scan results.  However, his PET scan is not scheduled till 11/17/2023.  Additionally, I see that the patient recently had a follow-up with hematology oncology over at the [facility] on 12/30.  I was asking for clarification if he was able to continue his care here at [facility].  Additionally if he is going to continue his care here at [facility], and I would like to reschedule his follow-up appointment for around 1/15 (after his PET scan).  I left our callback number.  Will try calling again next week.

## 2023-11-13 ENCOUNTER — Telehealth: Payer: Self-pay

## 2023-11-13 NOTE — Telephone Encounter (Signed)
 Spoke with patient in regards to appt with Cassie, PA on 1/7.  Patient stated that he got a letter from the [place] and they will be taking over his care. Appt with Cassie, PA is canceled.  Informed patient too give our office a call If he ever needs anything.

## 2023-11-14 ENCOUNTER — Ambulatory Visit: Payer: Medicare PPO | Admitting: Physician Assistant

## 2023-11-14 ENCOUNTER — Encounter: Payer: Self-pay | Admitting: Internal Medicine

## 2023-11-17 ENCOUNTER — Encounter: Payer: Self-pay | Admitting: Internal Medicine

## 2023-11-17 ENCOUNTER — Encounter (HOSPITAL_COMMUNITY)
Admission: RE | Admit: 2023-11-17 | Discharge: 2023-11-17 | Disposition: A | Discharge status: Home / Self Care | Payer: No Typology Code available for payment source | Source: Ambulatory Visit | Attending: Student | Admitting: Student

## 2023-11-17 DIAGNOSIS — C3491 Malignant neoplasm of unspecified part of right bronchus or lung: Secondary | ICD-10-CM | POA: Insufficient documentation

## 2023-11-17 LAB — GLUCOSE, CAPILLARY: Glucose-Capillary: 166 mg/dL — ABNORMAL HIGH (ref 70–99)

## 2023-11-17 MED ORDER — FLUDEOXYGLUCOSE F - 18 (FDG) INJECTION
12.0000 | Freq: Once | INTRAVENOUS | Status: AC
  Administered 2023-11-17: 11.96 via INTRAVENOUS

## 2023-11-20 ENCOUNTER — Ambulatory Visit: Payer: Medicare PPO | Admitting: Podiatry

## 2023-12-06 NOTE — Progress Notes (Incomplete)
Location of tumor and Histology per Pathology Report:   Biopsy: ***  Past/Anticipated interventions by surgeon, if any: {t:21944} ***  Past/Anticipated interventions by medical oncology, if any: Chemotherapy ***    Pain issues, if any:  {:18581} {PAIN DESCRIPTION:21022940}  SAFETY ISSUES: Prior radiation? {:18581} Pacemaker/ICD? {:18581} Possible current pregnancy? no Is the patient on methotrexate? {:18581}  Current Complaints / other details:  ***     ***

## 2023-12-07 ENCOUNTER — Telehealth: Payer: Self-pay | Admitting: Radiation Oncology

## 2023-12-07 ENCOUNTER — Ambulatory Visit: Payer: No Typology Code available for payment source

## 2023-12-07 ENCOUNTER — Ambulatory Visit: Payer: No Typology Code available for payment source | Admitting: Radiation Oncology

## 2023-12-08 ENCOUNTER — Inpatient Hospital Stay: Payer: Medicare PPO | Attending: Internal Medicine

## 2023-12-08 DIAGNOSIS — C3411 Malignant neoplasm of upper lobe, right bronchus or lung: Secondary | ICD-10-CM | POA: Diagnosis not present

## 2023-12-08 DIAGNOSIS — Z95828 Presence of other vascular implants and grafts: Secondary | ICD-10-CM

## 2023-12-08 DIAGNOSIS — Z452 Encounter for adjustment and management of vascular access device: Secondary | ICD-10-CM | POA: Insufficient documentation

## 2023-12-08 DIAGNOSIS — C3491 Malignant neoplasm of unspecified part of right bronchus or lung: Secondary | ICD-10-CM

## 2023-12-08 MED ORDER — HEPARIN SOD (PORK) LOCK FLUSH 100 UNIT/ML IV SOLN
500.0000 [IU] | Freq: Once | INTRAVENOUS | Status: AC
  Administered 2023-12-08: 500 [IU]

## 2023-12-08 MED ORDER — SODIUM CHLORIDE 0.9% FLUSH
10.0000 mL | Freq: Once | INTRAVENOUS | Status: AC
  Administered 2023-12-08: 10 mL

## 2023-12-11 ENCOUNTER — Encounter: Payer: Self-pay | Admitting: Radiation Oncology

## 2023-12-11 NOTE — Progress Notes (Signed)
 Thoracic Location of Tumor / Histology: Oligoprogression of RUL Mass  Patient presented as referral from Dr. Toribio Libra [facility] [place]).  11/17/2023 Dr. Daniel Margalski NM PET Image Restage (PS) Skull Base to Thigh CLINICAL DATA:  Subsequent treatment strategy for non-small cell lung cancer.  IMPRESSION: 1. Hypermetabolic masslike consolidation in the posterior segment right upper lobe, indicative of disease recurrence. No evidence of metastatic disease. 2. Aortic atherosclerosis (ICD10-I70.0). Coronary artery calcification. 3. Bilateral adrenal adenomas.   10/13/2023 Dr. Sherrod CT Chest without Contrast CLINICAL DATA: Lung cancer restaging * Tracking Code: BO *   IMPRESSION: 1. Significant interval enlargement of a treated mass in the posterior right upper lobe abutting the major fissure, consistent with locally recurrent malignancy. 2. No noncontrast evidence of lymphadenopathy or metastatic disease in the chest, abdomen, or pelvis. 3. Small bilateral nonobstructive renal calculi and or renal vascular calcifications. No ureteral calculi or hydronephrosis. 4. Coronary artery disease. Aortic Atherosclerosis (ICD10-I70.0).  04/17/2023 Dr. Sherrod CT Chest without Contrast CLINICAL DATA: Follow-up lung cancer   IMPRESSION: Radiation changes in the right upper lobe with stable treated tumor along the major fissure.  No evidence of recurrent or metastatic disease.  Additional stable ancillary findings as above.   Past/Anticipated interventions by cardiothoracic surgery, if any: NA  Past/Anticipated interventions by medical oncology, if any: NA   Tobacco/Marijuana/Snuff/ETOH use: Smoke some days.  Occasional alcohol use and no drug use.  Signs/Symptoms Weight changes, if any: No Respiratory complaints, if any:  Yes, SOB with exertion. Hemoptysis, if any: No Pain issues, if any:  0/10  SAFETY ISSUES: Prior radiation? No Pacemaker/ICD?  No Possible current  pregnancy? Male Is the patient on methotrexate? No  Current Complaints / other details:

## 2023-12-12 ENCOUNTER — Ambulatory Visit
Admission: RE | Admit: 2023-12-12 | Discharge: 2023-12-12 | Disposition: A | Discharge status: Home / Self Care | Payer: No Typology Code available for payment source | Source: Ambulatory Visit | Attending: Radiation Oncology | Admitting: Radiation Oncology

## 2023-12-12 ENCOUNTER — Ambulatory Visit: Payer: No Typology Code available for payment source | Admitting: Radiation Oncology

## 2023-12-12 ENCOUNTER — Telehealth (INDEPENDENT_AMBULATORY_CARE_PROVIDER_SITE_OTHER): Payer: Self-pay | Admitting: Primary Care

## 2023-12-12 ENCOUNTER — Encounter: Payer: Self-pay | Admitting: Radiation Oncology

## 2023-12-12 ENCOUNTER — Ambulatory Visit: Payer: No Typology Code available for payment source

## 2023-12-12 VITALS — BP 138/79 | HR 100 | Temp 97.1°F | Resp 18 | Ht 66.0 in | Wt 241.5 lb

## 2023-12-12 DIAGNOSIS — Z7982 Long term (current) use of aspirin: Secondary | ICD-10-CM | POA: Diagnosis not present

## 2023-12-12 DIAGNOSIS — K219 Gastro-esophageal reflux disease without esophagitis: Secondary | ICD-10-CM | POA: Diagnosis not present

## 2023-12-12 DIAGNOSIS — D3501 Benign neoplasm of right adrenal gland: Secondary | ICD-10-CM | POA: Insufficient documentation

## 2023-12-12 DIAGNOSIS — C3411 Malignant neoplasm of upper lobe, right bronchus or lung: Secondary | ICD-10-CM | POA: Insufficient documentation

## 2023-12-12 DIAGNOSIS — G629 Polyneuropathy, unspecified: Secondary | ICD-10-CM | POA: Insufficient documentation

## 2023-12-12 DIAGNOSIS — I700 Atherosclerosis of aorta: Secondary | ICD-10-CM | POA: Diagnosis not present

## 2023-12-12 DIAGNOSIS — F17210 Nicotine dependence, cigarettes, uncomplicated: Secondary | ICD-10-CM | POA: Diagnosis not present

## 2023-12-12 DIAGNOSIS — M129 Arthropathy, unspecified: Secondary | ICD-10-CM | POA: Insufficient documentation

## 2023-12-12 DIAGNOSIS — E785 Hyperlipidemia, unspecified: Secondary | ICD-10-CM | POA: Diagnosis not present

## 2023-12-12 DIAGNOSIS — C3491 Malignant neoplasm of unspecified part of right bronchus or lung: Secondary | ICD-10-CM

## 2023-12-12 DIAGNOSIS — D3502 Benign neoplasm of left adrenal gland: Secondary | ICD-10-CM | POA: Insufficient documentation

## 2023-12-12 DIAGNOSIS — Z7984 Long term (current) use of oral hypoglycemic drugs: Secondary | ICD-10-CM | POA: Insufficient documentation

## 2023-12-12 DIAGNOSIS — Z79899 Other long term (current) drug therapy: Secondary | ICD-10-CM | POA: Insufficient documentation

## 2023-12-12 DIAGNOSIS — N189 Chronic kidney disease, unspecified: Secondary | ICD-10-CM | POA: Insufficient documentation

## 2023-12-12 DIAGNOSIS — G4730 Sleep apnea, unspecified: Secondary | ICD-10-CM | POA: Insufficient documentation

## 2023-12-12 DIAGNOSIS — Z85118 Personal history of other malignant neoplasm of bronchus and lung: Secondary | ICD-10-CM | POA: Insufficient documentation

## 2023-12-12 DIAGNOSIS — E119 Type 2 diabetes mellitus without complications: Secondary | ICD-10-CM | POA: Diagnosis not present

## 2023-12-12 DIAGNOSIS — I10 Essential (primary) hypertension: Secondary | ICD-10-CM | POA: Insufficient documentation

## 2023-12-12 NOTE — Telephone Encounter (Signed)
 Called pt to remind them about atp. Pt will be present

## 2023-12-12 NOTE — Progress Notes (Signed)
 Radiation Oncology         [phone]) [phone] ________________________________  Initial outpatient Consultation  Name: Kevin Lambert. 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 Malignant neoplasm of right upper lobe of lung (HCC)  C34.11     2. Non-small cell cancer of right lung (HCC)  C34.91       HISTORY OF PRESENT ILLNESS: Kevin Oscar. is a 68 y.o. male seen at the request of Dr. Carollee of the [facility]. He has a history of stage IV NSCLC. In summary, he developed hemoptysis in 10/2019 and was found to have a RUL lung mass with local lymphadenopathy and scattered bilateral lung nodules. Bronchoscopy with biopsies on 12/10/19 under Dr. Brenna confirmed non-small cell carcinoma in the RUL and a lymph node. He was treated with systemic chemotherapy consisting of carboplatin , Alimta , and Keytruda  under Dr. Sherrod starting on 01/01/20. He completed 4 cycles of carboplatin  on 03/09/20 and continued with Alimta  and Keytruda . Alimta  was discontinued after 08/03/20 and Keytruda  was discontinued and all treatment was stopped after 31 cycles on 10/28/21. He has been on observation since that time, both under Dr. Sherrod and with the [facility].  He has undergone surveillance scans here at [facility] for convenience. He recently underwent restaging CT C/A/P on 10/13/23, which revealed significant interval enlargement of a treated mass in posterior RUL abutting major fissure. There was no evidence of adenopathy. He underwent further evaluation with PET scan on 11/17/23 showing: hypermetabolism to RUL, indicative of disease recurrence; no evidence of metastatic disease. The isolated lung recurrence measures 4.1 cm with no lymph node or metastatic disease.    He has reviewed the scan  results with Dr. Carollee and has been kindly referred to us  today for discussion of potential radiation therapy to the enlarging RUL mass.  PREVIOUS RADIATION THERAPY: No  PAST MEDICAL HISTORY:  Past Medical History:  Diagnosis Date   Arthritis    Chronic kidney disease    Depression    DM (diabetes mellitus) (HCC)    type II   Dyslipidemia    Dyspnea    unable to walk much - he thinks it is from    GERD (gastroesophageal reflux disease)    Hepatitis    Hepatitis C- treated   History of kidney stones    HTN (hypertension)    Lung cancer (HCC)    Neuropathy    nscl ca dx'd 10/2019   OSA on CPAP    PTSD (post-traumatic stress disorder)       PAST SURGICAL HISTORY: Past Surgical History:  Procedure Laterality Date   BIOPSY OF MEDIASTINAL MASS  12/10/2019   Procedure: BIOPSY OF MEDIASTINAL MASS;  Surgeon: Brenna Adine CROME, DO;  Location: [facility] ENDOSCOPY;  Service: Pulmonary;;  right upper lobe   BRONCHIAL NEEDLE ASPIRATION BIOPSY  12/10/2019   Procedure: BRONCHIAL NEEDLE ASPIRATION BIOPSIES;  Surgeon: Brenna Adine CROME, DO;  Location: [facility] ENDOSCOPY;  Service: Pulmonary;;   COLONOSCOPY     IR IMAGING GUIDED PORT INSERTION  12/31/2019   kidney stone     URETERAL STENT PLACEMENT     Ureteral Stent Removed     VIDEO BRONCHOSCOPY WITH ENDOBRONCHIAL ULTRASOUND N/A 12/10/2019   Procedure: VIDEO BRONCHOSCOPY WITH ENDOBRONCHIAL ULTRASOUND;  Surgeon:  Brenna Adine CROME, DO;  Location: [facility] ENDOSCOPY;  Service: Pulmonary;  Laterality: N/A;    FAMILY HISTORY:  Family History  Problem Relation Age of Onset   Liver disease Mother    Diabetes Father    Diabetes Brother    Esophageal cancer Neg Hx    Colon cancer Neg Hx    Stomach cancer Neg Hx    Pancreatic cancer Neg Hx     SOCIAL HISTORY:  Social History   Socioeconomic History   Marital status: Single    Spouse name: Not on file   Number of children: Not on file   Years of education: Not on file   Highest education level: Not on file   Occupational History   Occupation: Orthoptist    Employer: [id number]  Tobacco Use   Smoking status: Some Days    Types: Cigars    Passive exposure: Past   Smokeless tobacco: Never   Tobacco comments:    05/23/22. : 1-2 Black & Milds / LW  Vaping Use   Vaping status: Never Used  Substance and Sexual Activity   Alcohol use: Yes    Alcohol/week: 6.0 standard drinks of alcohol    Types: 6 Cans of beer per week    Comment: occ   Drug use: Never   Sexual activity: Yes    Birth control/protection: None  Other Topics Concern   Not on file  Social History Narrative   Not on file   Social Drivers of Health   Financial Resource Strain: Medium Risk (09/19/2023)   Overall Financial Resource Strain (CARDIA)    Difficulty of Paying Living Expenses: Somewhat hard  Food Insecurity: Food Insecurity Present (12/12/2023)   Hunger Vital Sign    Worried About Running Out of Food in the Last Year: Sometimes true    Ran Out of Food in the Last Year: Never true  Transportation Needs: Unmet Transportation Needs (12/12/2023)   PRAPARE - Transportation    Lack of Transportation (Medical): Yes    Lack of Transportation (Non-Medical): Yes  Physical Activity: Inactive (08/28/2023)   Exercise Vital Sign    Days of Exercise per Week: 0 days    Minutes of Exercise per Session: 0 min  Stress: No Stress Concern Present (09/19/2023)   [employer] of Occupational Health - Occupational Stress Questionnaire    Feeling of Stress : Not at all  Recent Concern: Stress - Stress Concern Present (08/28/2023)   [employer] of Occupational Health - Occupational Stress Questionnaire    Feeling of Stress : Rather much  Social Connections: Socially Isolated (08/28/2023)   Social Connection and Isolation Panel [NHANES]    Frequency of Communication with Friends and Family: More than three times a week    Frequency of Social Gatherings with Friends and Family: More than three times a week    Attends  Religious Services: Never    Database Administrator or Organizations: No    Attends Banker Meetings: Never    Marital Status: Never married  Intimate Partner Violence: Not At Risk (12/12/2023)   Humiliation, Afraid, Rape, and Kick questionnaire    Fear of Current or Ex-Partner: No    Emotionally Abused: No    Physically Abused: No    Sexually Abused: No    ALLERGIES: Metformin and related, Naproxen, and Oxycontin  [oxycodone ]  MEDICATIONS:  Current Outpatient Medications  Medication Sig Dispense Refill   amLODipine  (NORVASC ) 10 MG tablet Take 1 tablet (10 mg total) by mouth daily.  90 tablet 1   aspirin EC 81 MG tablet Take 81 mg by mouth daily.     atorvastatin  (LIPITOR) 10 MG tablet Take 1 tablet (10 mg total) by mouth daily. 90 tablet 1   carvedilol  (COREG ) 3.125 MG tablet Take 1 tablet (3.125 mg total) by mouth 2 (two) times daily with a meal. 180 tablet 1   chlorthalidone  (HYGROTON ) 25 MG tablet Take 1 tablet (25 mg total) by mouth daily. 90 tablet 1   cyclobenzaprine  (FLEXERIL ) 10 MG tablet Take 1 tablet (10 mg total) by mouth at bedtime as needed for muscle spasms. 30 tablet 2   diclofenac Sodium (VOLTAREN) 1 % GEL Apply topically.     empagliflozin  (JARDIANCE ) 25 MG TABS tablet Take 1 tablet (25 mg total) by mouth daily before breakfast. 90 tablet 1   nicotine  (NICODERM CQ  - DOSED IN MG/24 HOURS) 14 mg/24hr patch Place 14 mg onto the skin daily.     omeprazole  (PRILOSEC) 20 MG capsule Take 1 capsule (20 mg total) by mouth daily. 90 capsule 1   sildenafil  (VIAGRA ) 100 MG tablet TAKE ONE TABLET BY MOUTH AS INSTRUCTED (TAKE 1 HOUR PRIOR TO SEXUAL ACTIVITY *DO NOT EXCEED 1 DOSE PER 24 HOUR PERIOD*) 10 tablet 5   triamcinolone  cream (KENALOG ) 0.5 % Apply 1 Application topically 2 (two) times daily. 454 g 1   blood glucose meter kit and supplies Dispense based on patient and insurance preference. Patient to check blood sugars daily, prior to eating and also as needed (FOR  ICD-10 E10.9, E11.9). 1 each 0   cholecalciferol (VITAMIN D3) 25 MCG (1000 UNIT) tablet Take 1,000 Units by mouth daily. (Patient not taking: Reported on 12/12/2023)     Cyanocobalamin  (VITAMIN B 12 PO) Take 1 tablet by mouth daily. (Patient not taking: Reported on 12/12/2023)     prochlorperazine  (COMPAZINE ) 10 MG tablet Take 1 tablet (10 mg total) by mouth every 6 (six) hours as needed. (Patient not taking: Reported on 12/12/2023) 30 tablet 2   Propylene Glycol (SYSTANE BALANCE OP) Place 1 drop into both eyes daily. (Patient not taking: Reported on 12/12/2023)     Semaglutide ,0.25 or 0.5MG /DOS, 2 MG/3ML SOPN Inject 0.25 mg into the skin once a week. (Patient not taking: Reported on 07/05/2023) 3 mL 2   telmisartan (MICARDIS) 20 MG tablet Take by mouth. (Patient not taking: Reported on 12/12/2023)     zinc gluconate 50 MG tablet Take 50 mg by mouth daily. (Patient not taking: Reported on 12/12/2023)     No current facility-administered medications for this encounter.    REVIEW OF SYSTEMS:  On review of systems, the patient reports that he is doing well overall. He denies any chest pain, shortness of breath, cough, fevers, chills, night sweats, unintended weight changes. He denies any bowel or bladder disturbances, and denies abdominal pain, nausea or vomiting. He denies any new musculoskeletal or joint aches or pains. A complete review of systems is obtained and is otherwise negative.    PHYSICAL EXAM:  Wt Readings from Last 3 Encounters:  12/12/23 241 lb 8 oz (109.5 kg)  10/17/23 241 lb 1.6 oz (109.4 kg)  09/18/23 238 lb (108 kg)   Temp Readings from Last 3 Encounters:  12/12/23 (!) 97.1 F (36.2 C) (Temporal)  10/17/23 98.3 F (36.8 C) (Temporal)  07/08/23 98 F (36.7 C)   BP Readings from Last 3 Encounters:  12/12/23 138/79  10/17/23 (!) 160/80  09/18/23 (!) 145/83   Pulse Readings from Last 3 Encounters:  12/12/23 100  10/17/23 89  09/18/23 98    /10  In general this is a well  appearing man in no acute distress. He's alert and oriented x4 and appropriate throughout the examination. Cardiopulmonary assessment is negative for acute distress and he exhibits normal effort.     KPS = 100  100 - Normal; no complaints; no evidence of disease. 90   - Able to carry on normal activity; minor signs or symptoms of disease. 80   - Normal activity with effort; some signs or symptoms of disease. 90   - Cares for self; unable to carry on normal activity or to do active work. 60   - Requires occasional assistance, but is able to care for most of his personal needs. 50   - Requires considerable assistance and frequent medical care. 40   - Disabled; requires special care and assistance. 30   - Severely disabled; hospital admission is indicated although death not imminent. 20   - Very sick; hospital admission necessary; active supportive treatment necessary. 10   - Moribund; fatal processes progressing rapidly. 0     - Dead  Karnofsky DA, Abelmann WH, Craver LS and Burchenal [facility] [phone]) The use of the nitrogen mustards in the palliative treatment of carcinoma: with particular reference to bronchogenic carcinoma Cancer 1 634-56  LABORATORY DATA:  Lab Results  Component Value Date   WBC 6.0 10/13/2023   HGB 13.9 10/13/2023   HCT 39.9 10/13/2023   MCV 92.4 10/13/2023   PLT 182 10/13/2023   Lab Results  Component Value Date   NA 136 10/13/2023   K 3.8 10/13/2023   CL 102 10/13/2023   CO2 28 10/13/2023   Lab Results  Component Value Date   ALT 17 10/13/2023   AST 12 (L) 10/13/2023   ALKPHOS 68 10/13/2023   BILITOT 0.3 10/13/2023     RADIOGRAPHY: NM PET Image Restage (PS) Skull Base to Thigh (F-18 FDG) Result Date: 11/27/2023 CLINICAL DATA:  Subsequent treatment strategy for non-small cell lung cancer. EXAM: NUCLEAR MEDICINE PET SKULL BASE TO THIGH TECHNIQUE: 12.0 mCi F-18 FDG was injected intravenously. Full-ring PET imaging was performed from the skull base to thigh  after the radiotracer. CT data was obtained and used for attenuation correction and anatomic localization. Fasting blood glucose: 166 mg/dl COMPARISON:  CT chest abdomen pelvis 10/13/2023 and PET 12/09/2019. FINDINGS: Mediastinal blood pool activity: SUV max 3.5 Liver activity: SUV max NA NECK: Slight misregistration artifact.  No abnormal hypermetabolism. Incidental CT findings: Retention cyst or polyp in the left maxillary sinus. Minimal mucosal thickening in the right maxillary sinus. CHEST: Hypermetabolism within masslike consolidation in the posterior segment right upper lobe, SUV max 7.3. No additional abnormal hypermetabolism. Incidental CT findings: Right IJ Port-A-Cath terminates in the right atrium. Atherosclerotic calcification of the aorta and coronary arteries. Heart is at the upper limits of normal in size to mildly enlarged. No pericardial or pleural effusion. ABDOMEN/PELVIS: No abnormal hypermetabolism. Incidental CT findings: 2.6 cm right adrenal nodule measures 1.7 Hounsfield units and 1.9 cm left adrenal nodule, -1 Hounsfield units. No specific follow-up necessary. Small low-attenuation lesions in the kidneys. No specific follow-up necessary. Renal vascular calcifications bilaterally. Bladder wall thickening. Small bilateral inguinal hernias contain fat. SKELETON: No abnormal hypermetabolism. Scattered patchy uptake in the spine is likely degenerative in etiology. Incidental CT findings: Degenerative changes in the spine. IMPRESSION: 1. Hypermetabolic masslike consolidation in the posterior segment right upper lobe, indicative of disease recurrence. No evidence of metastatic disease.  2. Aortic atherosclerosis (ICD10-I70.0). Coronary artery calcification. 3. Bilateral adrenal adenomas. Electronically Signed   By: Newell Eke M.D.   On: 11/27/2023 15:22      IMPRESSION/PLAN: 1. 68 y.o. 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  He may benefit from SBRT to the active disease.  Today, we talked to the patient and family about the findings and workup thus far. We discussed the natural history of lung cancer and general treatment, highlighting the role of radiotherapy in the management. We discussed the available radiation techniques, and focused on the details and logistics of delivery. We reviewed the anticipated acute and late sequelae associated with radiation in this setting. The patient was encouraged to ask questions that were answered to his satisfaction.  At the end of our conversation, the patient would be interested in proceeding with SBRT to the RUL mass.  We will schedule CT simulation.  I personally spent 60 minutes in this encounter including chart review, reviewing radiological studies, meeting face-to-face with the patient, entering orders and completing documentation.   ------------------------------------------------   Donnice Barge, MD [facility]  Radiation Oncology Direct Dial: [phone]  Fax: [phone] [email or web address]  Skype  [email or web address]   This document serves as a record of services personally performed by Donnice Barge, MD. It was created on his behalf by Izetta Neither, a trained medical scribe. The creation of this record is based on the scribe's personal observations and the provider's statements to them. This document has been checked and approved by the attending provider.

## 2023-12-18 ENCOUNTER — Encounter: Payer: Self-pay | Admitting: Internal Medicine

## 2023-12-19 ENCOUNTER — Ambulatory Visit (INDEPENDENT_AMBULATORY_CARE_PROVIDER_SITE_OTHER): Payer: Medicare PPO | Admitting: Primary Care

## 2023-12-19 ENCOUNTER — Encounter (INDEPENDENT_AMBULATORY_CARE_PROVIDER_SITE_OTHER): Payer: Self-pay | Admitting: Primary Care

## 2023-12-19 VITALS — BP 150/94 | HR 76 | Resp 16 | Ht 67.5 in | Wt 246.4 lb

## 2023-12-19 DIAGNOSIS — E785 Hyperlipidemia, unspecified: Secondary | ICD-10-CM | POA: Diagnosis not present

## 2023-12-19 DIAGNOSIS — N1831 Chronic kidney disease, stage 3a: Secondary | ICD-10-CM

## 2023-12-19 DIAGNOSIS — F17210 Nicotine dependence, cigarettes, uncomplicated: Secondary | ICD-10-CM | POA: Diagnosis not present

## 2023-12-19 DIAGNOSIS — E669 Obesity, unspecified: Secondary | ICD-10-CM

## 2023-12-19 DIAGNOSIS — Z2821 Immunization not carried out because of patient refusal: Secondary | ICD-10-CM

## 2023-12-19 DIAGNOSIS — Z760 Encounter for issue of repeat prescription: Secondary | ICD-10-CM

## 2023-12-19 DIAGNOSIS — Z1159 Encounter for screening for other viral diseases: Secondary | ICD-10-CM

## 2023-12-19 DIAGNOSIS — E1165 Type 2 diabetes mellitus with hyperglycemia: Secondary | ICD-10-CM | POA: Diagnosis not present

## 2023-12-19 DIAGNOSIS — Z1211 Encounter for screening for malignant neoplasm of colon: Secondary | ICD-10-CM

## 2023-12-19 DIAGNOSIS — L259 Unspecified contact dermatitis, unspecified cause: Secondary | ICD-10-CM | POA: Diagnosis not present

## 2023-12-19 DIAGNOSIS — F331 Major depressive disorder, recurrent, moderate: Secondary | ICD-10-CM

## 2023-12-19 DIAGNOSIS — E1169 Type 2 diabetes mellitus with other specified complication: Secondary | ICD-10-CM

## 2023-12-19 DIAGNOSIS — I152 Hypertension secondary to endocrine disorders: Secondary | ICD-10-CM | POA: Diagnosis not present

## 2023-12-19 DIAGNOSIS — Z6837 Body mass index (BMI) 37.0-37.9, adult: Secondary | ICD-10-CM

## 2023-12-19 DIAGNOSIS — Z6838 Body mass index (BMI) 38.0-38.9, adult: Secondary | ICD-10-CM

## 2023-12-19 DIAGNOSIS — R69 Illness, unspecified: Secondary | ICD-10-CM

## 2023-12-19 DIAGNOSIS — E66812 Obesity, class 2: Secondary | ICD-10-CM

## 2023-12-19 DIAGNOSIS — Z7984 Long term (current) use of oral hypoglycemic drugs: Secondary | ICD-10-CM

## 2023-12-19 MED ORDER — AMLODIPINE BESYLATE 10 MG PO TABS: 10.0000 mg | ORAL_TABLET | Freq: Every day | ORAL | 1 refills | Status: DC

## 2023-12-19 MED ORDER — CARVEDILOL 3.125 MG PO TABS
3.1250 mg | ORAL_TABLET | Freq: Two times a day (BID) | ORAL | 1 refills | Status: DC
Start: 2023-12-19 — End: 2024-07-03

## 2023-12-19 MED ORDER — EMPAGLIFLOZIN 25 MG PO TABS: 25.0000 mg | ORAL_TABLET | Freq: Every day | ORAL | 1 refills | Status: DC

## 2023-12-19 MED ORDER — CHLORTHALIDONE 25 MG PO TABS
25.0000 mg | ORAL_TABLET | Freq: Every day | ORAL | 1 refills | Status: DC
Start: 2023-12-19 — End: 2024-07-03

## 2023-12-19 NOTE — Patient Instructions (Signed)
Contact Dermatitis Dermatitis is when your skin becomes red, sore, and swollen.  Contact dermatitis happens when your body reacts to something that touches the skin. There are 2 types: Irritant contact dermatitis. This is when something bothers your skin, like soap. Allergic contact dermatitis. This is when your skin touches something you are allergic to, like poison ivy. What are the causes? Irritant contact dermatitis may be caused by: Makeup. Soaps. Detergents. Bleaches. Acids. Metals, like nickel. Allergic contact dermatitis may be caused by: Plants. Chemicals. Jewelry. Latex. Medicines. Preservatives. These are things added to products to help them last longer. There may be some in your clothes. What increases the risk? Having a job where you have to be near things that bother your skin. Having asthma or eczema. What are the signs or symptoms?  Dry or flaky skin. Redness. Cracks. Itching. Moderate symptoms of this condition include: Pain or a burning feeling. Blisters. Blood or clear fluid coming from cracks in your skin. Swelling. This may be on your eyelids, mouth, or genitals. How is this treated? Your doctor will find out what is making your skin react. Then, you can protect your skin. You may need to use: Steroid creams, ointments, or medicines. Antibiotics or other ointments, if you have a skin infection. Lotion or medicines to help with itching. A bandage. Follow these instructions at home: Skin care Put moisturizer on your skin when it needs it. Put cool, wet cloths on your skin (cool compresses). Put a baking soda paste on your skin. Stir water into baking soda until it looks like a paste. Do not scratch your skin. Try not to have things rub up against your skin. Avoid tight clothing. Avoid using soaps, perfumes, and dyes. Check your skin every day for signs of infection. Check for: More redness, swelling, or pain. More fluid or blood. Warmth. Pus or  a bad smell. Medicines Take or apply over-the-counter and prescription medicines only as told by your doctor. If you were prescribed antibiotics, take or apply them as told by your doctor. Do not stop using them even if you start to feel better. Bathing Take a bath with: Epsom salts. Baking soda. Colloidal oatmeal. Bathe less often. Bathe in warm water. Try not to use hot water. Bandage care If you were given a bandage, change it as told by your doctor. Wash your hands with soap and water for at least 20 seconds before and after you change your bandage. If you cannot use soap and water, use hand sanitizer. General instructions Avoid the things that caused your reaction. If you don't know what caused it, keep a journal. Write down: What you eat. What skin products you use. What you drink. What you wear. Contact a doctor if: You do not get better with treatment. You get worse. You have signs of infection. You have a fever. You have new symptoms. Your bone or joint near the area hurts after the skin has healed. Get help right away if: You see red streaks coming from the area. The area turns darker. You have trouble breathing. This information is not intended to replace advice given to you by your health care provider. Make sure you discuss any questions you have with your health care provider. Document Revised: 04/29/2022 Document Reviewed: 04/29/2022 Elsevier Patient Education  2024 [employer].

## 2023-12-19 NOTE — Progress Notes (Addendum)
[facility]  Kevin Lambert, is a 68 y.o. male  YNW:[id number]  MVH:[id number]  DOB - [birth year]  Chief Complaint  Patient presents with   Diabetes   Hypertension   Hyperlipidemia   Rash    Location: butt Comes and goes Has used keno cortisone cream(its a moisturizer)       Subjective:   Kevin Lambert is a 68 y.o. obese male here today for a follow up visit for the management of hypertension patient has No headache, No chest pain, No abdominal pain - No Nausea, No new weakness tingling or numbness, No Cough - shortness of breath.  He also did not take his medication wanting to know what his blood pressure would be off of therefore  today his BP is elevated which is an indication he needs to take his medication daily.  He also has type 2 diabetes Denies polyuria, polydipsia, or vision changes.  He has polyphasia . He does not check blood sugars at home.  Discussed risk factors African-American obese male with comorbidities increased risk for stroke and heart attack. He continues to smoke.  He states he is not focused enough to try the patches but there are other options such as gum and medication. He has always worked and now he is not which is causing depression.  Questioned if he had thoughts of harming his self of others.  Stated no.  Denies auditory and visual hallucinations.  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  This cancer has been dormant for the last 2 years.  He is followed by oncology Dr. Shirline Frees.  His blood pressure on December 12, 2023 was 138/79 on blood pressure medication. No problems updated.  Comprehensive ROS Pertinent positive and negative noted in HPI   Allergies  Allergen Reactions   Metformin And Related Other (See Comments)    Severe muscle spasms   Naproxen Other (See Comments)    Pt states it have him off balance.   Oxycontin [Oxycodone] Hives    Past Medical History:   Diagnosis Date   Arthritis    Chronic kidney disease    Depression    DM (diabetes mellitus) (HCC)    type II   Dyslipidemia    Dyspnea    unable to walk much - he thinks it is from    GERD (gastroesophageal reflux disease)    Hepatitis    Hepatitis C- treated   History of kidney stones    HTN (hypertension)    Lung cancer (HCC)    Neuropathy    nscl ca dx'd 10/2019   OSA on CPAP    PTSD (post-traumatic stress disorder)     Current Outpatient Medications on File Prior to Visit  Medication Sig Dispense Refill   amLODipine (NORVASC) 10 MG tablet Take 1 tablet (10 mg total) by mouth daily. 90 tablet 1   aspirin EC 81 MG tablet Take 81 mg by mouth daily.     atorvastatin (LIPITOR) 10 MG tablet Take 1 tablet (10 mg total) by mouth daily. 90 tablet 1   blood glucose meter kit and supplies Dispense based on patient and insurance preference. Patient to check blood sugars daily, prior to eating and also as needed (FOR ICD-10 E10.9, E11.9). 1 each 0   carvedilol (COREG) 3.125 MG tablet Take 1 tablet (3.125 mg total) by mouth 2 (two) times daily with a meal. 180 tablet 1   chlorthalidone (HYGROTON) 25  MG tablet Take 1 tablet (25 mg total) by mouth daily. 90 tablet 1   cholecalciferol (VITAMIN D3) 25 MCG (1000 UNIT) tablet Take 1,000 Units by mouth daily. (Patient not taking: Reported on 12/12/2023)     Cyanocobalamin (VITAMIN B 12 PO) Take 1 tablet by mouth daily. (Patient not taking: Reported on 12/12/2023)     cyclobenzaprine (FLEXERIL) 10 MG tablet Take 1 tablet (10 mg total) by mouth at bedtime as needed for muscle spasms. 30 tablet 2   diclofenac Sodium (VOLTAREN) 1 % GEL Apply topically.     empagliflozin (JARDIANCE) 25 MG TABS tablet Take 1 tablet (25 mg total) by mouth daily before breakfast. 90 tablet 1   nicotine (NICODERM CQ - DOSED IN MG/24 HOURS) 14 mg/24hr patch Place 14 mg onto the skin daily.     omeprazole (PRILOSEC) 20 MG capsule Take 1 capsule (20 mg total) by mouth daily. 90  capsule 1   prochlorperazine (COMPAZINE) 10 MG tablet Take 1 tablet (10 mg total) by mouth every 6 (six) hours as needed. (Patient not taking: Reported on 12/19/2023) 30 tablet 2   Propylene Glycol (SYSTANE BALANCE OP) Place 1 drop into both eyes daily. (Patient not taking: Reported on 12/12/2023)     Semaglutide,0.25 or 0.5MG /DOS, 2 MG/3ML SOPN Inject 0.25 mg into the skin once a week. (Patient not taking: Reported on 12/19/2023) 3 mL 2   sildenafil (VIAGRA) 100 MG tablet TAKE ONE TABLET BY MOUTH AS INSTRUCTED (TAKE 1 HOUR PRIOR TO SEXUAL ACTIVITY *DO NOT EXCEED 1 DOSE PER 24 HOUR PERIOD*) 10 tablet 5   telmisartan (MICARDIS) 20 MG tablet Take by mouth. (Patient not taking: Reported on 12/19/2023)     triamcinolone cream (KENALOG) 0.5 % Apply 1 Application topically 2 (two) times daily. 454 g 1   zinc gluconate 50 MG tablet Take 50 mg by mouth daily. (Patient not taking: Reported on 12/12/2023)     No current facility-administered medications on file prior to visit.   Health Maintenance  Topic Date Due   Eye exam for diabetics  Never done   Hepatitis C Screening  Never done   Colon Cancer Screening  Never done   COVID-19 Vaccine (3 - Pfizer risk series) 01/28/2021   Hemoglobin A1C  10/21/2023   Yearly kidney health urinalysis for diabetes  12/01/2023   Flu Shot  02/05/2024*   Pneumonia Vaccine (1 of 2 - PCV) 12/18/2024*   Complete foot exam   12/21/2023   Medicare Annual Wellness Visit  08/27/2024   Yearly kidney function blood test for diabetes  10/12/2024   DTaP/Tdap/Td vaccine (2 - Td or Tdap) 07/14/2027   Zoster (Shingles) Vaccine  Completed   HPV Vaccine  Aged Out  *Topic was postponed. The date shown is not the original due date.    Objective:   Vitals:   12/19/23 1351 12/19/23 1354  BP: (!) 162/90 (!) 165/89  Pulse: 76   Resp: 16   SpO2: 100%   Weight: 246 lb 6.4 oz (111.8 kg)   Height: 5' 7.5" (1.715 m)    BP Readings from Last 3 Encounters:  12/19/23 (!) 165/89  12/12/23  138/79  10/17/23 (!) 160/80   Physical Exam Vitals reviewed.  Constitutional:      Appearance: He is obese.  HENT:     Head: Normocephalic.     Right Ear: Tympanic membrane and external ear normal.     Left Ear: Tympanic membrane and external ear normal.     Nose: Nose  normal.  Eyes:     Extraocular Movements: Extraocular movements intact.  Cardiovascular:     Rate and Rhythm: Normal rate and regular rhythm.  Pulmonary:     Effort: Pulmonary effort is normal.     Breath sounds: Normal breath sounds.  Abdominal:     General: Bowel sounds are normal. There is distension.     Palpations: Abdomen is soft.  Musculoskeletal:        General: Normal range of motion.  Skin:    General: Skin is warm.     Findings: Rash present.     Comments: Folds of buttocks bumps  Neurological:     Mental Status: He is alert and oriented to person, place, and time.  Psychiatric:        Mood and Affect: Mood normal.        Behavior: Behavior normal.        Thought Content: Thought content normal.        Judgment: Judgment normal.    Assessment & Plan  Rondarius was seen today for diabetes, hypertension, hyperlipidemia and rash.  Diagnoses and all orders for this visit:  Pneumococcal vaccination declined  Body mass index (BMI) of 37.0-37.9 in adult  Cigarette nicotine dependence without complication - I have recommended complete cessation of tobacco use. I have discussed various options available for assistance with tobacco cessation including over the counter methods (Nicotine gum, patch and lozenges). We also discussed prescription options (Chantix, Nicotine Inhaler / Nasal Spray). The patient is not interested in pursuing any prescription tobacco cessation options at this time. - Patient declines at this time.  - Less than 5 minutes spent on counseling.   Type 2 diabetes mellitus with hyperglycemia, without long-term current use of insulin (HCC) - educated on lifestyle modifications,  including but not limited to diet choices and adding exercise to daily routine.   -     Hemoglobin A1c -     Microalbumin / creatinine urine ratio  Hyperlipidemia associated with type 2 diabetes mellitus (HCC) -     Lipid panel  Hypertension associated with type 2 diabetes mellitus (HCC) BP goal - < 140/90 Explained that having normal blood pressure is the goal and medications are helping to get to goal and maintain normal blood pressure. DIET: Limit salt intake, read nutrition labels to check salt content, limit fried and high fatty foods  Avoid using multisymptom OTC cold preparations that generally contain sudafed which can rise BP. 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 Stopped using current ones and return to previous brand  Patient have been counseled extensively about nutrition and exercise. Other issues discussed during this visit include: low cholesterol diet, weight control and daily exercise, foot care, annual eye examinations at Ophthalmology, importance of adherence with medications and regular follow-up. We also  discussed long term complications of uncontrolled diabetes and hypertension.   Return in about 3 months (around 03/17/2024).  The patient was given clear instructions to go to ER or return to medical center if symptoms don't improve, worsen or new problems  develop. The patient verbalized understanding. The patient was told to call to get lab results if they haven't heard anything in the next week.   This note has been created with Education officer, environmental. Any transcriptional errors are unintentional.   [name], NP 12/19/2023, 2:01 PM

## 2023-12-20 ENCOUNTER — Other Ambulatory Visit (INDEPENDENT_AMBULATORY_CARE_PROVIDER_SITE_OTHER): Payer: Self-pay | Admitting: Primary Care

## 2023-12-20 DIAGNOSIS — R768 Other specified abnormal immunological findings in serum: Secondary | ICD-10-CM

## 2023-12-20 LAB — HCV RT-PCR, QUANT (NON-GRAPH)

## 2023-12-21 ENCOUNTER — Ambulatory Visit
Admission: RE | Admit: 2023-12-21 | Discharge: 2023-12-21 | Disposition: A | Discharge status: Home / Self Care | Payer: No Typology Code available for payment source | Source: Ambulatory Visit | Attending: Radiation Oncology | Admitting: Radiation Oncology

## 2023-12-21 DIAGNOSIS — Z510 Encounter for antineoplastic radiation therapy: Secondary | ICD-10-CM | POA: Diagnosis present

## 2023-12-21 DIAGNOSIS — C3411 Malignant neoplasm of upper lobe, right bronchus or lung: Secondary | ICD-10-CM | POA: Diagnosis present

## 2023-12-21 DIAGNOSIS — C3491 Malignant neoplasm of unspecified part of right bronchus or lung: Secondary | ICD-10-CM

## 2023-12-22 LAB — CMP14+EGFR
ALT: 18 [IU]/L (ref 0–44)
AST: 10 [IU]/L (ref 0–40)
Albumin: 4 g/dL (ref 3.9–4.9)
Alkaline Phosphatase: 88 [IU]/L (ref 44–121)
BUN/Creatinine Ratio: 12 (ref 10–24)
BUN: 20 mg/dL (ref 8–27)
Bilirubin Total: <0.2 mg/dL (ref 0.0–1.2)
CO2: 22 mmol/L (ref 20–29)
Calcium: 9.6 mg/dL (ref 8.6–10.2)
Chloride: 98 mmol/L (ref 96–106)
Creatinine, Ser: 1.66 mg/dL — ABNORMAL HIGH (ref 0.76–1.27)
Globulin, Total: 2.5 g/dL (ref 1.5–4.5)
Glucose: 240 mg/dL — ABNORMAL HIGH (ref 70–99)
Potassium: 4.5 mmol/L (ref 3.5–5.2)
Sodium: 132 mmol/L — ABNORMAL LOW (ref 134–144)
Total Protein: 6.5 g/dL (ref 6.0–8.5)
eGFR: 45 mL/min/{1.73_m2} — ABNORMAL LOW (ref >59)

## 2023-12-22 LAB — LIPID PANEL
Chol/HDL Ratio: 5.4 {ratio} — ABNORMAL HIGH (ref 0.0–5.0)
Cholesterol, Total: 177 mg/dL (ref 100–199)
HDL: 33 mg/dL — ABNORMAL LOW (ref >39)
LDL Chol Calc (NIH): 89 mg/dL (ref 0–99)
Triglycerides: 335 mg/dL — ABNORMAL HIGH (ref 0–149)
VLDL Cholesterol Cal: 55 mg/dL — ABNORMAL HIGH (ref 5–40)

## 2023-12-22 LAB — HCV RT-PCR, QUANT (NON-GRAPH)

## 2023-12-22 LAB — MICROALBUMIN / CREATININE URINE RATIO
Creatinine, Urine: 91.2 mg/dL
Microalb/Creat Ratio: 217 mg/g{creat} — ABNORMAL HIGH (ref 0–29)
Microalbumin, Urine: 198.1 ug/mL

## 2023-12-22 LAB — HEMOGLOBIN A1C
Est. average glucose Bld gHb Est-mCnc: 214 mg/dL
Hgb A1c MFr Bld: 9.1 % — ABNORMAL HIGH (ref 4.8–5.6)

## 2023-12-22 LAB — HCV AB W REFLEX TO QUANT PCR: HCV Ab: REACTIVE — AB

## 2023-12-24 NOTE — Progress Notes (Signed)
  Radiation Oncology         [phone]) [phone] ________________________________  Name: Kevin Lambert. MRN: [id number]  Date: 12/21/2023  DOB: January 16, 1956  STEREOTACTIC BODY RADIOTHERAPY SIMULATION AND TREATMENT PLANNING NOTE    ICD-10-CM   1. Non-small cell cancer of right lung (HCC)  C34.91       DIAGNOSIS:   68 yo man with recurrent rcT2b cN0 cM0 adenocarcinoma of the right upper lung - Recurrent Stage IIA  NARRATIVE:  The patient was brought to the CT Simulation planning suite.  Identity was confirmed.  All relevant records and images related to the planned course of therapy were reviewed.  The patient freely provided informed written consent to proceed with treatment after reviewing the details related to the planned course of therapy. The consent form was witnessed and verified by the simulation staff.  Then, the patient was set-up in a stable reproducible  supine position for radiation therapy.  A BodyFix immobilization pillow was fabricated for reproducible positioning.  Then I personally applied the abdominal compression paddle to limit respiratory excursion.  4D respiratoy motion management CT images were obtained.  Surface markings were placed.  The CT images were loaded into the planning software.  Then, using Cine, MIP, and standard views, the internal target volume (ITV) and planning target volumes (PTV) were delinieated, and avoidance structures were contoured.  Treatment planning then occurred.  The radiation prescription was entered and confirmed.  A total of two complex treatment devices were fabricated in the form of the BodyFix immobilization pillow and a neck accuform cushion.  I have requested : 3D Simulation  I have requested a DVH of the following structures: Heart, Lungs, Esophagus, Chest Wall, Brachial Plexus, Major Blood Vessels, and targets.  SPECIAL TREATMENT PROCEDURE:  The planned course of therapy using radiation constitutes a special treatment procedure. Special care is  required in the management of this patient for the following reasons. This treatment constitutes a Special Treatment Procedure for the following reason: [ High dose per fraction requiring special monitoring for increased toxicities of treatment including daily imaging..  The special nature of the planned course of radiotherapy will require increased physician supervision and oversight to ensure patient's safety with optimal treatment outcomes.  This requires extended time and effort.    RESPIRATORY MOTION MANAGEMENT SIMULATION:  In order to account for effect of respiratory motion on target structures and other organs in the planning and delivery of radiotherapy, this patient underwent respiratory motion management simulation.  To accomplish this, when the patient was brought to the CT simulation planning suite, 4D respiratoy motion management CT images were obtained.  The CT images were loaded into the planning software.  Then, using a variety of tools including Cine, MIP, and standard views, the target volume and planning target volumes (PTV) were delineated.  Avoidance structures were contoured.  Treatment planning then occurred.  Dose volume histograms were generated and reviewed for each of the requested structure.  The resulting plan was carefully reviewed and approved today.  PLAN:  The patient will receive 60 Gy in 5 fractions.  ________________________________  Artist Pais Kathrynn Running, M.D.

## 2023-12-25 ENCOUNTER — Ambulatory Visit (INDEPENDENT_AMBULATORY_CARE_PROVIDER_SITE_OTHER): Payer: Medicare PPO | Admitting: Podiatry

## 2023-12-25 ENCOUNTER — Telehealth: Payer: Self-pay

## 2023-12-25 DIAGNOSIS — Z91199 Patient's noncompliance with other medical treatment and regimen due to unspecified reason: Secondary | ICD-10-CM

## 2023-12-25 NOTE — Telephone Encounter (Signed)
Copied from CRM [id number]. Topic: Referral - Question >> Dec 21, 2023  9:38 AM Patsy Lager T wrote: Reason for CRM: patient called stated he needs nurse to call him back as the referral for [facility] [address] [facility] [phone], [phone] needs approval from the [place] since he is a veteran

## 2023-12-25 NOTE — Progress Notes (Signed)
 No show

## 2023-12-25 NOTE — Telephone Encounter (Signed)
Kevin Lambert will we need to call [facility] or can you? Not sure how the process for PA on referrals work.

## 2023-12-26 ENCOUNTER — Encounter (INDEPENDENT_AMBULATORY_CARE_PROVIDER_SITE_OTHER): Payer: Self-pay | Admitting: Primary Care

## 2023-12-26 ENCOUNTER — Other Ambulatory Visit (INDEPENDENT_AMBULATORY_CARE_PROVIDER_SITE_OTHER): Payer: Self-pay | Admitting: Primary Care

## 2023-12-26 DIAGNOSIS — Z794 Long term (current) use of insulin: Secondary | ICD-10-CM

## 2023-12-26 MED ORDER — INSULIN REGULAR HUMAN 100 UNIT/ML IJ SOLN: 10.0000 [IU] | Freq: Two times a day (BID) | INTRAMUSCULAR | 11 refills | Status: AC

## 2023-12-27 ENCOUNTER — Encounter (INDEPENDENT_AMBULATORY_CARE_PROVIDER_SITE_OTHER): Payer: Self-pay | Admitting: Primary Care

## 2023-12-27 ENCOUNTER — Encounter: Payer: Self-pay | Admitting: Internal Medicine

## 2023-12-27 ENCOUNTER — Telehealth (INDEPENDENT_AMBULATORY_CARE_PROVIDER_SITE_OTHER): Payer: Self-pay | Admitting: Primary Care

## 2023-12-27 ENCOUNTER — Other Ambulatory Visit (HOSPITAL_COMMUNITY): Payer: Self-pay

## 2023-12-27 ENCOUNTER — Telehealth: Payer: Self-pay

## 2023-12-27 NOTE — Telephone Encounter (Signed)
[facility] Patient Advocate Encounter  Insurance verification completed.    The patient is insured through  [employer] [place] .     We will continue to follow to see if copay assistance is needed.  This test claim was processed through [facility]- copay amounts may vary at other pharmacies due to pharmacy/plan contracts, or as the patient moves through the different stages of their insurance plan.

## 2023-12-27 NOTE — Telephone Encounter (Signed)
 Will forward to provider

## 2023-12-27 NOTE — Telephone Encounter (Signed)
Copied from CRM [id number]. Topic: Clinical - Medication Question >> Dec 27, 2023  1:05 PM Clayton Bibles wrote: Reason for CRM: [facility] do not [facility] regular (HUMULIN R) 100 units/mL. Pharmacy wants to know if you want to change it to Novolin R 100 units per ML. Please call back at [phone] Ext. [phone]

## 2023-12-28 ENCOUNTER — Encounter: Payer: Self-pay | Admitting: Internal Medicine

## 2023-12-28 NOTE — Telephone Encounter (Signed)
[facility] PA form completed and email to provider for signature office to fax   [facility] and ph#  [phone] ext  [phone] When completed

## 2023-12-29 DIAGNOSIS — Z510 Encounter for antineoplastic radiation therapy: Secondary | ICD-10-CM | POA: Diagnosis not present

## 2024-01-02 ENCOUNTER — Ambulatory Visit
Admission: RE | Admit: 2024-01-02 | Discharge: 2024-01-02 | Disposition: A | Discharge status: Home / Self Care | Payer: No Typology Code available for payment source | Source: Ambulatory Visit | Attending: Radiation Oncology

## 2024-01-02 ENCOUNTER — Other Ambulatory Visit: Payer: Self-pay

## 2024-01-02 DIAGNOSIS — Z510 Encounter for antineoplastic radiation therapy: Secondary | ICD-10-CM | POA: Diagnosis not present

## 2024-01-02 LAB — RAD ONC ARIA SESSION SUMMARY
Course Elapsed Days: 0
Plan Fractions Treated to Date: 1
Plan Prescribed Dose Per Fraction: 12 Gy
Plan Total Fractions Prescribed: 5
Plan Total Prescribed Dose: 60 Gy
Reference Point Dosage Given to Date: 12 Gy
Reference Point Session Dosage Given: 12 Gy
Session Number: 1

## 2024-01-03 ENCOUNTER — Ambulatory Visit: Payer: No Typology Code available for payment source

## 2024-01-04 ENCOUNTER — Other Ambulatory Visit: Payer: Self-pay

## 2024-01-04 ENCOUNTER — Ambulatory Visit
Admission: RE | Admit: 2024-01-04 | Discharge: 2024-01-04 | Disposition: A | Discharge status: Home / Self Care | Payer: No Typology Code available for payment source | Source: Ambulatory Visit | Attending: Radiation Oncology

## 2024-01-04 DIAGNOSIS — Z510 Encounter for antineoplastic radiation therapy: Secondary | ICD-10-CM | POA: Diagnosis not present

## 2024-01-04 LAB — RAD ONC ARIA SESSION SUMMARY
Course Elapsed Days: 2
Plan Fractions Treated to Date: 2
Plan Prescribed Dose Per Fraction: 12 Gy
Plan Total Fractions Prescribed: 5
Plan Total Prescribed Dose: 60 Gy
Reference Point Dosage Given to Date: 24 Gy
Reference Point Session Dosage Given: 12 Gy
Session Number: 2

## 2024-01-05 ENCOUNTER — Ambulatory Visit: Payer: No Typology Code available for payment source

## 2024-01-08 ENCOUNTER — Telehealth: Payer: Self-pay

## 2024-01-08 ENCOUNTER — Other Ambulatory Visit: Payer: Self-pay

## 2024-01-08 ENCOUNTER — Ambulatory Visit
Admission: RE | Admit: 2024-01-08 | Discharge: 2024-01-08 | Disposition: A | Discharge status: Home / Self Care | Payer: No Typology Code available for payment source | Source: Ambulatory Visit | Attending: Radiation Oncology | Admitting: Radiation Oncology

## 2024-01-08 ENCOUNTER — Other Ambulatory Visit (HOSPITAL_COMMUNITY): Payer: Self-pay

## 2024-01-08 DIAGNOSIS — Z510 Encounter for antineoplastic radiation therapy: Secondary | ICD-10-CM | POA: Diagnosis present

## 2024-01-08 DIAGNOSIS — C3411 Malignant neoplasm of upper lobe, right bronchus or lung: Secondary | ICD-10-CM | POA: Insufficient documentation

## 2024-01-08 LAB — RAD ONC ARIA SESSION SUMMARY
Course Elapsed Days: 6
Plan Fractions Treated to Date: 3
Plan Prescribed Dose Per Fraction: 12 Gy
Plan Total Fractions Prescribed: 5
Plan Total Prescribed Dose: 60 Gy
Reference Point Dosage Given to Date: 36 Gy
Reference Point Session Dosage Given: 12 Gy
Session Number: 3

## 2024-01-08 NOTE — Telephone Encounter (Signed)
 [facility] Patient Advocate Encounter ? ?Insurance verification completed.   ? ?The patient is uninsured and will need patient assistance for medication. ? ?We can complete the application and will need to meet with the patient for signatures and income documentation. ? ?Clearance Coots, CPhT ?Specialty Pharmacy Patient Advocate ?[facility] ?Phone: [phone] ?Fax:  [phone]  ?

## 2024-01-10 ENCOUNTER — Other Ambulatory Visit: Payer: Self-pay

## 2024-01-10 ENCOUNTER — Other Ambulatory Visit (HOSPITAL_COMMUNITY): Payer: Self-pay

## 2024-01-10 ENCOUNTER — Encounter: Payer: Self-pay | Admitting: Infectious Diseases

## 2024-01-10 ENCOUNTER — Ambulatory Visit (INDEPENDENT_AMBULATORY_CARE_PROVIDER_SITE_OTHER): Payer: Medicare (Managed Care) | Admitting: Infectious Diseases

## 2024-01-10 ENCOUNTER — Ambulatory Visit
Admission: RE | Admit: 2024-01-10 | Discharge: 2024-01-10 | Disposition: A | Discharge status: Home / Self Care | Payer: No Typology Code available for payment source | Source: Ambulatory Visit | Attending: Radiation Oncology | Admitting: Radiation Oncology

## 2024-01-10 ENCOUNTER — Encounter: Payer: Self-pay | Admitting: Internal Medicine

## 2024-01-10 VITALS — BP 134/85 | HR 95 | Temp 98.6°F | Resp 16 | Wt 244.0 lb

## 2024-01-10 DIAGNOSIS — E119 Type 2 diabetes mellitus without complications: Secondary | ICD-10-CM

## 2024-01-10 DIAGNOSIS — Z510 Encounter for antineoplastic radiation therapy: Secondary | ICD-10-CM | POA: Diagnosis not present

## 2024-01-10 DIAGNOSIS — Z8619 Personal history of other infectious and parasitic diseases: Secondary | ICD-10-CM

## 2024-01-10 LAB — RAD ONC ARIA SESSION SUMMARY
Course Elapsed Days: 8
Plan Fractions Treated to Date: 4
Plan Prescribed Dose Per Fraction: 12 Gy
Plan Total Fractions Prescribed: 5
Plan Total Prescribed Dose: 60 Gy
Reference Point Dosage Given to Date: 48 Gy
Reference Point Session Dosage Given: 12 Gy
Session Number: 4

## 2024-01-10 NOTE — Patient Instructions (Addendum)
 Switch to [employer] instead of the sweet tea - look for ZERO SUGAR options   Continuous Glucose monitor to help with more blood sugar management   You don't need anything further for hepatitis C management - your labs only show an old infection that was well treated.

## 2024-01-10 NOTE — Progress Notes (Signed)
 Patient Name: Kevin Lambert.  Date of Birth: Aug 24, 1956  MRN: [id number]  PCP: [name], NP  Referring Provider: [name], NP, Ph#: [phone]   Subjective   Subjective:  CC: New patient - initial evaluation and management of chronic hepatitis C infection.   Discussed the use of AI scribe software for clinical note transcription with the patient, who gave verbal consent to proceed.  History of Present Illness   Kevin Lambert. is a 68 year old male who presents for follow-up regarding hepatitis C antibody test positive.  He recently had blood work done with his PCP. He completed a 12-week course of Harvoni for hepatitis C in 2016, with follow-up tests at six and ten weeks post-treatment showing no detectable virus. Recent lab work confirms normal liver function tests, reactive hepatitis c antibody and negative hepatitis c RNA level - confirming previously successful treatment.   He has diabetes with a recent A1c of 9.1, an increase from previous levels in the sixes. He acknowledges dietary challenges, including a past habit of consuming sodas, now replaced with Lipton green tea. He is exploring dietary changes to better manage his blood sugar levels. He recently received insulin and needles in the mail without prior discussion about starting insulin therapy and plans to discuss this with his primary care provider.       Review of Systems  Constitutional:  Negative for chills and fever.  HENT:  Negative for tinnitus.   Eyes:  Negative for blurred vision and photophobia.  Respiratory:  Negative for cough and sputum production.   Cardiovascular:  Negative for chest pain.  Gastrointestinal:  Negative for diarrhea, nausea and vomiting.  Genitourinary:  Negative for dysuria.  Skin:  Negative for rash.  Neurological:  Negative for headaches.    Past Medical History:  Diagnosis Date   Arthritis    Chronic kidney disease    Depression    DM (diabetes  mellitus) (HCC)    type II   Dyslipidemia    Dyspnea    unable to walk much - he thinks it is from    GERD (gastroesophageal reflux disease)    Hepatitis    Hepatitis C- treated   History of kidney stones    HTN (hypertension)    Lung cancer (HCC)    Neuropathy    nscl ca dx'd 10/2019   OSA on CPAP    PTSD (post-traumatic stress disorder)     Outpatient Medications Prior to Visit  Medication Sig Dispense Refill   amLODipine (NORVASC) 10 MG tablet Take 1 tablet (10 mg total) by mouth daily. 90 tablet 1   aspirin EC 81 MG tablet Take 81 mg by mouth daily.     atorvastatin (LIPITOR) 10 MG tablet Take 1 tablet (10 mg total) by mouth daily. 90 tablet 1   blood glucose meter kit and supplies Dispense based on patient and insurance preference. Patient to check blood sugars daily, prior to eating and also as needed (FOR ICD-10 E10.9, E11.9). 1 each 0   carvedilol (COREG) 3.125 MG tablet Take 1 tablet (3.125 mg total) by mouth 2 (two) times daily with a meal. 180 tablet 1   chlorthalidone (HYGROTON) 25 MG tablet Take 1 tablet (25 mg total) by mouth daily. 90 tablet 1   cyclobenzaprine (FLEXERIL) 10 MG tablet Take 1 tablet (10 mg total) by mouth at bedtime as needed for muscle spasms. 30 tablet 2   diclofenac Sodium (VOLTAREN) 1 % GEL Apply topically.  empagliflozin (JARDIANCE) 25 MG TABS tablet Take 1 tablet (25 mg total) by mouth daily before breakfast. 90 tablet 1   insulin regular (HUMULIN R) 100 units/mL injection Inject 0.1 mLs (10 Units total) into the skin 2 (two) times daily before a meal. 10 mL 11   nicotine (NICODERM CQ - DOSED IN MG/24 HOURS) 14 mg/24hr patch Place 14 mg onto the skin daily.     omeprazole (PRILOSEC) 20 MG capsule Take 1 capsule (20 mg total) by mouth daily. 90 capsule 1   sildenafil (VIAGRA) 100 MG tablet TAKE ONE TABLET BY MOUTH AS INSTRUCTED (TAKE 1 HOUR PRIOR TO SEXUAL ACTIVITY *DO NOT EXCEED 1 DOSE PER 24 HOUR PERIOD*) 10 tablet 5   Semaglutide,0.25 or  0.5MG /DOS, 2 MG/3ML SOPN Inject 0.25 mg into the skin once a week. (Patient not taking: Reported on 07/05/2023) 3 mL 2   No facility-administered medications prior to visit.     Allergies  Allergen Reactions   Metformin And Related Other (See Comments)    Severe muscle spasms   Naproxen Other (See Comments)    Pt states it have him off balance.   Oxycontin [Oxycodone] Hives    Social History   Tobacco Use   Smoking status: Some Days    Types: Cigars    Passive exposure: Past   Smokeless tobacco: Never   Tobacco comments:    05/23/22. : 1-2 Black & Milds / LW  Vaping Use   Vaping status: Never Used  Substance Use Topics   Alcohol use: Yes    Alcohol/week: 6.0 standard drinks of alcohol    Types: 6 Cans of beer per week    Comment: occ   Drug use: Never    Family History  Problem Relation Age of Onset   Liver disease Mother    Diabetes Father    Diabetes Brother    Esophageal cancer Neg Hx    Colon cancer Neg Hx    Stomach cancer Neg Hx    Pancreatic cancer Neg Hx        Objective   Objective:   Vitals:   01/10/24 1455  BP: 134/85  Pulse: 95  Resp: 16  Temp: 98.6 F (37 C)  TempSrc: Oral  SpO2: 97%  Weight: 244 lb (110.7 kg)   Body mass index is 37.65 kg/m.  Physical Exam Constitutional:      Appearance: Normal appearance. He is not ill-appearing.  HENT:     Head: Normocephalic.     Mouth/Throat:     Mouth: Mucous membranes are moist.     Pharynx: Oropharynx is clear.  Eyes:     General: No scleral icterus. Cardiovascular:     Rate and Rhythm: Normal rate.  Pulmonary:     Effort: Pulmonary effort is normal.  Musculoskeletal:        General: Normal range of motion.     Cervical back: Normal range of motion.  Skin:    Coloration: Skin is not jaundiced or pale.  Neurological:     Mental Status: He is alert and oriented to person, place, and time.  Psychiatric:        Mood and Affect: Mood normal.        Judgment: Judgment normal.        Assessment & Plan:     Hepatitis C - Successfully Cured -  Non-Cirrhotic -  Successfully treated with Harvoni in 2016. Recent labs confirm no active infection and normal liver function. No evidence of cirrhosis on  recent abdominal imaging. He does not need any cancer screenings given no cirrhosis in the past or observed now. We discussed his lab results today and that he will always have a lifelong positive antibody in the blood .  He has no concerns for re-infection risk.  -No further treatment or follow-up needed for Hepatitis C. -No additional screenings required for health maintenance.   Diabetes Mellitus - Recent A1C 9.1, indicating poor control. Patient reports high sugar intake, particularly from sweetened beverages. Discussed that this can also impact overall liver health and progress to fatty liver deposition.  -Encouraged to switch from sweetened beverages to Crystal Light or similar low-sugar options. -Consider use of continuous glucose monitor to better understand blood sugar trends and the impact of diet.       No orders of the defined types were placed in this encounter.   No orders of the defined types were placed in this encounter.   Return if symptoms worsen or fail to improve.   Rexene Alberts, MSN, NP-C [facility]  [email or web address] Pager: [phone] Office: [phone] [facility] Main Line: [phone] *Secure Chat Communication Welcome

## 2024-01-12 ENCOUNTER — Ambulatory Visit
Admission: RE | Admit: 2024-01-12 | Discharge: 2024-01-12 | Disposition: A | Discharge status: Home / Self Care | Payer: No Typology Code available for payment source | Source: Ambulatory Visit | Attending: Radiation Oncology | Admitting: Radiation Oncology

## 2024-01-12 ENCOUNTER — Other Ambulatory Visit: Payer: Self-pay

## 2024-01-12 DIAGNOSIS — C3491 Malignant neoplasm of unspecified part of right bronchus or lung: Secondary | ICD-10-CM

## 2024-01-12 DIAGNOSIS — Z510 Encounter for antineoplastic radiation therapy: Secondary | ICD-10-CM | POA: Diagnosis not present

## 2024-01-12 LAB — RAD ONC ARIA SESSION SUMMARY
Course Elapsed Days: 10
Plan Fractions Treated to Date: 5
Plan Prescribed Dose Per Fraction: 12 Gy
Plan Total Fractions Prescribed: 5
Plan Total Prescribed Dose: 60 Gy
Reference Point Dosage Given to Date: 60 Gy
Reference Point Session Dosage Given: 12 Gy
Session Number: 5

## 2024-01-15 NOTE — Radiation Completion Notes (Addendum)
  Radiation Oncology         [phone]) [phone] ________________________________  Name: Kevin Lambert. MRN: [id number]  Date: 01/12/2024  DOB: 1955/12/08  Referring Physician: Ritta Slot, M.D. Date of Service: 2024-01-15 Radiation Oncologist: Margaretmary Bayley, M.D. 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, N=cN0, M=cM0 Staging on 2020-11-19: Non-small cell cancer of right lung (HCC) T=cT2b, N=cN3, M=cM1a Intent: Curative     ==========DELIVERED PLANS==========  First Treatment Date: 2024-01-02 Last Treatment Date: 2024-01-12   Plan Name: Lung_R_SBRT Site: Lung, Right Technique: SBRT/SRT-IMRT Mode: Photon Dose Per Fraction: 12 Gy Prescribed Dose (Delivered / Prescribed): 60 Gy / 60 Gy Prescribed Fxs (Delivered / Prescribed): 5 / 5     ==========ON TREATMENT VISIT DATES========== 2024-01-02, 2024-01-04, 2024-01-08, 2024-01-10, 2024-01-12, 2024-01-12   See weekly On Treatment Notes in Epic for details in the Media tab (listed as Progress notes on the On Treatment Visit Dates listed above).  He tolerated the treatments very well with only mild esophagitis and modest fatigue.  The patient will receive a call in about one month from the radiation oncology department.  We will arrange for a posttreatment CT chest scan in 6 weeks and a telephone follow-up visit thereafter to review results and recommendations.  He will continue follow up with his medical oncologist, Dr. Shirline Frees, as well.  ------------------------------------------------   Margaretmary Dys, MD [facility]  Radiation Oncology Direct Dial: [phone]  Fax: [phone] [email or web address]  Skype  [email or web address]

## 2024-01-16 ENCOUNTER — Encounter: Payer: Self-pay | Admitting: Internal Medicine

## 2024-01-17 ENCOUNTER — Telehealth (INDEPENDENT_AMBULATORY_CARE_PROVIDER_SITE_OTHER): Payer: Self-pay | Admitting: Primary Care

## 2024-01-17 NOTE — Telephone Encounter (Signed)
 Spoke to pt about appt.. Will be present

## 2024-01-18 ENCOUNTER — Ambulatory Visit (INDEPENDENT_AMBULATORY_CARE_PROVIDER_SITE_OTHER): Admitting: Primary Care

## 2024-01-19 ENCOUNTER — Ambulatory Visit (INDEPENDENT_AMBULATORY_CARE_PROVIDER_SITE_OTHER): Admitting: Primary Care

## 2024-01-24 ENCOUNTER — Encounter: Payer: Self-pay | Admitting: Internal Medicine

## 2024-01-31 ENCOUNTER — Other Ambulatory Visit: Payer: Self-pay

## 2024-01-31 ENCOUNTER — Ambulatory Visit (INDEPENDENT_AMBULATORY_CARE_PROVIDER_SITE_OTHER): Admitting: Primary Care

## 2024-01-31 ENCOUNTER — Encounter: Payer: Self-pay | Admitting: Internal Medicine

## 2024-01-31 ENCOUNTER — Ambulatory Visit (INDEPENDENT_AMBULATORY_CARE_PROVIDER_SITE_OTHER): Payer: Medicare (Managed Care) | Admitting: Podiatry

## 2024-01-31 DIAGNOSIS — M79675 Pain in left toe(s): Secondary | ICD-10-CM

## 2024-01-31 DIAGNOSIS — E1165 Type 2 diabetes mellitus with hyperglycemia: Secondary | ICD-10-CM | POA: Diagnosis not present

## 2024-01-31 DIAGNOSIS — B351 Tinea unguium: Secondary | ICD-10-CM | POA: Diagnosis not present

## 2024-01-31 DIAGNOSIS — B353 Tinea pedis: Secondary | ICD-10-CM

## 2024-01-31 DIAGNOSIS — M79674 Pain in right toe(s): Secondary | ICD-10-CM | POA: Diagnosis not present

## 2024-01-31 MED ORDER — KETOCONAZOLE 2 % EX CREA: 1.0000 | TOPICAL_CREAM | Freq: Every day | CUTANEOUS | 2 refills | Status: DC

## 2024-01-31 NOTE — Progress Notes (Signed)
  Subjective:  Patient ID: [name], male    DOB: 1956/10/23,   MRN: [id number]  No chief complaint on file.   68 y.o. male presents for concern of thickened elongated and painful nails that are difficult to trim. Requesting to have them trimmed today. Relates burning and tingling in their feet. Patient is diabetic and last A1c was  Lab Results  Component Value Date   HGBA1C 9.1 (H) 12/19/2023   .   PCP:  [name], NP    . Denies any other pedal complaints. Denies n/v/f/c.   Past Medical History:  Diagnosis Date   Arthritis    Chronic kidney disease    Depression    DM (diabetes mellitus) (HCC)    type II   Dyslipidemia    Dyspnea    unable to walk much - he thinks it is from    GERD (gastroesophageal reflux disease)    Hepatitis    Hepatitis C- treated   History of kidney stones    HTN (hypertension)    Lung cancer (HCC)    Neuropathy    nscl ca dx'd 10/2019   OSA on CPAP    PTSD (post-traumatic stress disorder)     Objective:  Physical Exam: Vascular: DP/PT pulses 1/4 bilateral. CFT <3 seconds. Absent hair growth on digits. Edema noted to bilateral lower extremities. Xerosis noted bilaterally.  Skin. No lacerations or abrasions bilateral feet. Nails 1-5 bilateral  are thickened discolored and elongated with subungual debris.  Musculoskeletal: MMT 5/5 bilateral lower extremities in DF, PF, Inversion and Eversion. Deceased ROM in DF of ankle joint.  Neurological: Sensation intact to light touch. Protective sensation diminished bilateral.    Assessment:   1. Pain due to onychomycosis of toenails of both feet   2. Type 2 diabetes mellitus with hyperglycemia, without long-term current use of insulin (HCC)        Plan:  Patient was evaluated and treated and all questions answered. -Discussed and educated patient on diabetic foot care, especially with  regards to the vascular, neurological and musculoskeletal systems.  -Stressed the importance of  good glycemic control and the detriment of not  controlling glucose levels in relation to the foot. -Discussed supportive shoes at all times and checking feet regularly.   -Mechanically debrided all nails 1-5 bilateral using sterile nail nipper and filed with dremel without incident  -Ketoconazole sent to pharmacy  -Answered all patient questions -Patient to return  in 3 months for at risk foot care -Patient advised to call the office if any problems or questions arise in the meantime.   Louann Sjogren, DPM

## 2024-02-01 ENCOUNTER — Inpatient Hospital Stay: Payer: Medicare (Managed Care)

## 2024-02-02 ENCOUNTER — Inpatient Hospital Stay: Payer: Medicare (Managed Care)

## 2024-02-05 ENCOUNTER — Encounter

## 2024-02-07 ENCOUNTER — Encounter (INDEPENDENT_AMBULATORY_CARE_PROVIDER_SITE_OTHER): Payer: Self-pay | Admitting: Primary Care

## 2024-02-07 ENCOUNTER — Telehealth (INDEPENDENT_AMBULATORY_CARE_PROVIDER_SITE_OTHER): Payer: Self-pay

## 2024-02-07 NOTE — Telephone Encounter (Signed)
 Copied from CRM [id number]. Topic: Clinical - Medication Question >> Feb 06, 2024  9:44 AM Gery Pray wrote: Reason for CRM: Patient would like to know if he should continue taking his pill for diabetes with the insulin shot or discontinue taking the pill now that he is taking insulin everyday. Please contact patient at [phone]

## 2024-02-08 ENCOUNTER — Other Ambulatory Visit: Payer: Self-pay

## 2024-02-08 ENCOUNTER — Inpatient Hospital Stay: Payer: Medicare (Managed Care) | Attending: Internal Medicine

## 2024-02-08 VITALS — BP 125/79 | HR 98 | Temp 99.0°F | Resp 17

## 2024-02-08 DIAGNOSIS — C3491 Malignant neoplasm of unspecified part of right bronchus or lung: Secondary | ICD-10-CM

## 2024-02-08 DIAGNOSIS — Z452 Encounter for adjustment and management of vascular access device: Secondary | ICD-10-CM | POA: Insufficient documentation

## 2024-02-08 DIAGNOSIS — Z95828 Presence of other vascular implants and grafts: Secondary | ICD-10-CM

## 2024-02-08 DIAGNOSIS — C3411 Malignant neoplasm of upper lobe, right bronchus or lung: Secondary | ICD-10-CM | POA: Diagnosis present

## 2024-02-08 MED ORDER — SODIUM CHLORIDE 0.9% FLUSH
10.0000 mL | Freq: Once | INTRAVENOUS | Status: AC
Start: 2024-02-08 — End: 2024-02-08
  Administered 2024-02-08: 10 mL

## 2024-02-08 MED ORDER — HEPARIN SOD (PORK) LOCK FLUSH 100 UNIT/ML IV SOLN
500.0000 [IU] | Freq: Once | INTRAVENOUS | Status: AC
Start: 2024-02-08 — End: 2024-02-08
  Administered 2024-02-08: 500 [IU]

## 2024-02-08 NOTE — Telephone Encounter (Signed)
 Copied from CRM [id number]. Topic: Clinical - Prescription Issue >> Feb 07, 2024 10:59 AM Carlatta H wrote: Reason for CRM:  Patient would like to know if he should continue taking his pill for diabetes with the insulin shot or discontinue taking the pill now that he is taking insulin everyday. Please contact patient at [phone]

## 2024-02-12 ENCOUNTER — Encounter: Payer: Self-pay | Admitting: Internal Medicine

## 2024-02-12 ENCOUNTER — Telehealth: Payer: Self-pay | Admitting: Medical Oncology

## 2024-02-12 NOTE — Telephone Encounter (Signed)
 Persistent dry cough x 3 weeks -all day and night long.  I told pt he has a post xrt call tomorrow and to let provider know about his cough.

## 2024-02-12 NOTE — Telephone Encounter (Signed)
 Will send to provider

## 2024-02-13 ENCOUNTER — Other Ambulatory Visit: Payer: Self-pay | Admitting: Urology

## 2024-02-13 ENCOUNTER — Ambulatory Visit
Admission: RE | Admit: 2024-02-13 | Discharge: 2024-02-13 | Disposition: A | Discharge status: Home / Self Care | Source: Ambulatory Visit | Attending: Primary Care | Admitting: Primary Care

## 2024-02-13 DIAGNOSIS — C3491 Malignant neoplasm of unspecified part of right bronchus or lung: Secondary | ICD-10-CM

## 2024-02-13 MED ORDER — BENZONATATE 200 MG PO CAPS: 200.0000 mg | ORAL_CAPSULE | Freq: Three times a day (TID) | ORAL | 1 refills | Status: DC | PRN

## 2024-02-13 MED ORDER — HYDROCOD POLI-CHLORPHE POLI ER 10-8 MG/5ML PO SUER: 5.0000 mL | Freq: Every evening | ORAL | 0 refills | Status: DC | PRN

## 2024-02-13 NOTE — Progress Notes (Addendum)
  Radiation Oncology         [phone]) [phone] ________________________________  Name: Kevin Lambert. MRN: [id number]  Date of Service: 02/13/2024  DOB: [birth year]  Post Treatment Telephone Note  Diagnosis:  C34.11 Malignant neoplasm of upper lobe, right bronchus or lung (as documented in provider EOT note)  The patient was available for call today.   Patient reports a full body rash w/ severe itching. Rash is clearing but itching persists. Advised patient to take 1 Benadryl tab before bed, as long as he is okay to take it. Patient has also developed a persistent, severe cough x3 weeks.   Dr. Si Gaul and nurse Vincent Peyer RN are aware of issues.  Symptoms of fatigue have not improved since completing therapy.  Symptoms of skin changes have not improved since completing therapy.  Symptoms of esophagitis have improved since completing therapy.  The patient has scheduled follow up with his medical oncologist Dr. Arbutus Ped for ongoing care, and was encouraged to call if he develops concerns or questions regarding radiation.   This concludes the interaction.  [name], LPN

## 2024-02-14 ENCOUNTER — Other Ambulatory Visit: Payer: Self-pay | Admitting: Urology

## 2024-02-14 ENCOUNTER — Telehealth: Payer: Self-pay

## 2024-02-14 MED ORDER — GUAIFENESIN-CODEINE 225-7.5 MG/5ML PO LIQD: 5.0000 mL | Freq: Every evening | ORAL | 0 refills | Status: DC | PRN

## 2024-02-14 NOTE — Telephone Encounter (Signed)
 Called patient. Identity verified x2. Patient notified of all information below, per Ashlyn Bruning PA-C.  Please let patient know that I do not think the rash is related to the SBRT so if it persists, he should seek evaluation with his PCP. The cough is likely related to the radiation and I am happy to prescribe a cough suppressant for symptomatic management. I have sent Tessalon Perles to use during the day and Tussionex cough syrup to use at night- Rxs sent to [facility]. He should let us know if the cough worsens, become productive or if he develops fever/chills.  -Ashlyn   Pharmacy did not have Tussionex and it was changed to Codeine/Guaifenesin, per [employer] PA-C  Patient verbalized understanding of all info discussed above.  This concludes the interaction.  [name], LPN

## 2024-02-16 ENCOUNTER — Other Ambulatory Visit: Payer: Self-pay | Admitting: Urology

## 2024-02-16 MED ORDER — GUAIFENESIN-CODEINE 100-10 MG/5ML PO SOLN: 5.0000 mL | Freq: Every evening | ORAL | 1 refills | Status: DC | PRN

## 2024-02-21 ENCOUNTER — Telehealth (INDEPENDENT_AMBULATORY_CARE_PROVIDER_SITE_OTHER): Payer: Self-pay | Admitting: Primary Care

## 2024-02-21 NOTE — Telephone Encounter (Signed)
 Called pt to confirm appt. Pt will be present.

## 2024-02-22 ENCOUNTER — Ambulatory Visit (INDEPENDENT_AMBULATORY_CARE_PROVIDER_SITE_OTHER): Payer: Medicare (Managed Care) | Admitting: Primary Care

## 2024-02-22 ENCOUNTER — Telehealth: Payer: Self-pay

## 2024-02-22 ENCOUNTER — Telehealth: Payer: Self-pay | Admitting: Medical Oncology

## 2024-02-22 ENCOUNTER — Encounter (INDEPENDENT_AMBULATORY_CARE_PROVIDER_SITE_OTHER): Payer: Self-pay | Admitting: Primary Care

## 2024-02-22 VITALS — BP 130/76 | HR 89 | Resp 16 | Wt 234.4 lb

## 2024-02-22 DIAGNOSIS — Z7984 Long term (current) use of oral hypoglycemic drugs: Secondary | ICD-10-CM

## 2024-02-22 DIAGNOSIS — E1169 Type 2 diabetes mellitus with other specified complication: Secondary | ICD-10-CM

## 2024-02-22 NOTE — Progress Notes (Signed)
 [facility]  [name], is a 68 y.o. male  ZOX:[id number]  [id number]  DOB - 30-Apr-1956  Chief Complaint  Patient presents with   Medication Management       Subjective:   Kevin Lambert is a 68 y.o. male here today for an acute visit.  HPI Kevin Lambert. is a 68 year old male in today to discuss type 2 diabetes and how to take his medication.  Complications from uncontrolled diabetes -diabetic retinopathy leading to blindness, diabetic nephropathy leading to dialysis, decrease in circulation decrease in sores or wound healing which may lead to amputations and increase of heart attack and stroke  Does not check blood sugars- has meter at home. States his problem was drinking pepsi 10 a day- stopped completely.  Comprehensive ROS Pertinent positive and negative noted in HPI   Allergies  Allergen Reactions   Metformin And Related Other (See Comments)    Severe muscle spasms   Naproxen Other (See Comments)    Pt states it have him off balance.   Oxycontin [Oxycodone] Hives    Past Medical History:  Diagnosis Date   Arthritis    Chronic kidney disease    Depression    DM (diabetes mellitus) (HCC)    type II   Dyslipidemia    Dyspnea    unable to walk much - he thinks it is from    GERD (gastroesophageal reflux disease)    Hepatitis    Hepatitis C- treated   History of kidney stones    HTN (hypertension)    Lung cancer (HCC)    Neuropathy    nscl ca dx'd 10/2019   OSA on CPAP    PTSD (post-traumatic stress disorder)     Current Outpatient Medications on File Prior to Visit  Medication Sig Dispense Refill   amLODipine (NORVASC) 10 MG tablet Take 1 tablet (10 mg total) by mouth daily. 90 tablet 1   aspirin EC 81 MG tablet Take 81 mg by mouth daily.     atorvastatin (LIPITOR) 10 MG tablet Take 1 tablet (10 mg total) by mouth daily. 90 tablet 1   benzonatate (TESSALON) 200 MG capsule Take 1 capsule (200 mg total) by mouth 3 (three) times daily  as needed for cough. 30 capsule 1   blood glucose meter kit and supplies Dispense based on patient and insurance preference. Patient to check blood sugars daily, prior to eating and also as needed (FOR ICD-10 E10.9, E11.9). 1 each 0   carvedilol (COREG) 3.125 MG tablet Take 1 tablet (3.125 mg total) by mouth 2 (two) times daily with a meal. 180 tablet 1   chlorthalidone (HYGROTON) 25 MG tablet Take 1 tablet (25 mg total) by mouth daily. 90 tablet 1   cyclobenzaprine (FLEXERIL) 10 MG tablet Take 1 tablet (10 mg total) by mouth at bedtime as needed for muscle spasms. 30 tablet 2   diclofenac Sodium (VOLTAREN) 1 % GEL Apply topically.     empagliflozin (JARDIANCE) 25 MG TABS tablet Take 1 tablet (25 mg total) by mouth daily before breakfast. 90 tablet 1   guaiFENesin-codeine 100-10 MG/5ML syrup Take 5 mLs by mouth at bedtime as needed for cough. 118 mL 1   insulin regular (HUMULIN R) 100 units/mL injection Inject 0.1 mLs (10 Units total) into the skin 2 (two) times daily before a meal. 10 mL 11   ketoconazole (NIZORAL) 2 % cream Apply 1 Application topically daily. 60 g 2   nicotine (NICODERM CQ - DOSED IN MG/24  HOURS) 14 mg/24hr patch Place 14 mg onto the skin daily.     omeprazole (PRILOSEC) 20 MG capsule Take 1 capsule (20 mg total) by mouth daily. 90 capsule 1   Semaglutide,0.25 or 0.5MG /DOS, 2 MG/3ML SOPN Inject 0.25 mg into the skin once a week. (Patient not taking: Reported on 07/05/2023) 3 mL 2   sildenafil (VIAGRA) 100 MG tablet TAKE ONE TABLET BY MOUTH AS INSTRUCTED (TAKE 1 HOUR PRIOR TO SEXUAL ACTIVITY *DO NOT EXCEED 1 DOSE PER 24 HOUR PERIOD*) 10 tablet 5   No current facility-administered medications on file prior to visit.   Health Maintenance  Topic Date Due   Eye exam for diabetics  Never done   Colon Cancer Screening  Never done   COVID-19 Vaccine (3 - Pfizer risk series) 01/28/2021   Complete foot exam   12/21/2023   Pneumonia Vaccine (1 of 2 - PCV) 12/18/2024*   Flu Shot   06/07/2024   Hemoglobin A1C  06/17/2024   Medicare Annual Wellness Visit  08/27/2024   Yearly kidney function blood test for diabetes  12/18/2024   Yearly kidney health urinalysis for diabetes  12/18/2024   DTaP/Tdap/Td vaccine (2 - Td or Tdap) 07/14/2027   Hepatitis C Screening  Completed   Zoster (Shingles) Vaccine  Completed   HPV Vaccine  Aged Out   Meningitis B Vaccine  Aged Out  *Topic was postponed. The date shown is not the original due date.    Objective:   Vitals:   02/22/24 1416  BP: 130/76  Pulse: 89  Resp: 16  SpO2: 100%  Weight: 234 lb 6.4 oz (106.3 kg)   BP Readings from Last 3 Encounters:  02/22/24 130/76  02/08/24 125/79  01/10/24 134/85      Physical Exam Vitals reviewed.  Constitutional:      Appearance: He is obese.  HENT:     Head: Normocephalic.     Right Ear: Tympanic membrane and external ear normal.     Left Ear: Tympanic membrane and external ear normal.     Nose: Nose normal.  Eyes:     Extraocular Movements: Extraocular movements intact.     Pupils: Pupils are equal, round, and reactive to light.  Cardiovascular:     Rate and Rhythm: Normal rate and regular rhythm.  Pulmonary:     Effort: Pulmonary effort is normal.     Breath sounds: Normal breath sounds.  Abdominal:     General: Bowel sounds are normal. There is distension.     Palpations: Abdomen is soft.  Musculoskeletal:        General: Normal range of motion.  Skin:    General: Skin is warm and dry.  Neurological:     Mental Status: He is oriented to person, place, and time.  Psychiatric:        Mood and Affect: Mood normal.        Behavior: Behavior normal.        Thought Content: Thought content normal.        Judgment: Judgment normal.      Assessment & Plan  Sylis was seen today for medication management.  Diagnoses and all orders for this visit:  Type 2 diabetes mellitus with other specified complication, with long-term current use of insulin (HCC) See HPI  -takes Jardiance and will start Ozempic next week.  Demonstrated how to take ozempic. Patient have been counseled extensively about nutrition and exercise. Other issues discussed during this visit include: low cholesterol diet, weight  control and daily exercise, foot care, annual eye examinations at Ophthalmology, importance of adherence with medications and regular follow-up. We also discussed long term complications of uncontrolled diabetes and hypertension.   Return in about 1 month (around 03/23/2024) for fasting labs.  The patient was given clear instructions to go to ER or return to medical center if symptoms don't improve, worsen or new problems develop. The patient verbalized understanding. The patient was told to call to get lab results if they haven't heard anything in the next week.   This note has been created with Education officer, environmental. Any transcriptional errors are unintentional.   Marius Siemens, NP 02/22/2024, 2:48 PM

## 2024-02-22 NOTE — Telephone Encounter (Signed)
 He wants to get an appointment  for CT scan. I gave him the number to central scheduling.

## 2024-02-22 NOTE — Telephone Encounter (Signed)
 Patient called stating "His cough, and rash/itching has vastly improved and he is feeling much better. The Tessalon, Codeine  and benadryl did the trick." I told patient I would share this information w/ his care team here at [facility].   Patient verbalized understanding.  This concludes the interaction.  Avery Bodo, LPN

## 2024-02-22 NOTE — Patient Instructions (Addendum)
 Complications from uncontrolled diabetes -diabetic retinopathy leading to blindness, diabetic nephropathy leading to dialysis, decrease in circulation decrease in sores or wound healing which may lead to amputations and increase of heart attack and stroke Diabetes Mellitus and Nutrition, Adult When you have diabetes, or diabetes mellitus, it is very important to have healthy eating habits because your blood sugar (glucose) levels are greatly affected by what you eat and drink. Eating healthy foods in the right amounts, at about the same times every day, can help you: Manage your blood glucose. Lower your risk of heart disease. Improve your blood pressure. Reach or maintain a healthy weight. What can affect my meal plan? Every person with diabetes is different, and each person has different needs for a meal plan. Your health care provider may recommend that you work with a dietitian to make a meal plan that is best for you. Your meal plan may vary depending on factors such as: The calories you need. The medicines you take. Your weight. Your blood glucose, blood pressure, and cholesterol levels. Your activity level. Other health conditions you have, such as heart or kidney disease. How do carbohydrates affect me? Carbohydrates, also called carbs, affect your blood glucose level more than any other type of food. Eating carbs raises the amount of glucose in your blood. It is important to know how many carbs you can safely have in each meal. This is different for every person. Your dietitian can help you calculate how many carbs you should have at each meal and for each snack. How does alcohol affect me? Alcohol can cause a decrease in blood glucose (hypoglycemia), especially if you use insulin or take certain diabetes medicines by mouth. Hypoglycemia can be a life-threatening condition. Symptoms of hypoglycemia, such as sleepiness, dizziness, and confusion, are similar to symptoms of having too much  alcohol. Do not drink alcohol if: Your health care provider tells you not to drink. You are pregnant, may be pregnant, or are planning to become pregnant. If you drink alcohol: Limit how much you have to: 0-1 drink a day for women. 0-2 drinks a day for men. Know how much alcohol is in your drink. In the [place] one drink equals one 12 oz bottle of beer (355 mL), one 5 oz glass of wine (148 mL), or one 1 oz glass of hard liquor (44 mL). Keep yourself hydrated with water, diet soda, or unsweetened iced tea. Keep in mind that regular soda, juice, and other mixers may contain a lot of sugar and must be counted as carbs. What are tips for following this plan?  Reading food labels Start by checking the serving size on the Nutrition Facts label of packaged foods and drinks. The number of calories and the amount of carbs, fats, and other nutrients listed on the label are based on one serving of the item. Many items contain more than one serving per package. Check the total grams (g) of carbs in one serving. Check the number of grams of saturated fats and trans fats in one serving. Choose foods that have a low amount or none of these fats. Check the number of milligrams (mg) of salt (sodium) in one serving. Most people should limit total sodium intake to less than 2,300 mg per day. Always check the nutrition information of foods labeled as "low-fat" or "nonfat." These foods may be higher in added sugar or refined carbs and should be avoided. Talk to your dietitian to identify your daily goals for nutrients listed on  the label. Shopping Avoid buying canned, pre-made, or processed foods. These foods tend to be high in fat, sodium, and added sugar. Shop around the outside edge of the grocery store. This is where you will most often find fresh fruits and vegetables, bulk grains, fresh meats, and fresh dairy products. Cooking Use low-heat cooking methods, such as baking, instead of high-heat cooking methods,  such as deep frying. Cook using healthy oils, such as olive, canola, or sunflower oil. Avoid cooking with butter, cream, or high-fat meats. Meal planning Eat meals and snacks regularly, preferably at the same times every day. Avoid going long periods of time without eating. Eat foods that are high in fiber, such as fresh fruits, vegetables, beans, and whole grains. Eat 4-6 oz (112-168 g) of lean protein each day, such as lean meat, chicken, fish, eggs, or tofu. One ounce (oz) (28 g) of lean protein is equal to: 1 oz (28 g) of meat, chicken, or fish. 1 egg.  cup (62 g) of tofu. Eat some foods each day that contain healthy fats, such as avocado, nuts, seeds, and fish. What foods should I eat? Fruits Berries. Apples. Oranges. Peaches. Apricots. Plums. Grapes. Mangoes. Papayas. Pomegranates. Kiwi. Cherries. Vegetables Leafy greens, including lettuce, spinach, kale, chard, collard greens, mustard greens, and cabbage. Beets. Cauliflower. Broccoli. Carrots. Green beans. Tomatoes. Peppers. Onions. Cucumbers. Brussels sprouts. Grains Whole grains, such as whole-wheat or whole-grain bread, crackers, tortillas, cereal, and pasta. Unsweetened oatmeal. Quinoa. Brown or wild rice. Meats and other proteins Seafood. Poultry without skin. Lean cuts of poultry and beef. Tofu. Nuts. Seeds. Dairy Low-fat or fat-free dairy products such as milk, yogurt, and cheese. The items listed above may not be a complete list of foods and beverages you can eat and drink. Contact a dietitian for more information. What foods should I avoid? Fruits Fruits canned with syrup. Vegetables Canned vegetables. Frozen vegetables with butter or cream sauce. Grains Refined white flour and flour products such as bread, pasta, snack foods, and cereals. Avoid all processed foods. Meats and other proteins Fatty cuts of meat. Poultry with skin. Breaded or fried meats. Processed meat. Avoid saturated fats. Dairy Full-fat yogurt,  cheese, or milk. Beverages Sweetened drinks, such as soda or iced tea. The items listed above may not be a complete list of foods and beverages you should avoid. Contact a dietitian for more information. Questions to ask a health care provider Do I need to meet with a certified diabetes care and education specialist? Do I need to meet with a dietitian? What number can I call if I have questions? When are the best times to check my blood glucose? Where to find more information: American Diabetes Association: diabetes.org Academy of Nutrition and Dietetics: eatright.[employer] of Diabetes and Digestive and Kidney Diseases: StageSync.si Association of Diabetes Care & Education Specialists: diabeteseducator.org Summary It is important to have healthy eating habits because your blood sugar (glucose) levels are greatly affected by what you eat and drink. It is important to use alcohol carefully. A healthy meal plan will help you manage your blood glucose and lower your risk of heart disease. Your health care provider may recommend that you work with a dietitian to make a meal plan that is best for you. This information is not intended to replace advice given to you by your health care provider. Make sure you discuss any questions you have with your health care provider. Document Revised: 05/26/2020 Document Reviewed: 05/27/2020 Elsevier Patient Education  2024 [employer].

## 2024-02-26 ENCOUNTER — Encounter: Payer: Self-pay | Admitting: Internal Medicine

## 2024-02-26 ENCOUNTER — Telehealth (INDEPENDENT_AMBULATORY_CARE_PROVIDER_SITE_OTHER): Payer: Self-pay | Admitting: Primary Care

## 2024-02-26 NOTE — Telephone Encounter (Signed)
 Copied from CRM [id number]. Topic: Clinical - Medication Question >> Feb 26, 2024 11:40 AM Kevin Lambert wrote: Reason for CRM: Pt has questions regarding Ozempic , wants to speak to PCP/Clinic   Best contact: [phone]  Pt called and is confused because he was told to throw Pens away after use. Pt wants to know ho many times he can use pen before he can throw it away.

## 2024-02-27 ENCOUNTER — Encounter: Payer: Self-pay | Admitting: Internal Medicine

## 2024-02-27 ENCOUNTER — Encounter (INDEPENDENT_AMBULATORY_CARE_PROVIDER_SITE_OTHER): Payer: Self-pay | Admitting: Primary Care

## 2024-02-28 ENCOUNTER — Ambulatory Visit (HOSPITAL_COMMUNITY)

## 2024-02-28 ENCOUNTER — Encounter: Payer: Self-pay | Admitting: Internal Medicine

## 2024-02-29 ENCOUNTER — Ambulatory Visit (HOSPITAL_COMMUNITY)
Admission: RE | Admit: 2024-02-29 | Discharge: 2024-02-29 | Disposition: A | Discharge status: Home / Self Care | Source: Ambulatory Visit | Attending: Urology | Admitting: Urology

## 2024-02-29 DIAGNOSIS — C3491 Malignant neoplasm of unspecified part of right bronchus or lung: Secondary | ICD-10-CM | POA: Insufficient documentation

## 2024-03-06 ENCOUNTER — Other Ambulatory Visit: Payer: Self-pay | Admitting: Urology

## 2024-03-06 ENCOUNTER — Telehealth: Payer: Self-pay

## 2024-03-06 MED ORDER — BENZONATATE 200 MG PO CAPS: 200.0000 mg | ORAL_CAPSULE | Freq: Three times a day (TID) | ORAL | 1 refills | Status: AC | PRN

## 2024-03-06 MED ORDER — GUAIFENESIN-CODEINE 100-10 MG/5ML PO SOLN: 5.0000 mL | Freq: Every evening | ORAL | 1 refills | Status: AC | PRN

## 2024-03-06 NOTE — Telephone Encounter (Signed)
 Patient identity verified x2.  Patient called stating that his cough had improved w/ the use of his rx cough meds. He is now out and & the cough is trying to linger. He would like a refill if possible. Tessalon  Perles & Codeine /Guaifenesin , to pharmacy on file. I made him no promises on the refill but told him I would pass this request along to his Rad/Onc team. Patient verbalized understanding.  This concludes the interaction.  Avery Bodo, LPN

## 2024-03-12 NOTE — Progress Notes (Signed)
 Telephone nursing appointment for review of most recent CT-Chest results. I verified patient's sister Cinda's identity x2 and began nursing interview.    Reported issues as follows... -Pain: Denies -Fatigue: Denies -Chest: Denies -Lungs: Denies -Cardiac: Denies any current issue -Skin: Denies -Appetite: Denies   Patient denies any other related issues at this time.   Meaningful use complete.   Patient aware of their telephone appointment w/ Ashlyn Bruning PA-C. I left my extension [phone] in case patient needs anything. Patient verbalized understanding. This concludes the nursing interview.   Patient preferred phone # [phone]    Avery Bodo, LPN

## 2024-03-13 ENCOUNTER — Ambulatory Visit
Admission: RE | Admit: 2024-03-13 | Discharge: 2024-03-13 | Disposition: A | Discharge status: Home / Self Care | Source: Ambulatory Visit | Attending: Urology | Admitting: Urology

## 2024-03-13 ENCOUNTER — Encounter: Payer: Self-pay | Admitting: Urology

## 2024-03-13 DIAGNOSIS — C3491 Malignant neoplasm of unspecified part of right bronchus or lung: Secondary | ICD-10-CM

## 2024-03-13 NOTE — Progress Notes (Signed)
 Radiation Oncology notes faxed to Abby Hocking, NN at [facility] at [phone].

## 2024-03-13 NOTE — Addendum Note (Signed)
 Encounter addended by: Keitha Pata, PA-C on: 03/13/2024 11:49 AM  Actions taken: Clinical Note Signed

## 2024-03-13 NOTE — Progress Notes (Addendum)
 Radiation Oncology         [phone]) [phone] ________________________________  Name: Kevin Lambert. 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  Narrative: At the patient's request, I spoke with his sister, Kevin Lambert, to conduct the routine scheduled post-treatment follow up visit via telephone to spare the patient unnecessary potential exposure in the healthcare setting.  The patient was notified in advance and gave permission to proceed with this visit format.  At the patient's request, I spoke with his sister, Kevin Lambert, who reports that Kevin Lambert is doing very well.  He tolerated the radiation treatments very well with a mild dry cough, mild esophagitis and modest fatigue.                           On review of systems, obtained via his sister, the patient has been doing very well and is currently without complaints.  The esophagitis and fatigue have completely resolved.  He has had a persistent, dry, hacking cough that is gradually improving.  He has been using Tessalon  Perles during the day and Hycodan syrup at night to help him rest.  He has not had any hemoptysis, fever, chills, nausea or vomiting.  Overall, she and the patient feel that he is doing very well and are quite pleased with his progress.    He had a recent posttreatment CT chest scan on 02/29/2024 that shows the treated masslike lesion in the RUL lung is smaller and there are no new lesions or lymphadenopathy to suggest disease progression.  We reviewed these results by telephone today.  ALLERGIES:  is allergic to metformin and related, naproxen, and oxycontin  [oxycodone ].  Meds: Current Outpatient  Medications  Medication Sig Dispense Refill   amLODipine  (NORVASC ) 10 MG tablet Take 1 tablet (10 mg total) by mouth daily. 90 tablet 1   aspirin EC 81 MG tablet Take 81 mg by mouth daily.     atorvastatin  (LIPITOR) 10 MG tablet Take 1 tablet (10 mg total) by mouth daily. 90 tablet 1   benzonatate  (TESSALON ) 200 MG capsule Take 1 capsule (200 mg total) by mouth 3 (three) times daily as needed for cough. 30 capsule 1   blood glucose meter kit and supplies Dispense based on patient and insurance preference. Patient to check blood sugars daily, prior to eating and also as needed (FOR ICD-10 E10.9, E11.9). 1 each 0   carvedilol  (COREG ) 3.125 MG tablet Take 1 tablet (3.125 mg total) by mouth 2 (two) times daily with a meal. 180 tablet 1   chlorthalidone  (HYGROTON ) 25 MG tablet Take 1 tablet (25 mg total) by mouth daily. 90 tablet 1   cyclobenzaprine  (FLEXERIL ) 10 MG tablet Take 1 tablet (10 mg total) by mouth at bedtime as needed for muscle spasms. 30 tablet 2   diclofenac Sodium (VOLTAREN) 1 % GEL Apply topically.     empagliflozin  (JARDIANCE ) 25 MG TABS tablet Take 1 tablet (25 mg total) by mouth daily before breakfast. 90 tablet 1   guaiFENesin -codeine  100-10 MG/5ML syrup Take 5 mLs by mouth at bedtime as  needed for cough. 118 mL 1   insulin  regular (HUMULIN R ) 100 units/mL injection Inject 0.1 mLs (10 Units total) into the skin 2 (two) times daily before a meal. 10 mL 11   ketoconazole  (NIZORAL ) 2 % cream Apply 1 Application topically daily. 60 g 2   nicotine  (NICODERM CQ  - DOSED IN MG/24 HOURS) 14 mg/24hr patch Place 14 mg onto the skin daily.     omeprazole  (PRILOSEC) 20 MG capsule Take 1 capsule (20 mg total) by mouth daily. 90 capsule 1   Semaglutide ,0.25 or 0.5MG /DOS, 2 MG/3ML SOPN Inject 0.25 mg into the skin once a week. (Patient not taking: Reported on 07/05/2023) 3 mL 2   sildenafil  (VIAGRA ) 100 MG tablet TAKE ONE TABLET BY MOUTH AS INSTRUCTED (TAKE 1 HOUR PRIOR TO SEXUAL ACTIVITY *DO NOT  EXCEED 1 DOSE PER 24 HOUR PERIOD*) 10 tablet 5   No current facility-administered medications for this encounter.    Physical Findings:  vitals were not taken for this visit.  Pain Assessment Pain Score: 0-No pain/10 Unable to assess due to telephone follow-up visit format.  Lab Findings: Lab Results  Component Value Date   WBC 6.0 10/13/2023   HGB 13.9 10/13/2023   HCT 39.9 10/13/2023   MCV 92.4 10/13/2023   PLT 182 10/13/2023     Radiographic Findings: CT Chest Wo Contrast Result Date: 03/12/2024 CLINICAL DATA:  Restaging non-small cell lung cancer. * Tracking Code: BO * EXAM: CT CHEST WITHOUT CONTRAST TECHNIQUE: Multidetector CT imaging of the chest was performed following the standard protocol without IV contrast. RADIATION DOSE REDUCTION: This exam was performed according to the departmental dose-optimization program which includes automated exposure control, adjustment of the mA and/or kV according to patient size and/or use of iterative reconstruction technique. COMPARISON:  Multiple prior imaging studies. The most recent CT scan is 10/13/2023 and the most recent PET-CT is 11/17/2023 FINDINGS: Cardiovascular: The heart is normal in size and stable. Stable significant age advanced atherosclerotic calcification involving the aorta and branch vessels including extensive three-vessel coronary artery calcifications. Mediastinum/Nodes: No mediastinal or hilar mass or lymphadenopathy. Small scattered lymph nodes are stable. The esophagus is grossly normal. Lungs/Pleura: Persistent large masslike area of consolidation in the right upper lobe adjacent to the major fissure. The lesion had a much more rounded appearance on the prior chest CT with a maximum AP diameter of 3.3 cm. This is now 2.4 cm. It is slightly more elongated in appearance with a transverse measurement of 5 cm which was previously 4.8 cm. Some surrounding interstitial thickening is slightly progressive and likely radiation  related. No new pulmonary lesions or pulmonary nodules. No acute pulmonary process. No pleural effusions or pleural nodules. Upper Abdomen: No significant upper abdominal findings. Stable bilateral adrenal gland lesions consistent with benign adenomas. No hepatic lesions. No upper abdominal adenopathy. Stable advanced aortic calcifications. Musculoskeletal: No chest wall mass, supraclavicular or axillary adenopathy. The bony thorax is intact. Stable advanced degenerative changes involving the thoracic spine. IMPRESSION: 1. Persistent large masslike area of consolidation in the right upper lobe adjacent to the major fissure. This has decreased in volume since the prior PET-CT suggesting a positive response to treatment. 2. No new pulmonary lesions or pulmonary nodules. 3. No mediastinal or hilar mass or adenopathy. 4. Stable bilateral adrenal gland lesions consistent with benign adenomas. 5. Stable significant age advanced atherosclerotic calcification involving the aorta and branch vessels including extensive three-vessel coronary artery calcifications. 6. Aortic atherosclerosis. Aortic Atherosclerosis (ICD10-I70.0). Electronically Signed   By: Arlester Ladd.  Gallerani M.D.   On: 03/12/2024 11:40    Impression/Plan: 1. 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 He appears to have recovered well from the effects of the recent SBRT and is currently without complaints.  His recent posttreatment CT chest scan from 02/29/2024 shows the treated masslike lesion in the RUL lung is smaller and there are no new lesions or lymphadenopathy to suggest disease progression.  Therefore, we will proceed with serial CT chest imaging, every 6 months to continue to monitor for any evidence of disease recurrence or progression.  This can be done through our office or through the [facility].  We will reach out to the [facility] to see what their preference is.  Of course, we would be more than happy  to continue to participate in his care and would plan to follow-up by telephone following each CT chest scan, to review results and recommendations.  He and his sister know that they are welcome to call at anytime with any questions or concerns in the interim.   I personally spent 30 minutes in this encounter including chart review, reviewing radiological studies, telephone conversation with the patient, entering orders and completing documentation.    Arta Bihari, PA-C

## 2024-03-14 ENCOUNTER — Telehealth: Payer: Self-pay

## 2024-03-14 NOTE — Telephone Encounter (Signed)
 Patient called about a missed call. Identity verified.  I informed patient of the information below as per Ashlyn Bruning PA-C....   ...Aaron Aas"The treated masslike lesion in the RUL lung is smaller and there are no new lesions or lymphadenopathy to suggest disease progression on his recent follow up CT Chest scan. The plan is to repeat a CT Chest scan in 6 months to continue to monitor for any evidence of disease recurrence or progression. This can be done through our office or through the [facility] so [place], lung navigator, was going to reach out to the [facility] to see what their preference is."  Patient verbalized understanding and is happy w/ this news. Reminded him to keep up with his follow up apts/ scans as discussed.  This concludes the interaction.  Avery Bodo, LPN

## 2024-03-18 ENCOUNTER — Ambulatory Visit (INDEPENDENT_AMBULATORY_CARE_PROVIDER_SITE_OTHER): Admitting: Primary Care

## 2024-03-19 ENCOUNTER — Telehealth: Payer: Self-pay

## 2024-03-19 ENCOUNTER — Telehealth: Payer: Self-pay | Admitting: Urology

## 2024-03-19 ENCOUNTER — Other Ambulatory Visit (HOSPITAL_COMMUNITY): Payer: Self-pay | Admitting: Internal Medicine

## 2024-03-19 ENCOUNTER — Telehealth (INDEPENDENT_AMBULATORY_CARE_PROVIDER_SITE_OTHER): Payer: Self-pay | Admitting: Primary Care

## 2024-03-19 DIAGNOSIS — K769 Liver disease, unspecified: Secondary | ICD-10-CM

## 2024-03-19 NOTE — Telephone Encounter (Signed)
 Copied from CRM [id number]. Topic: General - Other >> Mar 19, 2024 10:39 AM Juluis Ok wrote: Reason for CRM: Moira Andrews with [facility] calling to inform provider that patient has a cough that is worsening x 2 weeks. States patient was prescribed medications, codeine  and tessalon  pearls. States patient completed radiation to RT lung by SBRT technique from 01/02/24-01/12/24.  Callback # [phone]

## 2024-03-19 NOTE — Telephone Encounter (Signed)
 Returned patient call in reference to a lingering cough. Patient cough worsening in the last 7-10 days post usage of Codeine /Guaifenesin  and Tessalon  Perles x2. I have notified patient's PCP Marius Siemens, NP, who he has an appointment w/ on 03/20/2024. Patient states "He will have his PCP address this issue and go from there."  This concludes the interaction.  Avery Bodo, LPN

## 2024-03-19 NOTE — Telephone Encounter (Signed)
 Called pt to confirm appt. Pt will be present.

## 2024-03-19 NOTE — Telephone Encounter (Signed)
 Will forward to provider

## 2024-03-19 NOTE — Telephone Encounter (Signed)
 5/13 Received call from patient requesting refill for cough medicine -Guaifenesin  Codeine .  Secure chat sent to [facility], so they are aware.

## 2024-03-20 ENCOUNTER — Ambulatory Visit (INDEPENDENT_AMBULATORY_CARE_PROVIDER_SITE_OTHER): Payer: Self-pay | Admitting: Primary Care

## 2024-03-20 ENCOUNTER — Encounter: Payer: Self-pay | Admitting: Internal Medicine

## 2024-03-20 ENCOUNTER — Encounter (INDEPENDENT_AMBULATORY_CARE_PROVIDER_SITE_OTHER): Payer: Self-pay | Admitting: Primary Care

## 2024-03-20 VITALS — BP 111/75 | HR 82 | Resp 16 | Ht 67.0 in | Wt 230.2 lb

## 2024-03-20 DIAGNOSIS — N1831 Chronic kidney disease, stage 3a: Secondary | ICD-10-CM

## 2024-03-20 DIAGNOSIS — E1169 Type 2 diabetes mellitus with other specified complication: Secondary | ICD-10-CM

## 2024-03-20 DIAGNOSIS — E119 Type 2 diabetes mellitus without complications: Secondary | ICD-10-CM

## 2024-03-20 DIAGNOSIS — E785 Hyperlipidemia, unspecified: Secondary | ICD-10-CM

## 2024-03-20 DIAGNOSIS — Z7985 Long-term (current) use of injectable non-insulin antidiabetic drugs: Secondary | ICD-10-CM

## 2024-03-20 DIAGNOSIS — Z7984 Long term (current) use of oral hypoglycemic drugs: Secondary | ICD-10-CM

## 2024-03-20 DIAGNOSIS — E1122 Type 2 diabetes mellitus with diabetic chronic kidney disease: Secondary | ICD-10-CM

## 2024-03-20 DIAGNOSIS — Z760 Encounter for issue of repeat prescription: Secondary | ICD-10-CM

## 2024-03-20 DIAGNOSIS — Z794 Long term (current) use of insulin: Secondary | ICD-10-CM

## 2024-03-20 LAB — POCT GLYCOSYLATED HEMOGLOBIN (HGB A1C): HbA1c, POC (controlled diabetic range): 9.6 % — AB (ref 0.0–7.0)

## 2024-03-20 MED ORDER — SEMAGLUTIDE (1 MG/DOSE) 4 MG/3ML ~~LOC~~ SOPN: 1.0000 mg | PEN_INJECTOR | SUBCUTANEOUS | 1 refills | Status: DC

## 2024-03-20 MED ORDER — BLOOD GLUCOSE TEST VI STRP: 1.0000 | ORAL_STRIP | Freq: Three times a day (TID) | 2 refills | Status: AC

## 2024-03-20 MED ORDER — ATORVASTATIN CALCIUM 80 MG PO TABS: 80.0000 mg | ORAL_TABLET | Freq: Every day | ORAL | 1 refills | Status: AC

## 2024-03-20 MED ORDER — BLOOD GLUCOSE MONITORING SUPPL DEVI: 1.0000 | Freq: Three times a day (TID) | 0 refills | Status: AC

## 2024-03-20 MED ORDER — LANCETS MISC. MISC: 1.0000 | Freq: Three times a day (TID) | 3 refills | Status: AC

## 2024-03-20 NOTE — Progress Notes (Signed)
 [facility]  Kevin Lambert, is a 68 y.o. male  ZOX:[id number]  [id number]:[id number]  DOB - 02/02/1956  Chief Complaint  Patient presents with   Diabetes    Pt needs a new glucose meter and supplies.       Subjective:   Kevin Lambert is a 68 y.o. male here today for a follow up visit T2D. Denies polyuria, polydipsia, polyphasia or vision changes.  Does not check blood sugars at home.    No problems updated.  Comprehensive ROS Pertinent positive and negative noted in HPI   Allergies  Allergen Reactions   Metformin And Related Other (See Comments)    Severe muscle spasms   Naproxen Other (See Comments)    Pt states it have him off balance.   Oxycontin  [Oxycodone ] Hives    Past Medical History:  Diagnosis Date   Arthritis    Chronic kidney disease    Depression    DM (diabetes mellitus) (HCC)    type II   Dyslipidemia    Dyspnea    unable to walk much - he thinks it is from    GERD (gastroesophageal reflux disease)    Hepatitis    Hepatitis C- treated   History of kidney stones    HTN (hypertension)    Lung cancer (HCC)    Neuropathy    nscl ca dx'd 10/2019   OSA on CPAP    PTSD (post-traumatic stress disorder)     Current Outpatient Medications on File Prior to Visit  Medication Sig Dispense Refill   amLODipine  (NORVASC ) 10 MG tablet Take 1 tablet (10 mg total) by mouth daily. 90 tablet 1   benzonatate  (TESSALON ) 200 MG capsule Take 1 capsule (200 mg total) by mouth 3 (three) times daily as needed for cough. 30 capsule 1   blood glucose meter kit and supplies Dispense based on patient and insurance preference. Patient to check blood sugars daily, prior to eating and also as needed (FOR ICD-10 E10.9, E11.9). 1 each 0   carvedilol  (COREG ) 3.125 MG tablet Take 1 tablet (3.125 mg total) by mouth 2 (two) times daily with a meal. 180 tablet 1   chlorthalidone  (HYGROTON ) 25 MG tablet Take 1 tablet (25 mg total) by mouth daily. 90 tablet 1   cyclobenzaprine   (FLEXERIL ) 10 MG tablet Take 1 tablet (10 mg total) by mouth at bedtime as needed for muscle spasms. 30 tablet 2   diclofenac Sodium (VOLTAREN) 1 % GEL Apply topically.     empagliflozin  (JARDIANCE ) 25 MG TABS tablet Take 1 tablet (25 mg total) by mouth daily before breakfast. 90 tablet 1   guaiFENesin -codeine  100-10 MG/5ML syrup Take 5 mLs by mouth at bedtime as needed for cough. 118 mL 1   insulin  regular (HUMULIN R ) 100 units/mL injection Inject 0.1 mLs (10 Units total) into the skin 2 (two) times daily before a meal. 10 mL 11   ketoconazole  (NIZORAL ) 2 % cream Apply 1 Application topically daily. 60 g 2   nicotine  (NICODERM CQ  - DOSED IN MG/24 HOURS) 14 mg/24hr patch Place 14 mg onto the skin daily.     omeprazole  (PRILOSEC) 20 MG capsule Take 1 capsule (20 mg total) by mouth daily. 90 capsule 1   Semaglutide ,0.25 or 0.5MG /DOS, 2 MG/3ML SOPN Inject 0.25 mg into the skin once a week. 3 mL 2   sildenafil  (VIAGRA ) 100 MG tablet TAKE ONE TABLET BY MOUTH AS INSTRUCTED (TAKE 1 HOUR PRIOR TO SEXUAL ACTIVITY *DO NOT EXCEED 1 DOSE PER 24  HOUR PERIOD*) 10 tablet 5   aspirin EC 81 MG tablet Take 81 mg by mouth daily. (Patient not taking: Reported on 03/20/2024)     No current facility-administered medications on file prior to visit.   Health Maintenance  Topic Date Due   Eye exam for diabetics  Never done   Colon Cancer Screening  Never done   COVID-19 Vaccine (3 - Pfizer risk series) 01/28/2021   Pneumonia Vaccine (1 of 2 - PCV) 12/18/2024*   Flu Shot  06/07/2024   Hemoglobin A1C  06/17/2024   Medicare Annual Wellness Visit  08/27/2024   Yearly kidney function blood test for diabetes  12/18/2024   Yearly kidney health urinalysis for diabetes  12/18/2024   Complete foot exam   03/20/2025   DTaP/Tdap/Td vaccine (2 - Td or Tdap) 07/14/2027   Hepatitis C Screening  Completed   Zoster (Shingles) Vaccine  Completed   HPV Vaccine  Aged Out   Meningitis B Vaccine  Aged Out  *Topic was postponed. The  date shown is not the original due date.    Objective:   Vitals:   03/20/24 1428  BP: 111/75  Pulse: 82  Resp: 16  SpO2: 95%  Weight: 230 lb 3.2 oz (104.4 kg)  Height: 5\' 7"  (1.702 m)   BP Readings from Last 3 Encounters:  03/20/24 111/75  02/22/24 130/76  02/08/24 125/79   Physical Exam Vitals reviewed.  Constitutional:      Appearance: He is obese.  HENT:     Head: Normocephalic.     Right Ear: Tympanic membrane and external ear normal.     Left Ear: Tympanic membrane and external ear normal.     Nose: Nose normal.  Eyes:     Extraocular Movements: Extraocular movements intact.     Pupils: Pupils are equal, round, and reactive to light.  Cardiovascular:     Rate and Rhythm: Normal rate and regular rhythm.  Pulmonary:     Effort: Pulmonary effort is normal.     Breath sounds: Normal breath sounds.  Abdominal:     General: Bowel sounds are normal. There is distension.     Palpations: Abdomen is soft.  Musculoskeletal:        General: Normal range of motion.  Skin:    General: Skin is warm and dry.  Neurological:     Mental Status: He is oriented to person, place, and time.  Psychiatric:        Mood and Affect: Mood normal.        Behavior: Behavior normal.        Thought Content: Thought content normal.        Judgment: Judgment normal.     Assessment & Plan  Dhir was seen today for diabetes.  Diagnoses and all orders for this visit:   Type 2 diabetes mellitus with stage 3a chronic kidney disease, without long-term current use of insulin  (HCC)  A1C - educated on lifestyle modifications, including but not limited to diet choices and adding exercise to daily routine.     Hyperlipidemia associated with type 2 diabetes mellitus (HCC) -     atorvastatin  (LIPITOR) 80 MG tablet; Take 1 tablet (80 mg total) by mouth daily.  Other orders 2/2 Medication refill -     Blood Glucose Monitoring Suppl DEVI; 1 each by Does not apply route in the morning, at noon,  and at bedtime. May substitute to any manufacturer covered by patient's insurance. -     Glucose  Blood (BLOOD GLUCOSE TEST STRIPS) STRP; 1 each by In Vitro route in the morning, at noon, and at bedtime. May substitute to any manufacturer covered by patient's insurance. -     Lancets Misc. MISC; 1 each by Does not apply route in the morning, at noon, and at bedtime. May substitute to any manufacturer covered by patient's insurance.    Patient have been counseled extensively about nutrition and exercise. Other issues discussed during this visit include: low cholesterol diet, weight control and daily exercise, foot care, annual eye examinations at Ophthalmology, importance of adherence with medications and regular follow-up. We also discussed long term complications of uncontrolled diabetes and hypertension.   Return in about 3 months (around 06/20/2024) for fasting labs/dm.  The patient was given clear instructions to go to ER or return to medical center if symptoms don't improve, worsen or new problems develop. The patient verbalized understanding. The patient was told to call to get lab results if they haven't heard anything in the next week.   This note has been created with Education officer, environmental. Any transcriptional errors are unintentional.   Marius Siemens, NP 03/20/2024, 2:56 PM

## 2024-03-20 NOTE — Telephone Encounter (Signed)
Seen pt today

## 2024-03-26 ENCOUNTER — Encounter: Payer: Self-pay | Admitting: Internal Medicine

## 2024-03-26 ENCOUNTER — Telehealth: Payer: Self-pay | Admitting: Medical Oncology

## 2024-03-26 NOTE — Telephone Encounter (Signed)
 Patient reports a persistent cough that is affecting his ability to sleep. The radiation oncologist advised her to follow up with Dr. Marguerita Shih. He mentions having a small amount of phlegm with a hint of blood.  Pt requests an appt with Dr. Marguerita Shih .  LVM for Abby Hocking , RN [facility] to return my call regarding pts complaint. She works @ [employer].

## 2024-03-27 ENCOUNTER — Ambulatory Visit (HOSPITAL_COMMUNITY)

## 2024-03-27 ENCOUNTER — Encounter: Payer: Self-pay | Admitting: Internal Medicine

## 2024-03-27 NOTE — Telephone Encounter (Signed)
 Received call from pt stating that he has a persistent cough & it is is bad & prevents him from sleeping. He feels that his cough is getting worse.  He was told that it was an effect of his radiation but should be better by now with medications given.   Received call from Andrea/[facility] & she will call pt to see if he wants to come there & if so will get him in.  She will call back & let us  know if we need to get in with Dr Marguerita Shih.

## 2024-03-29 ENCOUNTER — Telehealth: Payer: Self-pay | Admitting: Pharmacist

## 2024-03-29 ENCOUNTER — Other Ambulatory Visit: Payer: Self-pay | Admitting: Urology

## 2024-03-29 DIAGNOSIS — C3491 Malignant neoplasm of unspecified part of right bronchus or lung: Secondary | ICD-10-CM

## 2024-03-29 DIAGNOSIS — C3411 Malignant neoplasm of upper lobe, right bronchus or lung: Secondary | ICD-10-CM

## 2024-03-29 NOTE — Telephone Encounter (Signed)
 Hey friends,   Can we schedule this patient for follow-up with me in 1 month? Patient is a [facility] diabetes patient.

## 2024-04-04 ENCOUNTER — Ambulatory Visit (HOSPITAL_COMMUNITY)
Admission: RE | Admit: 2024-04-04 | Discharge: 2024-04-04 | Disposition: A | Discharge status: Home / Self Care | Source: Ambulatory Visit | Attending: Internal Medicine | Admitting: Internal Medicine

## 2024-04-04 DIAGNOSIS — K769 Liver disease, unspecified: Secondary | ICD-10-CM

## 2024-04-04 MED ORDER — GADOBUTROL 1 MMOL/ML IV SOLN
10.0000 mL | Freq: Once | INTRAVENOUS | Status: AC | PRN
  Administered 2024-04-04: 10 mL via INTRAVENOUS

## 2024-04-10 ENCOUNTER — Encounter: Payer: Self-pay | Admitting: Internal Medicine

## 2024-05-06 ENCOUNTER — Ambulatory Visit (INDEPENDENT_AMBULATORY_CARE_PROVIDER_SITE_OTHER): Payer: Self-pay | Admitting: Podiatry

## 2024-05-06 DIAGNOSIS — Z91199 Patient's noncompliance with other medical treatment and regimen due to unspecified reason: Secondary | ICD-10-CM

## 2024-05-06 NOTE — Progress Notes (Signed)
 Cancel 24 hours

## 2024-05-14 ENCOUNTER — Encounter: Payer: Self-pay | Admitting: Internal Medicine

## 2024-05-22 ENCOUNTER — Encounter: Payer: Self-pay | Admitting: Internal Medicine

## 2024-05-29 ENCOUNTER — Encounter: Payer: Self-pay | Admitting: Internal Medicine

## 2024-05-29 ENCOUNTER — Ambulatory Visit (INDEPENDENT_AMBULATORY_CARE_PROVIDER_SITE_OTHER): Payer: Medicare (Managed Care) | Admitting: Podiatry

## 2024-05-29 ENCOUNTER — Encounter: Payer: Self-pay | Admitting: Podiatry

## 2024-05-29 DIAGNOSIS — M79674 Pain in right toe(s): Secondary | ICD-10-CM

## 2024-05-29 DIAGNOSIS — M79675 Pain in left toe(s): Secondary | ICD-10-CM

## 2024-05-29 DIAGNOSIS — E1165 Type 2 diabetes mellitus with hyperglycemia: Secondary | ICD-10-CM | POA: Diagnosis not present

## 2024-05-29 DIAGNOSIS — B351 Tinea unguium: Secondary | ICD-10-CM | POA: Diagnosis not present

## 2024-05-29 MED ORDER — KETOCONAZOLE 2 % EX CREA: 1.0000 | TOPICAL_CREAM | Freq: Every day | CUTANEOUS | 2 refills | Status: DC

## 2024-05-29 NOTE — Progress Notes (Signed)
  Subjective:  Patient ID: Kevin Lambert., male    DOB: 1956/03/09,   MRN: [id number]  Chief Complaint  Patient presents with   Diabetes    I am here for toe nail clipping Saw [name], NP in May 2025 A1c 9.8    68 y.o. male presents for concern of thickened elongated and painful nails that are difficult to trim. Requesting to have them trimmed today. Relates burning and tingling in their feet. Patient is diabetic and last A1c was  Lab Results  Component Value Date   HGBA1C 9.6 (A) 03/20/2024   .   PCP:  Bohr Rosaline SQUIBB, NP    . Denies any other pedal complaints. Denies n/v/f/c.   Past Medical History:  Diagnosis Date   Arthritis    Chronic kidney disease    Depression    DM (diabetes mellitus) (HCC)    type II   Dyslipidemia    Dyspnea    unable to walk much - he thinks it is from    GERD (gastroesophageal reflux disease)    Hepatitis    Hepatitis C- treated   History of kidney stones    HTN (hypertension)    Lung cancer (HCC)    Neuropathy    nscl ca dx'd 10/2019   OSA on CPAP    PTSD (post-traumatic stress disorder)     Objective:  Physical Exam: Vascular: DP/PT pulses 1/4 bilateral. CFT <3 seconds. Absent hair growth on digits. Edema noted to bilateral lower extremities. Xerosis noted bilaterally.  Skin. No lacerations or abrasions bilateral feet. Nails 1-5 bilateral  are thickened discolored and elongated with subungual debris.  Musculoskeletal: MMT 5/5 bilateral lower extremities in DF, PF, Inversion and Eversion. Deceased ROM in DF of ankle joint.  Neurological: Sensation intact to light touch. Protective sensation diminished bilateral.    Assessment:   1. Pain due to onychomycosis of toenails of both feet   2. Type 2 diabetes mellitus with hyperglycemia, without long-term current use of insulin  (HCC)        Plan:  Patient was evaluated and treated and all questions answered. -Discussed and educated patient on diabetic foot care,  especially with  regards to the vascular, neurological and musculoskeletal systems.  -Stressed the importance of good glycemic control and the detriment of not  controlling glucose levels in relation to the foot. -Discussed supportive shoes at all times and checking feet regularly.   -Mechanically debrided all nails 1-5 bilateral using sterile nail nipper and filed with dremel without incident  -Ketoconazole  sent to pharmacy at patient request.  -Answered all patient questions -Patient to return  in 3 months for at risk foot care -Patient advised to call the office if any problems or questions arise in the meantime.   [name], DPM

## 2024-06-04 ENCOUNTER — Telehealth: Payer: Self-pay

## 2024-06-04 ENCOUNTER — Telehealth: Payer: Self-pay | Admitting: Podiatry

## 2024-06-04 NOTE — Telephone Encounter (Signed)
 On 06/03/2024, patient received a call from us  (do not remember who it was) regarding a prescription from their 05/29/2024 visit, stating it could not be filled due to them not being in the system.  Upon looking at his account, it was determined that the patient does not have [facility] authorization to be seen at our facility. This was explained to the patient, along with clarification that the outstanding balance of $105 + $35 from the 7/23 visit is due to these appointments being billed to their secondary insurance, which does not cover the previous charges without prior [place] authorization.  [facility] will contact [facility]

## 2024-06-04 NOTE — Telephone Encounter (Signed)
 Received a fax notification from the [facility] [phone], fext [phone] or [phone]  [phone] Patient cannot have outside prescriptions filled at the [facility] - which doesn't seem right  This patient does not have authorization for community [facility]. Please send prescription to an outside non [facility] pharmacy No medication was listed, but the only thing you prescribed was the ketoconazole  cream

## 2024-06-05 ENCOUNTER — Other Ambulatory Visit: Payer: Self-pay | Admitting: Podiatry

## 2024-06-05 ENCOUNTER — Ambulatory Visit: Admitting: Urology

## 2024-06-05 MED ORDER — KETOCONAZOLE 2 % EX CREA: 1.0000 | TOPICAL_CREAM | Freq: Every day | CUTANEOUS | 2 refills | Status: AC

## 2024-06-10 DIAGNOSIS — M9904 Segmental and somatic dysfunction of sacral region: Secondary | ICD-10-CM | POA: Diagnosis not present

## 2024-06-10 DIAGNOSIS — M9905 Segmental and somatic dysfunction of pelvic region: Secondary | ICD-10-CM | POA: Diagnosis not present

## 2024-06-10 DIAGNOSIS — M5441 Lumbago with sciatica, right side: Secondary | ICD-10-CM | POA: Diagnosis not present

## 2024-06-10 DIAGNOSIS — M9903 Segmental and somatic dysfunction of lumbar region: Secondary | ICD-10-CM | POA: Diagnosis not present

## 2024-06-10 DIAGNOSIS — M9902 Segmental and somatic dysfunction of thoracic region: Secondary | ICD-10-CM | POA: Diagnosis not present

## 2024-06-11 ENCOUNTER — Encounter: Payer: Self-pay | Admitting: Internal Medicine

## 2024-06-17 ENCOUNTER — Telehealth: Payer: Self-pay | Admitting: Medical Oncology

## 2024-06-17 NOTE — Telephone Encounter (Signed)
 Leeroy called pt and told him he has to get his oncology care at the [facility]. She is setting him up to see med onc at the [place].

## 2024-06-17 NOTE — Telephone Encounter (Addendum)
 Persistent cough . Asking to see Dr. Sherrod.  LVM for Alfonso Pap to return my call . 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

## 2024-06-20 ENCOUNTER — Encounter: Payer: Self-pay | Admitting: Internal Medicine

## 2024-06-24 ENCOUNTER — Ambulatory Visit (INDEPENDENT_AMBULATORY_CARE_PROVIDER_SITE_OTHER): Admitting: Primary Care

## 2024-06-24 DIAGNOSIS — M9905 Segmental and somatic dysfunction of pelvic region: Secondary | ICD-10-CM | POA: Diagnosis not present

## 2024-06-24 DIAGNOSIS — M9904 Segmental and somatic dysfunction of sacral region: Secondary | ICD-10-CM | POA: Diagnosis not present

## 2024-06-24 DIAGNOSIS — M9903 Segmental and somatic dysfunction of lumbar region: Secondary | ICD-10-CM | POA: Diagnosis not present

## 2024-06-24 DIAGNOSIS — M9902 Segmental and somatic dysfunction of thoracic region: Secondary | ICD-10-CM | POA: Diagnosis not present

## 2024-06-24 DIAGNOSIS — M5441 Lumbago with sciatica, right side: Secondary | ICD-10-CM | POA: Diagnosis not present

## 2024-06-25 ENCOUNTER — Ambulatory Visit (INDEPENDENT_AMBULATORY_CARE_PROVIDER_SITE_OTHER): Admitting: Primary Care

## 2024-06-25 ENCOUNTER — Telehealth: Payer: Self-pay | Admitting: Primary Care

## 2024-06-25 NOTE — Telephone Encounter (Signed)
 Copied from CRM #[id number]. Topic: Appointments - Scheduling Inquiry for Clinic >> Jun 24, 2024  9:58 AM Everette C wrote: Reason for CRM: The patient would like to be contacted by a member of practice staff to continue ongoing discussions related to their appointment today 06/24/24  Please contact the patient further when possible

## 2024-07-01 DIAGNOSIS — M5441 Lumbago with sciatica, right side: Secondary | ICD-10-CM | POA: Diagnosis not present

## 2024-07-01 DIAGNOSIS — M9905 Segmental and somatic dysfunction of pelvic region: Secondary | ICD-10-CM | POA: Diagnosis not present

## 2024-07-01 DIAGNOSIS — M9902 Segmental and somatic dysfunction of thoracic region: Secondary | ICD-10-CM | POA: Diagnosis not present

## 2024-07-01 DIAGNOSIS — M9904 Segmental and somatic dysfunction of sacral region: Secondary | ICD-10-CM | POA: Diagnosis not present

## 2024-07-01 DIAGNOSIS — M9903 Segmental and somatic dysfunction of lumbar region: Secondary | ICD-10-CM | POA: Diagnosis not present

## 2024-07-02 ENCOUNTER — Telehealth: Payer: Self-pay | Admitting: *Deleted

## 2024-07-02 NOTE — Telephone Encounter (Signed)
 Called patient to inform of Ct for 07-15-24- arrival time- 3:15 pm @ [facility], no restrictions to scan, patient to receive results from Ashlyn Bruning on 07-24-24 @ 10:30 am via telephone, lvm for a return call

## 2024-07-03 ENCOUNTER — Ambulatory Visit (INDEPENDENT_AMBULATORY_CARE_PROVIDER_SITE_OTHER): Admitting: Primary Care

## 2024-07-03 ENCOUNTER — Encounter (INDEPENDENT_AMBULATORY_CARE_PROVIDER_SITE_OTHER): Payer: Self-pay | Admitting: Primary Care

## 2024-07-03 VITALS — BP 154/85 | HR 89 | Resp 14 | Ht 66.0 in | Wt 233.6 lb

## 2024-07-03 DIAGNOSIS — Z720 Tobacco use: Secondary | ICD-10-CM | POA: Diagnosis not present

## 2024-07-03 DIAGNOSIS — E1169 Type 2 diabetes mellitus with other specified complication: Secondary | ICD-10-CM | POA: Diagnosis not present

## 2024-07-03 DIAGNOSIS — E1159 Type 2 diabetes mellitus with other circulatory complications: Secondary | ICD-10-CM

## 2024-07-03 DIAGNOSIS — I152 Hypertension secondary to endocrine disorders: Secondary | ICD-10-CM

## 2024-07-03 DIAGNOSIS — I10 Essential (primary) hypertension: Secondary | ICD-10-CM

## 2024-07-03 DIAGNOSIS — Z760 Encounter for issue of repeat prescription: Secondary | ICD-10-CM

## 2024-07-03 DIAGNOSIS — Z794 Long term (current) use of insulin: Secondary | ICD-10-CM

## 2024-07-03 DIAGNOSIS — E785 Hyperlipidemia, unspecified: Secondary | ICD-10-CM | POA: Diagnosis not present

## 2024-07-03 LAB — POCT GLYCOSYLATED HEMOGLOBIN (HGB A1C): HbA1c, POC (controlled diabetic range): 7.2 % — AB (ref 0.0–7.0)

## 2024-07-03 MED ORDER — NICOTINE 7 MG/24HR TD PT24: 7.0000 mg | MEDICATED_PATCH | Freq: Every day | TRANSDERMAL | 0 refills | Status: AC

## 2024-07-03 MED ORDER — CHLORTHALIDONE 25 MG PO TABS: 25.0000 mg | ORAL_TABLET | Freq: Every day | ORAL | 1 refills | Status: AC

## 2024-07-03 MED ORDER — AMLODIPINE BESYLATE 10 MG PO TABS: 10.0000 mg | ORAL_TABLET | Freq: Every day | ORAL | 1 refills | Status: AC

## 2024-07-03 MED ORDER — SEMAGLUTIDE (1 MG/DOSE) 4 MG/3ML ~~LOC~~ SOPN
1.0000 mg | PEN_INJECTOR | SUBCUTANEOUS | 1 refills | Status: AC
Start: 2024-07-03 — End: ?

## 2024-07-03 MED ORDER — EMPAGLIFLOZIN 25 MG PO TABS: 25.0000 mg | ORAL_TABLET | Freq: Every day | ORAL | 1 refills | Status: AC

## 2024-07-03 MED ORDER — CARVEDILOL 3.125 MG PO TABS
3.1250 mg | ORAL_TABLET | Freq: Two times a day (BID) | ORAL | 1 refills | Status: AC
Start: 2024-07-03 — End: ?

## 2024-07-03 MED ORDER — NICOTINE 14 MG/24HR TD PT24
14.0000 mg | MEDICATED_PATCH | Freq: Every day | TRANSDERMAL | 0 refills | Status: AC
Start: 2024-07-03 — End: ?

## 2024-07-03 MED ORDER — NICOTINE 21 MG/24HR TD PT24: 21.0000 mg | MEDICATED_PATCH | Freq: Every day | TRANSDERMAL | 0 refills | Status: AC

## 2024-07-03 NOTE — Progress Notes (Signed)
 [facility]  Kevin Lambert, is a 68 y.o. male  RDW:[id number]  FMW:[id number]  DOB - [birth year]  Chief Complaint  Patient presents with   Diabetes       Subjective:   Kevin Lambert is a 68 y.o. male here today for a follow up visit. HTN- Patient has No headache, No chest pain, No abdominal pain - No Nausea, No new weakness tingling or numbness, No Cough - shortness of breath.  Blood pressure is elevated 154/85 he endorses taking his blood pressure medicine daily.  Patient also has stage III chronic kidney disease which is also a contributing factor to elevated blood pressure.  Today he discussed his diet and plans to change to a vegan diet to help with blood pressure and diabetes.  Type 2 diabetes. Denies polyuria, polydipsia, polyphasia or vision changes.  Does not check blood sugars at home.  He has never taken his Ozempic  due to not knowing how to administer he received the medication but was unaware it needed to be refrigerated.  Will reorder and also demonstrated how to use Ozempic  with return demonstration. He does voice concerns about having lung cancer and continues to smoke we will try NicoDerm patches.  At this appointment.   No problems updated.  Comprehensive ROS Pertinent positive and negative noted in HPI   Allergies  Allergen Reactions   Metformin And Related Other (See Comments)    Severe muscle spasms   Naproxen Other (See Comments)    Pt states it have him off balance.   Oxycontin  [Oxycodone ] Hives    Past Medical History:  Diagnosis Date   Arthritis    Chronic kidney disease    Depression    DM (diabetes mellitus) (HCC)    type II   Dyslipidemia    Dyspnea    unable to walk much - he thinks it is from    GERD (gastroesophageal reflux disease)    Hepatitis    Hepatitis C- treated   History of kidney stones    HTN (hypertension)    Lung cancer (HCC)    Neuropathy    nscl ca dx'd 10/2019   OSA on CPAP    PTSD (post-traumatic stress  disorder)     Current Outpatient Medications on File Prior to Visit  Medication Sig Dispense Refill   amLODipine  (NORVASC ) 10 MG tablet Take 1 tablet (10 mg total) by mouth daily. 90 tablet 1   atorvastatin  (LIPITOR) 80 MG tablet Take 1 tablet (80 mg total) by mouth daily. 90 tablet 1   benzonatate  (TESSALON ) 200 MG capsule Take 1 capsule (200 mg total) by mouth 3 (three) times daily as needed for cough. 30 capsule 1   blood glucose meter kit and supplies Dispense based on patient and insurance preference. Patient to check blood sugars daily, prior to eating and also as needed (FOR ICD-10 E10.9, E11.9). 1 each 0   Blood Glucose Monitoring Suppl DEVI 1 each by Does not apply route in the morning, at noon, and at bedtime. May substitute to any manufacturer covered by patient's insurance. 1 each 0   carvedilol  (COREG ) 3.125 MG tablet Take 1 tablet (3.125 mg total) by mouth 2 (two) times daily with a meal. 180 tablet 1   chlorthalidone  (HYGROTON ) 25 MG tablet Take 1 tablet (25 mg total) by mouth daily. 90 tablet 1   cyclobenzaprine  (FLEXERIL ) 10 MG tablet Take 1 tablet (10 mg total) by mouth at bedtime as needed for muscle spasms. 30 tablet 2  diclofenac Sodium (VOLTAREN) 1 % GEL Apply topically.     empagliflozin  (JARDIANCE ) 25 MG TABS tablet Take 1 tablet (25 mg total) by mouth daily before breakfast. 90 tablet 1   Glucose Blood (BLOOD GLUCOSE TEST STRIPS) STRP 1 each by In Vitro route in the morning, at noon, and at bedtime. May substitute to any manufacturer covered by patient's insurance. 270 each 2   guaiFENesin -codeine  100-10 MG/5ML syrup Take 5 mLs by mouth at bedtime as needed for cough. 118 mL 1   insulin  regular (HUMULIN R ) 100 units/mL injection Inject 0.1 mLs (10 Units total) into the skin 2 (two) times daily before a meal. 10 mL 11   ketoconazole  (NIZORAL ) 2 % cream Apply 1 Application topically daily. 60 g 2   nicotine  (NICODERM CQ  - DOSED IN MG/24 HOURS) 14 mg/24hr patch Place 14 mg  onto the skin daily.     omeprazole  (PRILOSEC) 20 MG capsule Take 1 capsule (20 mg total) by mouth daily. 90 capsule 1   Semaglutide , 1 MG/DOSE, 4 MG/3ML SOPN Inject 1 mg as directed once a week. 3 mL 1   sildenafil  (VIAGRA ) 100 MG tablet TAKE ONE TABLET BY MOUTH AS INSTRUCTED (TAKE 1 HOUR PRIOR TO SEXUAL ACTIVITY *DO NOT EXCEED 1 DOSE PER 24 HOUR PERIOD*) 10 tablet 5   aspirin EC 81 MG tablet Take 81 mg by mouth daily. (Patient not taking: Reported on 07/03/2024)     No current facility-administered medications on file prior to visit.   Health Maintenance  Topic Date Due   Eye exam for diabetics  Never done   Colon Cancer Screening  Never done   COVID-19 Vaccine (3 - Pfizer risk series) 01/28/2021   Flu Shot  06/07/2024   Medicare Annual Wellness Visit  08/27/2024   Pneumococcal Vaccine for age over 110 (1 of 2 - PCV) 12/18/2024*   Yearly kidney function blood test for diabetes  12/18/2024   Yearly kidney health urinalysis for diabetes  12/18/2024   Hemoglobin A1C  01/03/2025   Complete foot exam   03/20/2025   DTaP/Tdap/Td vaccine (2 - Td or Tdap) 07/14/2027   Hepatitis C Screening  Completed   Zoster (Shingles) Vaccine  Completed   HPV Vaccine  Aged Out   Meningitis B Vaccine  Aged Out   Hepatitis B Vaccine  Discontinued  *Topic was postponed. The date shown is not the original due date.    Objective:   Vitals:   07/03/24 1409  BP: (!) 161/80  Pulse: 89  Resp: 14  SpO2: 98%  Weight: 233 lb 9.6 oz (106 kg)  Height: 5' 6 (1.676 m)   BP Readings from Last 3 Encounters:  07/03/24 (!) 154/85  03/20/24 111/75  02/22/24 130/76      Physical Exam Vitals reviewed.  Constitutional:      Appearance: He is obese.  HENT:     Head: Normocephalic.     Right Ear: Tympanic membrane, ear canal and external ear normal.     Left Ear: Tympanic membrane, ear canal and external ear normal.     Nose: Nose normal.  Eyes:     Extraocular Movements: Extraocular movements intact.      Pupils: Pupils are equal, round, and reactive to light.  Cardiovascular:     Rate and Rhythm: Normal rate and regular rhythm.  Pulmonary:     Effort: Pulmonary effort is normal.     Breath sounds: Normal breath sounds.  Abdominal:     General: Bowel sounds  are normal. There is distension.     Palpations: Abdomen is soft.  Musculoskeletal:        General: Normal range of motion.     Cervical back: Normal range of motion and neck supple.  Skin:    General: Skin is warm and dry.  Neurological:     Mental Status: He is alert and oriented to person, place, and time.  Psychiatric:        Mood and Affect: Mood normal.        Behavior: Behavior normal.        Thought Content: Thought content normal.        Judgment: Judgment normal.     Assessment & Plan  Lanny was seen today for diabetes.  Diagnoses and all orders for this visit:  Hyperlipidemia associated with type 2 diabetes mellitus (HCC) -Criston was seen today for diabetes.  Diagnoses and all orders for this visit:  Hyperlipidemia associated with type 2 diabetes mellitus (HCC)      POCT glycosylated hemoglobin (Hb A1C) 7.2 vast improvement 9.6 - educated on lifestyle modifications, including but not limited to diet choices and adding exercise to daily routine.   -     Lipid Panel  Medication refill -     amLODipine  (NORVASC ) 10 MG tablet; Take 1 tablet (10 mg total) by mouth daily. -     carvedilol  (COREG ) 3.125 MG tablet; Take 1 tablet (3.125 mg total) by mouth 2 (two) times daily with a meal. -     chlorthalidone  (HYGROTON ) 25 MG tablet; Take 1 tablet (25 mg total) by mouth daily. -     empagliflozin  (JARDIANCE ) 25 MG TABS tablet; Take 1 tablet (25 mg total) by mouth daily before breakfast. -     Semaglutide , 1 MG/DOSE, 4 MG/3ML SOPN; Inject 1 mg as directed once a week.  Type 2 diabetes mellitus with other specified complication, with long-term current use of insulin  (HCC) -     empagliflozin  (JARDIANCE ) 25 MG TABS  tablet; Take 1 tablet (25 mg total) by mouth daily before breakfast. -     Semaglutide , 1 MG/DOSE, 4 MG/3ML SOPN; Inject 1 mg as directed once a week.  Essential hypertension BP goal - < 140/90 Explained that having normal blood pressure is the goal and medications are helping to get to goal and maintain normal blood pressure. DIET: Limit salt intake, read nutrition labels to check salt content, limit fried and high fatty foods  Avoid using multisymptom OTC cold preparations that generally contain sudafed which can rise BP. Consult with pharmacist on best cold relief products to use for persons with HTN EXERCISE Discussed incorporating exercise such as walking - 30 minutes most days of the week and can do in 10 minute intervals    -     CMP14+EGFR -     carvedilol  (COREG ) 3.125 MG tablet; Take 1 tablet (3.125 mg total) by mouth 2 (two) times daily with a meal.  Hypertension associated with type 2 diabetes mellitus (HCC) -     amLODipine  (NORVASC ) 10 MG tablet; Take 1 tablet (10 mg total) by mouth daily. -     carvedilol  (COREG ) 3.125 MG tablet; Take 1 tablet (3.125 mg total) by mouth 2 (two) times daily with a meal.    Tobacco abuse  - I have recommended complete cessation of tobacco use. I have discussed various options available for assistance with tobacco cessation including over the counter methods (Nicotine  gum, patch and lozenges). We also discussed prescription  options (Chantix , Nicotine  Inhaler / Nasal Spray). Patient is interested in pursuing prescription tobacco cessation options at this time. - Patient declines at this time.  - Less than 5 minutes spent on counseling.  21/14/7/Nicoderm patches    Patient have been counseled extensively about nutrition and exercise. Other issues discussed during this visit include: low cholesterol diet, weight control and daily exercise, foot care, annual eye examinations at Ophthalmology, importance of adherence with medications and regular  follow-up. We also discussed long term complications of uncontrolled diabetes and hypertension.     The patient was given clear instructions to go to ER or return to medical center if symptoms don't improve, worsen or new problems develop. The patient verbalized understanding. The patient was told to call to get lab results if they haven't heard anything in the next week.   This note has been created with Education officer, environmental. Any transcriptional errors are unintentional.   Rosaline SHAUNNA Bohr, NP 07/03/2024, 2:24 PM

## 2024-07-04 LAB — CMP14+EGFR
ALT: 19 IU/L (ref 0–44)
AST: 12 IU/L (ref 0–40)
Albumin: 4.1 g/dL (ref 3.9–4.9)
Alkaline Phosphatase: 82 IU/L (ref 44–121)
BUN/Creatinine Ratio: 14 (ref 10–24)
BUN: 21 mg/dL (ref 8–27)
Bilirubin Total: <0.2 mg/dL (ref 0.0–1.2)
CO2: 23 mmol/L (ref 20–29)
Calcium: 9.9 mg/dL (ref 8.6–10.2)
Chloride: 97 mmol/L (ref 96–106)
Creatinine, Ser: 1.51 mg/dL — ABNORMAL HIGH (ref 0.76–1.27)
Globulin, Total: 2.2 g/dL (ref 1.5–4.5)
Glucose: 112 mg/dL — ABNORMAL HIGH (ref 70–99)
Potassium: 4.3 mmol/L (ref 3.5–5.2)
Sodium: 137 mmol/L (ref 134–144)
Total Protein: 6.3 g/dL (ref 6.0–8.5)
eGFR: 50 mL/min/1.73 — ABNORMAL LOW (ref >59)

## 2024-07-04 LAB — LIPID PANEL
Chol/HDL Ratio: 4.1 ratio (ref 0.0–5.0)
Cholesterol, Total: 151 mg/dL (ref 100–199)
HDL: 37 mg/dL — ABNORMAL LOW (ref >39)
LDL Chol Calc (NIH): 97 mg/dL (ref 0–99)
Triglycerides: 92 mg/dL (ref 0–149)
VLDL Cholesterol Cal: 17 mg/dL (ref 5–40)

## 2024-07-09 ENCOUNTER — Ambulatory Visit (INDEPENDENT_AMBULATORY_CARE_PROVIDER_SITE_OTHER): Payer: Self-pay | Admitting: Primary Care

## 2024-07-12 ENCOUNTER — Telehealth (INDEPENDENT_AMBULATORY_CARE_PROVIDER_SITE_OTHER): Payer: Self-pay

## 2024-07-12 ENCOUNTER — Encounter: Payer: Self-pay | Admitting: Internal Medicine

## 2024-07-12 DIAGNOSIS — N184 Chronic kidney disease, stage 4 (severe): Secondary | ICD-10-CM | POA: Diagnosis not present

## 2024-07-12 NOTE — Telephone Encounter (Signed)
 Received form from [place] and call placed to patient to obtain more information on the need for the form. LVM for patient to return call.

## 2024-07-12 NOTE — Telephone Encounter (Unsigned)
 Copied from CRM [id number]. Topic: General - Other >> Jul 12, 2024  3:17 PM Tiffini S wrote: Reason for CRM: Patient called asking to be transferred to the clinic- he has received a message to call back for form from [place]- need to obtain more information on the form. Called [facility] twice, no answer.   Please call the patient back at [phone].

## 2024-07-12 NOTE — Telephone Encounter (Signed)
 Patient states that he will call back on Monday once [facility] speaks with [facility] in reference to form.  Form has been placed in black bin(tray) on CMA desk

## 2024-07-15 ENCOUNTER — Ambulatory Visit (HOSPITAL_COMMUNITY)

## 2024-07-15 ENCOUNTER — Telehealth (INDEPENDENT_AMBULATORY_CARE_PROVIDER_SITE_OTHER): Payer: Self-pay | Admitting: Primary Care

## 2024-07-15 ENCOUNTER — Telehealth: Payer: Self-pay | Admitting: Radiation Oncology

## 2024-07-15 NOTE — Telephone Encounter (Signed)
 Pt called advising he was exposed to someone with COVID so he was taking precaution and r/s appts to be safe. Pt moved CT to 9/22 and was now calling to r/s telephone f/u to review scans. Telephone f/u moved to 9/25@8 :30am.

## 2024-07-15 NOTE — Telephone Encounter (Signed)
 Pt called in stating that he needed to get in contact with office due to provider filling out paperwork correctly. Pt stated that the  [place] [phone] would like for someone to call them and fill out paperwork while on the phone with them. Please advise.

## 2024-07-24 ENCOUNTER — Ambulatory Visit: Admitting: Urology

## 2024-07-26 ENCOUNTER — Encounter: Payer: Self-pay | Admitting: Internal Medicine

## 2024-07-29 ENCOUNTER — Inpatient Hospital Stay: Attending: Radiation Oncology

## 2024-07-29 ENCOUNTER — Ambulatory Visit (HOSPITAL_COMMUNITY)
Admission: RE | Admit: 2024-07-29 | Discharge: 2024-07-29 | Disposition: A | Discharge status: Home / Self Care | Source: Ambulatory Visit | Attending: Urology | Admitting: Urology

## 2024-07-29 DIAGNOSIS — C3491 Malignant neoplasm of unspecified part of right bronchus or lung: Secondary | ICD-10-CM | POA: Insufficient documentation

## 2024-07-31 NOTE — Progress Notes (Signed)
 Telephone Visit to discuss CT results for Right Upper lung  Completed radiation treatment:  01/12/2024  Does the patient complain of any of the following: Pain:*** Shortness of breath w/wo exertion: *** Cough: *** Hemoptysis: *** Pain with swallowing: *** Swallowing/choking concerns: *** Appetite: *** Energy Level:    Additional comments if applicable:

## 2024-08-01 ENCOUNTER — Ambulatory Visit
Admission: RE | Admit: 2024-08-01 | Discharge: 2024-08-01 | Disposition: A | Discharge status: Home / Self Care | Source: Ambulatory Visit | Attending: Urology | Admitting: Urology

## 2024-08-01 ENCOUNTER — Encounter: Payer: Self-pay | Admitting: Urology

## 2024-08-01 DIAGNOSIS — C3491 Malignant neoplasm of unspecified part of right bronchus or lung: Secondary | ICD-10-CM

## 2024-08-01 DIAGNOSIS — F17210 Nicotine dependence, cigarettes, uncomplicated: Secondary | ICD-10-CM | POA: Diagnosis not present

## 2024-08-01 DIAGNOSIS — C3411 Malignant neoplasm of upper lobe, right bronchus or lung: Secondary | ICD-10-CM | POA: Diagnosis not present

## 2024-08-01 NOTE — Progress Notes (Signed)
 Radiation Oncology         [id number]) [phone] ________________________________  Name: Kevin Lambert. 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  Narrative: Today, I spoke with the patient to conduct the routine scheduled post-treatment follow up visit via telephone to spare the patient unnecessary potential exposure in the healthcare setting.  The patient was notified in advance and gave permission to proceed with this visit format.  He reports that his doing well in general.  He tolerated the radiation treatments very well with a mild dry cough, mild esophagitis and modest fatigue which have all resolved.                           On review of systems, the patient states that he has been doing very well and is currently without complaints.  The esophagitis and fatigue have completely resolved.  He has had a persistent, dry, hacking cough that is gradually improving.  He has not had any hemoptysis, fever, chills, nausea or vomiting.  Overall, he feels well and is pleased with his progress to date.  He reports that he has a post-a-cath in place that needs to be flushed.  He had a recent posttreatment CT chest scan on 07/29/2024 that shows evolving post-radiation changes surrounding the treated masslike lesion in the RUL lung and there are no new lesions or lymphadenopathy to suggest disease progression.  We reviewed these results by telephone today.  ALLERGIES:  is allergic to metformin and related, naproxen, and oxycontin  [oxycodone ].  Meds: Current Outpatient Medications  Medication Sig Dispense Refill   amLODipine  (NORVASC ) 10 MG tablet Take 1 tablet (10  mg total) by mouth daily. 90 tablet 1   aspirin EC 81 MG tablet Take 81 mg by mouth daily. (Patient not taking: Reported on 07/03/2024)     atorvastatin  (LIPITOR) 80 MG tablet Take 1 tablet (80 mg total) by mouth daily. 90 tablet 1   benzonatate  (TESSALON ) 200 MG capsule Take 1 capsule (200 mg total) by mouth 3 (three) times daily as needed for cough. 30 capsule 1   blood glucose meter kit and supplies Dispense based on patient and insurance preference. Patient to check blood sugars daily, prior to eating and also as needed (FOR ICD-10 E10.9, E11.9). 1 each 0   Blood Glucose Monitoring Suppl DEVI 1 each by Does not apply route in the morning, at noon, and at bedtime. May substitute to any manufacturer covered by patient's insurance. 1 each 0   carvedilol  (COREG ) 3.125 MG tablet Take 1 tablet (3.125 mg total) by mouth 2 (two) times daily with a meal. 180 tablet 1   chlorthalidone  (HYGROTON ) 25 MG tablet Take 1 tablet (25 mg total) by mouth daily. 90 tablet 1   cyclobenzaprine  (FLEXERIL ) 10 MG tablet Take 1 tablet (10 mg total) by mouth at bedtime as needed for muscle spasms. 30 tablet 2   diclofenac Sodium (VOLTAREN) 1 % GEL Apply topically.     empagliflozin  (JARDIANCE ) 25 MG TABS tablet Take 1 tablet (25 mg total) by mouth daily before breakfast.  90 tablet 1   Glucose Blood (BLOOD GLUCOSE TEST STRIPS) STRP 1 each by In Vitro route in the morning, at noon, and at bedtime. May substitute to any manufacturer covered by patient's insurance. 270 each 2   guaiFENesin -codeine  100-10 MG/5ML syrup Take 5 mLs by mouth at bedtime as needed for cough. 118 mL 1   insulin  regular (HUMULIN R ) 100 units/mL injection Inject 0.1 mLs (10 Units total) into the skin 2 (two) times daily before a meal. 10 mL 11   ketoconazole  (NIZORAL ) 2 % cream Apply 1 Application topically daily. 60 g 2   nicotine  (NICODERM CQ  - DOSED IN MG/24 HOURS) 14 mg/24hr patch Place 1 patch (14 mg total) onto the skin daily. 28 patch 0   nicotine   (NICODERM CQ ) 21 mg/24hr patch Place 1 patch (21 mg total) onto the skin daily. 21 patch 0   nicotine  (NICODERM CQ ) 7 mg/24hr patch Place 1 patch (7 mg total) onto the skin daily. 28 patch 0   omeprazole  (PRILOSEC) 20 MG capsule Take 1 capsule (20 mg total) by mouth daily. 90 capsule 1   Semaglutide , 1 MG/DOSE, 4 MG/3ML SOPN Inject 1 mg as directed once a week. 3 mL 1   sildenafil  (VIAGRA ) 100 MG tablet TAKE ONE TABLET BY MOUTH AS INSTRUCTED (TAKE 1 HOUR PRIOR TO SEXUAL ACTIVITY *DO NOT EXCEED 1 DOSE PER 24 HOUR PERIOD*) 10 tablet 5   No current facility-administered medications for this encounter.    Physical Findings:  vitals were not taken for this visit.   /10 Unable to assess due to telephone follow-up visit format.  Lab Findings: Lab Results  Component Value Date   WBC 6.0 10/13/2023   HGB 13.9 10/13/2023   HCT 39.9 10/13/2023   MCV 92.4 10/13/2023   PLT 182 10/13/2023     Radiographic Findings: CT Chest Wo Contrast Result Date: 08/01/2024 CLINICAL DATA:  Status post SBRT E 4 right upper lobe non-small cell lung cancer. Prior chemotherapy. Immunotherapy. * Tracking Code: BO * EXAM: CT CHEST WITHOUT CONTRAST TECHNIQUE: Multidetector CT imaging of the chest was performed following the standard protocol without IV contrast. RADIATION DOSE REDUCTION: This exam was performed according to the departmental dose-optimization program which includes automated exposure control, adjustment of the mA and/or kV according to patient size and/or use of iterative reconstruction technique. COMPARISON:  02/29/2024 FINDINGS: Cardiovascular: Right Port-A-Cath tip mid right atrium. Aortic atherosclerosis. Mild cardiomegaly, without pericardial effusion. Left main, lad, right coronary artery calcification. Pulmonary artery enlargement, outflow tract 3.1 cm. Mediastinum/Nodes: No supraclavicular adenopathy. No mediastinal or hilar adenopathy, given limitations of unenhanced CT. Lungs/Pleura: New small right  pleural effusion. Posterior right upper lobe area of masslike consolidation is increased. Example 4.7 x 3.3 cm on 43/7 versus 4.7 x 2.3 cm on the prior exam when remeasured in a similar fashion. Increased surrounding septal thickening and ground-glass. Increased presumably radiation induced septal thickening within the adjacent anterior right lower lobe. Upper Abdomen: Normal imaged portions of the liver, spleen, stomach, pancreas, gallbladder, bilateral renal vascular calcifications. Upper pole right renal 1.8 cm low-density lesion is likely a cyst . In the absence of clinically indicated signs/symptoms require(s) no independent follow-up. Bilateral adrenal adenomas including up to 2.4 cm do not warrant imaging follow-up and are unchanged. Musculoskeletal: Thoracic spondylosis. Mild S shaped thoracic spine curvature. IMPRESSION: 1. Posterior right upper lobe masslike consolidation and surrounding ground-glass/septal thickening are mildly increased. Most likely related to evolving radiation change. Given enlargement of the presumed treatment related consolidation, and  hypermetabolism on 11/17/2023 PET, residual disease cannot be excluded. Consider chest CT follow-up at 3 months versus further evaluation with repeat PET. 2. No adenopathy or other evidence of metastatic disease. 3. Pulmonary artery enlargement suggests pulmonary arterial hypertension. 4. Coronary artery atherosclerosis. Aortic Atherosclerosis (ICD10-I70.0). 5. Bilateral adrenal adenomas. Electronically Signed   By: Rockey Kilts M.D.   On: 08/01/2024 08:28    Impression/Plan: 1. 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 He appears to have recovered well from the effects of the recent SBRT and is currently without complaints.  His recent posttreatment CT chest scan from 07/29/2024 shows the treated masslike lesion in the RUL lung is stable with evolving post-radiation changes and there are  no new lesions or lymphadenopathy to suggest disease progression.  We recommend follow up CT chest imaging in 4 months to continue to monitor for any evidence of disease recurrence or progression.  In light of his history of stage IV disease, he needs to be followed by medical oncology and this can be done with Dr. Sherrod, here in [place] or through his medical oncologist at the [facility].  We will reach out to the [facility] to see what their preference is and set that up soon since he has a port-a-cath in place that needs to be flushed.  We enjoyed taking care of him and would be more than happy to continue to participate in his care if clinically indicated in the future. Otherwise, we will see him back on an as needed basis.  I personally spent 30 minutes in this encounter including chart review, reviewing radiological studies, telephone conversation with the patient, entering orders and completing documentation.    Sabra MICAEL Rusk, PA-C

## 2024-08-02 ENCOUNTER — Telehealth: Payer: Self-pay | Admitting: Medical Oncology

## 2024-08-02 NOTE — Telephone Encounter (Signed)
 I spoke with Kevin Lambert , [facility] . Kevin Lambert  was last seen  in May by [facility],   Dr. Carollee .  Kevin said  his port a cath has not been flushed at he [place]. I told her his port was flushed in jan 2025 @  the [facility] in . She will f/u with Dr Dulce re a plan for continuation of medical oncology care.  I was told he has radiation community care approved through 01/20/2025.

## 2024-08-05 ENCOUNTER — Telehealth: Payer: Self-pay | Admitting: Medical Oncology

## 2024-08-05 ENCOUNTER — Other Ambulatory Visit: Payer: Self-pay | Admitting: Medical Oncology

## 2024-08-05 DIAGNOSIS — C3490 Malignant neoplasm of unspecified part of unspecified bronchus or lung: Secondary | ICD-10-CM

## 2024-08-05 NOTE — Telephone Encounter (Signed)
 Dr. Carollee  , MD (med oncologist at the [facility])  has approved pt to receive community care with Dr. Sherrod. She will fax the authorization.  Schedule message sent for port flush this  month and every 6-8 weeks and labs, CT and f/u in 4 months.

## 2024-08-07 ENCOUNTER — Telehealth: Payer: Self-pay | Admitting: Internal Medicine

## 2024-08-07 NOTE — Telephone Encounter (Signed)
 Scheduled appointments with the patient per staff message. The patient is aware and is active on MyChart.

## 2024-08-09 ENCOUNTER — Encounter

## 2024-08-12 ENCOUNTER — Inpatient Hospital Stay: Attending: Internal Medicine

## 2024-08-16 ENCOUNTER — Telehealth: Payer: Self-pay | Admitting: Radiation Oncology

## 2024-08-16 NOTE — Telephone Encounter (Signed)
 10/9 faxed provider's notes to [facility] (fax # [phone] for 9/25 as requested by [facility] - [facility].

## 2024-08-20 ENCOUNTER — Other Ambulatory Visit: Payer: Self-pay

## 2024-08-20 ENCOUNTER — Encounter (HOSPITAL_COMMUNITY): Payer: Self-pay | Admitting: Emergency Medicine

## 2024-08-20 ENCOUNTER — Emergency Department (HOSPITAL_COMMUNITY)
Admission: EM | Admit: 2024-08-20 | Discharge: 2024-08-20 | Disposition: A | Discharge status: Home / Self Care | Attending: Emergency Medicine | Admitting: Emergency Medicine

## 2024-08-20 ENCOUNTER — Emergency Department (HOSPITAL_COMMUNITY)

## 2024-08-20 DIAGNOSIS — Z85118 Personal history of other malignant neoplasm of bronchus and lung: Secondary | ICD-10-CM | POA: Diagnosis not present

## 2024-08-20 DIAGNOSIS — Z794 Long term (current) use of insulin: Secondary | ICD-10-CM | POA: Diagnosis not present

## 2024-08-20 DIAGNOSIS — R509 Fever, unspecified: Secondary | ICD-10-CM | POA: Diagnosis not present

## 2024-08-20 DIAGNOSIS — R0789 Other chest pain: Secondary | ICD-10-CM | POA: Diagnosis not present

## 2024-08-20 DIAGNOSIS — J9811 Atelectasis: Secondary | ICD-10-CM | POA: Diagnosis not present

## 2024-08-20 DIAGNOSIS — R0602 Shortness of breath: Secondary | ICD-10-CM | POA: Insufficient documentation

## 2024-08-20 DIAGNOSIS — Z7982 Long term (current) use of aspirin: Secondary | ICD-10-CM | POA: Diagnosis not present

## 2024-08-20 DIAGNOSIS — R059 Cough, unspecified: Secondary | ICD-10-CM | POA: Diagnosis not present

## 2024-08-20 DIAGNOSIS — N281 Cyst of kidney, acquired: Secondary | ICD-10-CM | POA: Diagnosis not present

## 2024-08-20 DIAGNOSIS — J90 Pleural effusion, not elsewhere classified: Secondary | ICD-10-CM | POA: Diagnosis not present

## 2024-08-20 DIAGNOSIS — I2510 Atherosclerotic heart disease of native coronary artery without angina pectoris: Secondary | ICD-10-CM | POA: Diagnosis not present

## 2024-08-20 DIAGNOSIS — R918 Other nonspecific abnormal finding of lung field: Secondary | ICD-10-CM | POA: Diagnosis not present

## 2024-08-20 LAB — BASIC METABOLIC PANEL WITH GFR
Anion gap: 13 (ref 5–15)
BUN: 15 mg/dL (ref 8–23)
CO2: 21 mmol/L — ABNORMAL LOW (ref 22–32)
Calcium: 9.8 mg/dL (ref 8.9–10.3)
Chloride: 102 mmol/L (ref 98–111)
Creatinine, Ser: 1.5 mg/dL — ABNORMAL HIGH (ref 0.61–1.24)
GFR, Estimated: 50 mL/min — ABNORMAL LOW (ref >60)
Glucose, Bld: 117 mg/dL — ABNORMAL HIGH (ref 70–99)
Potassium: 3.8 mmol/L (ref 3.5–5.1)
Sodium: 136 mmol/L (ref 135–145)

## 2024-08-20 LAB — I-STAT CHEM 8, ED
BUN: 16 mg/dL (ref 8–23)
Calcium, Ion: 1.22 mmol/L (ref 1.15–1.40)
Chloride: 104 mmol/L (ref 98–111)
Creatinine, Ser: 1.6 mg/dL — ABNORMAL HIGH (ref 0.61–1.24)
Glucose, Bld: 116 mg/dL — ABNORMAL HIGH (ref 70–99)
HCT: 48 % (ref 39.0–52.0)
Hemoglobin: 16.3 g/dL (ref 13.0–17.0)
Potassium: 3.8 mmol/L (ref 3.5–5.1)
Sodium: 140 mmol/L (ref 135–145)
TCO2: 25 mmol/L (ref 22–32)

## 2024-08-20 LAB — CBC WITH DIFFERENTIAL/PLATELET
Abs Immature Granulocytes: 0.02 K/uL (ref 0.00–0.07)
Basophils Absolute: 0 K/uL (ref 0.0–0.1)
Basophils Relative: 1 %
Eosinophils Absolute: 0.3 K/uL (ref 0.0–0.5)
Eosinophils Relative: 7 %
HCT: 48.1 % (ref 39.0–52.0)
Hemoglobin: 15.7 g/dL (ref 13.0–17.0)
Immature Granulocytes: 1 %
Lymphocytes Relative: 23 %
Lymphs Abs: 1 K/uL (ref 0.7–4.0)
MCH: 31.2 pg (ref 26.0–34.0)
MCHC: 32.6 g/dL (ref 30.0–36.0)
MCV: 95.4 fL (ref 80.0–100.0)
Monocytes Absolute: 0.5 K/uL (ref 0.1–1.0)
Monocytes Relative: 12 %
Neutro Abs: 2.5 K/uL (ref 1.7–7.7)
Neutrophils Relative %: 56 %
Platelets: 159 K/uL (ref 150–400)
RBC: 5.04 MIL/uL (ref 4.22–5.81)
RDW: 16.2 % — ABNORMAL HIGH (ref 11.5–15.5)
WBC: 4.4 K/uL (ref 4.0–10.5)
nRBC: 0 % (ref 0.0–0.2)

## 2024-08-20 LAB — TROPONIN I (HIGH SENSITIVITY)
Troponin I (High Sensitivity): 58 ng/L — ABNORMAL HIGH (ref <18)
Troponin I (High Sensitivity): 54 ng/L — ABNORMAL HIGH (ref <18)

## 2024-08-20 LAB — BRAIN NATRIURETIC PEPTIDE: B Natriuretic Peptide: 888.4 pg/mL — ABNORMAL HIGH (ref 0.0–100.0)

## 2024-08-20 MED ORDER — AZITHROMYCIN 250 MG PO TABS: 250.0000 mg | ORAL_TABLET | Freq: Every day | ORAL | 0 refills | Status: DC

## 2024-08-20 MED ORDER — AMOXICILLIN-POT CLAVULANATE 875-125 MG PO TABS: 1.0000 | ORAL_TABLET | Freq: Two times a day (BID) | ORAL | 0 refills | Status: DC

## 2024-08-20 MED ORDER — AMOXICILLIN-POT CLAVULANATE 875-125 MG PO TABS: 1.0000 | ORAL_TABLET | Freq: Two times a day (BID) | ORAL | 0 refills | Status: AC

## 2024-08-20 MED ORDER — IOHEXOL 350 MG/ML SOLN
75.0000 mL | Freq: Once | INTRAVENOUS | Status: AC | PRN
  Administered 2024-08-20: 75 mL via INTRAVENOUS

## 2024-08-20 MED ORDER — AZITHROMYCIN 250 MG PO TABS: 250.0000 mg | ORAL_TABLET | Freq: Every day | ORAL | 0 refills | Status: AC

## 2024-08-20 NOTE — ED Provider Notes (Signed)
 Kevin Lambert EMERGENCY DEPARTMENT AT [facility] Provider Note   CSN: [id number] Arrival date & time: 08/20/24  1335     Patient presents with: Shortness of Breath   Kevin Lambert. is a 68 y.o. male.   68 yo M with a chief complaint of difficulty breathing.  This been going on for about 3 to 4 days.  He has been coughing a bit since having subjective fevers.  He is like his legs have been swollen a little bit too.  Notices it is worse when he gets up and tries to move around but also when he lays back flat at night to sleep.  He went to a [place] urgent care and they sent him here for further evaluation.  Has some chest tightness.        Prior to Admission medications   Medication Sig Start Date End Date Taking? Authorizing Provider  amLODipine  (NORVASC ) 10 MG tablet Take 1 tablet (10 mg total) by mouth daily. 07/03/24   Celestia Rosaline SQUIBB, NP  amoxicillin-clavulanate (AUGMENTIN) 875-125 MG tablet Take 1 tablet by mouth every 12 (twelve) hours. 08/20/24   Emil Share, DO  aspirin EC 81 MG tablet Take 81 mg by mouth daily. Patient not taking: Reported on 07/03/2024    [provider]  atorvastatin  (LIPITOR) 80 MG tablet Take 1 tablet (80 mg total) by mouth daily. 03/20/24   Celestia Rosaline SQUIBB, NP  azithromycin (ZITHROMAX) 250 MG tablet Take 1 tablet (250 mg total) by mouth daily. Take first 2 tablets together, then 1 every day until finished. 08/20/24   Emil Share, DO  benzonatate  (TESSALON ) 200 MG capsule Take 1 capsule (200 mg total) by mouth 3 (three) times daily as needed for cough. 03/06/24   Bruning, Ashlyn, PA-C  blood glucose meter kit and supplies Dispense based on patient and insurance preference. Patient to check blood sugars daily, prior to eating and also as needed (FOR ICD-10 E10.9, E11.9). 02/22/22   Boscia, Heather E, NP  Blood Glucose Monitoring Suppl DEVI 1 each by Does not apply route in the morning, at noon, and at bedtime. May substitute to any  manufacturer covered by patient's insurance. 03/20/24   Celestia Rosaline SQUIBB, NP  carvedilol  (COREG ) 3.125 MG tablet Take 1 tablet (3.125 mg total) by mouth 2 (two) times daily with a meal. 07/03/24   Celestia Rosaline SQUIBB, NP  chlorthalidone  (HYGROTON ) 25 MG tablet Take 1 tablet (25 mg total) by mouth daily. 07/03/24   Celestia Rosaline SQUIBB, NP  cyclobenzaprine  (FLEXERIL ) 10 MG tablet Take 1 tablet (10 mg total) by mouth at bedtime as needed for muscle spasms. 06/21/23   Celestia Rosaline SQUIBB, NP  diclofenac Sodium (VOLTAREN) 1 % GEL Apply topically. 08/02/23   [provider]  empagliflozin  (JARDIANCE ) 25 MG TABS tablet Take 1 tablet (25 mg total) by mouth daily before breakfast. 07/03/24   Celestia Rosaline SQUIBB, NP  Glucose Blood (BLOOD GLUCOSE TEST STRIPS) STRP 1 each by In Vitro route in the morning, at noon, and at bedtime. May substitute to any manufacturer covered by patient's insurance. 03/20/24 12/15/24  Celestia Rosaline SQUIBB, NP  guaiFENesin -codeine  100-10 MG/5ML syrup Take 5 mLs by mouth at bedtime as needed for cough. 03/06/24   Bruning, Ashlyn, PA-C  insulin  regular (HUMULIN R ) 100 units/mL injection Inject 0.1 mLs (10 Units total) into the skin 2 (two) times daily before a meal. 12/26/23   Celestia Rosaline SQUIBB, NP  ketoconazole  (NIZORAL ) 2 % cream Apply 1 Application topically  daily. 06/05/24   Sikora, Rebecca, DPM  nicotine  (NICODERM CQ  - DOSED IN MG/24 HOURS) 14 mg/24hr patch Place 1 patch (14 mg total) onto the skin daily. 07/03/24   Celestia Rosaline SQUIBB, NP  nicotine  (NICODERM CQ ) 21 mg/24hr patch Place 1 patch (21 mg total) onto the skin daily. 07/03/24   Celestia Rosaline SQUIBB, NP  nicotine  (NICODERM CQ ) 7 mg/24hr patch Place 1 patch (7 mg total) onto the skin daily. 07/03/24   Celestia Rosaline SQUIBB, NP  omeprazole  (PRILOSEC) 20 MG capsule Take 1 capsule (20 mg total) by mouth daily. 06/21/23   Celestia Rosaline SQUIBB, NP  Semaglutide , 1 MG/DOSE, 4 MG/3ML SOPN Inject 1 mg as directed once a week. 07/03/24    Celestia Rosaline SQUIBB, NP  sildenafil  (VIAGRA ) 100 MG tablet TAKE ONE TABLET BY MOUTH AS INSTRUCTED (TAKE 1 HOUR PRIOR TO SEXUAL ACTIVITY *DO NOT EXCEED 1 DOSE PER 24 HOUR PERIOD*) 06/21/23   Celestia Rosaline SQUIBB, NP    Allergies: Metformin and related, Naproxen, and Oxycontin  [oxycodone ]    Review of Systems  Updated Vital Signs BP (!) 198/97 (BP Location: Right Arm)   Pulse 93   Temp 98.3 F (36.8 C) (Oral)   Resp (!) 26   SpO2 100%   Physical Exam Vitals and nursing note reviewed.  Constitutional:      Appearance: He is well-developed.  HENT:     Head: Normocephalic and atraumatic.  Eyes:     Pupils: Pupils are equal, round, and reactive to light.  Neck:     Vascular: No JVD.  Cardiovascular:     Rate and Rhythm: Normal rate and regular rhythm.     Heart sounds: No murmur heard.    No friction rub. No gallop.  Pulmonary:     Effort: No respiratory distress.     Breath sounds: No wheezing.     Comments: Diminished breath sounds in the left lower lung field Abdominal:     General: There is no distension.     Tenderness: There is no abdominal tenderness. There is no guarding or rebound.  Musculoskeletal:        General: Normal range of motion.     Cervical back: Normal range of motion and neck supple.     Right lower leg: Edema present.     Left lower leg: Edema present.     Comments: 1-2+ lower extremity edema up to the shins bilaterally.  Skin:    Coloration: Skin is not pale.     Findings: No rash.  Neurological:     Mental Status: He is alert and oriented to person, place, and time.  Psychiatric:        Behavior: Behavior normal.     (all labs ordered are listed, but only abnormal results are displayed) Labs Reviewed  BASIC METABOLIC PANEL WITH GFR - Abnormal; Notable for the following components:      Result Value   CO2 21 (*)    Glucose, Bld 117 (*)    Creatinine, Ser 1.50 (*)    GFR, Estimated 50 (*)    All other components within normal limits  CBC  WITH DIFFERENTIAL/PLATELET - Abnormal; Notable for the following components:   RDW 16.2 (*)    All other components within normal limits  BRAIN NATRIURETIC PEPTIDE - Abnormal; Notable for the following components:   B Natriuretic Peptide 888.4 (*)    All other components within normal limits  I-STAT CHEM 8, ED - Abnormal; Notable for the following components:  Creatinine, Ser 1.60 (*)    Glucose, Bld 116 (*)    All other components within normal limits  TROPONIN I (HIGH SENSITIVITY) - Abnormal; Notable for the following components:   Troponin I (High Sensitivity) 58 (*)    All other components within normal limits  TROPONIN I (HIGH SENSITIVITY) - Abnormal; Notable for the following components:   Troponin I (High Sensitivity) 54 (*)    All other components within normal limits    EKG: EKG Interpretation Date/Time:  Tuesday August 20 2024 13:42:47 EDT Ventricular Rate:  82 PR Interval:  158 QRS Duration:  88 QT Interval:  422 QTC Calculation: 493 R Axis:   80  Text Interpretation: Normal sinus rhythm Minimal voltage criteria for LVH, may be normal variant ( Sokolow-Lyon ) Nonspecific ST abnormality Prolonged QT Abnormal ECG Since last tracing rate slower Otherwise no significant change Confirmed by Emil Share [phone]) on 08/20/2024 8:22:06 PM  Radiology: CT Angio Chest PE W and/or Wo Contrast Result Date: 08/20/2024 EXAM: CTA CHEST 08/20/2024 09:32:35 PM TECHNIQUE: CTA of the chest was performed without and with the administration of 75 mL of intravenous contrast (iohexol  (OMNIPAQUE ) 350 MG/ML injection 75 mL IOHEXOL  350 MG/ML SOLN). Multiplanar reformatted images are provided for review. MIP images are provided for review. Automated exposure control, iterative reconstruction, and/or weight based adjustment of the mA/kV was utilized to reduce the radiation dose to as low as reasonably achievable. COMPARISON: CT of the chest 07/29/2024. CLINICAL HISTORY: Pulmonary embolism (PE)  suspected, high prob. History of non-small cell lung cancer. FINDINGS: PULMONARY ARTERIES: Pulmonary arteries are adequately opacified for evaluation. No acute pulmonary embolus. Main pulmonary artery is normal in caliber. MEDIASTINUM: The heart and pericardium demonstrate no acute abnormality. Right chest port-a-catheter tip ends in the right atrium. There are atherosclerotic calcifications of the coronary arteries. The aorta is mildly enlarged and demonstrates atherosclerotic calcifications. LYMPH NODES: No mediastinal, hilar or axillary lymphadenopathy. LUNGS AND PLEURA: Posterior right upper lobe mass like opacity is not significantly changed. There is obstruction of right upper lobe bronchus secondary to the mass with right upper lobe peribronchial wall thickening and plugging, unchanged from prior. Hazy opacity in the superior segment of the right upper lobe adjacent to this mass has not significantly changed. There is a band of atelectasis in the right lower lobe. There is a slightly nodular configuration along this band of opacity measuring 1 cm image 10/91 which is new from prior. No new focal consolidation or pulmonary edema. There is a small right pleural effusion which appears unchanged. No pneumothorax. UPPER ABDOMEN: There is a 2.5 cm right renal cyst. SOFT TISSUES AND BONES: No acute bone or soft tissue abnormality. IMPRESSION: 1. No evidence of pulmonary embolism. 2. Stable posterior right upper lobe mass-like opacity with unchanged adjacent opacity in the superior segment of the right upper lobe; unchanged obstruction of the right upper lobe bronchus with peribronchial thickening and mucus plugging. 3. New 1 cm nodular configuration along a band of right lower lobe atelectasis. Consider short-interval follow-up CT to assess for persistence or progression. Electronically signed by: Greig Pique MD 08/20/2024 09:43 PM EDT RP Workstation: [id number]   DG Chest 1 View Result Date: 08/20/2024 EXAM: 1  VIEW(S) XRAY OF THE CHEST 08/20/2024 02:56:00 PM COMPARISON: CT 07/29/2024 CLINICAL HISTORY: sob. Table formatting from the original note was not included.; Reason for exam: sob ; Per triage notes: ; Pt sent by [facility] from [place] [facility] for SOB when laying down. Good lung sounds. FINDINGS: LINES,  TUBES AND DEVICES: Right Port-A-Cath with tip over right atrium. LUNGS AND PLEURA: Right upper lobe and right lower lobe patchy airspace opacity. Small right pleural effusion with associated atelectasis or scarring. No pulmonary edema. No pneumothorax. HEART AND MEDIASTINUM: No acute abnormality of the cardiac and mediastinal silhouettes. BONES AND SOFT TISSUES: No acute osseous abnormality. IMPRESSION: 1. Small right pleural effusion with right lower lobe airspace opacities. Favor atelectasis though pneumonia is not excluded. 2. Right upper lobe airspace opacity similar to prior CT and possibly due to radiation change. Electronically signed by: Norman Gatlin MD 08/20/2024 03:11 PM EDT RP Workstation: [id number]     Procedures   Medications Ordered in the ED  iohexol  (OMNIPAQUE ) 350 MG/ML injection 75 mL (75 mLs Intravenous Contrast Given 08/20/24 2133)                                    Medical Decision Making Amount and/or Complexity of Data Reviewed Radiology: ordered.  Risk Prescription drug management.   68 yo M with a history of lung cancer comes in with a chief complaint of difficulty breathing.  Going on for 3 days.  Patient history with possible pneumonia cough subjective fevers.  Patient is quite tachypneic on initial exam.  Lung sounds clear.  Concern for possible PE.  Will obtain a CT angiogram of the chest.  Patient's BNP is elevated no history of heart failure.  Does have some mild edema to bilateral lower extremities.  No obvious JVD no rales.  CT angiogram of the chest without PE.  No obvious signs of heart failure.  Patient has known right sided lung cancer with what appeared to  be unchanged mucous plugging and effusion per radiology.  I discussed results with patient.  Will trial antibiotics for possible pneumonia.  Will have him follow-up with the [place].  Will return for worsening shortness of breath especially on exertion.  11:15 PM:  I have discussed the diagnosis/risks/treatment options with the patient.  Evaluation and diagnostic testing in the emergency department does not suggest an emergent condition requiring admission or immediate intervention beyond what has been performed at this time.  They will follow up with PCP. We also discussed returning to the ED immediately if new or worsening sx occur. We discussed the sx which are most concerning (e.g., sudden worsening pain, fever, inability to tolerate by mouth) that necessitate immediate return. Medications administered to the patient during their visit and any new prescriptions provided to the patient are listed below.  Medications given during this visit Medications  iohexol  (OMNIPAQUE ) 350 MG/ML injection 75 mL (75 mLs Intravenous Contrast Given 08/20/24 2133)     The patient appears reasonably screen and/or stabilized for discharge and I doubt any other medical condition or other [facility] requiring further screening, evaluation, or treatment in the ED at this time prior to discharge.       Final diagnoses:  SOB (shortness of breath)    ED Discharge Orders          Ordered    amoxicillin-clavulanate (AUGMENTIN) 875-125 MG tablet  Every 12 hours,   Status:  Discontinued        08/20/24 2155    azithromycin (ZITHROMAX) 250 MG tablet  Daily,   Status:  Discontinued        08/20/24 2155    amoxicillin-clavulanate (AUGMENTIN) 875-125 MG tablet  Every 12 hours        08/20/24  2209    azithromycin (ZITHROMAX) 250 MG tablet  Daily        08/20/24 2209               Emil Share, DO 08/20/24 2315

## 2024-08-20 NOTE — ED Provider Triage Note (Signed)
 Emergency Medicine Provider Triage Evaluation Note  Jaman Aro. , a 68 y.o. male  was evaluated in triage.  Pt complains of shortness of breath and orthopnea. Was sent in from Urgent Care at [facility]. Has a hx of lung cancer. States he gets very short of breath laying flat at night and has noticed some increased swelling in his legs.   Review of Systems  Positive: Sob, leg swelling Negative: Chest pain, nausea   Physical Exam  BP (!) 179/101 (BP Location: Right Arm)   Pulse 81   Temp 98.1 F (36.7 C)   Resp 18   SpO2 95%  Gen:   Awake, no distress   Resp:  Normal effort  MSK:   Moves all extremities without difficulty    Medical Decision Making  Medically screening exam initiated at 1:49 PM.  Appropriate orders placed.  Helayne Christopher Raddle. was informed that the remainder of the evaluation will be completed by another provider, this initial triage assessment does not replace that evaluation, and the importance of remaining in the ED until their evaluation is complete.     Gennaro Duwaine CROME, DO 08/20/24 1353

## 2024-08-20 NOTE — ED Triage Notes (Signed)
 Pt sent by [facility] from [place] [facility] for SOB when laying down. Concerned for CHF.   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  Good lung sounds. Breathing fine for EMS Supposed to be on meds he isnt taking and use a CPAP at night but isnt.   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   180/90 HR 68 100% RA, CBG 114

## 2024-08-20 NOTE — Discharge Instructions (Signed)
 I am not sure of the exact cause of your difficulty breathing.  I am going to start you on antibiotics like this might be pneumonia.  I think it is possible but I think it is less likely that it is heart failure.  Please follow-up with your family doctor.  They may want to get an ultrasound or refer you to a cardiologist.  As we discussed please return for any worsening.

## 2024-09-02 ENCOUNTER — Encounter (INDEPENDENT_AMBULATORY_CARE_PROVIDER_SITE_OTHER): Payer: No Typology Code available for payment source

## 2024-09-10 ENCOUNTER — Encounter (INDEPENDENT_AMBULATORY_CARE_PROVIDER_SITE_OTHER): Payer: Self-pay

## 2024-09-10 ENCOUNTER — Ambulatory Visit (INDEPENDENT_AMBULATORY_CARE_PROVIDER_SITE_OTHER)

## 2024-09-10 VITALS — Ht 66.0 in | Wt 233.0 lb

## 2024-09-10 DIAGNOSIS — Z0000 Encounter for general adult medical examination without abnormal findings: Secondary | ICD-10-CM

## 2024-09-10 NOTE — Patient Instructions (Signed)
 Mr. Kevin Lambert,  Thank you for taking the time for your Medicare Wellness Visit. I appreciate your continued commitment to your health goals. Please review the care plan we discussed, and feel free to reach out if I can assist you further.  Please note that Annual Wellness Visits do not include a physical exam. Some assessments may be limited, especially if the visit was conducted virtually. If needed, we may recommend an in-person follow-up with your provider.  Ongoing Care Seeing your primary care provider every 3 to 6 months helps us  monitor your health and provide consistent, personalized care. Last office visit on 07/03/2024.  You are due for a colonoscopy.  Referrals If a referral was made during today's visit and you haven't received any updates within two weeks, please contact the referred provider directly to check on the status.  Recommended Screenings:  Health Maintenance  Topic Date Due   Eye exam for diabetics  Never done   Colon Cancer Screening  Never done   COVID-19 Vaccine (3 - Pfizer risk series) 01/28/2021   Flu Shot  06/07/2024   Pneumococcal Vaccine for age over 75 (1 of 2 - PCV) 12/18/2024*   Yearly kidney health urinalysis for diabetes  12/18/2024   Hemoglobin A1C  01/03/2025   Complete foot exam   03/20/2025   Yearly kidney function blood test for diabetes  08/20/2025   Medicare Annual Wellness Visit  09/10/2025   DTaP/Tdap/Td vaccine (2 - Td or Tdap) 07/14/2027   Hepatitis C Screening  Completed   Zoster (Shingles) Vaccine  Completed   Meningitis B Vaccine  Aged Out   Hepatitis B Vaccine  Discontinued  *Topic was postponed. The date shown is not the original due date.       09/10/2024    8:19 AM  Advanced Directives  Does Patient Have a Medical Advance Directive? No  Would patient like information on creating a medical advance directive? No - Patient declined    Vision: Annual vision screenings are recommended for early detection of glaucoma, cataracts, and  diabetic retinopathy. These exams can also reveal signs of chronic conditions such as diabetes and high blood pressure.  Dental: Annual dental screenings help detect early signs of oral cancer, gum disease, and other conditions linked to overall health, including heart disease and diabetes.  Please see the attached documents for additional preventive care recommendations.

## 2024-09-10 NOTE — Progress Notes (Signed)
 Subjective:   Kevin Lambert. is a 68 y.o. male who presents for a Medicare Annual Wellness Visit.  Allergies (verified) Metformin and related, Naproxen, and Oxycontin  [oxycodone ]   History: Past Medical History:  Diagnosis Date   Arthritis    Chronic kidney disease    Depression    DM (diabetes mellitus) (HCC)    type II   Dyslipidemia    Dyspnea    unable to walk much - he thinks it is from    GERD (gastroesophageal reflux disease)    Hepatitis    Hepatitis C- treated   History of kidney stones    HTN (hypertension)    Lung cancer (HCC)    Neuropathy    nscl ca dx'd 10/2019   OSA on CPAP    PTSD (post-traumatic stress disorder)    Past Surgical History:  Procedure Laterality Date   BIOPSY OF MEDIASTINAL MASS  12/10/2019   Procedure: BIOPSY OF MEDIASTINAL MASS;  Surgeon: Brenna Adine CROME, DO;  [facility];  Service: Pulmonary;;  right upper lobe   BRONCHIAL NEEDLE ASPIRATION BIOPSY  12/10/2019   Procedure: BRONCHIAL NEEDLE ASPIRATION BIOPSIES;  Surgeon: Brenna Adine CROME, DO;  Location: [facility];  Service: Pulmonary;;   COLONOSCOPY     IR IMAGING GUIDED PORT INSERTION  12/31/2019   kidney stone     URETERAL STENT PLACEMENT     Ureteral Stent Removed     VIDEO BRONCHOSCOPY WITH ENDOBRONCHIAL ULTRASOUND N/A 12/10/2019   Procedure: VIDEO BRONCHOSCOPY WITH ENDOBRONCHIAL ULTRASOUND;  Surgeon: Brenna Adine CROME, DO;  Location: [facility];  Service: Pulmonary;  Laterality: N/A;   Family History  Problem Relation Age of Onset   Liver disease Mother    Diabetes Father    Diabetes Brother    Esophageal cancer Neg Hx    Colon cancer Neg Hx    Stomach cancer Neg Hx    Pancreatic cancer Neg Hx    Social History   Occupational History   Occupation: Oncologist: [id number]   Occupation: RETIRED  Tobacco Use   Smoking status: Some Days    Types: Cigars    Passive exposure: Past   Smokeless tobacco: Never   Tobacco comments:    05/23/22. : 1-2  Black & Milds / LW  Vaping Use   Vaping status: Never Used  Substance and Sexual Activity   Alcohol use: Yes    Alcohol/week: 6.0 standard drinks of alcohol    Types: 6 Cans of beer per week    Comment: occ   Drug use: Never   Sexual activity: Yes    Birth control/protection: None   Tobacco Counseling Ready to quit: Not Answered Counseling given: Not Answered Tobacco comments: 05/23/22. : 1-2 Black & Milds / LW  SDOH Screenings   Food Insecurity: No Food Insecurity (09/10/2024)  Housing: Unknown (09/10/2024)  Transportation Needs: No Transportation Needs (09/10/2024)  Utilities: Not At Risk (09/10/2024)  Alcohol Screen: Low Risk  (12/12/2023)  Depression (PHQ2-9): High Risk (09/10/2024)  Financial Resource Strain: Medium Risk (09/19/2023)  Physical Activity: Inactive (09/10/2024)  Social Connections: Unknown (09/10/2024)  Stress: Stress Concern Present (09/10/2024)  Tobacco Use: High Risk (09/10/2024)  Health Literacy: Adequate Health Literacy (09/10/2024)   Depression Screen    09/10/2024    8:29 AM 07/03/2024    2:15 PM 03/20/2024    2:32 PM 03/20/2024    2:18 PM 02/22/2024    2:17 PM 01/10/2024    2:58 PM 12/19/2023  2:03 PM  PHQ 2/9 Scores  PHQ - 2 Score 1 2 0 0 3 0 4  PHQ- 9 Score 12 9   10  15      Goals Addressed             This Visit's Progress    Patient Stated   On track    My goal is to get healthier. Recapture my health.        Visit info / Clinical Intake: Medicare Wellness Visit Mode:: Video If telephone or video:: unable to obtan vitals due to lack of equipment Interpreter Needed?: No AWV questionnaire completed by patient prior to visit?: no Living arrangements:: with family/others Patient's Overall Health Status Rating: good Typical amount of pain: none  Dietary Habits and Nutritional Risks How many meals a day?: (!) 1 Eats fruit and vegetables daily?: yes Most meals are obtained by: preparing own meals; having others provide food Diabetic:: (!)  yes Any non-healing wounds?: no Would you like to be referred to a Nutritionist or for Diabetic Management? : no  Functional Status Activities of Daily Living (to include ambulation/medication): Independent Ambulation: Independent with device- listed below Home Assistive Devices/Equipment: Eyeglasses Medication Administration: Independent Home Management: Independent Manage your own finances?: yes Primary transportation is: driving Concerns about vision?: (!) yes (has cataract-per pt) Concerns about hearing?: no  Fall Screening Falls in the past year?: 0 Number of falls in past year: 0 Was there an injury with Fall?: 0 Fall Risk Category Calculator: 0 Patient Fall Risk Level: Low Fall Risk  Fall Risk Patient at Risk for Falls Due to: No Fall Risks Fall risk Follow up: Falls evaluation completed; Falls prevention discussed  Home and Transportation Safety: All rugs have non-skid backing?: yes All stairs or steps have railings?: N/A, no stairs Grab bars in the bathtub or shower?: (!) no Have non-skid surface in bathtub or shower?: yes Good home lighting?: yes Regular seat belt use?: yes Hospital stays in the last year:: no  Cognitive Assessment Difficulty concentrating, remembering, or making decisions? : yes Will 6CIT or Mini Cog be Completed: yes What year is it?: 0 points What month is it?: 0 points Give patient an address phrase to remember (5 components): [address], [place] About what time is it?: 0 points Count backwards from 20 to 1: 0 points Say the months of the year in reverse: 0 points Repeat the address phrase from earlier: 0 points 6 CIT Score: 0 points  Advance Directives (For Healthcare) Does Patient Have a Medical Advance Directive?: No Would patient like information on creating a medical advance directive?: No - Patient declined  Reviewed/Updated  Reviewed/Updated: All        Objective:    Today's Vitals   09/10/24 0811  Weight: 233 lb  (105.7 kg)  Height: 5' 6 (1.676 m)   Body mass index is 37.61 kg/m.  Current Medications (verified) Outpatient Encounter Medications as of 09/10/2024  Medication Sig   amLODipine  (NORVASC ) 10 MG tablet Take 1 tablet (10 mg total) by mouth daily.   amoxicillin-clavulanate (AUGMENTIN) 875-125 MG tablet Take 1 tablet by mouth every 12 (twelve) hours.   atorvastatin  (LIPITOR) 80 MG tablet Take 1 tablet (80 mg total) by mouth daily.   azithromycin (ZITHROMAX) 250 MG tablet Take 1 tablet (250 mg total) by mouth daily. Take first 2 tablets together, then 1 every day until finished.   benzonatate  (TESSALON ) 200 MG capsule Take 1 capsule (200 mg total) by mouth 3 (three) times  daily as needed for cough.   blood glucose meter kit and supplies Dispense based on patient and insurance preference. Patient to check blood sugars daily, prior to eating and also as needed (FOR ICD-10 E10.9, E11.9).   Blood Glucose Monitoring Suppl DEVI 1 each by Does not apply route in the morning, at noon, and at bedtime. May substitute to any manufacturer covered by patient's insurance.   carvedilol  (COREG ) 3.125 MG tablet Take 1 tablet (3.125 mg total) by mouth 2 (two) times daily with a meal.   chlorthalidone  (HYGROTON ) 25 MG tablet Take 1 tablet (25 mg total) by mouth daily.   cyclobenzaprine  (FLEXERIL ) 10 MG tablet Take 1 tablet (10 mg total) by mouth at bedtime as needed for muscle spasms.   diclofenac Sodium (VOLTAREN) 1 % GEL Apply topically.   empagliflozin  (JARDIANCE ) 25 MG TABS tablet Take 1 tablet (25 mg total) by mouth daily before breakfast.   Glucose Blood (BLOOD GLUCOSE TEST STRIPS) STRP 1 each by In Vitro route in the morning, at noon, and at bedtime. May substitute to any manufacturer covered by patient's insurance.   guaiFENesin -codeine  100-10 MG/5ML syrup Take 5 mLs by mouth at bedtime as needed for cough.   insulin  regular (HUMULIN R ) 100 units/mL injection Inject 0.1 mLs (10 Units total) into the skin 2  (two) times daily before a meal.   ketoconazole  (NIZORAL ) 2 % cream Apply 1 Application topically daily.   nicotine  (NICODERM CQ  - DOSED IN MG/24 HOURS) 14 mg/24hr patch Place 1 patch (14 mg total) onto the skin daily.   nicotine  (NICODERM CQ ) 21 mg/24hr patch Place 1 patch (21 mg total) onto the skin daily.   nicotine  (NICODERM CQ ) 7 mg/24hr patch Place 1 patch (7 mg total) onto the skin daily.   omeprazole  (PRILOSEC) 20 MG capsule Take 1 capsule (20 mg total) by mouth daily.   Semaglutide , 1 MG/DOSE, 4 MG/3ML SOPN Inject 1 mg as directed once a week.   sildenafil  (VIAGRA ) 100 MG tablet TAKE ONE TABLET BY MOUTH AS INSTRUCTED (TAKE 1 HOUR PRIOR TO SEXUAL ACTIVITY *DO NOT EXCEED 1 DOSE PER 24 HOUR PERIOD*)   aspirin EC 81 MG tablet Take 81 mg by mouth daily. (Patient not taking: Reported on 09/10/2024)   No facility-administered encounter medications on file as of 09/10/2024.   Hearing/Vision screen Hearing Screening - Comments:: Denies hearing difficulties   Vision Screening - Comments:: Has cataract/[employer]/up to date-per pt Immunizations and Health Maintenance Health Maintenance  Topic Date Due   OPHTHALMOLOGY EXAM  Never done   Colonoscopy  Never done   COVID-19 Vaccine (3 - Pfizer risk series) 01/28/2021   Influenza Vaccine  06/07/2024   Medicare Annual Wellness (AWV)  08/27/2024   Pneumococcal Vaccine: 50+ Years (1 of 2 - PCV) 12/18/2024 (Originally 09/08/1975)   Diabetic kidney evaluation - Urine ACR  12/18/2024   HEMOGLOBIN A1C  01/03/2025   FOOT EXAM  03/20/2025   Diabetic kidney evaluation - eGFR measurement  08/20/2025   DTaP/Tdap/Td (2 - Td or Tdap) 07/14/2027   Hepatitis C Screening  Completed   Zoster Vaccines- Shingrix  Completed   Meningococcal B Vaccine  Aged Out   Hepatitis B Vaccines 19-59 Average Risk  Discontinued        Assessment/Plan:  This is a routine wellness examination for [facility].  Patient Care Team: Celestia Rosaline SQUIBB, NP as PCP - General (Internal  Medicine) Joya Stabs, DPM as Consulting Physician (Podiatry) Sherrod Sherrod, MD as Consulting Physician (Oncology) Burnetta Aures, MD  as Consulting Physician (Orthopedic Surgery) [place], [place], [place] as Referring Physician (Optometry)  I have personally reviewed and noted the following in the patient's chart:   Medical and social history Use of alcohol, tobacco or illicit drugs  Current medications and supplements including opioid prescriptions. Functional ability and status Nutritional status Physical activity Advanced directives List of other physicians Hospitalizations, surgeries, and ER visits in previous 12 months Vitals Screenings to include cognitive, depression, and falls Referrals and appointments  No orders of the defined types were placed in this encounter.  In addition, I have reviewed and discussed with patient certain preventive protocols, quality metrics, and best practice recommendations. A written personalized care plan for preventive services as well as general preventive health recommendations were provided to patient.   Dannielle Baskins L Olen Eaves, CMA   09/10/2024   No follow-ups on file.  After Visit Summary: (MyChart) Due to this being a telephonic visit, the after visit summary with patients personalized plan was offered to patient via MyChart   Nurse Notes: Patient is due for a colonoscopy.  He has had a diabetic eye exam recently with [facility], [facility] [[id number]]  [address] [place] [address].  I have sent a request for records out today.  Patient declines any due vaccines.  He also stated that he will be discontinuing care from this office, as he will be switching his primary care to the [facility].

## 2024-09-13 ENCOUNTER — Telehealth (INDEPENDENT_AMBULATORY_CARE_PROVIDER_SITE_OTHER): Payer: Self-pay

## 2024-09-13 NOTE — Telephone Encounter (Signed)
 Copied from CRM #[id number]. Topic: General - Other >> Sep 13, 2024  9:15 AM Kevin Lambert wrote: Reason for CRM: Patient would like the front desk to call him back.. said its personal wouldn't relay msg

## 2024-09-18 ENCOUNTER — Ambulatory Visit: Admitting: Podiatry

## 2024-09-27 ENCOUNTER — Inpatient Hospital Stay: Attending: Internal Medicine

## 2024-10-06 DIAGNOSIS — F17210 Nicotine dependence, cigarettes, uncomplicated: Secondary | ICD-10-CM | POA: Diagnosis not present

## 2024-10-06 DIAGNOSIS — F329 Major depressive disorder, single episode, unspecified: Secondary | ICD-10-CM | POA: Diagnosis not present

## 2024-10-06 DIAGNOSIS — G8929 Other chronic pain: Secondary | ICD-10-CM | POA: Diagnosis not present

## 2024-10-06 DIAGNOSIS — N1831 Chronic kidney disease, stage 3a: Secondary | ICD-10-CM | POA: Diagnosis not present

## 2024-10-07 ENCOUNTER — Ambulatory Visit (INDEPENDENT_AMBULATORY_CARE_PROVIDER_SITE_OTHER): Payer: Medicare (Managed Care) | Admitting: Primary Care

## 2024-10-07 ENCOUNTER — Inpatient Hospital Stay: Attending: Internal Medicine

## 2024-10-07 DIAGNOSIS — Z08 Encounter for follow-up examination after completed treatment for malignant neoplasm: Secondary | ICD-10-CM | POA: Insufficient documentation

## 2024-10-07 DIAGNOSIS — Z85118 Personal history of other malignant neoplasm of bronchus and lung: Secondary | ICD-10-CM | POA: Diagnosis present

## 2024-10-25 ENCOUNTER — Encounter: Payer: Self-pay | Admitting: Internal Medicine

## 2024-10-28 ENCOUNTER — Ambulatory Visit (HOSPITAL_COMMUNITY)
Admission: RE | Admit: 2024-10-28 | Discharge: 2024-10-28 | Disposition: A | Discharge status: Home / Self Care | Source: Ambulatory Visit | Attending: Internal Medicine | Admitting: Internal Medicine

## 2024-10-28 ENCOUNTER — Inpatient Hospital Stay

## 2024-10-28 ENCOUNTER — Other Ambulatory Visit: Payer: Self-pay | Admitting: Physician Assistant

## 2024-10-28 DIAGNOSIS — C3490 Malignant neoplasm of unspecified part of unspecified bronchus or lung: Secondary | ICD-10-CM | POA: Insufficient documentation

## 2024-10-28 DIAGNOSIS — Z08 Encounter for follow-up examination after completed treatment for malignant neoplasm: Secondary | ICD-10-CM | POA: Diagnosis not present

## 2024-10-28 LAB — CBC WITH DIFFERENTIAL (CANCER CENTER ONLY)
Abs Immature Granulocytes: 0.04 K/uL (ref 0.00–0.07)
Basophils Absolute: 0 K/uL (ref 0.0–0.1)
Basophils Relative: 1 %
Eosinophils Absolute: 0.2 K/uL (ref 0.0–0.5)
Eosinophils Relative: 5 %
HCT: 36.5 % — ABNORMAL LOW (ref 39.0–52.0)
Hemoglobin: 12.9 g/dL — ABNORMAL LOW (ref 13.0–17.0)
Immature Granulocytes: 1 %
Lymphocytes Relative: 34 %
Lymphs Abs: 1.3 K/uL (ref 0.7–4.0)
MCH: 31.3 pg (ref 26.0–34.0)
MCHC: 35.3 g/dL (ref 30.0–36.0)
MCV: 88.6 fL (ref 80.0–100.0)
Monocytes Absolute: 0.4 K/uL (ref 0.1–1.0)
Monocytes Relative: 10 %
Neutro Abs: 1.9 K/uL (ref 1.7–7.7)
Neutrophils Relative %: 49 %
Platelet Count: 153 K/uL (ref 150–400)
RBC: 4.12 MIL/uL — ABNORMAL LOW (ref 4.22–5.81)
RDW: 14.4 % (ref 11.5–15.5)
Smear Review: NORMAL
WBC Count: 3.8 K/uL — ABNORMAL LOW (ref 4.0–10.5)
nRBC: 0 % (ref 0.0–0.2)

## 2024-10-28 LAB — CMP (CANCER CENTER ONLY)
ALT: 31 U/L (ref 0–44)
AST: 32 U/L (ref 15–41)
Albumin: 4 g/dL (ref 3.5–5.0)
Alkaline Phosphatase: 57 U/L (ref 38–126)
Anion gap: 10 (ref 5–15)
BUN: 20 mg/dL (ref 8–23)
CO2: 28 mmol/L (ref 22–32)
Calcium: 9.1 mg/dL (ref 8.9–10.3)
Chloride: 98 mmol/L (ref 98–111)
Creatinine: 1.83 mg/dL — ABNORMAL HIGH (ref 0.61–1.24)
GFR, Estimated: 39 mL/min — ABNORMAL LOW
Glucose, Bld: 115 mg/dL — ABNORMAL HIGH (ref 70–99)
Potassium: 3.7 mmol/L (ref 3.5–5.1)
Sodium: 135 mmol/L (ref 135–145)
Total Bilirubin: 0.3 mg/dL (ref 0.0–1.2)
Total Protein: 6.8 g/dL (ref 6.5–8.1)

## 2024-11-04 ENCOUNTER — Inpatient Hospital Stay

## 2024-11-04 ENCOUNTER — Inpatient Hospital Stay (HOSPITAL_BASED_OUTPATIENT_CLINIC_OR_DEPARTMENT_OTHER): Admitting: Internal Medicine

## 2024-11-04 VITALS — BP 136/72 | HR 75 | Temp 97.6°F | Resp 17 | Ht 66.0 in | Wt 226.0 lb

## 2024-11-04 DIAGNOSIS — C3490 Malignant neoplasm of unspecified part of unspecified bronchus or lung: Secondary | ICD-10-CM

## 2024-11-04 DIAGNOSIS — Z08 Encounter for follow-up examination after completed treatment for malignant neoplasm: Secondary | ICD-10-CM | POA: Diagnosis not present

## 2024-11-04 DIAGNOSIS — C3491 Malignant neoplasm of unspecified part of right bronchus or lung: Secondary | ICD-10-CM | POA: Diagnosis not present

## 2024-11-04 NOTE — Progress Notes (Signed)
 "     [facility] [phone]   [phone]  OFFICE PROGRESS NOTE  Kevin Rosaline SQUIBB, NP [address] [place] [address]  DIAGNOSIS: Stage IV (T2b, N3, M1a) non-small cell lung cancer, adenocarcinoma presented with large right upper lobe lung mass in addition to mediastinal and right supraclavicular lymphadenopathy as well as bilateral pulmonary nodules diagnosed in February 2021.  MOLECULAR STUDY by [employer]:  KRASG12C, 4.3%, Binimetinib  [id number], 0.9%, Niraparib, Olaparib, Rucaparib,Talazoparib, Tazemetostat  [id number], 1.7%, None   PRIOR THERAPY:  1) Systemic chemotherapy with carboplatin  for AUC of 5, Alimta  500 mg/M2 and Keytruda  200 mg IV every 3 weeks.  First dose December 31, 2019.  Status post 31 cycles.  The last few cycles he has been treated with single agent Keytruda  secondary to renal insufficiency.  This treatment was discontinued secondary to immunotherapy mediated colitis and diarrhea 2) status post palliative radiotherapy with SBRT to the enlarging right upper lobe lung nodule completed on January 12, 2024.  CURRENT THERAPY: Observation.  INTERVAL HISTORY: Kevin Lambert. 68 y.o. male returns to the clinic today for follow-up visit. Discussed the use of AI scribe software for clinical note transcription with the patient, who gave verbal consent to proceed.  History of Present Illness Kevin Lambert. is a 68 year old male with stage IV KRAS G12C-mutated non-small cell lung adenocarcinoma who presents for routine follow-up and restaging imaging.  He was diagnosed with stage IV non-small cell lung adenocarcinoma of the right lung in February 2021 and initially received carboplatin , pemetrexed , and pembrolizumab , followed by maintenance pembrolizumab  for 31 cycles. Maintenance immunotherapy was discontinued due to immunotherapy-mediated colitis and diarrhea. He has been under observation for over two years since  discontinuation of therapy. In January 2025, he underwent radiation therapy to the right lung mass. Disease status has been monitored with serial imaging, and a repeat CT chest was performed last week for restaging.  He currently denies new or worsening respiratory symptoms.  He experienced a severe episode of influenza approximately one week before Christmas, from which he is now recovering without ongoing symptoms. No other acute complaints or changes in health status have occurred since the last visit.    MEDICAL HISTORY: Past Medical History:  Diagnosis Date   Arthritis    Chronic kidney disease    Depression    DM (diabetes mellitus) (HCC)    type II   Dyslipidemia    Dyspnea    unable to walk much - he thinks it is from    GERD (gastroesophageal reflux disease)    Hepatitis    Hepatitis C- treated   History of kidney stones    HTN (hypertension)    Lung cancer (HCC)    Neuropathy    nscl ca dx'd 10/2019   OSA on CPAP    PTSD (post-traumatic stress disorder)     ALLERGIES:  is allergic to metformin and related, naproxen, and oxycontin  [oxycodone ].  MEDICATIONS:  Current Outpatient Medications  Medication Sig Dispense Refill   amLODipine  (NORVASC ) 10 MG tablet Take 1 tablet (10 mg total) by mouth daily. 90 tablet 1   amoxicillin -clavulanate (AUGMENTIN ) 875-125 MG tablet Take 1 tablet by mouth every 12 (twelve) hours. 14 tablet 0   aspirin EC 81 MG tablet Take 81 mg by mouth daily. (Patient not taking: Reported on 09/10/2024)     atorvastatin  (LIPITOR) 80 MG tablet Take 1 tablet (80 mg total) by mouth daily. 90 tablet 1   azithromycin  (ZITHROMAX ) 250  MG tablet Take 1 tablet (250 mg total) by mouth daily. Take first 2 tablets together, then 1 every day until finished. 6 tablet 0   benzonatate  (TESSALON ) 200 MG capsule Take 1 capsule (200 mg total) by mouth 3 (three) times daily as needed for cough. 30 capsule 1   blood glucose meter kit and supplies Dispense based on  patient and insurance preference. Patient to check blood sugars daily, prior to eating and also as needed (FOR ICD-10 E10.9, E11.9). 1 each 0   Blood Glucose Monitoring Suppl DEVI 1 each by Does not apply route in the morning, at noon, and at bedtime. May substitute to any manufacturer covered by patient's insurance. 1 each 0   carvedilol  (COREG ) 3.125 MG tablet Take 1 tablet (3.125 mg total) by mouth 2 (two) times daily with a meal. 180 tablet 1   chlorthalidone  (HYGROTON ) 25 MG tablet Take 1 tablet (25 mg total) by mouth daily. 90 tablet 1   cyclobenzaprine  (FLEXERIL ) 10 MG tablet Take 1 tablet (10 mg total) by mouth at bedtime as needed for muscle spasms. 30 tablet 2   diclofenac Sodium (VOLTAREN) 1 % GEL Apply topically.     empagliflozin  (JARDIANCE ) 25 MG TABS tablet Take 1 tablet (25 mg total) by mouth daily before breakfast. 90 tablet 1   Glucose Blood (BLOOD GLUCOSE TEST STRIPS) STRP 1 each by In Vitro route in the morning, at noon, and at bedtime. May substitute to any manufacturer covered by patient's insurance. 270 each 2   guaiFENesin -codeine  100-10 MG/5ML syrup Take 5 mLs by mouth at bedtime as needed for cough. 118 mL 1   insulin  regular (HUMULIN R ) 100 units/mL injection Inject 0.1 mLs (10 Units total) into the skin 2 (two) times daily before a meal. 10 mL 11   ketoconazole  (NIZORAL ) 2 % cream Apply 1 Application topically daily. 60 g 2   nicotine  (NICODERM CQ  - DOSED IN MG/24 HOURS) 14 mg/24hr patch Place 1 patch (14 mg total) onto the skin daily. 28 patch 0   nicotine  (NICODERM CQ ) 21 mg/24hr patch Place 1 patch (21 mg total) onto the skin daily. 21 patch 0   nicotine  (NICODERM CQ ) 7 mg/24hr patch Place 1 patch (7 mg total) onto the skin daily. 28 patch 0   omeprazole  (PRILOSEC) 20 MG capsule Take 1 capsule (20 mg total) by mouth daily. 90 capsule 1   Semaglutide , 1 MG/DOSE, 4 MG/3ML SOPN Inject 1 mg as directed once a week. 3 mL 1   sildenafil  (VIAGRA ) 100 MG tablet TAKE ONE TABLET  BY MOUTH AS INSTRUCTED (TAKE 1 HOUR PRIOR TO SEXUAL ACTIVITY *DO NOT EXCEED 1 DOSE PER 24 HOUR PERIOD*) 10 tablet 5   No current facility-administered medications for this visit.    SURGICAL HISTORY:  Past Surgical History:  Procedure Laterality Date   BIOPSY OF MEDIASTINAL MASS  12/10/2019   Procedure: BIOPSY OF MEDIASTINAL MASS;  Surgeon: Brenna Adine CROME, DO;  Location: [facility];  Service: Pulmonary;;  right upper lobe   BRONCHIAL NEEDLE ASPIRATION BIOPSY  12/10/2019   Procedure: BRONCHIAL NEEDLE ASPIRATION BIOPSIES;  Surgeon: Brenna Adine CROME, DO;  Location: [facility];  Service: Pulmonary;;   COLONOSCOPY     IR IMAGING GUIDED PORT INSERTION  12/31/2019   kidney stone     URETERAL STENT PLACEMENT     Ureteral Stent Removed     VIDEO BRONCHOSCOPY WITH ENDOBRONCHIAL ULTRASOUND N/A 12/10/2019   Procedure: VIDEO BRONCHOSCOPY WITH ENDOBRONCHIAL ULTRASOUND;  Surgeon: Brenna Adine CROME,  DO;  Location: [facility];  Service: Pulmonary;  Laterality: N/A;    REVIEW OF SYSTEMS:  Constitutional: positive for fatigue Eyes: negative Ears, nose, mouth, throat, and face: negative Respiratory: positive for cough and dyspnea on exertion Cardiovascular: negative Gastrointestinal: negative Genitourinary:negative Integument/breast: negative Hematologic/lymphatic: negative Musculoskeletal:negative Neurological: negative Behavioral/Psych: negative Endocrine: negative Allergic/Immunologic: negative   PHYSICAL EXAMINATION: General appearance: alert, cooperative, fatigued, and no distress Head: Normocephalic, without obvious abnormality, atraumatic Neck: no adenopathy, no JVD, supple, symmetrical, trachea midline, and thyroid  not enlarged, symmetric, no tenderness/mass/nodules Lymph nodes: Cervical, supraclavicular, and axillary nodes normal. Resp: clear to auscultation bilaterally Back: symmetric, no curvature. ROM normal. No CVA tenderness. Cardio: regular rate and rhythm, S1, S2 normal, no  murmur, click, rub or gallop GI: soft, non-tender; bowel sounds normal; no masses,  no organomegaly Extremities: extremities normal, atraumatic, no cyanosis or edema Neurologic: Alert and oriented X 3, normal strength and tone. Normal symmetric reflexes. Normal coordination and gait  ECOG PERFORMANCE STATUS: 1 - Symptomatic but completely ambulatory  Blood pressure 136/72, pulse 75, temperature 97.6 F (36.4 C), temperature source Temporal, resp. rate 17, height 5' 6 (1.676 m), weight 226 lb (102.5 kg), SpO2 95%.  LABORATORY DATA: Lab Results  Component Value Date   WBC 3.8 (L) 10/28/2024   HGB 12.9 (L) 10/28/2024   HCT 36.5 (L) 10/28/2024   MCV 88.6 10/28/2024   PLT 153 10/28/2024      Chemistry      Component Value Date/Time   NA 135 10/28/2024 1249   NA 137 07/03/2024 0000   K 3.7 10/28/2024 1249   CL 98 10/28/2024 1249   CO2 28 10/28/2024 1249   BUN 20 10/28/2024 1249   BUN 21 07/03/2024 0000   CREATININE 1.83 (H) 10/28/2024 1249      Component Value Date/Time   CALCIUM  9.1 10/28/2024 1249   ALKPHOS 57 10/28/2024 1249   AST 32 10/28/2024 1249   ALT 31 10/28/2024 1249   BILITOT 0.3 10/28/2024 1249       RADIOGRAPHIC STUDIES: No results found.   ASSESSMENT AND PLAN: This is a very pleasant 68 years old African-American male recently diagnosed with a stage IV non-small cell lung cancer, adenocarcinoma with positive KRAS G12C mutation presented with large right lower lobe lung mass in addition to mediastinal and right supraclavicular lymphadenopathy as well as bilateral pulmonary nodules diagnosed in February 2021. I explained to the patient that he has incurable condition and all the treatment options will be of palliative nature. The patient has no actionable mutations on the recent molecular studies and he is not a candidate for the [facility] clinical trial. He started systemic chemotherapy with carboplatin , Alimta  and Keytruda  status post 31 cycles.  Starting from  cycle #5 the patient is on maintenance treatment with Alimta  and Keytruda  every 3 weeks.  He has been recently on single agent Keytruda  for several cycles secondary to renal insufficiency. His treatment was discontinued secondary to immunotherapy mediated colitis with diarrhea. He also received SBRT to enlarging right upper lobe lung nodule completed in March 2025. The patient is currently on observation. He had repeat CT scan of the chest without contrast performed recently.  Unfortunately the final report is still pending but I personally and independently reviewed the scan images and discussed the result with the patient today.  I do not see any concerning findings for disease progression but may be slight decrease in the size of the right upper lobe lung mass after radiation. Assessment and Plan Assessment &  Plan Stage IV non-small cell lung adenocarcinoma of the right lung with KRAS G12C mutation Stage IV non-small cell lung adenocarcinoma of the right lung with KRAS G12C mutation. Preliminary review of recent chest CT demonstrates no significant interval changes; possible slight increase in right lung mass size remains unconfirmed. Prior therapies include systemic chemotherapy, maintenance immunotherapy (discontinued due to toxicity), and right lung radiation in January 2025. Ongoing surveillance is indicated pending final radiology interpretation. - Reviewed recent chest CT; awaiting official report. - Planned follow-up in six months for routine surveillance. - Advised earlier follow-up if imaging reveals concerning findings. - Discussed transfer of care and scan scheduling if he relocates to [facility] . - Agreed to notify him if scan results are concerning.  Immunotherapy-mediated colitis, resolved The patient was advised to call immediately if he has any concerning symptoms in the interval.  The patient voices understanding of current disease status and treatment options and  is in agreement with the current care plan.  All questions were answered. The patient knows to call the clinic with any problems, questions or concerns. We can certainly see the patient much sooner if necessary. The total time spent in the appointment was 30 minutes.  Disclaimer: This note was dictated with voice recognition software. Similar sounding words can inadvertently be transcribed and may not be corrected upon review.        "

## 2024-11-05 ENCOUNTER — Telehealth: Payer: Self-pay | Admitting: Internal Medicine

## 2024-11-05 NOTE — Telephone Encounter (Signed)
 Scheduled patient for next appointments in 6 months. Called and spoke with the patient, he is aware.

## 2024-11-18 ENCOUNTER — Telehealth: Payer: Self-pay | Admitting: *Deleted

## 2024-11-19 ENCOUNTER — Encounter: Payer: Self-pay | Admitting: Internal Medicine

## 2024-11-19 ENCOUNTER — Telehealth: Payer: Self-pay | Admitting: Medical Oncology

## 2024-11-19 NOTE — Telephone Encounter (Signed)
 done

## 2024-11-19 NOTE — Telephone Encounter (Signed)
 Pt notified that Dr. Sherrod said his scan was good with decrease in the size of the lung mass. No new spots.   Pt said he is moving in 3 weeks back to [place] his hometown. He will contact the [facility] for a referral to oncologist in [place].

## 2025-04-28 ENCOUNTER — Inpatient Hospital Stay: Attending: Internal Medicine

## 2025-05-06 ENCOUNTER — Inpatient Hospital Stay: Admitting: Internal Medicine
